# Patient Record
Sex: Female | Born: 1950 | Race: White | Hispanic: No | Smoking: Former smoker
Health system: Southern US, Community
[De-identification: ages and names within clinical notes are randomized; demographics above are authoritative.]

## PROBLEM LIST (undated history)

## (undated) DIAGNOSIS — Z8719 Personal history of other diseases of the digestive system: Secondary | ICD-10-CM

## (undated) DIAGNOSIS — K219 Gastro-esophageal reflux disease without esophagitis: Secondary | ICD-10-CM

## (undated) DIAGNOSIS — J449 Chronic obstructive pulmonary disease, unspecified: Secondary | ICD-10-CM

## (undated) DIAGNOSIS — K2970 Gastritis, unspecified, without bleeding: Secondary | ICD-10-CM

## (undated) DIAGNOSIS — F419 Anxiety disorder, unspecified: Secondary | ICD-10-CM

## (undated) DIAGNOSIS — M1990 Unspecified osteoarthritis, unspecified site: Secondary | ICD-10-CM

## (undated) DIAGNOSIS — G35 Multiple sclerosis: Secondary | ICD-10-CM

## (undated) DIAGNOSIS — G709 Myoneural disorder, unspecified: Secondary | ICD-10-CM

## (undated) DIAGNOSIS — T39395A Adverse effect of other nonsteroidal anti-inflammatory drugs [NSAID], initial encounter: Secondary | ICD-10-CM

## (undated) DIAGNOSIS — E669 Obesity, unspecified: Secondary | ICD-10-CM

## (undated) DIAGNOSIS — I2510 Atherosclerotic heart disease of native coronary artery without angina pectoris: Secondary | ICD-10-CM

## (undated) DIAGNOSIS — E785 Hyperlipidemia, unspecified: Secondary | ICD-10-CM

## (undated) DIAGNOSIS — R490 Dysphonia: Secondary | ICD-10-CM

## (undated) DIAGNOSIS — I714 Abdominal aortic aneurysm, without rupture, unspecified: Secondary | ICD-10-CM

## (undated) DIAGNOSIS — K635 Polyp of colon: Secondary | ICD-10-CM

## (undated) DIAGNOSIS — I4510 Unspecified right bundle-branch block: Secondary | ICD-10-CM

## (undated) DIAGNOSIS — E039 Hypothyroidism, unspecified: Secondary | ICD-10-CM

## (undated) DIAGNOSIS — K259 Gastric ulcer, unspecified as acute or chronic, without hemorrhage or perforation: Secondary | ICD-10-CM

## (undated) DIAGNOSIS — I10 Essential (primary) hypertension: Secondary | ICD-10-CM

## (undated) HISTORY — DX: Dysphonia: R49.0

## (undated) HISTORY — DX: Abdominal aortic aneurysm, without rupture: I71.4

## (undated) HISTORY — DX: Adverse effect of other nonsteroidal anti-inflammatory drugs (NSAID), initial encounter: T39.395A

## (undated) HISTORY — DX: Abdominal aortic aneurysm, without rupture, unspecified: I71.40

## (undated) HISTORY — DX: Gastritis, unspecified, without bleeding: K29.70

## (undated) HISTORY — DX: Hypothyroidism, unspecified: E03.9

## (undated) HISTORY — DX: Polyp of colon: K63.5

## (undated) HISTORY — DX: Multiple sclerosis: G35

## (undated) HISTORY — PX: ABDOMINAL HYSTERECTOMY: SHX81

## (undated) HISTORY — DX: Atherosclerotic heart disease of native coronary artery without angina pectoris: I25.10

## (undated) HISTORY — DX: Essential (primary) hypertension: I10

## (undated) HISTORY — PX: TONSILLECTOMY: SUR1361

## (undated) HISTORY — PX: CATARACT EXTRACTION, BILATERAL: SHX1313

## (undated) HISTORY — PX: CHOLECYSTECTOMY: SHX55

## (undated) HISTORY — DX: Obesity, unspecified: E66.9

## (undated) HISTORY — DX: Hyperlipidemia, unspecified: E78.5

## (undated) HISTORY — PX: CARPAL TUNNEL RELEASE: SHX101

## (undated) HISTORY — DX: Chronic obstructive pulmonary disease, unspecified: J44.9

## (undated) HISTORY — DX: Gastric ulcer, unspecified as acute or chronic, without hemorrhage or perforation: K25.9

---

## 2000-04-30 ENCOUNTER — Ambulatory Visit (HOSPITAL_COMMUNITY): Admission: RE | Admit: 2000-04-30 | Discharge: 2000-04-30 | Payer: Self-pay | Admitting: Gastroenterology

## 2001-04-30 ENCOUNTER — Ambulatory Visit (HOSPITAL_BASED_OUTPATIENT_CLINIC_OR_DEPARTMENT_OTHER): Admission: RE | Admit: 2001-04-30 | Discharge: 2001-04-30 | Payer: Self-pay | Admitting: Orthopedic Surgery

## 2001-05-21 ENCOUNTER — Ambulatory Visit (HOSPITAL_BASED_OUTPATIENT_CLINIC_OR_DEPARTMENT_OTHER): Admission: RE | Admit: 2001-05-21 | Discharge: 2001-05-21 | Payer: Self-pay | Admitting: Orthopedic Surgery

## 2002-02-12 ENCOUNTER — Encounter: Payer: Self-pay | Admitting: Family Medicine

## 2002-02-12 ENCOUNTER — Encounter: Admission: RE | Admit: 2002-02-12 | Discharge: 2002-02-12 | Payer: Self-pay | Admitting: Family Medicine

## 2002-03-14 ENCOUNTER — Other Ambulatory Visit: Admission: RE | Admit: 2002-03-14 | Discharge: 2002-03-14 | Payer: Self-pay | Admitting: Family Medicine

## 2002-12-02 ENCOUNTER — Encounter: Payer: Self-pay | Admitting: Family Medicine

## 2002-12-02 ENCOUNTER — Encounter: Admission: RE | Admit: 2002-12-02 | Discharge: 2002-12-02 | Payer: Self-pay | Admitting: Family Medicine

## 2003-07-25 ENCOUNTER — Emergency Department (HOSPITAL_COMMUNITY): Admission: EM | Admit: 2003-07-25 | Discharge: 2003-07-25 | Payer: Self-pay | Admitting: Family Medicine

## 2003-08-16 ENCOUNTER — Emergency Department (HOSPITAL_COMMUNITY): Admission: AD | Admit: 2003-08-16 | Discharge: 2003-08-16 | Payer: Self-pay | Admitting: Family Medicine

## 2004-04-10 ENCOUNTER — Encounter: Admission: RE | Admit: 2004-04-10 | Discharge: 2004-04-10 | Payer: Self-pay | Admitting: Neurology

## 2004-04-27 ENCOUNTER — Ambulatory Visit (HOSPITAL_COMMUNITY): Admission: RE | Admit: 2004-04-27 | Discharge: 2004-04-27 | Payer: Self-pay | Admitting: Neurology

## 2004-05-20 ENCOUNTER — Other Ambulatory Visit: Admission: RE | Admit: 2004-05-20 | Discharge: 2004-05-20 | Payer: Self-pay | Admitting: Family Medicine

## 2005-02-25 ENCOUNTER — Emergency Department (HOSPITAL_COMMUNITY): Admission: EM | Admit: 2005-02-25 | Discharge: 2005-02-25 | Payer: Self-pay | Admitting: Emergency Medicine

## 2005-09-29 ENCOUNTER — Other Ambulatory Visit: Admission: RE | Admit: 2005-09-29 | Discharge: 2005-09-29 | Payer: Self-pay | Admitting: Family Medicine

## 2006-07-18 LAB — CONVERTED CEMR LAB: Pap Smear: NORMAL

## 2007-06-25 DIAGNOSIS — Z860100 Personal history of colon polyps, unspecified: Secondary | ICD-10-CM | POA: Insufficient documentation

## 2007-06-25 DIAGNOSIS — F418 Other specified anxiety disorders: Secondary | ICD-10-CM

## 2007-06-25 DIAGNOSIS — K259 Gastric ulcer, unspecified as acute or chronic, without hemorrhage or perforation: Secondary | ICD-10-CM | POA: Insufficient documentation

## 2007-06-25 DIAGNOSIS — D126 Benign neoplasm of colon, unspecified: Secondary | ICD-10-CM | POA: Insufficient documentation

## 2007-06-25 DIAGNOSIS — E785 Hyperlipidemia, unspecified: Secondary | ICD-10-CM | POA: Insufficient documentation

## 2007-06-26 ENCOUNTER — Ambulatory Visit: Payer: Self-pay | Admitting: Internal Medicine

## 2007-06-26 DIAGNOSIS — Z87891 Personal history of nicotine dependence: Secondary | ICD-10-CM

## 2007-06-26 DIAGNOSIS — R0989 Other specified symptoms and signs involving the circulatory and respiratory systems: Secondary | ICD-10-CM

## 2007-06-26 DIAGNOSIS — J309 Allergic rhinitis, unspecified: Secondary | ICD-10-CM

## 2007-06-26 DIAGNOSIS — R498 Other voice and resonance disorders: Secondary | ICD-10-CM

## 2007-06-26 DIAGNOSIS — R0609 Other forms of dyspnea: Secondary | ICD-10-CM | POA: Insufficient documentation

## 2007-06-26 DIAGNOSIS — R05 Cough: Secondary | ICD-10-CM

## 2007-06-26 DIAGNOSIS — R062 Wheezing: Secondary | ICD-10-CM

## 2007-07-23 ENCOUNTER — Ambulatory Visit: Payer: Self-pay | Admitting: Internal Medicine

## 2007-07-23 DIAGNOSIS — J209 Acute bronchitis, unspecified: Secondary | ICD-10-CM

## 2007-07-23 DIAGNOSIS — J440 Chronic obstructive pulmonary disease with acute lower respiratory infection: Secondary | ICD-10-CM

## 2007-07-23 DIAGNOSIS — R942 Abnormal results of pulmonary function studies: Secondary | ICD-10-CM | POA: Insufficient documentation

## 2007-07-23 LAB — CONVERTED CEMR LAB: A-1 Antitrypsin, Ser: 184 mg/dL (ref 83–200)

## 2007-08-01 ENCOUNTER — Telehealth (INDEPENDENT_AMBULATORY_CARE_PROVIDER_SITE_OTHER): Payer: Self-pay | Admitting: *Deleted

## 2007-08-26 ENCOUNTER — Ambulatory Visit: Payer: Self-pay | Admitting: Internal Medicine

## 2007-08-28 ENCOUNTER — Encounter: Payer: Self-pay | Admitting: Internal Medicine

## 2007-10-02 ENCOUNTER — Ambulatory Visit: Payer: Self-pay | Admitting: Internal Medicine

## 2007-11-26 ENCOUNTER — Ambulatory Visit: Payer: Self-pay | Admitting: Internal Medicine

## 2007-12-30 ENCOUNTER — Ambulatory Visit: Payer: Self-pay | Admitting: Internal Medicine

## 2008-01-27 ENCOUNTER — Ambulatory Visit: Payer: Self-pay | Admitting: Internal Medicine

## 2008-02-05 ENCOUNTER — Encounter: Payer: Self-pay | Admitting: Internal Medicine

## 2008-02-26 ENCOUNTER — Encounter: Payer: Self-pay | Admitting: Family Medicine

## 2008-02-26 ENCOUNTER — Ambulatory Visit (HOSPITAL_COMMUNITY): Admission: RE | Admit: 2008-02-26 | Discharge: 2008-02-26 | Payer: Self-pay | Admitting: Surgery

## 2008-02-27 ENCOUNTER — Ambulatory Visit (HOSPITAL_COMMUNITY): Admission: RE | Admit: 2008-02-27 | Discharge: 2008-02-27 | Payer: Self-pay | Admitting: Surgery

## 2008-02-27 ENCOUNTER — Encounter: Payer: Self-pay | Admitting: Family Medicine

## 2008-03-07 ENCOUNTER — Ambulatory Visit (HOSPITAL_BASED_OUTPATIENT_CLINIC_OR_DEPARTMENT_OTHER): Admission: RE | Admit: 2008-03-07 | Discharge: 2008-03-07 | Payer: Self-pay | Admitting: Surgery

## 2008-03-12 ENCOUNTER — Encounter: Admission: RE | Admit: 2008-03-12 | Discharge: 2008-03-12 | Payer: Self-pay | Admitting: Surgery

## 2008-03-14 ENCOUNTER — Ambulatory Visit: Payer: Self-pay | Admitting: Internal Medicine

## 2008-05-08 ENCOUNTER — Encounter: Payer: Self-pay | Admitting: Family Medicine

## 2008-06-22 ENCOUNTER — Encounter: Admission: RE | Admit: 2008-06-22 | Discharge: 2008-06-22 | Payer: Self-pay | Admitting: Family Medicine

## 2008-07-20 ENCOUNTER — Ambulatory Visit: Payer: Self-pay | Admitting: Family Medicine

## 2008-07-20 DIAGNOSIS — K449 Diaphragmatic hernia without obstruction or gangrene: Secondary | ICD-10-CM | POA: Insufficient documentation

## 2008-07-20 DIAGNOSIS — E039 Hypothyroidism, unspecified: Secondary | ICD-10-CM

## 2008-08-20 ENCOUNTER — Encounter: Admission: RE | Admit: 2008-08-20 | Discharge: 2008-11-18 | Payer: Self-pay | Admitting: Surgery

## 2008-09-03 ENCOUNTER — Encounter: Payer: Self-pay | Admitting: Family Medicine

## 2008-09-07 ENCOUNTER — Ambulatory Visit (HOSPITAL_COMMUNITY): Admission: RE | Admit: 2008-09-07 | Discharge: 2008-09-08 | Payer: Self-pay | Admitting: Surgery

## 2008-09-07 ENCOUNTER — Encounter: Payer: Self-pay | Admitting: Family Medicine

## 2008-09-07 HISTORY — PX: BARIATRIC SURGERY: SHX1103

## 2008-09-07 HISTORY — PX: HIATAL HERNIA REPAIR: SHX195

## 2008-09-15 ENCOUNTER — Ambulatory Visit: Payer: Self-pay | Admitting: Family Medicine

## 2008-09-15 DIAGNOSIS — M25519 Pain in unspecified shoulder: Secondary | ICD-10-CM

## 2008-09-15 DIAGNOSIS — Z9884 Bariatric surgery status: Secondary | ICD-10-CM

## 2008-09-15 DIAGNOSIS — D696 Thrombocytopenia, unspecified: Secondary | ICD-10-CM

## 2008-09-15 LAB — CONVERTED CEMR LAB
Basophils Absolute: 0.1 10*3/uL (ref 0.0–0.1)
Basophils Relative: 0.7 % (ref 0.0–3.0)
Eosinophils Absolute: 0.3 10*3/uL (ref 0.0–0.7)
Eosinophils Relative: 3.3 % (ref 0.0–5.0)
Free T4: 1 ng/dL (ref 0.6–1.6)
HCT: 41.4 % (ref 36.0–46.0)
Hemoglobin: 14.6 g/dL (ref 12.0–15.0)
Lymphocytes Relative: 31.9 % (ref 12.0–46.0)
Lymphs Abs: 2.6 10*3/uL (ref 0.7–4.0)
MCHC: 35.3 g/dL (ref 30.0–36.0)
MCV: 86.1 fL (ref 78.0–100.0)
Monocytes Absolute: 0.9 10*3/uL (ref 0.1–1.0)
Monocytes Relative: 11.6 % (ref 3.0–12.0)
Neutro Abs: 4.2 10*3/uL (ref 1.4–7.7)
Neutrophils Relative %: 52.5 % (ref 43.0–77.0)
Platelets: 142 10*3/uL — ABNORMAL LOW (ref 150.0–400.0)
RBC: 4.81 M/uL (ref 3.87–5.11)
RDW: 12.5 % (ref 11.5–14.6)
T3, Free: 2.7 pg/mL (ref 2.3–4.2)
TSH: 1.4 microintl units/mL (ref 0.35–5.50)
WBC: 8.1 10*3/uL (ref 4.5–10.5)

## 2008-09-16 ENCOUNTER — Encounter (INDEPENDENT_AMBULATORY_CARE_PROVIDER_SITE_OTHER): Payer: Self-pay | Admitting: *Deleted

## 2008-09-18 ENCOUNTER — Ambulatory Visit: Payer: Self-pay | Admitting: Internal Medicine

## 2008-10-05 ENCOUNTER — Encounter: Admission: RE | Admit: 2008-10-05 | Discharge: 2008-10-05 | Payer: Self-pay | Admitting: Surgery

## 2008-11-12 ENCOUNTER — Telehealth (INDEPENDENT_AMBULATORY_CARE_PROVIDER_SITE_OTHER): Payer: Self-pay | Admitting: *Deleted

## 2008-11-19 ENCOUNTER — Encounter: Payer: Self-pay | Admitting: Family Medicine

## 2008-12-08 ENCOUNTER — Encounter: Admission: RE | Admit: 2008-12-08 | Discharge: 2008-12-08 | Payer: Self-pay | Admitting: Surgery

## 2008-12-16 ENCOUNTER — Ambulatory Visit: Payer: Self-pay | Admitting: Family Medicine

## 2008-12-16 LAB — CONVERTED CEMR LAB
Bilirubin Urine: NEGATIVE
Blood in Urine, dipstick: NEGATIVE
Glucose, Urine, Semiquant: NEGATIVE
Ketones, urine, test strip: NEGATIVE
Nitrite: NEGATIVE
Protein, U semiquant: NEGATIVE
Specific Gravity, Urine: <1.005
Urobilinogen, UA: 0.2
WBC Urine, dipstick: NEGATIVE
pH: 7.5

## 2008-12-24 LAB — CONVERTED CEMR LAB
ALT: 27 units/L (ref 0–35)
AST: 27 units/L (ref 0–37)
Albumin: 3.8 g/dL (ref 3.5–5.2)
Alkaline Phosphatase: 67 units/L (ref 39–117)
BUN: 10 mg/dL (ref 6–23)
Basophils Absolute: 0 10*3/uL (ref 0.0–0.1)
Basophils Relative: 0.3 % (ref 0.0–3.0)
Bilirubin, Direct: 0.1 mg/dL (ref 0.0–0.3)
CO2: 32 meq/L (ref 19–32)
Calcium: 9.3 mg/dL (ref 8.4–10.5)
Chloride: 103 meq/L (ref 96–112)
Cholesterol: 138 mg/dL (ref 0–200)
Creatinine, Ser: 0.8 mg/dL (ref 0.4–1.2)
Eosinophils Absolute: 0.2 10*3/uL (ref 0.0–0.7)
Eosinophils Relative: 2.5 % (ref 0.0–5.0)
GFR calc non Af Amer: 78.07 mL/min (ref >60)
Glucose, Bld: 104 mg/dL — ABNORMAL HIGH (ref 70–99)
HCT: 41.8 % (ref 36.0–46.0)
HDL: 32.7 mg/dL — ABNORMAL LOW (ref >39.00)
Hemoglobin: 14.1 g/dL (ref 12.0–15.0)
LDL Cholesterol: 84 mg/dL (ref 0–99)
Lymphocytes Relative: 37.7 % (ref 12.0–46.0)
Lymphs Abs: 2.3 10*3/uL (ref 0.7–4.0)
MCHC: 33.8 g/dL (ref 30.0–36.0)
MCV: 87 fL (ref 78.0–100.0)
Monocytes Absolute: 0.5 10*3/uL (ref 0.1–1.0)
Monocytes Relative: 8.8 % (ref 3.0–12.0)
Neutro Abs: 3.1 10*3/uL (ref 1.4–7.7)
Neutrophils Relative %: 50.7 % (ref 43.0–77.0)
Platelets: 101 10*3/uL — ABNORMAL LOW (ref 150.0–400.0)
Potassium: 4 meq/L (ref 3.5–5.1)
RBC: 4.81 M/uL (ref 3.87–5.11)
RDW: 13.5 % (ref 11.5–14.6)
Sodium: 141 meq/L (ref 135–145)
TSH: 0.09 microintl units/mL — ABNORMAL LOW (ref 0.35–5.50)
Total Bilirubin: 0.7 mg/dL (ref 0.3–1.2)
Total CHOL/HDL Ratio: 4
Total Protein: 7.2 g/dL (ref 6.0–8.3)
Triglycerides: 109 mg/dL (ref 0.0–149.0)
VLDL: 21.8 mg/dL (ref 0.0–40.0)
WBC: 6.1 10*3/uL (ref 4.5–10.5)

## 2008-12-28 ENCOUNTER — Telehealth (INDEPENDENT_AMBULATORY_CARE_PROVIDER_SITE_OTHER): Payer: Self-pay | Admitting: *Deleted

## 2009-01-04 ENCOUNTER — Ambulatory Visit: Payer: Self-pay | Admitting: Family Medicine

## 2009-01-04 DIAGNOSIS — G35 Multiple sclerosis: Secondary | ICD-10-CM

## 2009-01-04 LAB — CONVERTED CEMR LAB
Basophils Absolute: 0.1 10*3/uL (ref 0.0–0.1)
Basophils Relative: 0.9 % (ref 0.0–3.0)
Eosinophils Absolute: 0.2 10*3/uL (ref 0.0–0.7)
Eosinophils Relative: 2.2 % (ref 0.0–5.0)
HCT: 39.7 % (ref 36.0–46.0)
Hemoglobin: 13.6 g/dL (ref 12.0–15.0)
Lymphocytes Relative: 31.1 % (ref 12.0–46.0)
Lymphs Abs: 2.3 10*3/uL (ref 0.7–4.0)
MCHC: 34.3 g/dL (ref 30.0–36.0)
MCV: 85.8 fL (ref 78.0–100.0)
Monocytes Absolute: 0.5 10*3/uL (ref 0.1–1.0)
Monocytes Relative: 7.3 % (ref 3.0–12.0)
Neutro Abs: 4.4 10*3/uL (ref 1.4–7.7)
Neutrophils Relative %: 58.5 % (ref 43.0–77.0)
Platelets: 110 10*3/uL — ABNORMAL LOW (ref 150.0–400.0)
RBC: 4.63 M/uL (ref 3.87–5.11)
RDW: 13.7 % (ref 11.5–14.6)
WBC: 7.5 10*3/uL (ref 4.5–10.5)

## 2009-01-12 ENCOUNTER — Telehealth (INDEPENDENT_AMBULATORY_CARE_PROVIDER_SITE_OTHER): Payer: Self-pay | Admitting: *Deleted

## 2009-02-04 ENCOUNTER — Ambulatory Visit: Payer: Self-pay | Admitting: Family Medicine

## 2009-02-09 ENCOUNTER — Encounter (INDEPENDENT_AMBULATORY_CARE_PROVIDER_SITE_OTHER): Payer: Self-pay | Admitting: *Deleted

## 2009-02-09 LAB — CONVERTED CEMR LAB
Basophils Absolute: 0.1 10*3/uL (ref 0.0–0.1)
Basophils Relative: 1.1 % (ref 0.0–3.0)
Eosinophils Absolute: 0.2 10*3/uL (ref 0.0–0.7)
Eosinophils Relative: 2.4 % (ref 0.0–5.0)
HCT: 42.4 % (ref 36.0–46.0)
Hemoglobin: 14.1 g/dL (ref 12.0–15.0)
Lymphocytes Relative: 34.1 % (ref 12.0–46.0)
Lymphs Abs: 3.1 10*3/uL (ref 0.7–4.0)
MCHC: 33.3 g/dL (ref 30.0–36.0)
MCV: 87.6 fL (ref 78.0–100.0)
Monocytes Absolute: 0.2 10*3/uL (ref 0.1–1.0)
Monocytes Relative: 2.5 % — ABNORMAL LOW (ref 3.0–12.0)
Neutro Abs: 5.4 10*3/uL (ref 1.4–7.7)
Neutrophils Relative %: 59.9 % (ref 43.0–77.0)
Platelets: 114 10*3/uL — ABNORMAL LOW (ref 150.0–400.0)
RBC: 4.84 M/uL (ref 3.87–5.11)
RDW: 13.2 % (ref 11.5–14.6)
WBC: 9 10*3/uL (ref 4.5–10.5)

## 2009-03-08 ENCOUNTER — Telehealth (INDEPENDENT_AMBULATORY_CARE_PROVIDER_SITE_OTHER): Payer: Self-pay | Admitting: *Deleted

## 2009-03-08 ENCOUNTER — Ambulatory Visit: Payer: Self-pay | Admitting: Family Medicine

## 2009-03-09 LAB — CONVERTED CEMR LAB: TSH: 0.35 microintl units/mL (ref 0.35–5.50)

## 2009-03-10 ENCOUNTER — Encounter: Admission: RE | Admit: 2009-03-10 | Discharge: 2009-03-10 | Payer: Self-pay | Admitting: Surgery

## 2009-03-22 ENCOUNTER — Ambulatory Visit: Payer: Self-pay | Admitting: Internal Medicine

## 2009-03-22 DIAGNOSIS — J019 Acute sinusitis, unspecified: Secondary | ICD-10-CM

## 2009-03-24 LAB — HM MAMMOGRAPHY: HM Mammogram: NORMAL

## 2009-04-06 ENCOUNTER — Ambulatory Visit: Payer: Self-pay | Admitting: Family Medicine

## 2009-04-12 ENCOUNTER — Encounter (INDEPENDENT_AMBULATORY_CARE_PROVIDER_SITE_OTHER): Payer: Self-pay | Admitting: *Deleted

## 2009-04-12 LAB — CONVERTED CEMR LAB
Basophils Absolute: 0.1 10*3/uL (ref 0.0–0.1)
Basophils Relative: 0.8 % (ref 0.0–3.0)
Eosinophils Absolute: 0.1 10*3/uL (ref 0.0–0.7)
Eosinophils Relative: 1.8 % (ref 0.0–5.0)
HCT: 41.1 % (ref 36.0–46.0)
Hemoglobin: 14 g/dL (ref 12.0–15.0)
Lymphocytes Relative: 31.2 % (ref 12.0–46.0)
Lymphs Abs: 2 10*3/uL (ref 0.7–4.0)
MCHC: 34.1 g/dL (ref 30.0–36.0)
MCV: 90.6 fL (ref 78.0–100.0)
Monocytes Absolute: 0.6 10*3/uL (ref 0.1–1.0)
Monocytes Relative: 9.3 % (ref 3.0–12.0)
Neutro Abs: 3.6 10*3/uL (ref 1.4–7.7)
Neutrophils Relative %: 56.9 % (ref 43.0–77.0)
Platelets: 120 10*3/uL — ABNORMAL LOW (ref 150.0–400.0)
RBC: 4.54 M/uL (ref 3.87–5.11)
RDW: 13.2 % (ref 11.5–14.6)
WBC: 6.4 10*3/uL (ref 4.5–10.5)

## 2009-05-19 ENCOUNTER — Encounter: Payer: Self-pay | Admitting: Family Medicine

## 2009-05-21 ENCOUNTER — Encounter: Payer: Self-pay | Admitting: Family Medicine

## 2009-06-11 ENCOUNTER — Telehealth: Payer: Self-pay | Admitting: Family Medicine

## 2009-07-07 ENCOUNTER — Encounter: Payer: Self-pay | Admitting: Family Medicine

## 2009-08-30 ENCOUNTER — Ambulatory Visit: Payer: Self-pay | Admitting: Family Medicine

## 2009-11-03 ENCOUNTER — Encounter: Payer: Self-pay | Admitting: Family Medicine

## 2009-12-09 ENCOUNTER — Telehealth: Payer: Self-pay | Admitting: Family Medicine

## 2009-12-13 ENCOUNTER — Telehealth (INDEPENDENT_AMBULATORY_CARE_PROVIDER_SITE_OTHER): Payer: Self-pay | Admitting: *Deleted

## 2009-12-27 ENCOUNTER — Ambulatory Visit: Payer: Self-pay | Admitting: Internal Medicine

## 2010-01-14 ENCOUNTER — Telehealth: Payer: Self-pay | Admitting: Internal Medicine

## 2010-01-17 ENCOUNTER — Ambulatory Visit: Payer: Self-pay | Admitting: Family Medicine

## 2010-01-17 LAB — CONVERTED CEMR LAB
Bilirubin Urine: NEGATIVE
Blood in Urine, dipstick: NEGATIVE
Glucose, Urine, Semiquant: NEGATIVE
Ketones, urine, test strip: NEGATIVE
Nitrite: NEGATIVE
Protein, U semiquant: NEGATIVE
Specific Gravity, Urine: <1.005
Urobilinogen, UA: 0.2
WBC Urine, dipstick: NEGATIVE
pH: 6.5

## 2010-01-18 LAB — CONVERTED CEMR LAB
ALT: 21 units/L (ref 0–35)
AST: 23 units/L (ref 0–37)
Albumin: 4 g/dL (ref 3.5–5.2)
Alkaline Phosphatase: 60 units/L (ref 39–117)
BUN: 14 mg/dL (ref 6–23)
Basophils Absolute: 0.1 10*3/uL (ref 0.0–0.1)
Basophils Relative: 0.8 % (ref 0.0–3.0)
Bilirubin, Direct: 0.1 mg/dL (ref 0.0–0.3)
CO2: 29 meq/L (ref 19–32)
Calcium: 9.5 mg/dL (ref 8.4–10.5)
Chloride: 104 meq/L (ref 96–112)
Cholesterol: 162 mg/dL (ref 0–200)
Creatinine, Ser: 0.8 mg/dL (ref 0.4–1.2)
Eosinophils Absolute: 0.2 10*3/uL (ref 0.0–0.7)
Eosinophils Relative: 3.2 % (ref 0.0–5.0)
GFR calc non Af Amer: 77.78 mL/min (ref >60)
Glucose, Bld: 94 mg/dL (ref 70–99)
HCT: 42.6 % (ref 36.0–46.0)
HDL: 37.8 mg/dL — ABNORMAL LOW (ref >39.00)
Hemoglobin: 14.8 g/dL (ref 12.0–15.0)
LDL Cholesterol: 106 mg/dL — ABNORMAL HIGH (ref 0–99)
Lymphocytes Relative: 35.5 % (ref 12.0–46.0)
Lymphs Abs: 2.3 10*3/uL (ref 0.7–4.0)
MCHC: 34.7 g/dL (ref 30.0–36.0)
MCV: 89.3 fL (ref 78.0–100.0)
Monocytes Absolute: 0.6 10*3/uL (ref 0.1–1.0)
Monocytes Relative: 9.5 % (ref 3.0–12.0)
Neutro Abs: 3.3 10*3/uL (ref 1.4–7.7)
Neutrophils Relative %: 51 % (ref 43.0–77.0)
Platelets: 106 10*3/uL — ABNORMAL LOW (ref 150.0–400.0)
Potassium: 4.7 meq/L (ref 3.5–5.1)
RBC: 4.76 M/uL (ref 3.87–5.11)
RDW: 12.8 % (ref 11.5–14.6)
Sodium: 140 meq/L (ref 135–145)
TSH: 0.62 microintl units/mL (ref 0.35–5.50)
Total Bilirubin: 0.5 mg/dL (ref 0.3–1.2)
Total CHOL/HDL Ratio: 4
Total Protein: 6.9 g/dL (ref 6.0–8.3)
Triglycerides: 91 mg/dL (ref 0.0–149.0)
VLDL: 18.2 mg/dL (ref 0.0–40.0)
WBC: 6.4 10*3/uL (ref 4.5–10.5)

## 2010-01-25 ENCOUNTER — Ambulatory Visit: Payer: Self-pay | Admitting: Family Medicine

## 2010-01-25 LAB — CONVERTED CEMR LAB
Basophils Absolute: 0 10*3/uL (ref 0.0–0.1)
Basophils Relative: 0.5 % (ref 0.0–3.0)
Eosinophils Absolute: 0.1 10*3/uL (ref 0.0–0.7)
Eosinophils Relative: 2.5 % (ref 0.0–5.0)
HCT: 42.4 % (ref 36.0–46.0)
Hemoglobin: 14.5 g/dL (ref 12.0–15.0)
Lymphocytes Relative: 36 % (ref 12.0–46.0)
Lymphs Abs: 2.1 10*3/uL (ref 0.7–4.0)
MCHC: 34.1 g/dL (ref 30.0–36.0)
MCV: 90 fL (ref 78.0–100.0)
Monocytes Absolute: 0.5 10*3/uL (ref 0.1–1.0)
Monocytes Relative: 8.5 % (ref 3.0–12.0)
Neutro Abs: 3 10*3/uL (ref 1.4–7.7)
Neutrophils Relative %: 52.5 % (ref 43.0–77.0)
Platelets: 105 10*3/uL — ABNORMAL LOW (ref 150.0–400.0)
RBC: 4.71 M/uL (ref 3.87–5.11)
RDW: 13 % (ref 11.5–14.6)
WBC: 5.8 10*3/uL (ref 4.5–10.5)

## 2010-01-31 ENCOUNTER — Ambulatory Visit: Payer: Self-pay | Admitting: Internal Medicine

## 2010-02-28 ENCOUNTER — Ambulatory Visit: Payer: Self-pay | Admitting: Family Medicine

## 2010-02-28 LAB — CONVERTED CEMR LAB
Basophils Absolute: 0 10*3/uL (ref 0.0–0.1)
Basophils Relative: 0.3 % (ref 0.0–3.0)
Eosinophils Absolute: 0.2 10*3/uL (ref 0.0–0.7)
Eosinophils Relative: 2.6 % (ref 0.0–5.0)
HCT: 42.9 % (ref 36.0–46.0)
Hemoglobin: 14.7 g/dL (ref 12.0–15.0)
Lymphocytes Relative: 28.4 % (ref 12.0–46.0)
Lymphs Abs: 2 10*3/uL (ref 0.7–4.0)
MCHC: 34.3 g/dL (ref 30.0–36.0)
MCV: 90.1 fL (ref 78.0–100.0)
Monocytes Absolute: 0.5 10*3/uL (ref 0.1–1.0)
Monocytes Relative: 7.4 % (ref 3.0–12.0)
Neutro Abs: 4.4 10*3/uL (ref 1.4–7.7)
Neutrophils Relative %: 61.3 % (ref 43.0–77.0)
Platelets: 108 10*3/uL — ABNORMAL LOW (ref 150.0–400.0)
RBC: 4.76 M/uL (ref 3.87–5.11)
RDW: 12.5 % (ref 11.5–14.6)
WBC: 7.2 10*3/uL (ref 4.5–10.5)

## 2010-03-02 ENCOUNTER — Ambulatory Visit: Payer: Self-pay | Admitting: Hematology & Oncology

## 2010-03-04 ENCOUNTER — Ambulatory Visit: Payer: Self-pay | Admitting: Family Medicine

## 2010-03-09 ENCOUNTER — Encounter: Payer: Self-pay | Admitting: Family Medicine

## 2010-03-10 ENCOUNTER — Ambulatory Visit: Payer: Self-pay | Admitting: Family Medicine

## 2010-03-16 ENCOUNTER — Encounter: Payer: Self-pay | Admitting: Family Medicine

## 2010-03-16 LAB — CBC WITH DIFFERENTIAL (CANCER CENTER ONLY)
BASO%: 0.6 % (ref 0.0–2.0)
EOS%: 3.7 % (ref 0.0–7.0)
HCT: 43.6 % (ref 34.8–46.6)
LYMPH#: 2.8 10*3/uL (ref 0.9–3.3)
MCHC: 33.8 g/dL (ref 32.0–36.0)
MONO#: 0.6 10*3/uL (ref 0.1–0.9)
NEUT%: 46.5 % (ref 39.6–80.0)
Platelets: ADEQUATE 10*3/uL (ref 145–400)
RDW: 11.3 % (ref 10.5–14.6)
WBC: 6.8 10*3/uL (ref 3.9–10.0)

## 2010-03-16 LAB — CHCC SATELLITE - SMEAR

## 2010-03-18 LAB — PROTEIN ELECTROPHORESIS, SERUM
Albumin ELP: 55.7 % — ABNORMAL LOW (ref 55.8–66.1)
Alpha-1-Globulin: 8.1 % — ABNORMAL HIGH (ref 2.9–4.9)
Beta 2: 4.3 % (ref 3.2–6.5)
Beta Globulin: 5.5 % (ref 4.7–7.2)
Gamma Globulin: 14.9 % (ref 11.1–18.8)

## 2010-03-18 LAB — LACTATE DEHYDROGENASE: LDH: 135 U/L (ref 94–250)

## 2010-03-18 LAB — FERRITIN: Ferritin: 72 ng/mL (ref 10–291)

## 2010-03-21 ENCOUNTER — Telehealth (INDEPENDENT_AMBULATORY_CARE_PROVIDER_SITE_OTHER): Payer: Self-pay | Admitting: *Deleted

## 2010-04-11 ENCOUNTER — Telehealth: Payer: Self-pay | Admitting: Internal Medicine

## 2010-05-06 ENCOUNTER — Encounter: Payer: Self-pay | Admitting: Family Medicine

## 2010-05-16 ENCOUNTER — Encounter: Payer: Self-pay | Admitting: Family Medicine

## 2010-06-10 ENCOUNTER — Ambulatory Visit
Admission: RE | Admit: 2010-06-10 | Discharge: 2010-06-10 | Payer: Self-pay | Source: Home / Self Care | Attending: Family Medicine | Admitting: Family Medicine

## 2010-06-27 ENCOUNTER — Encounter: Payer: Self-pay | Admitting: Surgery

## 2010-07-04 ENCOUNTER — Ambulatory Visit (HOSPITAL_BASED_OUTPATIENT_CLINIC_OR_DEPARTMENT_OTHER): Payer: Medicare HMO | Admitting: Hematology & Oncology

## 2010-07-05 NOTE — Letter (Signed)
Summary: Metabolic Syndrome Program Form/[employer]  Metabolic Syndrome Program Form/[employer]   Imported By: Lanelle Bal 03/16/2010 12:39:17  _____________________________________________________________________  External Attachment:    Type:   Image     Comment:   External Document

## 2010-07-05 NOTE — Assessment & Plan Note (Signed)
Summary: [facility] - LAB CBCD WITH PERIPHERAL SMEAR:288.8/CBS  Nurse Visit   Allergies: 1)  ! Codeine 2)  ! Astramorph (Morphine Sulfate) 3)  ! * Mycins 4)  ! Ibuprofen 5)  * Dilaudid

## 2010-07-05 NOTE — Letter (Signed)
Summary: Generic Letter  [facility] [place]  [address] [place], [place] [address]   Phone: [phone]  Fax: [phone]    08/30/2009  RE: Cassandra Rios [address] [place], [place]  [address]  To Whom It May Concern:  The above patient is clear to exercise in your facility. Please call with any further questions or concerns.           Sincerely,   Loreen Freud DO

## 2010-07-05 NOTE — Progress Notes (Signed)
Summary: PLATELET LAB PAPERWORK; QUESTION ABOUT THYROID MED  Phone Note Call from Patient Call back at WORK = [phone]   Caller: Patient Summary of Call: 1)   PATIENT BROUGHT IN COPY OF HER [facility] LABS--SHOWS PLATELETS WENT FROM AUGUST = 110 TO DECEMBER = 126 WILL TAKE THIS COPY TO [facility] [place]  2)   ONLY HAS 2 WEEKS LEFT OF THYROID MEDICATION ---DOES DR LOWNE WANT HER TO HAVE MORE LABS BECAUSE SHE WAS GIVEN A PRESCRIPTION FOR 4 MONTHS WITH NO REFILL OR CAN SHE GET A NEW PRESCRIPTION----SHE WILL NEED 90 DAY PRESCRIPTION WITH REFILLS FOR [facility] ON [address]  Initial call taken by: Jerolyn Shin,  June 11, 2009 12:25 PM  Follow-up for Phone Call        ok to refill thyroid until october Follow-up by: Loreen Freud DO,  June 11, 2009 1:24 PM  Additional Follow-up for Phone Call Additional follow up Details #1::        Do you want her labs checked before october?  Additional Follow-up by: Army Fossa CMA,  June 11, 2009 1:42 PM    Additional Follow-up for Phone Call Additional follow up Details #2::    WE WOULD CHECK IN [place]. Follow-up by: Loreen Freud DO,  June 11, 2009 1:53 PM  Additional Follow-up for Phone Call Additional follow up Details #3:: Details for Additional Follow-up Action Taken: pt is aware. Army Fossa CMA  June 11, 2009 2:31 PM  Prescriptions: SYNTHROID 112 MCG TABS (LEVOTHYROXINE SODIUM) 1 by mouth once daily  #90 x 3   Entered by:   Army Fossa CMA   Authorized by:   Loreen Freud DO   Signed by:   Army Fossa CMA on 06/11/2009   Method used:   Electronically to        [facility] [phone]* (retail)       [address]       [place], [place]  [address]       Ph: [phone]       Fax: [phone]   RxID:   [id number]

## 2010-07-05 NOTE — Assessment & Plan Note (Signed)
Summary: flu shot//kn  Nurse Visit Flu Vaccine Consent Questions     Do you have a history of severe allergic reactions to this vaccine? no    Any prior history of allergic reactions to egg and/or gelatin? no    Do you have a sensitivity to the preservative Thimersol? no    Do you have a past history of Guillan-Barre Syndrome? no    Do you currently have an acute febrile illness? no    Have you ever had a severe reaction to latex? no    Vaccine information given and explained to patient? yes    Are you currently pregnant? no    Lot Number:[id number]   Exp Date:12/03/2010   Site Given  Left Deltoid IM    Allergies: 1)  ! Codeine 2)  ! Astramorph (Morphine Sulfate) 3)  ! * Mycins 4)  ! Ibuprofen 5)  * Dilaudid  Orders Added: 1)  Admin 1st Vaccine [90471] 2)  Flu Vaccine 58yrs + [23557]

## 2010-07-05 NOTE — Assessment & Plan Note (Signed)
Summary: cpx/cbs   Vital Signs:  Patient profile:   60 year old female Height:      65 inches Weight:      233 pounds BMI:     38.91 Temp:     98.4 degrees F oral BP sitting:   120 / 70  (left arm)  Vitals Entered By: Almeta Monas CMA Duncan Dull) (January 25, 2010 8:35 AM) CC: cpx, no pap needed / pt is not fasting and concerned about her platelets.   History of Present Illness: Pt here for cpe. Labs were reviewed with pt---pt is concerned about her platlets and wonders if it isthe crestor since that is when it all started. No other complaints.   Pt still followed by Dr Sandria Manly for MS--and is on Betaseron.    Preventive Screening-Counseling & Management  Alcohol-Tobacco     Alcohol drinks/day: 0     Smoking Status: quit     Year Started: 1968     Year Quit: 2008     Pack years: 40 years 2 packs     Passive Smoke Exposure: yes  Caffeine-Diet-Exercise     Caffeine use/day: 1     Diet Comments: s/p lap band surgery     Does Patient Exercise: yes     Type of exercise: walking, gym     Exercise (avg: min/session): 30-60     Times/week: 3  Hep-HIV-STD-Contraception     HIV Risk: no     Dental Visit-last 6 months yes     Dental Care Counseling: not indicated; dental care within six months     SBE monthly: yes     SBE Education/Counseling: not indicated; SBE done regularly     Sun Exposure-Excessive: no  Safety-Violence-Falls     Seat Belt Use: yes     Firearms in the Home: firearms in the home     Smoke Detectors: yes     Violence in the Home: no risk noted     Sexual Abuse: no     Fall Risk: no      Sexual History:  currently monogamous.    Current Medications (verified): 1)  Betaseron 0.3 Mg  Solr (Interferon Beta-1b) .Marland Kitchen.. 1 Injection Every Other Night 2)  Crestor 5 Mg  Tabs (Rosuvastatin Calcium) .... Once Every Other Day 3)  Synthroid 112 Mcg Tabs (Levothyroxine Sodium) .Marland Kitchen.. 1 By Mouth Once Daily 4)  Hydrochlorothiazide 25 Mg  Tabs (Hydrochlorothiazide) .... Once  Daily 5)  Advair Hfa 45-21 Mcg/act  Aero (Fluticasone-Salmeterol) .... 2 Puffs Two Times A Day 6)  Tylenol Extra Strength 1000 Mg/3ml Liqd (Acetaminophen) .... As Directed 7)  Multivitamins  Tabs (Multiple Vitamin) .... Take 2 Tablet By Mouth Once A Day 8)  Chewable Fiber .... Take 1 Once Daily 9)  Prozac 20 Mg Caps (Fluoxetine Hcl) .Marland Kitchen.. 1 By Mouth Once Daily 10)  Caltrate 600+d Plus 600-400 Mg-Unit Chew (Calcium Carbonate-Vit D-Min) .... 3 Tabs Once Daily 11)  Pt Is Able To Exercise  Allergies (verified): 1)  ! Codeine 2)  ! Astramorph (Morphine Sulfate) 3)  ! * Mycins 4)  ! 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   Risk Factors: Alcohol  Use: 0 (01/25/2010) Caffeine Use: 1 (01/25/2010) Diet: s/p lap band surgery (01/25/2010) Exercise: yes (01/25/2010)  Risk Factors: Smoking Status: quit (01/25/2010) Passive Smoke Exposure: yes (01/25/2010)  Past Medical History: #Multiple Sclerosis - dx 3 years ago. Intermittent. Currently stable. No exacerbations. Taking Beta-Interferon. Dr. Avie Echevaria #Obesity Morbid (BMI 44) -  - sp banding at [facility] 08/2008 #Allergic Rhinitis I#buprofen induced ulcer -  #Colonic polyps - q3 year colnoscopies. Dr. Leary Roca at [facility] #COPD ..............Marland KitchenRamaswamy COPD -  Dx made in 2009 based on smoking hx and pft showing Fev1 1.78L/71%, fef 25-75 45%, dlco 53% - mixed obstn/restn on spiro with low dlco. Refused spiriva. On advair therapy. Prior desaturatioin 08/2007 with exertion. No desatuaration on exercise - 11/26/2007. Desaturated to 91% only on 01/27/2008. Improvement in  office spirometry to to an Fev1 of 2.08L/77% in 10/02/2007. 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 COPD:  sister and dad C V A / Stroke: mom  Social History: Reviewed history from 06/26/2007 and no changes required. 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 Fall Risk:  no  Review of Systems      See HPI General:  Denies chills, fatigue, fever, loss of appetite, malaise, sleep disorder, sweats, weakness, and weight loss. Eyes:  Denies blurring, discharge, double vision, eye irritation, eye pain, halos, itching, light sensitivity, red eye, vision loss-1 eye, and vision loss-both eyes; + glasses-- optho q1y. ENT:  Denies decreased hearing, difficulty swallowing, ear discharge, earache, hoarseness, nasal congestion, nosebleeds, postnasal drainage, ringing in ears, sinus pressure, and sore throat. CV:  Denies bluish discoloration of lips or nails, chest pain or discomfort, difficulty breathing at night, difficulty breathing while lying down, fainting, fatigue, leg cramps with exertion, lightheadness, near fainting, palpitations, shortness of breath with exertion, swelling of feet, swelling of hands, and weight gain. Resp:  Denies chest discomfort, chest pain with inspiration, cough, coughing up blood, excessive snoring, hypersomnolence, morning headaches, pleuritic, shortness of breath, sputum productive, and wheezing. GI:  Denies abdominal pain, bloody stools, change in bowel habits, constipation, dark tarry stools, diarrhea, excessive appetite, gas, hemorrhoids, indigestion, loss of appetite, nausea, vomiting, vomiting blood, and yellowish skin color. GU:  Denies abnormal  vaginal bleeding, decreased libido, discharge, dysuria, genital sores, hematuria, incontinence, nocturia, urinary frequency, and urinary hesitancy. MS:  Denies joint pain, joint redness, joint swelling, loss of strength, low back pain, mid back pain, muscle aches, muscle , cramps, muscle weakness, stiffness, and thoracic pain. Derm:  Denies changes in color of skin, changes in nail beds, dryness, excessive perspiration, flushing, hair loss, insect bite(s), itching, lesion(s), poor wound healing, and rash. Neuro:  Denies brief paralysis, difficulty with concentration, disturbances in coordination, falling down, headaches, inability to speak, memory loss, numbness, poor balance, seizures, sensation of room spinning, tingling, tremors, visual disturbances, and weakness. Psych:  Denies alternate hallucination ( auditory/visual), anxiety, depression, easily angered, easily tearful, irritability, mental problems, panic attacks, sense of great danger, suicidal thoughts/plans, thoughts of violence, unusual visions or sounds, and thoughts /plans of harming others. Endo:  Denies cold intolerance, excessive hunger, excessive thirst, excessive urination, heat intolerance, polyuria, and weight change. Heme:  Denies abnormal bruising, bleeding, enlarge lymph nodes, fevers, pallor, and skin discoloration. Allergy:  Denies hives or rash, itching  eyes, persistent infections, seasonal allergies, and sneezing.  Physical Exam  General:  Well-developed,well-nourished,in no acute distress; alert,appropriate and cooperative throughout examinationoverweight-appearing.   Head:  Normocephalic and atraumatic without obvious abnormalities. No apparent alopecia or balding. Eyes:  pupils equal, pupils round, and pupils reactive to light.   Ears:  External ear exam shows no significant lesions or deformities.  Otoscopic examination reveals clear canals, tympanic membranes are intact bilaterally without bulging, retraction,  inflammation or discharge. Hearing is grossly normal bilaterally. Nose:  External nasal examination shows no deformity or inflammation. Nasal mucosa are pink and moist without lesions or exudates. Mouth:  Oral mucosa and oropharynx without lesions or exudates.  Teeth in good repair. Neck:  No deformities, masses, or tenderness noted.no carotid bruits.   Chest Wall:  No deformities, masses, or tenderness noted. Breasts:  No mass, nodules, thickening, tenderness, bulging, retraction, inflamation, nipple discharge or skin changes noted.   Lungs:  Normal respiratory effort, chest expands symmetrically. Lungs are clear to auscultation, no crackles or wheezes. Heart:  normal rate and no murmur.   Abdomen:  Bowel sounds positive,abdomen soft and non-tender without masses, organomegaly or hernias noted. Msk:  normal ROM, no joint tenderness, no joint swelling, no joint warmth, no redness over joints, no joint deformities, no joint instability, and no crepitation.   Pulses:  R posterior tibial normal, R dorsalis pedis normal, R carotid normal, L posterior tibial normal, L dorsalis pedis normal, and L carotid normal.   Extremities:  No clubbing, cyanosis, edema, or deformity noted with normal full range of motion of all joints.   Neurologic:  No cranial nerve deficits noted. Station and gait are normal. Plantar reflexes are down-going bilaterally. DTRs are symmetrical throughout. Sensory, motor and coordinative functions appear intact. Skin:  Intact without suspicious lesions or rashes Cervical Nodes:  No lymphadenopathy noted Axillary Nodes:  No palpable lymphadenopathy Psych:  Cognition and judgment appear intact. Alert and cooperative with normal attention span and concentration. No apparent delusions, illusions, hallucinations   Impression & Recommendations:  Problem # 1:  PREVENTIVE HEALTH CARE (ICD-V70.0)  labs reviewed with pt ghm utd   Orders: EKG w/ Interpretation (93000)  Problem # 2:   THROMBOCYTOPENIA (ICD-287.5)  Orders: Venipuncture ([address]606) TLB-CBC Platelet - w/Differential (85025-CBCD) EKG w/ Interpretation (93000)  Problem # 3:  MULTIPLE SCLEROSIS, RELAPSING/REMITTING (ICD-340)  per Dr Sandria Manly  Orders: EKG w/ Interpretation (93000)  Problem # 4:  HYPOTHYROIDISM (ICD-244.9)  Her updated medication list for this problem includes:    Synthroid 112 Mcg Tabs (Levothyroxine sodium) .Marland Kitchen... 1 by mouth once daily  Labs Reviewed: TSH: 0.62 (01/17/2010)    Chol: 162 (01/17/2010)   HDL: 37.80 (01/17/2010)   LDL: 106 (01/17/2010)   TG: 91.0 (01/17/2010)  Orders: EKG w/ Interpretation (93000)  Problem # 5:  C O P D (ICD-496)  Her updated medication list for this problem includes:    Advair Hfa 45-21 Mcg/act Aero (Fluticasone-salmeterol) .Marland Kitchen... 2 puffs two times a day  Problem # 6:  HYPERLIPIDEMIA (ICD-272.4)  hold crestor for 2-3 weeks then recheck cbcd  Her updated medication list for this problem includes:    Crestor 5 Mg Tabs (Rosuvastatin calcium) ..... Once every other day  Labs Reviewed: SGOT: 23 (01/17/2010)   SGPT: 21 (01/17/2010)   HDL:37.80 (01/17/2010), 32.70 (12/16/2008)  LDL:106 (01/17/2010), 84 (30/16/0109)  Chol:162 (01/17/2010), 138 (12/16/2008)  Trig:91.0 (01/17/2010), 109.0 (12/16/2008)  Orders: EKG w/ Interpretation (93000)  Complete Medication List: 1)  Betaseron 0.3 Mg Solr (Interferon beta-1b) .Marland KitchenMarland KitchenMarland Kitchen 1  injection every other night 2)  Crestor 5 Mg Tabs (Rosuvastatin calcium) .... Once every other day 3)  Synthroid 112 Mcg Tabs (Levothyroxine sodium) .Marland Kitchen.. 1 by mouth once daily 4)  Hydrochlorothiazide 25 Mg Tabs (Hydrochlorothiazide) .... Once daily 5)  Advair Hfa 45-21 Mcg/act Aero (Fluticasone-salmeterol) .... 2 puffs two times a day 6)  Tylenol Extra Strength 1000 Mg/66ml Liqd (Acetaminophen) .... As directed 7)  Multivitamins Tabs (Multiple vitamin) .... Take 2 tablet by mouth once a day 8)  Chewable Fiber  .... Take 1 once daily 9)   Prozac 20 Mg Caps (Fluoxetine hcl) .Marland Kitchen.. 1 by mouth once daily 10)  Caltrate 600+d Plus 600-400 Mg-unit Chew (Calcium carbonate-vit d-min) .... 3 tabs once daily 11)  Pt Is Able To Exercise   Patient Instructions: 1)  288.8  cbcd with peripheral smear---week she get back from beach 2)  Also schedule shingles vaccine after birthday   EKG  Procedure date:  01/25/2010  Findings:      Normal sinus rhythm with rate of:  60 bpm    Last Mammogram:  Normal Bilateral (05/20/2008 2:47:21 PM) Mammogram Result Date:  03/24/2009 Mammogram Result:  normal Mammogram Next Due:  1 yr

## 2010-07-05 NOTE — Progress Notes (Signed)
Summary: LAB   Phone Note Call from Patient   Summary of Call: PT WOULD LIKE TO COME IN A WEEK BEFORE HER APPT 8-23 TO GET HER LABS NEED ORDERS FOR 8.15 TO BE PUT IN FOR FASTING Initial call taken by: Freddy Jaksch,  December 13, 2009 10:30 AM  Follow-up for Phone Call        Is it a physical?  or just f/u?    272.4  244.9  TSH,  hep, lipid , bmp, cbcd, UA Follow-up by: Loreen Freud DO,  December 13, 2009 12:42 PM  Additional Follow-up for Phone Call Additional follow up Details #1::        it is schedule as a physical............Marland KitchenFelecia Deloach CMA  December 13, 2009 1:12 PM   lab order added to appt .Marland KitchenOkey Regal [place]  December 13, 2009 3:35 PM     Additional Follow-up for Phone Call Additional follow up Details #2::    just add V70.0 to dx  yrlowne 7.11.2011 130p

## 2010-07-05 NOTE — Progress Notes (Signed)
Summary: Rx for exercise  Phone Note Call from Patient Call back at Home Phone [phone] Call back at Work Phone [phone]   Caller: Patient Summary of Call: Patient called and said the [facility] will not accept the letter you wrote for her to be able to exercise there. They need it all written on an actual rx paper. Please call patient and let her know when this is ready for pick-up.   Please send reply to [facility].  Initial call taken by: Harold Barban,  December 09, 2009 5:01 PM  Follow-up for Phone Call        rx printed Follow-up by: Loreen Freud DO,  December 09, 2009 5:15 PM  Additional Follow-up for Phone Call Additional follow up Details #1::        pt aware rx ready for pick-up...........Marland KitchenFelecia Deloach CMA  December 10, 2009 8:24 AM     New/Updated Medications: * PT IS ABLE TO EXERCISE  Prescriptions: PT IS ABLE TO EXERCISE   #0 x 0   Entered and Authorized by:   Loreen Freud DO   Signed by:   Loreen Freud DO on 12/09/2009   Method used:   Print then Give to Patient   RxID:   [id number]

## 2010-07-05 NOTE — Assessment & Plan Note (Signed)
Summary: rov 1-2 months///kp   Visit Type:  IME Initial Copy to:  Self Referred Primary Provider/Referring Provider:  Laury Axon  CC:  Pt here for follow-up. Pt states wheezing has improved. Marland Kitchen  History of Present Illness: Morbidly obese (s/p bandin sugery spring 2010). Gold stage 2 COPD (dx made on smoking hx and pft showing Fev1 1.78L/71%, fef 25-75 45%, dlcpo 53%- mixed obstn/restn on spiro with low dlco). Hx of subjective intolerance to spiriva in March/April 2009. Maintained on advair HFA since summer 2009. Walking desaturation to 91% in office in Aguust 2009   OV 12/27/2009. Ms. Vallo returns for followup of copd. Since lsat visit in October 2010 she has only been using advair intermittetnly/ STates she did not need it. Then in Spring 2011 developed acute sinusitis with wheezing. USed advair at significant levels nos for rescue at that time. Since then improved and is using advair at new baseline level of 12 times per month. STill feels intermittetn wheezing; this was not there last time. In terms of weight loss, she is exercising but feels she has slackened and weight has increase to 234# now from 230# at last visit.  REC:  Restart Advair Check Alpha 1 AT gene - send out to [place] [facility] [place]   OV 01/31/2010: Fu for symptomatic COPD. Last visit we restarted advair. Now she feels back to baseline. Denies dyspnea, wheeze, fever, nausea, vomit, chills, edema, syncope. Compliant with advair.  ALpha ! Antitrypsin gene profile check is normal - MM gene     Preventive Screening-Counseling & Management  Alcohol-Tobacco     Smoking Status: quit     Packs/Day: 1.5     Year Started: 1979     Year Quit: 2008     Pack years: 43.5  Current Medications (verified): 1)  Betaseron 0.3 Mg  Solr (Interferon Beta-1b) .Marland Kitchen.. 1 Injection Every Other Night 2)  Crestor 5 Mg  Tabs (Rosuvastatin Calcium) .... Once Every Other Day 3)  Synthroid 112 Mcg Tabs (Levothyroxine Sodium) .Marland Kitchen.. 1 By Mouth Once Daily 4)   Hydrochlorothiazide 25 Mg  Tabs (Hydrochlorothiazide) .... Once Daily 5)  Advair Hfa 45-21 Mcg/act  Aero (Fluticasone-Salmeterol) .... 2 Puffs Two Times A Day 6)  Tylenol Extra Strength 1000 Mg/86ml Liqd (Acetaminophen) .... As Directed 7)  Multivitamins  Tabs (Multiple Vitamin) .... Take 2 Tablet By Mouth Once A Day 8)  Chewable Fiber .... Take 1 Once Daily 9)  Prozac 20 Mg Caps (Fluoxetine Hcl) .Marland Kitchen.. 1 By Mouth Once Daily 10)  Caltrate 600+d Plus 600-400 Mg-Unit Chew (Calcium Carbonate-Vit D-Min) .... 3 Tabs Once Daily 11)  Pt Is Able To Exercise  Allergies (verified): 1)  ! Codeine 2)  ! Astramorph (Morphine Sulfate) 3)  ! * Mycins 4)  ! Ibuprofen 5)  * Dilaudid  Past History:  Past medical, surgical, family and social histories (including risk factors) reviewed, and no changes noted (except as noted below).  Past Medical History: #Multiple Sclerosis - dx 3 years ago. Intermittent. Currently stable. No exacerbations. Taking Beta-Interferon. Dr. Avie Echevaria #Obesity Morbid (BMI 44) -  - sp banding at [place] 08/2008 #Allergic Rhinitis I#buprofen induced ulcer -  #Colonic polyps - q3 year colnoscopies. Dr. Leary Roca at [place] #COPD MM gene ..............Marland KitchenRamaswamy > -  Dx 2009 based on smoking hx and pft showing Fev1 1.78L/71%, fef 25-75 45%, dlco 53% - mixed obstn/restn on spiro with low dlco. Refused spiriva.  > . Improvement in office spirometry to to an Fev1 of 2.08L/77% in 10/02/2007.  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 hysterectomy gallbladder carpall tunnel bariatric surgery---lap band  09/07/2008 hiatal hernia repair 09/07/2008  Family History: Reviewed history from  06/26/2007 and no changes required. COPD:  sister and dad C V A / Stroke: mom  Social History: Reviewed history from 06/26/2007 and no changes required. 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 Packs/Day:  1.5 Pack years:  43.5  Review of Systems  The patient denies shortness of breath with activity, shortness of breath at rest, productive cough, non-productive cough, coughing up blood, chest pain, irregular heartbeats, acid heartburn, indigestion, loss of appetite, weight change, abdominal pain, difficulty swallowing, sore throat, tooth/dental problems, headaches, nasal congestion/difficulty breathing through nose, sneezing, itching, ear ache, anxiety, depression, hand/feet swelling, joint stiffness or pain, rash, change in color of mucus, and fever.    Vital Signs:  Patient profile:   60 year old female Height:      65 inches Weight:      237.50 pounds BMI:     39.66 O2 Sat:      98 % on Room air Temp:     97.8 degrees F oral Pulse rate:   54 / minute BP sitting:   120 / 70  (right arm) Cuff size:   regular  Vitals Entered By: Carron Curie CMA (January 31, 2010 4:45 PM)  O2 Flow:  Room air CC: Pt here for follow-up. Pt states wheezing has improved.  Comments Medications reviewed with patient Carron Curie CMA  January 31, 2010 4:46 PM Daytime phone number verified with patient.    Physical Exam  General:  well developed, well nourished, in no acute distress.  Head:  normocephalic and atraumatic Eyes:  PERRLA/EOM intact; conjunctiva and sclera clear Ears:  TMs intact and clear with normal canals Nose:  little's are on both side has abrasions + Mouth:  no deformity or lesions Neck:  no masses, thyromegaly, or abnormal cervical nodes Chest Wall:  no deformities noted Lungs:  clear bilaterally to auscultation and percussion Heart:  regular rate and rhythm, S1, S2 without murmurs, rubs, gallops, or clicks Abdomen:  soft.  normal bowel sounds. scars of bariatric surgery + Msk:  no deformity or scoliosis noted with normal posture Pulses:  pulses normal Extremities:  no clubbing, cyanosis, edema, or deformity noted Neurologic:  CN II-XII grossly intact with normal reflexes, coordination, muscle strength and tone Skin:  intact without lesions or rashes Cervical Nodes:  no significant adenopathy Axillary Nodes:  no significant adenopathy Psych:  alert and cooperative; normal mood and affect; normal attention span and concentration   Impression & Recommendations:  Problem # 1:  C O P D (ICD-496) Assessment Improved Asymptomatic now after restarting advair. Exoplained that she should be on advair for a long long time and perhaps permanently. She understands. Alpha 1 AT gene is normal at MM gene. Advised flu shot asap. ROV in 1 year unless she is in exacerbation. Advised to lose weight  Other Orders: Est. Patient Level II (04540)  Patient Instructions: 1)  glad you are better 2)  continue advair as befor 3)  return  in  1 year or sooner if there are problems

## 2010-07-05 NOTE — Progress Notes (Signed)
Summary: Refill Request  Phone Note Refill Request Call back at [phone] Message from:  Pharmacy on March 21, 2010 8:33 AM  Refills Requested: Medication #1:  CRESTOR 5 MG  TABS once every other day   Dosage confirmed as above?Dosage Confirmed   Supply Requested: 3 months   Last Refilled: 01/04/2009   Notes: new rx with them [facility]  Next Appointment Scheduled: none Initial call taken by: Harold Barban,  March 21, 2010 8:33 AM    Prescriptions: CRESTOR 5 MG  TABS (ROSUVASTATIN CALCIUM) once every other day  #45 x 2   Entered by:   Almeta Monas CMA (AAMA)   Authorized by:   Loreen Freud DO   Signed by:   Almeta Monas CMA (AAMA) on 03/21/2010   Method used:   Faxed to ...       [facility] (mail-order)             , [place]         Ph: [phone]       Fax: [phone]   RxID:   [id number]

## 2010-07-05 NOTE — Assessment & Plan Note (Signed)
Summary: sinus infection/kdc   Vital Signs:  Patient profile:   60 year old female Weight:      230 pounds Temp:     98.2 degrees F oral Pulse rate:   68 / minute Pulse rhythm:   regular BP sitting:   124 / 80  (left arm) Cuff size:   regular  Vitals Entered By: Army Fossa CMA (August 30, 2009 11:29 AM) CC: Pt here for sinus infection, HA, chest congestion, nasal congestion, ears hurt, pressure in face. , URI symptoms   History of Present Illness:       This is a 60 year old woman who presents with URI symptoms.  The symptoms began 3 weeks ago.  The patient complains of nasal congestion, purulent nasal discharge, and productive cough, but denies clear nasal discharge, sore throat, dry cough, earache, and sick contacts.  The patient denies fever, low-grade fever (<100.5 degrees), fever of 100.5-103 degrees, fever of 103.1-104 degrees, fever to >104 degrees, stiff neck, dyspnea, wheezing, rash, vomiting, diarrhea, use of an antipyretic, and response to antipyretic.  The patient also reports headache.  The patient denies itchy watery eyes, itchy throat, sneezing, seasonal symptoms, response to antihistamine, muscle aches, and severe fatigue.  The patient denies the following risk factors for Strep sinusitis: unilateral facial pain, unilateral nasal discharge, poor response to decongestant, double sickening, tooth pain, Strep exposure, tender adenopathy, and absence of cough.  B/L sinus pressure and jaw pain.  Current Medications (verified): 1)  Betaseron 0.3 Mg  Solr (Interferon Beta-1b) .Marland Kitchen.. 1 Injection Every Other Night 2)  Crestor 5 Mg  Tabs (Rosuvastatin Calcium) .... Once Every Other Day 3)  Synthroid 112 Mcg Tabs (Levothyroxine Sodium) .Marland Kitchen.. 1 By Mouth Once Daily 4)  Hydrochlorothiazide 25 Mg  Tabs (Hydrochlorothiazide) .... Once Daily 5)  Advair Hfa 45-21 Mcg/act  Aero (Fluticasone-Salmeterol) .... 2 Puffs Two Times A Day 6)  Tylenol Extra Strength 1000 Mg/37ml Liqd (Acetaminophen)  .... As Directed 7)  Multivitamins  Tabs (Multiple Vitamin) .... Take 2 Tablet By Mouth Once A Day 8)  Chewable Fiber .... Take 1 Once Daily 9)  Prozac 20 Mg Caps (Fluoxetine Hcl) .Marland Kitchen.. 1 By Mouth Once Daily 10)  Caltrate 600+d Plus 600-400 Mg-Unit Chew (Calcium Carbonate-Vit D-Min) .... 3 Tabs Once Daily 11)  Ceftin 500 Mg Tabs (Cefuroxime Axetil) .Marland Kitchen.. 1 By Mouth Two Times A Day 12)  Omnaris 50 Mcg/act Susp (Ciclesonide) .... 2 Sprays Each Nostril Once Daily  Allergies: 1)  ! Codeine 2)  ! Astramorph (Morphine Sulfate) 3)  ! * Mycins 4)  ! Ibuprofen 5)  * Dilaudid  Past History:  Past medical, surgical, family and social histories (including risk factors) reviewed for relevance to current acute and chronic problems.  Past Medical History: Reviewed history from 09/18/2008 and no changes required. #Multiple Sclerosis - dixed 3 years ago. Intermittent. Currently stable. No exacerbations. Taking Beta-Interferon. Dr. Avie Echevaria #Obesity Morbid (BMI 44) -  - sp banding at [place] 08/2008 #Allergic Rhinitis I#buprofen induced ulcer -  #Colonic polyps - q3 year colnoscopies. Dr. Leary Roca at [place] #COPD ..............Marland KitchenRamaswamy COPD -  Dx made in 2009 based on smoking hx and pft showing Fev1 1.78L/71%, fef 25-75 45%, dlco 53% - mixed obstn/restn on spiro with low dlco. Refused spiriva. On advair therapy. Prior desaturatioin 08/2007 with exertion. No desatuaration on exercise - 11/26/2007. Desaturated to 91% only on 01/27/2008. Improvement in office spirometry to to an Fev1 of 2.08L/77% in 10/02/2007. 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 hysterectomy gallbladder carpall tunnel bariatric  surgery---lap band  09/07/2008 hiatal hernia repair 09/07/2008  Family History: Reviewed history from 06/26/2007 and no changes required. COPD:  sister and dad C V A / Stroke: mom  Social History: Reviewed history from 06/26/2007 and no changes required. 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   Review of Systems      See HPI  Physical Exam  General:  Well-developed,well-nourished,in no acute distress; alert,appropriate and cooperative throughout examination Ears:  External ear exam shows no significant lesions or deformities.  Otoscopic examination reveals clear canals, tympanic membranes are intact bilaterally without bulging, retraction, inflammation or discharge. Hearing is grossly normal bilaterally. Nose:  L frontal sinus tenderness, L maxillary sinus tenderness, R frontal sinus tenderness, and R maxillary sinus tenderness.   Mouth:  Oral mucosa and oropharynx without lesions or exudates.  Teeth in good repair. Neck:  cervical lymphadenopathy.   Lungs:  Normal respiratory effort, chest expands symmetrically. Lungs are clear to auscultation, no crackles or wheezes. Heart:  normal rate and no murmur.   Psych:  Oriented X3 and normally interactive.     Impression & Recommendations:  Problem # 1:  SINUSITIS - ACUTE-NOS (ICD-461.9)  Instructed on treatment. Call if symptoms persist or worsen.   Her updated medication list for this problem includes:    Ceftin 500 Mg Tabs (Cefuroxime axetil) .Marland Kitchen... 1 by mouth two times a day    Omnaris 50 Mcg/act Susp (Ciclesonide) .Marland Kitchen... 2 sprays each nostril once daily    Con't astepro  call or rto as needed   Complete Medication List: 1)  Betaseron 0.3 Mg Solr (Interferon beta-1b) .Marland Kitchen.. 1 injection every other night 2)  Crestor 5 Mg Tabs (Rosuvastatin calcium) .... Once every other day 3)  Synthroid 112 Mcg Tabs (Levothyroxine sodium) .Marland Kitchen.. 1 by mouth once daily 4)  Hydrochlorothiazide 25 Mg Tabs  (Hydrochlorothiazide) .... Once daily 5)  Advair Hfa 45-21 Mcg/act Aero (Fluticasone-salmeterol) .... 2 puffs two times a day 6)  Tylenol Extra Strength 1000 Mg/9ml Liqd (Acetaminophen) .... As directed 7)  Multivitamins Tabs (Multiple vitamin) .... Take 2 tablet by mouth once a day 8)  Chewable Fiber  .... Take 1 once daily 9)  Prozac 20 Mg Caps (Fluoxetine hcl) .Marland Kitchen.. 1 by mouth once daily 10)  Caltrate 600+d Plus 600-400 Mg-unit Chew (Calcium carbonate-vit d-min) .... 3 tabs once daily 11)  Ceftin 500 Mg Tabs (Cefuroxime axetil) .Marland Kitchen.. 1 by mouth two times a day 12)  Omnaris 50 Mcg/act Susp (Ciclesonide) .... 2 sprays each nostril once daily Prescriptions: CEFTIN 500 MG TABS (CEFUROXIME AXETIL) 1 by mouth two times a day  #20 x 0   Entered and Authorized by:   Loreen Freud DO   Signed by:   Loreen Freud DO on 08/30/2009   Method used:   Electronically to        [facility] [phone]* (retail)       [address]       [place], [place]  [address]       Ph: [phone]       Fax: [phone]   RxID:   [id number]

## 2010-07-05 NOTE — Progress Notes (Signed)
Summary: returned call  Phone Note Call from Patient Call back at Home Phone [phone]   Caller: Patient Call For: Rakel Junio Summary of Call: pt returned call from mindy. she didn't know if this was regarding her or spouse michael Piccini DOB 10/16/49 (who is also a pt of [facility]).  Initial call taken by: Tivis Ringer, CNA,  January 14, 2010 4:16 PM  Follow-up for Phone Call        called and spoke with pt and she is aware per MR that the copd test for the MM gene is  normal.  pt stated that her husband has an appt on monday with [facility]. Randell Loop CMA  January 14, 2010 4:21 PM

## 2010-07-05 NOTE — Assessment & Plan Note (Signed)
Summary: rov 9 months///kp   Visit Type:  Follow-up Copy to:  Self Referred Primary Provider/Referring Provider:  Laury Rios  CC:  9 month follow up, pt states she has has to use her advair more bc she has a lot of wheezing, pt states she don't think she can completely come off of it, and pt has no other complaints.  History of Present Illness: Morbidly obese (s/p bandin sugery spring 2010). Gold stage 2 COPD (dx made on smoking hx and pft showing Fev1 1.78L/71%, fef 25-75 45%, dlcpo 53%- mixed obstn/restn on spiro with low dlco). Hx of subjective intolerance to spiriva in March/April 2009. Maintained on advair HFA since summer 2009. Walking desaturation to 91% in office in Aguust 2009   OV 12/27/2009. Cassandra Rios returns for followup of copd. Since lsat visit in October 2010 she has only been using advair intermittetnly/ STates she did not need it. Then in Spring 2011 developed acute sinusitis with wheezing. USed advair at significant levels nos for rescue at that time. Since then improved and is using advair at new baseline level of 12 times per month. STill feels intermittetn wheezing; this was not there last time. In terms of weight loss, she is exercising but feels she has slackened and weight has increase to 234# now from 230# at last visit.    OV 03/22/2009. Followup GOld stage 2 COPD. She is doing well overall. Since banding surgey for morbid obesit in March 2010, she has continued to lose weight. Weight now down to 222# (was 255# at last visit in April). She is walking half mile a day is thinking of joining the [facility] fitness center. She is please with progress on weight loss. Overall more energtic. No dyspnea. Dyspnea is so improved that she forgets to use advair many days. Only issue currently is acute sinusitis. Last few days blowing nose a lot, bloody rinhorrea that is c/w her typical sinusitis, headaches, sinus congestion but no fever. She desires antibiotics. No other issues. Hoarseness of  voice has resolved  Preventive Screening-Counseling & Management  Alcohol-Tobacco     Alcohol drinks/day: 0     Smoking Status: quit     Year Started: 1968     Year Quit: 2008     Pack years: 40 years 2 packs     Passive Smoke Exposure: yes  Caffeine-Diet-Exercise     Caffeine use/day: 1     Diet Comments: s/p lap band surgery     Does Patient Exercise: yes     Type of exercise: walking     Exercise (avg: min/session): <30     Times/week: 5  Current Medications (verified): 1)  Betaseron 0.3 Mg  Solr (Interferon Beta-1b) .Marland Kitchen.. 1 Injection Every Other Night 2)  Crestor 5 Mg  Tabs (Rosuvastatin Calcium) .... Once Every Other Day 3)  Synthroid 112 Mcg Tabs (Levothyroxine Sodium) .Marland Kitchen.. 1 By Mouth Once Daily 4)  Hydrochlorothiazide 25 Mg  Tabs (Hydrochlorothiazide) .... Once Daily 5)  Advair Hfa 45-21 Mcg/act  Aero (Fluticasone-Salmeterol) .... 2 Puffs Two Times A Day 6)  Tylenol Extra Strength 1000 Mg/52ml Liqd (Acetaminophen) .... As Directed 7)  Multivitamins  Tabs (Multiple Vitamin) .... Take 2 Tablet By Mouth Once A Day 8)  Chewable Fiber .... Take 1 Once Daily 9)  Prozac 20 Mg Caps (Fluoxetine Hcl) .Marland Kitchen.. 1 By Mouth Once Daily 10)  Caltrate 600+d Plus 600-400 Mg-Unit Chew (Calcium Carbonate-Vit D-Min) .... 3 Tabs Once Daily 11)  Pt Is Able To Exercise  Allergies: 1)  !  Codeine 2)  ! Astramorph (Morphine Sulfate) 3)  ! * Mycins 4)  ! 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   Risk Factors: Alcohol Use: 0 (12/27/2009) Caffeine Use: 1 (12/27/2009) Diet: s/p lap band surgery (12/27/2009) Exercise: yes (12/27/2009)  Risk Factors: Smoking Status: quit (12/27/2009) Passive Smoke Exposure: yes (12/27/2009)  Past Medical History: Reviewed history from 09/18/2008  and no changes required. #Multiple Sclerosis - dixed 3 years ago. Intermittent. Currently stable. No exacerbations. Taking Beta-Interferon. Dr. Avie Rios #Obesity Morbid (BMI 44) -  - sp banding at [place] 08/2008 #Allergic Rhinitis I#buprofen induced ulcer -  #Colonic polyps - q3 year colnoscopies. Dr. Leary Rios at [facility] #COPD ..............Marland KitchenRamaswamy COPD -  Dx made in 2009 based on smoking hx and pft showing Fev1 1.78L/71%, fef 25-75 45%, dlco 53% - mixed obstn/restn on spiro with low dlco. Refused spiriva. On advair therapy. Prior desaturatioin 08/2007 with exertion. No desatuaration on exercise - 11/26/2007. Desaturated to 91% only on 01/27/2008. Improvement in office spirometry to to an Fev1 of 2.08L/77% in 10/02/2007. 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 hysterectomy gallbladder carpall tunnel bariatric surgery---lap band  09/07/2008 hiatal hernia repair 09/07/2008  Family History: Reviewed history from 06/26/2007 and no changes required. COPD:  sister and dad C V A / Stroke: mom  Social History: Reviewed history from 06/26/2007 and no changes required. 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   Review of Systems       The patient complains of shortness of breath with activity.  The patient denies shortness of breath at rest, productive cough, non-productive cough, coughing up blood, chest pain, irregular heartbeats, acid heartburn, indigestion, loss of appetite, weight change, abdominal pain, difficulty swallowing, sore throat, tooth/dental problems, headaches, nasal congestion/difficulty breathing through nose, sneezing, itching, ear ache,  anxiety, depression, hand/feet swelling, joint stiffness or pain, rash, change in color of mucus, and fever.    Vital Signs:  Patient profile:   60 year old female Height:      64 inches Weight:      234.4 pounds BMI:     40.38 O2 Sat:      97 % on Room air Temp:     98.1 degrees F oral Pulse rate:   63 / minute BP sitting:   112 / 70  (right arm) Cuff size:   large  Vitals Entered By: Carver Fila (December 27, 2009 4:13 PM)  O2 Flow:  Room air CC: 9 month follow up, pt states she has has to use her advair more bc she has a lot of wheezing, pt states she don't think she can completely come off of it,  pt has no other complaints Comments meds and allergies updated Phone number updated Carver Fila  December 27, 2009 4:13 PM    Physical Exam  General:  well developed, well nourished, in no acute distress.  Head:  normocephalic and atraumatic Eyes:  PERRLA/EOM intact; conjunctiva and sclera clear Ears:  TMs intact and clear with normal canals Nose:  little's are on both side has abrasions + Mouth:  no deformity or lesions Neck:  no masses, thyromegaly, or abnormal cervical nodes Chest Wall:  no deformities noted Lungs:  decreased BS bilateral and wheezes bilateral.   no distress full sentences Heart:  regular rate and rhythm, S1, S2 without murmurs, rubs, gallops, or clicks Abdomen:  soft. normal bowel sounds. scars of bariatric surgery + Msk:  no deformity or scoliosis noted with normal posture Pulses:  pulses normal Extremities:  no clubbing, cyanosis, edema, or deformity noted Neurologic:  CN II-XII grossly intact with normal reflexes, coordination, muscle strength and tone Skin:  intact without lesions or rashes Cervical Nodes:  no significant adenopathy Axillary Nodes:  no significant adenopathy Psych:  alert and cooperative; normal mood and affect; normal attention span and concentration   Pulmonary Function Test Date: 12/27/2009 4:40 PM Gender:  Female  Pre-Spirometry FVC    Value: 2.42 L/min   % Pred: 73.20 % FEV1    Value: 1.84 L     Pred: 2.56 L     % Pred: 71.90 % FEV1/FVC  Value: 76.13 %     % Pred: 97.30 %  Comments: unchanged from baseline  Evaluation: moderate obstruction with significant bronchodilator response  Impression & Recommendations:  Problem # 1:  C O P D (ICD-496) Assessment Deteriorated  Appears to be in mild deterioration due to non-compliance with advair. Our original plan was to consider stopping advair if she lost lot of weight so her bMI is <26 or so. However, bMI is still 40. She is wheezing. Cleda Daub is unchanged. Recommended restarting advair hfa on scheduled basis. Will see her again in 1-2 months but she knows to call if she is worse. We gave advair samples and emphasized technique again. ADvised to continue with exercise. Will check A1AT gene profile (send out to [place])  Orders: Est. Patient Level III (14782) Prescription Created Electronically [id number])  Patient Instructions: 1)  please take your advair 2 puff two times a day 2)  this inhaler should be taken daily wihtout fail regardless of how you feel 3)  take samples and discount card from my nurse 4)  show your technique to my nurse 5)  also we will check you for genetic cause of COPD/asthma 6)  return to see me in 1-2 months or sooner if worse Prescriptions: ADVAIR HFA 45-21 MCG/ACT  AERO (FLUTICASONE-SALMETEROL) 2 puffs two times a day  #3 x 6   Entered and Authorized by:   Kalman Shan MD   Signed by:   Kalman Shan MD on 12/27/2009   Method used:   Print then Give to Patient   RxID:   [id number] ADVAIR HFA 45-21 MCG/ACT  AERO (FLUTICASONE-SALMETEROL) 2 puffs two times a day  #3 x 6   Entered and Authorized by:   Kalman Shan MD   Signed by:   Kalman Shan MD on 12/27/2009   Method used:   Electronically to        [facility] [phone]* (retail)       [address]       [place], [place]  [address]        Ph: [phone]       Fax: [phone]   RxID:   [id number]      CardioPerfect Spirometry  ID: [id number] Patient: Cassandra Rios DOB: 1951-03-02 Age: 60 Years Old Sex: Female Race: White Physician: Loreen Freud DO Height: 64 Weight: 234.4 Status: Unconfirmed Past Medical History:  #  Multiple Sclerosis - dixed 3 years ago. Intermittent. Currently stable. No exacerbations. Taking Beta-Interferon. Dr. Avie Rios #Obesity Morbid (BMI 44) -  - sp banding at [place] 08/2008 #Allergic Rhinitis I#buprofen induced ulcer -  #Colonic polyps - q3 year colnoscopies. Dr. Leary Rios at [place] #COPD ..............Marland KitchenRamaswamy COPD -  Dx made in 2009 based on smoking hx and pft showing Fev1 1.78L/71%, fef 25-75 45%, dlco 53% - mixed obstn/restn on spiro with low dlco. Refused spiriva. On advair therapy. Prior desaturatioin 08/2007 with exertion. No desatuaration on exercise - 11/26/2007. Desaturated to 91% only on 01/27/2008. Improvement in office spirometry to to an Fev1 of 2.08L/77% in 10/02/2007. 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 Codeine 2)  ! Astramorph (Morphine Sulfate) 3)  ! * Mycins 4)  ! Ibuprofen 5)  * Dilaudid   Other Orders: T- * Misc. Laboratory test [phone])  Appended Document: rov 9 months///kp pls let her know that genetic cause of copd test , "MM gene" is normal  Appended Document: rov 9 months///kp LMOMTCB X1  Appended Document: rov  9 months///kp pt is aware of results.

## 2010-07-05 NOTE — Consult Note (Signed)
Summary: [facility]  [facility]   Imported By: Lanelle Bal 11/10/2009 11:46:30  _____________________________________________________________________  External Attachment:    Type:   Image     Comment:   External Document

## 2010-07-05 NOTE — Assessment & Plan Note (Signed)
Summary: SHINGLES VAC - LAB CBCD WITH PERIPHERAL SMEAR:288.8/CBS  Nurse Visit   Allergies: 1)  ! Codeine 2)  ! Astramorph (Morphine Sulfate) 3)  ! * Mycins 4)  ! Ibuprofen 5)  * Dilaudid  Immunizations Administered:  Zostavax # 1:    Vaccine Type: Zostavax    Site: right deltoid    Mfr: Merck    Dose: 0.5 ml    Route:     Given by: Almeta Monas CMA (AAMA)    Exp. Date: 01/21/2011    Lot #: [id number]    VIS given: 03/17/05 given February 28, 2010.  Orders Added: 1)  Venipuncture [04540] 2)  TLB-CBC Platelet - w/Differential [85025-CBCD] 3)  Zoster (Shingles) Vaccine Live [90736] 4)  Admin 1st Vaccine [98119]

## 2010-07-05 NOTE — Consult Note (Signed)
Summary: [facility]  [facility]   Imported By: Lanelle Bal 04/07/2010 09:20:08  _____________________________________________________________________  External Attachment:    Type:   Image     Comment:   External Document

## 2010-07-05 NOTE — Progress Notes (Signed)
Summary: reserach study ?-lmtcbx1  Phone Note Outgoing Call   Summary of Call: called patient to see if she would be itnerested in research study. LMTCB. If so, she wouldneed spirometry under Jerolyn Shin Initial call taken by: Kalman Shan MD,  April 12, 2010 1:47 PM  Follow-up for Phone Call        patient feels well right now on advair. declined SUMMITT study. Follow-up by: Kalman Shan MD,  April 15, 2010 6:01 PM

## 2010-07-05 NOTE — Consult Note (Signed)
Summary: [facility]  [facility]   Imported By: Lanelle Bal 03/26/2010 10:02:49  _____________________________________________________________________  External Attachment:    Type:   Image     Comment:   External Document

## 2010-07-07 ENCOUNTER — Encounter: Payer: Self-pay | Admitting: Family Medicine

## 2010-07-07 ENCOUNTER — Encounter (HOSPITAL_BASED_OUTPATIENT_CLINIC_OR_DEPARTMENT_OTHER): Payer: Medicare HMO | Admitting: Hematology & Oncology

## 2010-07-07 DIAGNOSIS — D6949 Other primary thrombocytopenia: Secondary | ICD-10-CM

## 2010-07-07 DIAGNOSIS — D696 Thrombocytopenia, unspecified: Secondary | ICD-10-CM

## 2010-07-07 LAB — MORPHOLOGY - CHCC SATELLITE: PLT EST ~~LOC~~: ADEQUATE

## 2010-07-07 LAB — CBC WITH DIFFERENTIAL (CANCER CENTER ONLY)
BASO#: 0 10*3/uL (ref 0.0–0.2)
Eosinophils Absolute: 0.2 10*3/uL (ref 0.0–0.5)
HCT: 42.1 % (ref 34.8–46.6)
HGB: 14.3 g/dL (ref 11.6–15.9)
LYMPH#: 2.6 10*3/uL (ref 0.9–3.3)
MCHC: 33.9 g/dL (ref 32.0–36.0)
MONO#: 0.5 10*3/uL (ref 0.1–0.9)
NEUT#: 3.4 10*3/uL (ref 1.5–6.5)
NEUT%: 49.6 % (ref 39.6–80.0)
RBC: 4.75 10*6/uL (ref 3.70–5.32)
WBC: 6.8 10*3/uL (ref 3.9–10.0)

## 2010-07-07 NOTE — Consult Note (Signed)
Summary: [facility]  [facility]   Imported By: Lanelle Bal 05/16/2010 12:48:18  _____________________________________________________________________  External Attachment:    Type:   Image     Comment:   External Document

## 2010-07-07 NOTE — Assessment & Plan Note (Signed)
Summary: sinus infection/kn   Vital Signs:  Patient profile:   60 year old female Weight:      240 pounds BMI:     40.08 Temp:     97.6 degrees F oral BP sitting:   118 / 76  (left arm)  Vitals Entered By: Doristine Devoid CMA (June 10, 2010 11:42 AM) CC: sinus HA and congestion   History of Present Illness: 60 yo woman here today for ? sinus infxn.  sxs started 'a couple of weeks ago'.  using Claritin and hot compresses w/ some pain relief around the eyes.  then earlier this week began having bloody nasal drainage.  + facial pain and pressure w/ severe headache.  no ear pain.  + cough due to nasal drainage.  no fever.  + sick contacts.  Current Medications (verified): 1)  Betaseron 0.3 Mg  Solr (Interferon Beta-1b) .Marland Kitchen.. 1 Injection Every Other Night 2)  Crestor 5 Mg  Tabs (Rosuvastatin Calcium) .... Once Every Other Day 3)  Synthroid 112 Mcg Tabs (Levothyroxine Sodium) .Marland Kitchen.. 1 By Mouth Once Daily 4)  Hydrochlorothiazide 25 Mg  Tabs (Hydrochlorothiazide) .... Once Daily 5)  Advair Hfa 45-21 Mcg/act  Aero (Fluticasone-Salmeterol) .... 2 Puffs Two Times A Day 6)  Tylenol Extra Strength 1000 Mg/39ml Liqd (Acetaminophen) .... As Directed 7)  Multivitamins  Tabs (Multiple Vitamin) .... Take 2 Tablet By Mouth Once A Day 8)  Chewable Fiber .... Take 1 Once Daily 9)  Prozac 20 Mg Caps (Fluoxetine Hcl) .Marland Kitchen.. 1 By Mouth Once Daily 10)  Caltrate 600+d Plus 600-400 Mg-Unit Chew (Calcium Carbonate-Vit D-Min) .... 3 Tabs Once Daily 11)  Pt Is Able To Exercise 12)  Ceftin 500 Mg Tabs (Cefuroxime Axetil) .Marland Kitchen.. 1 Tab By Mouth Two Times A Day X10 Days 13)  Tessalon 200 Mg Caps (Benzonatate) .... Take One Capsule By Mouth Three Times A Day As Needed For Cough  Allergies (verified): 1)  ! Codeine 2)  ! Astramorph (Morphine Sulfate) 3)  ! * Mycins 4)  ! Ibuprofen 5)  * Dilaudid  Review of Systems      See HPI  Physical Exam  General:  Well-developed,well-nourished,in no acute distress;  alert,appropriate and cooperative throughout examination.  overweight-appearing.   Head:  Normocephalic and atraumatic without obvious abnormalities. No apparent alopecia or balding.  + TTP over frontal and maxillary sinuses Eyes:  no injxn or inflammation Ears:  External ear exam shows no significant lesions or deformities.  Otoscopic examination reveals clear canals, tympanic membranes are intact bilaterally without bulging, retraction, inflammation or discharge. Hearing is grossly normal bilaterally. Nose:  + congestion Mouth:  Oral mucosa and oropharynx without lesions or exudates.  Teeth in good repair. Neck:  No deformities, masses, or tenderness noted Lungs:  Normal respiratory effort, chest expands symmetrically. Lungs are clear to auscultation, no crackles or wheezes.  + dry cough Heart:  normal rate and no murmur.     Impression & Recommendations:  Problem # 1:  SINUSITIS - ACUTE-NOS (ICD-461.9) Assessment New start abx.  cough meds as needed.  reviewed supportive care and red flags that should prompt return.  Pt expresses understanding and is in agreement w/ this plan. Her updated medication list for this problem includes:    Ceftin 500 Mg Tabs (Cefuroxime axetil) .Marland Kitchen... 1 tab by mouth two times a day x10 days    Tessalon 200 Mg Caps (Benzonatate) .Marland Kitchen... Take one capsule by mouth three times a day as needed for cough  Complete Medication  List: 1)  Betaseron 0.3 Mg Solr (Interferon beta-1b) .Marland Kitchen.. 1 injection every other night 2)  Crestor 5 Mg Tabs (Rosuvastatin calcium) .... Once every other day 3)  Synthroid 112 Mcg Tabs (Levothyroxine sodium) .Marland Kitchen.. 1 by mouth once daily 4)  Hydrochlorothiazide 25 Mg Tabs (Hydrochlorothiazide) .... Once daily 5)  Advair Hfa 45-21 Mcg/act Aero (Fluticasone-salmeterol) .... 2 puffs two times a day 6)  Tylenol Extra Strength 1000 Mg/89ml Liqd (Acetaminophen) .... As directed 7)  Multivitamins Tabs (Multiple vitamin) .... Take 2 tablet by mouth once a  day 8)  Chewable Fiber  .... Take 1 once daily 9)  Prozac 20 Mg Caps (Fluoxetine hcl) .Marland Kitchen.. 1 by mouth once daily 10)  Caltrate 600+d Plus 600-400 Mg-unit Chew (Calcium carbonate-vit d-min) .... 3 tabs once daily 11)  Pt Is Able To Exercise  12)  Ceftin 500 Mg Tabs (Cefuroxime axetil) .Marland Kitchen.. 1 tab by mouth two times a day x10 days 13)  Tessalon 200 Mg Caps (Benzonatate) .... Take one capsule by mouth three times a day as needed for cough  Patient Instructions: 1)  This is a sinus infection 2)  Take the Ceftin as directed for the infection- take w/ food to avoid upset stomach 3)  Use the cough pills as needed 4)  Drink plenty of fluids 5)  Rest! 6)  Call with any questions or concerns 7)  Hang in there!! Prescriptions: TESSALON 200 MG CAPS (BENZONATATE) Take one capsule by mouth three times a day as needed for cough  #60 x 0   Entered and Authorized by:   Neena Rhymes MD   Signed by:   Neena Rhymes MD on 06/10/2010   Method used:   Print then Give to Patient   RxID:   [id number] CEFTIN 500 MG TABS (CEFUROXIME AXETIL) 1 tab by mouth two times a day x10 days  #20 x 0   Entered and Authorized by:   Neena Rhymes MD   Signed by:   Neena Rhymes MD on 06/10/2010   Method used:   Print then Give to Patient   RxID:   [id number]    Orders Added: 1)  Est. Patient Level III [84696]

## 2010-07-07 NOTE — Procedures (Signed)
Summary: Colonoscopy/[facility]  [facility]   Imported By: Lanelle Bal 06/09/2010 08:11:39  _____________________________________________________________________  External Attachment:    Type:   Image     Comment:   External Document  Appended Document: Colonoscopy/[facility]    Clinical Lists Changes  Observations: Added new observation of COLONNXTDUE: 06/07/2013 (06/09/2010 17:31) Added new observation of HZOSTERNEXT: Not Indicated (06/09/2010 17:31) Added new observation of LST COLON DT: 06/07/2010 (06/07/2010 17:31) Added new observation of COLONOSCOPY: polyps and hemrrhoids (06/07/2010 17:31)      Last Colonoscopy:  polyps--- Dr Ewing Schlein ---q3y (01/23/2007 2:46:21 PM) Colonoscopy Result Date:  06/07/2010 Colonoscopy Result:  polyps and hemrrhoids Colonoscopy Next Due:  3 yr

## 2010-07-15 ENCOUNTER — Encounter: Payer: Self-pay | Admitting: Family Medicine

## 2010-08-11 NOTE — Letter (Signed)
Summary: [facility]  [facility]   Imported By: Maryln Gottron 08/04/2010 10:37:11  _____________________________________________________________________  External Attachment:    Type:   Image     Comment:   External Document

## 2010-09-14 LAB — DIFFERENTIAL
Basophils Absolute: 0 10*3/uL (ref 0.0–0.1)
Basophils Relative: 0 % (ref 0–1)
Eosinophils Absolute: 0.1 10*3/uL (ref 0.0–0.7)
Eosinophils Absolute: 0.1 10*3/uL (ref 0.0–0.7)
Eosinophils Relative: 2 % (ref 0–5)
Lymphocytes Relative: 22 % (ref 12–46)
Lymphs Abs: 2.5 10*3/uL (ref 0.7–4.0)
Monocytes Relative: 8 % (ref 3–12)
Neutrophils Relative %: 56 % (ref 43–77)
Neutrophils Relative %: 70 % (ref 43–77)

## 2010-09-14 LAB — CBC
HCT: 42 % (ref 36.0–46.0)
Hemoglobin: 13.9 g/dL (ref 12.0–15.0)
Hemoglobin: 14.4 g/dL (ref 12.0–15.0)
MCHC: 33.2 g/dL (ref 30.0–36.0)
MCHC: 34.2 g/dL (ref 30.0–36.0)
MCHC: 34.3 g/dL (ref 30.0–36.0)
MCV: 89.6 fL (ref 78.0–100.0)
Platelets: 98 10*3/uL — ABNORMAL LOW (ref 150–400)
RBC: 4.24 MIL/uL (ref 3.87–5.11)
RBC: 4.8 MIL/uL (ref 3.87–5.11)
RDW: 13.3 % (ref 11.5–15.5)
RDW: 13.4 % (ref 11.5–15.5)
WBC: 7.4 10*3/uL (ref 4.0–10.5)

## 2010-09-14 LAB — COMPREHENSIVE METABOLIC PANEL
ALT: 35 U/L (ref 0–35)
AST: 31 U/L (ref 0–37)
Alkaline Phosphatase: 65 U/L (ref 39–117)
CO2: 28 mEq/L (ref 19–32)
Calcium: 9.5 mg/dL (ref 8.4–10.5)
GFR calc Af Amer: >60 mL/min (ref >60)
GFR calc non Af Amer: >60 mL/min (ref >60)
Glucose, Bld: 95 mg/dL (ref 70–99)
Potassium: 3.8 mEq/L (ref 3.5–5.1)
Sodium: 134 mEq/L — ABNORMAL LOW (ref 135–145)

## 2010-09-14 LAB — TSH: TSH: 0.145 u[IU]/mL — ABNORMAL LOW (ref 0.350–4.500)

## 2010-09-16 ENCOUNTER — Encounter: Payer: Self-pay | Admitting: Family Medicine

## 2010-09-16 ENCOUNTER — Ambulatory Visit (INDEPENDENT_AMBULATORY_CARE_PROVIDER_SITE_OTHER): Payer: Medicare HMO | Admitting: Family Medicine

## 2010-09-16 VITALS — BP 110/64 | Temp 98.2°F | Ht 66.0 in | Wt 240.1 lb

## 2010-09-16 DIAGNOSIS — J019 Acute sinusitis, unspecified: Secondary | ICD-10-CM

## 2010-09-16 MED ORDER — CEFUROXIME AXETIL 500 MG PO TABS: 500.0000 mg | ORAL_TABLET | Freq: Two times a day (BID) | ORAL | Status: AC

## 2010-09-16 MED ORDER — MOMETASONE FUROATE 50 MCG/ACT NA SUSP: 2.0000 | Freq: Every day | NASAL | Status: DC

## 2010-09-16 NOTE — Patient Instructions (Signed)

## 2010-09-16 NOTE — Progress Notes (Signed)
  Subjective:     Cassandra Rios is a 60 y.o. female who presents for evaluation of sinus pain. Symptoms include: congestion, cough, facial pain, headaches, nasal congestion, sinus pressure, sore throat and blood from nose. Onset of symptoms was 1 week ago. Symptoms have been gradually worsening since that time. Past history is significant for occasional episodes of bronchitis and COPD. Patient is a former smoker, quit 3.5  years ago.  The following portions of the patient's history were reviewed and updated as appropriate: allergies, current medications, past family history, past medical history, past social history, past surgical history and problem list.  Review of Systems Pertinent items are noted in HPI.   Objective:    BP 110/64  Temp(Src) 98.2 F (36.8 C) (Oral)  Ht 5\' 6"  (1.676 m)  Wt 240 lb 2 oz (108.92 kg)  BMI 38.76 kg/m2 General appearance: alert, cooperative, appears stated age and no distress Head: Normocephalic, without obvious abnormality, atraumatic Eyes: conjunctivae/corneas clear. PERRL, EOM's intact. Fundi benign. Ears: normal TM's and external ear canals both ears Nose: Nares normal. Septum midline. Mucosa normal. No drainage or sinus tenderness., mild congestion, sinus tenderness bilateral Throat: lips, mucosa, and tongue normal; teeth and gums normal Neck: mild anterior cervical adenopathy and thyroid not enlarged, symmetric, no tenderness/mass/nodules Lungs: clear to auscultation bilaterally Heart: regular rate and rhythm, S1, S2 normal, no murmur, click, rub or gallop Extremities: extremities normal, atraumatic, no cyanosis or edema Lymph nodes: Cervical adenopathy: enlarged Neurologic: Grossly normal    Assessment:    Acute bacterial sinusitis.    Plan:    Nasal saline sprays. Neti pot recommended. Instructions given. Ceftin per medication orders. Follow up in 2 weeks if no better then or as needed.

## 2010-10-18 NOTE — Procedures (Signed)
NAME:  Cassandra Rios, Cassandra Rios                ACCOUNT NO.:  [id number]   MEDICAL RECORD NO.:  [id number]          PATIENT TYPE:  OUT   LOCATION:  SLEEP CENTER                 FACILITY:  [facility]   PHYSICIAN:  Clinton D. Maple Hudson, MD, FCCP, FACPDATE OF BIRTH:  1950[birth year]   DATE OF STUDY:  03/07/2008                            NOCTURNAL POLYSOMNOGRAM   REFERRING PHYSICIAN:  Thornton Park. Daphine Deutscher, MD   SLEEP STUDY REPORT   INDICATION FOR STUDY:  Hypersomnia with sleep apnea.  Epworth sleepiness  score 5/24, BMI 44.9, weight 278 pounds, height 66 inches, and neck 17  inches.   HOME MEDICATIONS:  Charted and reviewed.   SLEEP ARCHITECTURE:  Total sleep time 353 minutes with sleep efficiency  83.3%.  Stage I was 4.1%, stage II 72.1%, stage III absent, REM 23.8% of  total sleep time.  Sleep latency 12.5 minutes.  REM latency 41.5  minutes, awake after sleep onset 58%, arousal index 5.8.  No bedtime  medication taken.   RESPIRATORY DATA:  Apnea-hypopnea index (AHI) 6.1 per hour.  A total of  36 events were scored including, all hypopneas.  Events were not  positional.  REM AHI 21.4 per hour.   OXYGEN DATA:  Moderately loud snoring with oxygen desaturation to a  nadir of 83%.  Mean oxygen saturation through the study 94.2% on room  air.  A total of 3.8 minutes was spent with saturation less than 88%.   CARDIAC DATA:  Normal sinus rhythm.   MOVEMENT/PARASOMNIA:  No significant movement disturbance.  Bactrim x2.   IMPRESSION/RECOMMENDATION:  1. Mild obstructive sleep apnea/hypopnea syndrome, apnea-hypopnea      index 6.1 per hour with nonpositional events.  Moderately loud      snoring and oxygen desaturation to a nadir of 83%.  2. Weight loss is likely to be therapeutic.  Consider return for CPAP      titration or evaluate for alternative therapies as appropriate.     Clinton D. Maple Hudson, MD, [facility], FACP  Diplomate, Biomedical engineer of Sleep Medicine  Electronically Signed    CDY/MEDQ  D:  03/14/2008  13:55:21  T:  03/15/2008 02:46:27  Job:  [id number]

## 2010-10-18 NOTE — Op Note (Signed)
Cassandra Rios, Cassandra Rios                ACCOUNT NO.:  [id number]   MEDICAL RECORD NO.:  [id number]          PATIENT TYPE:  AMB   LOCATION:  DAY                          FACILITY:  [facility]   PHYSICIAN:  Thornton Park. Daphine Deutscher, MD  DATE OF BIRTH:  10/23/1950   DATE OF PROCEDURE:  09/07/2008  DATE OF DISCHARGE:                               OPERATIVE REPORT   PREOPERATIVE DIAGNOSIS:  1. Morbid obesity with body mass index of 44.  2. Hiatal hernia.  3. Gastroesophageal reflux disease.   POSTOPERATIVE DIAGNOSIS:  1. Morbid obesity with body mass index of 44.  2. Hiatal hernia.  3. Gastroesophageal reflux disease.   PROCEDURES:  Laparoscopic posterior repair of paraesophageal hiatal  hernia, Lap band and APS placement.   SURGEON:  Dr. Luretha Murphy.   ASSISTANT:  Ovidio Kin, MD.   ANESTHESIA:  General endotracheal.   DESCRIPTION OF PROCEDURE:  [name] was taken to room 1 on Monday,  09/07/2008 and given general anesthesia.  The abdomen was prepped with  Techni-Care equivalent draped sterilely.  The abdomen was accessed  through the left upper quadrant using a 0-degree, 10-mm Optiview  technique without difficulty.  Once insufflated I then had to place  standard ports but then had to put in an extra 6 later in the case to  try to get up into the hiatal hernia repair.  She was very high and it  required extra an extra port.  Once the [facility] retractor was  installed we began first dissecting the hiatus.  I had pulled up her  upper GI on the screen in the room and we found her to have a hiatal  hernia and she had a little dimple anterior.  I went ahead and dissected  posteriorly and found the left to right crus and the vagus nerve.  The  vagus nerve and esophagus were held anteriorly and I got purchase of the  left crus and the right crus and secured that with a tie knot.  This  obliterated the dimple anteriorly.  The calibration tubing was then  passed and with 10 cm in the balloon  it would not move up above the GE  junction.   Just below the hiatal hernia repair I went through a separate spot below  where the fat stripe was on the right crus and passed the band passer,  bringing it up and then feeling this was right for an APS band.  This  was inserted with a little silk suture on the tail put into the band  passer, brought around and engaged.  The sizing tubing was once again  passed and the band secured over the sizing tubing.  When it was held in  place it was then plicated anteriorly with 3 sutures of Surgidac and  with tie knots.  This was then brought out in place into a subcu port on  the right side with  Marlex mesh on the back of the of the band port.  The area was irrigated  and all wounds were injected with Marcaine and closed with 4-0 Vicryl,  Benzoin and Steri-Strips.  The patient tolerated the procedure well and  was taken to the recovery room in satisfactory condition.      Thornton Park Daphine Deutscher, MD  Electronically Signed     MBM/MEDQ  D:  09/07/2008  T:  09/07/2008  Job:  [id number]   cc:   Farrel Gobble, MD

## 2010-10-21 NOTE — Op Note (Signed)
NAMEALEYSSA, PIKE             ACCOUNT NO.:  [id number]   MEDICAL RECORD NO.:  [id number]          PATIENT TYPE:  OUT   LOCATION:  MDC                          FACILITY:  [facility]   PHYSICIAN:  Genene Churn. Love, M.D.    DATE OF BIRTH:  [birth year]   DATE OF PROCEDURE:  04/27/2004  DATE OF DISCHARGE:                                 OPERATIVE REPORT   PROCEDURE PERFORMED:  Lumbar puncture.   DESCRIPTION OF PROCEDURE:  The patient was prepped and draped in the usual  manner using Betadine and 1% Xylocaine.  The L4-L5 interspace was entered  without difficulty.  The opening pressure was 240 mmH2O.  This patient was  tense.  Clear, colorless CSF was obtained and sent for RPR, protein,  glucose, cell count and diff, IgG and oligoclonal IgG, anti-DNA, and  angiotensin converting enzyme.  One tube was to be held.  The patient  tolerated the procedure well.       JML/MEDQ  D:  04/27/2004  T:  04/27/2004  Job:  [id number]

## 2011-01-18 ENCOUNTER — Ambulatory Visit (INDEPENDENT_AMBULATORY_CARE_PROVIDER_SITE_OTHER): Payer: Medicare HMO | Admitting: Family Medicine

## 2011-01-18 ENCOUNTER — Encounter: Payer: Self-pay | Admitting: Family Medicine

## 2011-01-18 VITALS — BP 120/74 | Temp 98.8°F | Wt 240.8 lb

## 2011-01-18 DIAGNOSIS — J019 Acute sinusitis, unspecified: Secondary | ICD-10-CM

## 2011-01-18 MED ORDER — CEFUROXIME AXETIL 500 MG PO TABS: 500.0000 mg | ORAL_TABLET | Freq: Two times a day (BID) | ORAL | Status: AC

## 2011-01-18 NOTE — Patient Instructions (Signed)
This appears to be your 'yearly' sinus infection Take the Ceftin as directed- take w/ food to avoid upset stomach Continue heating pad for pain relief REST! Hang in there!

## 2011-01-18 NOTE — Assessment & Plan Note (Signed)
Pt's sxs and PE consistent w/ infxn.  Start Ceftin as pt has tolerated this well previously.  Reviewed supportive care and red flags that should prompt return.  Pt expressed understanding and is in agreement w/ plan.

## 2011-01-18 NOTE — Progress Notes (Signed)
Subjective:    Patient ID: Cassandra Rios, female    DOB: 11-15-1950, 60 y.o.   MRN: [id number]  HPI Sinus infxn- having facial pain, mostly behind her eyes, nasal congestion and drainage.  No fevers.  sxs started this weekend.  Mild ear fullness.  No cough.  No known sick contacts.  Hx of recurrent sinus infxns- typically takes Ceftin 500mg  bid x10 days.   Review of Systems For ROS see HPI     Objective:   Physical Exam  Constitutional: She appears well-developed and well-nourished. No distress.  HENT:  Head: Normocephalic and atraumatic.  Right Ear: Tympanic membrane normal.  Left Ear: Tympanic membrane normal.  Nose: Mucosal edema and rhinorrhea present. Right sinus exhibits frontal sinus tenderness. Right sinus exhibits no maxillary sinus tenderness. Left sinus exhibits frontal sinus tenderness. Left sinus exhibits no maxillary sinus tenderness.  Mouth/Throat: Uvula is midline and mucous membranes are normal. Posterior oropharyngeal erythema present. No oropharyngeal exudate.  Eyes: Conjunctivae and EOM are normal. Pupils are equal, round, and reactive to light.  Neck: Normal range of motion. Neck supple.  Cardiovascular: Normal rate, regular rhythm and normal heart sounds.   Pulmonary/Chest: Effort normal and breath sounds normal. No respiratory distress. She has no wheezes.  Lymphadenopathy:    She has no cervical adenopathy.          Assessment & Plan:

## 2011-03-02 ENCOUNTER — Telehealth (INDEPENDENT_AMBULATORY_CARE_PROVIDER_SITE_OTHER): Payer: Self-pay | Admitting: Surgery

## 2011-03-02 NOTE — Telephone Encounter (Signed)
03/02/11 Recall letter mailed to patient for bariatric surgery follow-up.  Adv pt to call to schedule appt...cef °

## 2011-03-07 ENCOUNTER — Encounter: Payer: Self-pay | Admitting: Family Medicine

## 2011-03-07 ENCOUNTER — Ambulatory Visit (INDEPENDENT_AMBULATORY_CARE_PROVIDER_SITE_OTHER): Payer: Medicare HMO | Admitting: Family Medicine

## 2011-03-07 VITALS — BP 136/72 | HR 65 | Temp 98.6°F | Ht 65.75 in | Wt 241.6 lb

## 2011-03-07 DIAGNOSIS — R079 Chest pain, unspecified: Secondary | ICD-10-CM

## 2011-03-07 DIAGNOSIS — R7309 Other abnormal glucose: Secondary | ICD-10-CM

## 2011-03-07 DIAGNOSIS — Z23 Encounter for immunization: Secondary | ICD-10-CM

## 2011-03-07 DIAGNOSIS — E039 Hypothyroidism, unspecified: Secondary | ICD-10-CM

## 2011-03-07 DIAGNOSIS — Z0000 Encounter for general adult medical examination without abnormal findings: Secondary | ICD-10-CM

## 2011-03-07 DIAGNOSIS — E785 Hyperlipidemia, unspecified: Secondary | ICD-10-CM

## 2011-03-07 DIAGNOSIS — F411 Generalized anxiety disorder: Secondary | ICD-10-CM

## 2011-03-07 DIAGNOSIS — F419 Anxiety disorder, unspecified: Secondary | ICD-10-CM

## 2011-03-07 DIAGNOSIS — R739 Hyperglycemia, unspecified: Secondary | ICD-10-CM

## 2011-03-07 MED ORDER — FLUOXETINE HCL 40 MG PO CAPS: 40.0000 mg | ORAL_CAPSULE | Freq: Every day | ORAL | Status: DC

## 2011-03-07 MED ORDER — PNEUMOCOCCAL VAC POLYVALENT 25 MCG/0.5ML IJ INJ: 0.5000 mL | INJECTION | Freq: Once | INTRAMUSCULAR | Status: DC

## 2011-03-07 NOTE — Patient Instructions (Signed)

## 2011-03-07 NOTE — Progress Notes (Signed)
Subjective:     Cassandra Rios is a 60 y.o. female and is here for a comprehensive physical exam. The patient reports problems - chest pain with increased stress with her husbands illness.  Last episode chest pain 2 days ago.  . Pt states it feels like  A spasm in her chest.  It only lasts a few seconds ---no sob or diaphoresis.    History   Social History  . Marital Status: Married    Spouse Name: N/A    Number of Children: N/A  . Years of Education: N/A   Occupational History  . medical claims management  [employer]   Social History Main Topics  . Smoking status: Former Smoker -- 2.0 packs/day for 35 years    Types: Cigarettes    Quit date: 04/05/2007  . Smokeless tobacco: Not on file  . Alcohol Use: No     very rare  . Drug Use: No  . Sexually Active: Yes -- Female partner(s)   Other Topics Concern  . Not on file   Social History Narrative  . No narrative on file   Health Maintenance  Topic Date Due  . Tetanus/tdap  02/02/1969  . Influenza Vaccine  03/06/2011  . Mammogram  07/16/2011  . Pap Smear  03/06/2014  . Colonoscopy  03/07/2015  . Zostavax  Completed    The following portions of the patient's history were reviewed and updated as appropriate: allergies, current medications, past family history, past medical history, past social history, past surgical history and problem list.  Review of Systems Review of Systems  Constitutional: Negative for activity change, appetite change and fatigue.  HENT: Negative for hearing loss, congestion, tinnitus and ear discharge.  dentist q8m Eyes: Negative for visual disturbance (see optho q1y -- vision corrected to 20/20 with glasses).  Respiratory: Negative for cough, chest tightness and shortness of breath.   Cardiovascular: Negative for chest pain, palpitations and leg swelling.  Gastrointestinal: Negative for abdominal pain, diarrhea, constipation and abdominal distention.  Genitourinary: Negative for urgency, frequency,  decreased urine volume and difficulty urinating.  Musculoskeletal: Negative for back pain, arthralgias and gait problem.  Skin: Negative for color change, pallor and rash.  Neurological: Negative for dizziness, light-headedness, numbness and headaches.  Hematological: Negative for adenopathy. Does not bruise/bleed easily.  Psychiatric/Behavioral: Negative for suicidal ideas, confusion, sleep disturbance, self-injury, dysphoric mood, decreased concentration and agitation.       Objective:    BP 136/72  Pulse 65  Temp(Src) 98.6 F (37 C) (Oral)  Ht 5' 5.75" (1.67 m)  Wt 241 lb 9.6 oz (109.589 kg)  BMI 39.29 kg/m2  SpO2 97% General appearance: alert, cooperative, appears stated age, no distress and morbidly obese Head: Normocephalic, without obvious abnormality, atraumatic Eyes: conjunctivae/corneas clear. PERRL, EOM's intact. Fundi benign. Ears: normal TM's and external ear canals both ears Nose: Nares normal. Septum midline. Mucosa normal. No drainage or sinus tenderness. Throat: lips, mucosa, and tongue normal; teeth and gums normal Neck: no adenopathy, no carotid bruit, no JVD, supple, symmetrical, trachea midline and thyroid not enlarged, symmetric, no tenderness/mass/nodules Back: symmetric, no curvature. ROM normal. No CVA tenderness. Lungs: clear to auscultation bilaterally Breasts: normal appearance, no masses or tenderness Heart: regular rate and rhythm, S1, S2 normal, no murmur, click, rub or gallop Abdomen: soft, non-tender; bowel sounds normal; no masses,  no organomegaly Pelvic: not indicated; status post hysterectomy, negative ROS Extremities: extremities normal, atraumatic, no cyanosis or edema Pulses: 2+ and symmetric Skin: Skin color, texture,  turgor normal. No rashes or lesions Lymph nodes: Cervical, supraclavicular, and axillary nodes normal. Neurologic: Alert and oriented X 3, normal strength and tone. Normal symmetric reflexes. Normal coordination and  gait psych-- not depressed ,  not anxious just a little stressed    Assessment:    Healthy female exam.  hyperlipidemia-- check labs,  con't crestor HTN--stable , con't meds Hyperglycemia--- watch diet , check labs   Plan:  ghm utd Check fasting labs Check mammogram immun reviewed   See After Visit Summary for Counseling Recommendations

## 2011-03-10 ENCOUNTER — Other Ambulatory Visit: Payer: Self-pay | Admitting: Family Medicine

## 2011-03-10 DIAGNOSIS — Z0000 Encounter for general adult medical examination without abnormal findings: Secondary | ICD-10-CM

## 2011-03-13 ENCOUNTER — Other Ambulatory Visit (INDEPENDENT_AMBULATORY_CARE_PROVIDER_SITE_OTHER): Payer: Medicare HMO

## 2011-03-13 DIAGNOSIS — Z0000 Encounter for general adult medical examination without abnormal findings: Secondary | ICD-10-CM

## 2011-03-13 DIAGNOSIS — R739 Hyperglycemia, unspecified: Secondary | ICD-10-CM

## 2011-03-13 DIAGNOSIS — E785 Hyperlipidemia, unspecified: Secondary | ICD-10-CM

## 2011-03-13 DIAGNOSIS — R7309 Other abnormal glucose: Secondary | ICD-10-CM

## 2011-03-13 DIAGNOSIS — E039 Hypothyroidism, unspecified: Secondary | ICD-10-CM

## 2011-03-13 LAB — CBC WITH DIFFERENTIAL/PLATELET
Basophils Relative: 0.5 % (ref 0.0–3.0)
Eosinophils Absolute: 0.1 10*3/uL (ref 0.0–0.7)
Eosinophils Relative: 2.2 % (ref 0.0–5.0)
HCT: 44.1 % (ref 36.0–46.0)
Lymphs Abs: 1.5 10*3/uL (ref 0.7–4.0)
MCHC: 33.7 g/dL (ref 30.0–36.0)
MCV: 89.9 fl (ref 78.0–100.0)
Monocytes Absolute: 0.6 10*3/uL (ref 0.1–1.0)
Neutrophils Relative %: 65.9 % (ref 43.0–77.0)
Platelets: 98 10*3/uL — ABNORMAL LOW (ref 150.0–400.0)
RBC: 4.9 Mil/uL (ref 3.87–5.11)

## 2011-03-13 LAB — LIPID PANEL
LDL Cholesterol: 109 mg/dL — ABNORMAL HIGH (ref 0–99)
Total CHOL/HDL Ratio: 4
VLDL: 24.8 mg/dL (ref 0.0–40.0)

## 2011-03-13 LAB — HEPATIC FUNCTION PANEL
Alkaline Phosphatase: 56 U/L (ref 39–117)
Bilirubin, Direct: 0.1 mg/dL (ref 0.0–0.3)

## 2011-03-13 LAB — BASIC METABOLIC PANEL
BUN: 11 mg/dL (ref 6–23)
CO2: 28 mEq/L (ref 19–32)
Chloride: 101 mEq/L (ref 96–112)
Creatinine, Ser: 0.8 mg/dL (ref 0.4–1.2)
Potassium: 4 mEq/L (ref 3.5–5.1)

## 2011-03-13 NOTE — Progress Notes (Signed)
Labs only

## 2011-03-20 ENCOUNTER — Telehealth: Payer: Self-pay | Admitting: *Deleted

## 2011-03-20 NOTE — Telephone Encounter (Signed)
Left detailed mess informing pt that her Wellness form and labs are completed/ready for p/u.

## 2011-03-21 ENCOUNTER — Telehealth: Payer: Self-pay | Admitting: *Deleted

## 2011-03-21 MED ORDER — ROSUVASTATIN CALCIUM 10 MG PO TABS: 10.0000 mg | ORAL_TABLET | Freq: Every day | ORAL | Status: DC

## 2011-03-21 NOTE — Telephone Encounter (Signed)
Message copied by Verdene Rio on Tue Mar 21, 2011  8:38 AM ------      Message from: Lelon Perla      Created: Mon Mar 13, 2011  8:21 PM       Cholesterol--- LDL goal < 100,  HDL >40,  TG < 150.  Diet and exercise will increase HDL and decrease LDL and TG.  Fish,  Fish Oil, Flaxseed oil will also help increase the HDL and decrease Triglycerides.   Recheck labs in 3 months.-----is pt able to increase crestor to 10 mg qhs?   If so ---- #30  2 refills-----272.4  Lipid, hep, bmp---thyroid normal----   con't current dose

## 2011-05-31 ENCOUNTER — Other Ambulatory Visit: Payer: Self-pay | Admitting: Family Medicine

## 2011-05-31 DIAGNOSIS — E785 Hyperlipidemia, unspecified: Secondary | ICD-10-CM

## 2011-06-01 ENCOUNTER — Other Ambulatory Visit (INDEPENDENT_AMBULATORY_CARE_PROVIDER_SITE_OTHER): Payer: Medicare HMO

## 2011-06-01 DIAGNOSIS — E785 Hyperlipidemia, unspecified: Secondary | ICD-10-CM

## 2011-06-01 LAB — LIPID PANEL
Cholesterol: 148 mg/dL (ref 0–200)
LDL Cholesterol: 76 mg/dL (ref 0–99)
VLDL: 18.2 mg/dL (ref 0.0–40.0)

## 2011-06-01 LAB — BASIC METABOLIC PANEL
BUN: 11 mg/dL (ref 6–23)
CO2: 30 mEq/L (ref 19–32)
Chloride: 100 mEq/L (ref 96–112)
Creatinine, Ser: 0.9 mg/dL (ref 0.4–1.2)
Glucose, Bld: 112 mg/dL — ABNORMAL HIGH (ref 70–99)
Potassium: 4.7 mEq/L (ref 3.5–5.1)

## 2011-06-01 LAB — HEPATIC FUNCTION PANEL
Alkaline Phosphatase: 57 U/L (ref 39–117)
Bilirubin, Direct: 0.1 mg/dL (ref 0.0–0.3)
Total Bilirubin: 0.4 mg/dL (ref 0.3–1.2)
Total Protein: 7.3 g/dL (ref 6.0–8.3)

## 2011-06-01 NOTE — Progress Notes (Signed)
LABS ONLY  

## 2011-06-21 ENCOUNTER — Other Ambulatory Visit: Payer: Self-pay | Admitting: Family Medicine

## 2011-07-17 ENCOUNTER — Encounter: Payer: Self-pay | Admitting: Family Medicine

## 2011-07-17 ENCOUNTER — Ambulatory Visit (INDEPENDENT_AMBULATORY_CARE_PROVIDER_SITE_OTHER): Payer: Medicare HMO | Admitting: Family Medicine

## 2011-07-17 VITALS — BP 142/74 | HR 62 | Temp 97.9°F | Wt 241.0 lb

## 2011-07-17 DIAGNOSIS — I10 Essential (primary) hypertension: Secondary | ICD-10-CM

## 2011-07-17 DIAGNOSIS — E785 Hyperlipidemia, unspecified: Secondary | ICD-10-CM

## 2011-07-17 DIAGNOSIS — J329 Chronic sinusitis, unspecified: Secondary | ICD-10-CM

## 2011-07-17 MED ORDER — LISINOPRIL-HYDROCHLOROTHIAZIDE 10-12.5 MG PO TABS: 1.0000 | ORAL_TABLET | Freq: Every day | ORAL | Status: DC

## 2011-07-17 MED ORDER — ATORVASTATIN CALCIUM 20 MG PO TABS: 20.0000 mg | ORAL_TABLET | Freq: Every day | ORAL | Status: DC

## 2011-07-17 MED ORDER — FLUTICASONE PROPIONATE 50 MCG/ACT NA SUSP: 2.0000 | Freq: Every day | NASAL | Status: DC

## 2011-07-17 MED ORDER — AMOXICILLIN-POT CLAVULANATE 875-125 MG PO TABS: 1.0000 | ORAL_TABLET | Freq: Two times a day (BID) | ORAL | Status: AC

## 2011-07-17 NOTE — Progress Notes (Signed)
  Subjective:     Cassandra Rios is a 61 y.o. female who presents for evaluation of sinus pain. Symptoms include: congestion, cough, facial pain, nasal congestion and sinus pressure. Onset of symptoms was 4 days ago. Symptoms have been gradually worsening since that time. Past history is significant for no history of pneumonia or bronchitis. Patient is a former smoker, quit 4.5 years ago.  The following portions of the patient's history were reviewed and updated as appropriate: allergies, current medications, past family history, past medical history, past social history, past surgical history and problem list.  Review of Systems Pertinent items are noted in HPI.   Objective:    BP 142/74  Pulse 62  Temp(Src) 97.9 F (36.6 C) (Oral)  Wt 241 lb (109.317 kg)  SpO2 98% General appearance: alert, cooperative, appears stated age and no distress Ears: normal TM's and external ear canals both ears Nose: green discharge, moderate congestion, turbinates red, swollen, edematous, sinus tenderness bilateral Throat: lips, mucosa, and tongue normal; teeth and gums normal Neck: mild anterior cervical adenopathy, supple, symmetrical, trachea midline and thyroid not enlarged, symmetric, no tenderness/mass/nodules Lungs: clear to auscultation bilaterally Heart: S1, S2 normal Extremities: extremities normal, atraumatic, no cyanosis or edema    Assessment:    Acute bacterial sinusitis.  HTN- lisinopril hct Hyperlipidemia-lipitor and take co q 10 20 mg daily Plan:    Nasal steroids per medication orders. Antihistamines per medication orders. Ceftin per medication orders. f/u 2-3 weeks for bp check

## 2011-07-17 NOTE — Patient Instructions (Signed)
Hypertension As your heart beats, it forces blood through your arteries. This force is your blood pressure. If the pressure is too high, it is called hypertension (HTN) or high blood pressure. HTN is dangerous because you may have it and not know it. High blood pressure may mean that your heart has to work harder to pump blood. Your arteries may be narrow or stiff. The extra work puts you at risk for heart disease, stroke, and other problems.  Blood pressure consists of two numbers, a higher number over a lower, 110/72, for example. It is stated as "110 over 72." The ideal is below 120 for the top number (systolic) and under 80 for the bottom (diastolic). Write down your blood pressure today. You should pay close attention to your blood pressure if you have certain conditions such as:  Heart failure.   Prior heart attack.   Diabetes   Chronic kidney disease.   Prior stroke.   Multiple risk factors for heart disease.  To see if you have HTN, your blood pressure should be measured while you are seated with your arm held at the level of the heart. It should be measured at least twice. A one-time elevated blood pressure reading (especially in the Emergency Department) does not mean that you need treatment. There may be conditions in which the blood pressure is different between your right and left arms. It is important to see your caregiver soon for a recheck. Most people have essential hypertension which means that there is not a specific cause. This type of high blood pressure may be lowered by changing lifestyle factors such as:  Stress.   Smoking.   Lack of exercise.   Excessive weight.   Drug/tobacco/alcohol use.   Eating less salt.  Most people do not have symptoms from high blood pressure until it has caused damage to the body. Effective treatment can often prevent, delay or reduce that damage. TREATMENT  When a cause has been identified, treatment for high blood pressure is  directed at the cause. There are a large number of medications to treat HTN. These fall into several categories, and your caregiver will help you select the medicines that are best for you. Medications may have side effects. You should review side effects with your caregiver. If your blood pressure stays high after you have made lifestyle changes or started on medicines,   Your medication(s) may need to be changed.   Other problems may need to be addressed.   Be certain you understand your prescriptions, and know how and when to take your medicine.   Be sure to follow up with your caregiver within the time frame advised (usually within two weeks) to have your blood pressure rechecked and to review your medications.   If you are taking more than one medicine to lower your blood pressure, make sure you know how and at what times they should be taken. Taking two medicines at the same time can result in blood pressure that is too low.  SEEK IMMEDIATE MEDICAL CARE IF:  You develop a severe headache, blurred or changing vision, or confusion.   You have unusual weakness or numbness, or a faint feeling.   You have severe chest or abdominal pain, vomiting, or breathing problems.  MAKE SURE YOU:   Understand these instructions.   Will watch your condition.   Will get help right away if you are not doing well or get worse.  Document Released: 05/22/2005 Document Revised: 02/01/2011 Document Reviewed:   01/10/2008 ExitCare Patient Information 2012 [employer].Sinusitis Sinuses are air pockets within the bones of your face. The growth of bacteria within a sinus leads to infection. The infection prevents the sinuses from draining. This infection is called sinusitis. SYMPTOMS  There will be different areas of pain depending on which sinuses have become infected.  The maxillary sinuses often produce pain beneath the eyes.   Frontal sinusitis may cause pain in the middle of the forehead and above  the eyes.  Other problems (symptoms) include:  Toothaches.   Colored, pus-like (purulent) drainage from the nose.   Swelling, warmth, and tenderness over the sinus areas may be signs of infection.  TREATMENT  Sinusitis is most often determined by an exam.X-rays may be taken. If x-rays have been taken, make sure you obtain your results or find out how you are to obtain them. Your caregiver may give you medications (antibiotics). These are medications that will help kill the bacteria causing the infection. You may also be given a medication (decongestant) that helps to reduce sinus swelling.  HOME CARE INSTRUCTIONS   Only take over-the-counter or prescription medicines for pain, discomfort, or fever as directed by your caregiver.   Drink extra fluids. Fluids help thin the mucus so your sinuses can drain more easily.   Applying either moist heat or ice packs to the sinus areas may help relieve discomfort.   Use saline nasal sprays to help moisten your sinuses. The sprays can be found at your local drugstore.  SEEK IMMEDIATE MEDICAL CARE IF:  You have a fever.   You have increasing pain, severe headaches, or toothache.   You have nausea, vomiting, or drowsiness.   You develop unusual swelling around the face or trouble seeing.  MAKE SURE YOU:   Understand these instructions.   Will watch your condition.   Will get help right away if you are not doing well or get worse.  Document Released: 05/22/2005 Document Revised: 02/01/2011 Document Reviewed: 12/19/2006 Exit[employer] Patient Information 2012 [employer]. 

## 2011-08-07 ENCOUNTER — Encounter: Payer: Self-pay | Admitting: Family Medicine

## 2011-08-07 ENCOUNTER — Ambulatory Visit (INDEPENDENT_AMBULATORY_CARE_PROVIDER_SITE_OTHER): Payer: Medicare HMO | Admitting: Family Medicine

## 2011-08-07 VITALS — BP 134/70 | HR 60 | Temp 98.4°F | Wt 249.4 lb

## 2011-08-07 DIAGNOSIS — I10 Essential (primary) hypertension: Secondary | ICD-10-CM

## 2011-08-07 MED ORDER — LOSARTAN POTASSIUM-HCTZ 50-12.5 MG PO TABS: 1.0000 | ORAL_TABLET | Freq: Every day | ORAL | Status: DC

## 2011-08-07 NOTE — Patient Instructions (Signed)

## 2011-08-07 NOTE — Progress Notes (Signed)
  Subjective:    Patient here for follow-up of elevated blood pressure.  She is not exercising and is adherent to a low-salt diet.  Blood pressure is not well controlled at home. Cardiac symptoms: none. Patient denies: chest pain, chest pressure/discomfort, claudication, dyspnea, exertional chest pressure/discomfort, fatigue, irregular heart beat, lower extremity edema, near-syncope, orthopnea, palpitations, paroxysmal nocturnal dyspnea, syncope and tachypnea. Cardiovascular risk factors: dyslipidemia, hypertension, obesity (BMI >= 30 kg/m2) and sedentary lifestyle. Use of agents associated with hypertension: none. History of target organ damage: none.  The following portions of the patient's history were reviewed and updated as appropriate: allergies, current medications, past family history, past medical history, past social history, past surgical history and problem list.  Review of Systems Pertinent items are noted in HPI.     Objective:    BP 134/70  Pulse 60  Temp(Src) 98.4 F (36.9 C) (Oral)  Wt 249 lb 6.4 oz (113.127 kg)  SpO2 97% General appearance: alert, cooperative, appears stated age and no distress Neck: no adenopathy, supple, symmetrical, trachea midline and thyroid not enlarged, symmetric, no tenderness/mass/nodules Lungs: clear to auscultation bilaterally Heart: regular rate and rhythm, S1, S2 normal, no murmur, click, rub or gallop Extremities: extremities normal, atraumatic, no cyanosis or edema    Assessment:    Hypertension, stage 1 . Evidence of target organ damage: none.    Plan:    Medication: discontinue lisinopril and begin hyzaar. Dietary sodium restriction. Regular aerobic exercise. Follow up: 3 weeks and as needed.

## 2011-08-07 NOTE — Progress Notes (Signed)
Addended by: Lelon Perla on: 08/07/2011 11:55 AM   Modules accepted: Level of Service

## 2011-08-09 ENCOUNTER — Encounter: Payer: Self-pay | Admitting: Family Medicine

## 2011-08-28 ENCOUNTER — Ambulatory Visit (INDEPENDENT_AMBULATORY_CARE_PROVIDER_SITE_OTHER): Payer: Managed Care, Other (non HMO) | Admitting: Family Medicine

## 2011-08-28 ENCOUNTER — Encounter: Payer: Self-pay | Admitting: Family Medicine

## 2011-08-28 VITALS — BP 128/72 | HR 63 | Temp 98.1°F | Wt 251.0 lb

## 2011-08-28 DIAGNOSIS — I10 Essential (primary) hypertension: Secondary | ICD-10-CM

## 2011-08-28 DIAGNOSIS — R7309 Other abnormal glucose: Secondary | ICD-10-CM

## 2011-08-28 DIAGNOSIS — R51 Headache: Secondary | ICD-10-CM

## 2011-08-28 DIAGNOSIS — R739 Hyperglycemia, unspecified: Secondary | ICD-10-CM

## 2011-08-28 MED ORDER — TRAMADOL HCL 50 MG PO TABS: 50.0000 mg | ORAL_TABLET | Freq: Three times a day (TID) | ORAL | Status: DC | PRN

## 2011-08-28 MED ORDER — LOSARTAN POTASSIUM-HCTZ 50-12.5 MG PO TABS: 1.0000 | ORAL_TABLET | Freq: Every day | ORAL | Status: DC

## 2011-08-28 NOTE — Patient Instructions (Signed)

## 2011-08-28 NOTE — Progress Notes (Signed)
  Subjective:    Patient here for follow-up of elevated blood pressure.  She is not exercising and is adherent to a low-salt diet.  Blood pressure is not well controlled at home. Cardiac symptoms: none. Patient denies: chest pain, chest pressure/discomfort, claudication, dyspnea, exertional chest pressure/discomfort, fatigue, irregular heart beat, lower extremity edema, near-syncope, orthopnea, palpitations, paroxysmal nocturnal dyspnea, syncope and tachypnea. Cardiovascular risk factors: dyslipidemia, hypertension, obesity (BMI >= 30 kg/m2) and sedentary lifestyle. Use of agents associated with hypertension: none. History of target organ damage: none.  Pt still c/o headaches daily---no relief with tylenol.     The following portions of the patient's history were reviewed and updated as appropriate: allergies, current medications, past family history, past medical history, past social history, past surgical history and problem list.  Review of Systems Pertinent items are noted in HPI.     Objective:    BP 128/72  Pulse 63  Temp(Src) 98.1 F (36.7 C) (Oral)  Wt 251 lb (113.853 kg)  SpO2 97% General appearance: alert, cooperative, appears stated age and no distress Eyes: conjunctivae/corneas clear. PERRL, EOM's intact. Fundi benign. Neck: no adenopathy, no carotid bruit, no JVD, supple, symmetrical, trachea midline and thyroid not enlarged, symmetric, no tenderness/mass/nodules Lungs: clear to auscultation bilaterally Heart: regular rate and rhythm, S1, S2 normal, no murmur, click, rub or gallop Extremities: extremities normal, atraumatic, no cyanosis or edema    Assessment:    Hypertension, normal blood pressure . Evidence of target organ damage: none.    Plan:    Medication: no change. Dietary sodium restriction. Regular aerobic exercise. Follow up: 3 months and as needed.

## 2011-08-29 LAB — HEMOGLOBIN A1C: Hgb A1c MFr Bld: 5.8 % (ref 4.6–6.5)

## 2011-08-29 LAB — BASIC METABOLIC PANEL
Chloride: 99 mEq/L (ref 96–112)
GFR: 74.14 mL/min (ref >60.00)
Potassium: 3.6 mEq/L (ref 3.5–5.1)

## 2011-10-10 ENCOUNTER — Encounter: Payer: Self-pay | Admitting: Family Medicine

## 2011-10-10 ENCOUNTER — Ambulatory Visit (INDEPENDENT_AMBULATORY_CARE_PROVIDER_SITE_OTHER): Payer: Managed Care, Other (non HMO) | Admitting: Family Medicine

## 2011-10-10 VITALS — BP 126/72 | HR 60 | Temp 98.9°F | Wt 254.2 lb

## 2011-10-10 DIAGNOSIS — R7309 Other abnormal glucose: Secondary | ICD-10-CM

## 2011-10-10 DIAGNOSIS — F411 Generalized anxiety disorder: Secondary | ICD-10-CM

## 2011-10-10 DIAGNOSIS — F419 Anxiety disorder, unspecified: Secondary | ICD-10-CM

## 2011-10-10 DIAGNOSIS — I10 Essential (primary) hypertension: Secondary | ICD-10-CM

## 2011-10-10 DIAGNOSIS — E039 Hypothyroidism, unspecified: Secondary | ICD-10-CM

## 2011-10-10 DIAGNOSIS — R739 Hyperglycemia, unspecified: Secondary | ICD-10-CM

## 2011-10-10 MED ORDER — LEVOTHYROXINE SODIUM 112 MCG PO TABS: ORAL_TABLET | ORAL | Status: DC

## 2011-10-10 MED ORDER — FLUOXETINE HCL 40 MG PO CAPS: 40.0000 mg | ORAL_CAPSULE | Freq: Every day | ORAL | Status: DC

## 2011-10-10 NOTE — Progress Notes (Signed)
  Subjective:    Patient here for follow-up of elevated blood pressure.  She is not exercising and is adherent to a low-salt diet.  Blood pressure is well controlled at home. Cardiac symptoms: none. Patient denies: chest pain, chest pressure/discomfort, claudication, dyspnea, exertional chest pressure/discomfort, fatigue, irregular heart beat, lower extremity edema, near-syncope, orthopnea, palpitations, paroxysmal nocturnal dyspnea, syncope and tachypnea. Cardiovascular risk factors: hypertension, obesity (BMI >= 30 kg/m2) and sedentary lifestyle. Use of agents associated with hypertension: none. History of target organ damage: none.  The following portions of the patient's history were reviewed and updated as appropriate: allergies, current medications, past family history, past medical history, past social history, past surgical history and problem list.  Review of Systems Pertinent items are noted in HPI.     Objective:    BP 126/72  Pulse 60  Temp(Src) 98.9 F (37.2 C) (Oral)  Wt 254 lb 3.2 oz (115.304 kg)  SpO2 97% General appearance: alert, cooperative, appears stated age and no distress Lungs: clear to auscultation bilaterally Heart: regular rate and rhythm, S1, S2 normal, no murmur, click, rub or gallop Extremities: extremities normal, atraumatic, no cyanosis or edema    Assessment:    Hypertension, normal blood pressure . Evidence of target organ damage: none.   hypthyroid---con't meds Anxiety--- stable, con't meds Plan:    Medication: no change. Screening labs for initial evaluation: none indicated. Dietary sodium restriction. Regular aerobic exercise. Follow up: 6 months and as needed.

## 2011-10-10 NOTE — Patient Instructions (Signed)

## 2011-11-06 ENCOUNTER — Other Ambulatory Visit: Payer: Managed Care, Other (non HMO)

## 2011-11-17 ENCOUNTER — Other Ambulatory Visit: Payer: Self-pay | Admitting: *Deleted

## 2011-11-17 MED ORDER — FLUTICASONE-SALMETEROL 115-21 MCG/ACT IN AERO: 2.0000 | INHALATION_SPRAY | Freq: Two times a day (BID) | RESPIRATORY_TRACT | Status: DC

## 2011-11-17 NOTE — Telephone Encounter (Signed)
Pt would like to get a refill of her Advair 45/21. Pt states that this is usually filled by her pulmonology but she is trying to get all med transfer to Dr Laury Axon so that it can be more organized..Please advise if you are willing to fill med. Last OV 10-10-11

## 2011-11-17 NOTE — Telephone Encounter (Signed)
Advair 45-21 is not on the medication list Advair 115-21/. Pease advise       KP

## 2011-11-17 NOTE — Telephone Encounter (Signed)
Ok to refill x 6 months 

## 2011-11-20 MED ORDER — FLUTICASONE-SALMETEROL 45-21 MCG/ACT IN AERO: 2.0000 | INHALATION_SPRAY | Freq: Two times a day (BID) | RESPIRATORY_TRACT | Status: DC

## 2011-11-20 NOTE — Telephone Encounter (Signed)
Patient called this morning and stated she left specific details regarding this prescription and is upset instructions were not followed.  Correct medication called in, however it was sent to wrong pharmacy & for wrong qty requested. Patient states must be a 90-day supply due to her insurance co-pay. 30 days or 90-days is going to cost her the same. Also no one called her back as she requested and she is upset about that  Please call patient at work and if patient is not available please leave a detailed mess. On machine at her work # [phone]  Pharmacy used for this medication is [facility] on [address].

## 2011-11-20 NOTE — Addendum Note (Signed)
Addended by: Arnette Norris on: 11/20/2011 01:34 PM   Modules accepted: Orders

## 2011-11-20 NOTE — Telephone Encounter (Signed)
Discussed with patient and she stated she is taking the 45-21 of the Advair and the wrong Rx was sent and it was sent to the wrong pharmacy, she wanted to get a 90 day supply because it is cheaper for her. I re-sent the correct Rx to the correct pharmacy.    KP

## 2011-12-15 ENCOUNTER — Encounter: Payer: Self-pay | Admitting: Family Medicine

## 2011-12-15 ENCOUNTER — Ambulatory Visit (INDEPENDENT_AMBULATORY_CARE_PROVIDER_SITE_OTHER): Payer: Managed Care, Other (non HMO) | Admitting: Family Medicine

## 2011-12-15 VITALS — BP 114/70 | HR 64 | Temp 98.3°F | Wt 252.4 lb

## 2011-12-15 DIAGNOSIS — J329 Chronic sinusitis, unspecified: Secondary | ICD-10-CM

## 2011-12-15 DIAGNOSIS — J3489 Other specified disorders of nose and nasal sinuses: Secondary | ICD-10-CM

## 2011-12-15 MED ORDER — MUPIROCIN CALCIUM 2 % NA OINT: TOPICAL_OINTMENT | Freq: Two times a day (BID) | NASAL | Status: DC

## 2011-12-15 MED ORDER — AMOXICILLIN-POT CLAVULANATE 875-125 MG PO TABS: 1.0000 | ORAL_TABLET | Freq: Two times a day (BID) | ORAL | Status: AC

## 2011-12-15 NOTE — Patient Instructions (Signed)

## 2011-12-15 NOTE — Progress Notes (Signed)
  Subjective:     Cassandra Rios is a 61 y.o. female who presents for evaluation of sore throat. Associated symptoms include hoarseness, nasal blockage, green nasal discharge, pain while swallowing, post nasal drip, sinus and nasal congestion, bilateral sinus pain, sore throat and swollen glands. Onset of symptoms was 1 week ago, and have been gradually worsening since that time. She is drinking plenty of fluids. She has not had a recent close exposure to someone with proven streptococcal pharyngitis.  The following portions of the patient's history were reviewed and updated as appropriate: allergies, current medications, past family history, past medical history, past social history, past surgical history and problem list.  Review of Systems Pertinent items are noted in HPI.    Objective:    BP 114/70  Pulse 64  Temp 98.3 F (36.8 C) (Oral)  Wt 252 lb 6.4 oz (114.488 kg)  SpO2 98% General appearance: alert, cooperative, appears stated age and no distress Ears: normal TM's and external ear canals both ears Nose: green discharge, moderate congestion, turbinates red, swollen, inflamed, + sore R nostril, sinus tenderness bilateral Throat: abnormal findings: mild oropharyngeal erythema and pnd Neck: moderate anterior cervical adenopathy, supple, symmetrical, trachea midline and thyroid not enlarged, symmetric, no tenderness/mass/nodules Lungs: clear to auscultation bilaterally  Laboratory Strep test not done. Results:na.    Assessment:    Acute pharyngitis, likely  Sinusitis with post nasal drip.    Plan:    Patient placed on antibiotics. Use of OTC analgesics recommended as well as salt water gargles. Follow up as needed.

## 2012-01-25 ENCOUNTER — Other Ambulatory Visit (INDEPENDENT_AMBULATORY_CARE_PROVIDER_SITE_OTHER): Payer: Managed Care, Other (non HMO)

## 2012-01-25 DIAGNOSIS — R7309 Other abnormal glucose: Secondary | ICD-10-CM

## 2012-01-25 DIAGNOSIS — E039 Hypothyroidism, unspecified: Secondary | ICD-10-CM

## 2012-01-25 DIAGNOSIS — R739 Hyperglycemia, unspecified: Secondary | ICD-10-CM

## 2012-01-25 DIAGNOSIS — I10 Essential (primary) hypertension: Secondary | ICD-10-CM

## 2012-01-25 LAB — LIPID PANEL
Cholesterol: 142 mg/dL (ref 0–200)
HDL: 54.1 mg/dL (ref >39.00)
VLDL: 18.2 mg/dL (ref 0.0–40.0)

## 2012-01-25 LAB — BASIC METABOLIC PANEL
BUN: 7 mg/dL (ref 6–23)
GFR: 84.53 mL/min (ref >60.00)
Potassium: 4.1 mEq/L (ref 3.5–5.1)

## 2012-01-25 LAB — TSH: TSH: 1.51 u[IU]/mL (ref 0.35–5.50)

## 2012-01-25 LAB — HEPATIC FUNCTION PANEL
ALT: 23 U/L (ref 0–35)
Bilirubin, Direct: 0.1 mg/dL (ref 0.0–0.3)
Total Protein: 7.1 g/dL (ref 6.0–8.3)

## 2012-02-23 ENCOUNTER — Telehealth (INDEPENDENT_AMBULATORY_CARE_PROVIDER_SITE_OTHER): Payer: Self-pay | Admitting: Surgery

## 2012-02-23 NOTE — Telephone Encounter (Signed)
Left voicemail on answering machine stating the patient needs to call [facility] at [phone] to schedule a follow-up appointment....cef

## 2012-06-13 ENCOUNTER — Ambulatory Visit (INDEPENDENT_AMBULATORY_CARE_PROVIDER_SITE_OTHER): Payer: Managed Care, Other (non HMO) | Admitting: Family Medicine

## 2012-06-13 ENCOUNTER — Encounter: Payer: Self-pay | Admitting: Family Medicine

## 2012-06-13 VITALS — BP 120/78 | HR 66 | Temp 98.1°F | Ht 65.5 in | Wt 249.0 lb

## 2012-06-13 DIAGNOSIS — I10 Essential (primary) hypertension: Secondary | ICD-10-CM

## 2012-06-13 DIAGNOSIS — J4489 Other specified chronic obstructive pulmonary disease: Secondary | ICD-10-CM

## 2012-06-13 DIAGNOSIS — J449 Chronic obstructive pulmonary disease, unspecified: Secondary | ICD-10-CM

## 2012-06-13 DIAGNOSIS — G35D Multiple sclerosis, unspecified: Secondary | ICD-10-CM

## 2012-06-13 DIAGNOSIS — J45909 Unspecified asthma, uncomplicated: Secondary | ICD-10-CM

## 2012-06-13 DIAGNOSIS — E039 Hypothyroidism, unspecified: Secondary | ICD-10-CM

## 2012-06-13 DIAGNOSIS — Z0000 Encounter for general adult medical examination without abnormal findings: Secondary | ICD-10-CM

## 2012-06-13 DIAGNOSIS — J329 Chronic sinusitis, unspecified: Secondary | ICD-10-CM

## 2012-06-13 DIAGNOSIS — Z780 Asymptomatic menopausal state: Secondary | ICD-10-CM

## 2012-06-13 DIAGNOSIS — R51 Headache: Secondary | ICD-10-CM

## 2012-06-13 DIAGNOSIS — F419 Anxiety disorder, unspecified: Secondary | ICD-10-CM

## 2012-06-13 DIAGNOSIS — B001 Herpesviral vesicular dermatitis: Secondary | ICD-10-CM

## 2012-06-13 DIAGNOSIS — B009 Herpesviral infection, unspecified: Secondary | ICD-10-CM

## 2012-06-13 DIAGNOSIS — Z1239 Encounter for other screening for malignant neoplasm of breast: Secondary | ICD-10-CM

## 2012-06-13 DIAGNOSIS — E785 Hyperlipidemia, unspecified: Secondary | ICD-10-CM

## 2012-06-13 DIAGNOSIS — G35 Multiple sclerosis: Secondary | ICD-10-CM

## 2012-06-13 MED ORDER — FLUTICASONE-SALMETEROL 45-21 MCG/ACT IN AERO: 2.0000 | INHALATION_SPRAY | Freq: Two times a day (BID) | RESPIRATORY_TRACT | Status: DC

## 2012-06-13 MED ORDER — ATORVASTATIN CALCIUM 20 MG PO TABS: 20.0000 mg | ORAL_TABLET | Freq: Every day | ORAL | Status: DC

## 2012-06-13 MED ORDER — FLUTICASONE PROPIONATE 50 MCG/ACT NA SUSP: 2.0000 | Freq: Every day | NASAL | Status: DC

## 2012-06-13 MED ORDER — TRAMADOL HCL 50 MG PO TABS: 50.0000 mg | ORAL_TABLET | Freq: Three times a day (TID) | ORAL | Status: AC | PRN

## 2012-06-13 MED ORDER — LEVOTHYROXINE SODIUM 112 MCG PO TABS: ORAL_TABLET | ORAL | Status: DC

## 2012-06-13 MED ORDER — FLUOXETINE HCL 40 MG PO CAPS: 40.0000 mg | ORAL_CAPSULE | Freq: Every day | ORAL | Status: DC

## 2012-06-13 MED ORDER — VALACYCLOVIR HCL 1 G PO TABS: 1000.0000 mg | ORAL_TABLET | Freq: Three times a day (TID) | ORAL | Status: DC

## 2012-06-13 MED ORDER — LOSARTAN POTASSIUM-HCTZ 50-12.5 MG PO TABS: 1.0000 | ORAL_TABLET | Freq: Every day | ORAL | Status: DC

## 2012-06-13 NOTE — Patient Instructions (Addendum)
Preventive Care for Adults, Female A healthy lifestyle and preventive care can promote health and wellness. Preventive health guidelines for women include the following key practices.  A routine yearly physical is a good way to check with your caregiver about your health and preventive screening. It is a chance to share any concerns and updates on your health, and to receive a thorough exam.  Visit your dentist for a routine exam and preventive care every 6 months. Brush your teeth twice a day and floss once a day. Good oral hygiene prevents tooth decay and gum disease.  The frequency of eye exams is based on your age, health, family medical history, use of contact lenses, and other factors. Follow your caregiver's recommendations for frequency of eye exams.  Eat a healthy diet. Foods like vegetables, fruits, whole grains, low-fat dairy products, and lean protein foods contain the nutrients you need without too many calories. Decrease your intake of foods high in solid fats, added sugars, and salt. Eat the right amount of calories for you.Get information about a proper diet from your caregiver, if necessary.  Regular physical exercise is one of the most important things you can do for your health. Most adults should get at least 150 minutes of moderate-intensity exercise (any activity that increases your heart rate and causes you to sweat) each week. In addition, most adults need muscle-strengthening exercises on 2 or more days a week.  Maintain a healthy weight. The body mass index (BMI) is a screening tool to identify possible weight problems. It provides an estimate of body fat based on height and weight. Your caregiver can help determine your BMI, and can help you achieve or maintain a healthy weight.For adults 20 years and older:  A BMI below 18.5 is considered underweight.  A BMI of 18.5 to 24.9 is normal.  A BMI of 25 to 29.9 is considered overweight.  A BMI of 30 and above is  considered obese.  Maintain normal blood lipids and cholesterol levels by exercising and minimizing your intake of saturated fat. Eat a balanced diet with plenty of fruit and vegetables. Blood tests for lipids and cholesterol should begin at age 20 and be repeated every 5 years. If your lipid or cholesterol levels are high, you are over 50, or you are at high risk for heart disease, you may need your cholesterol levels checked more frequently.Ongoing high lipid and cholesterol levels should be treated with medicines if diet and exercise are not effective.  If you smoke, find out from your caregiver how to quit. If you do not use tobacco, do not start.  If you are pregnant, do not drink alcohol. If you are breastfeeding, be very cautious about drinking alcohol. If you are not pregnant and choose to drink alcohol, do not exceed 1 drink per day. One drink is considered to be 12 ounces (355 mL) of beer, 5 ounces (148 mL) of wine, or 1.5 ounces (44 mL) of liquor.  Avoid use of street drugs. Do not share needles with anyone. Ask for help if you need support or instructions about stopping the use of drugs.  High blood pressure causes heart disease and increases the risk of stroke. Your blood pressure should be checked at least every 1 to 2 years. Ongoing high blood pressure should be treated with medicines if weight loss and exercise are not effective.  If you are 55 to 63 years old, ask your caregiver if you should take aspirin to prevent strokes.  Diabetes   screening involves taking a blood sample to check your fasting blood sugar level. This should be done once every 3 years, after age 45, if you are within normal weight and without risk factors for diabetes. Testing should be considered at a younger age or be carried out more frequently if you are overweight and have at least 1 risk factor for diabetes.  Breast cancer screening is essential preventive care for women. You should practice "breast  self-awareness." This means understanding the normal appearance and feel of your breasts and may include breast self-examination. Any changes detected, no matter how small, should be reported to a caregiver. Women in their 20s and 30s should have a clinical breast exam (CBE) by a caregiver as part of a regular health exam every 1 to 3 years. After age 40, women should have a CBE every year. Starting at age 40, women should consider having a mammography (breast X-ray test) every year. Women who have a family history of breast cancer should talk to their caregiver about genetic screening. Women at a high risk of breast cancer should talk to their caregivers about having magnetic resonance imaging (MRI) and a mammography every year.  The Pap test is a screening test for cervical cancer. A Pap test can show cell changes on the cervix that might become cervical cancer if left untreated. A Pap test is a procedure in which cells are obtained and examined from the lower end of the uterus (cervix).  Women should have a Pap test starting at age 21.  Between ages 21 and 29, Pap tests should be repeated every 2 years.  Beginning at age 30, you should have a Pap test every 3 years as long as the past 3 Pap tests have been normal.  Some women have medical problems that increase the chance of getting cervical cancer. Talk to your caregiver about these problems. It is especially important to talk to your caregiver if a new problem develops soon after your last Pap test. In these cases, your caregiver may recommend more frequent screening and Pap tests.  The above recommendations are the same for women who have or have not gotten the vaccine for human papillomavirus (HPV).  If you had a hysterectomy for a problem that was not cancer or a condition that could lead to cancer, then you no longer need Pap tests. Even if you no longer need a Pap test, a regular exam is a good idea to make sure no other problems are  starting.  If you are between ages 65 and 70, and you have had normal Pap tests going back 10 years, you no longer need Pap tests. Even if you no longer need a Pap test, a regular exam is a good idea to make sure no other problems are starting.  If you have had past treatment for cervical cancer or a condition that could lead to cancer, you need Pap tests and screening for cancer for at least 20 years after your treatment.  If Pap tests have been discontinued, risk factors (such as a new sexual partner) need to be reassessed to determine if screening should be resumed.  The HPV test is an additional test that may be used for cervical cancer screening. The HPV test looks for the virus that can cause the cell changes on the cervix. The cells collected during the Pap test can be tested for HPV. The HPV test could be used to screen women aged 30 years and older, and should   be used in women of any age who have unclear Pap test results. After the age of 30, women should have HPV testing at the same frequency as a Pap test.  Colorectal cancer can be detected and often prevented. Most routine colorectal cancer screening begins at the age of 50 and continues through age 75. However, your caregiver may recommend screening at an earlier age if you have risk factors for colon cancer. On a yearly basis, your caregiver may provide home test kits to check for hidden blood in the stool. Use of a small camera at the end of a tube, to directly examine the colon (sigmoidoscopy or colonoscopy), can detect the earliest forms of colorectal cancer. Talk to your caregiver about this at age 50, when routine screening begins. Direct examination of the colon should be repeated every 5 to 10 years through age 75, unless early forms of pre-cancerous polyps or small growths are found.  Hepatitis C blood testing is recommended for all people born from 1945 through 1965 and any individual with known risks for hepatitis C.  Practice  safe sex. Use condoms and avoid high-risk sexual practices to reduce the spread of sexually transmitted infections (STIs). STIs include gonorrhea, chlamydia, syphilis, trichomonas, herpes, HPV, and human immunodeficiency virus (HIV). Herpes, HIV, and HPV are viral illnesses that have no cure. They can result in disability, cancer, and death. Sexually active women aged 25 and younger should be checked for chlamydia. Older women with new or multiple partners should also be tested for chlamydia. Testing for other STIs is recommended if you are sexually active and at increased risk.  Osteoporosis is a disease in which the bones lose minerals and strength with aging. This can result in serious bone fractures. The risk of osteoporosis can be identified using a bone density scan. Women ages 65 and over and women at risk for fractures or osteoporosis should discuss screening with their caregivers. Ask your caregiver whether you should take a calcium supplement or vitamin D to reduce the rate of osteoporosis.  Menopause can be associated with physical symptoms and risks. Hormone replacement therapy is available to decrease symptoms and risks. You should talk to your caregiver about whether hormone replacement therapy is right for you.  Use sunscreen with sun protection factor (SPF) of 30 or more. Apply sunscreen liberally and repeatedly throughout the day. You should seek shade when your shadow is shorter than you. Protect yourself by wearing long sleeves, pants, a wide-brimmed hat, and sunglasses year round, whenever you are outdoors.  Once a month, do a whole body skin exam, using a mirror to look at the skin on your back. Notify your caregiver of new moles, moles that have irregular borders, moles that are larger than a pencil eraser, or moles that have changed in shape or color.  Stay current with required immunizations.  Influenza. You need a dose every fall (or winter). The composition of the flu vaccine  changes each year, so being vaccinated once is not enough.  Pneumococcal polysaccharide. You need 1 to 2 doses if you smoke cigarettes or if you have certain chronic medical conditions. You need 1 dose at age 65 (or older) if you have never been vaccinated.  Tetanus, diphtheria, pertussis (Tdap, Td). Get 1 dose of Tdap vaccine if you are younger than age 65, are over 65 and have contact with an infant, are a healthcare worker, are pregnant, or simply want to be protected from whooping cough. After that, you need a Td   booster dose every 10 years. Consult your caregiver if you have not had at least 3 tetanus and diphtheria-containing shots sometime in your life or have a deep or dirty wound.  HPV. You need this vaccine if you are a woman age 26 or younger. The vaccine is given in 3 doses over 6 months.  Measles, mumps, rubella (MMR). You need at least 1 dose of MMR if you were born in 1957 or later. You may also need a second dose.  Meningococcal. If you are age 19 to 21 and a first-year college student living in a residence hall, or have one of several medical conditions, you need to get vaccinated against meningococcal disease. You may also need additional booster doses.  Zoster (shingles). If you are age 60 or older, you should get this vaccine.  Varicella (chickenpox). If you have never had chickenpox or you were vaccinated but received only 1 dose, talk to your caregiver to find out if you need this vaccine.  Hepatitis A. You need this vaccine if you have a specific risk factor for hepatitis A virus infection or you simply wish to be protected from this disease. The vaccine is usually given as 2 doses, 6 to 18 months apart.  Hepatitis B. You need this vaccine if you have a specific risk factor for hepatitis B virus infection or you simply wish to be protected from this disease. The vaccine is given in 3 doses, usually over 6 months. Preventive Services / Frequency Ages 19 to 39  Blood  pressure check.** / Every 1 to 2 years.  Lipid and cholesterol check.** / Every 5 years beginning at age 20.  Clinical breast exam.** / Every 3 years for women in their 20s and 30s.  Pap test.** / Every 2 years from ages 21 through 29. Every 3 years starting at age 30 through age 65 or 70 with a history of 3 consecutive normal Pap tests.  HPV screening.** / Every 3 years from ages 30 through ages 65 to 70 with a history of 3 consecutive normal Pap tests.  Hepatitis C blood test.** / For any individual with known risks for hepatitis C.  Skin self-exam. / Monthly.  Influenza immunization.** / Every year.  Pneumococcal polysaccharide immunization.** / 1 to 2 doses if you smoke cigarettes or if you have certain chronic medical conditions.  Tetanus, diphtheria, pertussis (Tdap, Td) immunization. / A one-time dose of Tdap vaccine. After that, you need a Td booster dose every 10 years.  HPV immunization. / 3 doses over 6 months, if you are 26 and younger.  Measles, mumps, rubella (MMR) immunization. / You need at least 1 dose of MMR if you were born in 1957 or later. You may also need a second dose.  Meningococcal immunization. / 1 dose if you are age 19 to 21 and a first-year college student living in a residence hall, or have one of several medical conditions, you need to get vaccinated against meningococcal disease. You may also need additional booster doses.  Varicella immunization.** / Consult your caregiver.  Hepatitis A immunization.** / Consult your caregiver. 2 doses, 6 to 18 months apart.  Hepatitis B immunization.** / Consult your caregiver. 3 doses usually over 6 months. Ages 40 to 64  Blood pressure check.** / Every 1 to 2 years.  Lipid and cholesterol check.** / Every 5 years beginning at age 20.  Clinical breast exam.** / Every year after age 40.  Mammogram.** / Every year beginning at age 40   and continuing for as long as you are in good health. Consult with your  caregiver.  Pap test.** / Every 3 years starting at age 30 through age 65 or 70 with a history of 3 consecutive normal Pap tests.  HPV screening.** / Every 3 years from ages 30 through ages 65 to 70 with a history of 3 consecutive normal Pap tests.  Fecal occult blood test (FOBT) of stool. / Every year beginning at age 50 and continuing until age 75. You may not need to do this test if you get a colonoscopy every 10 years.  Flexible sigmoidoscopy or colonoscopy.** / Every 5 years for a flexible sigmoidoscopy or every 10 years for a colonoscopy beginning at age 50 and continuing until age 75.  Hepatitis C blood test.** / For all people born from 1945 through 1965 and any individual with known risks for hepatitis C.  Skin self-exam. / Monthly.  Influenza immunization.** / Every year.  Pneumococcal polysaccharide immunization.** / 1 to 2 doses if you smoke cigarettes or if you have certain chronic medical conditions.  Tetanus, diphtheria, pertussis (Tdap, Td) immunization.** / A one-time dose of Tdap vaccine. After that, you need a Td booster dose every 10 years.  Measles, mumps, rubella (MMR) immunization. / You need at least 1 dose of MMR if you were born in 1957 or later. You may also need a second dose.  Varicella immunization.** / Consult your caregiver.  Meningococcal immunization.** / Consult your caregiver.  Hepatitis A immunization.** / Consult your caregiver. 2 doses, 6 to 18 months apart.  Hepatitis B immunization.** / Consult your caregiver. 3 doses, usually over 6 months. Ages 65 and over  Blood pressure check.** / Every 1 to 2 years.  Lipid and cholesterol check.** / Every 5 years beginning at age 20.  Clinical breast exam.** / Every year after age 40.  Mammogram.** / Every year beginning at age 40 and continuing for as long as you are in good health. Consult with your caregiver.  Pap test.** / Every 3 years starting at age 30 through age 65 or 70 with a 3  consecutive normal Pap tests. Testing can be stopped between 65 and 70 with 3 consecutive normal Pap tests and no abnormal Pap or HPV tests in the past 10 years.  HPV screening.** / Every 3 years from ages 30 through ages 65 or 70 with a history of 3 consecutive normal Pap tests. Testing can be stopped between 65 and 70 with 3 consecutive normal Pap tests and no abnormal Pap or HPV tests in the past 10 years.  Fecal occult blood test (FOBT) of stool. / Every year beginning at age 50 and continuing until age 75. You may not need to do this test if you get a colonoscopy every 10 years.  Flexible sigmoidoscopy or colonoscopy.** / Every 5 years for a flexible sigmoidoscopy or every 10 years for a colonoscopy beginning at age 50 and continuing until age 75.  Hepatitis C blood test.** / For all people born from 1945 through 1965 and any individual with known risks for hepatitis C.  Osteoporosis screening.** / A one-time screening for women ages 65 and over and women at risk for fractures or osteoporosis.  Skin self-exam. / Monthly.  Influenza immunization.** / Every year.  Pneumococcal polysaccharide immunization.** / 1 dose at age 65 (or older) if you have never been vaccinated.  Tetanus, diphtheria, pertussis (Tdap, Td) immunization. / A one-time dose of Tdap vaccine if you are over   65 and have contact with an infant, are a healthcare worker, or simply want to be protected from whooping cough. After that, you need a Td booster dose every 10 years.  Varicella immunization.** / Consult your caregiver.  Meningococcal immunization.** / Consult your caregiver.  Hepatitis A immunization.** / Consult your caregiver. 2 doses, 6 to 18 months apart.  Hepatitis B immunization.** / Check with your caregiver. 3 doses, usually over 6 months. ** Family history and personal history of risk and conditions may change your caregiver's recommendations. Document Released: 07/18/2001 Document Revised: 08/14/2011  Document Reviewed: 10/17/2010 [employer] Patient Information 2013 [employer].  

## 2012-06-13 NOTE — Assessment & Plan Note (Signed)
Stable con't meds Check labs 

## 2012-06-13 NOTE — Assessment & Plan Note (Signed)
Check labs 

## 2012-06-13 NOTE — Assessment & Plan Note (Signed)
Per Dr love

## 2012-06-13 NOTE — Progress Notes (Signed)
Subjective:    Patient ID: Cassandra Rios, female    DOB: 09-21-1950, 62 y.o.   MRN: [id number]  HPI    Review of Systems     Objective:   Physical Exam        Assessment & Plan:   Subjective:     Cassandra Rios is a 62 y.o. female and is here for a comprehensive physical exam. The patient reports problems - sore throat-- exposed to strep------cold sore on Left side low lip.  History   Social History  . Marital Status: Married    Spouse Name: N/A    Number of Children: N/A  . Years of Education: N/A   Occupational History  . medical claims management  [employer]   Social History Main Topics  . Smoking status: Former Smoker -- 2.0 packs/day for 35 years    Types: Cigarettes    Quit date: 04/05/2007  . Smokeless tobacco: Not on file  . Alcohol Use: No     Comment: very rare  . Drug Use: No  . Sexually Active: Yes -- Female partner(s)   Other Topics Concern  . Not on file   Social History Narrative  . No narrative on file   Health Maintenance  Topic Date Due  . Influenza Vaccine  02/03/2013  . Mammogram  07/20/2013  . Pap Smear  03/06/2014  . Colonoscopy  06/08/2015  . Tetanus/tdap  03/06/2021  . Zostavax  Completed    The following portions of the patient's history were reviewed and updated as appropriate:  She  has a past medical history of MS (multiple sclerosis); Obesity; Allergic rhinitis; Stomach ulcer from aspirin/ibuprofen-like drugs (NSAID's); Colon polyps; COPD (chronic obstructive pulmonary disease); Hypothyroidism; and Chronic hoarseness. She  does not have any pertinent problems on file. She  has past surgical history that includes Cholecystectomy; Carpal tunnel release; Bariatric Surgery (09/07/08); Hiatal hernia repair (09/07/08); Abdominal hysterectomy; and Cholecystectomy. Her family history includes Arthritis in her sister; COPD in her father and sister; Heart disease in her father and mother; and Stroke (age of onset:77) in her mother. She   reports that she quit smoking about 5 years ago. Her smoking use included Cigarettes. She has a 70 pack-year smoking history. She does not have any smokeless tobacco history on file. She reports that she does not drink alcohol or use illicit drugs. She has a current medication list which includes the following prescription(s): aspirin, atorvastatin, benzonatate, calcium carbonate-vitamin d, fish oil-omega-3 fatty acids, fluoxetine, fluticasone, fluticasone-salmeterol, interferon beta-1b, levothyroxine, losartan-hydrochlorothiazide, multivitamin, mupirocin nasal ointment, and valacyclovir, and the following Facility-Administered Medications: pneumococcal 23 valent vaccine. Current Outpatient Prescriptions on File Prior to Visit  Medication Sig Dispense Refill  . aspirin 81 MG tablet Take 81 mg by mouth daily.      Marland Kitchen atorvastatin (LIPITOR) 20 MG tablet Take 1 tablet (20 mg total) by mouth daily.  90 tablet  3  . benzonatate (TESSALON) 200 MG capsule Take 200 mg by mouth 3 (three) times daily as needed.      . Calcium Carbonate-Vitamin D (CALCIUM 600 + D PO) Take by mouth daily.        . fish oil-omega-3 fatty acids 1000 MG capsule Take 1 g by mouth daily.      Marland Kitchen FLUoxetine (PROZAC) 40 MG capsule Take 1 capsule (40 mg total) by mouth daily.  90 capsule  3  . fluticasone (FLONASE) 50 MCG/ACT nasal spray Place 2 sprays into the nose daily.  16  g  11  . fluticasone-salmeterol (ADVAIR HFA) 45-21 MCG/ACT inhaler Inhale 2 puffs into the lungs 2 (two) times daily.  3 Inhaler  3  . interferon beta-1b (BETASERON) 0.3 MG injection Inject 0.25 mg into the skin every other day.        . levothyroxine (SYNTHROID, LEVOTHROID) 112 MCG tablet 1 po qd  90 tablet  3  . losartan-hydrochlorothiazide (HYZAAR) 50-12.5 MG per tablet Take 1 tablet by mouth daily.  90 tablet  3  . Multiple Vitamin (MULTIVITAMIN) tablet Take 1 tablet by mouth 2 (two) times daily.        . mupirocin nasal ointment (BACTROBAN) 2 % Place into the  nose 2 (two) times daily. Use one-half of tube in each nostril twice daily for five (5) days. After application, press sides of nose together and gently massage.       Current Facility-Administered Medications on File Prior to Visit  Medication Dose Route Frequency Provider Last Rate Last Dose  . pneumococcal 23 valent vaccine (PNU-IMMUNE) injection 0.5 mL  0.5 mL Intramuscular Once Lelon Perla, DO       She is allergic to codeine; hydromorphone hcl; ibuprofen; and morphine sulfate..  Review of Systems Review of Systems  Constitutional: Negative for activity change, appetite change and fatigue.  HENT: Negative for hearing loss, congestion, tinnitus and ear discharge.  dentist q9m Eyes: Negative for visual disturbance (see optho q1y -- vision corrected to 20/20 with glasses).  Respiratory: Negative for cough, chest tightness and shortness of breath.   Cardiovascular: Negative for chest pain, palpitations and leg swelling.  Gastrointestinal: Negative for abdominal pain, diarrhea, constipation and abdominal distention.  Genitourinary: Negative for urgency, frequency, decreased urine volume and difficulty urinating.  Musculoskeletal: Negative for back pain, arthralgias and gait problem.  Skin: Negative for color change, pallor and rash.  Neurological: Negative for dizziness, light-headedness, numbness and headaches.  Hematological: Negative for adenopathy. Does not bruise/bleed easily.  Psychiatric/Behavioral: Negative for suicidal ideas, confusion, sleep disturbance, self-injury, dysphoric mood, decreased concentration and agitation.       Objective:    BP 120/78  Pulse 66  Temp 98.1 F (36.7 C) (Oral)  Ht 5' 5.5" (1.664 m)  Wt 249 lb (112.946 kg)  BMI 40.81 kg/m2  SpO2 97% General appearance: alert, cooperative, appears stated age and no distress Head: Normocephalic, without obvious abnormality, atraumatic Eyes: negative findings: lids and lashes normal, conjunctivae and  sclerae normal and pupils equal, round, reactive to light and accomodation Ears: normal TM's and external ear canals both ears Nose: Nares normal. Septum midline. Mucosa normal. No drainage or sinus tenderness. Throat: lips, mucosa, and tongue normal; teeth and gums normal Neck: no adenopathy, no carotid bruit, no JVD, supple, symmetrical, trachea midline and thyroid not enlarged, symmetric, no tenderness/mass/nodules Back: symmetric, no curvature. ROM normal. No CVA tenderness. Lungs: clear to auscultation bilaterally Breasts: normal appearance, no masses or tenderness Heart: regular rate and rhythm, S1, S2 normal, no murmur, click, rub or gallop Abdomen: soft, non-tender; bowel sounds normal; no masses,  no organomegaly Pelvic: not indicated; status post hysterectomy, negative ROS Extremities: extremities normal, atraumatic, no cyanosis or edema Pulses: 2+ and symmetric Skin: Skin color, texture, turgor normal. No rashes or lesions Lymph nodes: Cervical, supraclavicular, and axillary nodes normal. Neurologic: Alert and oriented X 3, normal strength and tone. Normal symmetric reflexes. Normal coordination and gait psych--  no depression, anxiety    Assessment:    Healthy female exam.      Plan:  Check labs ghm utd   See After Visit Summary for Counseling Recommendations

## 2012-06-13 NOTE — Assessment & Plan Note (Signed)
Stable Refill inhalers  

## 2012-06-17 ENCOUNTER — Other Ambulatory Visit (INDEPENDENT_AMBULATORY_CARE_PROVIDER_SITE_OTHER): Payer: Managed Care, Other (non HMO)

## 2012-06-17 DIAGNOSIS — I10 Essential (primary) hypertension: Secondary | ICD-10-CM

## 2012-06-17 DIAGNOSIS — E039 Hypothyroidism, unspecified: Secondary | ICD-10-CM

## 2012-06-17 DIAGNOSIS — Z0000 Encounter for general adult medical examination without abnormal findings: Secondary | ICD-10-CM

## 2012-06-17 DIAGNOSIS — Z1239 Encounter for other screening for malignant neoplasm of breast: Secondary | ICD-10-CM

## 2012-06-17 DIAGNOSIS — E785 Hyperlipidemia, unspecified: Secondary | ICD-10-CM

## 2012-06-17 LAB — HEPATIC FUNCTION PANEL
ALT: 20 U/L (ref 0–35)
AST: 24 U/L (ref 0–37)
Alkaline Phosphatase: 62 U/L (ref 39–117)
Bilirubin, Direct: 0.1 mg/dL (ref 0.0–0.3)
Total Protein: 7.1 g/dL (ref 6.0–8.3)

## 2012-06-17 LAB — CBC WITH DIFFERENTIAL/PLATELET
Eosinophils Absolute: 0.2 10*3/uL (ref 0.0–0.7)
Eosinophils Relative: 3.7 % (ref 0.0–5.0)
MCHC: 33 g/dL (ref 30.0–36.0)
MCV: 90.1 fl (ref 78.0–100.0)
Monocytes Absolute: 0.5 10*3/uL (ref 0.1–1.0)
Neutrophils Relative %: 58.3 % (ref 43.0–77.0)
Platelets: 112 10*3/uL — ABNORMAL LOW (ref 150.0–400.0)
WBC: 5.7 10*3/uL (ref 4.5–10.5)

## 2012-06-17 LAB — TSH: TSH: 0.83 u[IU]/mL (ref 0.35–5.50)

## 2012-06-17 LAB — BASIC METABOLIC PANEL
BUN: 14 mg/dL (ref 6–23)
Chloride: 103 mEq/L (ref 96–112)
Creatinine, Ser: 1 mg/dL (ref 0.4–1.2)

## 2012-06-17 LAB — LIPID PANEL
Cholesterol: 140 mg/dL (ref 0–200)
LDL Cholesterol: 79 mg/dL (ref 0–99)

## 2012-07-04 ENCOUNTER — Telehealth: Payer: Self-pay | Admitting: Family Medicine

## 2012-07-04 NOTE — Telephone Encounter (Signed)
Patient states that her physical(DOS 06/29/2012) was coded wrong. She says that her physical was coded with a medical Dx and now her insurance will not pay for this. Also, she says that we filed her throat culture as a 2nd office visit. She would like to know if we could fix this.

## 2012-07-05 NOTE — Telephone Encounter (Signed)
Will ask billing/coder to review the codes for this visit.

## 2012-08-21 ENCOUNTER — Encounter: Payer: Self-pay | Admitting: Family Medicine

## 2012-08-28 ENCOUNTER — Encounter: Payer: Self-pay | Admitting: Family Medicine

## 2012-10-31 ENCOUNTER — Encounter: Payer: Self-pay | Admitting: Neurology

## 2012-10-31 ENCOUNTER — Ambulatory Visit (INDEPENDENT_AMBULATORY_CARE_PROVIDER_SITE_OTHER): Payer: Medicare HMO | Admitting: Neurology

## 2012-10-31 VITALS — BP 132/72 | HR 62 | Wt 240.0 lb

## 2012-10-31 DIAGNOSIS — G35 Multiple sclerosis: Secondary | ICD-10-CM

## 2012-10-31 NOTE — Progress Notes (Signed)
Reason for visit: Multiple sclerosis  Cassandra Rios is an 62 y.o. female  History of present illness:  Ms. Cassandra Rios is a 62 year old right-handed white female with a history of multiple sclerosis. The patient currently is on Betaseron therapy, and she is tolerating this fairly well. The patient reports some gradually increasing problems with fatigue. The patient has a history of morbid obesity, and she had a LAP-BAND procedure to help her lose weight. The patient has regained about 30 pounds of weight. The patient indicates that she does snore at night, and she has excessive daytime drowsiness. The patient has had a sleep study done in the past that did not show significant sleep apnea. The patient reports no new symptoms of numbness or weakness of the face, arms, or legs. The patient not had any new visual complaints, or problems controlling the bowels or the bladder. The patient reports no significant issues with balance. The patient returns to this office for an evaluation.  Past Medical History  Diagnosis Date  . MS (multiple sclerosis)   . Obesity   . Allergic rhinitis   . Stomach ulcer from aspirin/ibuprofen-like drugs (NSAID's)   . Colon polyps   . COPD (chronic obstructive pulmonary disease)     Dr. Marchelle Gearing  . Hypothyroidism   . Chronic hoarseness   . Hypertension   . Dyslipidemia     Past Surgical History  Procedure Laterality Date  . Cholecystectomy    . Carpal tunnel release    . Bariatric surgery  09/07/08    lap band  . Hiatal hernia repair  09/07/08  . Abdominal hysterectomy    . Cholecystectomy      Family History  Problem Relation Age of Onset  . COPD Father   . Heart disease Father   . COPD Sister   . Arthritis Sister   . Stroke Mother 64  . Heart disease Mother     congenital heart defect?    Social history:  reports that she quit smoking about 5 years ago. Her smoking use included Cigarettes. She has a 70 pack-year smoking history. She does not  have any smokeless tobacco history on file. She reports that she does not drink alcohol or use illicit drugs.  Allergies:  Allergies  Allergen Reactions  . Codeine   . Hydromorphone Hcl     REACTION: nausea/vomiting  . Ibuprofen     REACTION: stomach ulcers  . Morphine Sulfate     Medications:  Current Outpatient Prescriptions on File Prior to Visit  Medication Sig Dispense Refill  . aspirin 81 MG tablet Take 81 mg by mouth daily.      Marland Kitchen atorvastatin (LIPITOR) 20 MG tablet Take 1 tablet (20 mg total) by mouth daily.  90 tablet  3  . Calcium Carbonate-Vitamin D (CALCIUM 600 + D PO) Take by mouth daily.        . fish oil-omega-3 fatty acids 1000 MG capsule Take 1 g by mouth daily.      Marland Kitchen FLUoxetine (PROZAC) 40 MG capsule Take 1 capsule (40 mg total) by mouth daily.  90 capsule  3  . fluticasone (FLONASE) 50 MCG/ACT nasal spray Place 2 sprays into the nose daily.  16 g  11  . interferon beta-1b (BETASERON) 0.3 MG injection Inject 0.25 mg into the skin every other day.        . levothyroxine (SYNTHROID, LEVOTHROID) 112 MCG tablet 1 po qd  90 tablet  3  . losartan-hydrochlorothiazide (HYZAAR) 50-12.5  MG per tablet Take 1 tablet by mouth daily.  90 tablet  3  . Multiple Vitamin (MULTIVITAMIN) tablet Take 1 tablet by mouth 2 (two) times daily.         Current Facility-Administered Medications on File Prior to Visit  Medication Dose Route Frequency Provider Last Rate Last Dose  . pneumococcal 23 valent vaccine (PNU-IMMUNE) injection 0.5 mL  0.5 mL Intramuscular Once Lelon Perla, DO        ROS:  Out of a complete 14 system review of symptoms, the patient complains only of the following symptoms, and all other reviewed systems are negative.  Fatigue Eye pain Snoring Joint pain, achy muscles Allergies Headache, weakness, snoring  Blood pressure 132/72, pulse 62, weight 240 lb (108.863 kg).  Physical Exam  General: The patient is alert and cooperative at the time of the  examination. The patient is markedly obese.  Skin: No significant peripheral edema is noted.   Neurologic Exam  Cranial nerves: Facial symmetry is present. Speech is normal, no aphasia or dysarthria is noted. Extraocular movements are full. Visual fields are full. Pupils are equal, round, and reactive to light. Discs are flat bilaterally.  Motor: The patient has good strength in all 4 extremities.  Coordination: The patient has good finger-nose-finger and heel-to-shin bilaterally.  Gait and station: The patient has a normal gait. Tandem gait is normal. Romberg is negative. No drift is seen.  Reflexes: Deep tendon reflexes are symmetric.   Assessment/Plan:  1. Multiple sclerosis  The patient will continue on Betaseron for now. The patient does report excessive daytime drowsiness, and if this continues to worsen, a repeat sleep study should be done in the future. The patient has had a recent MRI of the brain done in December 2013 showing very minimal progression of the last 3 years. The patient will remain on Betaseron for now, and we will followup with her in 6 months. The patient has had recent blood work done to include a CBC and liver profile.  Marlan Palau MD 10/31/2012 9:29 PM  [facility] [address] [place], [place] [address]  Phone [phone] Fax [phone]

## 2012-11-05 ENCOUNTER — Ambulatory Visit (INDEPENDENT_AMBULATORY_CARE_PROVIDER_SITE_OTHER): Payer: Managed Care, Other (non HMO) | Admitting: Family Medicine

## 2012-11-05 ENCOUNTER — Encounter: Payer: Self-pay | Admitting: Family Medicine

## 2012-11-05 VITALS — BP 136/84 | HR 72 | Temp 98.3°F | Wt 239.6 lb

## 2012-11-05 DIAGNOSIS — R079 Chest pain, unspecified: Secondary | ICD-10-CM

## 2012-11-05 DIAGNOSIS — F341 Dysthymic disorder: Secondary | ICD-10-CM

## 2012-11-05 DIAGNOSIS — F418 Other specified anxiety disorders: Secondary | ICD-10-CM

## 2012-11-05 DIAGNOSIS — F411 Generalized anxiety disorder: Secondary | ICD-10-CM

## 2012-11-05 MED ORDER — ALPRAZOLAM 0.5 MG PO TABS: 0.5000 mg | ORAL_TABLET | Freq: Every evening | ORAL | Status: DC | PRN

## 2012-11-07 ENCOUNTER — Encounter: Payer: Self-pay | Admitting: Family Medicine

## 2012-11-07 NOTE — Assessment & Plan Note (Addendum)
Xanax 0.25 mg 1 po tid prn  prozac 40 mg daily rto 1 month or sooner prn

## 2012-11-07 NOTE — Progress Notes (Signed)
Subjective:    Patient ID: Cassandra Rios, female    DOB: [birth year], 61 y.o.   MRN: [id number]  HPI Pt is here c/o inc anxiety and depression since finding her son's girlfriend hanging [place] the [place].  She had been there10 hours. Pt has been having chest pain since as well.  No sob, no palp.    Review of Systems As above    Objective:   Physical Exam BP 136/84  Pulse 72  Temp(Src) 98.3 F (36.8 C) (Oral)  Wt 239 lb 9.6 oz (108.682 kg)  BMI 39.25 kg/m2  SpO2 98% General appearance: alert, cooperative, appears stated age and no distress Neurologic: Alert and oriented X 3, normal strength and tone. Normal symmetric reflexes. Normal coordination and gait Psych-- teary eyed and angry about situation  EKG--NSR     Assessment & Plan:

## 2012-11-08 ENCOUNTER — Telehealth: Payer: Self-pay | Admitting: General Practice

## 2012-11-08 NOTE — Telephone Encounter (Signed)
Im working on it  

## 2012-11-08 NOTE — Telephone Encounter (Signed)
Lyla Son from [place] called in regards to pt's short term disability paperwork called [employer]. Please advise if you have seen this as it is due back to [facility] by the 11th of June.   Callback for [employer]

## 2012-11-29 ENCOUNTER — Other Ambulatory Visit: Payer: Self-pay

## 2012-11-29 MED ORDER — INTERFERON BETA-1B 0.3 MG ~~LOC~~ KIT: 0.2500 mg | PACK | SUBCUTANEOUS | Status: DC

## 2012-12-05 ENCOUNTER — Telehealth: Payer: Self-pay | Admitting: *Deleted

## 2012-12-05 DIAGNOSIS — I10 Essential (primary) hypertension: Secondary | ICD-10-CM

## 2012-12-05 MED ORDER — LOSARTAN POTASSIUM-HCTZ 50-12.5 MG PO TABS: 1.0000 | ORAL_TABLET | Freq: Every day | ORAL | Status: DC

## 2012-12-05 NOTE — Telephone Encounter (Signed)
Pt returned your call. She confirmed that [facility] does use [facility] [address] and would like her rx sent there.

## 2012-12-05 NOTE — Telephone Encounter (Signed)
Last OV Dr. Laury Axon 6.3.14. RX last sent to [facility] at [facility] on 1.19.14. Confirmed RX was on file there with refills left. Left message on pt. Voicemail to return call to verify which pharmacy she is currently using to resend the RX. Will await callback.

## 2012-12-05 NOTE — Telephone Encounter (Signed)
Pt. Called triage line requesting refill on losartan-HCTZ 50-12.5. Pt states she has no more of this medication left. May leave message on voicemail [phone]. Pt. [facility] [address].

## 2012-12-05 NOTE — Telephone Encounter (Signed)
Confirmed with [facility] pharmacy, pt. Has never picked up the rx that was sent there 06/13/12. Asked to cancel this prescription and rx [facility]sent [facility] [address] per patient request and per protocol. Pt. Contacted and updated on this information

## 2013-01-09 ENCOUNTER — Encounter: Payer: Self-pay | Admitting: Lab

## 2013-01-10 ENCOUNTER — Ambulatory Visit (INDEPENDENT_AMBULATORY_CARE_PROVIDER_SITE_OTHER): Payer: Managed Care, Other (non HMO) | Admitting: Family Medicine

## 2013-01-10 ENCOUNTER — Encounter: Payer: Self-pay | Admitting: Family Medicine

## 2013-01-10 VITALS — BP 120/60 | HR 70 | Temp 97.7°F | Ht 65.5 in | Wt 244.0 lb

## 2013-01-10 DIAGNOSIS — E785 Hyperlipidemia, unspecified: Secondary | ICD-10-CM

## 2013-01-10 DIAGNOSIS — N6310 Unspecified lump in the right breast, unspecified quadrant: Secondary | ICD-10-CM | POA: Insufficient documentation

## 2013-01-10 DIAGNOSIS — N63 Unspecified lump in unspecified breast: Secondary | ICD-10-CM

## 2013-01-10 LAB — LIPID PANEL
Cholesterol: 157 mg/dL (ref 0–200)
HDL: 47.7 mg/dL (ref >39.00)
Triglycerides: 107 mg/dL (ref 0.0–149.0)
VLDL: 21.4 mg/dL (ref 0.0–40.0)

## 2013-01-10 LAB — BASIC METABOLIC PANEL
BUN: 14 mg/dL (ref 6–23)
Calcium: 9.7 mg/dL (ref 8.4–10.5)
GFR: 63.16 mL/min (ref >60.00)
Glucose, Bld: 100 mg/dL — ABNORMAL HIGH (ref 70–99)

## 2013-01-10 LAB — HEPATIC FUNCTION PANEL
Albumin: 3.9 g/dL (ref 3.5–5.2)
Total Bilirubin: 0.6 mg/dL (ref 0.3–1.2)

## 2013-01-10 NOTE — Patient Instructions (Signed)

## 2013-01-10 NOTE — Assessment & Plan Note (Signed)
Check labs con't meds 

## 2013-01-10 NOTE — Progress Notes (Signed)
Subjective:    Patient ID: Cassandra Rios, female    DOB: 1950-11-29, 62 y.o.   MRN: [id number]  HPI Pt is here for f/u cholesterol and c/o mass palpated on R breast x several days.  She states she did have a bruise in that area but it has almost completely resolved.   No known injury.  + tender in the area.    Review of Systems    as above Objective:   Physical Exam BP 120/60  Pulse 70  Temp(Src) 97.7 F (36.5 C) (Oral)  Ht 5' 5.5" (1.664 m)  Wt 244 lb (110.678 kg)  BMI 39.97 kg/m2  SpO2 98% General appearance: alert, cooperative, appears stated age and no distress Breasts: positive findings: firm and tender nodule located on the right upper outer quadrant--  11 o'clock--- some discoloration of skin in the area as well c/w resolving hematoma        Assessment & Plan:

## 2013-01-10 NOTE — Assessment & Plan Note (Signed)
May be residual hematoma Diagnostic mammogram

## 2013-02-25 ENCOUNTER — Telehealth: Payer: Self-pay | Admitting: Neurology

## 2013-02-25 NOTE — Telephone Encounter (Signed)
Dr. Anne Hahn, Is there any contraindication to this patient getting her flu vaccine?

## 2013-02-25 NOTE — Telephone Encounter (Signed)
I called patient. I do not believe that there are any contraindications to getting the flu shot. The patient needs a note regarding this. I will write a note.

## 2013-02-26 ENCOUNTER — Telehealth: Payer: Self-pay | Admitting: *Deleted

## 2013-04-07 ENCOUNTER — Ambulatory Visit (INDEPENDENT_AMBULATORY_CARE_PROVIDER_SITE_OTHER): Payer: Managed Care, Other (non HMO) | Admitting: Family Medicine

## 2013-04-07 ENCOUNTER — Encounter: Payer: Self-pay | Admitting: Family Medicine

## 2013-04-07 VITALS — BP 132/60 | HR 65 | Temp 98.6°F | Wt 244.0 lb

## 2013-04-07 DIAGNOSIS — R059 Cough, unspecified: Secondary | ICD-10-CM

## 2013-04-07 DIAGNOSIS — J019 Acute sinusitis, unspecified: Secondary | ICD-10-CM

## 2013-04-07 DIAGNOSIS — R05 Cough: Secondary | ICD-10-CM

## 2013-04-07 MED ORDER — CEFUROXIME AXETIL 500 MG PO TABS: 500.0000 mg | ORAL_TABLET | Freq: Two times a day (BID) | ORAL | Status: AC

## 2013-04-07 MED ORDER — GUAIFENESIN-CODEINE 100-10 MG/5ML PO SYRP: ORAL_SOLUTION | ORAL | Status: DC

## 2013-04-07 NOTE — Progress Notes (Signed)
  Subjective:     Cassandra Rios is a 62 y.o. female who presents for evaluation of sinus pain. Symptoms include: congestion, facial pain, headaches, nasal congestion, purulent rhinorrhea and sinus pressure. Onset of symptoms was 5 days ago. Symptoms have been gradually worsening since that time. Past history is significant for no history of pneumonia or bronchitis. Patient is a former smoker.Marland Kitchen a The following portions of the patient's history were reviewed and updated as appropriate: allergies, current medications, past family history, past medical history, past social history, past surgical history and problem list.  Review of Systems Pertinent items are noted in HPI.   Objective:    BP 132/60  Pulse 65  Temp(Src) 98.6 F (37 C) (Oral)  Wt 244 lb (110.678 kg)  SpO2 98% General appearance: alert, cooperative, appears stated age and no distress Ears: normal TM's and external ear canals both ears Nose: green discharge, moderate congestion, turbinates red, swollen, sinus tenderness bilateral Throat: abnormal findings: mild oropharyngeal erythema Neck: moderate anterior cervical adenopathy, supple, symmetrical, trachea midline and thyroid not enlarged, symmetric, no tenderness/mass/nodules Lungs: clear to auscultation bilaterally    Assessment:    Acute bacterial sinusitis.    Plan:    Nasal steroids per medication orders. Antihistamines per medication orders. Ceftin per medication orders.

## 2013-04-07 NOTE — Patient Instructions (Signed)

## 2013-04-15 ENCOUNTER — Encounter: Payer: Self-pay | Admitting: Family Medicine

## 2013-04-21 NOTE — Telephone Encounter (Signed)
Letter received.

## 2013-05-08 ENCOUNTER — Ambulatory Visit (INDEPENDENT_AMBULATORY_CARE_PROVIDER_SITE_OTHER): Payer: Medicare HMO | Admitting: Neurology

## 2013-05-08 ENCOUNTER — Encounter: Payer: Self-pay | Admitting: Neurology

## 2013-05-08 ENCOUNTER — Encounter (INDEPENDENT_AMBULATORY_CARE_PROVIDER_SITE_OTHER): Payer: Self-pay

## 2013-05-08 VITALS — BP 158/72 | HR 54 | Wt 258.0 lb

## 2013-05-08 DIAGNOSIS — G35 Multiple sclerosis: Secondary | ICD-10-CM

## 2013-05-08 MED ORDER — LOSARTAN POTASSIUM-HCTZ 100-12.5 MG PO TABS: 1.0000 | ORAL_TABLET | Freq: Every day | ORAL | Status: DC

## 2013-05-08 NOTE — Patient Instructions (Signed)
Multiple Sclerosis Multiple sclerosis (MS) is a disease of the central nervous system. Its cause is unknown. It is more common in the northern states than in the southern states. There is a higher incidence of MS in women. There is a wide variation in the symptoms (problems) of MS. This is because of the many different ways it affects the central nervous system. It often comes on in episodes or attacks. These attacks may last weeks to months. There may be long periods of nearly no problems between attacks. The main symptoms include visual problems (associated with eye pain), numbness, weakness, and paralysis in extremities (arms/hands and legs/feet). There may also be tremors and problems with balance and walking. The age when MS starts is variable. Advances in medicine continue to improve the treatment of this illness. There is no known cure for MS but there are medications that help. MS is not an inherited illness, although your risk of getting this disease is higher if you have a relative with MS. The best radiologic (x-ray) study for MS is an MRI (magnetic resonance imaging). There are medications available to decrease the number and frequency of attacks. SYMPTOMS  The symptoms of MS are caused by loss of insulation (myelin) of the nerves of the brain. When this happens, brain signals do not get transmitted properly or may not get transmitted at all. Some of the problems caused by this include:   Numbness.  Weakness.  Paralysis in extremities.  Visual problems, eye pain.  Balance problems.  Tremors. DIAGNOSIS  Your caregiver can do studies on you to make this diagnosis. This may include specialized X-rays and spinal fluid studies. HOME CARE INSTRUCTIONS   Take medications as directed by your caregiver. Baclofen is a drug commonly used to reduce muscle spasticity. Steroids are often used for short term relief.  Exercise as directed.  Use physical and occupational therapy as directed by  your caregiver. Careful attention to this medical care can help avoid depression.  See your caregiver if you begin to have problems with depression. This is a common problem in MS. Patients often continue to work many years after the diagnosis of MS. Document Released: 05/19/2000 Document Revised: 08/14/2011 Document Reviewed: 12/26/2006 [employer] Patient Information 2014 [employer].  

## 2013-05-08 NOTE — Progress Notes (Signed)
Reason for visit: Multiple sclerosis  Cassandra Rios is an 62 y.o. female  History of present illness:  Cassandra Rios is a 62 year old right-handed white female with a history of obesity and multiple sclerosis. The patient is on Betaseron, and she is tolerating the medication relatively well, but she indicates that she has headaches the morning after the injection on a regular basis. The patient indicates that she has had no change in her clinical condition. There have been no new issues with numbness, weakness, balance problems, visual changes, or cognitive changes. The patient denies any problems controlling the bowels or the bladder. The patient returns to the office today for an evaluation. The patient does report some intermittent back pain. The patient has had an elevation in her blood pressure recently, and she is on Hyzaar for this in low dose. The patient has monitored her blood pressures at home, and they have been persistently running in the 150 systolic range.  Past Medical History  Diagnosis Date  . MS (multiple sclerosis)   . Obesity   . Allergic rhinitis   . Stomach ulcer from aspirin/ibuprofen-like drugs (NSAID's)   . Colon polyps   . COPD (chronic obstructive pulmonary disease)     Dr. Marchelle Gearing  . Hypothyroidism   . Chronic hoarseness   . Hypertension   . Dyslipidemia     Past Surgical History  Procedure Laterality Date  . Cholecystectomy    . Carpal tunnel release    . Bariatric surgery  09/07/08    lap band  . Hiatal hernia repair  09/07/08  . Abdominal hysterectomy    . Cholecystectomy      Family History  Problem Relation Age of Onset  . COPD Father   . Heart disease Father   . COPD Sister   . Arthritis Sister   . Stroke Mother 69  . Heart disease Mother     congenital heart defect?    Social history:  reports that she quit smoking about 6 years ago. Her smoking use included Cigarettes. She has a 70 pack-year smoking history. She does not have  any smokeless tobacco history on file. She reports that she does not drink alcohol or use illicit drugs.    Allergies  Allergen Reactions  . Codeine   . Hydromorphone Hcl     REACTION: nausea/vomiting  . Ibuprofen     REACTION: stomach ulcers  . Morphine Sulfate     Medications:  Current Outpatient Prescriptions on File Prior to Visit  Medication Sig Dispense Refill  . aspirin 81 MG tablet Take 81 mg by mouth daily.      Marland Kitchen atorvastatin (LIPITOR) 20 MG tablet Take 1 tablet (20 mg total) by mouth daily.  90 tablet  3  . Calcium Carbonate-Vitamin D (CALCIUM 600 + D PO) Take by mouth daily.        Marland Kitchen FLUoxetine (PROZAC) 40 MG capsule Take 1 capsule (40 mg total) by mouth daily.  90 capsule  3  . fluticasone (FLONASE) 50 MCG/ACT nasal spray Place 2 sprays into the nose daily.  16 g  11  . Interferon Beta-1b (BETASERON) 0.3 MG KIT injection Inject 0.25 mg into the skin every other day.  1 kit  6  . levothyroxine (SYNTHROID, LEVOTHROID) 112 MCG tablet 1 po qd  90 tablet  3  . Multiple Vitamin (MULTIVITAMIN) tablet Take 1 tablet by mouth 2 (two) times daily.         No current facility-administered  medications on file prior to visit.    ROS:  Out of a complete 14 system review of symptoms, the patient complains only of the following symptoms, and all other reviewed systems are negative.  Fatigue Eye pain Snoring Feeling hot Joint pain, achy muscles Allergies  Blood pressure 158/72, pulse 54, weight 258 lb (117.028 kg).  Physical Exam  General: The patient is alert and cooperative at the time of the examination.  Skin: No significant peripheral edema is noted.   Neurologic Exam  Mental status: The patient is oriented x 3.  Cranial nerves: Facial symmetry is present. Speech is normal, no aphasia or dysarthria is noted. Extraocular movements are full. Visual fields are full.  Motor: The patient has good strength in all 4 extremities.  Sensory examination: Soft touch  sensation is symmetric on the face, arms, and legs.  Coordination: The patient has good finger-nose-finger and heel-to-shin bilaterally.  Gait and station: The patient has a normal gait. Tandem gait is normal. Romberg is negative. No drift is seen.  Reflexes: Deep tendon reflexes are symmetric.   Assessment/Plan:  1. Multiple sclerosis  2. Hypertension  3. Obesity  The patient is doing well on Betaseron, and she will continue this. Her primary care physician is checking annual blood work. The patient will go up on her blood pressure medications to the Hyzaar, to the 100/12.5 mg tablet. The patient will followup through this office in about 6 months.  Marlan Palau MD 05/08/2013 8:43 PM  [facility] [address] [place], [place] [address]  Phone [phone] Fax [phone]

## 2013-06-27 ENCOUNTER — Telehealth: Payer: Self-pay

## 2013-06-27 NOTE — Telephone Encounter (Signed)
Medication and allergies:  Reviewed and updated  90 day supply/mail order: [facility]. Home Delivery Local pharmacy:  [facility] and [address]   Immunizations due:  UTD   A/P: No changes to personal, family history or past surgical hx PAP- Would like to receive one during her visit. CCS- 06/07/10- polys and hemorrhoids. Recommended every 3 years. (pt.aware) MMG- 01/13/13- normal BD- 07/22/12-Osteopenia Flu- 02/27/13 per pt. Tdap- 03/07/11 Shingles- 02/28/10  To Discuss with Provider: Needs refills on the following medication:  Fluoxetine   Fluticasone  Levothyroxine  Losartan-hydrochlorothiazide (needs 90 day prescription)  Tramadol She would like paper prescriptions  She also wants to discuss switching Lipitor to a generic version of a cholesterol med without the side effects.

## 2013-06-27 NOTE — Telephone Encounter (Signed)
Left message for call back. Identifiable  Pap CCS-06/07/10- polyps and hemorrhoids.  Repeat in 3-4 years.  MMG- 01/13/13- normal BD- 07/22/12-osteopenia Flu Tdap- 03/07/11 Shingles- 02/28/10

## 2013-06-30 ENCOUNTER — Encounter: Payer: Self-pay | Admitting: Family Medicine

## 2013-06-30 ENCOUNTER — Other Ambulatory Visit (HOSPITAL_COMMUNITY)
Admission: RE | Admit: 2013-06-30 | Discharge: 2013-06-30 | Disposition: A | Discharge status: Home / Self Care | Payer: Managed Care, Other (non HMO) | Source: Ambulatory Visit | Attending: Family Medicine | Admitting: Family Medicine

## 2013-06-30 ENCOUNTER — Ambulatory Visit (INDEPENDENT_AMBULATORY_CARE_PROVIDER_SITE_OTHER): Payer: Managed Care, Other (non HMO) | Admitting: Family Medicine

## 2013-06-30 VITALS — BP 130/62 | HR 62 | Temp 98.2°F | Ht 66.0 in | Wt 247.8 lb

## 2013-06-30 DIAGNOSIS — E039 Hypothyroidism, unspecified: Secondary | ICD-10-CM

## 2013-06-30 DIAGNOSIS — E785 Hyperlipidemia, unspecified: Secondary | ICD-10-CM

## 2013-06-30 DIAGNOSIS — Z1272 Encounter for screening for malignant neoplasm of vagina: Secondary | ICD-10-CM

## 2013-06-30 DIAGNOSIS — R739 Hyperglycemia, unspecified: Secondary | ICD-10-CM

## 2013-06-30 DIAGNOSIS — Z01419 Encounter for gynecological examination (general) (routine) without abnormal findings: Secondary | ICD-10-CM | POA: Insufficient documentation

## 2013-06-30 DIAGNOSIS — F411 Generalized anxiety disorder: Secondary | ICD-10-CM

## 2013-06-30 DIAGNOSIS — E6609 Other obesity due to excess calories: Secondary | ICD-10-CM | POA: Insufficient documentation

## 2013-06-30 DIAGNOSIS — Z0000 Encounter for general adult medical examination without abnormal findings: Secondary | ICD-10-CM

## 2013-06-30 DIAGNOSIS — I10 Essential (primary) hypertension: Secondary | ICD-10-CM

## 2013-06-30 DIAGNOSIS — R7309 Other abnormal glucose: Secondary | ICD-10-CM

## 2013-06-30 DIAGNOSIS — Z1151 Encounter for screening for human papillomavirus (HPV): Secondary | ICD-10-CM | POA: Insufficient documentation

## 2013-06-30 DIAGNOSIS — F419 Anxiety disorder, unspecified: Secondary | ICD-10-CM

## 2013-06-30 DIAGNOSIS — G35 Multiple sclerosis: Secondary | ICD-10-CM

## 2013-06-30 MED ORDER — LEVOTHYROXINE SODIUM 112 MCG PO TABS: ORAL_TABLET | ORAL | Status: DC

## 2013-06-30 MED ORDER — LOSARTAN POTASSIUM-HCTZ 100-12.5 MG PO TABS: 1.0000 | ORAL_TABLET | Freq: Every day | ORAL | Status: DC

## 2013-06-30 MED ORDER — FLUOXETINE HCL 40 MG PO CAPS: 40.0000 mg | ORAL_CAPSULE | Freq: Every day | ORAL | Status: DC

## 2013-06-30 MED ORDER — ATORVASTATIN CALCIUM 20 MG PO TABS: 20.0000 mg | ORAL_TABLET | Freq: Every day | ORAL | Status: DC

## 2013-06-30 MED ORDER — TRAMADOL HCL 50 MG PO TABS: 50.0000 mg | ORAL_TABLET | Freq: Four times a day (QID) | ORAL | Status: DC | PRN

## 2013-06-30 NOTE — Assessment & Plan Note (Signed)
Check labs con't meds 

## 2013-06-30 NOTE — Patient Instructions (Signed)

## 2013-06-30 NOTE — Progress Notes (Signed)
Subjective:     Cassandra Rios is a 63 y.o. female and is here for a comprehensive physical exam. The patient reports no problems.  History   Social History  . Marital Status: Married    Spouse Name: N/A    Number of Children: 2  . Years of Education: N/A   Occupational History  . medical Advertising copywriter  . Therapist, music   Social History Main Topics  . Smoking status: Former Smoker -- 2.00 packs/day for 35 years    Types: Cigarettes    Quit date: 04/05/2007  . Smokeless tobacco: Not on file  . Alcohol Use: No     Comment: very rare  . Drug Use: No  . Sexual Activity: Yes    Partners: Male   Other Topics Concern  . Not on file   Social History Narrative  . No narrative on file   Health Maintenance  Topic Date Due  . Colonoscopy  06/07/2013  . Influenza Vaccine  01/03/2014  . Mammogram  01/14/2015  . Pap Smear  06/30/2016  . Tetanus/tdap  03/06/2021  . Zostavax  Completed    The following portions of the patient's history were reviewed and updated as appropriate:  She  has a past medical history of MS (multiple sclerosis); Obesity; Allergic rhinitis; Stomach ulcer from aspirin/ibuprofen-like drugs (NSAID's); Colon polyps; COPD (chronic obstructive pulmonary disease); Hypothyroidism; Chronic hoarseness; Hypertension; and Dyslipidemia. She  does not have any pertinent problems on file. She  has past surgical history that includes Cholecystectomy; Carpal tunnel release; Bariatric Surgery (09/07/08); Hiatal hernia repair (09/07/08); Abdominal hysterectomy; and Cholecystectomy. Her family history includes Arthritis in her sister; COPD in her father and sister; Heart disease in her father and mother; Stroke (age of onset: 57) in her mother. She  reports that she quit smoking about 6 years ago. Her smoking use included Cigarettes. She has a 70 pack-year smoking history. She does not have any smokeless tobacco history on file. She reports that she does not drink  alcohol or use illicit drugs. She has a current medication list which includes the following prescription(s): aspirin, atorvastatin, calcium carb-cholecalciferol, fluoxetine, fluticasone, interferon beta-1b, levothyroxine, losartan-hydrochlorothiazide, multivitamin, and tramadol. Current Outpatient Prescriptions on File Prior to Visit  Medication Sig Dispense Refill  . aspirin 81 MG tablet Take 81 mg by mouth daily.      Marland Kitchen atorvastatin (LIPITOR) 20 MG tablet Take 20 mg by mouth daily.      . Calcium Carbonate-Vitamin D (CALCIUM 600 + D PO) Take by mouth daily.        Marland Kitchen FLUoxetine (PROZAC) 40 MG capsule Take 1 capsule (40 mg total) by mouth daily.  90 capsule  3  . fluticasone (FLONASE) 50 MCG/ACT nasal spray Place 2 sprays into the nose daily.  16 g  11  . Interferon Beta-1b (BETASERON) 0.3 MG KIT injection Inject 0.25 mg into the skin every other day.  1 kit  6  . levothyroxine (SYNTHROID, LEVOTHROID) 112 MCG tablet 1 po qd  90 tablet  3  . losartan-hydrochlorothiazide (HYZAAR) 100-12.5 MG per tablet Take 1 tablet by mouth daily.  30 tablet  5  . Multiple Vitamin (MULTIVITAMIN) tablet Take 1 tablet by mouth 2 (two) times daily.        . traMADol (ULTRAM) 50 MG tablet Take 50 mg by mouth every 6 (six) hours as needed.       No current facility-administered medications on file prior to visit.   She  is allergic to codeine; hydromorphone hcl; ibuprofen; and morphine sulfate..  Review of Systems Review of Systems  Constitutional: Negative for activity change, appetite change and fatigue.  HENT: Negative for hearing loss, congestion, tinnitus and ear discharge.  dentist q59mEyes: Negative for visual disturbance (see optho q1y -- vision corrected to 20/20 with glasses).  Respiratory: Negative for cough, chest tightness and shortness of breath.   Cardiovascular: Negative for chest pain, palpitations and leg swelling.  Gastrointestinal: Negative for abdominal pain, diarrhea, constipation and  abdominal distention.  Genitourinary: Negative for urgency, frequency, decreased urine volume and difficulty urinating.  Musculoskeletal: Negative for back pain, arthralgias and gait problem.  Skin: Negative for color change, pallor and rash.  Neurological: Negative for dizziness, light-headedness, numbness and headaches.  Hematological: Negative for adenopathy. Does not bruise/bleed easily.  Psychiatric/Behavioral: Negative for suicidal ideas, confusion, sleep disturbance, self-injury, dysphoric mood, decreased concentration and agitation.       Objective:    BP 130/62  Pulse 62  Temp(Src) 98.2 F (36.8 C) (Oral)  Ht 5' 6"  (1.676 m)  Wt 247 lb 12.8 oz (112.401 kg)  BMI 40.01 kg/m2  SpO2 98% General appearance: alert, cooperative, appears stated age and no distress Head: Normocephalic, without obvious abnormality, atraumatic Eyes: conjunctivae/corneas clear. PERRL, EOM's intact. Fundi benign. Ears: normal TM's and external ear canals both ears Nose: Nares normal. Septum midline. Mucosa normal. No drainage or sinus tenderness. Throat: lips, mucosa, and tongue normal; teeth and gums normal Neck: no adenopathy, no carotid bruit, no JVD, supple, symmetrical, trachea midline and thyroid not enlarged, symmetric, no tenderness/mass/nodules Back: symmetric, no curvature. ROM normal. No CVA tenderness. Lungs: clear to auscultation bilaterally Breasts: normal appearance, no masses or tenderness Heart: regular rate and rhythm, S1, S2 normal, no murmur, click, rub or gallop Abdomen: soft, non-tender; bowel sounds normal; no masses,  no organomegaly Pelvic: external genitalia normal, no adnexal masses or tenderness and uterus surgically absent, vaginal smear done Extremities: extremities normal, atraumatic, no cyanosis or edema Pulses: 2+ and symmetric Skin: Skin color, texture, turgor normal. No rashes or lesions Lymph nodes: Cervical, supraclavicular, and axillary nodes  normal. Neurologic: Alert and oriented X 3, normal strength and tone. Normal symmetric reflexes. Normal coordination and gait Psych-- no anxiety, no depression      Assessment:    Healthy female exam.      Plan:    ghm utd Check labs See After Visit Summary for Counseling Recommendations

## 2013-06-30 NOTE — Progress Notes (Signed)
Pre visit review using our clinic review tool, if applicable. No additional management support is needed unless otherwise documented below in the visit note. 

## 2013-06-30 NOTE — Assessment & Plan Note (Signed)
Per neurology 

## 2013-06-30 NOTE — Assessment & Plan Note (Signed)
Check labs 

## 2013-07-08 ENCOUNTER — Other Ambulatory Visit (INDEPENDENT_AMBULATORY_CARE_PROVIDER_SITE_OTHER): Payer: Managed Care, Other (non HMO)

## 2013-07-08 DIAGNOSIS — N390 Urinary tract infection, site not specified: Secondary | ICD-10-CM

## 2013-07-08 DIAGNOSIS — Z0000 Encounter for general adult medical examination without abnormal findings: Secondary | ICD-10-CM

## 2013-07-08 LAB — BASIC METABOLIC PANEL
BUN: 13 mg/dL (ref 6–23)
CHLORIDE: 103 meq/L (ref 96–112)
CO2: 27 mEq/L (ref 19–32)
Calcium: 9.3 mg/dL (ref 8.4–10.5)
Creatinine, Ser: 0.9 mg/dL (ref 0.4–1.2)
GFR: 67.99 mL/min (ref >60.00)
Glucose, Bld: 88 mg/dL (ref 70–99)
POTASSIUM: 4 meq/L (ref 3.5–5.1)
SODIUM: 138 meq/L (ref 135–145)

## 2013-07-08 LAB — LIPID PANEL
CHOL/HDL RATIO: 3
CHOLESTEROL: 156 mg/dL (ref 0–200)
HDL: 46.8 mg/dL (ref >39.00)
LDL CALC: 88 mg/dL (ref 0–99)
Triglycerides: 104 mg/dL (ref 0.0–149.0)
VLDL: 20.8 mg/dL (ref 0.0–40.0)

## 2013-07-08 LAB — CBC WITH DIFFERENTIAL/PLATELET
BASOS PCT: 0.5 % (ref 0.0–3.0)
Basophils Absolute: 0 10*3/uL (ref 0.0–0.1)
EOS PCT: 2.2 % (ref 0.0–5.0)
Eosinophils Absolute: 0.1 10*3/uL (ref 0.0–0.7)
HEMATOCRIT: 43.7 % (ref 36.0–46.0)
HEMOGLOBIN: 14 g/dL (ref 12.0–15.0)
LYMPHS ABS: 2.1 10*3/uL (ref 0.7–4.0)
Lymphocytes Relative: 32.5 % (ref 12.0–46.0)
MCHC: 32 g/dL (ref 30.0–36.0)
MCV: 92.5 fl (ref 78.0–100.0)
MONO ABS: 0.7 10*3/uL (ref 0.1–1.0)
MONOS PCT: 11.1 % (ref 3.0–12.0)
Neutro Abs: 3.4 10*3/uL (ref 1.4–7.7)
Neutrophils Relative %: 53.7 % (ref 43.0–77.0)
PLATELETS: 100 10*3/uL — AB (ref 150.0–400.0)
RBC: 4.72 Mil/uL (ref 3.87–5.11)
RDW: 13.1 % (ref 11.5–14.6)
WBC: 6.4 10*3/uL (ref 4.5–10.5)

## 2013-07-08 LAB — POCT URINALYSIS DIPSTICK
BILIRUBIN UA: NEGATIVE
Blood, UA: NEGATIVE
GLUCOSE UA: NEGATIVE
Ketones, UA: NEGATIVE
NITRITE UA: NEGATIVE
Protein, UA: NEGATIVE
Spec Grav, UA: 1.015
UROBILINOGEN UA: 0.2
pH, UA: 6.5

## 2013-07-08 LAB — HEPATIC FUNCTION PANEL
ALT: 24 U/L (ref 0–35)
AST: 24 U/L (ref 0–37)
Albumin: 3.7 g/dL (ref 3.5–5.2)
Alkaline Phosphatase: 76 U/L (ref 39–117)
Bilirubin, Direct: 0 mg/dL (ref 0.0–0.3)
TOTAL PROTEIN: 7.2 g/dL (ref 6.0–8.3)
Total Bilirubin: 0.6 mg/dL (ref 0.3–1.2)

## 2013-07-08 LAB — TSH: TSH: 1.06 u[IU]/mL (ref 0.35–5.50)

## 2013-07-09 ENCOUNTER — Telehealth: Payer: Self-pay | Admitting: Family Medicine

## 2013-07-09 NOTE — Telephone Encounter (Signed)
Relevant patient education mailed to patient.  

## 2013-07-10 LAB — URINE CULTURE
COLONY COUNT: NO GROWTH
Organism ID, Bacteria: NO GROWTH

## 2013-07-16 ENCOUNTER — Other Ambulatory Visit: Payer: Self-pay

## 2013-07-16 MED ORDER — INTERFERON BETA-1B 0.3 MG ~~LOC~~ KIT: 0.2500 mg | PACK | SUBCUTANEOUS | Status: DC

## 2013-07-19 ENCOUNTER — Other Ambulatory Visit: Payer: Self-pay | Admitting: Family Medicine

## 2013-10-29 ENCOUNTER — Encounter (INDEPENDENT_AMBULATORY_CARE_PROVIDER_SITE_OTHER): Payer: Self-pay

## 2013-10-29 ENCOUNTER — Encounter: Payer: Self-pay | Admitting: Adult Health

## 2013-10-29 ENCOUNTER — Ambulatory Visit (INDEPENDENT_AMBULATORY_CARE_PROVIDER_SITE_OTHER): Payer: Medicare HMO | Admitting: Adult Health

## 2013-10-29 VITALS — BP 152/76 | HR 57 | Temp 97.3°F | Ht 66.0 in | Wt 245.0 lb

## 2013-10-29 DIAGNOSIS — G35 Multiple sclerosis: Secondary | ICD-10-CM

## 2013-10-29 NOTE — Patient Instructions (Signed)
Multiple Sclerosis  Multiple sclerosis (MS) is a disease of the central nervous system. It leads to loss of the insulating covering of the nerves (myelin sheath) of your brain. When this happens, brain signals do not get transmitted properly or may not get transmitted at all. The symptoms of MS occur in episodes or attacks. These attacks may last weeks to months. There may be long periods of nearly no problems between attacks. The age of onset of MS varies.   CAUSES  The cause of MS is unknown. However, it is more common in the [place] than in the southern [place].  RISK FACTORS  There is a higher incidence of MS in women than in men. MS is not an inherited illness, although your risk of MS is higher if you have a relative with MS.  SIGNS AND SYMPTOMS   The symptoms of MS occur in episodes or attacks. These attacks may last weeks to months. There may be long periods of almost no symptoms between attacks.  The symptoms of MS vary. This is because of the many different ways it affects the central nervous system. The main symptoms of MS include:   Vision problems and eye pain.   Numbness.   Weakness.   Paralysis in your arms, hands, feet, and legs (extremities).   Balance problems.   Tremors.  DIAGNOSIS   Your health care provider can diagnose MS with the help of imaging exams and lab tests. These may include specialized X-ray exams and spinal fluid tests. The best imaging exam to confirm a diagnosis of MS is MRI.  TREATMENT   There is no known cure for MS, but there are medicines that can decrease the number and frequency of attacks. Steroids are often used for short-term relief. Physical and occupational therapy may also help.  HOME CARE INSTRUCTIONS    Take medicines as directed by your health care provider.   Exercise as directed by your health care provider.  SEEK MEDICAL CARE IF:  You begin to feel depressed.  SEEK IMMEDIATE MEDICAL CARE IF:   You develop paralysis.   You develop  problems with bladder, bowel, or sexual function.   You develop mental changes, such as forgetfulness or mood swings.   You have a seizure.  Document Released: 05/19/2000 Document Revised: 03/12/2013 Document Reviewed: 01/27/2013  Exit[employer] Patient Information 2014 [employer].

## 2013-10-29 NOTE — Progress Notes (Signed)
PATIENT: Cassandra Rios DOB: 23-Aug-1950  REASON FOR VISIT: follow up HISTORY FROM: patient  HISTORY OF PRESENT ILLNESS: Ms. Cassandra Rios is a 63 year old right-handed white female with a history of obesity and multiple sclerosis. She returns today for follow-up. The patient is on Betaseron and is tolerating it well. She denies having any numbness but does say she has generalized weakness and fatigue. States that she feels more burning in her muscles- usually in her back. Denies having any balance issues, states that if she moves quickly then she may get off balance. Patient states she did fall on the ice back in February. Patient had x-rays (lumbar and both hips) which were unremarkable. She continues to have pain in the lumbar region. Her orthopedic physician ordered an MRI and she is waiting to get that done. She takes hydrocodone and celebrex for the pain. Does not use an assistive device when ambulating. Denies any visual or cognitive changes. She follows up with an eye MD yearly. Denies trouble with bowels or bladder. No new medical issues since the last visit.   REVIEW OF SYSTEMS: Full 14 system review of systems performed and notable only for:  Constitutional: N/A  Eyes: eye discharge, light sensitivity, eye pain  Ear/Nose/Throat: N/A  Skin: N/A  Cardiovascular: chest pain  Respiratory: cough  Gastrointestinal: N/A  Genitourinary: N/A Hematology/Lymphatic: N/A  Endocrine: N/A Musculoskeletal: joint pain, back pain, aching muscles Allergy/Immunology: env allergies Neurological: headache, weakness Psychiatric: N/A Sleep: frequent waking    ALLERGIES: Allergies  Allergen Reactions  . Codeine   . Hydromorphone Hcl     REACTION: nausea/vomiting  . Ibuprofen     REACTION: stomach ulcers  . Morphine Sulfate     HOME MEDICATIONS: Outpatient Prescriptions Prior to Visit  Medication Sig Dispense Refill  . aspirin 81 MG tablet Take 81 mg by mouth daily.      Marland Kitchen  atorvastatin (LIPITOR) 20 MG tablet TAKE 1 TABLET (20 MG TOTAL) BY MOUTH DAILY.  90 tablet  1  . Calcium Carbonate-Vitamin D (CALCIUM 600 + D PO) Take by mouth daily.        Marland Kitchen FLUoxetine (PROZAC) 40 MG capsule Take 1 capsule (40 mg total) by mouth daily.  90 capsule  3  . Interferon Beta-1b (BETASERON) 0.3 MG KIT injection Inject 0.25 mg into the skin every other day.  1 kit  6  . levothyroxine (SYNTHROID, LEVOTHROID) 112 MCG tablet 1 po qd  90 tablet  3  . losartan-hydrochlorothiazide (HYZAAR) 100-12.5 MG per tablet Take 1 tablet by mouth daily.  90 tablet  3  . Multiple Vitamin (MULTIVITAMIN) tablet Take 1 tablet by mouth 2 (two) times daily.        . traMADol (ULTRAM) 50 MG tablet Take 1 tablet (50 mg total) by mouth every 6 (six) hours as needed.  30 tablet  2  . fluticasone (FLONASE) 50 MCG/ACT nasal spray Place 2 sprays into the nose daily.  16 g  11   No facility-administered medications prior to visit.    PAST MEDICAL HISTORY: Past Medical History  Diagnosis Date  . MS (multiple sclerosis)   . Obesity   . Allergic rhinitis   . Stomach ulcer from aspirin/ibuprofen-like drugs (NSAID's)   . Colon polyps   . COPD (chronic obstructive pulmonary disease)     Dr. Marchelle Gearing  . Hypothyroidism   . Chronic hoarseness   . Hypertension   . Dyslipidemia     PAST SURGICAL HISTORY: Past Surgical  History  Procedure Laterality Date  . Cholecystectomy    . Carpal tunnel release    . Bariatric surgery  09/07/08    lap band  . Hiatal hernia repair  09/07/08  . Abdominal hysterectomy    . Cholecystectomy      FAMILY HISTORY: Family History  Problem Relation Age of Onset  . COPD Father   . Heart disease Father   . COPD Sister   . Arthritis Sister   . Stroke Mother 62  . Heart disease Mother     congenital heart defect?    SOCIAL HISTORY: History   Social History  . Marital Status: Married    Spouse Name: Casimiro Needle    Number of Children: 2  . Years of Education: 16    Occupational History  . medical Garment/textile technologist  . Research scientist (physical sciences)   Social History Main Topics  . Smoking status: Former Smoker -- 2.00 packs/day for 35 years    Types: Cigarettes    Quit date: 04/05/2007  . Smokeless tobacco: Never Used  . Alcohol Use: No     Comment: very rare  . Drug Use: No  . Sexual Activity: Yes    Partners: Male   Other Topics Concern  . Not on file   Social History Narrative   Patient is right handed,reside in home with husband      PHYSICAL EXAM  Filed Vitals:   10/29/13 0933  BP: 152/76  Pulse: 57  Temp: 97.3 F (36.3 C)  TempSrc: Oral  Height: 5\' 6"  (1.676 m)  Weight: 245 lb (111.131 kg)   Body mass index is 39.56 kg/(m^2).  Generalized: Well developed, in no acute distress  Neck: Supple, no carotid bruits  Cardiac: Regular rate rhythm, no murmur   Neurological examination  Mentation: Alert oriented to time, place, history taking. Follows all commands speech and language fluent Cranial nerve II-XII: Pupils were equal round reactive to light. Extraocular movements were full, visual field were full on confrontational test. Motor: The motor testing reveals 5 over 5 strength of all 4 extremities. Good symmetric motor tone is noted throughout.  Sensory: Sensory testing is intact to soft touch on all 4 extremities. No evidence of extinction is noted.  Coordination: Cerebellar testing reveals good finger-nose-finger and heel-to-shin bilaterally.  Gait and station: Gait is normal. Tandem gait is normal. Romberg is negative. No drift is seen.  Reflexes: Deep tendon reflexes are symmetric and normal bilaterally.     DIAGNOSTIC DATA (LABS, IMAGING, TESTING) - I reviewed patient records, labs, notes, testing and imaging myself where available.  MRI Brain 05/24/12: This MRI scan of the brain shows multiple subcortical and periventricular white matter hyperintensities typical for multiple sclerosis. There is mild atrophy of corpus  callosum, cortex and a few T1 black holes which indicate chronic disease. Overall mild progression of white matter changes as compared with previous MRI dated 01/04/2009.   Lab Results  Component Value Date   WBC 6.4 07/08/2013   HGB 14.0 07/08/2013   HCT 43.7 07/08/2013   MCV 92.5 07/08/2013   PLT 100.0* 07/08/2013      Component Value Date/Time   NA 138 07/08/2013 0806   K 4.0 07/08/2013 0806   CL 103 07/08/2013 0806   CO2 27 07/08/2013 0806   GLUCOSE 88 07/08/2013 0806   BUN 13 07/08/2013 0806   CREATININE 0.9 07/08/2013 0806   CALCIUM 9.3 07/08/2013 0806   PROT 7.2 07/08/2013 0806   ALBUMIN 3.7 07/08/2013 0806  AST 24 07/08/2013 0806   ALT 24 07/08/2013 0806   ALKPHOS 76 07/08/2013 0806   BILITOT 0.6 07/08/2013 0806   GFRNONAA 77.78 01/17/2010 0844   GFRAA  Value: >60        The eGFR has been calculated using the MDRD equation. This calculation has not been validated in all clinical situations. eGFR's persistently <60 mL/min signify possible Chronic Kidney Disease. 09/03/2008 1355   Lab Results  Component Value Date   CHOL 156 07/08/2013   HDL 46.80 07/08/2013   LDLCALC 88 07/08/2013   TRIG 104.0 07/08/2013   CHOLHDL 3 07/08/2013   Lab Results  Component Value Date   HGBA1C 5.7 01/25/2012   Lab Results  Component Value Date   VITAMINB12 648 03/16/2010   Lab Results  Component Value Date   TSH 1.06 07/08/2013      ASSESSMENT AND PLAN 63 y.o. year old female  has a past medical history of MS (multiple sclerosis); Obesity; Allergic rhinitis; Stomach ulcer from aspirin/ibuprofen-like drugs (NSAID's); Colon polyps; COPD (chronic obstructive pulmonary disease); Hypothyroidism; Chronic hoarseness; Hypertension; and Dyslipidemia. here with :  1. MULTIPLE SCLEROSIS, RELAPSING/REMITTING 2. Morbid obesity  Patient has remained stable on Betaseron. She has not had any known exacerbations of her MS. She does feel that over the years she has had very slow progression of increased fatigue and generalized weakness but denies  any acute changes. Exam today was unremarkable. Patient is currently awaiting MRI of lumbar spine following a fall in February. Her last MRI of the brain was in December 2013. In 6 months we will recheck MRI of the Brain to look for progression. Patient preferred that we wait until her next visit due to insurance reasons. Patient has lost 13 lbs since the last visit. Encouraged patient to follow-up with PCP in regards to blood pressure management. Patient will follow-up in 5-6 months or sooner if needed.   Butch Penny, MSN, NP-C 10/29/2013, 9:44 AM [facility] [address], [address] [place], [place] [address] [phone]  Note: This document was prepared with digital dictation and possible smart phrase technology. Any transcriptional errors that result from this process are unintentional.

## 2013-10-29 NOTE — Progress Notes (Signed)
I have read the note, and I agree with the clinical assessment and plan.  Dawnn Nam K Zyeir Dymek   

## 2013-12-08 ENCOUNTER — Ambulatory Visit: Payer: Managed Care, Other (non HMO) | Attending: Physical Medicine and Rehabilitation

## 2013-12-08 DIAGNOSIS — M545 Low back pain, unspecified: Secondary | ICD-10-CM | POA: Insufficient documentation

## 2013-12-08 DIAGNOSIS — R262 Difficulty in walking, not elsewhere classified: Secondary | ICD-10-CM | POA: Insufficient documentation

## 2013-12-08 DIAGNOSIS — M2550 Pain in unspecified joint: Secondary | ICD-10-CM | POA: Diagnosis not present

## 2013-12-08 DIAGNOSIS — R293 Abnormal posture: Secondary | ICD-10-CM | POA: Insufficient documentation

## 2013-12-17 ENCOUNTER — Encounter (HOSPITAL_COMMUNITY): Payer: Self-pay | Admitting: Emergency Medicine

## 2013-12-17 ENCOUNTER — Emergency Department (INDEPENDENT_AMBULATORY_CARE_PROVIDER_SITE_OTHER)
Admission: EM | Admit: 2013-12-17 | Discharge: 2013-12-17 | Disposition: A | Discharge status: Home / Self Care | Payer: Managed Care, Other (non HMO) | Source: Home / Self Care | Attending: Family Medicine | Admitting: Family Medicine

## 2013-12-17 ENCOUNTER — Emergency Department (INDEPENDENT_AMBULATORY_CARE_PROVIDER_SITE_OTHER): Payer: Managed Care, Other (non HMO)

## 2013-12-17 DIAGNOSIS — S2020XA Contusion of thorax, unspecified, initial encounter: Secondary | ICD-10-CM

## 2013-12-17 DIAGNOSIS — R296 Repeated falls: Secondary | ICD-10-CM

## 2013-12-17 NOTE — Discharge Instructions (Signed)
Ice , medicines, activity as tolerated. See your doctor if any problems.

## 2013-12-17 NOTE — ED Notes (Signed)
Reports falling out of the shower on Monday.  C/o pain in left side of ribs.  Pain with coughing. Movement. Etc.   Pain right side of arm with bruising

## 2013-12-17 NOTE — ED Provider Notes (Signed)
CSN: [id number]     Arrival date & time 12/17/13  1537 History   First MD Initiated Contact with Patient 12/17/13 1609     Chief Complaint  Patient presents with  . Fall   (Consider location/radiation/quality/duration/timing/severity/associated sxs/prior Treatment) Patient is a 63 y.o. female presenting with fall. The history is provided by the patient.  Fall This is a new problem. The current episode started 2 days ago (fell in shower striking left ribs, no head injury, still sore.). The problem has not changed since onset.Associated symptoms include chest pain. Pertinent negatives include no shortness of breath.    Past Medical History  Diagnosis Date  . MS (multiple sclerosis)   . Obesity   . Allergic rhinitis   . Stomach ulcer from aspirin/ibuprofen-like drugs (NSAID's)   . Colon polyps   . COPD (chronic obstructive pulmonary disease)     Dr. Chase Caller  . Hypothyroidism   . Chronic hoarseness   . Hypertension   . Dyslipidemia    Past Surgical History  Procedure Laterality Date  . Cholecystectomy    . Carpal tunnel release    . Bariatric surgery  09/07/08    lap band  . Hiatal hernia repair  09/07/08  . Abdominal hysterectomy    . Cholecystectomy     Family History  Problem Relation Age of Onset  . COPD Father   . Heart disease Father   . COPD Sister   . Arthritis Sister   . Stroke Mother 4  . Heart disease Mother     congenital heart defect?   History  Substance Use Topics  . Smoking status: Former Smoker -- 2.00 packs/day for 35 years    Types: Cigarettes    Quit date: 04/05/2007  . Smokeless tobacco: Never Used  . Alcohol Use: No     Comment: very rare   OB History   Grav Para Term Preterm Abortions TAB SAB Ect Mult Living                 Review of Systems  Constitutional: Negative.   Respiratory: Negative for shortness of breath.   Cardiovascular: Positive for chest pain. Negative for palpitations and leg swelling.  Musculoskeletal: Negative for  gait problem.  Skin: Positive for wound.       Ecchymosis to left chest , right forearm.    Allergies  Codeine; Hydromorphone hcl; Ibuprofen; and Morphine sulfate  Home Medications   Prior to Admission medications   Medication Sig Start Date End Date Taking? Authorizing Provider  aspirin 81 MG tablet Take 81 mg by mouth daily.   Yes Historical Provider, MD  atorvastatin (LIPITOR) 20 MG tablet TAKE 1 TABLET (20 MG TOTAL) BY MOUTH DAILY.   Yes Rosalita Chessman, DO  Calcium Carbonate-Vitamin D (CALCIUM 600 + D PO) Take by mouth daily.     Yes Historical Provider, MD  celecoxib (CELEBREX) 200 MG capsule Take 200 mg by mouth daily as needed.   Yes Historical Provider, MD  FLUoxetine (PROZAC) 40 MG capsule Take 1 capsule (40 mg total) by mouth daily. 06/30/13 07/17/14 Yes Rosalita Chessman, DO  HYDROcodone-acetaminophen (NORCO/VICODIN) 5-325 MG per tablet Take 1 tablet by mouth every 6 (six) hours as needed for moderate pain.   Yes Historical Provider, MD  Interferon Beta-1b (BETASERON) 0.3 MG KIT injection Inject 0.25 mg into the skin every other day. 07/16/13  Yes Kathrynn Ducking, MD  levothyroxine (SYNTHROID, LEVOTHROID) 112 MCG tablet 1 po qd 06/30/13  Yes Alferd Apa  Lowne, DO  losartan-hydrochlorothiazide (HYZAAR) 100-12.5 MG per tablet Take 1 tablet by mouth daily. 06/30/13  Yes Rosalita Chessman, DO  Multiple Vitamin (MULTIVITAMIN) tablet Take 1 tablet by mouth 2 (two) times daily.     Yes Historical Provider, MD  traMADol (ULTRAM) 50 MG tablet Take 1 tablet (50 mg total) by mouth every 6 (six) hours as needed. 06/30/13  Yes Yvonne R Lowne, DO  fluticasone (FLONASE) 50 MCG/ACT nasal spray Place 2 sprays into the nose daily. 06/13/12 06/30/13  Alferd Apa Lowne, DO   BP 152/70  Pulse 56  Temp(Src) 98.4 F (36.9 C) (Oral)  Resp 18  SpO2 100% Physical Exam  Nursing note and vitals reviewed. Constitutional: She is oriented to person, place, and time. She appears well-developed and well-nourished. No  distress.  HENT:  Head: Normocephalic and atraumatic.  Eyes: Pupils are equal, round, and reactive to light.  Neck: Normal range of motion. Neck supple.  Cardiovascular: Regular rhythm, normal heart sounds and intact distal pulses.   Pulmonary/Chest: Effort normal and breath sounds normal. She exhibits tenderness.  Ecchymosis to left post lat ribs,tender , lungs clear.  Abdominal: Soft. Bowel sounds are normal. She exhibits no distension and no mass. There is no tenderness. There is no rebound and no guarding.  Musculoskeletal: She exhibits tenderness.  ecchy to right forearm, no pain  , full rom, nvt intact.  Neurological: She is alert and oriented to person, place, and time.  Skin: Skin is warm and dry.    ED Course  Procedures (including critical care time) Labs Review Labs Reviewed - No data to display  Imaging Review Dg Ribs Unilateral W/chest Left  12/17/2013   CLINICAL DATA:  Difficulty breathing and left-sided chest pain especially with coughing status post trauma; history of COPD and previous tobacco use  EXAM: LEFT RIBS AND CHEST - 3+ VIEW  COMPARISON:  PA and lateral chest x-ray of February 26, 2008.  FINDINGS: FINDINGS The lungs are borderline hypoinflated. There is no focal infiltrate. There is no pleural effusion or pneumothorax. The heart is top-normal in size but stable. The pulmonary vascularity is normal. Left rib detail films reveal no acute fracture. 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 A tubular structure projects over the left upper quadrant of the abdomen.  IMPRESSION: No acute rib fracture is demonstrated. There is borderline hypoinflation without acute cardiopulmonary abnormality.   Electronically Signed   By: David  Martinique   On: 12/17/2013 16:49     MDM   1. Multiple contusions of trunk, initial encounter        Billy Fischer, MD 12/17/13 1715

## 2013-12-22 ENCOUNTER — Ambulatory Visit: Payer: Managed Care, Other (non HMO)

## 2013-12-22 DIAGNOSIS — R262 Difficulty in walking, not elsewhere classified: Secondary | ICD-10-CM | POA: Diagnosis not present

## 2013-12-24 ENCOUNTER — Ambulatory Visit: Payer: Managed Care, Other (non HMO)

## 2013-12-24 DIAGNOSIS — R262 Difficulty in walking, not elsewhere classified: Secondary | ICD-10-CM | POA: Diagnosis not present

## 2013-12-29 ENCOUNTER — Ambulatory Visit: Payer: Managed Care, Other (non HMO) | Admitting: Rehabilitation

## 2013-12-29 DIAGNOSIS — R262 Difficulty in walking, not elsewhere classified: Secondary | ICD-10-CM | POA: Diagnosis not present

## 2013-12-31 ENCOUNTER — Ambulatory Visit: Payer: Managed Care, Other (non HMO) | Admitting: Rehabilitation

## 2013-12-31 DIAGNOSIS — R262 Difficulty in walking, not elsewhere classified: Secondary | ICD-10-CM | POA: Diagnosis not present

## 2014-02-16 ENCOUNTER — Ambulatory Visit (INDEPENDENT_AMBULATORY_CARE_PROVIDER_SITE_OTHER): Payer: Managed Care, Other (non HMO) | Admitting: Family Medicine

## 2014-02-16 ENCOUNTER — Encounter: Payer: Self-pay | Admitting: Family Medicine

## 2014-02-16 VITALS — BP 121/75 | HR 70 | Temp 98.0°F | Wt 248.9 lb

## 2014-02-16 DIAGNOSIS — J018 Other acute sinusitis: Secondary | ICD-10-CM

## 2014-02-16 DIAGNOSIS — Z8249 Family history of ischemic heart disease and other diseases of the circulatory system: Secondary | ICD-10-CM

## 2014-02-16 DIAGNOSIS — L03319 Cellulitis of trunk, unspecified: Secondary | ICD-10-CM

## 2014-02-16 DIAGNOSIS — L02219 Cutaneous abscess of trunk, unspecified: Secondary | ICD-10-CM

## 2014-02-16 MED ORDER — FLUTICASONE PROPIONATE 50 MCG/ACT NA SUSP: 2.0000 | Freq: Every day | NASAL | Status: DC

## 2014-02-16 MED ORDER — AMOXICILLIN-POT CLAVULANATE 875-125 MG PO TABS: 1.0000 | ORAL_TABLET | Freq: Two times a day (BID) | ORAL | Status: DC

## 2014-02-16 NOTE — Progress Notes (Signed)
Pre visit review using our clinic review tool, if applicable. No additional management support is needed unless otherwise documented below in the visit note. 

## 2014-02-16 NOTE — Patient Instructions (Signed)

## 2014-02-16 NOTE — Progress Notes (Signed)
Subjective:    Patient ID: Cassandra Rios, female    DOB: 08-25-1950, 63 y.o.   MRN: [id number]  HPI Pt here c/o abscess on ant chest that is draining but is tender --x several days.  She is also c/o sinus congestion and is requesting a test because her sister had an abd aneurysm.  No other complaints.    Review of Systems As above    Objective:   Physical Exam  BP 121/75  Pulse 70  Temp(Src) 98 F (36.7 C) (Oral)  Wt 248 lb 14.4 oz (112.9 kg)  SpO2 98% General appearance: alert, cooperative, appears stated age and no distress Ears: normal TM's and external ear canals both ears Nose: clear discharge, mild congestion, turbinates red, swollen, sinus tenderness bilateral Throat: lips, mucosa, and tongue normal; teeth and gums normal Neck: no adenopathy, supple, symmetrical, trachea midline and thyroid not enlarged, symmetric, no tenderness/mass/nodules Lungs: clear to auscultation bilaterally Heart: S1, S2 normal Skin: abscess ant chest-- draining,  + errythema, culture done            abx ointment and bandaid put in place       Assessment & Plan:  1. Family history of abdominal aortic aneurysm   - US Abdomen Complete; Future  2. Other acute sinusitis   - fluticasone (FLONASE) 50 MCG/ACT nasal spray; Place 2 sprays into both nostrils daily.  Dispense: 16 g; Refill: 11 - amoxicillin-clavulanate (AUGMENTIN) 875-125 MG per tablet; Take 1 tablet by mouth 2 (two) times daily.  Dispense: 20 tablet; Refill: 0  3. Cellulitis and abscess of trunk augmentin  Culture done

## 2014-02-18 ENCOUNTER — Ambulatory Visit (HOSPITAL_BASED_OUTPATIENT_CLINIC_OR_DEPARTMENT_OTHER): Payer: Managed Care, Other (non HMO)

## 2014-02-19 LAB — WOUND CULTURE
GRAM STAIN: NONE SEEN
GRAM STAIN: NONE SEEN

## 2014-02-24 ENCOUNTER — Other Ambulatory Visit: Payer: Self-pay | Admitting: *Deleted

## 2014-02-24 DIAGNOSIS — G35 Multiple sclerosis: Secondary | ICD-10-CM

## 2014-02-24 MED ORDER — INTERFERON BETA-1B 0.3 MG ~~LOC~~ KIT: 0.2500 mg | PACK | SUBCUTANEOUS | Status: DC

## 2014-03-02 ENCOUNTER — Telehealth: Payer: Self-pay | Admitting: Family Medicine

## 2014-03-02 MED ORDER — LEVOFLOXACIN 500 MG PO TABS: 500.0000 mg | ORAL_TABLET | Freq: Every day | ORAL | Status: DC

## 2014-03-02 NOTE — Telephone Encounter (Signed)
levaquin 500 mg 1 po qd x 10 days---- ov if no better after that

## 2014-03-02 NOTE — Telephone Encounter (Signed)
Detailed message left advising Rx faxed and results WNL and requested a call back if needed.       KP

## 2014-03-02 NOTE — Telephone Encounter (Signed)
Please advise wound culture was negative.      KP

## 2014-03-02 NOTE — Telephone Encounter (Signed)
Pt states she never received her results from her culture that was done 2 wks ago. Pt states she has finished her antibiotics and the cyst is still there and she still getting fluid out of it. Pt is [place] the beach and wanted to know if she should get another rx for more antibiotics or what should she do.  States if she does want to call something in at [facility][phone].

## 2014-03-05 ENCOUNTER — Telehealth: Payer: Self-pay

## 2014-03-05 NOTE — Telephone Encounter (Signed)
Cassandra Rios has been made aware.     KP

## 2014-03-05 NOTE — Telephone Encounter (Signed)
Message copied by Ewing Schlein on Thu Mar 05, 2014  8:15 AM ------      Message from: Rosalita Chessman      Created: Wed Mar 04, 2014  6:05 PM       aorta      ----- Message -----         From: Ewing Schlein, CMA         Sent: 03/04/2014   4:10 PM           To: Rosalita Chessman, DO            Dr.Lowne would you like the whole abdomen or are you do you just want the aorta?                         KP      ----- Message -----         From: Irven Baltimore         Sent: 03/04/2014   3:53 PM           To: Ewing Schlein, CMA            Please call Gerald Stabs in imaging. She has questions regarding procedure for next week            [id number]             ------

## 2014-03-09 ENCOUNTER — Ambulatory Visit (HOSPITAL_BASED_OUTPATIENT_CLINIC_OR_DEPARTMENT_OTHER)
Admission: RE | Admit: 2014-03-09 | Discharge: 2014-03-09 | Disposition: A | Discharge status: Home / Self Care | Payer: Managed Care, Other (non HMO) | Source: Ambulatory Visit | Attending: Family Medicine | Admitting: Family Medicine

## 2014-03-09 DIAGNOSIS — Z8249 Family history of ischemic heart disease and other diseases of the circulatory system: Secondary | ICD-10-CM | POA: Insufficient documentation

## 2014-03-23 ENCOUNTER — Ambulatory Visit (INDEPENDENT_AMBULATORY_CARE_PROVIDER_SITE_OTHER): Payer: Managed Care, Other (non HMO) | Admitting: Family Medicine

## 2014-03-23 ENCOUNTER — Encounter: Payer: Self-pay | Admitting: Family Medicine

## 2014-03-23 VITALS — BP 130/82 | HR 63 | Temp 98.1°F | Resp 17 | Wt 248.5 lb

## 2014-03-23 DIAGNOSIS — J441 Chronic obstructive pulmonary disease with (acute) exacerbation: Secondary | ICD-10-CM

## 2014-03-23 DIAGNOSIS — J0120 Acute ethmoidal sinusitis, unspecified: Secondary | ICD-10-CM

## 2014-03-23 DIAGNOSIS — J209 Acute bronchitis, unspecified: Secondary | ICD-10-CM

## 2014-03-23 DIAGNOSIS — J440 Chronic obstructive pulmonary disease with acute lower respiratory infection: Secondary | ICD-10-CM

## 2014-03-23 MED ORDER — BENZONATATE 200 MG PO CAPS: 200.0000 mg | ORAL_CAPSULE | Freq: Three times a day (TID) | ORAL | Status: DC | PRN

## 2014-03-23 MED ORDER — AMOXICILLIN 875 MG PO TABS: 875.0000 mg | ORAL_TABLET | Freq: Two times a day (BID) | ORAL | Status: DC

## 2014-03-23 MED ORDER — MOMETASONE FUROATE 50 MCG/ACT NA SUSP: 2.0000 | Freq: Every day | NASAL | Status: DC

## 2014-03-23 MED ORDER — FLUTICASONE-SALMETEROL 115-21 MCG/ACT IN AERO: 2.0000 | INHALATION_SPRAY | Freq: Two times a day (BID) | RESPIRATORY_TRACT | Status: DC

## 2014-03-23 NOTE — Patient Instructions (Signed)
Follow up as needed Start the Amoxicillin twice daily as directed- take w/ food Drink plenty of fluids Start the Nasonex daily- 2 sprays each nostril Use the Advair as needed for shortness of breath, wheezing, increased cough Tessalon as needed for cough REST! Hang in there!!!

## 2014-03-23 NOTE — Assessment & Plan Note (Signed)
New to provider, pt w/ hx of similar.  Refill provided on Advair.  Start abx for sinusitis.  Cough meds prn.  Reviewed supportive care and red flags that should prompt return.  Pt expressed understanding and is in agreement w/ plan.

## 2014-03-23 NOTE — Progress Notes (Signed)
Subjective:    Patient ID: Cassandra Rios, female    DOB: 07-Jul-1950, 63 y.o.   MRN: [id number]  Sinusitis Associated symptoms include coughing.  Cough  Sore Throat  Associated symptoms include coughing.   URI- 'it's my sinuses, drainage down into my throat, throat feels like someone put acid in it, my ears'.  + HA, facial pain above the eyes.  No tooth pain.  No fevers.  No N/V.  + sick contacts.  + cough, woke pt from sleep last night.  Out of tessalon.   Review of Systems  Respiratory: Positive for cough.    For ROS see HPI     Objective:   Physical Exam  Vitals reviewed. Constitutional: She appears well-developed and well-nourished. No distress.  HENT:  Head: Normocephalic and atraumatic.  Right Ear: Tympanic membrane normal.  Left Ear: Tympanic membrane normal.  Nose: Mucosal edema and rhinorrhea present. Right sinus exhibits maxillary sinus tenderness and frontal sinus tenderness. Left sinus exhibits maxillary sinus tenderness and frontal sinus tenderness.  Mouth/Throat: Uvula is midline and mucous membranes are normal. Posterior oropharyngeal erythema present. No oropharyngeal exudate.  Eyes: Conjunctivae and EOM are normal. Pupils are equal, round, and reactive to light.  Neck: Normal range of motion. Neck supple.  Cardiovascular: Normal rate, regular rhythm and normal heart sounds.   Pulmonary/Chest: Effort normal and breath sounds normal. No respiratory distress. She has no wheezes.  Lymphadenopathy:    She has no cervical adenopathy.          Assessment & Plan:

## 2014-03-23 NOTE — Progress Notes (Signed)
Pre visit review using our clinic review tool, if applicable. No additional management support is needed unless otherwise documented below in the visit note. 

## 2014-03-23 NOTE — Assessment & Plan Note (Signed)
Pt's sxs and PE consistent w/ infxn.  Start abx.  Reviewed supportive care and red flags that should prompt return.  Pt expressed understanding and is in agreement w/ plan.  

## 2014-03-24 ENCOUNTER — Encounter: Payer: Self-pay | Admitting: Family Medicine

## 2014-04-01 ENCOUNTER — Encounter: Payer: Self-pay | Admitting: Adult Health

## 2014-04-01 ENCOUNTER — Ambulatory Visit (INDEPENDENT_AMBULATORY_CARE_PROVIDER_SITE_OTHER): Payer: 59 | Admitting: Adult Health

## 2014-04-01 VITALS — BP 144/68 | HR 56 | Ht 66.0 in | Wt 251.2 lb

## 2014-04-01 DIAGNOSIS — G35 Multiple sclerosis: Secondary | ICD-10-CM

## 2014-04-01 DIAGNOSIS — E669 Obesity, unspecified: Secondary | ICD-10-CM

## 2014-04-01 DIAGNOSIS — Z5181 Encounter for therapeutic drug level monitoring: Secondary | ICD-10-CM

## 2014-04-01 NOTE — Progress Notes (Signed)
PATIENT: Cassandra Rios DOB: 12-27-1950  REASON FOR VISIT: follow up HISTORY FROM: patient  HISTORY OF PRESENT ILLNESS: Ms. Cassandra Rios is a 63 year old right-handed white female with a history of obesity and multiple sclerosis. She returns today for follow-up. The patient is on Betaseron and is tolerating it well. She denies any new weakness or numbness. She states that over time her fingers have gotten numb. She has some muscle aches intermittently. Patient states that she fell out of the shower in July- She states that she dropped her razor in the shower and she almost stepped on it but when she changed positions quickly that caused her to fall. Otherwise no changes in her gait or balance.  No changes with her bowels or bladder. No changes in her vision. Since her last visit, she was diagnosed with an abdominal aortic aneurysm they are just monitoring it for now.  HISTORY: Ms. Cassandra Rios is a 63 year old right-handed white female with a history of obesity and multiple sclerosis. She returns today for follow-up. The patient is on Betaseron and is tolerating it well. She denies having any numbness but does say she has generalized weakness and fatigue. States that she feels more burning in her muscles- usually in her back. Denies having any balance issues, states that if she moves quickly then she may get off balance. Patient states she did fall on the ice back in February. Patient had x-rays (lumbar and both hips) which were unremarkable. She continues to have pain in the lumbar region. Her orthopedic physician ordered an MRI and she is waiting to get that done. She takes hydrocodone and celebrex for the pain. Does not use an assistive device when ambulating. Denies any visual or cognitive changes. She follows up with an eye MD yearly. Denies trouble with bowels or bladder. No new medical issues since the last visit.    REVIEW OF SYSTEMS: Full 14 system review of systems performed  and notable only for:  Constitutional: Fatigue  Eyes: Eye itching, eye redness Ear/Nose/Throat: Runny nose Skin: N/A  Cardiovascular: N/A  Respiratory: Cough, shortness of breath Gastrointestinal: Abdominal pain  Genitourinary: N/A Hematology/Lymphatic: N/A  Endocrine: N/A Musculoskeletal: Back pain, aching muscles, muscle cramps, walking difficulty Allergy/Immunology: N/A  Neurological: Memory loss, headache, numbness, weakness  Psychiatric: N/A Sleep: Frequent waking   ALLERGIES: Allergies  Allergen Reactions  . Codeine   . Hydromorphone Hcl     REACTION: nausea/vomiting  . Ibuprofen     REACTION: stomach ulcers  . Morphine Sulfate     HOME MEDICATIONS: Outpatient Prescriptions Prior to Visit  Medication Sig Dispense Refill  . amoxicillin (AMOXIL) 875 MG tablet Take 1 tablet (875 mg total) by mouth 2 (two) times daily.  20 tablet  0  . aspirin 81 MG tablet Take 81 mg by mouth daily.      Marland Kitchen atorvastatin (LIPITOR) 20 MG tablet TAKE 1 TABLET (20 MG TOTAL) BY MOUTH DAILY.  90 tablet  1  . benzonatate (TESSALON) 200 MG capsule Take 1 capsule (200 mg total) by mouth 3 (three) times daily as needed for cough.  60 capsule  0  . Calcium Carbonate-Vitamin D (CALCIUM 600 + D PO) Take by mouth daily.        . celecoxib (CELEBREX) 200 MG capsule Take 200 mg by mouth daily as needed.      Marland Kitchen FLUoxetine (PROZAC) 40 MG capsule Take 1 capsule (40 mg total) by mouth daily.  90 capsule  3  .  HYDROcodone-acetaminophen (NORCO/VICODIN) 5-325 MG per tablet Take 1 tablet by mouth every 6 (six) hours as needed for moderate pain.      . Interferon Beta-1b (BETASERON) 0.3 MG KIT injection Inject 0.25 mg into the skin every other day.  1 kit  6  . levothyroxine (SYNTHROID, LEVOTHROID) 112 MCG tablet 1 po qd  90 tablet  3  . losartan-hydrochlorothiazide (HYZAAR) 100-12.5 MG per tablet Take 1 tablet by mouth daily.  90 tablet  3  . mometasone (NASONEX) 50 MCG/ACT nasal spray Place 2 sprays into the  nose daily.  51 g  3  . Multiple Vitamin (MULTIVITAMIN) tablet Take 1 tablet by mouth 2 (two) times daily.        . traMADol (ULTRAM) 50 MG tablet Take 1 tablet (50 mg total) by mouth every 6 (six) hours as needed.  30 tablet  2  . fluticasone-salmeterol (ADVAIR HFA) 115-21 MCG/ACT inhaler Inhale 2 puffs into the lungs 2 (two) times daily.  3 Inhaler  3  . fluticasone (FLONASE) 50 MCG/ACT nasal spray Place 2 sprays into both nostrils daily.  16 g  11   No facility-administered medications prior to visit.    PAST MEDICAL HISTORY: Past Medical History  Diagnosis Date  . MS (multiple sclerosis)   . Obesity   . Allergic rhinitis   . Stomach ulcer from aspirin/ibuprofen-like drugs (NSAID's)   . Colon polyps   . COPD (chronic obstructive pulmonary disease)     Dr. Marchelle Gearing  . Hypothyroidism   . Chronic hoarseness   . Hypertension   . Dyslipidemia     PAST SURGICAL HISTORY: Past Surgical History  Procedure Laterality Date  . Cholecystectomy    . Carpal tunnel release    . Bariatric surgery  09/07/08    lap band  . Hiatal hernia repair  09/07/08  . Abdominal hysterectomy    . Cholecystectomy      FAMILY HISTORY: Family History  Problem Relation Age of Onset  . COPD Father   . Heart disease Father   . COPD Sister   . Arthritis Sister   . Stroke Mother 74  . Heart disease Mother     congenital heart defect?    SOCIAL HISTORY: History   Social History  . Marital Status: Married    Spouse Name: Casimiro Needle    Number of Children: 2  . Years of Education: 16   Occupational History  . 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 Smoking status: Former Smoker -- 2.00 packs/day for 35 years    Types: Cigarettes    Quit date: 04/05/2007  . Smokeless tobacco: Never Used  . Alcohol Use: No     Comment: very rare  . Drug Use: No  . Sexual Activity: Yes    Partners: Male   Other Topics Concern  . Not on file    Social History Narrative   Patient is right handed,reside in home with husband      PHYSICAL EXAM  Filed Vitals:   04/01/14 1603  BP: 144/68  Pulse: 56  Height: 5\' 6"  (1.676 m)  Weight: 251 lb 3.2 oz (113.944 kg)   Body mass index is 40.56 kg/(m^2).  Generalized: Well developed, in no acute distress   Neurological examination  Mentation: Alert oriented to time, place, history taking. Follows all commands speech and language fluent Cranial nerve II-XII: Pupils were equal round reactive to light. Extraocular movements were full,  visual field were full on confrontational test. Facial sensation and strength were normal.  Uvula tongue midline. Head turning and shoulder shrug  were normal and symmetric. Motor: The motor testing reveals 5 over 5 strength of all 4 extremities. Good symmetric motor tone is noted throughout.  Sensory: Sensory testing is intact to soft touch on all 4 extremities. No evidence of extinction is noted.  Coordination: Cerebellar testing reveals good finger-nose-finger and heel-to-shin bilaterally.  Gait and station: Gait is normal. Tandem gait is normal. Romberg is negative. No drift is seen.  Reflexes: Deep tendon reflexes are symmetric and normal bilaterally.   DIAGNOSTIC DATA (LABS, IMAGING, TESTING) - I reviewed patient records, labs, notes, testing and imaging myself where available.  Lab Results  Component Value Date   WBC 6.4 07/08/2013   HGB 14.0 07/08/2013   HCT 43.7 07/08/2013   MCV 92.5 07/08/2013   PLT 100.0* 07/08/2013      Component Value Date/Time   NA 138 07/08/2013 0806   K 4.0 07/08/2013 0806   CL 103 07/08/2013 0806   CO2 27 07/08/2013 0806   GLUCOSE 88 07/08/2013 0806   BUN 13 07/08/2013 0806   CREATININE 0.9 07/08/2013 0806   CALCIUM 9.3 07/08/2013 0806   PROT 7.2 07/08/2013 0806   ALBUMIN 3.7 07/08/2013 0806   AST 24 07/08/2013 0806   ALT 24 07/08/2013 0806   ALKPHOS 76 07/08/2013 0806   BILITOT 0.6 07/08/2013 0806   GFRNONAA 77.78 01/17/2010 0844   GFRAA   Value: >60        The eGFR has been calculated using the MDRD equation. This calculation has not been validated in all clinical situations. eGFR's persistently <60 mL/min signify possible Chronic Kidney Disease. 09/03/2008 1355   Lab Results  Component Value Date   CHOL 156 07/08/2013   HDL 46.80 07/08/2013   LDLCALC 88 07/08/2013   TRIG 104.0 07/08/2013   CHOLHDL 3 07/08/2013   Lab Results  Component Value Date   HGBA1C 5.7 01/25/2012   Lab Results  Component Value Date   VITAMINB12 648 03/16/2010   Lab Results  Component Value Date   TSH 1.06 07/08/2013      ASSESSMENT AND PLAN 63 y.o. year old female  has a past medical history of MS (multiple sclerosis); Obesity; Allergic rhinitis; Stomach ulcer from aspirin/ibuprofen-like drugs (NSAID's); Colon polyps; COPD (chronic obstructive pulmonary disease); Hypothyroidism; Chronic hoarseness; Hypertension; and Dyslipidemia. here with   1. Multiple sclerosis 2. Obesity  Overall patient is doing well. She will continue to take the Betaseron. She has not had an MRI of the brain since December 2013. I will order an MRI of the brain to look for progression of multiple sclerosis. I will also check blood work today- CBC, CMP. Patient exam today was unremarkable. If she has worsening of symptoms or develops new symptoms she should let us know. otherwise she will followup in 6 months.  Butch Penny, MSN, NP-C 04/01/2014, 4:08 PM [facility] [address], [address] [place], [place] [address] [phone]  Note: This document was prepared with digital dictation and possible smart phrase technology. Any transcriptional errors that result from this process are unintentional.

## 2014-04-01 NOTE — Patient Instructions (Signed)

## 2014-04-01 NOTE — Progress Notes (Signed)
I have read the note, and I agree with the clinical assessment and plan.  Lyndsee Casa KEITH   

## 2014-04-02 ENCOUNTER — Telehealth: Payer: Self-pay | Admitting: Adult Health

## 2014-04-02 LAB — CBC WITH DIFFERENTIAL
Basophils Absolute: 0 10*3/uL (ref 0.0–0.2)
Basos: 0 %
EOS ABS: 0.1 10*3/uL (ref 0.0–0.4)
Eos: 1 %
HCT: 41.1 % (ref 34.0–46.6)
Hemoglobin: 14.4 g/dL (ref 11.1–15.9)
IMMATURE GRANULOCYTES: 0 %
Immature Grans (Abs): 0 10*3/uL (ref 0.0–0.1)
Lymphocytes Absolute: 2.6 10*3/uL (ref 0.7–3.1)
Lymphs: 27 %
MCH: 30.4 pg (ref 26.6–33.0)
MCHC: 35 g/dL (ref 31.5–35.7)
MCV: 87 fL (ref 79–97)
MONOS ABS: 1.1 10*3/uL — AB (ref 0.1–0.9)
Monocytes: 11 %
NEUTROS PCT: 61 %
Neutrophils Absolute: 5.7 10*3/uL (ref 1.4–7.0)
Platelets: 168 10*3/uL (ref 150–379)
RBC: 4.74 x10E6/uL (ref 3.77–5.28)
RDW: 13.1 % (ref 12.3–15.4)
WBC: 9.5 10*3/uL (ref 3.4–10.8)

## 2014-04-02 LAB — COMPREHENSIVE METABOLIC PANEL
ALT: 20 IU/L (ref 0–32)
AST: 22 IU/L (ref 0–40)
Albumin/Globulin Ratio: 1.5 (ref 1.1–2.5)
Albumin: 4.1 g/dL (ref 3.6–4.8)
Alkaline Phosphatase: 91 IU/L (ref 39–117)
BUN / CREAT RATIO: 21 (ref 11–26)
BUN: 22 mg/dL (ref 8–27)
CO2: 24 mmol/L (ref 18–29)
CREATININE: 1.03 mg/dL — AB (ref 0.57–1.00)
Calcium: 9.6 mg/dL (ref 8.7–10.3)
Chloride: 96 mmol/L — ABNORMAL LOW (ref 97–108)
GFR calc Af Amer: 66 mL/min/{1.73_m2} (ref >59)
GFR calc non Af Amer: 58 mL/min/{1.73_m2} — ABNORMAL LOW (ref >59)
GLOBULIN, TOTAL: 2.7 g/dL (ref 1.5–4.5)
Glucose: 91 mg/dL (ref 65–99)
Potassium: 4.3 mmol/L (ref 3.5–5.2)
SODIUM: 135 mmol/L (ref 134–144)
Total Bilirubin: <0.2 mg/dL (ref 0.0–1.2)
Total Protein: 6.8 g/dL (ref 6.0–8.5)

## 2014-04-02 NOTE — Telephone Encounter (Signed)
I called the patient. Her creatinine level is 1.03 which is barely elevated. Her GFR is 58 and normal GFR is considered greater than 59. The patient can proceed with having the MRI the brain with contrast. She has no history of any type of kidney disease. She does have a history of hypertension controlled with medication. Patient verbalized understanding

## 2014-04-20 ENCOUNTER — Telehealth: Payer: Self-pay | Admitting: Adult Health

## 2014-04-20 NOTE — Telephone Encounter (Signed)
I called the patient and left message on her home phone as well as work phone. Her MRI of the brain showed no significant change compared to the previous MRI. Her scan was actually read by [facility]. I asked Dr. Leonie Man to compare the results to her previous scans. He reports that there has been no significant change. Please relay this message to the patient when she calls back.

## 2014-04-20 NOTE — Telephone Encounter (Signed)
Patient returned call and I relayed Jinny Blossom, NP's message to patient.

## 2014-04-27 ENCOUNTER — Encounter: Payer: Self-pay | Admitting: Medical

## 2014-04-27 ENCOUNTER — Ambulatory Visit (INDEPENDENT_AMBULATORY_CARE_PROVIDER_SITE_OTHER): Payer: Managed Care, Other (non HMO) | Admitting: Medical

## 2014-04-27 VITALS — BP 118/76 | HR 64 | Temp 97.6°F | Ht 66.0 in | Wt 249.6 lb

## 2014-04-27 DIAGNOSIS — N390 Urinary tract infection, site not specified: Secondary | ICD-10-CM

## 2014-04-27 DIAGNOSIS — R350 Frequency of micturition: Secondary | ICD-10-CM

## 2014-04-27 DIAGNOSIS — R822 Biliuria: Secondary | ICD-10-CM

## 2014-04-27 LAB — POCT URINALYSIS DIPSTICK
Glucose, UA: NEGATIVE
Ketones, UA: 15
NITRITE UA: NEGATIVE
PH UA: 6
Protein, UA: 30
Spec Grav, UA: 1.02
UROBILINOGEN UA: 0.2

## 2014-04-27 MED ORDER — CIPROFLOXACIN HCL 500 MG PO TABS: 500.0000 mg | ORAL_TABLET | Freq: Two times a day (BID) | ORAL | Status: DC

## 2014-04-27 MED ORDER — PHENAZOPYRIDINE HCL 200 MG PO TABS: 200.0000 mg | ORAL_TABLET | Freq: Three times a day (TID) | ORAL | Status: DC | PRN

## 2014-04-27 NOTE — Progress Notes (Signed)
Subjective:    Patient ID: Cassandra Rios, female    DOB: 08/13/1950, 63 y.o.   MRN: [id number]  HPI   Pt in with some recent lower back pain over weekend. She has history of back pain from fall and got injection from her specialist. She thought she had muscle pain. But she noted pain little more over her cva areas. Then she developed burning on urination. Frequent urination. NO nausea, no vomiting, no fever and no chills. Years ago had history of uti. Was on macroid past for prevention. But for 20 yrs no pretentative med.  Suprapubic pressure but no abdomen pain.  Past Medical History  Diagnosis Date  . MS (multiple sclerosis)   . Obesity   . Allergic rhinitis   . Stomach ulcer from aspirin/ibuprofen-like drugs (NSAID's)   . Colon polyps   . COPD (chronic obstructive pulmonary disease)     Dr. Marchelle Gearing  . Hypothyroidism   . Chronic hoarseness   . Hypertension   . Dyslipidemia     History   Social History  . Marital Status: Married    Spouse Name: Casimiro Needle    Number of Children: 2  . Years of Education: 16   Occupational History  . 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 Smoking status: Former Smoker -- 2.00 packs/day for 35 years    Types: Cigarettes    Quit date: 04/05/2007  . Smokeless tobacco: Never Used  . Alcohol Use: No     Comment: very rare  . Drug Use: No  . Sexual Activity:    Partners: Male   Other Topics Concern  . Not on file   Social History Narrative   Patient is right handed,reside in home with husband    Past Surgical History  Procedure Laterality Date  . Cholecystectomy    . Carpal tunnel release    . Bariatric surgery  09/07/08    lap band  . Hiatal hernia repair  09/07/08  . Abdominal hysterectomy    . Cholecystectomy      Family History  Problem Relation Age of Onset  . COPD Father   . Heart disease Father   . COPD Sister   . Arthritis Sister   . Stroke  Mother 68  . Heart disease Mother     congenital heart defect?    Allergies  Allergen Reactions  . Codeine   . Hydromorphone Hcl     REACTION: nausea/vomiting  . Ibuprofen     REACTION: stomach ulcers  . Morphine Sulfate     Current Outpatient Prescriptions on File Prior to Visit  Medication Sig Dispense Refill  . aspirin 81 MG tablet Take 81 mg by mouth daily.    Marland Kitchen atorvastatin (LIPITOR) 20 MG tablet TAKE 1 TABLET (20 MG TOTAL) BY MOUTH DAILY. 90 tablet 1  . benzonatate (TESSALON) 200 MG capsule Take 1 capsule (200 mg total) by mouth 3 (three) times daily as needed for cough. 60 capsule 0  . Calcium Carbonate-Vitamin D (CALCIUM 600 + D PO) Take by mouth daily.      . celecoxib (CELEBREX) 200 MG capsule Take 200 mg by mouth daily as needed.    Marland Kitchen FLUoxetine (PROZAC) 40 MG capsule Take 1 capsule (40 mg total) by mouth daily. 90 capsule 3  . fluticasone-salmeterol (ADVAIR HFA) 115-21 MCG/ACT inhaler Inhale 2 puffs into the lungs as needed.    Marland Kitchen HYDROcodone-acetaminophen (NORCO/VICODIN) 5-325  MG per tablet Take 1 tablet by mouth every 6 (six) hours as needed for moderate pain.    . Interferon Beta-1b (BETASERON) 0.3 MG KIT injection Inject 0.25 mg into the skin every other day. 1 kit 6  . levothyroxine (SYNTHROID, LEVOTHROID) 112 MCG tablet 1 po qd 90 tablet 3  . losartan-hydrochlorothiazide (HYZAAR) 100-12.5 MG per tablet Take 1 tablet by mouth daily. 90 tablet 3  . mometasone (NASONEX) 50 MCG/ACT nasal spray Place 2 sprays into the nose daily. 51 g 3  . Multiple Vitamin (MULTIVITAMIN) tablet Take 1 tablet by mouth 2 (two) times daily.      . traMADol (ULTRAM) 50 MG tablet Take 1 tablet (50 mg total) by mouth every 6 (six) hours as needed. 30 tablet 2  . amoxicillin (AMOXIL) 875 MG tablet Take 1 tablet (875 mg total) by mouth 2 (two) times daily. (Patient not taking: Reported on 04/27/2014) 20 tablet 0   No current facility-administered medications on file prior to visit.    BP  118/76 mmHg  Pulse 64  Temp(Src) 97.6 F (36.4 C) (Oral)  Ht 5\' 6"  (1.676 m)  Wt 249 lb 9.6 oz (113.218 kg)  BMI 40.31 kg/m2  SpO2 97%      Review of Systems  Constitutional: Negative for fever, chills and fatigue.  Respiratory: Negative for cough, chest tightness, shortness of breath and wheezing.   Cardiovascular: Negative for chest pain and palpitations.  Gastrointestinal: Positive for abdominal pain. Negative for nausea, vomiting, diarrhea and constipation.       Suprapubic pressure.  Genitourinary: Positive for dysuria, urgency and frequency. Negative for flank pain, vaginal bleeding, vaginal pain and pelvic pain.  Musculoskeletal: Positive for back pain.       Over cva area faint pain.  Neurological: Negative for dizziness and headaches.  Psychiatric/Behavioral: Negative for suicidal ideas.       Objective:   Physical Exam   General  Mental Status- Alert. Orientation- Orientation x 4.   Skin General:- Normal. Moisture- Dry. Temperature- Warm.  HEENT Head- normal.  Neck Neck- Supple.  Heart Ausculation-RRR  Lungs Ausculation- Clear, even, unlabored bilaterlly.    Abdomen Palpation/Percussion: Palpation and Percussion of the abdomen reveal- mild faint Tender only in mid suprapubic region, No Rebound tenderness, No Rigidity(guarding), No Palpable abdominal masses and No jar tenderness. No suprapubic tenderness. Liver:-Normal. Spleen:- Normal. Other Characteristics- only very faintCostovertebral angle tenderness- Left side and some faint  Costovertebral angle tenderness- Right.  Auscultation: Auscultation of the abdomen reveals- Bowel Sounds normal.         Assessment & Plan:

## 2014-04-27 NOTE — Progress Notes (Signed)
Pre visit review using our clinic review tool, if applicable. No additional management support is needed unless otherwise documented below in the visit note. 

## 2014-04-27 NOTE — Patient Instructions (Signed)
You do appear to have urinary infection based on signs and symptoms as well as ua. We will send your urine for culture.   I am prescribing cipro antibiotic and pyridium for pain  Follow up in 7 days or as needed.

## 2014-04-28 DIAGNOSIS — N390 Urinary tract infection, site not specified: Secondary | ICD-10-CM | POA: Insufficient documentation

## 2014-04-28 LAB — COMPREHENSIVE METABOLIC PANEL
ALBUMIN: 3.8 g/dL (ref 3.5–5.2)
ALT: 29 U/L (ref 0–35)
AST: 27 U/L (ref 0–37)
Alkaline Phosphatase: 80 U/L (ref 39–117)
BUN: 30 mg/dL — AB (ref 6–23)
CHLORIDE: 101 meq/L (ref 96–112)
CO2: 27 mEq/L (ref 19–32)
CREATININE: 1.4 mg/dL — AB (ref 0.4–1.2)
Calcium: 9.7 mg/dL (ref 8.4–10.5)
GFR: 40.88 mL/min — AB (ref >60.00)
Glucose, Bld: 101 mg/dL — ABNORMAL HIGH (ref 70–99)
Potassium: 5.2 mEq/L — ABNORMAL HIGH (ref 3.5–5.1)
Sodium: 137 mEq/L (ref 135–145)
Total Bilirubin: 0.4 mg/dL (ref 0.2–1.2)
Total Protein: 7.2 g/dL (ref 6.0–8.3)

## 2014-04-28 NOTE — Assessment & Plan Note (Signed)
We will send your urine for culture. Suspicious  for infection by ua and her symptoms.   I am prescribing cipro antibiotic for infection and pyridium for pain.  Pt could not give adequate urine volume for culture. She is returning on Tuesday am next morning and dropping of another urine sample to culture.

## 2014-05-02 LAB — CULTURE, URINE COMPREHENSIVE: Colony Count: 50000

## 2014-05-04 ENCOUNTER — Other Ambulatory Visit: Payer: Self-pay | Admitting: Medical

## 2014-05-05 ENCOUNTER — Other Ambulatory Visit: Payer: Self-pay

## 2014-05-05 MED ORDER — DOXYCYCLINE HYCLATE 100 MG PO TABS: 100.0000 mg | ORAL_TABLET | Freq: Two times a day (BID) | ORAL | Status: DC

## 2014-05-13 ENCOUNTER — Other Ambulatory Visit (INDEPENDENT_AMBULATORY_CARE_PROVIDER_SITE_OTHER): Payer: Managed Care, Other (non HMO)

## 2014-05-13 DIAGNOSIS — N289 Disorder of kidney and ureter, unspecified: Secondary | ICD-10-CM

## 2014-05-13 DIAGNOSIS — E875 Hyperkalemia: Secondary | ICD-10-CM

## 2014-05-13 LAB — COMPLETE METABOLIC PANEL WITH GFR
ALK PHOS: 60 U/L (ref 39–117)
ALT: 23 U/L (ref 0–35)
AST: 22 U/L (ref 0–37)
Albumin: 3.5 g/dL (ref 3.5–5.2)
BUN: 19 mg/dL (ref 6–23)
CALCIUM: 9.3 mg/dL (ref 8.4–10.5)
CHLORIDE: 101 meq/L (ref 96–112)
CO2: 27 mEq/L (ref 19–32)
Creat: 1.04 mg/dL (ref 0.50–1.10)
GFR, Est African American: 66 mL/min
GFR, Est Non African American: 57 mL/min — ABNORMAL LOW
Glucose, Bld: 102 mg/dL — ABNORMAL HIGH (ref 70–99)
POTASSIUM: 4.9 meq/L (ref 3.5–5.3)
Sodium: 137 mEq/L (ref 135–145)
Total Bilirubin: 0.6 mg/dL (ref 0.2–1.2)
Total Protein: 6.4 g/dL (ref 6.0–8.3)

## 2014-05-18 ENCOUNTER — Other Ambulatory Visit: Payer: Self-pay | Admitting: Gastroenterology

## 2014-05-25 ENCOUNTER — Encounter: Payer: Self-pay | Admitting: Neurology

## 2014-06-09 ENCOUNTER — Encounter: Payer: Self-pay | Admitting: Family Medicine

## 2014-06-17 ENCOUNTER — Other Ambulatory Visit: Payer: Self-pay | Admitting: Family Medicine

## 2014-06-17 NOTE — Telephone Encounter (Signed)
Rx refilled for one month, but patient is due for a physical.  Please call to schedule.     eal

## 2014-06-29 ENCOUNTER — Other Ambulatory Visit: Payer: Self-pay

## 2014-06-29 DIAGNOSIS — F419 Anxiety disorder, unspecified: Secondary | ICD-10-CM

## 2014-06-29 MED ORDER — FLUOXETINE HCL 40 MG PO CAPS: 40.0000 mg | ORAL_CAPSULE | Freq: Every day | ORAL | Status: DC

## 2014-07-06 ENCOUNTER — Encounter: Payer: Self-pay | Admitting: Medical

## 2014-07-06 ENCOUNTER — Ambulatory Visit (INDEPENDENT_AMBULATORY_CARE_PROVIDER_SITE_OTHER): Payer: Managed Care, Other (non HMO) | Admitting: Medical

## 2014-07-06 VITALS — BP 138/83 | HR 68 | Temp 98.6°F | Ht 66.0 in | Wt 251.0 lb

## 2014-07-06 DIAGNOSIS — M545 Low back pain, unspecified: Secondary | ICD-10-CM

## 2014-07-06 DIAGNOSIS — E038 Other specified hypothyroidism: Secondary | ICD-10-CM

## 2014-07-06 DIAGNOSIS — M546 Pain in thoracic spine: Secondary | ICD-10-CM | POA: Insufficient documentation

## 2014-07-06 DIAGNOSIS — F329 Major depressive disorder, single episode, unspecified: Secondary | ICD-10-CM | POA: Insufficient documentation

## 2014-07-06 DIAGNOSIS — E039 Hypothyroidism, unspecified: Secondary | ICD-10-CM

## 2014-07-06 DIAGNOSIS — F32A Depression, unspecified: Secondary | ICD-10-CM

## 2014-07-06 DIAGNOSIS — F419 Anxiety disorder, unspecified: Secondary | ICD-10-CM

## 2014-07-06 LAB — TSH: TSH: 1.44 u[IU]/mL (ref 0.35–4.50)

## 2014-07-06 MED ORDER — FLUOXETINE HCL 40 MG PO CAPS: 40.0000 mg | ORAL_CAPSULE | Freq: Every day | ORAL | Status: DC

## 2014-07-06 MED ORDER — TRAMADOL HCL 50 MG PO TABS: ORAL_TABLET | ORAL | Status: DC

## 2014-07-06 MED ORDER — LEVOTHYROXINE SODIUM 112 MCG PO TABS: 112.0000 ug | ORAL_TABLET | Freq: Every day | ORAL | Status: DC

## 2014-07-06 NOTE — Progress Notes (Signed)
Subjective:    Patient ID: Cassandra Rios, female    DOB: 31-Oct-1950, 64 y.o.   MRN: [id number]  HPI   Pt is in for refill of her hypothyroid medications.   Pt describes she is states she uses hydrocodone for severe pain. Usually takes it for the lower back pain. Pt has been seen by Dr. Reynolds Bowl in the past but also by Dr. Ethelene Hal physical medicine. I asked pt if she has contract for pain medication  and she tells me no. I looked in media and did not see one in our chart.  [name] is in [facility]. He does physical medicine. Pt has seen them for about a year.    Pt got occasional HA in the past and Dr. Laury Axon per pt gave tramadol prescription. She states this helped for her back pain as well. In fact per pt this helped her reduce use of hydrocodone.  Pt take prozac for depression. It is working now for her. Denies recent depression or anxiety.       Review of Systems  Constitutional: Negative for fever, chills and fatigue.  Respiratory: Negative for cough, chest tightness and wheezing.   Cardiovascular: Negative for chest pain and palpitations.  Gastrointestinal: Negative for nausea, vomiting and abdominal pain.  Genitourinary: Negative for dysuria, urgency, hematuria and flank pain.  Musculoskeletal: Positive for back pain.  Neurological: Negative for weakness and numbness.  Psychiatric/Behavioral: Negative for behavioral problems and confusion. The patient is not nervous/anxious.        Mood stable per pt.   Past Medical History  Diagnosis Date  . MS (multiple sclerosis)   . Obesity   . Allergic rhinitis   . Stomach ulcer from aspirin/ibuprofen-like drugs (NSAID's)   . Colon polyps   . COPD (chronic obstructive pulmonary disease)     Dr. Marchelle Gearing  . Hypothyroidism   . Chronic hoarseness   . Hypertension   . Dyslipidemia     History   Social History  . Marital Status: Married    Spouse Name: Casimiro Needle    Number of Children: 2  . Years of  Education: 16   Occupational History  . 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 Smoking status: Former Smoker -- 2.00 packs/day for 35 years    Types: Cigarettes    Quit date: 04/05/2007  . Smokeless tobacco: Never Used  . Alcohol Use: No     Comment: very rare  . Drug Use: No  . Sexual Activity:    Partners: Male   Other Topics Concern  . Not on file   Social History Narrative   Patient is right handed,reside in home with husband    Past Surgical History  Procedure Laterality Date  . Cholecystectomy    . Carpal tunnel release    . Bariatric surgery  09/07/08    lap band  . Hiatal hernia repair  09/07/08  . Abdominal hysterectomy    . Cholecystectomy      Family History  Problem Relation Age of Onset  . COPD Father   . Heart disease Father   . COPD Sister   . Arthritis Sister   . Stroke Mother 49  . Heart disease Mother     congenital heart defect?    Allergies  Allergen Reactions  . Codeine   . Hydromorphone Hcl     REACTION: nausea/vomiting  . Ibuprofen  REACTION: stomach ulcers  . Morphine Sulfate     Current Outpatient Prescriptions on File Prior to Visit  Medication Sig Dispense Refill  . aspirin 81 MG tablet Take 81 mg by mouth daily.    Marland Kitchen atorvastatin (LIPITOR) 20 MG tablet TAKE 1 TABLET (20 MG TOTAL) BY MOUTH DAILY. 90 tablet 1  . benzonatate (TESSALON) 200 MG capsule Take 1 capsule (200 mg total) by mouth 3 (three) times daily as needed for cough. 60 capsule 0  . Calcium Carbonate-Vitamin D (CALCIUM 600 + D PO) Take by mouth daily.      . celecoxib (CELEBREX) 200 MG capsule Take 200 mg by mouth daily as needed.    . fluticasone-salmeterol (ADVAIR HFA) 115-21 MCG/ACT inhaler Inhale 2 puffs into the lungs as needed.    Marland Kitchen HYDROcodone-acetaminophen (NORCO/VICODIN) 5-325 MG per tablet Take 1 tablet by mouth every 6 (six) hours as needed for moderate pain.    . Interferon Beta-1b  (BETASERON) 0.3 MG KIT injection Inject 0.25 mg into the skin every other day. 1 kit 6  . levothyroxine (SYNTHROID, LEVOTHROID) 112 MCG tablet TAKE 1 TABLET BY MOUTH EVERY DAY 30 tablet 0  . losartan-hydrochlorothiazide (HYZAAR) 100-12.5 MG per tablet Take 1 tablet by mouth daily. 90 tablet 3  . mometasone (NASONEX) 50 MCG/ACT nasal spray Place 2 sprays into the nose daily. 51 g 3  . Multiple Vitamin (MULTIVITAMIN) tablet Take 1 tablet by mouth 2 (two) times daily.       No current facility-administered medications on file prior to visit.    BP 138/83 mmHg  Pulse 68  Temp(Src) 98.6 F (37 C) (Oral)  Ht 5\' 6"  (1.676 m)  Wt 251 lb (113.853 kg)  BMI 40.53 kg/m2  SpO2 99%       Objective:   Physical Exam  General Appearance- Not in acute distress.    Chest and Lung Exam Auscultation: Breath sounds:-Normal. Clear even and unlabored. Adventitious sounds:- No Adventitious sounds.  Cardiovascular Auscultation:Rythm - Regular, rate and rythm. Heart Sounds -Normal heart sounds.  Abdomen Inspection:-Inspection Normal.  Palpation/Perucssion: Palpation and Percussion of the abdomen reveal- Non Tender, No Rebound tenderness, No rigidity(Guarding) and No Palpable abdominal masses.  Liver:-Normal.  Spleen:- Normal.   Back Mid lumbar spine mild tenderness to palpation. Pain mild  on straight leg lift. No pain today on  lateral movements and flexion/extension of the spine.  Lower ext neurologic  L5-S1 sensation intact bilaterally. Normal patellar reflexes bilaterally. No foot drop bilaterally.      Assessment & Plan:

## 2014-07-06 NOTE — Telephone Encounter (Signed)
Thyroid med refill

## 2014-07-06 NOTE — Progress Notes (Signed)
Pre visit review using our clinic review tool, if applicable. No additional management support is needed unless otherwise documented below in the visit note. 

## 2014-07-06 NOTE — Assessment & Plan Note (Addendum)
For your depression, I will refill your prozac.

## 2014-07-06 NOTE — Assessment & Plan Note (Addendum)
For your back pain. I will give you one month only of the tramadol. You need to get further refills from Dr. Nelva Bush physical medicine. He needs to be aware that tramadol controls you pain mostly and thantyou need other meds only for break through severe pain. Also do not use any tramadol within 8 hours of any other controlled medications.

## 2014-07-06 NOTE — Assessment & Plan Note (Signed)
For your low thyroid, I want to get tsh today and then call in a prescription.

## 2014-07-06 NOTE — Patient Instructions (Addendum)
For your low thyroid, I want to get tsh today and then call in a prescription.     For your back pain. I will give you one month only of the tramadol. You need to get further refills from Dr. Nelva Bush physical medicine. He needs to be aware that tramadol controls you pain mostly and thantyou need other meds only for break through severe pain. Also do not use any tramadol within 8 hours of any other controlled medications.  Follow up as regularly scheduled with pcp or as needed

## 2014-07-07 ENCOUNTER — Ambulatory Visit: Payer: Managed Care, Other (non HMO) | Admitting: Physician Assistant

## 2014-07-09 ENCOUNTER — Other Ambulatory Visit: Payer: Self-pay | Admitting: Family Medicine

## 2014-07-10 ENCOUNTER — Other Ambulatory Visit: Payer: Self-pay

## 2014-07-10 MED ORDER — LEVOTHYROXINE SODIUM 112 MCG PO TABS
112.0000 ug | ORAL_TABLET | Freq: Every day | ORAL | Status: DC
Start: 2014-07-10 — End: 2014-08-31

## 2014-07-20 ENCOUNTER — Telehealth: Payer: Self-pay | Admitting: Medical

## 2014-07-20 NOTE — Telephone Encounter (Signed)
Opened up chart to review refill request/use of thyroid med. Will sign lpn rx she sent to me.

## 2014-08-20 ENCOUNTER — Telehealth: Payer: Self-pay | Admitting: Family Medicine

## 2014-08-20 NOTE — Telephone Encounter (Signed)
Pre visit CPE letter sent

## 2014-08-31 ENCOUNTER — Encounter: Payer: Managed Care, Other (non HMO) | Admitting: Family Medicine

## 2014-08-31 ENCOUNTER — Encounter: Payer: Self-pay | Admitting: Adult Health

## 2014-08-31 ENCOUNTER — Ambulatory Visit (INDEPENDENT_AMBULATORY_CARE_PROVIDER_SITE_OTHER): Payer: 59 | Admitting: Adult Health

## 2014-08-31 VITALS — BP 136/81 | HR 62 | Ht 66.0 in | Wt 247.0 lb

## 2014-08-31 DIAGNOSIS — G35 Multiple sclerosis: Secondary | ICD-10-CM | POA: Diagnosis not present

## 2014-08-31 NOTE — Progress Notes (Signed)
I have read the note, and I agree with the clinical assessment and plan.  WILLIS,CHARLES KEITH   

## 2014-08-31 NOTE — Patient Instructions (Signed)
Continue Betaseron Have Blood work with your PCP If you have questions about insurance and assistance with Betaseron you can call and ask Janett Billow our pharmacy tech.

## 2014-08-31 NOTE — Progress Notes (Signed)
PATIENT: Cassandra Rios DOB: [birth year]  REASON FOR VISIT: follow up- multiple sclerosis HISTORY FROM: patient   HISTORY OF PRESENT ILLNESS: Cassandra Rios is a 64 year old female with a history of obesity and multiple sclerosis. She returns today for follow-up. The patient is currently taking Betaseron and tolerating it well. At the last visit the patient had an MRI of the brain that showed no significant changes. The patient denies any new numbness or weakness. She denies any changes with her gait or balance. Denies any falls. No changes with the bowels or bladder. Patient states she does have some constipation although it occurs only when she takes her hydrocodone. No changes with her vision. She does state that she is having a gritty sensation in her eyes as well as sinus headaches. She has an appointment with her ophthalmologist in 2 weeks. She denies any new neurological symptoms. Denies any new medical issues.  HISTORY 04/01/14: Cassandra Rios is a 64 year old right-handed white female with a history of obesity and multiple sclerosis. She returns today for follow-up. The patient is on Betaseron and is tolerating it well. She denies any new weakness or numbness. She states that over time her fingers have gotten numb. She has some muscle aches intermittently. Patient states that she fell out of the shower in July- She states that she dropped her razor in the shower and she almost stepped on it but when she changed positions quickly that caused her to fall. Otherwise no changes in her gait or balance. No changes with her bowels or bladder. No changes in her vision. Since her last visit, she was diagnosed with an abdominal aortic aneurysm they are just monitoring it for now.  HISTORY: Cassandra Rios is a 64 year old right-handed white female with a history of obesity and multiple sclerosis. She returns today for follow-up. The patient is on Betaseron and is tolerating it well. She  denies having any numbness but does say she has generalized weakness and fatigue. States that she feels more burning in her muscles- usually in her back. Denies having any balance issues, states that if she moves quickly then she may get off balance. Patient states she did fall on the ice back in February. Patient had x-rays (lumbar and both hips) which were unremarkable. She continues to have pain in the lumbar region. Her orthopedic physician ordered an MRI and she is waiting to get that done. She takes hydrocodone and celebrex for the pain. Does not use an assistive device when ambulating. Denies any visual or cognitive changes. She follows up with an eye MD yearly. Denies trouble with bowels or bladder. No new medical issues since the last visit.   REVIEW OF SYSTEMS: Out of a complete 14 system review of symptoms, the patient complains only of the following symptoms, and all other reviewed systems are negative.  ALLERGIES: Allergies  Allergen Reactions  . Codeine   . Hydromorphone Hcl     REACTION: nausea/vomiting  . Ibuprofen     REACTION: stomach ulcers  . Morphine Sulfate     HOME MEDICATIONS: Outpatient Prescriptions Prior to Visit  Medication Sig Dispense Refill  . aspirin 81 MG tablet Take 81 mg by mouth daily.    Marland Kitchen atorvastatin (LIPITOR) 20 MG tablet TAKE 1 TABLET (20 MG TOTAL) BY MOUTH DAILY. 90 tablet 1  . benzonatate (TESSALON) 200 MG capsule Take 1 capsule (200 mg total) by mouth 3 (three) times daily as needed for cough. 60 capsule  0  . Calcium Carbonate-Vitamin D (CALCIUM 600 + D PO) Take by mouth daily.      . celecoxib (CELEBREX) 200 MG capsule Take 200 mg by mouth daily as needed.    Marland Kitchen FLUoxetine (PROZAC) 40 MG capsule Take 1 capsule (40 mg total) by mouth daily. 90 capsule 1  . fluticasone-salmeterol (ADVAIR HFA) 115-21 MCG/ACT inhaler Inhale 2 puffs into the lungs as needed.    Marland Kitchen HYDROcodone-acetaminophen (NORCO/VICODIN) 5-325 MG per tablet Take 1 tablet by mouth every  6 (six) hours as needed for moderate pain.    . Interferon Beta-1b (BETASERON) 0.3 MG KIT injection Inject 0.25 mg into the skin every other day. 1 kit 6  . levothyroxine (SYNTHROID, LEVOTHROID) 112 MCG tablet TAKE 1 TABLET BY MOUTH EVERY DAY 30 tablet 0  . levothyroxine (SYNTHROID, LEVOTHROID) 112 MCG tablet Take 1 tablet (112 mcg total) by mouth daily. 90 tablet 3  . losartan-hydrochlorothiazide (HYZAAR) 100-12.5 MG per tablet TAKE 1 TABLET BY MOUTH EVERY DAY 90 tablet 1  . mometasone (NASONEX) 50 MCG/ACT nasal spray Place 2 sprays into the nose daily. 51 g 3  . Multiple Vitamin (MULTIVITAMIN) tablet Take 1 tablet by mouth 2 (two) times daily.      . traMADol (ULTRAM) 50 MG tablet 1 tab po q 8 hrs prn pain 90 tablet 0   No facility-administered medications prior to visit.    PAST MEDICAL HISTORY: Past Medical History  Diagnosis Date  . MS (multiple sclerosis)   . Obesity   . Allergic rhinitis   . Stomach ulcer from aspirin/ibuprofen-like drugs (NSAID's)   . Colon polyps   . COPD (chronic obstructive pulmonary disease)     Dr. Marchelle Gearing  . Hypothyroidism   . Chronic hoarseness   . Hypertension   . Dyslipidemia     PAST SURGICAL HISTORY: Past Surgical History  Procedure Laterality Date  . Cholecystectomy    . Carpal tunnel release    . Bariatric surgery  09/07/08    lap band  . Hiatal hernia repair  09/07/08  . Abdominal hysterectomy    . Cholecystectomy      FAMILY HISTORY: Family History  Problem Relation Age of Onset  . COPD Father   . Heart disease Father   . COPD Sister   . Arthritis Sister   . Stroke Mother 55  . Heart disease Mother     congenital heart defect?    SOCIAL HISTORY: History   Social History  . Marital Status: Married    Spouse Name: Casimiro Needle  . Number of Children: 2  . Years of Education: 16   Occupational History  . 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 Smoking  status: Former Smoker -- 2.00 packs/day for 35 years    Types: Cigarettes    Quit date: 04/05/2007  . Smokeless tobacco: Never Used  . Alcohol Use: No     Comment: very rare  . Drug Use: No  . Sexual Activity:    Partners: Male   Other Topics Concern  . Not on file   Social History Narrative   Patient is right handed,reside in home with husband      PHYSICAL EXAM  Filed Vitals:   08/31/14 1428  BP: 136/81  Pulse: 62  Height: 5\' 6"  (1.676 m)  Weight: 247 lb (112.038 kg)   Body mass index is 39.89 kg/(m^2).  Generalized: Well developed, in no acute distress  Neurological examination  Mentation: Alert oriented to time, place, history taking. Follows all commands speech and language fluent Cranial nerve II-XII: Pupils were equal round reactive to light. Extraocular movements were full, visual field were full on confrontational test. Facial sensation and strength were normal. Uvula tongue midline. Head turning and shoulder shrug  were normal and symmetric. Motor: The motor testing reveals 5 over 5 strength of all 4 extremities. Good symmetric motor tone is noted throughout.  Sensory: Sensory testing is intact to soft touch on all 4 extremities. No evidence of extinction is noted.  Coordination: Cerebellar testing reveals good finger-nose-finger and heel-to-shin bilaterally.  Gait and station: Gait is normal. Tandem gait is slightly unsteady. Romberg is negative. No drift is seen.  Reflexes: Deep tendon reflexes are symmetric and normal bilaterally.  Marland Kitchen   DIAGNOSTIC DATA (LABS, IMAGING, TESTING) - I reviewed patient records, labs, notes, testing and imaging myself where available.  Lab Results  Component Value Date   WBC 9.5 04/01/2014   HGB 14.4 04/01/2014   HCT 41.1 04/01/2014   MCV 87 04/01/2014   PLT 168 04/01/2014      Component Value Date/Time   NA 137 05/13/2014 0840   NA 135 04/01/2014 1637   K 4.9 05/13/2014 0840   CL 101 05/13/2014 0840   CO2 27  05/13/2014 0840   GLUCOSE 102* 05/13/2014 0840   GLUCOSE 91 04/01/2014 1637   BUN 19 05/13/2014 0840   BUN 22 04/01/2014 1637   CREATININE 1.04 05/13/2014 0840   CREATININE 1.4* 04/27/2014 1639   CALCIUM 9.3 05/13/2014 0840   PROT 6.4 05/13/2014 0840   PROT 6.8 04/01/2014 1637   ALBUMIN 3.5 05/13/2014 0840   AST 22 05/13/2014 0840   ALT 23 05/13/2014 0840   ALKPHOS 60 05/13/2014 0840   BILITOT 0.6 05/13/2014 0840   GFRNONAA 57* 05/13/2014 0840   GFRNONAA 58* 04/01/2014 1637   GFRAA 66 05/13/2014 0840   GFRAA 66 04/01/2014 1637   Lab Results  Component Value Date   CHOL 156 07/08/2013   HDL 46.80 07/08/2013   LDLCALC 88 07/08/2013   TRIG 104.0 07/08/2013   CHOLHDL 3 07/08/2013   Lab Results  Component Value Date   HGBA1C 5.7 01/25/2012   Lab Results  Component Value Date   VITAMINB12 648 03/16/2010   Lab Results  Component Value Date   TSH 1.44 07/06/2014      ASSESSMENT AND PLAN 64 y.o. year old female  has a past medical history of MS (multiple sclerosis); Obesity; Allergic rhinitis; Stomach ulcer from aspirin/ibuprofen-like drugs (NSAID's); Colon polyps; COPD (chronic obstructive pulmonary disease); Hypothyroidism; Chronic hoarseness; Hypertension; and Dyslipidemia. here with:  1. Multiple sclerosis  Overall the patient is doing well. She will continue Betaseron. The patient does state that she will retire in a couple months and her insurance will change to Medicare. She is concerned about the coverage of Betaseron. I have advised the patient to check into the cost of Betaseron on this insurance plan. She then can call her assistance program that she is currently on with Betaseron and see if they will cover the changes. I have advised her that she has any questions regarding this she can call and Shanda Bumps our pharmacy tech can assist her. She will have a physical in one week with her PCP and will have blood work completed then. I have advised that if her symptoms  worsen or she develops any new symptoms she should let us know. Otherwise she'll follow-up in  6 months or sooner if needed.   Butch Penny, MSN, NP-C 08/31/2014, 2:24 PM [facility] [address] [place], [place] [address] [phone]  Note: This document was prepared with digital dictation and possible smart phrase technology. Any transcriptional errors that result from this process are unintentional.

## 2014-09-07 ENCOUNTER — Encounter: Payer: Self-pay | Admitting: Family Medicine

## 2014-09-07 ENCOUNTER — Ambulatory Visit (INDEPENDENT_AMBULATORY_CARE_PROVIDER_SITE_OTHER): Payer: Managed Care, Other (non HMO) | Admitting: Family Medicine

## 2014-09-07 VITALS — BP 124/80 | HR 60 | Temp 98.6°F | Ht 66.0 in | Wt 251.0 lb

## 2014-09-07 DIAGNOSIS — Z0000 Encounter for general adult medical examination without abnormal findings: Secondary | ICD-10-CM | POA: Diagnosis not present

## 2014-09-07 DIAGNOSIS — F419 Anxiety disorder, unspecified: Secondary | ICD-10-CM | POA: Diagnosis not present

## 2014-09-07 DIAGNOSIS — E039 Hypothyroidism, unspecified: Secondary | ICD-10-CM

## 2014-09-07 DIAGNOSIS — R739 Hyperglycemia, unspecified: Secondary | ICD-10-CM

## 2014-09-07 DIAGNOSIS — E785 Hyperlipidemia, unspecified: Secondary | ICD-10-CM | POA: Diagnosis not present

## 2014-09-07 DIAGNOSIS — Z8679 Personal history of other diseases of the circulatory system: Secondary | ICD-10-CM

## 2014-09-07 DIAGNOSIS — I10 Essential (primary) hypertension: Secondary | ICD-10-CM

## 2014-09-07 MED ORDER — ATORVASTATIN CALCIUM 20 MG PO TABS: ORAL_TABLET | ORAL | Status: DC

## 2014-09-07 MED ORDER — LOSARTAN POTASSIUM-HCTZ 100-12.5 MG PO TABS: 1.0000 | ORAL_TABLET | Freq: Every day | ORAL | Status: DC

## 2014-09-07 MED ORDER — FLUOXETINE HCL 40 MG PO CAPS: 40.0000 mg | ORAL_CAPSULE | Freq: Every day | ORAL | Status: DC

## 2014-09-07 MED ORDER — LEVOTHYROXINE SODIUM 112 MCG PO TABS: 112.0000 ug | ORAL_TABLET | Freq: Every day | ORAL | Status: DC

## 2014-09-07 NOTE — Progress Notes (Signed)
Pre visit review using our clinic review tool, if applicable. No additional management support is needed unless otherwise documented below in the visit note. 

## 2014-09-07 NOTE — Patient Instructions (Signed)
Preventive Care for Adults A healthy lifestyle and preventive care can promote health and wellness. Preventive health guidelines for women include the following key practices.  A routine yearly physical is a good way to check with your health care provider about your health and preventive screening. It is a chance to share any concerns and updates on your health and to receive a thorough exam.  Visit your dentist for a routine exam and preventive care every 6 months. Brush your teeth twice a day and floss once a day. Good oral hygiene prevents tooth decay and gum disease.  The frequency of eye exams is based on your age, health, family medical history, use of contact lenses, and other factors. Follow your health care provider's recommendations for frequency of eye exams.  Eat a healthy diet. Foods like vegetables, fruits, whole grains, low-fat dairy products, and lean protein foods contain the nutrients you need without too many calories. Decrease your intake of foods high in solid fats, added sugars, and salt. Eat the right amount of calories for you.Get information about a proper diet from your health care provider, if necessary.  Regular physical exercise is one of the most important things you can do for your health. Most adults should get at least 150 minutes of moderate-intensity exercise (any activity that increases your heart rate and causes you to sweat) each week. In addition, most adults need muscle-strengthening exercises on 2 or more days a week.  Maintain a healthy weight. The body mass index (BMI) is a screening tool to identify possible weight problems. It provides an estimate of body fat based on height and weight. Your health care provider can find your BMI and can help you achieve or maintain a healthy weight.For adults 20 years and older:  A BMI below 18.5 is considered underweight.  A BMI of 18.5 to 24.9 is normal.  A BMI of 25 to 29.9 is considered overweight.  A BMI of  30 and above is considered obese.  Maintain normal blood lipids and cholesterol levels by exercising and minimizing your intake of saturated fat. Eat a balanced diet with plenty of fruit and vegetables. Blood tests for lipids and cholesterol should begin at age 76 and be repeated every 5 years. If your lipid or cholesterol levels are high, you are over 50, or you are at high risk for heart disease, you may need your cholesterol levels checked more frequently.Ongoing high lipid and cholesterol levels should be treated with medicines if diet and exercise are not working.  If you smoke, find out from your health care provider how to quit. If you do not use tobacco, do not start.  Lung cancer screening is recommended for adults aged 22-80 years who are at high risk for developing lung cancer because of a history of smoking. A yearly low-dose CT scan of the lungs is recommended for people who have at least a 30-pack-year history of smoking and are a current smoker or have quit within the past 15 years. A pack year of smoking is smoking an average of 1 pack of cigarettes a day for 1 year (for example: 1 pack a day for 30 years or 2 packs a day for 15 years). Yearly screening should continue until the smoker has stopped smoking for at least 15 years. Yearly screening should be stopped for people who develop a health problem that would prevent them from having lung cancer treatment.  If you are pregnant, do not drink alcohol. If you are breastfeeding,  be very cautious about drinking alcohol. If you are not pregnant and choose to drink alcohol, do not have more than 1 drink per day. One drink is considered to be 12 ounces (355 mL) of beer, 5 ounces (148 mL) of wine, or 1.5 ounces (44 mL) of liquor.  Avoid use of street drugs. Do not share needles with anyone. Ask for help if you need support or instructions about stopping the use of drugs.  High blood pressure causes heart disease and increases the risk of  stroke. Your blood pressure should be checked at least every 1 to 2 years. Ongoing high blood pressure should be treated with medicines if weight loss and exercise do not work.  If you are 75-52 years old, ask your health care provider if you should take aspirin to prevent strokes.  Diabetes screening involves taking a blood sample to check your fasting blood sugar level. This should be done once every 3 years, after age 15, if you are within normal weight and without risk factors for diabetes. Testing should be considered at a younger age or be carried out more frequently if you are overweight and have at least 1 risk factor for diabetes.  Breast cancer screening is essential preventive care for women. You should practice "breast self-awareness." This means understanding the normal appearance and feel of your breasts and may include breast self-examination. Any changes detected, no matter how small, should be reported to a health care provider. Women in their 58s and 30s should have a clinical breast exam (CBE) by a health care provider as part of a regular health exam every 1 to 3 years. After age 16, women should have a CBE every year. Starting at age 53, women should consider having a mammogram (breast X-ray test) every year. Women who have a family history of breast cancer should talk to their health care provider about genetic screening. Women at a high risk of breast cancer should talk to their health care providers about having an MRI and a mammogram every year.  Breast cancer gene (BRCA)-related cancer risk assessment is recommended for women who have family members with BRCA-related cancers. BRCA-related cancers include breast, ovarian, tubal, and peritoneal cancers. Having family members with these cancers may be associated with an increased risk for harmful changes (mutations) in the breast cancer genes BRCA1 and BRCA2. Results of the assessment will determine the need for genetic counseling and  BRCA1 and BRCA2 testing.  Routine pelvic exams to screen for cancer are no longer recommended for nonpregnant women who are considered low risk for cancer of the pelvic organs (ovaries, uterus, and vagina) and who do not have symptoms. Ask your health care provider if a screening pelvic exam is right for you.  If you have had past treatment for cervical cancer or a condition that could lead to cancer, you need Pap tests and screening for cancer for at least 20 years after your treatment. If Pap tests have been discontinued, your risk factors (such as having a new sexual partner) need to be reassessed to determine if screening should be resumed. Some women have medical problems that increase the chance of getting cervical cancer. In these cases, your health care provider may recommend more frequent screening and Pap tests.  The HPV test is an additional test that may be used for cervical cancer screening. The HPV test looks for the virus that can cause the cell changes on the cervix. The cells collected during the Pap test can be  tested for HPV. The HPV test could be used to screen women aged 30 years and older, and should be used in women of any age who have unclear Pap test results. After the age of 30, women should have HPV testing at the same frequency as a Pap test.  Colorectal cancer can be detected and often prevented. Most routine colorectal cancer screening begins at the age of 50 years and continues through age 75 years. However, your health care provider may recommend screening at an earlier age if you have risk factors for colon cancer. On a yearly basis, your health care provider may provide home test kits to check for hidden blood in the stool. Use of a small camera at the end of a tube, to directly examine the colon (sigmoidoscopy or colonoscopy), can detect the earliest forms of colorectal cancer. Talk to your health care provider about this at age 50, when routine screening begins. Direct  exam of the colon should be repeated every 5-10 years through age 75 years, unless early forms of pre-cancerous polyps or small growths are found.  People who are at an increased risk for hepatitis B should be screened for this virus. You are considered at high risk for hepatitis B if:  You were born in a country where hepatitis B occurs often. Talk with your health care provider about which countries are considered high risk.  Your parents were born in a high-risk country and you have not received a shot to protect against hepatitis B (hepatitis B vaccine).  You have HIV or AIDS.  You use needles to inject street drugs.  You live with, or have sex with, someone who has hepatitis B.  You get hemodialysis treatment.  You take certain medicines for conditions like cancer, organ transplantation, and autoimmune conditions.  Hepatitis C blood testing is recommended for all people born from 1945 through 1965 and any individual with known risks for hepatitis C.  Practice safe sex. Use condoms and avoid high-risk sexual practices to reduce the spread of sexually transmitted infections (STIs). STIs include gonorrhea, chlamydia, syphilis, trichomonas, herpes, HPV, and human immunodeficiency virus (HIV). Herpes, HIV, and HPV are viral illnesses that have no cure. They can result in disability, cancer, and death.  You should be screened for sexually transmitted illnesses (STIs) including gonorrhea and chlamydia if:  You are sexually active and are younger than 24 years.  You are older than 24 years and your health care provider tells you that you are at risk for this type of infection.  Your sexual activity has changed since you were last screened and you are at an increased risk for chlamydia or gonorrhea. Ask your health care provider if you are at risk.  If you are at risk of being infected with HIV, it is recommended that you take a prescription medicine daily to prevent HIV infection. This is  called preexposure prophylaxis (PrEP). You are considered at risk if:  You are a heterosexual woman, are sexually active, and are at increased risk for HIV infection.  You take drugs by injection.  You are sexually active with a partner who has HIV.  Talk with your health care provider about whether you are at high risk of being infected with HIV. If you choose to begin PrEP, you should first be tested for HIV. You should then be tested every 3 months for as long as you are taking PrEP.  Osteoporosis is a disease in which the bones lose minerals and strength   with aging. This can result in serious bone fractures or breaks. The risk of osteoporosis can be identified using a bone density scan. Women ages 65 years and over and women at risk for fractures or osteoporosis should discuss screening with their health care providers. Ask your health care provider whether you should take a calcium supplement or vitamin D to reduce the rate of osteoporosis.  Menopause can be associated with physical symptoms and risks. Hormone replacement therapy is available to decrease symptoms and risks. You should talk to your health care provider about whether hormone replacement therapy is right for you.  Use sunscreen. Apply sunscreen liberally and repeatedly throughout the day. You should seek shade when your shadow is shorter than you. Protect yourself by wearing long sleeves, pants, a wide-brimmed hat, and sunglasses year round, whenever you are outdoors.  Once a month, do a whole body skin exam, using a mirror to look at the skin on your back. Tell your health care provider of new moles, moles that have irregular borders, moles that are larger than a pencil eraser, or moles that have changed in shape or color.  Stay current with required vaccines (immunizations).  Influenza vaccine. All adults should be immunized every year.  Tetanus, diphtheria, and acellular pertussis (Td, Tdap) vaccine. Pregnant women should  receive 1 dose of Tdap vaccine during each pregnancy. The dose should be obtained regardless of the length of time since the last dose. Immunization is preferred during the 27th-36th week of gestation. An adult who has not previously received Tdap or who does not know her vaccine status should receive 1 dose of Tdap. This initial dose should be followed by tetanus and diphtheria toxoids (Td) booster doses every 10 years. Adults with an unknown or incomplete history of completing a 3-dose immunization series with Td-containing vaccines should begin or complete a primary immunization series including a Tdap dose. Adults should receive a Td booster every 10 years.  Varicella vaccine. An adult without evidence of immunity to varicella should receive 2 doses or a second dose if she has previously received 1 dose. Pregnant females who do not have evidence of immunity should receive the first dose after pregnancy. This first dose should be obtained before leaving the health care facility. The second dose should be obtained 4-8 weeks after the first dose.  Human papillomavirus (HPV) vaccine. Females aged 13-26 years who have not received the vaccine previously should obtain the 3-dose series. The vaccine is not recommended for use in pregnant females. However, pregnancy testing is not needed before receiving a dose. If a female is found to be pregnant after receiving a dose, no treatment is needed. In that case, the remaining doses should be delayed until after the pregnancy. Immunization is recommended for any person with an immunocompromised condition through the age of 26 years if she did not get any or all doses earlier. During the 3-dose series, the second dose should be obtained 4-8 weeks after the first dose. The third dose should be obtained 24 weeks after the first dose and 16 weeks after the second dose.  Zoster vaccine. One dose is recommended for adults aged 60 years or older unless certain conditions are  present.  Measles, mumps, and rubella (MMR) vaccine. Adults born before 1957 generally are considered immune to measles and mumps. Adults born in 1957 or later should have 1 or more doses of MMR vaccine unless there is a contraindication to the vaccine or there is laboratory evidence of immunity to   each of the three diseases. A routine second dose of MMR vaccine should be obtained at least 28 days after the first dose for students attending postsecondary schools, health care workers, or international travelers. People who received inactivated measles vaccine or an unknown type of measles vaccine during 1963-1967 should receive 2 doses of MMR vaccine. People who received inactivated mumps vaccine or an unknown type of mumps vaccine before 1979 and are at high risk for mumps infection should consider immunization with 2 doses of MMR vaccine. For females of childbearing age, rubella immunity should be determined. If there is no evidence of immunity, females who are not pregnant should be vaccinated. If there is no evidence of immunity, females who are pregnant should delay immunization until after pregnancy. Unvaccinated health care workers born before 1957 who lack laboratory evidence of measles, mumps, or rubella immunity or laboratory confirmation of disease should consider measles and mumps immunization with 2 doses of MMR vaccine or rubella immunization with 1 dose of MMR vaccine.  Pneumococcal 13-valent conjugate (PCV13) vaccine. When indicated, a person who is uncertain of her immunization history and has no record of immunization should receive the PCV13 vaccine. An adult aged 19 years or older who has certain medical conditions and has not been previously immunized should receive 1 dose of PCV13 vaccine. This PCV13 should be followed with a dose of pneumococcal polysaccharide (PPSV23) vaccine. The PPSV23 vaccine dose should be obtained at least 8 weeks after the dose of PCV13 vaccine. An adult aged 19  years or older who has certain medical conditions and previously received 1 or more doses of PPSV23 vaccine should receive 1 dose of PCV13. The PCV13 vaccine dose should be obtained 1 or more years after the last PPSV23 vaccine dose.  Pneumococcal polysaccharide (PPSV23) vaccine. When PCV13 is also indicated, PCV13 should be obtained first. All adults aged 65 years and older should be immunized. An adult younger than age 65 years who has certain medical conditions should be immunized. Any person who resides in a nursing home or long-term care facility should be immunized. An adult smoker should be immunized. People with an immunocompromised condition and certain other conditions should receive both PCV13 and PPSV23 vaccines. People with human immunodeficiency virus (HIV) infection should be immunized as soon as possible after diagnosis. Immunization during chemotherapy or radiation therapy should be avoided. Routine use of PPSV23 vaccine is not recommended for American Indians, [place] Natives, or people younger than 65 years unless there are medical conditions that require PPSV23 vaccine. When indicated, people who have unknown immunization and have no record of immunization should receive PPSV23 vaccine. One-time revaccination 5 years after the first dose of PPSV23 is recommended for people aged 19-64 years who have chronic kidney failure, nephrotic syndrome, asplenia, or immunocompromised conditions. People who received 1-2 doses of PPSV23 before age 65 years should receive another dose of PPSV23 vaccine at age 65 years or later if at least 5 years have passed since the previous dose. Doses of PPSV23 are not needed for people immunized with PPSV23 at or after age 65 years.  Meningococcal vaccine. Adults with asplenia or persistent complement component deficiencies should receive 2 doses of quadrivalent meningococcal conjugate (MenACWY-D) vaccine. The doses should be obtained at least 2 months apart.  Microbiologists working with certain meningococcal bacteria, military recruits, people at risk during an outbreak, and people who travel to or live in countries with a high rate of meningitis should be immunized. A first-year college student up through age   21 years who is living in a residence hall should receive a dose if she did not receive a dose on or after her 16th birthday. Adults who have certain high-risk conditions should receive one or more doses of vaccine.  Hepatitis A vaccine. Adults who wish to be protected from this disease, have certain high-risk conditions, work with hepatitis A-infected animals, work in hepatitis A research labs, or travel to or work in countries with a high rate of hepatitis A should be immunized. Adults who were previously unvaccinated and who anticipate close contact with an international adoptee during the first 60 days after arrival in the [place] from a country with a high rate of hepatitis A should be immunized.  Hepatitis B vaccine. Adults who wish to be protected from this disease, have certain high-risk conditions, may be exposed to blood or other infectious body fluids, are household contacts or sex partners of hepatitis B positive people, are clients or workers in certain care facilities, or travel to or work in countries with a high rate of hepatitis B should be immunized.  Haemophilus influenzae type b (Hib) vaccine. A previously unvaccinated person with asplenia or sickle cell disease or having a scheduled splenectomy should receive 1 dose of Hib vaccine. Regardless of previous immunization, a recipient of a hematopoietic stem cell transplant should receive a 3-dose series 6-12 months after her successful transplant. Hib vaccine is not recommended for adults with HIV infection. Preventive Services / Frequency Ages 64 to 68 years  Blood pressure check.** / Every 1 to 2 years.  Lipid and cholesterol check.** / Every 5 years beginning at age  22.  Clinical breast exam.** / Every 3 years for women in their 88s and 53s.  BRCA-related cancer risk assessment.** / For women who have family members with a BRCA-related cancer (breast, ovarian, tubal, or peritoneal cancers).  Pap test.** / Every 2 years from ages 90 through 51. Every 3 years starting at age 21 through age 56 or 3 with a history of 3 consecutive normal Pap tests.  HPV screening.** / Every 3 years from ages 24 through ages 1 to 46 with a history of 3 consecutive normal Pap tests.  Hepatitis C blood test.** / For any individual with known risks for hepatitis C.  Skin self-exam. / Monthly.  Influenza vaccine. / Every year.  Tetanus, diphtheria, and acellular pertussis (Tdap, Td) vaccine.** / Consult your health care provider. Pregnant women should receive 1 dose of Tdap vaccine during each pregnancy. 1 dose of Td every 10 years.  Varicella vaccine.** / Consult your health care provider. Pregnant females who do not have evidence of immunity should receive the first dose after pregnancy.  HPV vaccine. / 3 doses over 6 months, if 72 and younger. The vaccine is not recommended for use in pregnant females. However, pregnancy testing is not needed before receiving a dose.  Measles, mumps, rubella (MMR) vaccine.** / You need at least 1 dose of MMR if you were born in 1957 or later. You may also need a 2nd dose. For females of childbearing age, rubella immunity should be determined. If there is no evidence of immunity, females who are not pregnant should be vaccinated. If there is no evidence of immunity, females who are pregnant should delay immunization until after pregnancy.  Pneumococcal 13-valent conjugate (PCV13) vaccine.** / Consult your health care provider.  Pneumococcal polysaccharide (PPSV23) vaccine.** / 1 to 2 doses if you smoke cigarettes or if you have certain conditions.  Meningococcal vaccine.** /  1 dose if you are age 19 to 21 years and a first-year college  student living in a residence hall, or have one of several medical conditions, you need to get vaccinated against meningococcal disease. You may also need additional booster doses.  Hepatitis A vaccine.** / Consult your health care provider.  Hepatitis B vaccine.** / Consult your health care provider.  Haemophilus influenzae type b (Hib) vaccine.** / Consult your health care provider. Ages 40 to 64 years  Blood pressure check.** / Every 1 to 2 years.  Lipid and cholesterol check.** / Every 5 years beginning at age 20 years.  Lung cancer screening. / Every year if you are aged 55-80 years and have a 30-pack-year history of smoking and currently smoke or have quit within the past 15 years. Yearly screening is stopped once you have quit smoking for at least 15 years or develop a health problem that would prevent you from having lung cancer treatment.  Clinical breast exam.** / Every year after age 40 years.  BRCA-related cancer risk assessment.** / For women who have family members with a BRCA-related cancer (breast, ovarian, tubal, or peritoneal cancers).  Mammogram.** / Every year beginning at age 40 years and continuing for as long as you are in good health. Consult with your health care provider.  Pap test.** / Every 3 years starting at age 30 years through age 65 or 70 years with a history of 3 consecutive normal Pap tests.  HPV screening.** / Every 3 years from ages 30 years through ages 65 to 70 years with a history of 3 consecutive normal Pap tests.  Fecal occult blood test (FOBT) of stool. / Every year beginning at age 50 years and continuing until age 75 years. You may not need to do this test if you get a colonoscopy every 10 years.  Flexible sigmoidoscopy or colonoscopy.** / Every 5 years for a flexible sigmoidoscopy or every 10 years for a colonoscopy beginning at age 50 years and continuing until age 75 years.  Hepatitis C blood test.** / For all people born from 1945 through  1965 and any individual with known risks for hepatitis C.  Skin self-exam. / Monthly.  Influenza vaccine. / Every year.  Tetanus, diphtheria, and acellular pertussis (Tdap/Td) vaccine.** / Consult your health care provider. Pregnant women should receive 1 dose of Tdap vaccine during each pregnancy. 1 dose of Td every 10 years.  Varicella vaccine.** / Consult your health care provider. Pregnant females who do not have evidence of immunity should receive the first dose after pregnancy.  Zoster vaccine.** / 1 dose for adults aged 60 years or older.  Measles, mumps, rubella (MMR) vaccine.** / You need at least 1 dose of MMR if you were born in 1957 or later. You may also need a 2nd dose. For females of childbearing age, rubella immunity should be determined. If there is no evidence of immunity, females who are not pregnant should be vaccinated. If there is no evidence of immunity, females who are pregnant should delay immunization until after pregnancy.  Pneumococcal 13-valent conjugate (PCV13) vaccine.** / Consult your health care provider.  Pneumococcal polysaccharide (PPSV23) vaccine.** / 1 to 2 doses if you smoke cigarettes or if you have certain conditions.  Meningococcal vaccine.** / Consult your health care provider.  Hepatitis A vaccine.** / Consult your health care provider.  Hepatitis B vaccine.** / Consult your health care provider.  Haemophilus influenzae type b (Hib) vaccine.** / Consult your health care provider. Ages 65   years and over  Blood pressure check.** / Every 1 to 2 years.  Lipid and cholesterol check.** / Every 5 years beginning at age 22 years.  Lung cancer screening. / Every year if you are aged 73-80 years and have a 30-pack-year history of smoking and currently smoke or have quit within the past 15 years. Yearly screening is stopped once you have quit smoking for at least 15 years or develop a health problem that would prevent you from having lung cancer  treatment.  Clinical breast exam.** / Every year after age 4 years.  BRCA-related cancer risk assessment.** / For women who have family members with a BRCA-related cancer (breast, ovarian, tubal, or peritoneal cancers).  Mammogram.** / Every year beginning at age 40 years and continuing for as long as you are in good health. Consult with your health care provider.  Pap test.** / Every 3 years starting at age 9 years through age 34 or 91 years with 3 consecutive normal Pap tests. Testing can be stopped between 65 and 70 years with 3 consecutive normal Pap tests and no abnormal Pap or HPV tests in the past 10 years.  HPV screening.** / Every 3 years from ages 57 years through ages 64 or 45 years with a history of 3 consecutive normal Pap tests. Testing can be stopped between 65 and 70 years with 3 consecutive normal Pap tests and no abnormal Pap or HPV tests in the past 10 years.  Fecal occult blood test (FOBT) of stool. / Every year beginning at age 15 years and continuing until age 17 years. You may not need to do this test if you get a colonoscopy every 10 years.  Flexible sigmoidoscopy or colonoscopy.** / Every 5 years for a flexible sigmoidoscopy or every 10 years for a colonoscopy beginning at age 86 years and continuing until age 71 years.  Hepatitis C blood test.** / For all people born from [birth year] through 1966 and any individual with known risks for hepatitis C.  Osteoporosis screening.** / A one-time screening for women ages 83 years and over and women at risk for fractures or osteoporosis.  Skin self-exam. / Monthly.  Influenza vaccine. / Every year.  Tetanus, diphtheria, and acellular pertussis (Tdap/Td) vaccine.** / 1 dose of Td every 10 years.  Varicella vaccine.** / Consult your health care provider.  Zoster vaccine.** / 1 dose for adults aged 61 years or older.  Pneumococcal 13-valent conjugate (PCV13) vaccine.** / Consult your health care provider.  Pneumococcal  polysaccharide (PPSV23) vaccine.** / 1 dose for all adults aged 28 years and older.  Meningococcal vaccine.** / Consult your health care provider.  Hepatitis A vaccine.** / Consult your health care provider.  Hepatitis B vaccine.** / Consult your health care provider.  Haemophilus influenzae type b (Hib) vaccine.** / Consult your health care provider. ** Family history and personal history of risk and conditions may change your health care provider's recommendations. Document Released: 07/18/2001 Document Revised: 10/06/2013 Document Reviewed: 10/17/2010 [facility] Patient Information 2015 [place], [place]. This information is not intended to replace advice given to you by your health care provider. Make sure you discuss any questions you have with your health care provider.

## 2014-09-07 NOTE — Progress Notes (Signed)
Subjective:     Cassandra Rios is a 64 y.o. female and is here for a comprehensive physical exam. The patient reports no new problems--- seeing Dr Nelva Bush for back pain.  History   Social History  . Marital Status: Married    Spouse Name: Legrand Como  . Number of Children: 2  . Years of Education: 16   Occupational History  . medical claims management  [employer]  . [place] History Main Topics  . Smoking status: Former Smoker -- 2.00 packs/day for 35 years    Types: Cigarettes    Quit date: 04/05/2007  . Smokeless tobacco: Never Used  . Alcohol Use: No     Comment: very rare  . Drug Use: No  . Sexual Activity:    Partners: Male   Other Topics Concern  . Not on file   Social History Narrative   Patient is right handed,reside in home with husband   Health Maintenance  Topic Date Due  . HIV Screening  09/07/2015 (Originally 02/02/1966)  . INFLUENZA VACCINE  01/04/2015  . MAMMOGRAM  03/11/2016  . COLONOSCOPY  05/18/2017  . TETANUS/TDAP  03/06/2021  . ZOSTAVAX  Completed    The following portions of the patient's history were reviewed and updated as appropriate:  She  has a past medical history of MS (multiple sclerosis); Obesity; Allergic rhinitis; Stomach ulcer from aspirin/ibuprofen-like drugs (NSAID's); Colon polyps; COPD (chronic obstructive pulmonary disease); Hypothyroidism; Chronic hoarseness; Hypertension; and Dyslipidemia. She  does not have any pertinent problems on file. She  has past surgical history that includes Cholecystectomy; Carpal tunnel release; Bariatric Surgery (09/07/08); Hiatal hernia repair (09/07/08); Abdominal hysterectomy; and Cholecystectomy. Her family history includes Arthritis in her sister; COPD in her father and sister; Heart disease in her father and mother; Stroke (age of onset: 65) in her mother. She  reports that she quit smoking about 7 years ago. Her smoking use included Cigarettes. She has a 70 pack-year  smoking history. She has never used smokeless tobacco. She reports that she does not drink alcohol or use illicit drugs. She has a current medication list which includes the following prescription(s): aspirin, atorvastatin, benzonatate, calcium carb-cholecalciferol, celecoxib, fluoxetine, fluticasone-salmeterol, hydrocodone-acetaminophen, interferon beta-1b, levothyroxine, loratadine, losartan-hydrochlorothiazide, mometasone, multivitamin, tramadol, and tapentadol. Current Outpatient Prescriptions on File Prior to Visit  Medication Sig Dispense Refill  . aspirin 81 MG tablet Take 81 mg by mouth daily.    . benzonatate (TESSALON) 200 MG capsule Take 1 capsule (200 mg total) by mouth 3 (three) times daily as needed for cough. 60 capsule 0  . Calcium Carbonate-Vitamin D (CALCIUM 600 + D PO) Take by mouth daily.      . celecoxib (CELEBREX) 200 MG capsule Take 200 mg by mouth daily as needed.    . fluticasone-salmeterol (ADVAIR HFA) 115-21 MCG/ACT inhaler Inhale 2 puffs into the lungs as needed.    Marland Kitchen HYDROcodone-acetaminophen (NORCO/VICODIN) 5-325 MG per tablet Take 1 tablet by mouth every 6 (six) hours as needed for moderate pain.    . Interferon Beta-1b (BETASERON) 0.3 MG KIT injection Inject 0.25 mg into the skin every other day. 1 kit 6  . mometasone (NASONEX) 50 MCG/ACT nasal spray Place 2 sprays into the nose daily. 51 g 3  . Multiple Vitamin (MULTIVITAMIN) tablet Take 1 tablet by mouth 2 (two) times daily.      . traMADol (ULTRAM) 50 MG tablet 1 tab po q 8 hrs prn pain 90 tablet 0  .  tapentadol (NUCYNTA) 50 MG TABS tablet Take 50 mg by mouth 3 (three) times daily as needed.     No current facility-administered medications on file prior to visit.   She is allergic to codeine; hydromorphone hcl; ibuprofen; and morphine sulfate..  Review of Systems Review of Systems  Constitutional: Negative for activity change, appetite change and fatigue.  HENT: Negative for hearing loss, congestion, tinnitus  and ear discharge.  dentist q33mEyes: Negative for visual disturbance (see optho q1y -- vision corrected to 20/20 with glasses).  Respiratory: Negative for cough, chest tightness and shortness of breath.   Cardiovascular: Negative for chest pain, palpitations and leg swelling.  Gastrointestinal: Negative for abdominal pain, diarrhea, constipation and abdominal distention.  Genitourinary: Negative for urgency, frequency, decreased urine volume and difficulty urinating.  Musculoskeletal: Negative for back pain, arthralgias and gait problem.  Skin: Negative for color change, pallor and rash.  Neurological: Negative for dizziness, light-headedness, numbness and headaches.  Hematological: Negative for adenopathy. Does not bruise/bleed easily.  Psychiatric/Behavioral: Negative for suicidal ideas, confusion, sleep disturbance, self-injury, dysphoric mood, decreased concentration and agitation.       Objective:    BP 124/80 mmHg  Pulse 60  Temp(Src) 98.6 F (37 C) (Oral)  Ht 5' 6"  (1.676 m)  Wt 251 lb (113.853 kg)  BMI 40.53 kg/m2  SpO2 98% General appearance: alert, cooperative, appears stated age and no distress Head: Normocephalic, without obvious abnormality, atraumatic Eyes: conjunctivae/corneas clear. PERRL, EOM's intact. Fundi benign. Ears: normal TM's and external ear canals both ears Nose: Nares normal. Septum midline. Mucosa normal. No drainage or sinus tenderness. Throat: lips, mucosa, and tongue normal; teeth and gums normal Neck: no adenopathy, no carotid bruit, no JVD, supple, symmetrical, trachea midline and thyroid not enlarged, symmetric, no tenderness/mass/nodules Back: symmetric, no curvature. ROM normal. No CVA tenderness. Lungs: clear to auscultation bilaterally Breasts: normal appearance, no masses or tenderness Heart: regular rate and rhythm, S1, S2 normal, no murmur, click, rub or gallop Abdomen: soft, non-tender; bowel sounds normal; no masses,  no  organomegaly Pelvic: not indicated; post-menopausal, no abnormal Pap smears in past Extremities: extremities normal, atraumatic, no cyanosis or edema Pulses: 2+ and symmetric Skin: Skin color, texture, turgor normal. No rashes or lesions Lymph nodes: Cervical, supraclavicular, and axillary nodes normal. Neurologic: Alert and oriented X 3, normal strength and tone. Normal symmetric reflexes. Normal coordination and gait Psych--       Assessment:    Healthy female exam.      Plan:    ghm utd Check labs See After Visit Summary for Counseling Recommendations    1. Anxiety   - FLUoxetine (PROZAC) 40 MG capsule; Take 1 capsule (40 mg total) by mouth daily.  Dispense: 90 capsule; Refill: 1  2. Hyperlipidemia   - atorvastatin (LIPITOR) 20 MG tablet; TAKE 1 TABLET (20 MG TOTAL) BY MOUTH DAILY.  Dispense: 90 tablet; Refill: 1 - Basic metabolic panel; Future - Hepatic function panel; Future - Lipid panel; Future  3. Hypothyroidism, unspecified hypothyroidism type   - levothyroxine (SYNTHROID, LEVOTHROID) 112 MCG tablet; Take 1 tablet (112 mcg total) by mouth daily.  Dispense: 90 tablet; Refill: 1 - Basic metabolic panel; Future - CBC with Differential/Platelet; Future - POCT urinalysis dipstick; Future  4. Essential hypertension   - losartan-hydrochlorothiazide (HYZAAR) 100-12.5 MG per tablet; Take 1 tablet by mouth daily.  Dispense: 90 tablet; Refill: 1 - Basic metabolic panel; Future - CBC with Differential/Platelet; Future - POCT urinalysis dipstick; Future - Microalbumin / creatinine urine  ratio; Future  5. Hyperglycemia   - Hemoglobin A1c; Future  6. History of abdominal aortic aneurysm   - [name] [employer]; Future  7. Preventative health care

## 2014-09-12 ENCOUNTER — Inpatient Hospital Stay (HOSPITAL_BASED_OUTPATIENT_CLINIC_OR_DEPARTMENT_OTHER): Admission: RE | Admit: 2014-09-12 | Payer: Managed Care, Other (non HMO) | Source: Ambulatory Visit

## 2014-09-15 ENCOUNTER — Ambulatory Visit (HOSPITAL_BASED_OUTPATIENT_CLINIC_OR_DEPARTMENT_OTHER)
Admission: RE | Admit: 2014-09-15 | Discharge: 2014-09-15 | Disposition: A | Discharge status: Home / Self Care | Payer: Managed Care, Other (non HMO) | Source: Ambulatory Visit | Attending: Family Medicine | Admitting: Family Medicine

## 2014-09-15 ENCOUNTER — Other Ambulatory Visit (INDEPENDENT_AMBULATORY_CARE_PROVIDER_SITE_OTHER): Payer: Managed Care, Other (non HMO)

## 2014-09-15 DIAGNOSIS — E785 Hyperlipidemia, unspecified: Secondary | ICD-10-CM

## 2014-09-15 DIAGNOSIS — E039 Hypothyroidism, unspecified: Secondary | ICD-10-CM

## 2014-09-15 DIAGNOSIS — I10 Essential (primary) hypertension: Secondary | ICD-10-CM | POA: Diagnosis not present

## 2014-09-15 DIAGNOSIS — R809 Proteinuria, unspecified: Secondary | ICD-10-CM

## 2014-09-15 DIAGNOSIS — I714 Abdominal aortic aneurysm, without rupture: Secondary | ICD-10-CM | POA: Insufficient documentation

## 2014-09-15 DIAGNOSIS — R8290 Unspecified abnormal findings in urine: Secondary | ICD-10-CM

## 2014-09-15 DIAGNOSIS — R739 Hyperglycemia, unspecified: Secondary | ICD-10-CM

## 2014-09-15 LAB — POCT URINALYSIS DIPSTICK
Bilirubin, UA: NEGATIVE
Blood, UA: NEGATIVE
GLUCOSE UA: NEGATIVE
Ketones, UA: NEGATIVE
LEUKOCYTES UA: NEGATIVE
Nitrite, UA: NEGATIVE
Protein, UA: NEGATIVE
Spec Grav, UA: 1.015
UROBILINOGEN UA: 0.2
pH, UA: 6

## 2014-09-15 LAB — BASIC METABOLIC PANEL
BUN: 18 mg/dL (ref 6–23)
CHLORIDE: 104 meq/L (ref 96–112)
CO2: 30 mEq/L (ref 19–32)
Calcium: 9.6 mg/dL (ref 8.4–10.5)
Creatinine, Ser: 1.27 mg/dL — ABNORMAL HIGH (ref 0.40–1.20)
GFR: 44.94 mL/min — ABNORMAL LOW (ref >60.00)
Glucose, Bld: 103 mg/dL — ABNORMAL HIGH (ref 70–99)
POTASSIUM: 4.2 meq/L (ref 3.5–5.1)
Sodium: 138 mEq/L (ref 135–145)

## 2014-09-15 LAB — LIPID PANEL
CHOL/HDL RATIO: 3
Cholesterol: 157 mg/dL (ref 0–200)
HDL: 49.7 mg/dL (ref >39.00)
LDL Cholesterol: 83 mg/dL (ref 0–99)
NONHDL: 107.3
Triglycerides: 120 mg/dL (ref 0.0–149.0)
VLDL: 24 mg/dL (ref 0.0–40.0)

## 2014-09-15 LAB — CBC WITH DIFFERENTIAL/PLATELET
BASOS PCT: 0.3 % (ref 0.0–3.0)
Basophils Absolute: 0 10*3/uL (ref 0.0–0.1)
EOS PCT: 1.7 % (ref 0.0–5.0)
Eosinophils Absolute: 0.1 10*3/uL (ref 0.0–0.7)
HEMATOCRIT: 42.1 % (ref 36.0–46.0)
HEMOGLOBIN: 14.2 g/dL (ref 12.0–15.0)
LYMPHS ABS: 1.6 10*3/uL (ref 0.7–4.0)
LYMPHS PCT: 22.1 % (ref 12.0–46.0)
MCHC: 33.6 g/dL (ref 30.0–36.0)
MCV: 88.7 fl (ref 78.0–100.0)
MONOS PCT: 9.6 % (ref 3.0–12.0)
Monocytes Absolute: 0.7 10*3/uL (ref 0.1–1.0)
NEUTROS ABS: 4.8 10*3/uL (ref 1.4–7.7)
Neutrophils Relative %: 66.3 % (ref 43.0–77.0)
Platelets: 117 10*3/uL — ABNORMAL LOW (ref 150.0–400.0)
RBC: 4.75 Mil/uL (ref 3.87–5.11)
RDW: 12.9 % (ref 11.5–15.5)
WBC: 7.3 10*3/uL (ref 4.0–10.5)

## 2014-09-15 LAB — MICROALBUMIN / CREATININE URINE RATIO
Creatinine,U: 131 mg/dL
MICROALB UR: 2.5 mg/dL — AB (ref 0.0–1.9)
Microalb Creat Ratio: 1.9 mg/g (ref 0.0–30.0)

## 2014-09-15 LAB — HEPATIC FUNCTION PANEL
ALT: 18 U/L (ref 0–35)
AST: 21 U/L (ref 0–37)
Albumin: 3.8 g/dL (ref 3.5–5.2)
Alkaline Phosphatase: 69 U/L (ref 39–117)
Bilirubin, Direct: 0.1 mg/dL (ref 0.0–0.3)
TOTAL PROTEIN: 7 g/dL (ref 6.0–8.3)
Total Bilirubin: 0.4 mg/dL (ref 0.2–1.2)

## 2014-09-15 LAB — HEMOGLOBIN A1C: HEMOGLOBIN A1C: 5.7 % (ref 4.6–6.5)

## 2014-09-15 NOTE — Addendum Note (Signed)
Addended by: Peggyann Shoals on: 09/15/2014 09:34 AM   Modules accepted: Orders

## 2014-09-16 DIAGNOSIS — R809 Proteinuria, unspecified: Secondary | ICD-10-CM

## 2014-09-17 LAB — URINE CULTURE

## 2014-09-22 NOTE — Addendum Note (Signed)
Addended by: Modena Morrow D on: 09/22/2014 09:07 AM   Modules accepted: Orders

## 2014-09-23 ENCOUNTER — Telehealth: Payer: Self-pay | Admitting: Neurology

## 2014-09-23 DIAGNOSIS — G35 Multiple sclerosis: Secondary | ICD-10-CM

## 2014-09-23 MED ORDER — INTERFERON BETA-1B 0.3 MG ~~LOC~~ KIT: 0.2500 mg | PACK | SUBCUTANEOUS | Status: DC

## 2014-09-23 NOTE — Telephone Encounter (Signed)
[facility] is calling requesting a refill for Interferon Beta-1b (BETASERON) 0.3 MG KIT injection when calling please use Ref# [id number].

## 2014-09-23 NOTE — Telephone Encounter (Signed)
I called back, spoke with Elmyra Ricks.  Verified Rx.  They will process order.

## 2014-09-24 LAB — PROTEIN, URINE, 24 HOUR
PROTEIN, URINE: 6 mg/dL (ref 5–24)
Protein, 24H Urine: 54 mg/d (ref <150)

## 2014-09-26 ENCOUNTER — Other Ambulatory Visit: Payer: Self-pay | Admitting: Family Medicine

## 2014-11-24 ENCOUNTER — Telehealth: Payer: Self-pay | Admitting: Family Medicine

## 2014-11-24 DIAGNOSIS — D696 Thrombocytopenia, unspecified: Secondary | ICD-10-CM

## 2014-11-24 DIAGNOSIS — R7989 Other specified abnormal findings of blood chemistry: Secondary | ICD-10-CM

## 2014-11-24 NOTE — Telephone Encounter (Signed)
Overall ok----microalbuminuria---need 24 h urine for protein Cr elevated---kidney function--- Increase fluids  Platelets are low----recheck 3 months----cbcd, bmp   orders in.       KP

## 2014-11-24 NOTE — Telephone Encounter (Addendum)
Relation to pt: self Call back number:[phone]   Reason for call:  Pt scheduled lab appointment for July 12th pt states at last OV on 09/07/14 MD requested patient to check labs in 3 months. Pt wanted to know what type of labs will be drawn. Please advise

## 2014-12-15 ENCOUNTER — Other Ambulatory Visit (INDEPENDENT_AMBULATORY_CARE_PROVIDER_SITE_OTHER): Payer: Managed Care, Other (non HMO)

## 2014-12-15 DIAGNOSIS — R7989 Other specified abnormal findings of blood chemistry: Secondary | ICD-10-CM

## 2014-12-15 DIAGNOSIS — D696 Thrombocytopenia, unspecified: Secondary | ICD-10-CM

## 2014-12-15 DIAGNOSIS — R748 Abnormal levels of other serum enzymes: Secondary | ICD-10-CM

## 2014-12-15 LAB — CBC WITH DIFFERENTIAL/PLATELET
Basophils Absolute: 0 10*3/uL (ref 0.0–0.1)
Basophils Relative: 0.4 % (ref 0.0–3.0)
Eosinophils Absolute: 0.2 10*3/uL (ref 0.0–0.7)
Eosinophils Relative: 2.6 % (ref 0.0–5.0)
HEMATOCRIT: 40.7 % (ref 36.0–46.0)
HEMOGLOBIN: 13.8 g/dL (ref 12.0–15.0)
LYMPHS PCT: 27.3 % (ref 12.0–46.0)
Lymphs Abs: 2.1 10*3/uL (ref 0.7–4.0)
MCHC: 33.8 g/dL (ref 30.0–36.0)
MCV: 88 fl (ref 78.0–100.0)
MONO ABS: 0.6 10*3/uL (ref 0.1–1.0)
Monocytes Relative: 8.1 % (ref 3.0–12.0)
Neutro Abs: 4.7 10*3/uL (ref 1.4–7.7)
Neutrophils Relative %: 61.6 % (ref 43.0–77.0)
Platelets: 121 10*3/uL — ABNORMAL LOW (ref 150.0–400.0)
RBC: 4.63 Mil/uL (ref 3.87–5.11)
RDW: 13.3 % (ref 11.5–15.5)
WBC: 7.7 10*3/uL (ref 4.0–10.5)

## 2014-12-15 LAB — BASIC METABOLIC PANEL
BUN: 16 mg/dL (ref 6–23)
CO2: 29 mEq/L (ref 19–32)
Calcium: 9.6 mg/dL (ref 8.4–10.5)
Chloride: 102 mEq/L (ref 96–112)
Creatinine, Ser: 1 mg/dL (ref 0.40–1.20)
GFR: 59.17 mL/min — ABNORMAL LOW (ref >60.00)
Glucose, Bld: 97 mg/dL (ref 70–99)
Potassium: 4.3 mEq/L (ref 3.5–5.1)
Sodium: 137 mEq/L (ref 135–145)

## 2014-12-29 ENCOUNTER — Other Ambulatory Visit: Payer: Self-pay | Admitting: Family Medicine

## 2015-01-07 ENCOUNTER — Telehealth: Payer: Self-pay | Admitting: Adult Health

## 2015-01-07 NOTE — Telephone Encounter (Signed)
I called back.  Patient has changed ins, but does not have her new Ins info yet.  She is interested in trying to obtain co-pay assist.  Patient will contact [facility] at [phone].  If they are not able to assist her, they should be able to refer her to a program that offers grants to those who qualify.  She will all [name] back if anything further is needed.

## 2015-01-07 NOTE — Telephone Encounter (Signed)
Pt called and would like to talk about medication assistance . Please call and advise [phone]

## 2015-02-19 ENCOUNTER — Other Ambulatory Visit: Payer: Self-pay

## 2015-02-19 DIAGNOSIS — Z1231 Encounter for screening mammogram for malignant neoplasm of breast: Secondary | ICD-10-CM

## 2015-03-08 ENCOUNTER — Encounter: Payer: Self-pay | Admitting: Adult Health

## 2015-03-08 ENCOUNTER — Ambulatory Visit (INDEPENDENT_AMBULATORY_CARE_PROVIDER_SITE_OTHER): Payer: PPO | Admitting: Adult Health

## 2015-03-08 VITALS — BP 139/77 | HR 61 | Ht 66.0 in | Wt 241.0 lb

## 2015-03-08 DIAGNOSIS — G35 Multiple sclerosis: Secondary | ICD-10-CM

## 2015-03-08 NOTE — Progress Notes (Signed)
PATIENT: Cassandra Rios DOB: November 04, 1950  REASON FOR VISIT: follow up- multiple sclerosis HISTORY FROM: patient  HISTORY OF PRESENT ILLNESS: Cassandra Rios is a 64 year old female with a history of multiple sclerosis. She returns today for follow-up. The patient continues to take Betaseron and tolerates it well. The patient denies any new numbness or weakness. Denies any changes with her gait or balance. No changes with her bowels or bladder. Again she reports that she only has constipation when taking her hydrocodone. She [place]. She states that overall she is doing well. The patient states that she recently had blood work in July with her primary care provider. She states that her platelets was low. However she states that this has been an ongoing issue. She was told by Dr. Manus Gunning that because she was on Betaseron her blood needed to be "rocked" not "spinned" by the lab. He told her that if it was spinned the Betaseron causes the blood to clot together and results in an inaccurate result for platelets. Denies any new neurological symptoms. She returns today for an evaluation.  HISTORY 08/31/14: Cassandra Rios is a 63 year old female with a history of obesity and multiple sclerosis. She returns today for follow-up. The patient is currently taking Betaseron and tolerating it well. At the last visit the patient had an MRI of the brain that showed no significant changes. The patient denies any new numbness or weakness. She denies any changes with her gait or balance. Denies any falls. No changes with the bowels or bladder. Patient states she does have some constipation although it occurs only when she takes her hydrocodone. No changes with her vision. She does state that she is having a gritty sensation in her eyes as well as sinus headaches. She has an appointment with her ophthalmologist in 2 weeks. She denies any new neurological symptoms. Denies any new  medical issues.  REVIEW OF SYSTEMS: Out of a complete 14 system review of symptoms, the patient complains only of the following symptoms, and all other reviewed systems are negative.  Fatigue, fever, runny nose, cough, frequent waking, daytime sleepiness, joint pain, back pain, headache, weakness, environmental allergies  ALLERGIES: Allergies  Allergen Reactions  . Codeine   . Hydromorphone Hcl     REACTION: nausea/vomiting  . Ibuprofen     REACTION: stomach ulcers  . Morphine Sulfate     HOME MEDICATIONS: Outpatient Prescriptions Prior to Visit  Medication Sig Dispense Refill  . aspirin 81 MG tablet Take 81 mg by mouth daily.    Marland Kitchen atorvastatin (LIPITOR) 20 MG tablet TAKE 1 TABLET DAILY 90 tablet 1  . benzonatate (TESSALON) 200 MG capsule Take 1 capsule (200 mg total) by mouth 3 (three) times daily as needed for cough. 60 capsule 0  . Calcium Carbonate-Vitamin D (CALCIUM 600 + D PO) Take by mouth daily.      . celecoxib (CELEBREX) 200 MG capsule Take 200 mg by mouth daily as needed.    Marland Kitchen FLUoxetine (PROZAC) 40 MG capsule Take 1 capsule (40 mg total) by mouth daily. 90 capsule 1  . fluticasone-salmeterol (ADVAIR HFA) 115-21 MCG/ACT inhaler Inhale 2 puffs into the lungs as needed.    Marland Kitchen HYDROcodone-acetaminophen (NORCO/VICODIN) 5-325 MG per tablet Take 1 tablet by mouth every 6 (six) hours as needed for moderate pain.    . Interferon Beta-1b (BETASERON) 0.3 MG KIT injection Inject 0.25 mg into the skin every other day. 1 kit 6  .  levothyroxine (SYNTHROID, LEVOTHROID) 112 MCG tablet Take 1 tablet (112 mcg total) by mouth daily. 90 tablet 1  . loratadine (CLARITIN) 10 MG tablet Take 10 mg by mouth daily.    Marland Kitchen losartan-hydrochlorothiazide (HYZAAR) 100-12.5 MG per tablet Take 1 tablet by mouth daily. 90 tablet 1  . mometasone (NASONEX) 50 MCG/ACT nasal spray Place 2 sprays into the nose daily. 51 g 3  . Multiple Vitamin (MULTIVITAMIN) tablet Take 1 tablet by mouth 2 (two) times daily.        . traMADol (ULTRAM) 50 MG tablet 1 tab po q 8 hrs prn pain 90 tablet 0  . losartan-hydrochlorothiazide (HYZAAR) 100-12.5 MG per tablet TAKE 1 TABLET BY MOUTH EVERY DAY 90 tablet 1  . tapentadol (NUCYNTA) 50 MG TABS tablet Take 50 mg by mouth 3 (three) times daily as needed.     No facility-administered medications prior to visit.    PAST MEDICAL HISTORY: Past Medical History  Diagnosis Date  . MS (multiple sclerosis) (HCC)   . Obesity   . Allergic rhinitis   . Stomach ulcer from aspirin/ibuprofen-like drugs (NSAID's)   . Colon polyps   . COPD (chronic obstructive pulmonary disease) (HCC)     Dr. Marchelle Gearing  . Hypothyroidism   . Chronic hoarseness   . Hypertension   . Dyslipidemia     PAST SURGICAL HISTORY: Past Surgical History  Procedure Laterality Date  . Cholecystectomy    . Carpal tunnel release    . Bariatric surgery  09/07/08    lap band  . Hiatal hernia repair  09/07/08  . Abdominal hysterectomy    . Cholecystectomy      FAMILY HISTORY: Family History  Problem Relation Age of Onset  . COPD Father   . Heart disease Father   . COPD Sister   . Arthritis Sister   . Stroke Mother 39  . Heart disease Mother     congenital heart defect?    SOCIAL HISTORY: Social History   Social History  . Marital Status: Married    Spouse Name: Casimiro Needle  . Number of Children: 2  . Years of Education: 16   Occupational History  . 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 Smoking status: Former Smoker -- 2.00 packs/day for 35 years    Types: Cigarettes    Quit date: 04/05/2007  . Smokeless tobacco: Never Used  . Alcohol Use: No     Comment: very rare  . Drug Use: No  . Sexual Activity:    Partners: Male   Other Topics Concern  . Not on file   Social History Narrative   Patient is right handed,reside in home with husband      PHYSICAL EXAM  Filed Vitals:   03/08/15 0934  BP: 139/77  Pulse: 61   Height: 5\' 6"  (1.676 m)  Weight: 241 lb (109.317 kg)   Body mass index is 38.92 kg/(m^2).  Generalized: Well developed, in no acute distress   Neurological examination  Mentation: Alert oriented to time, place, history taking. Follows all commands speech and language fluent Cranial nerve II-XII: Pupils were equal round reactive to light. Extraocular movements were full, visual field were full on confrontational test. Facial sensation and strength were normal. Uvula tongue midline. Head turning and shoulder shrug  were normal and symmetric. Motor: The motor testing reveals 5 over 5 strength of all 4 extremities. Good symmetric motor tone is noted throughout.  Sensory:  Sensory testing is intact to soft touch on all 4 extremities. No evidence of extinction is noted.  Coordination: Cerebellar testing reveals good finger-nose-finger and heel-to-shin bilaterally.  Gait and station: Gait is normal. Tandem gait is normal. Romberg is negative. No drift is seen.  Reflexes: Deep tendon reflexes are symmetric and normal bilaterally.   DIAGNOSTIC DATA (LABS, IMAGING, TESTING) - I reviewed patient records, labs, notes, testing and imaging myself where available.  Lab Results  Component Value Date   WBC 7.7 12/15/2014   HGB 13.8 12/15/2014   HCT 40.7 12/15/2014   MCV 88.0 12/15/2014   PLT 121.0 Repeated and verified X2.* 12/15/2014      Component Value Date/Time   NA 137 12/15/2014 0756   NA 135 04/01/2014 1637   K 4.3 12/15/2014 0756   CL 102 12/15/2014 0756   CO2 29 12/15/2014 0756   GLUCOSE 97 12/15/2014 0756   GLUCOSE 91 04/01/2014 1637   BUN 16 12/15/2014 0756   BUN 22 04/01/2014 1637   CREATININE 1.00 12/15/2014 0756   CREATININE 1.04 05/13/2014 0840   CALCIUM 9.6 12/15/2014 0756   PROT 7.0 09/15/2014 0749   PROT 6.8 04/01/2014 1637   ALBUMIN 3.8 09/15/2014 0749   AST 21 09/15/2014 0749   ALT 18 09/15/2014 0749   ALKPHOS 69 09/15/2014 0749   BILITOT 0.4 09/15/2014 0749    GFRNONAA 57* 05/13/2014 0840   GFRNONAA 58* 04/01/2014 1637   GFRAA 66 05/13/2014 0840   GFRAA 66 04/01/2014 1637   ASSESSMENT AND PLAN 64 y.o. year old female  has a past medical history of MS (multiple sclerosis) (HCC); Obesity; Allergic rhinitis; Stomach ulcer from aspirin/ibuprofen-like drugs (NSAID's); Colon polyps; COPD (chronic obstructive pulmonary disease) (HCC); Hypothyroidism; Chronic hoarseness; Hypertension; and Dyslipidemia. here with:  1. Multiple sclerosis  Overall the patient is doing well. She will continue on Betaseron. She denies any new neurological symptoms. Patient advised that if she develops any new symptoms she should let us know. She will follow-up in 6 months with Dr. Anne Hahn.     Butch Penny, MSN, NP-C 03/08/2015, 10:30 AM [facility] [address] [place], [place] [address] [phone]

## 2015-03-08 NOTE — Progress Notes (Signed)
I have reviewed and agreed above plan. 

## 2015-03-08 NOTE — Patient Instructions (Signed)
Continue Betaseron  If your symptoms worsen or you develop new symptoms please let us know.

## 2015-03-12 ENCOUNTER — Ambulatory Visit
Admission: RE | Admit: 2015-03-12 | Discharge: 2015-03-12 | Disposition: A | Discharge status: Home / Self Care | Payer: PPO | Source: Ambulatory Visit

## 2015-03-12 DIAGNOSIS — Z1231 Encounter for screening mammogram for malignant neoplasm of breast: Secondary | ICD-10-CM

## 2015-03-23 ENCOUNTER — Ambulatory Visit (INDEPENDENT_AMBULATORY_CARE_PROVIDER_SITE_OTHER): Payer: PPO | Admitting: Family Medicine

## 2015-03-23 ENCOUNTER — Encounter: Payer: Self-pay | Admitting: Family Medicine

## 2015-03-23 VITALS — BP 136/74 | HR 53 | Temp 97.9°F | Wt 242.4 lb

## 2015-03-23 DIAGNOSIS — E039 Hypothyroidism, unspecified: Secondary | ICD-10-CM

## 2015-03-23 DIAGNOSIS — J209 Acute bronchitis, unspecified: Secondary | ICD-10-CM

## 2015-03-23 DIAGNOSIS — J440 Chronic obstructive pulmonary disease with acute lower respiratory infection: Secondary | ICD-10-CM

## 2015-03-23 DIAGNOSIS — R739 Hyperglycemia, unspecified: Secondary | ICD-10-CM | POA: Diagnosis not present

## 2015-03-23 DIAGNOSIS — E785 Hyperlipidemia, unspecified: Secondary | ICD-10-CM | POA: Diagnosis not present

## 2015-03-23 DIAGNOSIS — F419 Anxiety disorder, unspecified: Secondary | ICD-10-CM | POA: Diagnosis not present

## 2015-03-23 DIAGNOSIS — J019 Acute sinusitis, unspecified: Secondary | ICD-10-CM | POA: Insufficient documentation

## 2015-03-23 DIAGNOSIS — I10 Essential (primary) hypertension: Secondary | ICD-10-CM | POA: Diagnosis not present

## 2015-03-23 DIAGNOSIS — J0110 Acute frontal sinusitis, unspecified: Secondary | ICD-10-CM

## 2015-03-23 DIAGNOSIS — Z1159 Encounter for screening for other viral diseases: Secondary | ICD-10-CM

## 2015-03-23 LAB — LIPID PANEL
CHOL/HDL RATIO: 3
Cholesterol: 162 mg/dL (ref 0–200)
HDL: 50.8 mg/dL (ref >39.00)
LDL Cholesterol: 82 mg/dL (ref 0–99)
NonHDL: 111.16
Triglycerides: 148 mg/dL (ref 0.0–149.0)
VLDL: 29.6 mg/dL (ref 0.0–40.0)

## 2015-03-23 LAB — COMPREHENSIVE METABOLIC PANEL
ALBUMIN: 3.9 g/dL (ref 3.5–5.2)
ALT: 18 U/L (ref 0–35)
AST: 22 U/L (ref 0–37)
Alkaline Phosphatase: 69 U/L (ref 39–117)
BUN: 18 mg/dL (ref 6–23)
CHLORIDE: 101 meq/L (ref 96–112)
CO2: 30 mEq/L (ref 19–32)
Calcium: 10.2 mg/dL (ref 8.4–10.5)
Creatinine, Ser: 1.03 mg/dL (ref 0.40–1.20)
GFR: 57.14 mL/min — ABNORMAL LOW (ref >60.00)
Glucose, Bld: 107 mg/dL — ABNORMAL HIGH (ref 70–99)
Potassium: 4.4 mEq/L (ref 3.5–5.1)
Sodium: 139 mEq/L (ref 135–145)
TOTAL PROTEIN: 7.3 g/dL (ref 6.0–8.3)
Total Bilirubin: 0.5 mg/dL (ref 0.2–1.2)

## 2015-03-23 LAB — TSH: TSH: 2.03 u[IU]/mL (ref 0.35–4.50)

## 2015-03-23 LAB — HEMOGLOBIN A1C: HEMOGLOBIN A1C: 5.8 % (ref 4.6–6.5)

## 2015-03-23 MED ORDER — ATORVASTATIN CALCIUM 20 MG PO TABS: 20.0000 mg | ORAL_TABLET | Freq: Every day | ORAL | Status: DC

## 2015-03-23 MED ORDER — FLUOXETINE HCL 40 MG PO CAPS: 40.0000 mg | ORAL_CAPSULE | Freq: Every day | ORAL | Status: DC

## 2015-03-23 MED ORDER — AMOXICILLIN-POT CLAVULANATE 875-125 MG PO TABS: 1.0000 | ORAL_TABLET | Freq: Two times a day (BID) | ORAL | Status: DC

## 2015-03-23 MED ORDER — LEVOTHYROXINE SODIUM 112 MCG PO TABS: 112.0000 ug | ORAL_TABLET | Freq: Every day | ORAL | Status: DC

## 2015-03-23 MED ORDER — LOSARTAN POTASSIUM-HCTZ 100-12.5 MG PO TABS: 1.0000 | ORAL_TABLET | Freq: Every day | ORAL | Status: DC

## 2015-03-23 NOTE — Assessment & Plan Note (Signed)
con't advair Augmentin Pt has tessalon perles for cough

## 2015-03-23 NOTE — Assessment & Plan Note (Signed)
Augmentin, nasonex and antihistamine Call or rto prn

## 2015-03-23 NOTE — Progress Notes (Signed)
Pre visit review using our clinic review tool, if applicable. No additional management support is needed unless otherwise documented below in the visit note. 

## 2015-03-23 NOTE — Progress Notes (Signed)
Patient ID: Cassandra Rios, female    DOB: [birth year]  Age: 64 y.o. MRN: [id number]    Subjective:  Subjective HPI Cassandra Rios presents for f/u bp, cholesterol and thyroid.  Pt needs refills on her meds as well.  Pt also c/o sinus congestion and cough x 2 weeks.  + sinus drainage x 1 week.   + sinus pressure and ear pressure.  + Otc antihistamine .  No fever but she woke up drenched this am.    Review of Systems  Constitutional: Positive for fever, chills and fatigue.  HENT: Positive for congestion, ear pain, postnasal drip, rhinorrhea, sinus pressure, sneezing and sore throat.   Respiratory: Positive for cough, chest tightness, shortness of breath and wheezing.   Cardiovascular: Negative for chest pain, palpitations and leg swelling.  Allergic/Immunologic: Negative for environmental allergies.  Psychiatric/Behavioral: Negative for decreased concentration. The patient is nervous/anxious.     History Past Medical History  Diagnosis Date  . MS (multiple sclerosis) (HCC)   . Obesity   . Allergic rhinitis   . Stomach ulcer from aspirin/ibuprofen-like drugs (NSAID's)   . Colon polyps   . COPD (chronic obstructive pulmonary disease) (HCC)     Dr. Marchelle Gearing  . Hypothyroidism   . Chronic hoarseness   . Hypertension   . Dyslipidemia     She has past surgical history that includes Cholecystectomy; Carpal tunnel release; Bariatric Surgery (09/07/08); Hiatal hernia repair (09/07/08); Abdominal hysterectomy; and Cholecystectomy.   Her family history includes Arthritis in her sister; COPD in her father and sister; Heart disease in her father and mother; Stroke (age of onset: 82) in her mother.She reports that she quit smoking about 7 years ago. Her smoking use included Cigarettes. She has a 70 pack-year smoking history. She has never used smokeless tobacco. She reports that she does not drink alcohol or use illicit drugs.  Current Outpatient Prescriptions on File Prior to Visit    Medication Sig Dispense Refill  . aspirin 81 MG tablet Take 81 mg by mouth daily.    . benzonatate (TESSALON) 200 MG capsule Take 1 capsule (200 mg total) by mouth 3 (three) times daily as needed for cough. 60 capsule 0  . Calcium Carbonate-Vitamin D (CALCIUM 600 + D PO) Take by mouth daily.      . celecoxib (CELEBREX) 200 MG capsule Take 200 mg by mouth daily as needed.    . fluticasone-salmeterol (ADVAIR HFA) 115-21 MCG/ACT inhaler Inhale 2 puffs into the lungs as needed.    Marland Kitchen HYDROcodone-acetaminophen (NORCO/VICODIN) 5-325 MG per tablet Take 1 tablet by mouth every 6 (six) hours as needed for moderate pain.    . Interferon Beta-1b (BETASERON) 0.3 MG KIT injection Inject 0.25 mg into the skin every other day. 1 kit 6  . loratadine (CLARITIN) 10 MG tablet Take 10 mg by mouth daily.    . mometasone (NASONEX) 50 MCG/ACT nasal spray Place 2 sprays into the nose daily. 51 g 3  . Multiple Vitamin (MULTIVITAMIN) tablet Take 1 tablet by mouth 2 (two) times daily.       No current facility-administered medications on file prior to visit.     Objective:  Objective Physical Exam  Constitutional: She is oriented to person, place, and time. She appears well-developed and well-nourished.  HENT:  Right Ear: External ear normal.  Left Ear: External ear normal.  Nose: Rhinorrhea and sinus tenderness present. Right sinus exhibits maxillary sinus tenderness and frontal sinus tenderness. Left sinus exhibits maxillary sinus  tenderness and frontal sinus tenderness.  + PND + errythema  Eyes: Conjunctivae are normal. Right eye exhibits no discharge. Left eye exhibits no discharge.  Neck: Normal range of motion. Neck supple. No thyromegaly present.  Cardiovascular: Normal rate, regular rhythm and normal heart sounds.   No murmur heard. Pulmonary/Chest: Effort normal. No respiratory distress. She has wheezes. She has no rales. She exhibits no tenderness.  Musculoskeletal: She exhibits no edema.   Lymphadenopathy:    She has cervical adenopathy.  Neurological: She is alert and oriented to person, place, and time.  Psychiatric: She has a normal mood and affect. Her behavior is normal. Judgment and thought content normal.  Nursing note and vitals reviewed.  BP 136/74 mmHg  Pulse 53  Temp(Src) 97.9 F (36.6 C) (Oral)  Wt 242 lb 6.4 oz (109.952 kg)  SpO2 98% Wt Readings from Last 3 Encounters:  03/23/15 242 lb 6.4 oz (109.952 kg)  03/08/15 241 lb (109.317 kg)  09/07/14 251 lb (113.853 kg)     Lab Results  Component Value Date   WBC 7.7 12/15/2014   HGB 13.8 12/15/2014   HCT 40.7 12/15/2014   PLT 121.0 Repeated and verified X2.* 12/15/2014   GLUCOSE 97 12/15/2014   CHOL 157 09/15/2014   TRIG 120.0 09/15/2014   HDL 49.70 09/15/2014   LDLCALC 83 09/15/2014   ALT 18 09/15/2014   AST 21 09/15/2014   NA 137 12/15/2014   K 4.3 12/15/2014   CL 102 12/15/2014   CREATININE 1.00 12/15/2014   BUN 16 12/15/2014   CO2 29 12/15/2014   TSH 1.44 07/06/2014   HGBA1C 5.7 09/15/2014   MICROALBUR 2.5* 09/15/2014    Mm Digital Screening Bilateral  03/15/2015  CLINICAL DATA:  Screening. EXAM: DIGITAL SCREENING BILATERAL MAMMOGRAM WITH CAD COMPARISON:  Previous exam(s). ACR Breast Density Category b: There are scattered areas of fibroglandular density. FINDINGS: There are no findings suspicious for malignancy. Images were processed with CAD. IMPRESSION: No mammographic evidence of malignancy. A result letter of this screening mammogram will be mailed directly to the patient. RECOMMENDATION: Screening mammogram in one year. (Code:SM-B-01Y) BI-RADS CATEGORY  1: Negative. Electronically Signed   By: Frederico Hamman M.D.   On: 03/15/2015 07:46     Assessment & Plan:  Plan I have discontinued Ms. Kapaun's traMADol. I have also changed her losartan-hydrochlorothiazide and atorvastatin. Additionally, I am having her start on amoxicillin-clavulanate. Lastly, I am having her maintain her  multivitamin, Calcium Carbonate-Vitamin D (CALCIUM 600 + D PO), aspirin, celecoxib, HYDROcodone-acetaminophen, benzonatate, mometasone, fluticasone-salmeterol, loratadine, Interferon Beta-1b, levothyroxine, and FLUoxetine.  Meds ordered this encounter  Medications  . losartan-hydrochlorothiazide (HYZAAR) 100-12.5 MG tablet    Sig: Take 1 tablet by mouth daily.    Dispense:  90 tablet    Refill:  1    D/C PREVIOUS SCRIPTS FOR THIS MEDICATION  . levothyroxine (SYNTHROID, LEVOTHROID) 112 MCG tablet    Sig: Take 1 tablet (112 mcg total) by mouth daily.    Dispense:  90 tablet    Refill:  1    D/C PREVIOUS SCRIPTS FOR THIS MEDICATION  . FLUoxetine (PROZAC) 40 MG capsule    Sig: Take 1 capsule (40 mg total) by mouth daily.    Dispense:  90 capsule    Refill:  1    D/C PREVIOUS SCRIPTS FOR THIS MEDICATION  . atorvastatin (LIPITOR) 20 MG tablet    Sig: Take 1 tablet (20 mg total) by mouth daily.    Dispense:  90 tablet  Refill:  1  . amoxicillin-clavulanate (AUGMENTIN) 875-125 MG tablet    Sig: Take 1 tablet by mouth 2 (two) times daily.    Dispense:  20 tablet    Refill:  0    Problem List Items Addressed This Visit    Sinusitis, acute - Primary    Augmentin, nasonex and antihistamine Call or rto prn      Relevant Medications   amoxicillin-clavulanate (AUGMENTIN) 875-125 MG tablet   Hypothyroidism   Relevant Medications   levothyroxine (SYNTHROID, LEVOTHROID) 112 MCG tablet   Other Relevant Orders   TSH   COPD with acute bronchitis (HCC)    con't advair Augmentin Pt has tessalon perles for cough       Other Visit Diagnoses    Essential hypertension        Relevant Medications    losartan-hydrochlorothiazide (HYZAAR) 100-12.5 MG tablet    atorvastatin (LIPITOR) 20 MG tablet    Anxiety        Relevant Medications    FLUoxetine (PROZAC) 40 MG capsule    Hyperlipidemia        Relevant Medications    losartan-hydrochlorothiazide (HYZAAR) 100-12.5 MG tablet     atorvastatin (LIPITOR) 20 MG tablet    Other Relevant Orders    Comp Met (CMET)    Lipid panel    Hyperglycemia        Relevant Orders    Hemoglobin A1c    Need for hepatitis C screening test        Relevant Orders    Hepatitis C antibody       Follow-up: Return in about 6 months (around 09/21/2015), or if symptoms worsen or fail to improve, for annual exam, fasting.  Loreen Freud, DO

## 2015-03-23 NOTE — Patient Instructions (Signed)

## 2015-03-24 LAB — HEPATITIS C ANTIBODY: HCV AB: NEGATIVE

## 2015-04-12 ENCOUNTER — Telehealth: Payer: Self-pay | Admitting: Family Medicine

## 2015-04-12 NOTE — Telephone Encounter (Signed)
Patient called with dates of injections to be placed on her chart  Flu Vac-04/01/2015 Prevnar-13 - 04/01/2015  Both done at [facility]. Call patient if any questions

## 2015-04-12 NOTE — Telephone Encounter (Signed)
Chart updated.   KP 

## 2015-04-28 ENCOUNTER — Other Ambulatory Visit: Payer: Self-pay | Admitting: Family Medicine

## 2015-05-13 NOTE — Telephone Encounter (Signed)
This encounter was created in error - please disregard.

## 2015-06-15 ENCOUNTER — Telehealth: Payer: Self-pay | Admitting: Adult Health

## 2015-06-15 NOTE — Telephone Encounter (Signed)
Ins has been provided with clinical info.  Request is under review Ref # [id number]  Ins has approved the request for coverage on Betaseron effective until 06/15/2016

## 2015-06-15 NOTE — Telephone Encounter (Signed)
[facility] [phone] called regarding PA for Interferon Beta-1b (BETASERON) 0.3 MG KIT injection, states request was faxed over approx 12:50pm today, request that form be filled out and faxed back to [phone].

## 2015-06-16 NOTE — Telephone Encounter (Signed)
Darmyn with betaseron called inquiring about PA. He will call back to check status of PA

## 2015-06-23 NOTE — Telephone Encounter (Signed)
I called back.  Spoke with [place].  Relayed approval info noted below.  She expressed understanding and appreciation.

## 2015-06-23 NOTE — Telephone Encounter (Signed)
Cassandra Rios [phone] called checking status of PA.

## 2015-07-20 DIAGNOSIS — M5136 Other intervertebral disc degeneration, lumbar region: Secondary | ICD-10-CM | POA: Diagnosis not present

## 2015-07-20 DIAGNOSIS — Z79891 Long term (current) use of opiate analgesic: Secondary | ICD-10-CM | POA: Diagnosis not present

## 2015-07-20 DIAGNOSIS — M4806 Spinal stenosis, lumbar region: Secondary | ICD-10-CM | POA: Diagnosis not present

## 2015-09-06 ENCOUNTER — Ambulatory Visit (INDEPENDENT_AMBULATORY_CARE_PROVIDER_SITE_OTHER): Payer: PPO | Admitting: Neurology

## 2015-09-06 ENCOUNTER — Encounter: Payer: Self-pay | Admitting: Neurology

## 2015-09-06 VITALS — BP 131/74 | HR 67 | Ht 66.0 in | Wt 246.0 lb

## 2015-09-06 DIAGNOSIS — G35 Multiple sclerosis: Secondary | ICD-10-CM

## 2015-09-06 NOTE — Progress Notes (Signed)
Reason for visit: Multiple sclerosis  Cassandra Rios is an 65 y.o. female  History of present illness:  Cassandra Rios is a 65 year old right-handed white female with a history of multiple sclerosis, obesity, and tobacco abuse. The patient has retired, but she has remained relatively inactive in part because of chronic low back pain. The patient is on Betaseron, she has done well with this. She has not had any new symptoms related to MS since last seen 6 months ago. The patient has had blood work in the fall of 2016 to include a CBC and a comprehensive metabolic profile. The patient is having some slight worsening of her vision, she has cataracts and she will be seeing her ophthalmologist in the near future. She denies any new numbness, weakness, balance changes, or difficulty controlling the bowels or the bladder. She returns for an evaluation.  Past Medical History  Diagnosis Date  . MS (multiple sclerosis) ([place])   . Obesity   . Allergic rhinitis   . Stomach ulcer from aspirin/ibuprofen-like drugs (NSAID's)   . Colon polyps   . COPD (chronic obstructive pulmonary disease) (HCC)     Dr. Chase Caller  . Hypothyroidism   . Chronic hoarseness   . Hypertension   . Dyslipidemia     Past Surgical History  Procedure Laterality Date  . Cholecystectomy    . Carpal tunnel release    . Bariatric surgery  09/07/08    lap band  . Hiatal hernia repair  09/07/08  . Abdominal hysterectomy    . Cholecystectomy      Family History  Problem Relation Age of Onset  . COPD Father   . Heart disease Father   . COPD Sister   . Arthritis Sister   . Stroke Mother 55  . Heart disease Mother     congenital heart defect?    Social history:  reports that she quit smoking about 8 years ago. Her smoking use included Cigarettes. She has a 70 pack-year smoking history. She has never used smokeless tobacco. She reports that she does not drink alcohol or use illicit drugs.    Allergies  Allergen  Reactions  . Codeine   . Hydromorphone Hcl     REACTION: nausea/vomiting  . Ibuprofen     REACTION: stomach ulcers  . Morphine Sulfate     Medications:  Prior to Admission medications   Medication Sig Start Date End Date Taking? Authorizing Provider  aspirin 81 MG tablet Take 81 mg by mouth daily.   Yes Historical Provider, MD  atorvastatin (LIPITOR) 20 MG tablet Take 1 tablet (20 mg total) by mouth daily. 03/23/15  Yes Yvonne R Lowne Chase, DO  benzonatate (TESSALON) 200 MG capsule Take 1 capsule (200 mg total) by mouth 3 (three) times daily as needed for cough. 03/23/14  Yes Midge Minium, MD  Calcium Carbonate-Vitamin D (CALCIUM 600 + D PO) Take by mouth daily.     Yes Historical Provider, MD  celecoxib (CELEBREX) 200 MG capsule Take 200 mg by mouth daily as needed.   Yes Historical Provider, MD  FLUoxetine (PROZAC) 40 MG capsule Take 1 capsule (40 mg total) by mouth daily. 03/23/15 04/08/16 Yes Yvonne R Lowne Chase, DO  fluticasone-salmeterol (ADVAIR HFA) 756-43 MCG/ACT inhaler Inhale 2 puffs into the lungs as needed. 03/23/14  Yes Midge Minium, MD  HYDROcodone-acetaminophen (NORCO/VICODIN) 5-325 MG per tablet Take 1 tablet by mouth every 6 (six) hours as needed for moderate pain.   Yes  Historical Provider, MD  Interferon Beta-1b (BETASERON) 0.3 MG KIT injection Inject 0.25 mg into the skin every other day. 09/23/14  Yes Kathrynn Ducking, MD  levothyroxine (SYNTHROID, LEVOTHROID) 112 MCG tablet Take 1 tablet (112 mcg total) by mouth daily. 03/23/15  Yes Yvonne R Lowne Chase, DO  loratadine (CLARITIN) 10 MG tablet Take 10 mg by mouth daily.   Yes Historical Provider, MD  losartan-hydrochlorothiazide (HYZAAR) 100-12.5 MG tablet Take 1 tablet by mouth daily. 03/23/15  Yes Yvonne R Lowne Chase, DO  mometasone (NASONEX) 50 MCG/ACT nasal spray Place 2 sprays into the nose daily. 03/23/14  Yes Midge Minium, MD  Multiple Vitamin (MULTIVITAMIN) tablet Take 1 tablet by mouth 2 (two)  times daily.     Yes Historical Provider, MD    ROS:  Out of a complete 14 system review of symptoms, the patient complains only of the following symptoms, and all other reviewed systems are negative.  Decreased activity Eye discharge, eye itching, eye redness, eye pain Wheezing Heat intolerance Constipation Snoring Joint pain, back pain, achy muscles Headache, weakness  Blood pressure 131/74, pulse 67, height 5' 6"  (1.676 m), weight 246 lb (111.585 kg).  Physical Exam  General: The patient is alert and cooperative at the time of the examination. The patient is markedly obese.  Skin: No significant peripheral edema is noted.   Neurologic Exam  Mental status: The patient is alert and oriented x 3 at the time of the examination. The patient has apparent normal recent and remote memory, with an apparently normal attention span and concentration ability.   Cranial nerves: Facial symmetry is present. Speech is normal, no aphasia or dysarthria is noted. Extraocular movements are full. Visual fields are full. Pupils are equal, round, and reactive to light. Discs are flat bilaterally.  Motor: The patient has good strength in all 4 extremities.  Sensory examination: Soft touch sensation is symmetric on the face, arms, and legs.  Coordination: The patient has good finger-nose-finger and heel-to-shin bilaterally.  Gait and station: The patient has a normal gait. Tandem gait is normal. Romberg is negative. No drift is seen.  Reflexes: Deep tendon reflexes are symmetric, but are depressed.   Assessment/Plan:  1. Multiple sclerosis  2. Chronic low back pain  3. Morbid obesity  The patient is doing relatively well with her multiple sclerosis. She last had MRI brain evaluation done in October 2015. We will plan on rechecking another MRI of the brain in the fall of 2018. She will follow-up in one year, sooner if needed.  Jill Alexanders MD 09/06/2015 1:18 PM  [facility] [address] [place], [place] [address]  Phone [phone] Fax [phone]

## 2015-09-17 ENCOUNTER — Other Ambulatory Visit: Payer: Self-pay | Admitting: Family Medicine

## 2015-09-23 ENCOUNTER — Other Ambulatory Visit: Payer: Self-pay | Admitting: Family Medicine

## 2015-09-24 ENCOUNTER — Telehealth: Payer: Self-pay

## 2015-09-24 ENCOUNTER — Ambulatory Visit (INDEPENDENT_AMBULATORY_CARE_PROVIDER_SITE_OTHER): Payer: PPO | Admitting: Family Medicine

## 2015-09-24 ENCOUNTER — Encounter: Payer: Self-pay | Admitting: Family Medicine

## 2015-09-24 VITALS — BP 132/82 | HR 54 | Temp 97.6°F | Ht 66.0 in | Wt 249.4 lb

## 2015-09-24 DIAGNOSIS — J449 Chronic obstructive pulmonary disease, unspecified: Secondary | ICD-10-CM

## 2015-09-24 DIAGNOSIS — F411 Generalized anxiety disorder: Secondary | ICD-10-CM

## 2015-09-24 DIAGNOSIS — J302 Other seasonal allergic rhinitis: Secondary | ICD-10-CM

## 2015-09-24 DIAGNOSIS — E039 Hypothyroidism, unspecified: Secondary | ICD-10-CM | POA: Diagnosis not present

## 2015-09-24 DIAGNOSIS — R739 Hyperglycemia, unspecified: Secondary | ICD-10-CM | POA: Diagnosis not present

## 2015-09-24 DIAGNOSIS — E785 Hyperlipidemia, unspecified: Secondary | ICD-10-CM

## 2015-09-24 DIAGNOSIS — R319 Hematuria, unspecified: Secondary | ICD-10-CM

## 2015-09-24 DIAGNOSIS — IMO0001 Reserved for inherently not codable concepts without codable children: Secondary | ICD-10-CM

## 2015-09-24 DIAGNOSIS — H43813 Vitreous degeneration, bilateral: Secondary | ICD-10-CM | POA: Diagnosis not present

## 2015-09-24 DIAGNOSIS — H25013 Cortical age-related cataract, bilateral: Secondary | ICD-10-CM | POA: Diagnosis not present

## 2015-09-24 DIAGNOSIS — J309 Allergic rhinitis, unspecified: Secondary | ICD-10-CM

## 2015-09-24 DIAGNOSIS — H2513 Age-related nuclear cataract, bilateral: Secondary | ICD-10-CM | POA: Diagnosis not present

## 2015-09-24 DIAGNOSIS — I10 Essential (primary) hypertension: Secondary | ICD-10-CM

## 2015-09-24 DIAGNOSIS — H524 Presbyopia: Secondary | ICD-10-CM | POA: Diagnosis not present

## 2015-09-24 LAB — COMPREHENSIVE METABOLIC PANEL
ALK PHOS: 59 U/L (ref 39–117)
ALT: 21 U/L (ref 0–35)
AST: 25 U/L (ref 0–37)
Albumin: 3.8 g/dL (ref 3.5–5.2)
BUN: 13 mg/dL (ref 6–23)
CO2: 31 meq/L (ref 19–32)
Calcium: 9.8 mg/dL (ref 8.4–10.5)
Chloride: 101 mEq/L (ref 96–112)
Creatinine, Ser: 0.94 mg/dL (ref 0.40–1.20)
GFR: 63.39 mL/min (ref >60.00)
GLUCOSE: 102 mg/dL — AB (ref 70–99)
POTASSIUM: 4.5 meq/L (ref 3.5–5.1)
SODIUM: 138 meq/L (ref 135–145)
TOTAL PROTEIN: 6.8 g/dL (ref 6.0–8.3)
Total Bilirubin: 0.6 mg/dL (ref 0.2–1.2)

## 2015-09-24 LAB — LIPID PANEL
CHOL/HDL RATIO: 4
Cholesterol: 170 mg/dL (ref 0–200)
HDL: 46.8 mg/dL (ref >39.00)
LDL Cholesterol: 96 mg/dL (ref 0–99)
NONHDL: 123.03
TRIGLYCERIDES: 135 mg/dL (ref 0.0–149.0)
VLDL: 27 mg/dL (ref 0.0–40.0)

## 2015-09-24 LAB — CBC WITH DIFFERENTIAL/PLATELET
Basophils Absolute: 0 10*3/uL (ref 0.0–0.1)
Basophils Relative: 0.5 % (ref 0.0–3.0)
EOS PCT: 2 % (ref 0.0–5.0)
Eosinophils Absolute: 0.1 10*3/uL (ref 0.0–0.7)
HCT: 42.1 % (ref 36.0–46.0)
Hemoglobin: 13.9 g/dL (ref 12.0–15.0)
LYMPHS ABS: 1.9 10*3/uL (ref 0.7–4.0)
Lymphocytes Relative: 34.8 % (ref 12.0–46.0)
MCHC: 33 g/dL (ref 30.0–36.0)
MCV: 88.7 fl (ref 78.0–100.0)
Monocytes Absolute: 0.6 10*3/uL (ref 0.1–1.0)
Monocytes Relative: 10 % (ref 3.0–12.0)
NEUTROS ABS: 2.9 10*3/uL (ref 1.4–7.7)
NEUTROS PCT: 52.7 % (ref 43.0–77.0)
Platelets: 127 10*3/uL — ABNORMAL LOW (ref 150.0–400.0)
RBC: 4.75 Mil/uL (ref 3.87–5.11)
RDW: 13.4 % (ref 11.5–15.5)
WBC: 5.6 10*3/uL (ref 4.0–10.5)

## 2015-09-24 LAB — POCT URINALYSIS DIPSTICK
BILIRUBIN UA: NEGATIVE
Glucose, UA: NEGATIVE
KETONES UA: NEGATIVE
Nitrite, UA: NEGATIVE
PH UA: 7
Protein, UA: NEGATIVE
RBC UA: NEGATIVE
Spec Grav, UA: 1.02
Urobilinogen, UA: 0.2

## 2015-09-24 LAB — HEMOGLOBIN A1C: HEMOGLOBIN A1C: 5.8 % (ref 4.6–6.5)

## 2015-09-24 LAB — TSH: TSH: 6.41 u[IU]/mL — ABNORMAL HIGH (ref 0.35–4.50)

## 2015-09-24 MED ORDER — FLUTICASONE-SALMETEROL 115-21 MCG/ACT IN AERO: 2.0000 | INHALATION_SPRAY | RESPIRATORY_TRACT | Status: DC | PRN

## 2015-09-24 MED ORDER — LEVOCETIRIZINE DIHYDROCHLORIDE 5 MG PO TABS: 5.0000 mg | ORAL_TABLET | Freq: Every evening | ORAL | Status: DC

## 2015-09-24 MED ORDER — MOMETASONE FUROATE 50 MCG/ACT NA SUSP: 2.0000 | Freq: Every day | NASAL | Status: DC

## 2015-09-24 MED ORDER — LEVOTHYROXINE SODIUM 112 MCG PO TABS: 112.0000 ug | ORAL_TABLET | Freq: Every day | ORAL | Status: DC

## 2015-09-24 MED ORDER — ATORVASTATIN CALCIUM 20 MG PO TABS: ORAL_TABLET | ORAL | Status: DC

## 2015-09-24 MED ORDER — LOSARTAN POTASSIUM-HCTZ 100-12.5 MG PO TABS: 1.0000 | ORAL_TABLET | Freq: Every day | ORAL | Status: DC

## 2015-09-24 MED ORDER — FLUOXETINE HCL 40 MG PO CAPS: ORAL_CAPSULE | ORAL | Status: DC

## 2015-09-24 NOTE — Addendum Note (Signed)
Addended by: Caffie Pinto on: 09/24/2015 11:09 AM   Modules accepted: Orders

## 2015-09-24 NOTE — Addendum Note (Signed)
Addended by: Ewing Schlein on: 09/24/2015 09:46 AM   Modules accepted: Orders

## 2015-09-24 NOTE — Telephone Encounter (Signed)
Error.     KP 

## 2015-09-24 NOTE — Assessment & Plan Note (Signed)
Stable con't losartan 

## 2015-09-24 NOTE — Progress Notes (Signed)
Patient ID: Cassandra Rios, female    DOB: May 31, 1951  Age: 65 y.o. MRN: [id number]    Subjective:  Subjective HPI Cassandra Rios presents for f/u bp and cholesterol and c/o inc allergy symptoms   Review of Systems  Constitutional: Negative for diaphoresis, appetite change, fatigue and unexpected weight change.  Eyes: Negative for pain, redness and visual disturbance.  Respiratory: Negative for cough, chest tightness, shortness of breath and wheezing.   Cardiovascular: Negative for chest pain, palpitations and leg swelling.  Endocrine: Negative for cold intolerance, heat intolerance, polydipsia, polyphagia and polyuria.  Genitourinary: Negative for dysuria, frequency and difficulty urinating.  Neurological: Negative for dizziness, light-headedness, numbness and headaches.    History Past Medical History  Diagnosis Date  . MS (multiple sclerosis) (HCC)   . Obesity   . Allergic rhinitis   . Stomach ulcer from aspirin/ibuprofen-like drugs (NSAID's)   . Colon polyps   . COPD (chronic obstructive pulmonary disease) (HCC)     Dr. Marchelle Gearing  . Hypothyroidism   . Chronic hoarseness   . Hypertension   . Dyslipidemia     She has past surgical history that includes Cholecystectomy; Carpal tunnel release; Bariatric Surgery (09/07/08); Hiatal hernia repair (09/07/08); Abdominal hysterectomy; and Cholecystectomy.   Her family history includes Arthritis in her sister; COPD in her father and sister; Heart disease in her father and mother; Stroke (age of onset: 34) in her mother.She reports that she quit smoking about 8 years ago. Her smoking use included Cigarettes. She has a 70 pack-year smoking history. She has never used smokeless tobacco. She reports that she does not drink alcohol or use illicit drugs.  Current Outpatient Prescriptions on File Prior to Visit  Medication Sig Dispense Refill  . aspirin 81 MG tablet Take 81 mg by mouth daily.    . benzonatate (TESSALON) 200 MG capsule  Take 1 capsule (200 mg total) by mouth 3 (three) times daily as needed for cough. 60 capsule 0  . Calcium Carbonate-Vitamin D (CALCIUM 600 + D PO) Take by mouth daily.      . celecoxib (CELEBREX) 200 MG capsule Take 200 mg by mouth daily as needed.    Marland Kitchen HYDROcodone-acetaminophen (NORCO/VICODIN) 5-325 MG per tablet Take 1 tablet by mouth every 6 (six) hours as needed for moderate pain.    . Interferon Beta-1b (BETASERON) 0.3 MG KIT injection Inject 0.25 mg into the skin every other day. 1 kit 6  . Multiple Vitamin (MULTIVITAMIN) tablet Take 1 tablet by mouth 2 (two) times daily.       No current facility-administered medications on file prior to visit.     Objective:  Objective Physical Exam  Constitutional: She is oriented to person, place, and time. She appears well-developed and well-nourished.  HENT:  Head: Normocephalic and atraumatic.  Right Ear: External ear normal.  Left Ear: External ear normal.  Nose: Nose normal.  Mouth/Throat: Oropharynx is clear and moist.  Eyes: Conjunctivae and EOM are normal.  Neck: Normal range of motion. Neck supple. No JVD present. Carotid bruit is not present. No thyromegaly present.  Cardiovascular: Normal rate, regular rhythm and normal heart sounds.   No murmur heard. Pulmonary/Chest: Effort normal and breath sounds normal. No respiratory distress. She has no wheezes. She has no rales. She exhibits no tenderness.  Musculoskeletal: She exhibits no edema.  Neurological: She is alert and oriented to person, place, and time.  Psychiatric: She has a normal mood and affect.  Nursing note and vitals reviewed.  BP 132/82 mmHg  Pulse 54  Temp(Src) 97.6 F (36.4 C) (Oral)  Ht 5\' 6"  (1.676 m)  Wt 249 lb 6.4 oz (113.127 kg)  BMI 40.27 kg/m2  SpO2 98% Wt Readings from Last 3 Encounters:  09/24/15 249 lb 6.4 oz (113.127 kg)  09/06/15 246 lb (111.585 kg)  03/23/15 242 lb 6.4 oz (109.952 kg)     Lab Results  Component Value Date   WBC 7.7  12/15/2014   HGB 13.8 12/15/2014   HCT 40.7 12/15/2014   PLT 121.0 Repeated and verified X2.* 12/15/2014   GLUCOSE 107* 03/23/2015   CHOL 162 03/23/2015   TRIG 148.0 03/23/2015   HDL 50.80 03/23/2015   LDLCALC 82 03/23/2015   ALT 18 03/23/2015   AST 22 03/23/2015   NA 139 03/23/2015   K 4.4 03/23/2015   CL 101 03/23/2015   CREATININE 1.03 03/23/2015   BUN 18 03/23/2015   CO2 30 03/23/2015   TSH 2.03 03/23/2015   HGBA1C 5.8 03/23/2015   MICROALBUR 2.5* 09/15/2014    Mm Digital Screening Bilateral  03/15/2015  CLINICAL DATA:  Screening. EXAM: DIGITAL SCREENING BILATERAL MAMMOGRAM WITH CAD COMPARISON:  Previous exam(s). ACR Breast Density Category b: There are scattered areas of fibroglandular density. FINDINGS: There are no findings suspicious for malignancy. Images were processed with CAD. IMPRESSION: No mammographic evidence of malignancy. A result letter of this screening mammogram will be mailed directly to the patient. RECOMMENDATION: Screening mammogram in one year. (Code:SM-B-01Y) BI-RADS CATEGORY  1: Negative. Electronically Signed   By: Frederico Hamman M.D.   On: 03/15/2015 07:46     Assessment & Plan:  Plan I have discontinued Cassandra Rios's loratadine. I have also changed her levothyroxine. Additionally, I am having her start on levocetirizine. Lastly, I am having her maintain her multivitamin, Calcium Carbonate-Vitamin D (CALCIUM 600 + D PO), aspirin, celecoxib, HYDROcodone-acetaminophen, benzonatate, Interferon Beta-1b, fluticasone-salmeterol, mometasone, losartan-hydrochlorothiazide, FLUoxetine, and atorvastatin.  Meds ordered this encounter  Medications  . DISCONTD: mometasone (NASONEX) 50 MCG/ACT nasal spray    Sig: Place 2 sprays into the nose daily.    Dispense:  51 g    Refill:  3  . fluticasone-salmeterol (ADVAIR HFA) 115-21 MCG/ACT inhaler    Sig: Inhale 2 puffs into the lungs as needed.    Dispense:  1 Inhaler    Refill:  11  . levocetirizine (XYZAL) 5 MG  tablet    Sig: Take 1 tablet (5 mg total) by mouth every evening.    Dispense:  30 tablet    Refill:  5  . mometasone (NASONEX) 50 MCG/ACT nasal spray    Sig: Place 2 sprays into the nose daily.    Dispense:  51 g    Refill:  3  . losartan-hydrochlorothiazide (HYZAAR) 100-12.5 MG tablet    Sig: Take 1 tablet by mouth daily.    Dispense:  90 tablet    Refill:  1  . levothyroxine (SYNTHROID, LEVOTHROID) 112 MCG tablet    Sig: Take 1 tablet (112 mcg total) by mouth daily.    Dispense:  90 tablet    Refill:  1  . FLUoxetine (PROZAC) 40 MG capsule    Sig: TAKE 1 CAPSULE (40 MG TOTAL) BY MOUTH DAILY.    Dispense:  90 capsule    Refill:  1  . atorvastatin (LIPITOR) 20 MG tablet    Sig: TAKE 1 TABLET (20 MG TOTAL) BY MOUTH DAILY.    Dispense:  90 tablet    Refill:  1  Problem List Items Addressed This Visit      Unprioritized   Allergic rhinitis    Change med to xyzal daily con't nasonex rto prn      HTN (hypertension)    Stable con't losartan      Relevant Medications   losartan-hydrochlorothiazide (HYZAAR) 100-12.5 MG tablet   atorvastatin (LIPITOR) 20 MG tablet   Other Relevant Orders   Comprehensive metabolic panel   CBC with Differential/Platelet   POCT urinalysis dipstick   Lipid panel    Other Visit Diagnoses    COPD bronchitis    -  Primary    Relevant Medications    fluticasone-salmeterol (ADVAIR HFA) 115-21 MCG/ACT inhaler    levocetirizine (XYZAL) 5 MG tablet    mometasone (NASONEX) 50 MCG/ACT nasal spray    Other Relevant Orders    POCT urinalysis dipstick    Seasonal allergies        Relevant Medications    levocetirizine (XYZAL) 5 MG tablet    mometasone (NASONEX) 50 MCG/ACT nasal spray    Hypothyroidism, unspecified hypothyroidism type        Relevant Medications    levothyroxine (SYNTHROID, LEVOTHROID) 112 MCG tablet    Other Relevant Orders    TSH    POCT urinalysis dipstick    Hyperlipidemia        Relevant Medications     losartan-hydrochlorothiazide (HYZAAR) 100-12.5 MG tablet    atorvastatin (LIPITOR) 20 MG tablet    Other Relevant Orders    POCT urinalysis dipstick    Lipid panel    Hyperglycemia        Relevant Orders    Hemoglobin A1c    Generalized anxiety disorder        Relevant Medications    FLUoxetine (PROZAC) 40 MG capsule       Follow-up: Return in about 6 months (around 03/25/2016), or if symptoms worsen or fail to improve, for annual exam, fasting.  [name], DO

## 2015-09-24 NOTE — Assessment & Plan Note (Signed)
Change med to xyzal daily con't nasonex rto prn

## 2015-09-24 NOTE — Patient Instructions (Signed)
Hypertension Hypertension, commonly called high blood pressure, is when the force of blood pumping through your arteries is too strong. Your arteries are the blood vessels that carry blood from your heart throughout your body. A blood pressure reading consists of a higher number over a lower number, such as 110/72. The higher number (systolic) is the pressure inside your arteries when your heart pumps. The lower number (diastolic) is the pressure inside your arteries when your heart relaxes. Ideally you want your blood pressure below 120/80. Hypertension forces your heart to work harder to pump blood. Your arteries may become narrow or stiff. Having untreated or uncontrolled hypertension can cause heart attack, stroke, kidney disease, and other problems. RISK FACTORS Some risk factors for high blood pressure are controllable. Others are not.  Risk factors you cannot control include:   Race. You may be at higher risk if you are African American.  Age. Risk increases with age.  Gender. Men are at higher risk than women before age 45 years. After age 65, women are at higher risk than men. Risk factors you can control include:  Not getting enough exercise or physical activity.  Being overweight.  Getting too much fat, sugar, calories, or salt in your diet.  Drinking too much alcohol. SIGNS AND SYMPTOMS Hypertension does not usually cause signs or symptoms. Extremely high blood pressure (hypertensive crisis) may cause headache, anxiety, shortness of breath, and nosebleed. DIAGNOSIS To check if you have hypertension, your health care provider will measure your blood pressure while you are seated, with your arm held at the level of your heart. It should be measured at least twice using the same arm. Certain conditions can cause a difference in blood pressure between your right and left arms. A blood pressure reading that is higher than normal on one occasion does not mean that you need treatment. If  it is not clear whether you have high blood pressure, you may be asked to return on a different day to have your blood pressure checked again. Or, you may be asked to monitor your blood pressure at home for 1 or more weeks. TREATMENT Treating high blood pressure includes making lifestyle changes and possibly taking medicine. Living a healthy lifestyle can help lower high blood pressure. You may need to change some of your habits. Lifestyle changes may include:  Following the DASH diet. This diet is high in fruits, vegetables, and whole grains. It is low in salt, red meat, and added sugars.  Keep your sodium intake below 2,300 mg per day.  Getting at least 30-45 minutes of aerobic exercise at least 4 times per week.  Losing weight if necessary.  Not smoking.  Limiting alcoholic beverages.  Learning ways to reduce stress. Your health care provider may prescribe medicine if lifestyle changes are not enough to get your blood pressure under control, and if one of the following is true:  You are 18-59 years of age and your systolic blood pressure is above 140.  You are 60 years of age or older, and your systolic blood pressure is above 150.  Your diastolic blood pressure is above 90.  You have diabetes, and your systolic blood pressure is over 140 or your diastolic blood pressure is over 90.  You have kidney disease and your blood pressure is above 140/90.  You have heart disease and your blood pressure is above 140/90. Your personal target blood pressure may vary depending on your medical conditions, your age, and other factors. HOME CARE INSTRUCTIONS    Have your blood pressure rechecked as directed by your health care provider.   Take medicines only as directed by your health care provider. Follow the directions carefully. Blood pressure medicines must be taken as prescribed. The medicine does not work as well when you skip doses. Skipping doses also puts you at risk for  problems.  Do not smoke.   Monitor your blood pressure at home as directed by your health care provider. SEEK MEDICAL CARE IF:   You think you are having a reaction to medicines taken.  You have recurrent headaches or feel dizzy.  You have swelling in your ankles.  You have trouble with your vision. SEEK IMMEDIATE MEDICAL CARE IF:  You develop a severe headache or confusion.  You have unusual weakness, numbness, or feel faint.  You have severe chest or abdominal pain.  You vomit repeatedly.  You have trouble breathing. MAKE SURE YOU:   Understand these instructions.  Will watch your condition.  Will get help right away if you are not doing well or get worse.   This information is not intended to replace advice given to you by your health care provider. Make sure you discuss any questions you have with your health care provider.   Document Released: 05/22/2005 Document Revised: 10/06/2014 Document Reviewed: 03/14/2013 Elsevier Interactive Patient Education 2016 [employer] Inc[employer]  

## 2015-09-24 NOTE — Progress Notes (Signed)
Pre visit review using our clinic review tool, if applicable. No additional management support is needed unless otherwise documented below in the visit note. 

## 2015-09-25 LAB — URINE CULTURE: Colony Count: >=100000

## 2015-09-30 ENCOUNTER — Other Ambulatory Visit: Payer: Self-pay

## 2015-09-30 DIAGNOSIS — E039 Hypothyroidism, unspecified: Secondary | ICD-10-CM

## 2015-09-30 MED ORDER — LEVOTHYROXINE SODIUM 125 MCG PO TABS: 125.0000 ug | ORAL_TABLET | Freq: Every day | ORAL | Status: DC

## 2015-11-30 DIAGNOSIS — H04121 Dry eye syndrome of right lacrimal gland: Secondary | ICD-10-CM | POA: Diagnosis not present

## 2015-12-09 ENCOUNTER — Telehealth: Payer: Self-pay | Admitting: Family Medicine

## 2015-12-09 DIAGNOSIS — R7989 Other specified abnormal findings of blood chemistry: Secondary | ICD-10-CM

## 2015-12-09 DIAGNOSIS — E039 Hypothyroidism, unspecified: Secondary | ICD-10-CM

## 2015-12-09 NOTE — Telephone Encounter (Signed)
Patient scheduled for 12/21/2015

## 2015-12-09 NOTE — Telephone Encounter (Signed)
The order are in, please schedule.     KP

## 2015-12-09 NOTE — Telephone Encounter (Signed)
Relation to [id number] Call back number:[phone] Pharmacy:  Reason for call:  Patient states PCP wanted her to her TSH done in 90 days from 09/24/15. Please advise requesting orders

## 2015-12-09 NOTE — Telephone Encounter (Signed)
LVM advising patient to schedule

## 2015-12-18 DIAGNOSIS — M4806 Spinal stenosis, lumbar region: Secondary | ICD-10-CM | POA: Diagnosis not present

## 2015-12-18 DIAGNOSIS — M5136 Other intervertebral disc degeneration, lumbar region: Secondary | ICD-10-CM | POA: Diagnosis not present

## 2015-12-18 DIAGNOSIS — M47816 Spondylosis without myelopathy or radiculopathy, lumbar region: Secondary | ICD-10-CM | POA: Diagnosis not present

## 2015-12-18 DIAGNOSIS — Z79891 Long term (current) use of opiate analgesic: Secondary | ICD-10-CM | POA: Diagnosis not present

## 2015-12-21 ENCOUNTER — Other Ambulatory Visit (INDEPENDENT_AMBULATORY_CARE_PROVIDER_SITE_OTHER): Payer: PPO

## 2015-12-21 DIAGNOSIS — R7989 Other specified abnormal findings of blood chemistry: Secondary | ICD-10-CM | POA: Diagnosis not present

## 2015-12-21 DIAGNOSIS — E039 Hypothyroidism, unspecified: Secondary | ICD-10-CM | POA: Diagnosis not present

## 2015-12-21 LAB — CBC WITH DIFFERENTIAL/PLATELET
Basophils Absolute: 0 10*3/uL (ref 0.0–0.1)
Basophils Relative: 0.6 % (ref 0.0–3.0)
EOS PCT: 1.5 % (ref 0.0–5.0)
Eosinophils Absolute: 0.1 10*3/uL (ref 0.0–0.7)
HCT: 40 % (ref 36.0–46.0)
Hemoglobin: 13.5 g/dL (ref 12.0–15.0)
LYMPHS ABS: 1.6 10*3/uL (ref 0.7–4.0)
Lymphocytes Relative: 27.2 % (ref 12.0–46.0)
MCHC: 33.8 g/dL (ref 30.0–36.0)
MCV: 88.3 fl (ref 78.0–100.0)
MONOS PCT: 9.7 % (ref 3.0–12.0)
Monocytes Absolute: 0.6 10*3/uL (ref 0.1–1.0)
NEUTROS ABS: 3.6 10*3/uL (ref 1.4–7.7)
NEUTROS PCT: 61 % (ref 43.0–77.0)
Platelets: 137 10*3/uL — ABNORMAL LOW (ref 150.0–400.0)
RBC: 4.53 Mil/uL (ref 3.87–5.11)
RDW: 13 % (ref 11.5–15.5)
WBC: 5.9 10*3/uL (ref 4.0–10.5)

## 2015-12-21 LAB — TSH: TSH: 0.95 u[IU]/mL (ref 0.35–4.50)

## 2015-12-27 ENCOUNTER — Other Ambulatory Visit: Payer: Self-pay | Admitting: Family Medicine

## 2015-12-27 DIAGNOSIS — E039 Hypothyroidism, unspecified: Secondary | ICD-10-CM

## 2016-01-21 DIAGNOSIS — M47816 Spondylosis without myelopathy or radiculopathy, lumbar region: Secondary | ICD-10-CM | POA: Diagnosis not present

## 2016-01-21 DIAGNOSIS — M5136 Other intervertebral disc degeneration, lumbar region: Secondary | ICD-10-CM | POA: Diagnosis not present

## 2016-01-28 DIAGNOSIS — M5136 Other intervertebral disc degeneration, lumbar region: Secondary | ICD-10-CM | POA: Diagnosis not present

## 2016-01-28 DIAGNOSIS — M4806 Spinal stenosis, lumbar region: Secondary | ICD-10-CM | POA: Diagnosis not present

## 2016-01-28 DIAGNOSIS — Z79891 Long term (current) use of opiate analgesic: Secondary | ICD-10-CM | POA: Diagnosis not present

## 2016-01-28 DIAGNOSIS — M47816 Spondylosis without myelopathy or radiculopathy, lumbar region: Secondary | ICD-10-CM | POA: Diagnosis not present

## 2016-03-15 ENCOUNTER — Other Ambulatory Visit: Payer: Self-pay | Admitting: Family Medicine

## 2016-03-15 DIAGNOSIS — Z1231 Encounter for screening mammogram for malignant neoplasm of breast: Secondary | ICD-10-CM

## 2016-03-19 ENCOUNTER — Other Ambulatory Visit: Payer: Self-pay | Admitting: Family Medicine

## 2016-03-21 ENCOUNTER — Ambulatory Visit
Admission: RE | Admit: 2016-03-21 | Discharge: 2016-03-21 | Disposition: A | Discharge status: Home / Self Care | Payer: PPO | Source: Ambulatory Visit | Attending: Family Medicine | Admitting: Family Medicine

## 2016-03-21 DIAGNOSIS — Z1231 Encounter for screening mammogram for malignant neoplasm of breast: Secondary | ICD-10-CM

## 2016-03-24 ENCOUNTER — Other Ambulatory Visit: Payer: Self-pay | Admitting: Family Medicine

## 2016-03-24 DIAGNOSIS — E039 Hypothyroidism, unspecified: Secondary | ICD-10-CM

## 2016-03-27 ENCOUNTER — Encounter: Payer: Self-pay | Admitting: Family Medicine

## 2016-03-27 ENCOUNTER — Ambulatory Visit (INDEPENDENT_AMBULATORY_CARE_PROVIDER_SITE_OTHER): Payer: PPO | Admitting: Family Medicine

## 2016-03-27 ENCOUNTER — Other Ambulatory Visit: Payer: Self-pay | Admitting: Family Medicine

## 2016-03-27 VITALS — BP 130/74 | HR 62 | Temp 97.8°F | Ht 66.0 in | Wt 247.6 lb

## 2016-03-27 DIAGNOSIS — E785 Hyperlipidemia, unspecified: Secondary | ICD-10-CM

## 2016-03-27 DIAGNOSIS — N644 Mastodynia: Secondary | ICD-10-CM | POA: Insufficient documentation

## 2016-03-27 DIAGNOSIS — E039 Hypothyroidism, unspecified: Secondary | ICD-10-CM

## 2016-03-27 DIAGNOSIS — R8299 Other abnormal findings in urine: Secondary | ICD-10-CM

## 2016-03-27 DIAGNOSIS — F411 Generalized anxiety disorder: Secondary | ICD-10-CM

## 2016-03-27 DIAGNOSIS — R079 Chest pain, unspecified: Secondary | ICD-10-CM

## 2016-03-27 DIAGNOSIS — Z23 Encounter for immunization: Secondary | ICD-10-CM

## 2016-03-27 DIAGNOSIS — I10 Essential (primary) hypertension: Secondary | ICD-10-CM

## 2016-03-27 DIAGNOSIS — M546 Pain in thoracic spine: Secondary | ICD-10-CM

## 2016-03-27 DIAGNOSIS — Z8679 Personal history of other diseases of the circulatory system: Secondary | ICD-10-CM

## 2016-03-27 DIAGNOSIS — R82998 Other abnormal findings in urine: Secondary | ICD-10-CM

## 2016-03-27 DIAGNOSIS — N952 Postmenopausal atrophic vaginitis: Secondary | ICD-10-CM

## 2016-03-27 LAB — LIPID PANEL
CHOL/HDL RATIO: 3
Cholesterol: 161 mg/dL (ref 0–200)
HDL: 49.3 mg/dL (ref >39.00)
LDL CALC: 88 mg/dL (ref 0–99)
NONHDL: 111.35
TRIGLYCERIDES: 117 mg/dL (ref 0.0–149.0)
VLDL: 23.4 mg/dL (ref 0.0–40.0)

## 2016-03-27 LAB — CBC WITH DIFFERENTIAL/PLATELET
BASOS ABS: 0 10*3/uL (ref 0.0–0.1)
Basophils Relative: 0.4 % (ref 0.0–3.0)
Eosinophils Absolute: 0.1 10*3/uL (ref 0.0–0.7)
Eosinophils Relative: 2.1 % (ref 0.0–5.0)
HCT: 40.4 % (ref 36.0–46.0)
Hemoglobin: 13.9 g/dL (ref 12.0–15.0)
LYMPHS ABS: 1.5 10*3/uL (ref 0.7–4.0)
Lymphocytes Relative: 23.9 % (ref 12.0–46.0)
MCHC: 34.3 g/dL (ref 30.0–36.0)
MCV: 87.7 fl (ref 78.0–100.0)
MONO ABS: 0.7 10*3/uL (ref 0.1–1.0)
MONOS PCT: 10.5 % (ref 3.0–12.0)
NEUTROS ABS: 4.1 10*3/uL (ref 1.4–7.7)
NEUTROS PCT: 63.1 % (ref 43.0–77.0)
PLATELETS: 140 10*3/uL — AB (ref 150.0–400.0)
RBC: 4.61 Mil/uL (ref 3.87–5.11)
RDW: 12.9 % (ref 11.5–15.5)
WBC: 6.4 10*3/uL (ref 4.0–10.5)

## 2016-03-27 LAB — POCT URINALYSIS DIPSTICK
Bilirubin, UA: NEGATIVE
Glucose, UA: NEGATIVE
Ketones, UA: NEGATIVE
NITRITE UA: NEGATIVE
PH UA: 6.5
Protein, UA: NEGATIVE
RBC UA: NEGATIVE
SPEC GRAV UA: 1.02
UROBILINOGEN UA: 0.2

## 2016-03-27 LAB — COMPREHENSIVE METABOLIC PANEL
ALT: 21 U/L (ref 0–35)
AST: 24 U/L (ref 0–37)
Albumin: 3.9 g/dL (ref 3.5–5.2)
Alkaline Phosphatase: 66 U/L (ref 39–117)
BILIRUBIN TOTAL: 0.6 mg/dL (ref 0.2–1.2)
BUN: 15 mg/dL (ref 6–23)
CALCIUM: 9.7 mg/dL (ref 8.4–10.5)
CHLORIDE: 101 meq/L (ref 96–112)
CO2: 29 meq/L (ref 19–32)
Creatinine, Ser: 0.93 mg/dL (ref 0.40–1.20)
GFR: 64.08 mL/min (ref >60.00)
GLUCOSE: 100 mg/dL — AB (ref 70–99)
POTASSIUM: 4 meq/L (ref 3.5–5.1)
Sodium: 135 mEq/L (ref 135–145)
Total Protein: 7.1 g/dL (ref 6.0–8.3)

## 2016-03-27 LAB — TSH: TSH: 1.55 u[IU]/mL (ref 0.35–4.50)

## 2016-03-27 MED ORDER — FLUOXETINE HCL 40 MG PO CAPS: ORAL_CAPSULE | ORAL | 1 refills | Status: DC

## 2016-03-27 MED ORDER — CYCLOBENZAPRINE HCL 10 MG PO TABS: 10.0000 mg | ORAL_TABLET | Freq: Three times a day (TID) | ORAL | 0 refills | Status: DC | PRN

## 2016-03-27 MED ORDER — LEVOTHYROXINE SODIUM 125 MCG PO TABS: 125.0000 ug | ORAL_TABLET | Freq: Every day | ORAL | 0 refills | Status: DC

## 2016-03-27 MED ORDER — LOSARTAN POTASSIUM-HCTZ 100-12.5 MG PO TABS: 1.0000 | ORAL_TABLET | Freq: Every day | ORAL | 1 refills | Status: DC

## 2016-03-27 MED ORDER — ESTROGENS, CONJUGATED 0.625 MG/GM VA CREA: 1.0000 | TOPICAL_CREAM | Freq: Every day | VAGINAL | 12 refills | Status: DC

## 2016-03-27 MED ORDER — ATORVASTATIN CALCIUM 20 MG PO TABS: ORAL_TABLET | ORAL | 1 refills | Status: DC

## 2016-03-27 NOTE — Assessment & Plan Note (Signed)
ekg normal F/u US aneurysm F/u with ortho if no relief and [name] normal

## 2016-03-27 NOTE — Progress Notes (Addendum)
Patient ID: Cassandra Rios, female    DOB: 1950-12-18  Age: 65 y.o. MRN: [id number]    Subjective:  Subjective  HPI Cassandra Rios presents for f/u cholesterol , bp and thyroid She is also c/o vaginal itching-- no d/c . She is also c/o back pain in thoracic area , no cp, no sob.  She is concerned because she is due to have the [name] of aneurysm done and she wants to  Make sure it has not inc in size.   Review of Systems  Constitutional: Negative for appetite change, diaphoresis, fatigue and unexpected weight change.  Eyes: Negative for pain, redness and visual disturbance.  Respiratory: Negative for cough, chest tightness, shortness of breath and wheezing.   Cardiovascular: Negative for chest pain, palpitations and leg swelling.  Endocrine: Negative for cold intolerance, heat intolerance, polydipsia, polyphagia and polyuria.  Genitourinary: Positive for genital sores and vaginal pain. Negative for difficulty urinating, dysuria and frequency.  Neurological: Negative for dizziness, light-headedness, numbness and headaches.    History Past Medical History:  Diagnosis Date  . Allergic rhinitis   . Chronic hoarseness   . Colon polyps   . COPD (chronic obstructive pulmonary disease) (HCC)    Dr. Marchelle Rios  . Dyslipidemia   . Hypertension   . Hypothyroidism   . MS (multiple sclerosis) (HCC)   . Obesity   . Stomach ulcer from aspirin/ibuprofen-like drugs (NSAID's)     She has a past surgical history that includes Cholecystectomy; Carpal tunnel release; Bariatric Surgery (09/07/08); Hiatal hernia repair (09/07/08); Abdominal hysterectomy; and Cholecystectomy.   Her family history includes Arthritis in her sister; COPD in her father and sister; Heart disease in her father and mother; Stroke (age of onset: 27) in her mother.She reports that she quit smoking about 8 years ago. Her smoking use included Cigarettes. She has a 70.00 pack-year smoking history. She has never used smokeless  tobacco. She reports that she does not drink alcohol or use drugs.  Current Outpatient Prescriptions on File Prior to Visit  Medication Sig Dispense Refill  . aspirin 81 MG tablet Take 81 mg by mouth daily.    . benzonatate (TESSALON) 200 MG capsule Take 1 capsule (200 mg total) by mouth 3 (three) times daily as needed for cough. 60 capsule 0  . Calcium Carbonate-Vitamin D (CALCIUM 600 + D PO) Take by mouth daily.      . celecoxib (CELEBREX) 200 MG capsule Take 200 mg by mouth daily as needed.    . fluticasone-salmeterol (ADVAIR HFA) 115-21 MCG/ACT inhaler Inhale 2 puffs into the lungs as needed. 1 Inhaler 11  . HYDROcodone-acetaminophen (NORCO/VICODIN) 5-325 MG per tablet Take 1 tablet by mouth every 6 (six) hours as needed for moderate pain.    . Interferon Beta-1b (BETASERON) 0.3 MG KIT injection Inject 0.25 mg into the skin every other day. 1 kit 6  . levocetirizine (XYZAL) 5 MG tablet Take 1 tablet (5 mg total) by mouth every evening. 30 tablet 5  . mometasone (NASONEX) 50 MCG/ACT nasal spray Place 2 sprays into the nose daily. 51 g 3  . Multiple Vitamin (MULTIVITAMIN) tablet Take 1 tablet by mouth 2 (two) times daily.       No current facility-administered medications on file prior to visit.      Objective:  Objective  Physical Exam  Constitutional: She is oriented to person, place, and time. She appears well-developed and well-nourished.  HENT:  Head: Normocephalic and atraumatic.  Eyes: Conjunctivae and EOM  are normal.  Neck: Normal range of motion. Neck supple. No JVD present. Carotid bruit is not present. No thyromegaly present.  Cardiovascular: Normal rate, regular rhythm and normal heart sounds.   No murmur heard. Pulmonary/Chest: Effort normal and breath sounds normal. No respiratory distress. She has no wheezes. She has no rales. She exhibits no tenderness.  Genitourinary:    There is breast tenderness. There is erythema and tenderness in the vagina. No bleeding in the  vagina. No vaginal discharge found.  Musculoskeletal: She exhibits no edema.  Neurological: She is alert and oriented to person, place, and time.  Psychiatric: She has a normal mood and affect.   BP 130/74 (BP Location: Left Arm, Patient Position: Sitting, Cuff Size: Large)   Pulse 62   Temp 97.8 F (36.6 C) (Oral)   Ht 5\' 6"  (1.676 m)   Wt 247 lb 9.6 oz (112.3 kg)   SpO2 98%   BMI 39.96 kg/m  Wt Readings from Last 3 Encounters:  03/27/16 247 lb 9.6 oz (112.3 kg)  09/24/15 249 lb 6.4 oz (113.1 kg)  09/06/15 246 lb (111.6 kg)     Lab Results  Component Value Date   WBC 6.4 03/27/2016   HGB 13.9 03/27/2016   HCT 40.4 03/27/2016   PLT 140.0 (L) 03/27/2016   GLUCOSE 100 (H) 03/27/2016   CHOL 161 03/27/2016   TRIG 117.0 03/27/2016   HDL 49.30 03/27/2016   LDLCALC 88 03/27/2016   ALT 21 03/27/2016   AST 24 03/27/2016   NA 135 03/27/2016   K 4.0 03/27/2016   CL 101 03/27/2016   CREATININE 0.93 03/27/2016   BUN 15 03/27/2016   CO2 29 03/27/2016   TSH 1.55 03/27/2016   HGBA1C 5.8 09/24/2015   MICROALBUR 2.5 (H) 09/15/2014    Mm Digital Screening Bilateral  Result Date: 03/23/2016 CLINICAL DATA:  Screening. EXAM: DIGITAL SCREENING BILATERAL MAMMOGRAM WITH CAD COMPARISON:  Previous exam(s). ACR Breast Density Category b: There are scattered areas of fibroglandular density. FINDINGS: There are no findings suspicious for malignancy. Images were processed with CAD. IMPRESSION: No mammographic evidence of malignancy. A result letter of this screening mammogram will be mailed directly to the patient. RECOMMENDATION: Screening mammogram in one year. (Code:SM-B-01Y) BI-RADS CATEGORY  1: Negative. Electronically Signed   By: Cassandra Rios M.D.   On: 03/23/2016 08:22    ekg-- nsr -- no acute changes Assessment & Plan:  Plan  I have changed Ms. Cassandra Rios's levothyroxine. I am also having her maintain her multivitamin, Calcium Carbonate-Vitamin D (CALCIUM 600 + D PO), aspirin, celecoxib,  HYDROcodone-acetaminophen, benzonatate, Interferon Beta-1b, fluticasone-salmeterol, levocetirizine, mometasone, losartan-hydrochlorothiazide, FLUoxetine, and atorvastatin.  Meds ordered this encounter  Medications  . losartan-hydrochlorothiazide (HYZAAR) 100-12.5 MG tablet    Sig: Take 1 tablet by mouth daily.    Dispense:  90 tablet    Refill:  1  . levothyroxine (SYNTHROID, LEVOTHROID) 125 MCG tablet    Sig: Take 1 tablet (125 mcg total) by mouth daily.    Dispense:  90 tablet    Refill:  0  . FLUoxetine (PROZAC) 40 MG capsule    Sig: TAKE 1 CAPSULE (40 MG TOTAL) BY MOUTH DAILY.    Dispense:  90 capsule    Refill:  1  . atorvastatin (LIPITOR) 20 MG tablet    Sig: TAKE 1 TABLET (20 MG TOTAL) BY MOUTH DAILY.    Dispense:  90 tablet    Refill:  1    Problem List Items Addressed This Visit  Unprioritized   Chest pain    ekg normal Actually thoracic back pain See Assessment and plan      HTN (hypertension)    Stable con't meds      Relevant Medications   losartan-hydrochlorothiazide (HYZAAR) 100-12.5 MG tablet   atorvastatin (LIPITOR) 20 MG tablet   Hypothyroidism    Check labs      Relevant Medications   levothyroxine (SYNTHROID, LEVOTHROID) 125 MCG tablet   Thoracic back pain    ekg normal F/u US aneurysm F/u with ortho if no relief and [name] normal       Other Visit Diagnoses    Need for prophylactic vaccination and inoculation against influenza    -  Primary   Relevant Orders   Flu vaccine HIGH DOSE PF (Fluzone High dose) (Completed)   Hx of abdominal aortic aneurysm       Relevant Orders   US Aorta   Hyperlipidemia LDL goal <100       Relevant Medications   losartan-hydrochlorothiazide (HYZAAR) 100-12.5 MG tablet   atorvastatin (LIPITOR) 20 MG tablet   Leukocytes in urine       Relevant Orders   Urine Culture   Hyperlipidemia, unspecified hyperlipidemia type       Relevant Medications   losartan-hydrochlorothiazide (HYZAAR) 100-12.5 MG tablet    atorvastatin (LIPITOR) 20 MG tablet   Generalized anxiety disorder       Relevant Medications   FLUoxetine (PROZAC) 40 MG capsule      Follow-up: Return in about 6 months (around 09/25/2016) for hypertension, hyperlipidemia, annual exam, fasting.  [name], DO

## 2016-03-27 NOTE — Assessment & Plan Note (Signed)
Check labs 

## 2016-03-27 NOTE — Addendum Note (Signed)
Addended by: Caffie Pinto on: 03/27/2016 10:53 AM   Modules accepted: Orders

## 2016-03-27 NOTE — Patient Instructions (Signed)
Abdominal Aortic Aneurysm An aneurysm is a weakened or damaged part of an artery wall that bulges from the normal force of blood pumping through the body. An abdominal aortic aneurysm is an aneurysm that occurs in the lower part of the aorta, the main artery of the body.  The major concern with an abdominal aortic aneurysm is that it can enlarge and burst (rupture) or blood can flow between the layers of the wall of the aorta through a tear (aorticdissection). Both of these conditions can cause bleeding inside the body and can be life threatening unless diagnosed and treated promptly. CAUSES  The exact cause of an abdominal aortic aneurysm is unknown. Some contributing factors are:   A hardening of the arteries caused by the buildup of fat and other substances in the lining of a blood vessel (arteriosclerosis).  Inflammation of the walls of an artery (arteritis).   Connective tissue diseases, such as Marfan syndrome.   Abdominal trauma.   An infection, such as syphilis or staphylococcus, in the wall of the aorta (infectious aortitis) caused by bacteria. RISK FACTORS  Risk factors that contribute to an abdominal aortic aneurysm may include:  Age older than 60 years.   High blood pressure (hypertension).  Female gender.  Ethnicity (white race).  Obesity.  Family history of aneurysm (first degree relatives only).  Tobacco use. PREVENTION  The following healthy lifestyle habits may help decrease your risk of abdominal aortic aneurysm:  Quitting smoking. Smoking can raise your blood pressure and cause arteriosclerosis.  Limiting or avoiding alcohol.  Keeping your blood pressure, blood sugar level, and cholesterol levels within normal limits.  Decreasing your salt intake. In somepeople, too much salt can raise blood pressure and increase your risk of abdominal aortic aneurysm.  Eating a diet low in saturated fats and cholesterol.  Increasing your fiber intake by including  whole grains, vegetables, and fruits in your diet. Eating these foods may help lower blood pressure.  Maintaining a healthy weight.  Staying physically active and exercising regularly. SYMPTOMS  The symptoms of abdominal aortic aneurysm may vary depending on the size and rate of growth of the aneurysm.Most grow slowly and do not have any symptoms. When symptoms do occur, they may include:  Pain (abdomen, side, lower back, or groin). The pain may vary in intensity. A sudden onset of severe pain may indicate that the aneurysm has ruptured.  Feeling full after eating only small amounts of food.  Nausea or vomiting or both.  Feeling a pulsating lump in the abdomen.  Feeling faint or passing out. DIAGNOSIS  Since most unruptured abdominal aortic aneurysms have no symptoms, they are often discovered during diagnostic exams for other conditions. An aneurysm may be found during the following procedures:  Ultrasonography (A one-time screening for abdominal aortic aneurysm by ultrasonography is also recommended for all men aged 65-75 years who have ever smoked).  X-ray exams.  A computed tomography (CT).  Magnetic resonance imaging (MRI).  Angiography or arteriography. TREATMENT  Treatment of an abdominal aortic aneurysm depends on the size of your aneurysm, your age, and risk factors for rupture. Medication to control blood pressure and pain may be used to manage aneurysms smaller than 6 cm. Regular monitoring for enlargement may be recommended by your caregiver if:  The aneurysm is 3-4 cm in size (an annual ultrasonography may be recommended).  The aneurysm is 4-4.5 cm in size (an ultrasonography every 6 months may be recommended).  The aneurysm is larger than 4.5 cm in   size (your caregiver may ask that you be examined by a vascular surgeon). If your aneurysm is larger than 6 cm, surgical repair may be recommended. There are two main methods for repair of an aneurysm:   Endovascular  repair (a minimally invasive surgery). This is done most often.  Open repair. This method is used if an endovascular repair is not possible.   This information is not intended to replace advice given to you by your health care provider. Make sure you discuss any questions you have with your health care provider.   Document Released: 03/01/2005 Document Revised: 09/16/2012 Document Reviewed: 06/21/2012 Elsevier Interactive Patient Education 2016 [employer] Inc.  

## 2016-03-27 NOTE — Addendum Note (Signed)
Addended by: Peggyann Shoals on: 03/27/2016 09:48 AM   Modules accepted: Orders

## 2016-03-27 NOTE — Assessment & Plan Note (Signed)
Stable con't meds 

## 2016-03-27 NOTE — Addendum Note (Signed)
Addended by: Roma Schanz R on: 03/27/2016 09:18 PM   Modules accepted: Orders

## 2016-03-27 NOTE — Progress Notes (Signed)
Patient ID: Cassandra Rios, female    DOB: 10/24/1950  Age: 65 y.o. MRN: [id number]    Subjective:  Subjective  HPI Cassandra Rios presents for f/u thyroid , cholesterol and bp -- pt c/o of pain between shoulder blades ---  X several weeks -- no known injury but she is concerned about her history of aneurysm.  She is also having discomfort in upper chest and L breast.   She sees Dr Ramos for her low back and she sees Dr Willis for her MS.   It is time for the Us to check the aneurysm.  Pt is also asking to see Dr Smith for Cardiology -- that is who her husband sees.   Chest pain occurs rarely -- she is under a lot of stress.  Her husband has stage 1 squamous cell ca lung.  He starting a radiation soon.    Review of Systems  Constitutional: Negative for activity change, appetite change, fatigue and unexpected weight change.  Respiratory: Negative for cough and shortness of breath.   Cardiovascular: Positive for chest pain. Negative for palpitations.  Psychiatric/Behavioral: Negative for behavioral problems and dysphoric mood. The patient is not nervous/anxious.     History Past Medical History:  Diagnosis Date  . Allergic rhinitis   . Chronic hoarseness   . Colon polyps   . COPD (chronic obstructive pulmonary disease) (HCC)    Dr. Ramaswamy  . Dyslipidemia   . Hypertension   . Hypothyroidism   . MS (multiple sclerosis) (HCC)   . Obesity   . Stomach ulcer from aspirin/ibuprofen-like drugs (NSAID's)     She has a past surgical history that includes Cholecystectomy; Carpal tunnel release; Bariatric Surgery (09/07/08); Hiatal hernia repair (09/07/08); Abdominal hysterectomy; and Cholecystectomy.   Her family history includes Arthritis in her sister; COPD in her father and sister; Heart disease in her father and mother; Stroke (age of onset: 77) in her mother.She reports that she quit smoking about 8 years ago. Her smoking use included Cigarettes. She has a 70.00 pack-year smoking  history. She has never used smokeless tobacco. She reports that she does not drink alcohol or use drugs.  Current Outpatient Prescriptions on File Prior to Visit  Medication Sig Dispense Refill  . aspirin 81 MG tablet Take 81 mg by mouth daily.    . atorvastatin (LIPITOR) 20 MG tablet TAKE 1 TABLET (20 MG TOTAL) BY MOUTH DAILY. 90 tablet 1  . benzonatate (TESSALON) 200 MG capsule Take 1 capsule (200 mg total) by mouth 3 (three) times daily as needed for cough. 60 capsule 0  . Calcium Carbonate-Vitamin D (CALCIUM 600 + D PO) Take by mouth daily.      . celecoxib (CELEBREX) 200 MG capsule Take 200 mg by mouth daily as needed.    . FLUoxetine (PROZAC) 40 MG capsule TAKE 1 CAPSULE (40 MG TOTAL) BY MOUTH DAILY. 90 capsule 1  . fluticasone-salmeterol (ADVAIR HFA) 115-21 MCG/ACT inhaler Inhale 2 puffs into the lungs as needed. 1 Inhaler 11  . HYDROcodone-acetaminophen (NORCO/VICODIN) 5-325 MG per tablet Take 1 tablet by mouth every 6 (six) hours as needed for moderate pain.    . Interferon Beta-1b (BETASERON) 0.3 MG KIT injection Inject 0.25 mg into the skin every other day. 1 kit 6  . levocetirizine (XYZAL) 5 MG tablet Take 1 tablet (5 mg total) by mouth every evening. 30 tablet 5  . levothyroxine (SYNTHROID, LEVOTHROID) 125 MCG tablet TAKE 1 TABLET EVERY DAY 90 tablet 0  .   Patient ID: Cassandra Rios, female    DOB: 08/14/1950  Age: 65 y.o. MRN: [id number]    Subjective:  Subjective  HPI Cassandra Rios presents for f/u thyroid , cholesterol and bp -- pt c/o of pain between shoulder blades ---  X several weeks -- no known injury but she is concerned about her history of aneurysm.  She is also having discomfort in upper chest and L breast.   She sees Dr Ramos for her low back and she sees Dr Willis for her MS.   It is time for the Us to check the aneurysm.  Pt is also asking to see Dr Smith for Cardiology -- that is who her husband sees.   Chest pain occurs rarely -- she is under a lot of stress.  Her husband has stage 1 squamous cell ca lung.  He starting a radiation soon.    Review of Systems  Constitutional: Negative for activity change, appetite change, fatigue and unexpected weight change.  Respiratory: Negative for cough and shortness of breath.   Cardiovascular: Positive for chest pain. Negative for palpitations.  Psychiatric/Behavioral: Negative for behavioral problems and dysphoric mood. The patient is not nervous/anxious.     History Past Medical History:  Diagnosis Date  . Allergic rhinitis   . Chronic hoarseness   . Colon polyps   . COPD (chronic obstructive pulmonary disease) (HCC)    Dr. Ramaswamy  . Dyslipidemia   . Hypertension   . Hypothyroidism   . MS (multiple sclerosis) (HCC)   . Obesity   . Stomach ulcer from aspirin/ibuprofen-like drugs (NSAID's)     She has a past surgical history that includes Cholecystectomy; Carpal tunnel release; Bariatric Surgery (09/07/08); Hiatal hernia repair (09/07/08); Abdominal hysterectomy; and Cholecystectomy.   Her family history includes Arthritis in her sister; COPD in her father and sister; Heart disease in her father and mother; Stroke (age of onset: 77) in her mother.She reports that she quit smoking about 8 years ago. Her smoking use included Cigarettes. She has a 70.00 pack-year smoking  history. She has never used smokeless tobacco. She reports that she does not drink alcohol or use drugs.  Current Outpatient Prescriptions on File Prior to Visit  Medication Sig Dispense Refill  . aspirin 81 MG tablet Take 81 mg by mouth daily.    . atorvastatin (LIPITOR) 20 MG tablet TAKE 1 TABLET (20 MG TOTAL) BY MOUTH DAILY. 90 tablet 1  . benzonatate (TESSALON) 200 MG capsule Take 1 capsule (200 mg total) by mouth 3 (three) times daily as needed for cough. 60 capsule 0  . Calcium Carbonate-Vitamin D (CALCIUM 600 + D PO) Take by mouth daily.      . celecoxib (CELEBREX) 200 MG capsule Take 200 mg by mouth daily as needed.    . FLUoxetine (PROZAC) 40 MG capsule TAKE 1 CAPSULE (40 MG TOTAL) BY MOUTH DAILY. 90 capsule 1  . fluticasone-salmeterol (ADVAIR HFA) 115-21 MCG/ACT inhaler Inhale 2 puffs into the lungs as needed. 1 Inhaler 11  . HYDROcodone-acetaminophen (NORCO/VICODIN) 5-325 MG per tablet Take 1 tablet by mouth every 6 (six) hours as needed for moderate pain.    . Interferon Beta-1b (BETASERON) 0.3 MG KIT injection Inject 0.25 mg into the skin every other day. 1 kit 6  . levocetirizine (XYZAL) 5 MG tablet Take 1 tablet (5 mg total) by mouth every evening. 30 tablet 5  . levothyroxine (SYNTHROID, LEVOTHROID) 125 MCG tablet TAKE 1 TABLET EVERY DAY 90 tablet 0  .   Patient ID: Cassandra Rios, female    DOB: 10/24/1950  Age: 65 y.o. MRN: [id number]    Subjective:  Subjective  HPI Cassandra Rios presents for f/u thyroid , cholesterol and bp -- pt c/o of pain between shoulder blades ---  X several weeks -- no known injury but she is concerned about her history of aneurysm.  She is also having discomfort in upper chest and L breast.   She sees Dr Ramos for her low back and she sees Dr Willis for her MS.   It is time for the Us to check the aneurysm.  Pt is also asking to see Dr Smith for Cardiology -- that is who her husband sees.   Chest pain occurs rarely -- she is under a lot of stress.  Her husband has stage 1 squamous cell ca lung.  He starting a radiation soon.    Review of Systems  Constitutional: Negative for activity change, appetite change, fatigue and unexpected weight change.  Respiratory: Negative for cough and shortness of breath.   Cardiovascular: Positive for chest pain. Negative for palpitations.  Psychiatric/Behavioral: Negative for behavioral problems and dysphoric mood. The patient is not nervous/anxious.     History Past Medical History:  Diagnosis Date  . Allergic rhinitis   . Chronic hoarseness   . Colon polyps   . COPD (chronic obstructive pulmonary disease) (HCC)    Dr. Ramaswamy  . Dyslipidemia   . Hypertension   . Hypothyroidism   . MS (multiple sclerosis) (HCC)   . Obesity   . Stomach ulcer from aspirin/ibuprofen-like drugs (NSAID's)     She has a past surgical history that includes Cholecystectomy; Carpal tunnel release; Bariatric Surgery (09/07/08); Hiatal hernia repair (09/07/08); Abdominal hysterectomy; and Cholecystectomy.   Her family history includes Arthritis in her sister; COPD in her father and sister; Heart disease in her father and mother; Stroke (age of onset: 77) in her mother.She reports that she quit smoking about 8 years ago. Her smoking use included Cigarettes. She has a 70.00 pack-year smoking  history. She has never used smokeless tobacco. She reports that she does not drink alcohol or use drugs.  Current Outpatient Prescriptions on File Prior to Visit  Medication Sig Dispense Refill  . aspirin 81 MG tablet Take 81 mg by mouth daily.    . atorvastatin (LIPITOR) 20 MG tablet TAKE 1 TABLET (20 MG TOTAL) BY MOUTH DAILY. 90 tablet 1  . benzonatate (TESSALON) 200 MG capsule Take 1 capsule (200 mg total) by mouth 3 (three) times daily as needed for cough. 60 capsule 0  . Calcium Carbonate-Vitamin D (CALCIUM 600 + D PO) Take by mouth daily.      . celecoxib (CELEBREX) 200 MG capsule Take 200 mg by mouth daily as needed.    . FLUoxetine (PROZAC) 40 MG capsule TAKE 1 CAPSULE (40 MG TOTAL) BY MOUTH DAILY. 90 capsule 1  . fluticasone-salmeterol (ADVAIR HFA) 115-21 MCG/ACT inhaler Inhale 2 puffs into the lungs as needed. 1 Inhaler 11  . HYDROcodone-acetaminophen (NORCO/VICODIN) 5-325 MG per tablet Take 1 tablet by mouth every 6 (six) hours as needed for moderate pain.    . Interferon Beta-1b (BETASERON) 0.3 MG KIT injection Inject 0.25 mg into the skin every other day. 1 kit 6  . levocetirizine (XYZAL) 5 MG tablet Take 1 tablet (5 mg total) by mouth every evening. 30 tablet 5  . levothyroxine (SYNTHROID, LEVOTHROID) 125 MCG tablet TAKE 1 TABLET EVERY DAY 90 tablet 0  .

## 2016-03-27 NOTE — Progress Notes (Signed)
Pre visit review using our clinic review tool, if applicable. No additional management support is needed unless otherwise documented below in the visit note. 

## 2016-03-27 NOTE — Addendum Note (Signed)
Addended by: Ewing Schlein on: 03/27/2016 09:03 AM   Modules accepted: Orders

## 2016-03-27 NOTE — Assessment & Plan Note (Signed)
ekg normal Actually thoracic back pain See Assessment and plan

## 2016-03-27 NOTE — Addendum Note (Signed)
Addended by: Ewing Schlein on: 03/27/2016 09:43 AM   Modules accepted: Orders

## 2016-03-29 LAB — URINE CULTURE: ORGANISM ID, BACTERIA: NO GROWTH

## 2016-03-30 ENCOUNTER — Telehealth: Payer: Self-pay

## 2016-03-30 NOTE — Telephone Encounter (Signed)
Cassandra Rios (Key: [id number]) [phone] Cyclobenzaprine HCl 10MG  tablets Status: Sent to Plan Created: October 23rd, 2017 [phone] Sent: October 23rd, 2017  This request has been approved from 03/27/2016 until 06/04/2017. Approval letter sent to be scanned.      KP

## 2016-04-11 ENCOUNTER — Encounter (HOSPITAL_COMMUNITY): Payer: Self-pay

## 2016-04-14 ENCOUNTER — Ambulatory Visit
Admission: RE | Admit: 2016-04-14 | Discharge: 2016-04-14 | Disposition: A | Discharge status: Home / Self Care | Payer: PPO | Source: Ambulatory Visit | Attending: Family Medicine | Admitting: Family Medicine

## 2016-04-14 ENCOUNTER — Other Ambulatory Visit: Payer: Self-pay | Admitting: Family Medicine

## 2016-04-14 DIAGNOSIS — I714 Abdominal aortic aneurysm, without rupture, unspecified: Secondary | ICD-10-CM

## 2016-04-14 DIAGNOSIS — I723 Aneurysm of iliac artery: Secondary | ICD-10-CM

## 2016-04-14 DIAGNOSIS — Z8679 Personal history of other diseases of the circulatory system: Secondary | ICD-10-CM

## 2016-04-14 NOTE — Progress Notes (Signed)
Vascular referral put in

## 2016-05-08 ENCOUNTER — Encounter: Payer: Self-pay | Admitting: *Deleted

## 2016-05-12 ENCOUNTER — Encounter: Payer: Self-pay | Admitting: Interventional Cardiology

## 2016-05-12 ENCOUNTER — Encounter (INDEPENDENT_AMBULATORY_CARE_PROVIDER_SITE_OTHER): Payer: Self-pay

## 2016-05-12 ENCOUNTER — Ambulatory Visit (INDEPENDENT_AMBULATORY_CARE_PROVIDER_SITE_OTHER): Payer: PPO | Admitting: Interventional Cardiology

## 2016-05-12 VITALS — BP 130/74 | HR 87 | Ht 66.0 in | Wt 248.4 lb

## 2016-05-12 DIAGNOSIS — R079 Chest pain, unspecified: Secondary | ICD-10-CM

## 2016-05-12 DIAGNOSIS — E785 Hyperlipidemia, unspecified: Secondary | ICD-10-CM | POA: Diagnosis not present

## 2016-05-12 DIAGNOSIS — Z8249 Family history of ischemic heart disease and other diseases of the circulatory system: Secondary | ICD-10-CM | POA: Diagnosis not present

## 2016-05-12 DIAGNOSIS — J449 Chronic obstructive pulmonary disease, unspecified: Secondary | ICD-10-CM | POA: Diagnosis not present

## 2016-05-12 DIAGNOSIS — I10 Essential (primary) hypertension: Secondary | ICD-10-CM

## 2016-05-12 NOTE — Patient Instructions (Signed)
Medication Instructions:  None  Labwork: None  Testing/Procedures: Your physician has requested that you have a lexiscan myoview. For further information please visit [email or web address]. Please follow instruction sheet, as given.   Follow-Up: Your physician recommends that you schedule a follow-up appointment as needed with Dr. Smith.   Any Other Special Instructions Will Be Listed Below (If Applicable).     If you need a refill on your cardiac medications before your next appointment, please call your pharmacy.   

## 2016-05-12 NOTE — Progress Notes (Signed)
Cardiology Office Note    Date:  05/12/2016   ID:  Cassandra Rios, DOB 1950/08/04, MRN [id number]  PCP:  [name], DO  Cardiologist: Lesleigh Noe, MD   Chief Complaint  Patient presents with  . Loss of Consciousness  . Chest Pain    History of Present Illness:  Cassandra Rios is a 65 y.o. female referred by [facility]  for evaluation of recurring exertional dyspnea and chest discomfort, diaphoresis, and near syncope.  For 3-6 months she has experienced an aching precordial chest discomfort with associated dyspnea with moderate physical activity such as climbing stairs. There is some mild orthopnea. There has been trace to mild lower extremity edema. She denies cough. Frequently she will have spontaneous episodes of diaphoresis and weakness/near syncope. No true episode of syncope. There is no prior history of heart disease. Her risk factors include prior heavy smoking for greater than 30 years, hypertension, and hyperlipidemia.  She has a history of an infrarenal abdominal aortic aneurysm measuring 3.9 cm. This is being followed by Dr. Karie Schwalbe. Early.  Past Medical History:  Diagnosis Date  . Allergic rhinitis   . Chronic hoarseness   . Colon polyps   . COPD (chronic obstructive pulmonary disease) (HCC)    Dr. Marchelle Gearing  . Dyslipidemia   . Hypertension   . Hypothyroidism   . MS (multiple sclerosis) (HCC)   . Obesity   . Stomach ulcer from aspirin/ibuprofen-like drugs (NSAID's)     Past Surgical History:  Procedure Laterality Date  . ABDOMINAL HYSTERECTOMY    . BARIATRIC SURGERY  09/07/08   lap band  . CARPAL TUNNEL RELEASE    . CHOLECYSTECTOMY    . CHOLECYSTECTOMY    . HIATAL HERNIA REPAIR  09/07/08    Current Medications: Outpatient Medications Prior to Visit  Medication Sig Dispense Refill  . aspirin 81 MG tablet Take 81 mg by mouth daily.    Marland Kitchen atorvastatin (LIPITOR) 20 MG tablet TAKE 1 TABLET (20 MG TOTAL) BY MOUTH DAILY. 90 tablet 1  . Calcium  Carbonate-Vitamin D (CALCIUM 600 + D PO) Take 1 tablet by mouth daily.     . cyclobenzaprine (FLEXERIL) 10 MG tablet Take 1 tablet (10 mg total) by mouth 3 (three) times daily as needed for muscle spasms. 30 tablet 0  . FLUoxetine (PROZAC) 40 MG capsule TAKE 1 CAPSULE (40 MG TOTAL) BY MOUTH DAILY. 90 capsule 1  . HYDROcodone-acetaminophen (NORCO/VICODIN) 5-325 MG per tablet Take 1 tablet by mouth every 6 (six) hours as needed for moderate pain.    . Interferon Beta-1b (BETASERON) 0.3 MG KIT injection Inject 0.25 mg into the skin every other day. 1 kit 6  . levothyroxine (SYNTHROID, LEVOTHROID) 125 MCG tablet Take 1 tablet (125 mcg total) by mouth daily. 90 tablet 0  . losartan-hydrochlorothiazide (HYZAAR) 100-12.5 MG tablet Take 1 tablet by mouth daily. 90 tablet 1  . Multiple Vitamin (MULTIVITAMIN) tablet Take 1 tablet by mouth 2 (two) times daily.      . benzonatate (TESSALON) 200 MG capsule Take 1 capsule (200 mg total) by mouth 3 (three) times daily as needed for cough. (Patient not taking: Reported on 05/12/2016) 60 capsule 0  . celecoxib (CELEBREX) 200 MG capsule Take 200 mg by mouth daily as needed.    . conjugated estrogens (PREMARIN) vaginal cream Place 1 Applicatorful vaginally daily. (Patient not taking: Reported on 05/12/2016) 42.5 g 12  . fluticasone-salmeterol (ADVAIR HFA) 115-21 MCG/ACT inhaler Inhale 2  puffs into the lungs as needed. (Patient not taking: Reported on 05/12/2016) 1 Inhaler 11  . levocetirizine (XYZAL) 5 MG tablet Take 1 tablet (5 mg total) by mouth every evening. (Patient not taking: Reported on 05/12/2016) 30 tablet 5  . mometasone (NASONEX) 50 MCG/ACT nasal spray Place 2 sprays into the nose daily. (Patient not taking: Reported on 05/12/2016) 51 g 3   No facility-administered medications prior to visit.      Allergies:   Codeine; Hydromorphone hcl; Ibuprofen; and Morphine sulfate   Social History   Social History  . Marital status: Married    Spouse name: Casimiro Needle    . Number of children: 2  . Years of education: 16   Occupational History  . 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 Smoking status: Former Smoker    Packs/day: 2.00    Years: 35.00    Types: Cigarettes    Quit date: 04/05/2007  . Smokeless tobacco: Never Used  . Alcohol use No     Comment: very rare  . Drug use: No  . Sexual activity: Yes    Partners: Male   Other Topics Concern  . None   Social History Narrative   Patient is right handed,reside in home with husband     Family History:  The patient's family history includes AAA (abdominal aortic aneurysm) in her sister; Arthritis in her sister; COPD in her father and sister; Dementia in her sister; Heart disease in her father and mother; Other in her sister; Stroke (age of onset: 80) in her mother.   ROS:   Please see the history of present illness.    Dizziness disturbance A, back pain, dizziness, nausea, balance and walking problems, headaches, leg pain, excessive sweating, and fatigue.  All other systems reviewed and are negative.   PHYSICAL EXAM:   VS:  BP 130/74 (BP Location: Right Arm)   Pulse 87   Ht 5\' 6"  (1.676 m)   Wt 248 lb 6.4 oz (112.7 kg)   BMI 40.09 kg/m    GEN: Well nourished, well developed, in no acute distress  HEENT: normal  Neck: no JVD, carotid bruits, or masses Cardiac: RRR; no murmurs, rubs, or gallops,no edema  Respiratory:  clear to auscultation bilaterally, normal work of breathing GI: soft, nontender, nondistended, + BS MS: no deformity or atrophy  Skin: warm and dry, no rash Neuro:  Alert and Oriented x 3, Strength and sensation are intact Psych: euthymic mood, full affect  Wt Readings from Last 3 Encounters:  05/12/16 248 lb 6.4 oz (112.7 kg)  03/27/16 247 lb 9.6 oz (112.3 kg)  09/24/15 249 lb 6.4 oz (113.1 kg)      Studies/Labs Reviewed:   EKG:  EKG  R-wave V1, otherwise unremarkable.  Recent  Labs: 03/27/2016: ALT 21; BUN 15; Creatinine, Ser 0.93; Hemoglobin 13.9; Platelets 140.0; Potassium 4.0; Sodium 135; TSH 1.55   Lipid Panel    Component Value Date/Time   CHOL 161 03/27/2016 0948   TRIG 117.0 03/27/2016 0948   HDL 49.30 03/27/2016 0948   CHOLHDL 3 03/27/2016 0948   VLDL 23.4 03/27/2016 0948   LDLCALC 88 03/27/2016 0948    Additional studies/ records that were reviewed today include:  Abdominal aortic ultrasound, 04/14/16: IMPRESSION: 1. 3.9 cm distal abdominal aortic aneurysm. This is increased in size slightly from prior exam. Recommend followup by ultrasound in 2 years. This recommendation follows ACR consensus guidelines: White Paper  of the ACR Incidental Findings Committee II on Vascular Findings. J Am Coll Radiol 2013; 10:789-794. Dictation #1 [id number]  [id number]  2. 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 Vascular surgical consultation suggested .   ASSESSMENT:    1. Chest pain on exertion   2. Family history of abdominal aortic aneurysm   3. COPD mixed type (HCC)   4. Hyperlipidemia with target LDL less than 100   5. Essential hypertension      PLAN:  In order of problems listed above:  1. Given risk factors, coronary disease needs to be excluded. Because of multiple sclerosis and difficulty with ambulation, a pharmacologic Myoview study will be done. Further evaluation will be dependent upon findings. 2. The patient has a 38 mm abdominal aortic aneurysm followed by Dr. Arbie Cookey. 3. Severity has not been established. 4. Last LDL was 88 and is quite good given her risk profile. 5. No significant blood pressure elevation today on her current medical regimen which includes losartan HCT.    Medication Adjustments/Labs and Tests Ordered: Current medicines are reviewed at length with the patient today.  Concerns regarding medicines are outlined above.  Medication changes, Labs and Tests ordered today are listed in the Patient  Instructions below. Patient Instructions  Medication Instructions:  None  Labwork: None  Testing/Procedures: Your physician has requested that you have a lexiscan myoview. For further information please visit [email or web address] Please follow instruction sheet, as given.   Follow-Up: Your physician recommends that you schedule a follow-up appointment as needed with Dr. Katrinka Blazing.   Any Other Special Instructions Will Be Listed Below (If Applicable).     If you need a refill on your cardiac medications before your next appointment, please call your pharmacy.      Signed, Lesleigh Noe, MD  05/12/2016 3:10 PM    [facility] [address] [place], [place], [place]  [address] Phone: [phone]; Fax: [phone]

## 2016-05-17 ENCOUNTER — Encounter (HOSPITAL_COMMUNITY): Payer: PPO

## 2016-05-18 ENCOUNTER — Telehealth (HOSPITAL_COMMUNITY): Payer: Self-pay | Admitting: *Deleted

## 2016-05-18 NOTE — Telephone Encounter (Signed)
Patient given detailed instructions per Myocardial Perfusion Study Information Sheet for the test on 05/22/16 Patient notified to arrive 15 minutes early and that it is imperative to arrive on time for appointment to keep from having the test rescheduled.  If you need to cancel or reschedule your appointment, please call the office within 24 hours of your appointment. Failure to do so may result in a cancellation of your appointment, and a $50 no show fee. Patient verbalized understanding. Cassandra Rios

## 2016-05-19 ENCOUNTER — Other Ambulatory Visit: Payer: Self-pay | Admitting: Pharmacist

## 2016-05-19 NOTE — Patient Outreach (Signed)
Outreach call to Cassandra Rios regarding her request for follow up from the [facility] Medication Adherence Campaign. Called and spoke with patient. HIPAA identifiers verified and verbal consent received.  Patient reports that she has been taking her atorvastatin daily as directed. Denies any missed doses or any barriers to taking her medications such as cost or side effects.  Reports that she has been having difficulty with the cost of her Betaseron. Reports that she currently has some of the medication, but is working with [employer] to get approved for patient assistance. States that she is expecting a response from them today.    Patient reports that she has no further medication questions or concerns at this time. Provide patient with my phone number.  Harlow Asa, PharmD, [facility] [phone]

## 2016-05-22 ENCOUNTER — Ambulatory Visit (HOSPITAL_COMMUNITY): Payer: PPO | Attending: Internal Medicine

## 2016-05-22 DIAGNOSIS — R079 Chest pain, unspecified: Secondary | ICD-10-CM | POA: Insufficient documentation

## 2016-05-22 DIAGNOSIS — G35 Multiple sclerosis: Secondary | ICD-10-CM | POA: Diagnosis not present

## 2016-05-22 MED ORDER — TECHNETIUM TC 99M TETROFOSMIN IV KIT
33.0000 | PACK | Freq: Once | INTRAVENOUS | Status: AC | PRN
  Administered 2016-05-22: 33 via INTRAVENOUS
  Filled 2016-05-22: qty 33

## 2016-05-22 MED ORDER — REGADENOSON 0.4 MG/5ML IV SOLN
0.4000 mg | Freq: Once | INTRAVENOUS | Status: AC
  Administered 2016-05-22: 0.4 mg via INTRAVENOUS

## 2016-05-23 ENCOUNTER — Ambulatory Visit (HOSPITAL_COMMUNITY): Payer: PPO | Attending: Cardiovascular Disease

## 2016-05-23 LAB — MYOCARDIAL PERFUSION IMAGING
CHL CUP NUCLEAR SDS: 2
LHR: 0.31
LV sys vol: 17 mL
LVDIAVOL: 64 mL (ref 46–106)
Peak HR: 79 {beats}/min
Rest HR: 77 {beats}/min
SRS: 1
SSS: 3
TID: 0.86

## 2016-05-23 MED ORDER — TECHNETIUM TC 99M TETROFOSMIN IV KIT
32.5000 | PACK | Freq: Once | INTRAVENOUS | Status: AC | PRN
  Administered 2016-05-23: 32.5 via INTRAVENOUS
  Filled 2016-05-23: qty 33

## 2016-05-25 ENCOUNTER — Encounter: Payer: Self-pay | Admitting: Vascular Surgery

## 2016-06-06 ENCOUNTER — Encounter: Payer: Self-pay | Admitting: Vascular Surgery

## 2016-06-06 ENCOUNTER — Ambulatory Visit (INDEPENDENT_AMBULATORY_CARE_PROVIDER_SITE_OTHER): Payer: PPO | Admitting: Vascular Surgery

## 2016-06-06 VITALS — BP 153/90 | HR 82 | Temp 97.4°F | Resp 20 | Ht 66.0 in | Wt 247.0 lb

## 2016-06-06 DIAGNOSIS — I714 Abdominal aortic aneurysm, without rupture, unspecified: Secondary | ICD-10-CM

## 2016-06-06 NOTE — Progress Notes (Signed)
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 female, who is seen today for discussion of her aortic and right common iliac aneurysm. This has been followed with serial ultrasound is seeing me today for further discussion of this with some slight growth over time. In looking back to prior CT scan from 2010, the patient did have ectasia of her aorta that time with a maximal diameter of her aorta 2.9 cm and her right common iliac artery at 1.6 cm. She has no symptoms referable to her aneurysm. Her sister did undergo emergent repair of a symptomatic aneurysm 3 years ago with Dr. Myra Gianotti. She does have many medical issues including multiple sclerosis. She does have COPD but it's quit smoking cigarettes several years ago.  Past Medical History:  Diagnosis Date  . Allergic rhinitis   . Chronic hoarseness   . Colon polyps   . COPD (chronic obstructive pulmonary disease) (HCC)    Dr. Marchelle Gearing  . Dyslipidemia   . Hypertension   . Hypothyroidism   . MS (multiple sclerosis) (HCC)   . Obesity   . Stomach ulcer from aspirin/ibuprofen-like drugs (NSAID's)     Family History  Problem Relation Age of Onset  . COPD Father   . Heart disease Father   . COPD Sister   . Arthritis Sister   . AAA (abdominal aortic aneurysm) Sister   . Dementia Sister   . Other Sister     brain injury  . Stroke Mother 50  . Heart disease Mother     congenital heart defect?    SOCIAL HISTORY: Social History   Social History  . Marital status: Married    Spouse name: Cassandra Rios  . Number of children: 2  . Years of education: 16   Occupational History  . 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 Billing [employer]   Social History Main  Topics  . Smoking status: Former Smoker    Packs/day: 2.00    Years: 35.00    Types: Cigarettes    Quit date: 04/05/2007  . Smokeless tobacco: Never Used  . Alcohol use No     Comment: very rare  . Drug use: No  . Sexual activity: Yes    Partners: Male   Other Topics Concern  . Not on file   Social History Narrative   Patient is right handed,reside in home with husband    Allergies  Allergen Reactions  . Codeine   . Hydromorphone Hcl     REACTION: nausea/vomiting  . Ibuprofen     REACTION: stomach ulcers  . Morphine Sulfate     Current Outpatient Prescriptions  Medication Sig Dispense Refill  . aspirin 81 MG tablet Take 81 mg by mouth daily.    Marland Kitchen atorvastatin (LIPITOR) 20 MG tablet TAKE 1 TABLET (20 MG TOTAL) BY MOUTH DAILY. 90 tablet 1  . Calcium Carbonate-Vitamin D (CALCIUM 600 + D PO) Take 1 tablet by mouth daily.     . celecoxib (CELEBREX) 200 MG capsule Take 200 mg by mouth daily as needed (inflammation).    . conjugated estrogens (PREMARIN) vaginal cream Place 1 Applicatorful vaginally daily as needed (vaginal dryness).    . cyclobenzaprine (FLEXERIL) 10 MG tablet Take 1 tablet (10 mg total) by mouth 3 (three)  times daily as needed for muscle spasms. 30 tablet 0  . FLUoxetine (PROZAC) 40 MG capsule TAKE 1 CAPSULE (40 MG TOTAL) BY MOUTH DAILY. 90 capsule 1  . fluticasone-salmeterol (ADVAIR HFA) 115-21 MCG/ACT inhaler Inhale 2 puffs into the lungs 2 (two) times daily as needed (wheezing).    Marland Kitchen HYDROcodone-acetaminophen (NORCO/VICODIN) 5-325 MG per tablet Take 1 tablet by mouth every 6 (six) hours as needed for moderate pain.    . Interferon Beta-1b (BETASERON) 0.3 MG KIT injection Inject 0.25 mg into the skin every other day. 1 kit 6  . levocetirizine (XYZAL) 5 MG tablet Take 5 mg by mouth at bedtime as needed for allergies.    Marland Kitchen levothyroxine (SYNTHROID, LEVOTHROID) 125 MCG tablet Take 1 tablet (125 mcg total) by mouth daily. 90 tablet 0  .  losartan-hydrochlorothiazide (HYZAAR) 100-12.5 MG tablet Take 1 tablet by mouth daily. 90 tablet 1  . mometasone (NASONEX) 50 MCG/ACT nasal spray Place 2 sprays into the nose daily as needed (allergies).    . Multiple Vitamin (MULTIVITAMIN) tablet Take 1 tablet by mouth 2 (two) times daily.       No current facility-administered medications for this visit.     REVIEW OF SYSTEMS:  [X]  denotes positive finding, [ ]  denotes negative finding Cardiac  Comments:  Chest pain or chest pressure:    Shortness of breath upon exertion: x   Short of breath when lying flat:    Irregular heart rhythm:        Vascular    Pain in calf, thigh, or hip brought on by ambulation:    Pain in feet at night that wakes you up from your sleep:     Blood clot in your veins:    Leg swelling:         Pulmonary    Oxygen at home:    Productive cough:     Wheezing:         Neurologic    Sudden weakness in arms or legs:  x   Sudden numbness in arms or legs:     Sudden onset of difficulty speaking or slurred speech:    Temporary loss of vision in one eye:     Problems with dizziness:         Gastrointestinal    Blood in stool:     Vomited blood:         Genitourinary    Burning when urinating:     Blood in urine:        Psychiatric    Major depression:         Hematologic    Bleeding problems:    Problems with blood clotting too easily:        Skin    Rashes or ulcers:        Constitutional    Fever or chills:      PHYSICAL EXAM: Vitals:   06/06/16 1403  BP: (!) 153/90  Pulse: 82  Resp: 20  Temp: 97.4 F (36.3 C)  TempSrc: Oral  SpO2: 96%  Weight: 247 lb (112 kg)  Height: 5\' 6"  (1.676 m)    GENERAL: The patient is a well-nourished female, in no acute distress. The vital signs are documented above. CARDIOVASCULAR: Carotid arteries without bruits bilaterally. 2+ radial and 2-3+ dorsalis pedis pulses bilaterally PULMONARY: There is good air exchange  ABDOMEN: Soft and non-tender .  Moderate obesity and no aneurysm palpable MUSCULOSKELETAL: There are no major deformities or cyanosis. NEUROLOGIC: No focal  weakness or paresthesias are detected. SKIN: There are no ulcers or rashes noted. PSYCHIATRIC: The patient has a normal affect.  DATA:  CT scan from 2010 and ultrasound from 2 months ago. Maximal diameter of her aorta by ultrasound is 3.9 cm in maximal diameter of her right common iliac artery is 1.8 cm  MEDICAL ISSUES: Had long discussion with patient regarding the natural history of her aneurysmal disease. Explain that this is small and is showing very slow growth as documented with her CT scan from 2010. Explained that she should not have any restrictions regarding her activity related to this. Did explain the need for routine blood pressure control. Recommended that we see her in 2 years with repeat ultrasound. If she shows a growth we would change the interval to yearly. I reviewed symptoms of leaking aneurysm with the patient. I think this would be highly unlikely that the importance of calling 911 and presenting to the emergency room immediately should this occur   Larina Earthly, MD [facility] Vascular and Vein Specialists of [facility] Tel [phone] Pager [phone]

## 2016-06-07 ENCOUNTER — Encounter: Payer: PPO | Admitting: Vascular Surgery

## 2016-06-14 ENCOUNTER — Encounter: Payer: PPO | Admitting: Vascular Surgery

## 2016-06-14 ENCOUNTER — Encounter (HOSPITAL_COMMUNITY): Payer: PPO

## 2016-06-15 ENCOUNTER — Other Ambulatory Visit: Payer: Self-pay

## 2016-06-15 DIAGNOSIS — M47816 Spondylosis without myelopathy or radiculopathy, lumbar region: Secondary | ICD-10-CM | POA: Diagnosis not present

## 2016-06-15 DIAGNOSIS — M5136 Other intervertebral disc degeneration, lumbar region: Secondary | ICD-10-CM | POA: Diagnosis not present

## 2016-06-15 DIAGNOSIS — G35 Multiple sclerosis: Secondary | ICD-10-CM

## 2016-06-15 DIAGNOSIS — Z79891 Long term (current) use of opiate analgesic: Secondary | ICD-10-CM | POA: Diagnosis not present

## 2016-06-15 MED ORDER — INTERFERON BETA-1B 0.3 MG ~~LOC~~ KIT
0.2500 mg | PACK | SUBCUTANEOUS | 6 refills | Status: DC
Start: 2016-06-15 — End: 2016-12-20

## 2016-06-15 NOTE — Telephone Encounter (Signed)
Refills e-scribed per faxed request from pharmacy. 

## 2016-06-22 ENCOUNTER — Other Ambulatory Visit: Payer: Self-pay | Admitting: Family Medicine

## 2016-06-22 DIAGNOSIS — E039 Hypothyroidism, unspecified: Secondary | ICD-10-CM

## 2016-07-03 ENCOUNTER — Other Ambulatory Visit (INDEPENDENT_AMBULATORY_CARE_PROVIDER_SITE_OTHER): Payer: PPO

## 2016-07-03 ENCOUNTER — Other Ambulatory Visit: Payer: Self-pay

## 2016-07-03 ENCOUNTER — Telehealth: Payer: Self-pay | Admitting: Family Medicine

## 2016-07-03 DIAGNOSIS — E785 Hyperlipidemia, unspecified: Secondary | ICD-10-CM

## 2016-07-03 DIAGNOSIS — R3989 Other symptoms and signs involving the genitourinary system: Secondary | ICD-10-CM

## 2016-07-03 LAB — LIPID PANEL
CHOL/HDL RATIO: 4
Cholesterol: 176 mg/dL (ref 0–200)
HDL: 50.1 mg/dL (ref >39.00)
LDL CALC: 101 mg/dL — AB (ref 0–99)
NONHDL: 126.12
TRIGLYCERIDES: 126 mg/dL (ref 0.0–149.0)
VLDL: 25.2 mg/dL (ref 0.0–40.0)

## 2016-07-03 LAB — COMPREHENSIVE METABOLIC PANEL
ALK PHOS: 61 U/L (ref 39–117)
ALT: 22 U/L (ref 0–35)
AST: 24 U/L (ref 0–37)
Albumin: 3.8 g/dL (ref 3.5–5.2)
BILIRUBIN TOTAL: 0.5 mg/dL (ref 0.2–1.2)
BUN: 13 mg/dL (ref 6–23)
CALCIUM: 9.4 mg/dL (ref 8.4–10.5)
CO2: 29 meq/L (ref 19–32)
Chloride: 99 mEq/L (ref 96–112)
Creatinine, Ser: 1.01 mg/dL (ref 0.40–1.20)
GFR: 58.21 mL/min — AB (ref >60.00)
Glucose, Bld: 110 mg/dL — ABNORMAL HIGH (ref 70–99)
POTASSIUM: 4.1 meq/L (ref 3.5–5.1)
Sodium: 134 mEq/L — ABNORMAL LOW (ref 135–145)
Total Protein: 6.8 g/dL (ref 6.0–8.3)

## 2016-07-03 LAB — HEMOGLOBIN A1C: Hgb A1c MFr Bld: 5.8 % (ref 4.6–6.5)

## 2016-07-03 NOTE — Telephone Encounter (Signed)
Referral put in to ob/gyn  Patient notified.  PC

## 2016-07-03 NOTE — Telephone Encounter (Signed)
Caller name: Cassandra Rios  Relation to pt: self  Call back number: [phone] Pharmacy:  Reason for call: Pt states spoke with PCP about an issue an that she is needing a referral to OBGYN. (pt did not want to mention the issue) Pt informed that was using a cream that provider gave her but it is not working and that she is needing to be referred ASAP.

## 2016-07-17 ENCOUNTER — Ambulatory Visit (INDEPENDENT_AMBULATORY_CARE_PROVIDER_SITE_OTHER): Payer: PPO | Admitting: Family Medicine

## 2016-07-17 ENCOUNTER — Other Ambulatory Visit (HOSPITAL_COMMUNITY)
Admission: RE | Admit: 2016-07-17 | Discharge: 2016-07-17 | Disposition: A | Discharge status: Home / Self Care | Payer: PPO | Source: Ambulatory Visit | Attending: Family Medicine | Admitting: Family Medicine

## 2016-07-17 ENCOUNTER — Encounter: Payer: Self-pay | Admitting: Family Medicine

## 2016-07-17 VITALS — BP 143/85 | HR 70 | Ht 66.0 in | Wt 246.0 lb

## 2016-07-17 DIAGNOSIS — N898 Other specified noninflammatory disorders of vagina: Secondary | ICD-10-CM

## 2016-07-17 DIAGNOSIS — N9089 Other specified noninflammatory disorders of vulva and perineum: Secondary | ICD-10-CM

## 2016-07-17 MED ORDER — CLOBETASOL PROPIONATE 0.05 % EX OINT: 1.0000 "application " | TOPICAL_OINTMENT | Freq: Two times a day (BID) | CUTANEOUS | 3 refills | Status: DC

## 2016-07-17 NOTE — Progress Notes (Signed)
Patient states that left side of labia is sore and burning. Patient first noticed it 3 months ago. Kathrene Alu RN BSN

## 2016-07-17 NOTE — Patient Instructions (Signed)
[place] at [facility]: [phone]

## 2016-07-17 NOTE — Progress Notes (Signed)
Subjective:    Patient ID: Cassandra Rios, female    DOB: Apr 03, 1951, 66 y.o.   MRN: [id number]  HPI 66 year old patient seen for left labial burning pain with a past 4-5 months. Pain has been fairly constant, worse with urination. Frequently patient has itching - uses toilet paper to rub the area, but tries not to scratch. Has not noticed any discharge. Patient was evaluated by her primary care physician, who noted a left lower labial abrasion and started her on Premarin for one month. As this did not improve, the patient stopped in and patient was sent here for consultation.  He denies history of hysterectomy - she believes that she still has a cervix. No vaginal bleeding. Has yearly mammograms.  I have reviewed the patients past medical, family, and social history.  I have reviewed the patient's medication list and allergies.  Review of Systems     Objective:   Physical Exam  Constitutional: She appears well-developed and well-nourished.  HENT:  Head: Normocephalic and atraumatic.  Neck: Normal range of motion. Neck supple. No thyromegaly present.  Cardiovascular: Normal rate, regular rhythm and normal heart sounds.   Pulmonary/Chest: Effort normal and breath sounds normal.  Genitourinary:    There is tenderness on the right labia. There is no injury on the right labia. There is tenderness on the left labia. There is no lesion or injury on the left labia.  Genitourinary Comments: Labial irritation bilaterally. Small amount of clumpy white discharge surrounding clitoris and on upper labia  Skin: Skin is warm and dry. No rash noted. No erythema. No pallor.  Psychiatric: She has a normal mood and affect. Her behavior is normal. Judgment and thought content normal.       Assessment & Plan:  1. Vulvar irritation Uncertain of underlying cause of patient's discomfort - possibly early lichen? No clear area of lichen. Will start the patient on clobetasol to remove irritation. Areas  of clumpy white discharge possible yeast  - culture obtained. If positive for yeast, will treat with diflucan. Will treat for 2-3 months, then reevaluate. If not improving or if worsening, will biopsy off steroids.  2. Vaginal discharge  - Cervicovaginal ancillary only

## 2016-07-18 LAB — CERVICOVAGINAL ANCILLARY ONLY
Bacterial vaginitis: NEGATIVE
Candida vaginitis: NEGATIVE

## 2016-09-05 ENCOUNTER — Ambulatory Visit (INDEPENDENT_AMBULATORY_CARE_PROVIDER_SITE_OTHER): Payer: PPO | Admitting: Adult Health

## 2016-09-05 ENCOUNTER — Encounter (INDEPENDENT_AMBULATORY_CARE_PROVIDER_SITE_OTHER): Payer: Self-pay

## 2016-09-05 ENCOUNTER — Encounter: Payer: Self-pay | Admitting: Adult Health

## 2016-09-05 VITALS — BP 142/57 | HR 69 | Resp 29 | Ht 66.0 in | Wt 248.0 lb

## 2016-09-05 DIAGNOSIS — Z5181 Encounter for therapeutic drug level monitoring: Secondary | ICD-10-CM | POA: Diagnosis not present

## 2016-09-05 DIAGNOSIS — G35 Multiple sclerosis: Secondary | ICD-10-CM

## 2016-09-05 NOTE — Patient Instructions (Signed)
Continue Betaseron Blood work today MRI brain If your symptoms worsen or you develop new symptoms please let us know.

## 2016-09-05 NOTE — Progress Notes (Signed)
PATIENT: Cassandra Rios DOB: 01-04-1951  REASON FOR VISIT: follow up- multiple Sclerosis HISTORY FROM: patient  HISTORY OF PRESENT ILLNESS: Cassandra Rios is a 66 year old female with a history of multiple sclerosis. She returns today for follow-up. Overall she feels that she has remained stable. She remains on Betaseron. Denies any significant changes with her gait or balance. She states that over the years she does feel that her balance has gotten slightly worse. She denies any falls. Denies any new numbness or weakness. She does state again that over the years her grip strength has gotten worse. She does find that she drops things frequently. Denies any changes with the bowels or bladder. She does note constipation if she takes her pain medication. She states that she has an appointment at the end of the month to have her vision checked. Reports that there has been some changes that she feels are related to cataracts. She returns today for an evaluation.  HISTORY 09/06/15: Cassandra Rios is a 66 year old right-handed white female with a history of multiple sclerosis, obesity, and tobacco abuse. The patient has retired, but she has remained relatively inactive in part because of chronic low back pain. The patient is on Betaseron, she has done well with this. She has not had any new symptoms related to MS since last seen 6 months ago. The patient has had blood work in the fall of 2016 to include a CBC and a comprehensive metabolic profile. The patient is having some slight worsening of her vision, she has cataracts and she will be seeing her ophthalmologist in the near future. She denies any new numbness, weakness, balance changes, or difficulty controlling the bowels or the bladder. She returns for an evaluation.   REVIEW OF SYSTEMS: Out of a complete 14 system review of symptoms, the patient complains only of the following symptoms, and all other reviewed systems are negative.  Fatigue, eye  discharge, eye itching, eye pain, constipation, joint pain, back pain, headache  ALLERGIES: Allergies  Allergen Reactions  . Codeine   . Hydromorphone Hcl     REACTION: nausea/vomiting  . Ibuprofen     REACTION: stomach ulcers  . Morphine Sulfate     HOME MEDICATIONS: Outpatient Medications Prior to Visit  Medication Sig Dispense Refill  . aspirin 81 MG tablet Take 81 mg by mouth daily.    Marland Kitchen atorvastatin (LIPITOR) 20 MG tablet TAKE 1 TABLET (20 MG TOTAL) BY MOUTH DAILY. 90 tablet 1  . Calcium Carbonate-Vitamin D (CALCIUM 600 + D PO) Take 1 tablet by mouth daily.     . celecoxib (CELEBREX) 200 MG capsule Take 200 mg by mouth daily as needed (inflammation).    . clobetasol ointment (TEMOVATE) 0.05 % Apply 1 application topically 2 (two) times daily. Apply to affected area 30 g 3  . cyclobenzaprine (FLEXERIL) 10 MG tablet Take 1 tablet (10 mg total) by mouth 3 (three) times daily as needed for muscle spasms. 30 tablet 0  . FLUoxetine (PROZAC) 40 MG capsule TAKE 1 CAPSULE (40 MG TOTAL) BY MOUTH DAILY. 90 capsule 1  . HYDROcodone-acetaminophen (NORCO/VICODIN) 5-325 MG per tablet Take 1 tablet by mouth every 6 (six) hours as needed for moderate pain.    . Interferon Beta-1b (BETASERON) 0.3 MG KIT injection Inject 0.25 mg into the skin every other day. 1 kit 6  . levocetirizine (XYZAL) 5 MG tablet Take 5 mg by mouth at bedtime as needed for allergies.    Marland Kitchen levothyroxine (SYNTHROID, LEVOTHROID)  PATIENT: Cassandra Rios DOB: 1951/01/13  REASON FOR VISIT: follow up- multiple Sclerosis HISTORY FROM: patient  HISTORY OF PRESENT ILLNESS: Cassandra Rios is a 66 year old female with a history of multiple sclerosis. She returns today for follow-up. Overall she feels that she has remained stable. She remains on Betaseron. Denies any significant changes with her gait or balance. She states that over the years she does feel that her balance has gotten slightly worse. She denies any falls. Denies any new numbness or weakness. She does state again that over the years her grip strength has gotten worse. She does find that she drops things frequently. Denies any changes with the bowels or bladder. She does note constipation if she takes her pain medication. She states that she has an appointment at the end of the month to have her vision checked. Reports that there has been some changes that she feels are related to cataracts. She returns today for an evaluation.  HISTORY 09/06/15: Cassandra Rios is a 66 year old right-handed white female with a history of multiple sclerosis, obesity, and tobacco abuse. The patient has retired, but she has remained relatively inactive in part because of chronic low back pain. The patient is on Betaseron, she has done well with this. She has not had any new symptoms related to MS since last seen 6 months ago. The patient has had blood work in the fall of 2016 to include a CBC and a comprehensive metabolic profile. The patient is having some slight worsening of her vision, she has cataracts and she will be seeing her ophthalmologist in the near future. She denies any new numbness, weakness, balance changes, or difficulty controlling the bowels or the bladder. She returns for an evaluation.   REVIEW OF SYSTEMS: Out of a complete 14 system review of symptoms, the patient complains only of the following symptoms, and all other reviewed systems are negative.  Fatigue, eye  discharge, eye itching, eye pain, constipation, joint pain, back pain, headache  ALLERGIES: Allergies  Allergen Reactions  . Codeine   . Hydromorphone Hcl     REACTION: nausea/vomiting  . Ibuprofen     REACTION: stomach ulcers  . Morphine Sulfate     HOME MEDICATIONS: Outpatient Medications Prior to Visit  Medication Sig Dispense Refill  . aspirin 81 MG tablet Take 81 mg by mouth daily.    Marland Kitchen atorvastatin (LIPITOR) 20 MG tablet TAKE 1 TABLET (20 MG TOTAL) BY MOUTH DAILY. 90 tablet 1  . Calcium Carbonate-Vitamin D (CALCIUM 600 + D PO) Take 1 tablet by mouth daily.     . celecoxib (CELEBREX) 200 MG capsule Take 200 mg by mouth daily as needed (inflammation).    . clobetasol ointment (TEMOVATE) 9.67 % Apply 1 application topically 2 (two) times daily. Apply to affected area 30 g 3  . cyclobenzaprine (FLEXERIL) 10 MG tablet Take 1 tablet (10 mg total) by mouth 3 (three) times daily as needed for muscle spasms. 30 tablet 0  . FLUoxetine (PROZAC) 40 MG capsule TAKE 1 CAPSULE (40 MG TOTAL) BY MOUTH DAILY. 90 capsule 1  . HYDROcodone-acetaminophen (NORCO/VICODIN) 5-325 MG per tablet Take 1 tablet by mouth every 6 (six) hours as needed for moderate pain.    . Interferon Beta-1b (BETASERON) 0.3 MG KIT injection Inject 0.25 mg into the skin every other day. 1 kit 6  . levocetirizine (XYZAL) 5 MG tablet Take 5 mg by mouth at bedtime as needed for allergies.    Marland Kitchen levothyroxine (SYNTHROID, LEVOTHROID)  PATIENT: Cassandra Rios DOB: 1951/01/13  REASON FOR VISIT: follow up- multiple Sclerosis HISTORY FROM: patient  HISTORY OF PRESENT ILLNESS: Cassandra Rios is a 66 year old female with a history of multiple sclerosis. She returns today for follow-up. Overall she feels that she has remained stable. She remains on Betaseron. Denies any significant changes with her gait or balance. She states that over the years she does feel that her balance has gotten slightly worse. She denies any falls. Denies any new numbness or weakness. She does state again that over the years her grip strength has gotten worse. She does find that she drops things frequently. Denies any changes with the bowels or bladder. She does note constipation if she takes her pain medication. She states that she has an appointment at the end of the month to have her vision checked. Reports that there has been some changes that she feels are related to cataracts. She returns today for an evaluation.  HISTORY 09/06/15: Cassandra Rios is a 66 year old right-handed white female with a history of multiple sclerosis, obesity, and tobacco abuse. The patient has retired, but she has remained relatively inactive in part because of chronic low back pain. The patient is on Betaseron, she has done well with this. She has not had any new symptoms related to MS since last seen 6 months ago. The patient has had blood work in the fall of 2016 to include a CBC and a comprehensive metabolic profile. The patient is having some slight worsening of her vision, she has cataracts and she will be seeing her ophthalmologist in the near future. She denies any new numbness, weakness, balance changes, or difficulty controlling the bowels or the bladder. She returns for an evaluation.   REVIEW OF SYSTEMS: Out of a complete 14 system review of symptoms, the patient complains only of the following symptoms, and all other reviewed systems are negative.  Fatigue, eye  discharge, eye itching, eye pain, constipation, joint pain, back pain, headache  ALLERGIES: Allergies  Allergen Reactions  . Codeine   . Hydromorphone Hcl     REACTION: nausea/vomiting  . Ibuprofen     REACTION: stomach ulcers  . Morphine Sulfate     HOME MEDICATIONS: Outpatient Medications Prior to Visit  Medication Sig Dispense Refill  . aspirin 81 MG tablet Take 81 mg by mouth daily.    Marland Kitchen atorvastatin (LIPITOR) 20 MG tablet TAKE 1 TABLET (20 MG TOTAL) BY MOUTH DAILY. 90 tablet 1  . Calcium Carbonate-Vitamin D (CALCIUM 600 + D PO) Take 1 tablet by mouth daily.     . celecoxib (CELEBREX) 200 MG capsule Take 200 mg by mouth daily as needed (inflammation).    . clobetasol ointment (TEMOVATE) 9.67 % Apply 1 application topically 2 (two) times daily. Apply to affected area 30 g 3  . cyclobenzaprine (FLEXERIL) 10 MG tablet Take 1 tablet (10 mg total) by mouth 3 (three) times daily as needed for muscle spasms. 30 tablet 0  . FLUoxetine (PROZAC) 40 MG capsule TAKE 1 CAPSULE (40 MG TOTAL) BY MOUTH DAILY. 90 capsule 1  . HYDROcodone-acetaminophen (NORCO/VICODIN) 5-325 MG per tablet Take 1 tablet by mouth every 6 (six) hours as needed for moderate pain.    . Interferon Beta-1b (BETASERON) 0.3 MG KIT injection Inject 0.25 mg into the skin every other day. 1 kit 6  . levocetirizine (XYZAL) 5 MG tablet Take 5 mg by mouth at bedtime as needed for allergies.    Marland Kitchen levothyroxine (SYNTHROID, LEVOTHROID)

## 2016-09-05 NOTE — Progress Notes (Signed)
I have read the note, and I agree with the clinical assessment and plan.  WILLIS,CHARLES KEITH   

## 2016-09-06 ENCOUNTER — Telehealth: Payer: Self-pay | Admitting: Adult Health

## 2016-09-06 LAB — CBC WITH DIFFERENTIAL/PLATELET
BASOS: 0 %
Basophils Absolute: 0 10*3/uL (ref 0.0–0.2)
EOS (ABSOLUTE): 0.1 10*3/uL (ref 0.0–0.4)
Eos: 1 %
HEMOGLOBIN: 14.3 g/dL (ref 11.1–15.9)
Hematocrit: 42.2 % (ref 34.0–46.6)
IMMATURE GRANS (ABS): 0 10*3/uL (ref 0.0–0.1)
Immature Granulocytes: 0 %
LYMPHS: 25 %
Lymphocytes Absolute: 1.6 10*3/uL (ref 0.7–3.1)
MCH: 30.2 pg (ref 26.6–33.0)
MCHC: 33.9 g/dL (ref 31.5–35.7)
MCV: 89 fL (ref 79–97)
Monocytes Absolute: 0.8 10*3/uL (ref 0.1–0.9)
Monocytes: 12 %
Neutrophils Absolute: 4 10*3/uL (ref 1.4–7.0)
Neutrophils: 62 %
PLATELETS: 125 10*3/uL — AB (ref 150–379)
RBC: 4.73 x10E6/uL (ref 3.77–5.28)
RDW: 13.7 % (ref 12.3–15.4)
WBC: 6.4 10*3/uL (ref 3.4–10.8)

## 2016-09-06 LAB — COMPREHENSIVE METABOLIC PANEL
A/G RATIO: 1.5 (ref 1.2–2.2)
ALBUMIN: 4 g/dL (ref 3.6–4.8)
ALT: 23 IU/L (ref 0–32)
AST: 23 IU/L (ref 0–40)
Alkaline Phosphatase: 86 IU/L (ref 39–117)
BUN/Creatinine Ratio: 14 (ref 12–28)
BUN: 13 mg/dL (ref 8–27)
Bilirubin Total: 0.4 mg/dL (ref 0.0–1.2)
CALCIUM: 10 mg/dL (ref 8.7–10.3)
CO2: 27 mmol/L (ref 18–29)
Chloride: 100 mmol/L (ref 96–106)
Creatinine, Ser: 0.9 mg/dL (ref 0.57–1.00)
GFR, EST AFRICAN AMERICAN: 77 mL/min/{1.73_m2} (ref >59)
GFR, EST NON AFRICAN AMERICAN: 67 mL/min/{1.73_m2} (ref >59)
GLOBULIN, TOTAL: 2.7 g/dL (ref 1.5–4.5)
Glucose: 98 mg/dL (ref 65–99)
POTASSIUM: 5.1 mmol/L (ref 3.5–5.2)
Sodium: 139 mmol/L (ref 134–144)
TOTAL PROTEIN: 6.7 g/dL (ref 6.0–8.5)

## 2016-09-06 NOTE — Telephone Encounter (Signed)
I called the patient. Her platelet count is low. She states that in the past with her low platelets Dr. Marisue Humble told her it was because the blood was spun when being tested. She reports that he tested the blood himself in the past and reported her platelet count was normal. She is having a physical at the end of the month and will have her labs repeated at that time. I advised that I will check her platelet count at that time if it decreases further we will discuss her medication.

## 2016-09-20 NOTE — Progress Notes (Signed)
Subjective:   Cassandra Rios is a 66 y.o. female who presents for an Initial Medicare Annual Wellness Visit.  Review of Systems    No ROS.  Medicare Wellness Visit. Cardiac Risk Factors include: advanced age (>47mn, >>17women);hypertension;sedentary lifestyle;obesity (BMI >30kg/m2) Sleep patterns: Wakes occasionally to urinate.  Sleeps 7-9 hrs per night. Naps if needed.Sometimes takes tylenol PM. Home Safety/Smoke Alarms: Feels safe in home. Smoke alarms in place.  Living environment; residence and Firearm Safety: Lives with husband in 1 story home. Guns safely stored. Seat Belt Safety/Bike Helmet: Wears seat belt.   Counseling:   Eye Exam- Trifocals. Dr.Tanner --appt today. Goes yearly. Dental- Dr.Bartlett every 6 months.  Female:   Pap- Hysterectomy      Mammo- Last 03/21/16: BI-RADS CATEGORY  1: Negative.       Dexa scan- Last 07/22/12: Osteopenia. Pt states she will schedule at a later date-declines today. CCS- Last 05/18/14: Polyps removed. No f/u on file.    Objective:    Today's Vitals   09/25/16 0836  BP: 130/78  Pulse: (!) 58  Resp: 16  Temp: 97.5 F (36.4 C)  TempSrc: Oral  SpO2: 98%  Weight: 249 lb (112.9 kg)  Height: 5' 6"  (1.676 m)   Body mass index is 40.19 kg/m.   Current Medications (verified) Outpatient Encounter Prescriptions as of 09/25/2016  Medication Sig  . aspirin 81 MG tablet Take 81 mg by mouth daily.  .Marland Kitchenatorvastatin (LIPITOR) 20 MG tablet TAKE 1 TABLET (20 MG TOTAL) BY MOUTH DAILY.  . Calcium Carbonate-Vitamin D (CALCIUM 600 + D PO) Take 1 tablet by mouth daily.   . celecoxib (CELEBREX) 200 MG capsule Take 200 mg by mouth daily as needed (inflammation).  . clobetasol ointment (TEMOVATE) 05.46% Apply 1 application topically 2 (two) times daily. Apply to affected area  . cyclobenzaprine (FLEXERIL) 10 MG tablet Take 1 tablet (10 mg total) by mouth 3 (three) times daily as needed for muscle spasms.  .Marland KitchenFLUoxetine (PROZAC) 40 MG capsule TAKE  1 CAPSULE (40 MG TOTAL) BY MOUTH DAILY.  .Marland KitchenHYDROcodone-acetaminophen (NORCO/VICODIN) 5-325 MG per tablet Take 1 tablet by mouth every 6 (six) hours as needed for moderate pain.  . Interferon Beta-1b (BETASERON) 0.3 MG KIT injection Inject 0.25 mg into the skin every other day.  . levocetirizine (XYZAL) 5 MG tablet Take 5 mg by mouth at bedtime as needed for allergies.  .Marland Kitchenlevothyroxine (SYNTHROID, LEVOTHROID) 125 MCG tablet TAKE 1 TABLET EVERY DAY  . losartan-hydrochlorothiazide (HYZAAR) 100-12.5 MG tablet Take 1 tablet by mouth daily.  . mometasone (NASONEX) 50 MCG/ACT nasal spray Place 2 sprays into the nose daily as needed (allergies).  . Multiple Vitamin (MULTIVITAMIN) tablet Take 1 tablet by mouth 2 (two) times daily.    . [DISCONTINUED] cyclobenzaprine (FLEXERIL) 10 MG tablet Take 1 tablet (10 mg total) by mouth 3 (three) times daily as needed for muscle spasms.   No facility-administered encounter medications on file as of 09/25/2016.     Allergies (verified) Codeine; Hydromorphone hcl; Ibuprofen; and Morphine sulfate   History: Past Medical History:  Diagnosis Date  . AAA (abdominal aortic aneurysm) ([facility]   . Allergic rhinitis   . Chronic hoarseness   . Colon polyps   . COPD (chronic obstructive pulmonary disease) (HCC)    Dr. RChase Caller . Dyslipidemia   . Hypertension   . Hypothyroidism   . MS (multiple sclerosis) ([facility]   . Obesity   . Stomach ulcer from aspirin/ibuprofen-like drugs (  NSAID's)    Past Surgical History:  Procedure Laterality Date  . ABDOMINAL HYSTERECTOMY    . BARIATRIC SURGERY  09/07/08   lap band  . CARPAL TUNNEL RELEASE    . CHOLECYSTECTOMY    . CHOLECYSTECTOMY    . HIATAL HERNIA REPAIR  09/07/08   Family History  Problem Relation Age of Onset  . COPD Father   . Heart disease Father   . AAA (abdominal aortic aneurysm) Father   . COPD Sister   . Arthritis Sister   . AAA (abdominal aortic aneurysm) Sister   . Dementia Sister   . Other Sister      brain injury  . Stroke Mother 77  . Heart disease Mother     congenital heart defect?   Social History   Occupational History  . retired    Social History Main Topics  . Smoking status: Former Smoker    Packs/day: 2.00    Years: 35.00    Types: Cigarettes    Quit date: 04/05/2007  . Smokeless tobacco: Never Used  . Alcohol use No     Comment: very rare  . Drug use: No  . Sexual activity: No    Tobacco Counseling Counseling given: Not Answered   Activities of Daily Living In your present state of health, do you have any difficulty performing the following activities: 09/25/2016  Hearing? N  Vision? N  Difficulty concentrating or making decisions? N  Walking or climbing stairs? Y  Dressing or bathing? N  Doing errands, shopping? N  Preparing Food and eating ? N  Using the Toilet? N  In the past six months, have you accidently leaked urine? N  Do you have problems with loss of bowel control? N  Managing your Medications? N  Managing your Finances? N  Housekeeping or managing your Housekeeping? N  Some recent data might be hidden    Immunizations and Health Maintenance Immunization History  Administered Date(s) Administered  . H1N1 07/20/2008  . Influenza Whole 02/26/2008, 03/04/2010, 02/27/2013  . Influenza, High Dose Seasonal PF 04/01/2015, 03/27/2016  . Pneumococcal Conjugate-13 04/01/2015  . Pneumococcal Polysaccharide-23 07/20/2008, 03/07/2011  . Tdap 03/07/2011  . Zoster 02/28/2010   There are no preventive care reminders to display for this patient.  Patient Care Team: Ann Held, DO as PCP - General  Indicate any recent Medical Services you may have received from other than [facility] providers in the past year (date may be approximate).     Assessment:   This is a routine wellness examination for Cassandra Rios. Physical assessment deferred to PCP.   Hearing/Vision screen  Visual Acuity Screening   Right eye Left eye Both eyes  Without  correction:     With correction: 20/25 20/25 20/20   Hearing Screening Comments: Able to hear conversational tones w/o difficulty. No issues reported.    Dietary issues and exercise activities discussed: Current Exercise Habits: The patient does not participate in regular exercise at present, Exercise limited by: None identified   Diet (meal preparation, eat out, water intake, caffeinated beverages, dairy products, fruits and vegetables): on average, 3 meals per day  24HOUR RECALL Breakfast: bagel Lunch: meat and vegetable Dinner:   Tossed salad Snack-oatmeal pie, chocolate Drinks 2 glasses of water per day.  Goals    . Begin exercising again          2x/week for [facility]mn @ [facility] Scores 09/25/2016 03/27/2016 03/23/2015  PHQ -  2 Score 0 0 0    Fall Risk Fall Risk  09/25/2016 03/27/2016 03/23/2015  Falls in the past year? No No No    Cognitive Function: MMSE - Mini Mental State Exam 09/25/2016  Orientation to time 5  Orientation to Place 5  Registration 3  Attention/ Calculation 5  Recall 3  Language- name 2 objects 2  Language- repeat 1  Language- follow 3 step command 3  Language- read & follow direction 1  Write a sentence 1  Copy design 1  Total score 30        Screening Tests Health Maintenance  Topic Date Due  . PNA vac Low Risk Adult (2 of 2 - PPSV23) 10/25/2016 (Originally 03/31/2016)  . INFLUENZA VACCINE  01/03/2017  . MAMMOGRAM  03/21/2017  . COLONOSCOPY  05/18/2017  . TETANUS/TDAP  03/06/2021  . DEXA SCAN  Completed  . Hepatitis C Screening  Completed      Plan:   Follow up with PCP as directed.  Continue to eat heart healthy diet (full of fruits, vegetables, whole grains, lean protein, water--limit salt, fat, and sugar intake) and increase physical activity as tolerated.  Continue doing brain stimulating activities (puzzles, reading, adult coloring books, staying active) to keep memory sharp.   Bring a  copy of your advance directives to your next office visit.   During the course of the visit, Cassandra Rios was educated and counseled about the following appropriate screening and preventive services:   Vaccines to include Pneumoccal, Influenza, Td,  HCV  Cardiovascular disease screening  Colorectal cancer screening  Bone density screening  Diabetes screening  Glaucoma screening  Mammography/PAP  Nutrition counseling   Patient Instructions (the written plan) were given to the patient.    Naaman Plummer [place], [place]   09/25/2016

## 2016-09-20 NOTE — Progress Notes (Signed)
Pre visit review using our clinic review tool, if applicable. No additional management support is needed unless otherwise documented below in the visit note. 

## 2016-09-25 ENCOUNTER — Encounter: Payer: Self-pay | Admitting: Family Medicine

## 2016-09-25 ENCOUNTER — Ambulatory Visit (INDEPENDENT_AMBULATORY_CARE_PROVIDER_SITE_OTHER): Payer: PPO | Admitting: Family Medicine

## 2016-09-25 VITALS — BP 130/78 | HR 58 | Temp 97.5°F | Resp 16 | Ht 66.0 in | Wt 249.0 lb

## 2016-09-25 DIAGNOSIS — H43813 Vitreous degeneration, bilateral: Secondary | ICD-10-CM | POA: Diagnosis not present

## 2016-09-25 DIAGNOSIS — G35D Multiple sclerosis, unspecified: Secondary | ICD-10-CM

## 2016-09-25 DIAGNOSIS — Z0001 Encounter for general adult medical examination with abnormal findings: Secondary | ICD-10-CM

## 2016-09-25 DIAGNOSIS — E785 Hyperlipidemia, unspecified: Secondary | ICD-10-CM | POA: Diagnosis not present

## 2016-09-25 DIAGNOSIS — R739 Hyperglycemia, unspecified: Secondary | ICD-10-CM

## 2016-09-25 DIAGNOSIS — E039 Hypothyroidism, unspecified: Secondary | ICD-10-CM

## 2016-09-25 DIAGNOSIS — G35 Multiple sclerosis: Secondary | ICD-10-CM | POA: Diagnosis not present

## 2016-09-25 DIAGNOSIS — M546 Pain in thoracic spine: Secondary | ICD-10-CM

## 2016-09-25 DIAGNOSIS — I10 Essential (primary) hypertension: Secondary | ICD-10-CM | POA: Diagnosis not present

## 2016-09-25 DIAGNOSIS — H2513 Age-related nuclear cataract, bilateral: Secondary | ICD-10-CM | POA: Diagnosis not present

## 2016-09-25 DIAGNOSIS — S76011A Strain of muscle, fascia and tendon of right hip, initial encounter: Secondary | ICD-10-CM | POA: Diagnosis not present

## 2016-09-25 DIAGNOSIS — H04123 Dry eye syndrome of bilateral lacrimal glands: Secondary | ICD-10-CM | POA: Diagnosis not present

## 2016-09-25 DIAGNOSIS — Z0000 Encounter for general adult medical examination without abnormal findings: Secondary | ICD-10-CM

## 2016-09-25 DIAGNOSIS — H524 Presbyopia: Secondary | ICD-10-CM | POA: Diagnosis not present

## 2016-09-25 LAB — CBC WITH DIFFERENTIAL/PLATELET
BASOS PCT: 0.5 % (ref 0.0–3.0)
Basophils Absolute: 0 10*3/uL (ref 0.0–0.1)
EOS PCT: 1.4 % (ref 0.0–5.0)
Eosinophils Absolute: 0.1 10*3/uL (ref 0.0–0.7)
HCT: 42.5 % (ref 36.0–46.0)
Hemoglobin: 14.1 g/dL (ref 12.0–15.0)
LYMPHS ABS: 1.9 10*3/uL (ref 0.7–4.0)
Lymphocytes Relative: 28 % (ref 12.0–46.0)
MCHC: 33.1 g/dL (ref 30.0–36.0)
MCV: 91.4 fl (ref 78.0–100.0)
MONO ABS: 0.6 10*3/uL (ref 0.1–1.0)
Monocytes Relative: 8.8 % (ref 3.0–12.0)
NEUTROS ABS: 4.2 10*3/uL (ref 1.4–7.7)
NEUTROS PCT: 61.3 % (ref 43.0–77.0)
PLATELETS: 157 10*3/uL (ref 150.0–400.0)
RBC: 4.65 Mil/uL (ref 3.87–5.11)
RDW: 13.1 % (ref 11.5–15.5)
WBC: 6.8 10*3/uL (ref 4.0–10.5)

## 2016-09-25 LAB — COMPREHENSIVE METABOLIC PANEL
ALBUMIN: 3.7 g/dL (ref 3.5–5.2)
ALT: 17 U/L (ref 0–35)
AST: 20 U/L (ref 0–37)
Alkaline Phosphatase: 64 U/L (ref 39–117)
BUN: 11 mg/dL (ref 6–23)
CALCIUM: 9.5 mg/dL (ref 8.4–10.5)
CO2: 29 mEq/L (ref 19–32)
CREATININE: 0.92 mg/dL (ref 0.40–1.20)
Chloride: 98 mEq/L (ref 96–112)
GFR: 64.79 mL/min (ref >60.00)
Glucose, Bld: 96 mg/dL (ref 70–99)
POTASSIUM: 3.9 meq/L (ref 3.5–5.1)
Sodium: 133 mEq/L — ABNORMAL LOW (ref 135–145)
Total Bilirubin: 0.4 mg/dL (ref 0.2–1.2)
Total Protein: 6.6 g/dL (ref 6.0–8.3)

## 2016-09-25 LAB — LIPID PANEL
Cholesterol: 158 mg/dL (ref 0–200)
HDL: 57.1 mg/dL (ref >39.00)
LDL CALC: 83 mg/dL (ref 0–99)
NonHDL: 100.59
TRIGLYCERIDES: 90 mg/dL (ref 0.0–149.0)
Total CHOL/HDL Ratio: 3
VLDL: 18 mg/dL (ref 0.0–40.0)

## 2016-09-25 LAB — TSH: TSH: 4.05 u[IU]/mL (ref 0.35–4.50)

## 2016-09-25 LAB — HEMOGLOBIN A1C: HEMOGLOBIN A1C: 5.9 % (ref 4.6–6.5)

## 2016-09-25 MED ORDER — CYCLOBENZAPRINE HCL 10 MG PO TABS: 10.0000 mg | ORAL_TABLET | Freq: Three times a day (TID) | ORAL | 0 refills | Status: DC | PRN

## 2016-09-25 NOTE — Progress Notes (Addendum)
Patient ID: Cassandra Rios, female   DOB: 22-Apr-1951, 66 y.o.   MRN: [id number]  I acted as a Neurosurgeon for Dr. Zola Button.  Apolonio Schneiders, CMA  Subjective:     Cassandra Rios is a 66 y.o. female and is here for a comprehensive physical exam. The patient reports problems - right hip pain since yesterday.  It started yesterday, she think she may have pulled something.  she was working in the yard. Pain is with weight bearing only.   Pt also needs f/u bp and thyroid with some med refills.  No more complaints Social History   Social History  . Marital status: Married    Spouse name: Casimiro Needle  . Number of children: 2  . Years of education: 16   Occupational History  . retired    Social History Main Topics  . Smoking status: Former Smoker    Packs/day: 2.00    Years: 35.00    Types: Cigarettes    Quit date: 04/05/2007  . Smokeless tobacco: Never Used  . Alcohol use No     Comment: very rare  . Drug use: No  . Sexual activity: Yes    Partners: Male   Other Topics Concern  . Not on file   Social History Narrative   Patient is right handed,reside in home with husband   Health Maintenance  Topic Date Due  . PNA vac Low Risk Adult (2 of 2 - PPSV23) 03/31/2016  . INFLUENZA VACCINE  01/03/2017  . MAMMOGRAM  03/21/2017  . COLONOSCOPY  05/18/2017  . TETANUS/TDAP  03/06/2021  . DEXA SCAN  Completed  . Hepatitis C Screening  Completed    The following portions of the patient's history were reviewed and updated as appropriate:  She  has a past medical history of AAA (abdominal aortic aneurysm) (HCC); Allergic rhinitis; Chronic hoarseness; Colon polyps; COPD (chronic obstructive pulmonary disease) (HCC); Dyslipidemia; Hypertension; Hypothyroidism; MS (multiple sclerosis) (HCC); Obesity; and Stomach ulcer from aspirin/ibuprofen-like drugs (NSAID's). She  does not have any pertinent problems on file. She  has a past surgical history that includes Cholecystectomy; Carpal tunnel release;  Bariatric Surgery (09/07/08); Hiatal hernia repair (09/07/08); Abdominal hysterectomy; and Cholecystectomy. Her family history includes AAA (abdominal aortic aneurysm) in her father and sister; Arthritis in her sister; COPD in her father and sister; Dementia in her sister; Heart disease in her father and mother; Other in her sister; Stroke (age of onset: 76) in her mother. She  reports that she quit smoking about 9 years ago. Her smoking use included Cigarettes. She has a 70.00 pack-year smoking history. She has never used smokeless tobacco. She reports that she does not drink alcohol or use drugs. She has a current medication list which includes the following prescription(s): aspirin, atorvastatin, calcium carb-cholecalciferol, celecoxib, clobetasol ointment, cyclobenzaprine, fluoxetine, hydrocodone-acetaminophen, interferon beta-1b, levocetirizine, levothyroxine, losartan-hydrochlorothiazide, mometasone, and multivitamin. Current Outpatient Prescriptions on File Prior to Visit  Medication Sig Dispense Refill  . aspirin 81 MG tablet Take 81 mg by mouth daily.    Marland Kitchen atorvastatin (LIPITOR) 20 MG tablet TAKE 1 TABLET (20 MG TOTAL) BY MOUTH DAILY. 90 tablet 1  . Calcium Carbonate-Vitamin D (CALCIUM 600 + D PO) Take 1 tablet by mouth daily.     . celecoxib (CELEBREX) 200 MG capsule Take 200 mg by mouth daily as needed (inflammation).    . clobetasol ointment (TEMOVATE) 0.05 % Apply 1 application topically 2 (two) times daily. Apply to affected area 30 g 3  .  cyclobenzaprine (FLEXERIL) 10 MG tablet Take 1 tablet (10 mg total) by mouth 3 (three) times daily as needed for muscle spasms. 30 tablet 0  . FLUoxetine (PROZAC) 40 MG capsule TAKE 1 CAPSULE (40 MG TOTAL) BY MOUTH DAILY. 90 capsule 1  . HYDROcodone-acetaminophen (NORCO/VICODIN) 5-325 MG per tablet Take 1 tablet by mouth every 6 (six) hours as needed for moderate pain.    . Interferon Beta-1b (BETASERON) 0.3 MG KIT injection Inject 0.25 mg into the skin  every other day. 1 kit 6  . levocetirizine (XYZAL) 5 MG tablet Take 5 mg by mouth at bedtime as needed for allergies.    Marland Kitchen levothyroxine (SYNTHROID, LEVOTHROID) 125 MCG tablet TAKE 1 TABLET EVERY DAY 90 tablet 1  . losartan-hydrochlorothiazide (HYZAAR) 100-12.5 MG tablet Take 1 tablet by mouth daily. 90 tablet 1  . mometasone (NASONEX) 50 MCG/ACT nasal spray Place 2 sprays into the nose daily as needed (allergies).    . Multiple Vitamin (MULTIVITAMIN) tablet Take 1 tablet by mouth 2 (two) times daily.       No current facility-administered medications on file prior to visit.    She is allergic to codeine; hydromorphone hcl; ibuprofen; and morphine sulfate..  Review of Systems Review of Systems  Constitutional: Negative for activity change, appetite change and fatigue.  HENT: Negative for hearing loss, congestion, tinnitus and ear discharge.  dentist q38m Eyes: Negative for visual disturbance (see optho q1y -- vision corrected to 20/20 with glasses).  Respiratory: Negative for cough, chest tightness and shortness of breath.   Cardiovascular: Negative for chest pain, palpitations and leg swelling.  Gastrointestinal: Negative for abdominal pain, diarrhea, constipation and abdominal distention.  Genitourinary: Negative for urgency, frequency, decreased urine volume and difficulty urinating.  Musculoskeletal: Negative for back pain, arthralgias and gait problem.  Skin: Negative for color change, pallor and rash.  Neurological: Negative for dizziness, light-headedness, numbness and headaches.  Hematological: Negative for adenopathy. Does not bruise/bleed easily.  Psychiatric/Behavioral: Negative for suicidal ideas, confusion, sleep disturbance, self-injury, dysphoric mood, decreased concentration and agitation.       Objective:    BP 130/78 (BP Location: Right Arm, Cuff Size: Large)   Pulse (!) 58   Temp 97.5 F (36.4 C) (Oral)   Resp 16   Ht 5\' 6"  (1.676 m)   Wt 249 lb (112.9 kg)    SpO2 98%   BMI 40.19 kg/m  General appearance: alert, cooperative, appears stated age and no distress Head: Normocephalic, without obvious abnormality, atraumatic Eyes: conjunctivae/corneas clear. PERRL, EOM's intact. Fundi benign. Ears: normal TM's and external ear canals both ears Nose: Nares normal. Septum midline. Mucosa normal. No drainage or sinus tenderness. Throat: lips, mucosa, and tongue normal; teeth and gums normal Neck: no adenopathy, no carotid bruit, no JVD, supple, symmetrical, trachea midline and thyroid not enlarged, symmetric, no tenderness/mass/nodules Back: symmetric, no curvature. ROM normal. No CVA tenderness. Lungs: clear to auscultation bilaterally Breasts: gyn Heart: regular rate and rhythm, S1, S2 normal, no murmur, click, rub or gallop Abdomen: soft, non-tender; bowel sounds normal; no masses,  no organomegaly Pelvic: deferred--gyn Extremities: extremities normal, atraumatic, no cyanosis or edema Pulses: 2+ and symmetric Skin: Skin color, texture, turgor normal. No rashes or lesions Lymph nodes: Cervical, supraclavicular, and axillary nodes normal. Neurologic: Alert and oriented X 3, normal strength and tone. Normal symmetric reflexes. Normal coordination and gait    Assessment:    Healthy female exam.      Plan:    ghm utd Check labs See After  Visit Summary for Counseling Recommendations    1. Preventative health care See above - POCT Urinalysis Dipstick (Automated)  2. Essential hypertension Stable con't meds - Comprehensive metabolic panel - POCT Urinalysis Dipstick (Automated) - CBC with Differential/Platelet  3. Hypothyroidism, unspecified type On synthroid Check labs - TSH - POCT Urinalysis Dipstick (Automated) - CBC with Differential/Platelet  4. Hyperlipidemia, unspecified hyperlipidemia type Check labs con't meds  - Lipid panel - POCT Urinalysis Dipstick (Automated) - CBC with Differential/Platelet  5. Hyperglycemia con't  diet and exercise Check labs  - POCT Urinalysis Dipstick (Automated) - Hemoglobin A1c  6. Acute left-sided thoracic back pain stable - cyclobenzaprine (FLEXERIL) 10 MG tablet; Take 1 tablet (10 mg total) by mouth 3 (three) times daily as needed for muscle spasms.  Dispense: 30 tablet; Refill: 0  7. Strain of right hip, initial encounter Take flexeril and celebrex -- f/u ortho if no better - cyclobenzaprine (FLEXERIL) 10 MG tablet; Take 1 tablet (10 mg total) by mouth 3 (three) times daily as needed for muscle spasms.  Dispense: 30 tablet; Refill: 0  CMA served as Neurosurgeon during this visit. History, Physical and Plan performed by medical provider. Documentation and orders reviewed and attested to.   CMA served as Neurosurgeon during this visit. History, Physical and Plan performed by medical provider. Documentation and orders reviewed and attested to.

## 2016-09-25 NOTE — Assessment & Plan Note (Signed)
Well controlled, no changes to meds. Encouraged heart healthy diet such as the DASH diet and exercise as tolerated.  con't losartan hct 

## 2016-09-25 NOTE — Patient Instructions (Addendum)
Follow up with PCP as directed.  Continue to eat heart healthy diet (full of fruits, vegetables, whole grains, lean protein, water--limit salt, fat, and sugar intake) and increase physical activity as tolerated.  Continue doing brain stimulating activities (puzzles, reading, adult coloring books, staying active) to keep memory sharp.   Bring a copy of your advance directives to your next office visit.  Can try 1/2 tablet of Flexeril to see if that will help with right side pain.  Preventive Care 66 Years and Older, Female Preventive care refers to lifestyle choices and visits with your health care provider that can promote health and wellness. What does preventive care include?  A yearly physical exam. This is also called an annual well check.  Dental exams once or twice a year.  Routine eye exams. Ask your health care provider how often you should have your eyes checked.  Personal lifestyle choices, including:  Daily care of your teeth and gums.  Regular physical activity.  Eating a healthy diet.  Avoiding tobacco and drug use.  Limiting alcohol use.  Practicing safe sex.  Taking low-dose aspirin every day.  Taking vitamin and mineral supplements as recommended by your health care provider. What happens during an annual well check? The services and screenings done by your health care provider during your annual well check will depend on your age, overall health, lifestyle risk factors, and family history of disease. Counseling  Your health care provider may ask you questions about your:  Alcohol use.  Tobacco use.  Drug use.  Emotional well-being.  Home and relationship well-being.  Sexual activity.  Eating habits.  History of falls.  Memory and ability to understand (cognition).  Work and work Statistician.  Reproductive health. Screening  You may have the following tests or measurements:  Height, weight, and BMI.  Blood pressure.  Lipid and  cholesterol levels. These may be checked every 5 years, or more frequently if you are over 38 years old.  Skin check.  Lung cancer screening. You may have this screening every year starting at age 62 if you have a 30-pack-year history of smoking and currently smoke or have quit within the past 15 years.  Fecal occult blood test (FOBT) of the stool. You may have this test every year starting at age 50.  Flexible sigmoidoscopy or colonoscopy. You may have a sigmoidoscopy every 5 years or a colonoscopy every 10 years starting at age 61.  Hepatitis C blood test.  Hepatitis B blood test.  Sexually transmitted disease (STD) testing.  Diabetes screening. This is done by checking your blood sugar (glucose) after you have not eaten for a while (fasting). You may have this done every 1-3 years.  Bone density scan. This is done to screen for osteoporosis. You may have this done starting at age 22.  Mammogram. This may be done every 1-2 years. Talk to your health care provider about how often you should have regular mammograms. Talk with your health care provider about your test results, treatment options, and if necessary, the need for more tests. Vaccines  Your health care provider may recommend certain vaccines, such as:  Influenza vaccine. This is recommended every year.  Tetanus, diphtheria, and acellular pertussis (Tdap, Td) vaccine. You may need a Td booster every 10 years.  Varicella vaccine. You may need this if you have not been vaccinated.  Zoster vaccine. You may need this after age 50.  Measles, mumps, and rubella (MMR) vaccine. You may need at least one  dose of MMR if you were born in 1957 or later. You may also need a second dose.  Pneumococcal 13-valent conjugate (PCV13) vaccine. One dose is recommended after age 36.  Pneumococcal polysaccharide (PPSV23) vaccine. One dose is recommended after age 22.  Meningococcal vaccine. You may need this if you have certain  conditions.  Hepatitis A vaccine. You may need this if you have certain conditions or if you travel or work in places where you may be exposed to hepatitis A.  Hepatitis B vaccine. You may need this if you have certain conditions or if you travel or work in places where you may be exposed to hepatitis B.  Haemophilus influenzae type b (Hib) vaccine. You may need this if you have certain conditions. Talk to your health care provider about which screenings and vaccines you need and how often you need them. This information is not intended to replace advice given to you by your health care provider. Make sure you discuss any questions you have with your health care provider. Document Released: 06/18/2015 Document Revised: 02/09/2016 Document Reviewed: 03/23/2015 Elsevier Interactive Patient Education  2017 [employer].

## 2016-09-25 NOTE — Assessment & Plan Note (Signed)
con't lipitor  Tolerating statin, encouraged heart healthy diet, avoid trans fats, minimize simple carbs and saturated fats. Increase exercise as tolerated

## 2016-09-25 NOTE — Assessment & Plan Note (Signed)
Per neuro 

## 2016-09-25 NOTE — Progress Notes (Signed)
reviewed

## 2016-10-09 ENCOUNTER — Other Ambulatory Visit: Payer: Self-pay | Admitting: Family Medicine

## 2016-10-12 DIAGNOSIS — M47816 Spondylosis without myelopathy or radiculopathy, lumbar region: Secondary | ICD-10-CM | POA: Diagnosis not present

## 2016-10-12 DIAGNOSIS — Z79891 Long term (current) use of opiate analgesic: Secondary | ICD-10-CM | POA: Diagnosis not present

## 2016-10-12 DIAGNOSIS — M5136 Other intervertebral disc degeneration, lumbar region: Secondary | ICD-10-CM | POA: Diagnosis not present

## 2016-10-16 ENCOUNTER — Encounter: Payer: Self-pay | Admitting: Family Medicine

## 2016-10-16 ENCOUNTER — Other Ambulatory Visit (HOSPITAL_COMMUNITY)
Admission: RE | Admit: 2016-10-16 | Discharge: 2016-10-16 | Disposition: A | Discharge status: Home / Self Care | Payer: PPO | Source: Ambulatory Visit | Attending: Family Medicine | Admitting: Family Medicine

## 2016-10-16 ENCOUNTER — Ambulatory Visit (INDEPENDENT_AMBULATORY_CARE_PROVIDER_SITE_OTHER): Payer: PPO | Admitting: Family Medicine

## 2016-10-16 VITALS — BP 133/70 | HR 79 | Wt 252.2 lb

## 2016-10-16 DIAGNOSIS — N763 Subacute and chronic vulvitis: Secondary | ICD-10-CM | POA: Diagnosis not present

## 2016-10-16 DIAGNOSIS — L900 Lichen sclerosus et atrophicus: Secondary | ICD-10-CM | POA: Diagnosis not present

## 2016-10-16 DIAGNOSIS — N9089 Other specified noninflammatory disorders of vulva and perineum: Secondary | ICD-10-CM

## 2016-10-16 NOTE — Progress Notes (Signed)
Subjective:    Patient ID: Cassandra Rios, female    DOB: 01-18-1951, 66 y.o.   MRN: [id number]  HPI 66 year old patient seen for labial burning pain with a past 8 months. She was having constant itching, discomfort and pain when she urinated. She used premarin for a month, which didn't improve anything. She has been using clobetasol for the past 3 months. During this time, her itching as improved, but she continues to have constant discomfort with burning pain on her labia when she urinates.   Review of Systems     Objective:   Physical Exam  Constitutional: She appears well-developed and well-nourished.  HENT:  Head: Normocephalic and atraumatic.  Genitourinary:    There is tenderness on the right labia. There is tenderness on the left labia. No tenderness in the vagina. No vaginal discharge found.      Assessment & Plan:  1. Vulvar irritation Area cleaned with betadyne and injected with 3 mL of lidocaine. 2 6mm punch biopsies taken. Hemostasis achieved with silver nitrate and monsel's solution. F/u in 1 week for results.

## 2016-10-21 ENCOUNTER — Other Ambulatory Visit: Payer: Self-pay | Admitting: Family Medicine

## 2016-10-23 ENCOUNTER — Other Ambulatory Visit (HOSPITAL_COMMUNITY)
Admission: RE | Admit: 2016-10-23 | Discharge: 2016-10-23 | Disposition: A | Discharge status: Home / Self Care | Payer: PPO | Source: Ambulatory Visit | Attending: Family Medicine | Admitting: Family Medicine

## 2016-10-23 ENCOUNTER — Ambulatory Visit (INDEPENDENT_AMBULATORY_CARE_PROVIDER_SITE_OTHER): Payer: PPO | Admitting: Family Medicine

## 2016-10-23 VITALS — BP 136/75 | HR 83

## 2016-10-23 DIAGNOSIS — B9689 Other specified bacterial agents as the cause of diseases classified elsewhere: Secondary | ICD-10-CM | POA: Insufficient documentation

## 2016-10-23 DIAGNOSIS — N898 Other specified noninflammatory disorders of vagina: Secondary | ICD-10-CM

## 2016-10-23 DIAGNOSIS — N904 Leukoplakia of vulva: Secondary | ICD-10-CM | POA: Diagnosis not present

## 2016-10-23 DIAGNOSIS — Z113 Encounter for screening for infections with a predominantly sexual mode of transmission: Secondary | ICD-10-CM

## 2016-10-23 DIAGNOSIS — N760 Acute vaginitis: Secondary | ICD-10-CM | POA: Diagnosis not present

## 2016-10-23 MED ORDER — FLUCONAZOLE 150 MG PO TABS: 150.0000 mg | ORAL_TABLET | ORAL | 0 refills | Status: DC

## 2016-10-23 NOTE — Addendum Note (Signed)
Addended by: Shelly Coss on: 10/23/2016 04:10 PM   Modules accepted: Orders

## 2016-10-23 NOTE — Progress Notes (Addendum)
Subjective:    Patient ID: Cassandra Rios, female    DOB: Jul 11, 1950, 66 y.o.   MRN: [id number]  HPI Patient seen for follow up of biopsy. Having foul odor - using peribottle. Having some discharge. Pain improved. Internal itching. Has not been using clobetasol ointment over the past week.   Review of Systems     Objective:   Physical Exam  Constitutional: She appears well-developed and well-nourished.  Abdominal: Soft. There is no tenderness.  Genitourinary:             Assessment & Plan:  1. Lichen sclerosus of female genitalia Continue peribottle use - can use epson salts, but be sure to rinse area thoroughly. Pat dry. Keep as dry as possible. Will treat for underlying yeast. Vaginal culture obtained.

## 2016-10-23 NOTE — Patient Instructions (Signed)
Lichen Sclerosus Lichen sclerosus is a skin problem. It can happen on any part of the body, but it commonly involves the anal or genital areas. It can cause itching and discomfort in these areas. Treatment can help to control symptoms. When the genital area is affected, getting treatment is important because the condition can cause scarring that may lead to other problems. What are the causes? The cause of this condition is not known. It could be the result of an overactive immune system or a lack of certain hormones. Lichen sclerosus is not an infection or a fungus. It is not passed from one person to another (not contagious). What increases the risk? This condition is more likely to develop in women, usually after menopause. What are the signs or symptoms? Symptoms of this condition include:  Thin, wrinkled, white areas on the skin.  Thickened white areas on the skin.  Red and swollen patches (lesions) on the skin.  Tears or cracks in the skin.  Bruising.  Blood blisters.  Severe itching. You may also have pain, itching, or burning with urination. Constipation is also common in people with lichen sclerosus. How is this diagnosed? This condition may be diagnosed with a physical exam. In some cases, a tissue sample (biopsy sample) may be removed to be looked at under a microscope. How is this treated? This condition is usually treated with medicated creams or ointments (topical steroids) that are applied over the affected areas. Follow these instructions at home:  Take over-the-counter and prescription medicines only as told by your health care provider.  Use creams or ointments as told by your health care provider.  Do not scratch the affected areas of skin.  Women should keep the vaginal area as clean and dry as possible.  Keep all follow-up visits as told by your health care provider. This is important. Contact a health care provider if:  You have increasing redness,  swelling, or pain in the affected area.  You have fluid, blood, or pus coming from the affected area.  You have new lesions on your skin.  You have a fever.  You have pain during sex. This information is not intended to replace advice given to you by your health care provider. Make sure you discuss any questions you have with your health care provider. Document Released: 10/12/2010 Document Revised: 10/28/2015 Document Reviewed: 08/17/2014 Elsevier Interactive Patient Education  2017 [employer].

## 2016-10-23 NOTE — Progress Notes (Signed)
C/o a very pungent odor/ discharge from vulva.

## 2016-10-24 LAB — CERVICOVAGINAL ANCILLARY ONLY
Bacterial vaginitis: POSITIVE — AB
Candida vaginitis: NEGATIVE
Trichomonas: NEGATIVE

## 2016-10-25 ENCOUNTER — Other Ambulatory Visit: Payer: Self-pay | Admitting: Family Medicine

## 2016-10-25 MED ORDER — METRONIDAZOLE 500 MG PO TABS: 500.0000 mg | ORAL_TABLET | Freq: Two times a day (BID) | ORAL | 0 refills | Status: DC

## 2016-10-26 ENCOUNTER — Telehealth: Payer: Self-pay | Admitting: *Deleted

## 2016-10-26 NOTE — Telephone Encounter (Signed)
Called patient, wet prep came back + for BV. No answer- left message to call us back concerning non urgent results

## 2016-10-26 NOTE — Telephone Encounter (Signed)
Ms Baxley was seen by Dr Nehemiah Settle 5/21 and treated for yeast and bv. However a fax was received from patient's parmacy, [name], that they do not carry flagyl. Patient has 2 local pharmacies listed on her profile. Attempted to call patient to verify preferred pharmacy. Message left on answering machine asking her to return my call.

## 2016-11-01 NOTE — Telephone Encounter (Addendum)
Pt left message today @ 1351 stating that she received 2 messages from this office last week regarding test results. She called back on Friday and spoke with after hours nurse and no one has called her back. I called pt and left a message stating that we need to know which pharmacy she would like to use for one of the prescriptions which Dr. Nehemiah Settle gave her that cannot be filled at her [name] pharmacy. Please call back and leave that information on our nurse voicemail.   5/31  1404  Pt returned my call this morning and I was in pt care at the time.  I called her now and left a message stating that we just need to know the name of her pharmacy so that we can send her prescription. She may leave that information on the nurse voice mail as well as any questions she may have. Please state whether a detailed message can be left on her voice mail.

## 2016-11-02 ENCOUNTER — Ambulatory Visit
Admission: RE | Admit: 2016-11-02 | Discharge: 2016-11-02 | Disposition: A | Discharge status: Home / Self Care | Payer: PPO | Source: Ambulatory Visit | Attending: Adult Health | Admitting: Adult Health

## 2016-11-02 DIAGNOSIS — G35 Multiple sclerosis: Secondary | ICD-10-CM | POA: Diagnosis not present

## 2016-11-02 MED ORDER — METRONIDAZOLE 500 MG PO TABS: 500.0000 mg | ORAL_TABLET | Freq: Two times a day (BID) | ORAL | 0 refills | Status: DC

## 2016-11-02 MED ORDER — GADOBENATE DIMEGLUMINE 529 MG/ML IV SOLN
20.0000 mL | Freq: Once | INTRAVENOUS | Status: AC | PRN
  Administered 2016-11-02: 20 mL via INTRAVENOUS

## 2016-11-02 NOTE — Telephone Encounter (Signed)
[name] left a voicemail today that this is a message for Diane-that we keep missing each other. She states yes, we can leave a detailed message on her home voicemail. The correct [facility] [address]. States [facility] is only for her injectable meds for MS. Also states the doctor did not indicate if the rx is for the same steroid cream she is already on ; or something else. Would like to be called if it is something else so she can check her formulary..States is home today, but will be out tomorrow.

## 2016-11-02 NOTE — Telephone Encounter (Signed)
Spoke with pt and informed her of Rx for Metronidazole to treat BV has been [facility] as [facility]. She may continue to use the Clobetasol cream as previously directed. Pt voiced understanding.

## 2016-11-06 ENCOUNTER — Telehealth: Payer: Self-pay | Admitting: *Deleted

## 2016-11-06 DIAGNOSIS — N760 Acute vaginitis: Principal | ICD-10-CM

## 2016-11-06 DIAGNOSIS — B9689 Other specified bacterial agents as the cause of diseases classified elsewhere: Secondary | ICD-10-CM

## 2016-11-06 NOTE — Telephone Encounter (Signed)
Cassandra Rios called and left a message she saw Dr. Nehemiah Settle and he gave her metronidazole last week .States she quit taking it on Saturday because she had hives, breaking out and abdominal pain.  Wants to know if he wants her to try again a diferent dose or what.

## 2016-11-08 ENCOUNTER — Telehealth: Payer: Self-pay | Admitting: *Deleted

## 2016-11-08 MED ORDER — CLINDAMYCIN PHOSPHATE 2 % VA CREA: 1.0000 | TOPICAL_CREAM | Freq: Every day | VAGINAL | 0 refills | Status: DC

## 2016-11-08 NOTE — Telephone Encounter (Signed)
I spoke to pt and relayed not active lesions or new lesions.  She was not aware of the partially empty sella and would like a call back tomorrow or when you get chance.   This is also mentioned in 2015 MRI.

## 2016-11-08 NOTE — Telephone Encounter (Signed)
I called Cassandra Rios back and she states she only took 3 days worth and then developed a rash/ hives and she stopped. States the rash went away the next day.  Informed her I would send in a different prescription and she should never take metronidazole again as she is allergic to it now. I would call her back if needed.  Discussed with Dr. Elly Modena and at first was going to order tindamax but is contraindicated so after discussion with pharmacist and Dr. Elly Modena ordered clindamycin gel.  Order sent to pharmacy and called [name] and left a message I have sent in a prescription - call if questions.

## 2016-11-08 NOTE — Telephone Encounter (Signed)
-----   Message from Ward Givens, NP sent at 11/07/2016 11:07 AM EDT ----- No active lesions. Enlarged sella turcica consistent with a partially empty sella this is usually nonspecific no treatment needed. Please call patient with results  No signs /symptoms of elevated intracranial pressures

## 2016-11-09 NOTE — Telephone Encounter (Signed)
I called the patient and reviewed her MRI scan with her.

## 2016-11-27 ENCOUNTER — Encounter: Payer: Self-pay | Admitting: Family Medicine

## 2016-11-27 ENCOUNTER — Ambulatory Visit (INDEPENDENT_AMBULATORY_CARE_PROVIDER_SITE_OTHER): Payer: PPO | Admitting: Family Medicine

## 2016-11-27 VITALS — BP 169/77 | HR 63 | Wt 250.6 lb

## 2016-11-27 DIAGNOSIS — N904 Leukoplakia of vulva: Secondary | ICD-10-CM | POA: Diagnosis not present

## 2016-11-27 NOTE — Progress Notes (Signed)
Subjective:    Patient ID: Cassandra Rios, female    DOB: Dec 10, 1950, 66 y.o.   MRN: [id number]  HPI Patient seen for follow up of lichen sclerosis. Had biopsies done on 5/14, which showed lichen. Had BV - ended up taking clindamycin gel, which she finished about 10 days ago. After stopping the clinda-gel, she stopped the clobetasol ointment at the same time. Started to get a little sore area and started to use her steroid ointment again. No abnormal discharge, no odor, no dysuria.   Review of Systems     Objective:   Physical Exam  Constitutional: She appears well-developed and well-nourished.  Abdominal: Soft. There is no tenderness. There is no rebound and no guarding.  Genitourinary:    There is no tenderness, lesion or injury on the right labia. There is no tenderness, lesion or injury on the left labia.        Assessment & Plan:  1. Lichen sclerosus of female genitalia Restart the clobetasol - BID for the next week or until the soreness resolves, then decrease to daily. Return in 3 months.

## 2016-12-15 ENCOUNTER — Other Ambulatory Visit: Payer: Self-pay | Admitting: Family Medicine

## 2016-12-15 DIAGNOSIS — E039 Hypothyroidism, unspecified: Secondary | ICD-10-CM

## 2016-12-20 ENCOUNTER — Other Ambulatory Visit: Payer: Self-pay | Admitting: Neurology

## 2016-12-20 DIAGNOSIS — G35 Multiple sclerosis: Secondary | ICD-10-CM

## 2016-12-26 ENCOUNTER — Other Ambulatory Visit: Payer: Self-pay | Admitting: Family Medicine

## 2016-12-26 DIAGNOSIS — M546 Pain in thoracic spine: Secondary | ICD-10-CM

## 2017-01-31 ENCOUNTER — Other Ambulatory Visit: Payer: Self-pay | Admitting: Family Medicine

## 2017-02-08 ENCOUNTER — Encounter: Payer: Self-pay | Admitting: Family Medicine

## 2017-02-08 ENCOUNTER — Ambulatory Visit (INDEPENDENT_AMBULATORY_CARE_PROVIDER_SITE_OTHER): Payer: PPO | Admitting: Family Medicine

## 2017-02-08 VITALS — BP 142/58 | HR 73 | Temp 97.9°F | Ht 66.0 in | Wt 251.4 lb

## 2017-02-08 DIAGNOSIS — J301 Allergic rhinitis due to pollen: Secondary | ICD-10-CM

## 2017-02-08 MED ORDER — MOMETASONE FUROATE 50 MCG/ACT NA SUSP: 2.0000 | Freq: Every day | NASAL | 5 refills | Status: DC | PRN

## 2017-02-08 NOTE — Patient Instructions (Signed)

## 2017-02-08 NOTE — Progress Notes (Signed)
Patient ID: Cassandra Rios, female    DOB: [birth year]  Age: 65 y.o. MRN: [id number]    Subjective:  Subjective  HPI Cassandra Rios presents for nasal congestion and sinus pressure x 1 week. She has been off her xyzal and nasonex  Review of Systems  Constitutional: Positive for chills. Negative for fever.  HENT: Positive for congestion, postnasal drip, rhinorrhea and sinus pressure.   Respiratory: Positive for cough. Negative for chest tightness, shortness of breath and wheezing.   Cardiovascular: Negative for chest pain, palpitations and leg swelling.  Allergic/Immunologic: Negative for environmental allergies.    History Past Medical History:  Diagnosis Date  . AAA (abdominal aortic aneurysm) (HCC)   . Allergic rhinitis   . Chronic hoarseness   . Colon polyps   . COPD (chronic obstructive pulmonary disease) (HCC)    Dr. Marchelle Gearing  . Dyslipidemia   . Hypertension   . Hypothyroidism   . MS (multiple sclerosis) (HCC)   . Obesity   . Stomach ulcer from aspirin/ibuprofen-like drugs (NSAID's)     She has a past surgical history that includes Cholecystectomy; Carpal tunnel release; Bariatric Surgery (09/07/08); Hiatal hernia repair (09/07/08); Abdominal hysterectomy; and Cholecystectomy.   Her family history includes AAA (abdominal aortic aneurysm) in her father and sister; Arthritis in her sister; COPD in her father and sister; Dementia in her sister; Heart disease in her father and mother; Other in her sister; Stroke (age of onset: 29) in her mother.She reports that she quit smoking about 9 years ago. Her smoking use included Cigarettes. She has a 70.00 pack-year smoking history. She has never used smokeless tobacco. She reports that she does not drink alcohol or use drugs.  Current Outpatient Prescriptions on File Prior to Visit  Medication Sig Dispense Refill  . aspirin 81 MG tablet Take 81 mg by mouth daily.    Marland Kitchen atorvastatin (LIPITOR) 20 MG tablet TAKE 1 TABLET (20 MG  TOTAL) BY MOUTH DAILY. 90 tablet 1  . BETASERON 0.3 MG KIT injection RECONSTITUTE AS DIRECTED AND INJECT 0.25MG  (= ) SUBCUTANEOUSLY ONCE EVERY OTHER DAY AS DIRECTED. 36 each 3  . Calcium Carbonate-Vitamin D (CALCIUM 600 + D PO) Take 1 tablet by mouth daily.     . celecoxib (CELEBREX) 200 MG capsule Take 200 mg by mouth daily as needed (inflammation).    . clobetasol ointment (TEMOVATE) 0.05 % APPLY TO AFFECTED AREA TWICE A DAY 30 g 3  . cyclobenzaprine (FLEXERIL) 10 MG tablet Take 1 tablet (10 mg total) by mouth 3 (three) times daily as needed for muscle spasms. 30 tablet 0  . FLUoxetine (PROZAC) 40 MG capsule TAKE 1 CAPSULE (40 MG TOTAL) BY MOUTH DAILY. 90 capsule 1  . HYDROcodone-acetaminophen (NORCO/VICODIN) 5-325 MG per tablet Take 1 tablet by mouth every 6 (six) hours as needed for moderate pain.    Marland Kitchen levocetirizine (XYZAL) 5 MG tablet Take 5 mg by mouth at bedtime as needed for allergies.    Marland Kitchen levothyroxine (SYNTHROID, LEVOTHROID) 125 MCG tablet TAKE 1 TABLET EVERY DAY 90 tablet 1  . losartan-hydrochlorothiazide (HYZAAR) 100-12.5 MG tablet Take 1 tablet by mouth daily. 90 tablet 1  . Multiple Vitamin (MULTIVITAMIN) tablet Take 1 tablet by mouth 2 (two) times daily.       No current facility-administered medications on file prior to visit.      Objective:  Objective  Physical Exam  Constitutional: She is oriented to person, place, and time. She appears well-developed and well-nourished.  HENT:  Head: Normocephalic and atraumatic.  Right Ear: External ear normal.  Left Ear: External ear normal.  Nose: Rhinorrhea present. Right sinus exhibits maxillary sinus tenderness and frontal sinus tenderness. Left sinus exhibits maxillary sinus tenderness and frontal sinus tenderness.  Mouth/Throat: Oropharyngeal exudate present.  + PND + errythema  Eyes: Conjunctivae and EOM are normal. Right eye exhibits no discharge. Left eye exhibits no discharge.  Neck: Normal range of motion. Neck  supple. No JVD present. Carotid bruit is not present. No thyromegaly present.  Cardiovascular: Normal rate, regular rhythm and normal heart sounds.   No murmur heard. Pulmonary/Chest: Effort normal and breath sounds normal. No respiratory distress. She has no wheezes. She has no rales. She exhibits no tenderness.  Musculoskeletal: She exhibits no edema.  Lymphadenopathy:    She has cervical adenopathy.  Neurological: She is alert and oriented to person, place, and time.  Psychiatric: She has a normal mood and affect.  Nursing note and vitals reviewed.  BP (!) 142/58 (BP Location: Left Arm, Patient Position: Sitting, Cuff Size: Large)   Pulse 73   Temp 97.9 F (36.6 C) (Oral)   Ht 5\' 6"  (1.676 m)   Wt 251 lb 6.4 oz (114 kg)   SpO2 97%   BMI 40.58 kg/m  Wt Readings from Last 3 Encounters:  02/08/17 251 lb 6.4 oz (114 kg)  11/27/16 250 lb 9.6 oz (113.7 kg)  10/16/16 252 lb 3.2 oz (114.4 kg)     Lab Results  Component Value Date   WBC 6.8 09/25/2016   HGB 14.1 09/25/2016   HCT 42.5 09/25/2016   PLT 157.0 09/25/2016   GLUCOSE 96 09/25/2016   CHOL 158 09/25/2016   TRIG 90.0 09/25/2016   HDL 57.10 09/25/2016   LDLCALC 83 09/25/2016   ALT 17 09/25/2016   AST 20 09/25/2016   NA 133 (L) 09/25/2016   K 3.9 09/25/2016   CL 98 09/25/2016   CREATININE 0.92 09/25/2016   BUN 11 09/25/2016   CO2 29 09/25/2016   TSH 4.05 09/25/2016   HGBA1C 5.9 09/25/2016   MICROALBUR 2.5 (H) 09/15/2014    Mr Brain W Wo Contrast  Result Date: 11/03/2016  [facility] [address], [address] [place], [place] [address] [phone] NEUROIMAGING REPORT STUDY DATE: 06/04/2017 PATIENT NAME: Cassandra Rios DOB: 04-19-1951 MRN: [id number] EXAM: MRI Brain with and without contrast ORDERING CLINICIAN: Butch Penny NP CLINICAL HISTORY: 66 year old woman with multiple sclerosis COMPARISON FILMS: None available TECHNIQUE:MRI of the brain with and without contrast was obtained utilizing 5 mm  axial slices with T1, T2, T2 flair, SWI and diffusion weighted views.  T1 sagittal, T2 coronal and postcontrast views in the axial and coronal plane were obtained. CONTRAST: 20 ml [facility] IMAGING SITE: [facility], [address] [place]. FINDINGS: On sagittal images, the spinal cord is imaged caudally to C3 and is normal in caliber.   The contents of the posterior fossa are of normal size and position.   Pituitary tissue is flattened within a mildly enlarged sella turcica consistent with a "partially empty sella". The optic chiasm appear normal.    Brain volume appears normal.   The ventricles are normal in size and without distortion.  There are no abnormal extra-axial collections of fluid.  The cerebellum and brainstem appears normal.   The deep gray matter appears normal.  In the hemispheres, there are multiple T2/FLAIR hyperintense foci in the periventricular, juxtacortical and deep white matter. Some of the periventricular foci are radially oriented  to the ventricles. Some foci are hypointense on T1-weighted images. None of the foci appeared to be acute..   Diffusion weighted images are normal.  Susceptibility weighted images are normal.   The orbits appear normal.   The VIIth/VIIIth nerve complex appears normal.  The mastoid air cells appear normal.  The paranasal sinuses appear normal.  Flow voids are identified within the major intracerebral arteries.  After the infusion of contrast material, a normal enhancement pattern is noted.    This MRI of the brain with and without contrast shows the following: 1.   Multiple T2/FLAIR hyperintense foci in the hemispheres in a pattern and configuration consistent with chronic demyelinating plaque associated with multiple sclerosis. None of the foci appears to be acute. 2.   Pituitary tissue is flattened within an enlarged sella turcica consistent with a "partially empty sella". Although nonspecific, this can be seen in the setting of elevated intracranial  pressures. 3.   There are no acute findings and there is a normal enhancement pattern. INTERPRETING PHYSICIAN: Richard A. Epimenio Foot, MD, PhD Certified in  Neuroimaging by American Society of Neuroimaging     Assessment & Plan:  Plan  I am having Ms. Bene maintain her multivitamin, Calcium Carbonate-Vitamin D (CALCIUM 600 + D PO), aspirin, HYDROcodone-acetaminophen, losartan-hydrochlorothiazide, FLUoxetine, atorvastatin, celecoxib, levocetirizine, levothyroxine, BETASERON, cyclobenzaprine, clobetasol ointment, traMADol, and mometasone.  Meds ordered this encounter  Medications  . traMADol (ULTRAM) 50 MG tablet    Sig: Take 50 mg by mouth every 6 (six) hours as needed.  . mometasone (NASONEX) 50 MCG/ACT nasal spray    Sig: Place 2 sprays into the nose daily as needed (allergies).    Dispense:  17 g    Refill:  5    Problem List Items Addressed This Visit      Unprioritized   Allergic rhinitis - Primary   Relevant Medications   mometasone (NASONEX) 50 MCG/ACT nasal spray          Restart xyzal and nasonex  Call or rto prn  Follow-up: Return if symptoms worsen or fail to improve.  [name], DO

## 2017-03-19 ENCOUNTER — Encounter: Payer: Self-pay | Admitting: Family Medicine

## 2017-03-19 ENCOUNTER — Telehealth: Payer: Self-pay | Admitting: *Deleted

## 2017-03-19 ENCOUNTER — Ambulatory Visit (INDEPENDENT_AMBULATORY_CARE_PROVIDER_SITE_OTHER): Payer: PPO | Admitting: Family Medicine

## 2017-03-19 VITALS — BP 131/50 | HR 70 | Wt 248.0 lb

## 2017-03-19 DIAGNOSIS — B9689 Other specified bacterial agents as the cause of diseases classified elsewhere: Secondary | ICD-10-CM | POA: Diagnosis not present

## 2017-03-19 DIAGNOSIS — N904 Leukoplakia of vulva: Secondary | ICD-10-CM

## 2017-03-19 DIAGNOSIS — N760 Acute vaginitis: Secondary | ICD-10-CM

## 2017-03-19 DIAGNOSIS — Z113 Encounter for screening for infections with a predominantly sexual mode of transmission: Secondary | ICD-10-CM

## 2017-03-19 MED ORDER — ESTROGENS, CONJUGATED 0.625 MG/GM VA CREA: 1.0000 | TOPICAL_CREAM | Freq: Every day | VAGINAL | 12 refills | Status: DC

## 2017-03-19 MED ORDER — CLINDAMYCIN PHOSPHATE 2 % VA CREA: 1.0000 | TOPICAL_CREAM | Freq: Every day | VAGINAL | 0 refills | Status: AC

## 2017-03-19 NOTE — Telephone Encounter (Signed)
Called Dr.Libby Edwards ( dermotologist) in [place] at [phone] per Dr.Stinson to make referral for Lichen scleroisis.Was notified by office we need to send referral first , then Dr.Edwards will review and decide if she will accept the patient.  May take up to a month.  Called patient and left a message notifiying her of plan.  Will initiate referral today. Per office patient or office can send an email to check on referral at [email or web address]

## 2017-03-19 NOTE — Progress Notes (Signed)
Subjective:    Patient ID: Cassandra Rios, female    DOB: June 14, 1950, 66 y.o.   MRN: [id number]  HPI Patient seen for follow up of lichen sclerosis. Last follow up was 6/25. Has been on Clobetasol ointment since that time, although has been trying to wean it. Hasn't been able to wean to less than once a day, but has been getting sore areas. Even using clobetasol twice a day is not enough sometimes.    Review of Systems     Objective:   Physical Exam  Constitutional: She is oriented to person, place, and time. She appears well-developed and well-nourished.  Pulmonary/Chest: Effort normal.  Abdominal: Soft. There is tenderness.  Genitourinary:     Neurological: She is alert and oriented to person, place, and time.  Skin: Skin is warm and dry.  Psychiatric: She has a normal mood and affect. Her behavior is normal. Judgment and thought content normal.       Assessment & Plan:  1. Lichen sclerosus of female genitalia Continue clobetasol bid. Add premarin. Will refer to Hinton Lovely, MD dermatology in [place] as it may take several months to get into her. F/u in 4 months - Ambulatory referral to Dermatology  2. BV (bacterial vaginosis) - clindamycin (CLEOCIN) 2 % vaginal cream; Place 1 Applicatorful vaginally at bedtime.  Dispense: 40 g; Refill: 0

## 2017-03-22 ENCOUNTER — Encounter: Payer: Self-pay | Admitting: *Deleted

## 2017-03-22 ENCOUNTER — Telehealth: Payer: Self-pay | Admitting: Adult Health

## 2017-03-22 DIAGNOSIS — G35 Multiple sclerosis: Secondary | ICD-10-CM

## 2017-03-22 NOTE — Telephone Encounter (Signed)
Patients states she needs assistants on her medication Betaseron. She has a Armed forces operational officer through health well that runs out at the end of the month and then after that she will not be able to afford this medication. She sates that Healthcare professional with [employer] [name] [employer] was suppose to either fax [name] or mail [name] a form that needs to be filled out. She states she will be here after 2:00 pm at the office to bring her copy of it.

## 2017-03-22 NOTE — Telephone Encounter (Signed)
Received

## 2017-03-22 NOTE — Telephone Encounter (Signed)
Sandy I don't have this see message below I told her you would call her back she was fine.

## 2017-03-23 MED ORDER — INTERFERON BETA-1B 0.3 MG ~~LOC~~ KIT: PACK | SUBCUTANEOUS | 3 refills | Status: DC

## 2017-03-23 NOTE — Addendum Note (Signed)
Addended by: Oliver Hum S on: 03/23/2017 12:10 PM   Modules accepted: Orders

## 2017-03-23 NOTE — Telephone Encounter (Signed)
Form completed and to MM/NP desk for review and signature.

## 2017-03-26 NOTE — Telephone Encounter (Signed)
Signed and given to [facility]

## 2017-03-26 NOTE — Telephone Encounter (Signed)
Per MM, this was taken care of by Dewaine Oats, RN.

## 2017-03-26 NOTE — Telephone Encounter (Signed)
Patient Assistance Form signed and faxed to [place] F # [phone]. Pt notified via TC.

## 2017-03-27 ENCOUNTER — Telehealth: Payer: Self-pay | Admitting: General Practice

## 2017-03-27 DIAGNOSIS — N904 Leukoplakia of vulva: Secondary | ICD-10-CM

## 2017-03-27 NOTE — Telephone Encounter (Signed)
Patient called and left message stating she needs to talk to nurse Vaughan Basta about a referral to Dr Oletta Lamas. Patient states she has found an associate of his that is with [facility] and much closer than [place]. She needs her records sent there and requests call back. Called patient, no answer- left message stating we are trying to reach you to return your phone call, please call us back.

## 2017-03-28 NOTE — Telephone Encounter (Signed)
Late entry.  10/23 @ 1145 Called patient stating I am trying to reach her to return her phone call. Patient was upset with the delay in return phone call. Apologized to patient and explained that we have a new process in place with someone helping [name] with phone calls and there seems to be a miscommunication/delay in returning calls. Told patient I received her phone message and asked how we could help. Patient states the office in [place] will not accept her type of medicare but recommended a office at [facility] with Dr Lamount Cohen who trained under Dr Oletta Lamas. Patient provided fax & phone number and requests records to be sent there. Told patient I will get our front office staff to send her records out today to Dr Albin Felling office. Apologized again to patient for the delay. Patient verbalized understanding & was grateful. Patient had no other questions

## 2017-03-29 ENCOUNTER — Ambulatory Visit (INDEPENDENT_AMBULATORY_CARE_PROVIDER_SITE_OTHER): Payer: PPO | Admitting: Family Medicine

## 2017-03-29 ENCOUNTER — Encounter: Payer: Self-pay | Admitting: Family Medicine

## 2017-03-29 VITALS — BP 118/80 | Temp 97.7°F | Ht 66.0 in | Wt 252.0 lb

## 2017-03-29 DIAGNOSIS — J302 Other seasonal allergic rhinitis: Secondary | ICD-10-CM | POA: Diagnosis not present

## 2017-03-29 DIAGNOSIS — D692 Other nonthrombocytopenic purpura: Secondary | ICD-10-CM | POA: Diagnosis not present

## 2017-03-29 DIAGNOSIS — I10 Essential (primary) hypertension: Secondary | ICD-10-CM

## 2017-03-29 DIAGNOSIS — E039 Hypothyroidism, unspecified: Secondary | ICD-10-CM

## 2017-03-29 DIAGNOSIS — R51 Headache: Secondary | ICD-10-CM

## 2017-03-29 DIAGNOSIS — R519 Headache, unspecified: Secondary | ICD-10-CM

## 2017-03-29 DIAGNOSIS — J301 Allergic rhinitis due to pollen: Secondary | ICD-10-CM | POA: Diagnosis not present

## 2017-03-29 DIAGNOSIS — M546 Pain in thoracic spine: Secondary | ICD-10-CM

## 2017-03-29 DIAGNOSIS — F411 Generalized anxiety disorder: Secondary | ICD-10-CM | POA: Diagnosis not present

## 2017-03-29 DIAGNOSIS — E2839 Other primary ovarian failure: Secondary | ICD-10-CM | POA: Diagnosis not present

## 2017-03-29 DIAGNOSIS — E785 Hyperlipidemia, unspecified: Secondary | ICD-10-CM

## 2017-03-29 LAB — LIPID PANEL
CHOLESTEROL: 158 mg/dL (ref 0–200)
HDL: 54.8 mg/dL (ref >39.00)
LDL Cholesterol: 77 mg/dL (ref 0–99)
NonHDL: 102.96
TRIGLYCERIDES: 129 mg/dL (ref 0.0–149.0)
Total CHOL/HDL Ratio: 3
VLDL: 25.8 mg/dL (ref 0.0–40.0)

## 2017-03-29 LAB — COMPREHENSIVE METABOLIC PANEL
ALK PHOS: 63 U/L (ref 39–117)
ALT: 18 U/L (ref 0–35)
AST: 21 U/L (ref 0–37)
Albumin: 3.7 g/dL (ref 3.5–5.2)
BILIRUBIN TOTAL: 0.5 mg/dL (ref 0.2–1.2)
BUN: 10 mg/dL (ref 6–23)
CALCIUM: 9.4 mg/dL (ref 8.4–10.5)
CO2: 29 meq/L (ref 19–32)
CREATININE: 0.85 mg/dL (ref 0.40–1.20)
Chloride: 100 mEq/L (ref 96–112)
GFR: 70.87 mL/min (ref >60.00)
Glucose, Bld: 105 mg/dL — ABNORMAL HIGH (ref 70–99)
Potassium: 4.5 mEq/L (ref 3.5–5.1)
Sodium: 136 mEq/L (ref 135–145)
TOTAL PROTEIN: 6.7 g/dL (ref 6.0–8.3)

## 2017-03-29 LAB — CBC WITH DIFFERENTIAL/PLATELET
BASOS ABS: 0 10*3/uL (ref 0.0–0.1)
BASOS PCT: 0.5 % (ref 0.0–3.0)
EOS ABS: 0.1 10*3/uL (ref 0.0–0.7)
Eosinophils Relative: 1.6 % (ref 0.0–5.0)
HEMATOCRIT: 41.9 % (ref 36.0–46.0)
HEMOGLOBIN: 14 g/dL (ref 12.0–15.0)
LYMPHS PCT: 28.6 % (ref 12.0–46.0)
Lymphs Abs: 1.7 10*3/uL (ref 0.7–4.0)
MCHC: 33.3 g/dL (ref 30.0–36.0)
MCV: 91.4 fl (ref 78.0–100.0)
MONO ABS: 0.5 10*3/uL (ref 0.1–1.0)
Monocytes Relative: 8.1 % (ref 3.0–12.0)
NEUTROS ABS: 3.6 10*3/uL (ref 1.4–7.7)
Neutrophils Relative %: 61.2 % (ref 43.0–77.0)
PLATELETS: 130 10*3/uL — AB (ref 150.0–400.0)
RBC: 4.59 Mil/uL (ref 3.87–5.11)
RDW: 13.3 % (ref 11.5–15.5)
WBC: 5.9 10*3/uL (ref 4.0–10.5)

## 2017-03-29 LAB — TSH: TSH: 2.78 u[IU]/mL (ref 0.35–4.50)

## 2017-03-29 MED ORDER — TRAMADOL HCL 50 MG PO TABS: 50.0000 mg | ORAL_TABLET | Freq: Four times a day (QID) | ORAL | 1 refills | Status: DC | PRN

## 2017-03-29 MED ORDER — LOSARTAN POTASSIUM-HCTZ 100-12.5 MG PO TABS: 1.0000 | ORAL_TABLET | Freq: Every day | ORAL | 1 refills | Status: DC

## 2017-03-29 MED ORDER — LEVOTHYROXINE SODIUM 125 MCG PO TABS: 125.0000 ug | ORAL_TABLET | Freq: Every day | ORAL | 1 refills | Status: DC

## 2017-03-29 MED ORDER — FLUOXETINE HCL 40 MG PO CAPS: ORAL_CAPSULE | ORAL | 1 refills | Status: DC

## 2017-03-29 MED ORDER — LEVOCETIRIZINE DIHYDROCHLORIDE 5 MG PO TABS: 5.0000 mg | ORAL_TABLET | Freq: Every evening | ORAL | 1 refills | Status: DC | PRN

## 2017-03-29 MED ORDER — CYCLOBENZAPRINE HCL 10 MG PO TABS: 10.0000 mg | ORAL_TABLET | Freq: Three times a day (TID) | ORAL | 0 refills | Status: DC | PRN

## 2017-03-29 NOTE — Assessment & Plan Note (Signed)
Well controlled, no changes to meds. Encouraged heart healthy diet such as the DASH diet and exercise as tolerated.  °

## 2017-03-29 NOTE — Progress Notes (Signed)
Effort normal and breath sounds normal. No respiratory distress. She has no wheezes. She has no rales. She exhibits no tenderness.  Abdominal: Soft. She exhibits no distension. There is no tenderness. There is no rebound, no guarding and no CVA tenderness.  Genitourinary: Pelvic exam was performed with patient supine. There is no rash, tenderness,  lesion or injury on the right labia. There is no rash, tenderness, lesion or injury on the left labia. No erythema or tenderness in the vagina. No vaginal discharge found.  Musculoskeletal: She exhibits no edema.  Neurological: She is alert and oriented to person, place, and time.  Psychiatric: She has a normal mood and affect.  Nursing note and vitals reviewed.  BP 118/80   Temp 97.7 F (36.5 C) (Oral)   Ht '5\' 6"'$  (1.676 m)   Wt 252 lb (114.3 kg)   BMI 40.67 kg/m  Wt Readings from Last 3 Encounters:  03/29/17 252 lb (114.3 kg)  03/19/17 248 lb (112.5 kg)  02/08/17 251 lb 6.4 oz (114 kg)     Lab Results  Component Value Date   WBC 5.9 03/29/2017   HGB 14.0 03/29/2017   HCT 41.9 03/29/2017   PLT 130.0 (L) 03/29/2017   GLUCOSE 105 (H) 03/29/2017   CHOL 158 03/29/2017   TRIG 129.0 03/29/2017   HDL 54.80 03/29/2017   LDLCALC 77 03/29/2017   ALT 18 03/29/2017   AST 21 03/29/2017   NA 136 03/29/2017   K 4.5 03/29/2017   CL 100 03/29/2017   CREATININE 0.85 03/29/2017   BUN 10 03/29/2017   CO2 29 03/29/2017   TSH 2.78 03/29/2017   HGBA1C 5.9 09/25/2016   MICROALBUR 2.5 (H) 09/15/2014    Mr Brain W Wo Contrast  Result Date: 11/03/2016  [facility] [address] [place], [place] [address] [phone] NEUROIMAGING REPORT STUDY DATE: 06/04/2017 PATIENT NAME: Cassandra Rios DOB: 07/13/1950 MRN: [id number] EXAM: MRI Brain with and without contrast ORDERING CLINICIAN: Ward Givens NP CLINICAL HISTORY: 66 year old woman with multiple sclerosis COMPARISON FILMS: None available TECHNIQUE:MRI of the brain with and without contrast was obtained utilizing 5 mm axial slices with T1, T2, T2 flair, SWI and diffusion weighted views.  T1 sagittal, T2 coronal and postcontrast views in the axial and coronal plane were obtained. CONTRAST: 20 ml Multihance IMAGING SITE: [employer] [facility], [place]. FINDINGS: On sagittal images, the spinal cord is imaged  caudally to C3 and is normal in caliber.   The contents of the posterior fossa are of normal size and position.   Pituitary tissue is flattened within a mildly enlarged sella turcica consistent with a "partially empty sella". The optic chiasm appear normal.    Brain volume appears normal.   The ventricles are normal in size and without distortion.  There are no abnormal extra-axial collections of fluid.  The cerebellum and brainstem appears normal.   The deep gray matter appears normal.  In the hemispheres, there are multiple T2/FLAIR hyperintense foci in the periventricular, juxtacortical and deep white matter. Some of the periventricular foci are radially oriented to the ventricles. Some foci are hypointense on T1-weighted images. None of the foci appeared to be acute..   Diffusion weighted images are normal.  Susceptibility weighted images are normal.   The orbits appear normal.   The VIIth/VIIIth nerve complex appears normal.  The mastoid air cells appear normal.  The paranasal sinuses appear normal.  Flow voids are identified within the major intracerebral arteries.  After  Patient ID: Cassandra Rios, female    DOB: Oct 29, 1950  Age: 66 y.o. MRN: [id number]    Subjective:  Subjective  HPI Cassandra Rios presents for f/u bp, cholesterol and thyroid  Review of Systems  Constitutional: Negative for appetite change, diaphoresis, fatigue and unexpected weight change.  Eyes: Negative for pain, redness and visual disturbance.  Respiratory: Negative for cough, chest tightness, shortness of breath and wheezing.   Cardiovascular: Negative for chest pain, palpitations and leg swelling.  Endocrine: Negative for cold intolerance, heat intolerance, polydipsia, polyphagia and polyuria.  Genitourinary: Negative for difficulty urinating, dysuria and frequency.  Neurological: Negative for dizziness, light-headedness, numbness and headaches.    History Past Medical History:  Diagnosis Date  . AAA (abdominal aortic aneurysm) ([place])   . Allergic rhinitis   . Chronic hoarseness   . Colon polyps   . COPD (chronic obstructive pulmonary disease) ([facility])    Dr. Chase Caller  . Dyslipidemia   . Hypertension   . Hypothyroidism   . MS (multiple sclerosis) ([place])   . Obesity   . Stomach ulcer from aspirin/ibuprofen-like drugs (NSAID's)     She has a past surgical history that includes Cholecystectomy; Carpal tunnel release; Bariatric Surgery (09/07/08); Hiatal hernia repair (09/07/08); Abdominal hysterectomy; and Cholecystectomy.   Her family history includes AAA (abdominal aortic aneurysm) in her father and sister; Arthritis in her sister; COPD in her father and sister; Dementia in her sister; Heart disease in her father and mother; Other in her sister; Stroke (age of onset: 75) in her mother.She reports that she quit smoking about 9 years ago. Her smoking use included Cigarettes. She has a 70.00 pack-year smoking history. She has never used smokeless tobacco. She reports that she does not drink alcohol or use drugs.  Current Outpatient Prescriptions on File Prior to Visit    Medication Sig Dispense Refill  . aspirin 81 MG tablet Take 81 mg by mouth daily.    Marland Kitchen atorvastatin (LIPITOR) 20 MG tablet TAKE 1 TABLET (20 MG TOTAL) BY MOUTH DAILY. 90 tablet 1  . Calcium Carbonate-Vitamin D (CALCIUM 600 + D PO) Take 1 tablet by mouth daily.     . celecoxib (CELEBREX) 200 MG capsule Take 200 mg by mouth daily as needed (inflammation).    . clobetasol ointment (TEMOVATE) 0.05 % APPLY TO AFFECTED AREA TWICE A DAY 30 g 3  . conjugated estrogens (PREMARIN) vaginal cream Place 1 Applicatorful vaginally daily. 42.5 g 12  . HYDROcodone-acetaminophen (NORCO/VICODIN) 5-325 MG per tablet Take 1 tablet by mouth every 6 (six) hours as needed for moderate pain.    . Interferon Beta-1b (BETASERON) 0.3 MG KIT injection RECONSTITUTE AS DIRECTED AND INJECT 0.25MG (= 1ML) SUBCUTANEOUSLY ONCE EVERY OTHER DAY AS DIRECTED. 36 each 3  . mometasone (NASONEX) 50 MCG/ACT nasal spray Place 2 sprays into the nose daily as needed (allergies). 17 g 5  . Multiple Vitamin (MULTIVITAMIN) tablet Take 1 tablet by mouth 2 (two) times daily.       No current facility-administered medications on file prior to visit.      Objective:  Objective  Physical Exam  Constitutional: She is oriented to person, place, and time. She appears well-developed and well-nourished.  HENT:  Head: Normocephalic and atraumatic.  Eyes: Conjunctivae and EOM are normal.  Neck: Normal range of motion. Neck supple. No JVD present. Carotid bruit is not present. No thyromegaly present.  Cardiovascular: Normal rate, regular rhythm and normal heart sounds.   No murmur heard. Pulmonary/Chest:  Effort normal and breath sounds normal. No respiratory distress. She has no wheezes. She has no rales. She exhibits no tenderness.  Abdominal: Soft. She exhibits no distension. There is no tenderness. There is no rebound, no guarding and no CVA tenderness.  Genitourinary: Pelvic exam was performed with patient supine. There is no rash, tenderness,  lesion or injury on the right labia. There is no rash, tenderness, lesion or injury on the left labia. No erythema or tenderness in the vagina. No vaginal discharge found.  Musculoskeletal: She exhibits no edema.  Neurological: She is alert and oriented to person, place, and time.  Psychiatric: She has a normal mood and affect.  Nursing note and vitals reviewed.  BP 118/80   Temp 97.7 F (36.5 C) (Oral)   Ht '5\' 6"'$  (1.676 m)   Wt 252 lb (114.3 kg)   BMI 40.67 kg/m  Wt Readings from Last 3 Encounters:  03/29/17 252 lb (114.3 kg)  03/19/17 248 lb (112.5 kg)  02/08/17 251 lb 6.4 oz (114 kg)     Lab Results  Component Value Date   WBC 5.9 03/29/2017   HGB 14.0 03/29/2017   HCT 41.9 03/29/2017   PLT 130.0 (L) 03/29/2017   GLUCOSE 105 (H) 03/29/2017   CHOL 158 03/29/2017   TRIG 129.0 03/29/2017   HDL 54.80 03/29/2017   LDLCALC 77 03/29/2017   ALT 18 03/29/2017   AST 21 03/29/2017   NA 136 03/29/2017   K 4.5 03/29/2017   CL 100 03/29/2017   CREATININE 0.85 03/29/2017   BUN 10 03/29/2017   CO2 29 03/29/2017   TSH 2.78 03/29/2017   HGBA1C 5.9 09/25/2016   MICROALBUR 2.5 (H) 09/15/2014    Mr Brain W Wo Contrast  Result Date: 11/03/2016  [facility] [address] [place], [place] [address] [phone] NEUROIMAGING REPORT STUDY DATE: 06/04/2017 PATIENT NAME: Cassandra Rios DOB: 07/13/1950 MRN: [id number] EXAM: MRI Brain with and without contrast ORDERING CLINICIAN: Ward Givens NP CLINICAL HISTORY: 66 year old woman with multiple sclerosis COMPARISON FILMS: None available TECHNIQUE:MRI of the brain with and without contrast was obtained utilizing 5 mm axial slices with T1, T2, T2 flair, SWI and diffusion weighted views.  T1 sagittal, T2 coronal and postcontrast views in the axial and coronal plane were obtained. CONTRAST: 20 ml Multihance IMAGING SITE: [employer], [place]. FINDINGS: On sagittal images, the spinal cord is imaged  caudally to C3 and is normal in caliber.   The contents of the posterior fossa are of normal size and position.   Pituitary tissue is flattened within a mildly enlarged sella turcica consistent with a "partially empty sella". The optic chiasm appear normal.    Brain volume appears normal.   The ventricles are normal in size and without distortion.  There are no abnormal extra-axial collections of fluid.  The cerebellum and brainstem appears normal.   The deep gray matter appears normal.  In the hemispheres, there are multiple T2/FLAIR hyperintense foci in the periventricular, juxtacortical and deep white matter. Some of the periventricular foci are radially oriented to the ventricles. Some foci are hypointense on T1-weighted images. None of the foci appeared to be acute..   Diffusion weighted images are normal.  Susceptibility weighted images are normal.   The orbits appear normal.   The VIIth/VIIIth nerve complex appears normal.  The mastoid air cells appear normal.  The paranasal sinuses appear normal.  Flow voids are identified within the major intracerebral arteries.  After  Effort normal and breath sounds normal. No respiratory distress. She has no wheezes. She has no rales. She exhibits no tenderness.  Abdominal: Soft. She exhibits no distension. There is no tenderness. There is no rebound, no guarding and no CVA tenderness.  Genitourinary: Pelvic exam was performed with patient supine. There is no rash, tenderness,  lesion or injury on the right labia. There is no rash, tenderness, lesion or injury on the left labia. No erythema or tenderness in the vagina. No vaginal discharge found.  Musculoskeletal: She exhibits no edema.  Neurological: She is alert and oriented to person, place, and time.  Psychiatric: She has a normal mood and affect.  Nursing note and vitals reviewed.  BP 118/80   Temp 97.7 F (36.5 C) (Oral)   Ht '5\' 6"'$  (1.676 m)   Wt 252 lb (114.3 kg)   BMI 40.67 kg/m  Wt Readings from Last 3 Encounters:  03/29/17 252 lb (114.3 kg)  03/19/17 248 lb (112.5 kg)  02/08/17 251 lb 6.4 oz (114 kg)     Lab Results  Component Value Date   WBC 5.9 03/29/2017   HGB 14.0 03/29/2017   HCT 41.9 03/29/2017   PLT 130.0 (L) 03/29/2017   GLUCOSE 105 (H) 03/29/2017   CHOL 158 03/29/2017   TRIG 129.0 03/29/2017   HDL 54.80 03/29/2017   LDLCALC 77 03/29/2017   ALT 18 03/29/2017   AST 21 03/29/2017   NA 136 03/29/2017   K 4.5 03/29/2017   CL 100 03/29/2017   CREATININE 0.85 03/29/2017   BUN 10 03/29/2017   CO2 29 03/29/2017   TSH 2.78 03/29/2017   HGBA1C 5.9 09/25/2016   MICROALBUR 2.5 (H) 09/15/2014    Mr Brain W Wo Contrast  Result Date: 11/03/2016  [facility] [address] [place], [place] [address] [phone] NEUROIMAGING REPORT STUDY DATE: 06/04/2017 PATIENT NAME: Cassandra Rios DOB: 07/13/1950 MRN: [id number] EXAM: MRI Brain with and without contrast ORDERING CLINICIAN: Ward Givens NP CLINICAL HISTORY: 66 year old woman with multiple sclerosis COMPARISON FILMS: None available TECHNIQUE:MRI of the brain with and without contrast was obtained utilizing 5 mm axial slices with T1, T2, T2 flair, SWI and diffusion weighted views.  T1 sagittal, T2 coronal and postcontrast views in the axial and coronal plane were obtained. CONTRAST: 20 ml Multihance IMAGING SITE: [employer], [place]. FINDINGS: On sagittal images, the spinal cord is imaged  caudally to C3 and is normal in caliber.   The contents of the posterior fossa are of normal size and position.   Pituitary tissue is flattened within a mildly enlarged sella turcica consistent with a "partially empty sella". The optic chiasm appear normal.    Brain volume appears normal.   The ventricles are normal in size and without distortion.  There are no abnormal extra-axial collections of fluid.  The cerebellum and brainstem appears normal.   The deep gray matter appears normal.  In the hemispheres, there are multiple T2/FLAIR hyperintense foci in the periventricular, juxtacortical and deep white matter. Some of the periventricular foci are radially oriented to the ventricles. Some foci are hypointense on T1-weighted images. None of the foci appeared to be acute..   Diffusion weighted images are normal.  Susceptibility weighted images are normal.   The orbits appear normal.   The VIIth/VIIIth nerve complex appears normal.  The mastoid air cells appear normal.  The paranasal sinuses appear normal.  Flow voids are identified within the major intracerebral arteries.  After

## 2017-03-29 NOTE — Patient Instructions (Signed)

## 2017-03-29 NOTE — Assessment & Plan Note (Signed)
Tolerating statin, encouraged heart healthy diet, avoid trans fats, minimize simple carbs and saturated fats. Increase exercise as tolerated 

## 2017-03-29 NOTE — Assessment & Plan Note (Signed)
Stable Check labs  con't meds

## 2017-04-02 ENCOUNTER — Other Ambulatory Visit: Payer: Self-pay | Admitting: Family Medicine

## 2017-04-02 DIAGNOSIS — D696 Thrombocytopenia, unspecified: Secondary | ICD-10-CM

## 2017-04-02 DIAGNOSIS — E785 Hyperlipidemia, unspecified: Secondary | ICD-10-CM

## 2017-04-04 ENCOUNTER — Other Ambulatory Visit: Payer: Self-pay | Admitting: Family Medicine

## 2017-04-04 DIAGNOSIS — Z1231 Encounter for screening mammogram for malignant neoplasm of breast: Secondary | ICD-10-CM

## 2017-04-09 ENCOUNTER — Encounter (HOSPITAL_COMMUNITY): Payer: Self-pay

## 2017-04-12 DIAGNOSIS — M5136 Other intervertebral disc degeneration, lumbar region: Secondary | ICD-10-CM | POA: Diagnosis not present

## 2017-04-12 DIAGNOSIS — M47816 Spondylosis without myelopathy or radiculopathy, lumbar region: Secondary | ICD-10-CM | POA: Diagnosis not present

## 2017-04-12 DIAGNOSIS — Z79891 Long term (current) use of opiate analgesic: Secondary | ICD-10-CM | POA: Diagnosis not present

## 2017-04-13 ENCOUNTER — Other Ambulatory Visit: Payer: Self-pay | Admitting: Family Medicine

## 2017-04-13 NOTE — Telephone Encounter (Signed)
Both Rx refilled 03/29/17 1 po qd  Quantity 90 w/1 refill Refill denied

## 2017-04-18 ENCOUNTER — Other Ambulatory Visit: Payer: Self-pay | Admitting: Family Medicine

## 2017-04-28 ENCOUNTER — Other Ambulatory Visit: Payer: Self-pay | Admitting: Family Medicine

## 2017-05-01 ENCOUNTER — Telehealth (HOSPITAL_COMMUNITY): Payer: Self-pay

## 2017-05-01 NOTE — Telephone Encounter (Signed)
Declined [facility] appt This patient is overdue for recommended follow-up with a Ambulance person at [facility]. A letter was mailed to the address on file 11.5.18 from both [facility] in attempt to reestablish post-op care. The letter included a patient survey which was returned to the [facility] today.  Patient declined an appointment advising current insurance does not cover bariatric follow-up.  Copy of the survey was shared with both [facility] as well as Mallory Shirk at [facility] so she may file in patients office chart.

## 2017-05-02 ENCOUNTER — Other Ambulatory Visit: Payer: PPO

## 2017-05-02 ENCOUNTER — Ambulatory Visit: Payer: PPO

## 2017-05-31 ENCOUNTER — Ambulatory Visit
Admission: RE | Admit: 2017-05-31 | Discharge: 2017-05-31 | Disposition: A | Discharge status: Home / Self Care | Payer: PPO | Source: Ambulatory Visit | Attending: Family Medicine | Admitting: Family Medicine

## 2017-05-31 DIAGNOSIS — Z1231 Encounter for screening mammogram for malignant neoplasm of breast: Secondary | ICD-10-CM

## 2017-05-31 DIAGNOSIS — M810 Age-related osteoporosis without current pathological fracture: Secondary | ICD-10-CM | POA: Diagnosis not present

## 2017-05-31 DIAGNOSIS — E2839 Other primary ovarian failure: Secondary | ICD-10-CM

## 2017-05-31 DIAGNOSIS — Z780 Asymptomatic menopausal state: Secondary | ICD-10-CM | POA: Diagnosis not present

## 2017-06-08 DIAGNOSIS — L292 Pruritus vulvae: Secondary | ICD-10-CM | POA: Diagnosis not present

## 2017-06-08 DIAGNOSIS — L900 Lichen sclerosus et atrophicus: Secondary | ICD-10-CM | POA: Diagnosis not present

## 2017-07-24 ENCOUNTER — Telehealth: Payer: Self-pay | Admitting: Family Medicine

## 2017-07-24 MED ORDER — OSELTAMIVIR PHOSPHATE 75 MG PO CAPS: 75.0000 mg | ORAL_CAPSULE | Freq: Two times a day (BID) | ORAL | 0 refills | Status: DC

## 2017-07-24 NOTE — Telephone Encounter (Signed)
Copied from [place]. Topic: Quick Communication - See Telephone Encounter >> Jul 24, 2017 10:35 AM Bea Graff, NT wrote: CRM for notification. See Telephone encounter for: Pt would like to see if something can be called in for her. She has the flu and she does not want to come in for an appt and get everyone exposed to the flu. Uses [facility] on [address].   07/24/17.

## 2017-07-24 NOTE — Telephone Encounter (Signed)
Pt states recent symptoms Temp-102, Coughing, Sore throat, Chills, and Body Aches that all started yesterday evening.

## 2017-07-24 NOTE — Telephone Encounter (Signed)
tamiflu 75 mg bid x 5 days  If symptoms do not improve she will need ov

## 2017-07-24 NOTE — Telephone Encounter (Signed)
Rx sent pt has been notified

## 2017-08-24 DIAGNOSIS — L900 Lichen sclerosus et atrophicus: Secondary | ICD-10-CM | POA: Diagnosis not present

## 2017-08-24 DIAGNOSIS — L292 Pruritus vulvae: Secondary | ICD-10-CM | POA: Diagnosis not present

## 2017-08-24 DIAGNOSIS — R208 Other disturbances of skin sensation: Secondary | ICD-10-CM | POA: Diagnosis not present

## 2017-09-10 ENCOUNTER — Telehealth: Payer: Self-pay | Admitting: Adult Health

## 2017-09-10 ENCOUNTER — Encounter: Payer: Self-pay | Admitting: Adult Health

## 2017-09-10 ENCOUNTER — Ambulatory Visit: Payer: PPO | Admitting: Adult Health

## 2017-09-10 DIAGNOSIS — G35 Multiple sclerosis: Secondary | ICD-10-CM | POA: Diagnosis not present

## 2017-09-10 NOTE — Patient Instructions (Addendum)
Your Plan:  Continue using Betaseron Blood work today  Thank you for coming to see [name] at [facility]. I hope we have been able to provide you high quality care today.  You may receive a patient satisfaction survey over the next few weeks. We would appreciate your feedback and comments so that we may continue to improve ourselves and the health of our patients.

## 2017-09-10 NOTE — Progress Notes (Signed)
PATIENT: Cassandra Rios DOB: 02/05/1951  REASON FOR VISIT: follow up HISTORY FROM: patient  HISTORY OF PRESENT ILLNESS: Today 09/10/17 Ms. Cassandra Rios is a 67 year old female with a history of multiple sclerosis.  She returns today for follow-up.  She continues on Betaseron and is tolerating it well.  She states over the last year she feels like her symptoms have gotten worse.  She has noticed a change in her strength primarily in her legs.  She states that if she is walking upstairs she has to havea railing in order to get up the steps.  She states that there are times that she is walking and she will become off balance.  She states that if she changes directions quickly this will also cause her to feel off balance.  She denies any changes with bowel or bladder.  Denies any significant changes in her vision.  Reports that she does have cataracts and will be seeing her ophthalmologist next month.  She also reports tingling in the lower extremities.  She states that this is a newer symptom and has been going on for several months.  She returns today for an evaluation.  HISTORY Cassandra Rios is a 67 year old female with a history of multiple sclerosis. She returns today for follow-up. Overall she feels that she has remained stable. She remains on Betaseron. Denies any significant changes with her gait or balance. She states that over the years she does feel that her balance has gotten slightly worse. She denies any falls. Denies any new numbness or weakness. She does state again that over the years her grip strength has gotten worse. She does find that she drops things frequently. Denies any changes with the bowels or bladder. She does note constipation if she takes her pain medication. She states that she has an appointment at the end of the month to have her vision checked. Reports that there has been some changes that she feels are related to cataracts. She returns today for an evaluation.   REVIEW  OF SYSTEMS: Out of a complete 14 system review of symptoms, the patient complains only of the following symptoms, and all other reviewed systems are negative.  Chills, eye itching, eye redness, eye pain, chest pain, joint pain, back pain, walking difficulty, neck pain, neck stiffness, itching, daytime sleepiness, frequency of urination, dizziness, headache, bruise/bleed easily  ALLERGIES: Allergies  Allergen Reactions  . Codeine   . Hydromorphone Hcl     REACTION: nausea/vomiting  . Ibuprofen     REACTION: stomach ulcers  . Morphine Sulfate   . Metronidazole Hives and Rash    HOME MEDICATIONS: Outpatient Medications Prior to Visit  Medication Sig Dispense Refill  . atorvastatin (LIPITOR) 20 MG tablet TAKE 1 TABLET BY MOUTH EVERY DAY 90 tablet 1  . Calcium Carbonate-Vitamin D (CALCIUM 600 + D PO) Take 1 tablet by mouth daily.     Marland Kitchen conjugated estrogens (PREMARIN) vaginal cream Place 1 Applicatorful vaginally daily. 42.5 g 12  . cyclobenzaprine (FLEXERIL) 10 MG tablet Take 1 tablet (10 mg total) by mouth 3 (three) times daily as needed for muscle spasms. 30 tablet 0  . fluocinonide ointment (LIDEX) 0.05 % APPLY TO AFFECTED AREA TWICE A DAY UNTIL NEXT VISIT  3  . FLUoxetine (PROZAC) 40 MG capsule TAKE 1 CAPSULE (40 MG TOTAL) BY MOUTH DAILY. 90 capsule 1  . gabapentin (NEURONTIN) 100 MG capsule 300 mg every evening.    Marland Kitchen HYDROcodone-acetaminophen (NORCO/VICODIN) 5-325 MG per tablet Take  1 tablet by mouth every 6 (six) hours as needed for moderate pain.    . Interferon Beta-1b (BETASERON) 0.3 MG KIT injection RECONSTITUTE AS DIRECTED AND INJECT 0.25MG  (= ) SUBCUTANEOUSLY ONCE EVERY OTHER DAY AS DIRECTED. 36 each 3  . levocetirizine (XYZAL) 5 MG tablet Take 1 tablet (5 mg total) by mouth at bedtime as needed for allergies. 90 tablet 1  . levothyroxine (SYNTHROID, LEVOTHROID) 125 MCG tablet Take 1 tablet (125 mcg total) by mouth daily. 90 tablet 1  . losartan-hydrochlorothiazide (HYZAAR)  100-12.5 MG tablet Take 1 tablet by mouth daily. 90 tablet 1  . mometasone (NASONEX) 50 MCG/ACT nasal spray Place 2 sprays into the nose daily as needed (allergies). 17 g 5  . Multiple Vitamin (MULTIVITAMIN) tablet Take 1 tablet by mouth 2 (two) times daily.      . traMADol (ULTRAM) 50 MG tablet Take 1 tablet (50 mg total) by mouth every 6 (six) hours as needed. 30 tablet 1  . aspirin 81 MG tablet Take 81 mg by mouth daily.    Marland Kitchen atorvastatin (LIPITOR) 20 MG tablet TAKE 1 TABLET (20 MG TOTAL) BY MOUTH DAILY. 90 tablet 1  . celecoxib (CELEBREX) 200 MG capsule Take 200 mg by mouth daily as needed (inflammation).    . clobetasol ointment (TEMOVATE) 0.05 % APPLY TO AFFECTED AREA TWICE A DAY 30 g 3  . FLUoxetine (PROZAC) 40 MG capsule TAKE 1 CAPSULE BY MOUTH EVERY DAY 90 capsule 1  . losartan-hydrochlorothiazide (HYZAAR) 100-12.5 MG tablet TAKE 1 TABLET BY MOUTH EVERY DAY 90 tablet 1  . oseltamivir (TAMIFLU) 75 MG capsule Take 1 capsule (75 mg total) by mouth 2 (two) times daily. For 5 days 10 capsule 0   No facility-administered medications prior to visit.     PAST MEDICAL HISTORY: Past Medical History:  Diagnosis Date  . AAA (abdominal aortic aneurysm) (HCC)   . Allergic rhinitis   . Chronic hoarseness   . Colon polyps   . COPD (chronic obstructive pulmonary disease) (HCC)    Dr. Marchelle Gearing  . Dyslipidemia   . Hypertension   . Hypothyroidism   . MS (multiple sclerosis) (HCC)   . Obesity   . Stomach ulcer from aspirin/ibuprofen-like drugs (NSAID's)     PAST SURGICAL HISTORY: Past Surgical History:  Procedure Laterality Date  . ABDOMINAL HYSTERECTOMY    . BARIATRIC SURGERY  09/07/08   lap band  . CARPAL TUNNEL RELEASE    . CHOLECYSTECTOMY    . CHOLECYSTECTOMY    . HIATAL HERNIA REPAIR  09/07/08    FAMILY HISTORY: Family History  Problem Relation Age of Onset  . COPD Father   . Heart disease Father   . AAA (abdominal aortic aneurysm) Father   . COPD Sister   . Arthritis Sister    . AAA (abdominal aortic aneurysm) Sister   . Dementia Sister   . Other Sister        brain injury  . Stroke Mother 65  . Heart disease Mother        congenital heart defect?    SOCIAL HISTORY: Social History   Socioeconomic History  . Marital status: Married    Spouse name: Casimiro Needle  . Number of children: 2  . Years of education: 47  . Highest education level: Not on file  Occupational History  . Occupation: retired  Engineer, production  . Financial resource strain: Not on file  . Food insecurity:    Worry: Not on file    Inability:  Not on file  . Transportation needs:    Medical: Not on file    Non-medical: Not on file  Tobacco Use  . Smoking status: Former Smoker    Packs/day: 2.00    Years: 35.00    Pack years: 70.00    Types: Cigarettes    Last attempt to quit: 04/05/2007    Years since quitting: 10.4  . Smokeless tobacco: Never Used  Substance and Sexual Activity  . Alcohol use: No    Comment: very rare  . Drug use: No  . Sexual activity: Never    Partners: Male  Lifestyle  . Physical activity:    Days per week: Not on file    Minutes per session: Not on file  . Stress: Not on file  Relationships  . Social connections:    Talks on phone: Not on file    Gets together: Not on file    Attends religious service: Not on file    Active member of club or organization: Not on file    Attends meetings of clubs or organizations: Not on file    Relationship status: Not on file  . Intimate partner violence:    Fear of current or ex partner: Not on file    Emotionally abused: Not on file    Physically abused: Not on file    Forced sexual activity: Not on file  Other Topics Concern  . Not on file  Social History Narrative   Patient is right handed,reside in home with husband      PHYSICAL EXAM  Vitals:   09/10/17 1015  BP: 117/65  Pulse: 83  Weight: 250 lb (113.4 kg)  Height: 5\' 6"  (1.676 m)   Body mass index is 40.35 kg/m.  Generalized: Well  developed, in no acute distress   Neurological examination  Mentation: Alert oriented to time, place, history taking. Follows all commands speech and language fluent Cranial nerve II-XII: Pupils were equal round reactive to light. Extraocular movements were full, visual field were full on confrontational test. Facial sensation and strength were normal. Uvula tongue midline. Head turning and shoulder shrug  were normal and symmetric. Motor: The motor testing reveals 5 over 5 strength of all 4 extremities. Good symmetric motor tone is noted throughout.  Sensory: Sensory testing is intact to soft touch on all 4 extremities. No evidence of extinction is noted.  Coordination: Cerebellar testing reveals good finger-nose-finger and heel-to-shin bilaterally.  Gait and station: Gait is normal. Reflexes: Deep tendon reflexes are symmetric and normal bilaterally.   DIAGNOSTIC DATA (LABS, IMAGING, TESTING) - I reviewed patient records, labs, notes, testing and imaging myself where available.  Lab Results  Component Value Date   WBC 5.9 03/29/2017   HGB 14.0 03/29/2017   HCT 41.9 03/29/2017   MCV 91.4 03/29/2017   PLT 130.0 (L) 03/29/2017      Component Value Date/Time   NA 136 03/29/2017 0914   NA 139 09/05/2016 0944   K 4.5 03/29/2017 0914   CL 100 03/29/2017 0914   CO2 29 03/29/2017 0914   GLUCOSE 105 (H) 03/29/2017 0914   BUN 10 03/29/2017 0914   BUN 13 09/05/2016 0944   CREATININE 0.85 03/29/2017 0914   CREATININE 1.04 05/13/2014 0840   CALCIUM 9.4 03/29/2017 0914   PROT 6.7 03/29/2017 0914   PROT 6.7 09/05/2016 0944   ALBUMIN 3.7 03/29/2017 0914   ALBUMIN 4.0 09/05/2016 0944   AST 21 03/29/2017 0914   ALT 18 03/29/2017 0914  ALKPHOS 63 03/29/2017 0914   BILITOT 0.5 03/29/2017 0914   BILITOT 0.4 09/05/2016 0944   GFRNONAA 67 09/05/2016 0944   GFRNONAA 57 (L) 05/13/2014 0840   GFRAA 77 09/05/2016 0944   GFRAA 66 05/13/2014 0840   Lab Results  Component Value Date   CHOL 158  03/29/2017   HDL 54.80 03/29/2017   LDLCALC 77 03/29/2017   TRIG 129.0 03/29/2017   CHOLHDL 3 03/29/2017   Lab Results  Component Value Date   HGBA1C 5.9 09/25/2016   Lab Results  Component Value Date   VITAMINB12 648 03/16/2010   Lab Results  Component Value Date   TSH 2.78 03/29/2017      ASSESSMENT AND PLAN 67 y.o. year old female  has a past medical history of AAA (abdominal aortic aneurysm) (HCC), Allergic rhinitis, Chronic hoarseness, Colon polyps, COPD (chronic obstructive pulmonary disease) (HCC), Dyslipidemia, Hypertension, Hypothyroidism, MS (multiple sclerosis) (HCC), Obesity, and Stomach ulcer from aspirin/ibuprofen-like drugs (NSAID's). here with:  1.  Multiple sclerosis  The patient will continue on Betaseron.  Over the last year she does feel that her symptoms may have gotten slightly worse.  We will repeat an MRI of the brain to look for any acute changes.  She will have blood work at the end of the month with her primary care.  I have asked that these results be sent to [name] as well.  Patient advised that if her symptoms worsen or she develops new symptoms she should let us know.  She will follow-up in 6 months or sooner if needed.   I spent 15 minutes with the patient. 50% of this time was spent discussing symptoms and plan of care   Butch Penny, MSN, NP-C 09/10/2017, 10:29 AM [facility] [address], [address] [place], [place] [address] [phone]

## 2017-09-10 NOTE — Telephone Encounter (Signed)
Health team faxed clinical notes order sent to GI, they will reach out to the pt to schedule.

## 2017-09-10 NOTE — Progress Notes (Signed)
I have read the note, and I agree with the clinical assessment and plan.  Cassandra Rios Cassandra Rios   

## 2017-09-11 NOTE — Telephone Encounter (Signed)
Health team auth: [id number] (exp. 09/10/17 to 12/09/17)

## 2017-09-21 ENCOUNTER — Ambulatory Visit
Admission: RE | Admit: 2017-09-21 | Discharge: 2017-09-21 | Disposition: A | Discharge status: Home / Self Care | Payer: PPO | Source: Ambulatory Visit | Attending: Adult Health | Admitting: Adult Health

## 2017-09-21 DIAGNOSIS — G35 Multiple sclerosis: Secondary | ICD-10-CM

## 2017-09-21 MED ORDER — GADOBENATE DIMEGLUMINE 529 MG/ML IV SOLN
20.0000 mL | Freq: Once | INTRAVENOUS | Status: AC | PRN
  Administered 2017-09-21: 20 mL via INTRAVENOUS

## 2017-09-24 ENCOUNTER — Telehealth: Payer: Self-pay | Admitting: Neurology

## 2017-09-24 NOTE — Telephone Encounter (Signed)
-----   Message from Brandon Melnick, RN sent at 09/24/2017 10:51 AM EDT -----   ----- Message ----- From: Britt Bottom, MD Sent: 09/21/2017  10:25 AM To: Ward Givens, NP

## 2017-09-24 NOTE — Telephone Encounter (Signed)
I called the patient.  MRI of the brain does not show any change from 1 year ago, the patient has had a gradual change in balance, she does not have to use a cane or a walker.  I have asked her to get involved with Pilates or yoga to help improve balance.  If this process continues, we may need to consider a change from Betaseron to another medication such as Ocrevus.  MRI brain 09/24/17:  IMPRESSION: This MRI of the brain with and without contrast shows the following: 1.    There are multiple T2/FLAIR hyperintense foci in the hemispheres in a pattern and configuration consistent with chronic demyelinating plaque.  Some of the foci could also represent chronic microvascular ischemic changes.  When compared to the MRI dated 11/02/2016, there is no interval change.  2.    There is a "partially empty sella turcica".   This is a nonspecific finding and could be incidental or related to elevated intracranial pressures.  When compared to the MRI dated 11/02/2016, there is no change.   3.    There is a normal enhancement pattern and there are no acute findings.

## 2017-09-25 NOTE — Progress Notes (Signed)
Subjective:   Cassandra Rios is a 67 y.o. female who presents for Medicare Annual (Subsequent) preventive examination.  Review of Systems: No ROS.  Medicare Wellness Visit. Additional risk factors are reflected in the social history.  Cardiac Risk Factors include: advanced age (>39mn, >>55women);dyslipidemia;hypertension;obesity (BMI >30kg/m2);sedentary lifestyle Sleep patterns:Wakes once to urinate. Sleeps 7-8 hrs. Naps as needed. Home Safety/Smoke Alarms: Feels safe in home. Smoke alarms in place.  Living environment; residence and Firearm Safety: Lives with husband. No stairs.  .   Female:   Pap- 2015-normal      Mammo-utd       Dexa scan-utd        CCS-05/18/14. Recall 5 yrs.     Objective:     Vitals: BP 130/70 (BP Location: Left Arm, Patient Position: Sitting, Cuff Size: Normal) Comment: Done by CMA in today's PCP appt _0   Pulse 62   Ht 5' 6" (1.676 m)   Wt 251 lb (113.9 kg)   SpO2 97%   BMI 40.51 kg/m   Body mass index is 40.51 kg/m.  Advanced Directives 09/27/2017 09/25/2016 06/06/2016  Does Patient Have a Medical Advance Directive? No Yes -  Type of Advance Directive - Living will;Healthcare Power of Attorney Living will  [place]? - No - copy requested -  Would patient like information on creating a medical advance directive? No - Patient declined - -    Tobacco Social History   Tobacco Use  Smoking Status Former Smoker  . Packs/day: 2.00  . Years: 35.00  . Pack years: 70.00  . Types: Cigarettes  . Last attempt to quit: 04/05/2007  . Years since quitting: 10.4  Smokeless Tobacco Never Used     Counseling given: Not Answered   Clinical Intake: Pain : No/denies pain   Past Medical History:  Diagnosis Date  . AAA (abdominal aortic aneurysm) ([place]   . Allergic rhinitis   . Chronic hoarseness   . Colon polyps   . COPD (chronic obstructive pulmonary disease) (HCC)    Dr. RChase Caller . Dyslipidemia   .  Hypertension   . Hypothyroidism   . MS (multiple sclerosis) ([place]   . Obesity   . Stomach ulcer from aspirin/ibuprofen-like drugs (NSAID's)    Past Surgical History:  Procedure Laterality Date  . ABDOMINAL HYSTERECTOMY    . BARIATRIC SURGERY  09/07/08   lap band  . CARPAL TUNNEL RELEASE    . CHOLECYSTECTOMY    . CHOLECYSTECTOMY    . HIATAL HERNIA REPAIR  09/07/08   Family History  Problem Relation Age of Onset  . COPD Father   . Heart disease Father   . AAA (abdominal aortic aneurysm) Father   . COPD Sister   . Arthritis Sister   . AAA (abdominal aortic aneurysm) Sister   . Dementia Sister   . Other Sister        brain injury  . Stroke Mother 734 . Heart disease Mother        congenital heart defect?   Social History   Socioeconomic History  . Marital status: Married    Spouse name: MLegrand Como . Number of children: 2  . Years of education: 160 . Highest education level: Not on file  Occupational History  . Occupation: retired  SScientific laboratory technician . Financial resource strain: Not on file  . Food insecurity:    Worry: Not on file    Inability: Not on file  .  Transportation needs:    Medical: Not on file    Non-medical: Not on file  Tobacco Use  . Smoking status: Former Smoker    Packs/day: 2.00    Years: 35.00    Pack years: 70.00    Types: Cigarettes    Last attempt to quit: 04/05/2007    Years since quitting: 10.4  . Smokeless tobacco: Never Used  Substance and Sexual Activity  . Alcohol use: No    Comment: very rare  . Drug use: No  . Sexual activity: Never    Partners: Male  Lifestyle  . Physical activity:    Days per week: Not on file    Minutes per session: Not on file  . Stress: Not on file  Relationships  . Social connections:    Talks on phone: Not on file    Gets together: Not on file    Attends religious service: Not on file    Active member of club or organization: Not on file    Attends meetings of clubs or organizations: Not on file     Relationship status: Not on file  Other Topics Concern  . Not on file  Social History Narrative   Patient is right handed,reside in home with husband    Outpatient Encounter Medications as of 09/27/2017  Medication Sig  . atorvastatin (LIPITOR) 20 MG tablet TAKE 1 TABLET BY MOUTH EVERY DAY  . Calcium Carbonate-Vitamin D (CALCIUM 600 + D PO) Take 1 tablet by mouth daily.   . cyclobenzaprine (FLEXERIL) 10 MG tablet Take 1 tablet (10 mg total) by mouth 3 (three) times daily as needed for muscle spasms.  . fluocinonide ointment (LIDEX) 0.05 % APPLY TO AFFECTED AREA TWICE A DAY UNTIL NEXT VISIT  . FLUoxetine (PROZAC) 40 MG capsule TAKE 1 CAPSULE (40 MG TOTAL) BY MOUTH DAILY.  Marland Kitchen gabapentin (NEURONTIN) 100 MG capsule 300 mg every evening.  Marland Kitchen HYDROcodone-acetaminophen (NORCO/VICODIN) 5-325 MG per tablet Take 1 tablet by mouth every 6 (six) hours as needed for moderate pain.  . Interferon Beta-1b (BETASERON) 0.3 MG KIT injection RECONSTITUTE AS DIRECTED AND INJECT 0.25MG (= 1ML) SUBCUTANEOUSLY ONCE EVERY OTHER DAY AS DIRECTED.  Marland Kitchen levocetirizine (XYZAL) 5 MG tablet Take 1 tablet (5 mg total) by mouth at bedtime as needed for allergies.  Marland Kitchen levothyroxine (SYNTHROID, LEVOTHROID) 125 MCG tablet Take 1 tablet (125 mcg total) by mouth daily.  . mometasone (NASONEX) 50 MCG/ACT nasal spray Place 2 sprays into the nose daily as needed (allergies).  . Multiple Vitamin (MULTIVITAMIN) tablet Take 1 tablet by mouth 2 (two) times daily.    . [DISCONTINUED] conjugated estrogens (PREMARIN) vaginal cream Place 1 Applicatorful vaginally daily.  . [DISCONTINUED] losartan-hydrochlorothiazide (HYZAAR) 100-12.5 MG tablet Take 1 tablet by mouth daily.  . [DISCONTINUED] traMADol (ULTRAM) 50 MG tablet Take 1 tablet (50 mg total) by mouth every 6 (six) hours as needed.   No facility-administered encounter medications on file as of 09/27/2017.     Activities of Daily Living In your present state of health, do you have any  difficulty performing the following activities: 09/27/2017  Hearing? N  Vision? N  Comment wearing glasses. Dr.TAnner next week appt.   Difficulty concentrating or making decisions? N  Walking or climbing stairs? N  Dressing or bathing? N  Doing errands, shopping? N  Preparing Food and eating ? N  Using the Toilet? N  In the past six months, have you accidently leaked urine? Y  Do you have problems with loss  of bowel control? N  Managing your Medications? N  Managing your Finances? N  Housekeeping or managing your Housekeeping? N  Some recent data might be hidden    Patient Care Team: Carollee Herter, Alferd Apa, DO as PCP - General Marygrace Drought, MD as Consulting Physician (Ophthalmology) Truett Mainland, DO as Consulting Physician (Gynecology) Ward Givens, NP as Registered Nurse (Neurology) Brand Males, MD as Consulting Physician (Pulmonary Disease) Belva Crome, MD as Consulting Physician (Cardiology)    Assessment:   This is a routine wellness examination for Cassandra Rios. Physical assessment deferred to PCP. Exercise Activities and Dietary recommendations Current Exercise Habits: The patient does not participate in regular exercise at present, Exercise limited by: None identified   Diet (meal preparation, eat out, water intake, caffeinated beverages, dairy products, fruits and vegetables): 24 hour recall Breakfast: grits, toast, 3 bacon Lunch: fried chicken tenders and baked apples Dinner: ham     Drinks 2-3 glasses of water per day  Goals    . Take beach trip with best friends and maintain current health       Fall Risk Fall Risk  09/27/2017 09/25/2016 03/27/2016 03/23/2015  Falls in the past year? No No No No   Depression Screen PHQ 2/9 Scores 09/27/2017 09/25/2016 03/27/2016 03/23/2015  PHQ - 2 Score 0 0 0 0     Cognitive Function MMSE - Mini Mental State Exam 09/25/2016  Orientation to time 5  Orientation to Place 5  Registration 3  Attention/  Calculation 5  Recall 3  Language- name 2 objects 2  Language- repeat 1  Language- follow 3 step command 3  Language- read & follow direction 1  Write a sentence 1  Copy design 1  Total score 30        Immunization History  Administered Date(s) Administered  . H1N1 07/20/2008  . Influenza Whole 02/26/2008, 03/04/2010, 02/27/2013  . Influenza, High Dose Seasonal PF 04/01/2015, 03/27/2016  . Pneumococcal Conjugate-13 04/01/2015  . Pneumococcal Polysaccharide-23 07/20/2008, 03/07/2011  . Tdap 03/07/2011  . Zoster 02/28/2010   Screening Tests Health Maintenance  Topic Date Due  . PNA vac Low Risk Adult (2 of 2 - PPSV23) 03/31/2016  . COLONOSCOPY  05/18/2017  . INFLUENZA VACCINE  01/03/2018  . MAMMOGRAM  05/31/2018  . TETANUS/TDAP  03/06/2021  . DEXA SCAN  Completed  . Hepatitis C Screening  Completed      Plan:   Follow up with PCP as directed  Continue to eat heart healthy diet (full of fruits, vegetables, whole grains, lean protein, water--limit salt, fat, and sugar intake) and increase physical activity as tolerated.  Continue doing brain stimulating activities (puzzles, reading, adult coloring books, staying active) to keep memory sharp.    I have personally reviewed and noted the following in the patient's chart:   . Medical and social history . Use of alcohol, tobacco or illicit drugs  . Current medications and supplements . Functional ability and status . Nutritional status . Physical activity . Advanced directives . List of other physicians . Hospitalizations, surgeries, and ER visits in previous 12 months . Vitals . Screenings to include cognitive, depression, and falls . Referrals and appointments  In addition, I have reviewed and discussed with patient certain preventive protocols, quality metrics, and best practice recommendations. A written personalized care plan for preventive services as well as general preventive health recommendations were  provided to patient.     Naaman Plummer [place], [place]  09/27/2017

## 2017-09-26 ENCOUNTER — Ambulatory Visit: Payer: PPO | Admitting: *Deleted

## 2017-09-27 ENCOUNTER — Ambulatory Visit (INDEPENDENT_AMBULATORY_CARE_PROVIDER_SITE_OTHER): Payer: PPO | Admitting: Family Medicine

## 2017-09-27 ENCOUNTER — Other Ambulatory Visit: Payer: PPO

## 2017-09-27 ENCOUNTER — Other Ambulatory Visit: Payer: Self-pay | Admitting: Emergency Medicine

## 2017-09-27 ENCOUNTER — Ambulatory Visit (INDEPENDENT_AMBULATORY_CARE_PROVIDER_SITE_OTHER): Payer: PPO | Admitting: *Deleted

## 2017-09-27 ENCOUNTER — Encounter: Payer: Self-pay | Admitting: Family Medicine

## 2017-09-27 VITALS — BP 130/70 | HR 62 | Ht 66.0 in | Wt 251.0 lb

## 2017-09-27 VITALS — BP 130/70 | HR 62 | Temp 97.5°F | Resp 16 | Ht 66.0 in | Wt 251.6 lb

## 2017-09-27 DIAGNOSIS — E785 Hyperlipidemia, unspecified: Secondary | ICD-10-CM | POA: Diagnosis not present

## 2017-09-27 DIAGNOSIS — G35 Multiple sclerosis: Secondary | ICD-10-CM

## 2017-09-27 DIAGNOSIS — E039 Hypothyroidism, unspecified: Secondary | ICD-10-CM | POA: Diagnosis not present

## 2017-09-27 DIAGNOSIS — Z79899 Other long term (current) drug therapy: Secondary | ICD-10-CM | POA: Diagnosis not present

## 2017-09-27 DIAGNOSIS — I10 Essential (primary) hypertension: Secondary | ICD-10-CM | POA: Diagnosis not present

## 2017-09-27 DIAGNOSIS — D696 Thrombocytopenia, unspecified: Secondary | ICD-10-CM

## 2017-09-27 DIAGNOSIS — N904 Leukoplakia of vulva: Secondary | ICD-10-CM

## 2017-09-27 DIAGNOSIS — R51 Headache: Secondary | ICD-10-CM

## 2017-09-27 DIAGNOSIS — R519 Headache, unspecified: Secondary | ICD-10-CM

## 2017-09-27 DIAGNOSIS — Z0000 Encounter for general adult medical examination without abnormal findings: Secondary | ICD-10-CM | POA: Diagnosis not present

## 2017-09-27 LAB — CBC WITH DIFFERENTIAL/PLATELET
Basophils Absolute: 0 10*3/uL (ref 0.0–0.1)
Basophils Relative: 0.4 % (ref 0.0–3.0)
EOS PCT: 2 % (ref 0.0–5.0)
Eosinophils Absolute: 0.1 10*3/uL (ref 0.0–0.7)
HCT: 42.7 % (ref 36.0–46.0)
Hemoglobin: 14.3 g/dL (ref 12.0–15.0)
LYMPHS ABS: 1.7 10*3/uL (ref 0.7–4.0)
Lymphocytes Relative: 31.8 % (ref 12.0–46.0)
MCHC: 33.4 g/dL (ref 30.0–36.0)
MCV: 88.6 fl (ref 78.0–100.0)
MONO ABS: 0.6 10*3/uL (ref 0.1–1.0)
Monocytes Relative: 11.6 % (ref 3.0–12.0)
NEUTROS ABS: 2.9 10*3/uL (ref 1.4–7.7)
NEUTROS PCT: 54.2 % (ref 43.0–77.0)
RBC: 4.81 Mil/uL (ref 3.87–5.11)
RDW: 13 % (ref 11.5–15.5)
WBC: 5.3 10*3/uL (ref 4.0–10.5)

## 2017-09-27 LAB — COMPREHENSIVE METABOLIC PANEL
ALK PHOS: 68 U/L (ref 39–117)
ALT: 17 U/L (ref 0–35)
AST: 22 U/L (ref 0–37)
Albumin: 3.7 g/dL (ref 3.5–5.2)
BUN: 11 mg/dL (ref 6–23)
CO2: 29 meq/L (ref 19–32)
Calcium: 9.3 mg/dL (ref 8.4–10.5)
Chloride: 100 mEq/L (ref 96–112)
Creatinine, Ser: 0.99 mg/dL (ref 0.40–1.20)
GFR: 59.35 mL/min — ABNORMAL LOW (ref >60.00)
GLUCOSE: 101 mg/dL — AB (ref 70–99)
POTASSIUM: 4 meq/L (ref 3.5–5.1)
SODIUM: 136 meq/L (ref 135–145)
Total Bilirubin: 0.5 mg/dL (ref 0.2–1.2)
Total Protein: 6.8 g/dL (ref 6.0–8.3)

## 2017-09-27 LAB — LIPID PANEL
CHOL/HDL RATIO: 3
Cholesterol: 167 mg/dL (ref 0–200)
HDL: 48.1 mg/dL (ref >39.00)
LDL Cholesterol: 91 mg/dL (ref 0–99)
NONHDL: 119.24
Triglycerides: 141 mg/dL (ref 0.0–149.0)
VLDL: 28.2 mg/dL (ref 0.0–40.0)

## 2017-09-27 LAB — TSH: TSH: 1.64 u[IU]/mL (ref 0.35–4.50)

## 2017-09-27 MED ORDER — TELMISARTAN-HCTZ 40-12.5 MG PO TABS: 1.0000 | ORAL_TABLET | Freq: Every day | ORAL | 2 refills | Status: DC

## 2017-09-27 MED ORDER — TRAMADOL HCL 50 MG PO TABS: 50.0000 mg | ORAL_TABLET | Freq: Four times a day (QID) | ORAL | 1 refills | Status: DC | PRN

## 2017-09-27 NOTE — Assessment & Plan Note (Signed)
Poorly controlled will alter medications, encouraged DASH diet, minimize caffeine and obtain adequate sleep. Report concerning symptoms and follow up as directed and as needed 

## 2017-09-27 NOTE — Progress Notes (Signed)
Patient ID: Cassandra Rios, female   DOB: 09-08-1950, 67 y.o.   MRN: [id number]    Subjective:  I acted as a Neurosurgeon for Dr. Zola Button.  Apolonio Schneiders, CMA   Patient ID: Cassandra Rios, female    DOB: [birth year], 67 y.o.   MRN: [id number]  Chief Complaint  Patient presents with  . Hypertension  . Hyperlipidemia    HPI  Patient is in today for follow up blood pressure and cholesterol.  Doing well on current treatment.  She is taking tramadol prn headaches --- 2x a month She is seeing neuro for MS   Patient Care Team: Zola Button, Grayling Congress, DO as PCP - General Janet Berlin, MD as Consulting Physician (Ophthalmology) Levie Heritage, DO as Consulting Physician (Gynecology) Butch Penny, NP as Registered Nurse (Neurology) Kalman Shan, MD as Consulting Physician (Pulmonary Disease) Lyn Records, MD as Consulting Physician (Cardiology)   Past Medical History:  Diagnosis Date  . AAA (abdominal aortic aneurysm) (HCC)   . Allergic rhinitis   . Chronic hoarseness   . Colon polyps   . COPD (chronic obstructive pulmonary disease) (HCC)    Dr. Marchelle Gearing  . Dyslipidemia   . Hypertension   . Hypothyroidism   . MS (multiple sclerosis) (HCC)   . Obesity   . Stomach ulcer from aspirin/ibuprofen-like drugs (NSAID's)     Past Surgical History:  Procedure Laterality Date  . ABDOMINAL HYSTERECTOMY    . BARIATRIC SURGERY  09/07/08   lap band  . CARPAL TUNNEL RELEASE    . CHOLECYSTECTOMY    . CHOLECYSTECTOMY    . HIATAL HERNIA REPAIR  09/07/08    Family History  Problem Relation Age of Onset  . COPD Father   . Heart disease Father   . AAA (abdominal aortic aneurysm) Father   . COPD Sister   . Arthritis Sister   . AAA (abdominal aortic aneurysm) Sister   . Dementia Sister   . Other Sister        brain injury  . Stroke Mother 1  . Heart disease Mother        congenital heart defect?    Social History   Socioeconomic History  . Marital status: Married   Spouse name: Casimiro Needle  . Number of children: 2  . Years of education: 33  . Highest education level: Not on file  Occupational History  . Occupation: retired  Engineer, production  . Financial resource strain: Not on file  . Food insecurity:    Worry: Not on file    Inability: Not on file  . Transportation needs:    Medical: Not on file    Non-medical: Not on file  Tobacco Use  . Smoking status: Former Smoker    Packs/day: 2.00    Years: 35.00    Pack years: 70.00    Types: Cigarettes    Last attempt to quit: 04/05/2007    Years since quitting: 10.4  . Smokeless tobacco: Never Used  Substance and Sexual Activity  . Alcohol use: No    Comment: very rare  . Drug use: No  . Sexual activity: Never    Partners: Male  Lifestyle  . Physical activity:    Days per week: Not on file    Minutes per session: Not on file  . Stress: Not on file  Relationships  . Social connections:    Talks on phone: Not on file    Gets together: Not on file  Attends religious service: Not on file    Active member of club or organization: Not on file    Attends meetings of clubs or organizations: Not on file    Relationship status: Not on file  . Intimate partner violence:    Fear of current or ex partner: Not on file    Emotionally abused: Not on file    Physically abused: Not on file    Forced sexual activity: Not on file  Other Topics Concern  . Not on file  Social History Narrative   Patient is right handed,reside in home with husband    Outpatient Medications Prior to Visit  Medication Sig Dispense Refill  . atorvastatin (LIPITOR) 20 MG tablet TAKE 1 TABLET BY MOUTH EVERY DAY 90 tablet 1  . Calcium Carbonate-Vitamin D (CALCIUM 600 + D PO) Take 1 tablet by mouth daily.     . cyclobenzaprine (FLEXERIL) 10 MG tablet Take 1 tablet (10 mg total) by mouth 3 (three) times daily as needed for muscle spasms. 30 tablet 0  . fluocinonide ointment (LIDEX) 0.05 % APPLY TO AFFECTED AREA TWICE A DAY UNTIL  NEXT VISIT  3  . FLUoxetine (PROZAC) 40 MG capsule TAKE 1 CAPSULE (40 MG TOTAL) BY MOUTH DAILY. 90 capsule 1  . gabapentin (NEURONTIN) 100 MG capsule 300 mg every evening.    Marland Kitchen HYDROcodone-acetaminophen (NORCO/VICODIN) 5-325 MG per tablet Take 1 tablet by mouth every 6 (six) hours as needed for moderate pain.    . Interferon Beta-1b (BETASERON) 0.3 MG KIT injection RECONSTITUTE AS DIRECTED AND INJECT 0.25MG  (= ) SUBCUTANEOUSLY ONCE EVERY OTHER DAY AS DIRECTED. 36 each 3  . levocetirizine (XYZAL) 5 MG tablet Take 1 tablet (5 mg total) by mouth at bedtime as needed for allergies. 90 tablet 1  . levothyroxine (SYNTHROID, LEVOTHROID) 125 MCG tablet Take 1 tablet (125 mcg total) by mouth daily. 90 tablet 1  . mometasone (NASONEX) 50 MCG/ACT nasal spray Place 2 sprays into the nose daily as needed (allergies). 17 g 5  . Multiple Vitamin (MULTIVITAMIN) tablet Take 1 tablet by mouth 2 (two) times daily.      Marland Kitchen losartan-hydrochlorothiazide (HYZAAR) 100-12.5 MG tablet Take 1 tablet by mouth daily. 90 tablet 1  . traMADol (ULTRAM) 50 MG tablet Take 1 tablet (50 mg total) by mouth every 6 (six) hours as needed. 30 tablet 1  . conjugated estrogens (PREMARIN) vaginal cream Place 1 Applicatorful vaginally daily. 42.5 g 12   No facility-administered medications prior to visit.     Allergies  Allergen Reactions  . Codeine   . Hydromorphone Hcl     REACTION: nausea/vomiting  . Ibuprofen     REACTION: stomach ulcers  . Morphine Sulfate   . Metronidazole Hives and Rash    Review of Systems  Constitutional: Negative for fever and malaise/fatigue.  HENT: Negative for congestion.   Eyes: Negative for blurred vision.  Respiratory: Negative for cough and shortness of breath.   Cardiovascular: Negative for chest pain, palpitations and leg swelling.  Gastrointestinal: Negative for vomiting.  Musculoskeletal: Negative for back pain.  Skin: Negative for rash.  Neurological: Negative for loss of  consciousness and headaches.       Objective:    Physical Exam  Constitutional: She is oriented to person, place, and time. She appears well-developed and well-nourished.  HENT:  Head: Normocephalic and atraumatic.  Eyes: Conjunctivae and EOM are normal.  Neck: Normal range of motion. Neck supple. No JVD present. Carotid bruit is not present.  No thyromegaly present.  Cardiovascular: Normal rate, regular rhythm and normal heart sounds.  No murmur heard. Pulmonary/Chest: Effort normal and breath sounds normal. No respiratory distress. She has no wheezes. She has no rales. She exhibits no tenderness.  Musculoskeletal: She exhibits no edema.  Neurological: She is alert and oriented to person, place, and time.  Psychiatric: She has a normal mood and affect.  Nursing note and vitals reviewed.   BP 130/70 (BP Location: Left Arm, Cuff Size: Large)   Pulse 62   Temp (!) 97.5 F (36.4 C) (Oral)   Resp 16   Ht 5\' 6"  (1.676 m)   Wt 251 lb 9.6 oz (114.1 kg)   SpO2 97%   BMI 40.61 kg/m  Wt Readings from Last 3 Encounters:  09/27/17 251 lb 9.6 oz (114.1 kg)  09/10/17 250 lb (113.4 kg)  03/29/17 252 lb (114.3 kg)   BP Readings from Last 3 Encounters:  09/27/17 130/70  09/10/17 117/65  03/29/17 118/80     Immunization History  Administered Date(s) Administered  . H1N1 07/20/2008  . Influenza Whole 02/26/2008, 03/04/2010, 02/27/2013  . Influenza, High Dose Seasonal PF 04/01/2015, 03/27/2016  . Pneumococcal Conjugate-13 04/01/2015  . Pneumococcal Polysaccharide-23 07/20/2008, 03/07/2011  . Tdap 03/07/2011  . Zoster 02/28/2010    Health Maintenance  Topic Date Due  . PNA vac Low Risk Adult (2 of 2 - PPSV23) 03/31/2016  . COLONOSCOPY  05/18/2017  . INFLUENZA VACCINE  01/03/2018  . MAMMOGRAM  05/31/2018  . TETANUS/TDAP  03/06/2021  . DEXA SCAN  Completed  . Hepatitis C Screening  Completed    Lab Results  Component Value Date   WBC 5.9 03/29/2017   HGB 14.0 03/29/2017    HCT 41.9 03/29/2017   PLT 130.0 (L) 03/29/2017   GLUCOSE 105 (H) 03/29/2017   CHOL 158 03/29/2017   TRIG 129.0 03/29/2017   HDL 54.80 03/29/2017   LDLCALC 77 03/29/2017   ALT 18 03/29/2017   AST 21 03/29/2017   NA 136 03/29/2017   K 4.5 03/29/2017   CL 100 03/29/2017   CREATININE 0.85 03/29/2017   BUN 10 03/29/2017   CO2 29 03/29/2017   TSH 2.78 03/29/2017   HGBA1C 5.9 09/25/2016   MICROALBUR 2.5 (H) 09/15/2014    Lab Results  Component Value Date   TSH 2.78 03/29/2017   Lab Results  Component Value Date   WBC 5.9 03/29/2017   HGB 14.0 03/29/2017   HCT 41.9 03/29/2017   MCV 91.4 03/29/2017   PLT 130.0 (L) 03/29/2017   Lab Results  Component Value Date   NA 136 03/29/2017   K 4.5 03/29/2017   CO2 29 03/29/2017   GLUCOSE 105 (H) 03/29/2017   BUN 10 03/29/2017   CREATININE 0.85 03/29/2017   BILITOT 0.5 03/29/2017   ALKPHOS 63 03/29/2017   AST 21 03/29/2017   ALT 18 03/29/2017   PROT 6.7 03/29/2017   ALBUMIN 3.7 03/29/2017   CALCIUM 9.4 03/29/2017   GFR 70.87 03/29/2017   Lab Results  Component Value Date   CHOL 158 03/29/2017   Lab Results  Component Value Date   HDL 54.80 03/29/2017   Lab Results  Component Value Date   LDLCALC 77 03/29/2017   Lab Results  Component Value Date   TRIG 129.0 03/29/2017   Lab Results  Component Value Date   CHOLHDL 3 03/29/2017   Lab Results  Component Value Date   HGBA1C 5.9 09/25/2016  Assessment & Plan:   Problem List Items Addressed This Visit      Unprioritized   HTN (hypertension) - Primary    Poorly controlled will alter medications, encouraged DASH diet, minimize caffeine and obtain adequate sleep. Report concerning symptoms and follow up as directed and as needed      Relevant Medications   telmisartan-hydrochlorothiazide (MICARDIS HCT) 40-12.5 MG tablet   Hyperlipidemia    Tolerating statin, encouraged heart healthy diet, avoid trans fats, minimize simple carbs and saturated fats.  Increase exercise as tolerated      Relevant Medications   telmisartan-hydrochlorothiazide (MICARDIS HCT) 40-12.5 MG tablet   Hyperlipidemia LDL goal <100   Relevant Medications   telmisartan-hydrochlorothiazide (MICARDIS HCT) 40-12.5 MG tablet   Hypothyroidism   Relevant Orders   TSH   Lichen sclerosus of female genitalia    Per [facility]      MULTIPLE SCLEROSIS, RELAPSING/REMITTING    Per htn      THROMBOCYTOPENIA    Other Visit Diagnoses    Headache disorder       Relevant Medications   traMADol (ULTRAM) 50 MG tablet   High risk medication use       Relevant Orders   Pain Mgmt, Profile 8 w/Conf, U   Pain Mgmt, Tramadol w/medMATCH, U      I have discontinued Tery Sanfilippo. Mino's conjugated estrogens and losartan-hydrochlorothiazide. I am also having her start on telmisartan-hydrochlorothiazide. Additionally, I am having her maintain her multivitamin, Calcium Carbonate-Vitamin D (CALCIUM 600 + D PO), HYDROcodone-acetaminophen, mometasone, Interferon Beta-1b, levothyroxine, FLUoxetine, levocetirizine, cyclobenzaprine, atorvastatin, gabapentin, fluocinonide ointment, and traMADol.  Meds ordered this encounter  Medications  . telmisartan-hydrochlorothiazide (MICARDIS HCT) 40-12.5 MG tablet    Sig: Take 1 tablet by mouth daily.    Dispense:  30 tablet    Refill:  2  . traMADol (ULTRAM) 50 MG tablet    Sig: Take 1 tablet (50 mg total) by mouth every 6 (six) hours as needed.    Dispense:  30 tablet    Refill:  1    CMA served as scribe during this visit. History, Physical and Plan performed by medical provider. Documentation and orders reviewed and attested to.  [name], DO

## 2017-09-27 NOTE — Patient Instructions (Addendum)
Follow up with PCP as directed  Continue to eat heart healthy diet (full of fruits, vegetables, whole grains, lean protein, water--limit salt, fat, and sugar intake) and increase physical activity as tolerated.  Continue doing brain stimulating activities (puzzles, reading, adult coloring books, staying active) to keep memory sharp.    Cassandra Rios , Thank you for taking time to come for your Medicare Wellness Visit. I appreciate your ongoing commitment to your health goals. Please review the following plan we discussed and let me know if I can assist you in the future.   These are the goals we discussed: Goals    . Take beach trip with best friends and maintain current health       This is a list of the screening recommended for you and due dates:  Health Maintenance  Topic Date Due  . Pneumonia vaccines (2 of 2 - PPSV23) 03/31/2016  . Colon Cancer Screening  05/18/2017  . Flu Shot  01/03/2018  . Mammogram  05/31/2018  . Tetanus Vaccine  03/06/2021  . DEXA scan (bone density measurement)  Completed  .  Hepatitis C: One time screening is recommended by Center for Disease Control  (CDC) for  adults born from [birth year] through 1966.   Completed

## 2017-09-27 NOTE — Assessment & Plan Note (Signed)
Per [facility]

## 2017-09-27 NOTE — Assessment & Plan Note (Signed)
Tolerating statin, encouraged heart healthy diet, avoid trans fats, minimize simple carbs and saturated fats. Increase exercise as tolerated 

## 2017-09-27 NOTE — Assessment & Plan Note (Signed)
Per htn

## 2017-09-27 NOTE — Patient Instructions (Signed)

## 2017-09-28 LAB — PAIN MGMT, PROFILE 8 W/CONF, U
6 Acetylmorphine: NEGATIVE ng/mL (ref <10)
AMPHETAMINES: NEGATIVE ng/mL (ref <500)
Alcohol Metabolites: NEGATIVE ng/mL (ref <500)
BENZODIAZEPINES: NEGATIVE ng/mL (ref <100)
Buprenorphine, Urine: NEGATIVE ng/mL (ref <5)
Cocaine Metabolite: NEGATIVE ng/mL (ref <150)
Creatinine: 77.3 mg/dL
MDMA: NEGATIVE ng/mL (ref <500)
Marijuana Metabolite: NEGATIVE ng/mL (ref <20)
OXIDANT: NEGATIVE ug/mL (ref <200)
OXYCODONE: NEGATIVE ng/mL (ref <100)
Opiates: NEGATIVE ng/mL (ref <100)
pH: 7.22 (ref 4.5–9.0)

## 2017-09-30 LAB — PAIN MGMT, TRAMADOL W/MEDMATCH, U
Desmethyltramadol: NEGATIVE ng/mL (ref <100)
Tramadol: 873 ng/mL — ABNORMAL HIGH (ref <100)

## 2017-10-01 ENCOUNTER — Telehealth: Payer: Self-pay | Admitting: *Deleted

## 2017-10-01 NOTE — Telephone Encounter (Signed)
-----   Message from Ann Held, DO sent at 09/27/2017  7:34 PM EDT ----- Have pt decrease synthroid to 112 mcg #30 2 refills Recheck 2 months TSH

## 2017-10-02 NOTE — Telephone Encounter (Signed)
Was this after you looked at her results?  Received this message as a staff message.

## 2017-10-03 NOTE — Telephone Encounter (Signed)
Patient not aware of any changes for her thyroid.  Results given for lab results.

## 2017-10-03 NOTE — Telephone Encounter (Signed)
I thought the pt called about dose but I don't see message any more

## 2017-10-05 DIAGNOSIS — M25512 Pain in left shoulder: Secondary | ICD-10-CM | POA: Diagnosis not present

## 2017-10-05 DIAGNOSIS — M7542 Impingement syndrome of left shoulder: Secondary | ICD-10-CM | POA: Diagnosis not present

## 2017-10-09 ENCOUNTER — Other Ambulatory Visit: Payer: Self-pay | Admitting: Family Medicine

## 2017-10-17 ENCOUNTER — Ambulatory Visit: Payer: PPO

## 2017-10-18 ENCOUNTER — Encounter: Payer: Self-pay | Admitting: Family Medicine

## 2017-10-18 ENCOUNTER — Ambulatory Visit (INDEPENDENT_AMBULATORY_CARE_PROVIDER_SITE_OTHER): Payer: PPO | Admitting: Family Medicine

## 2017-10-18 DIAGNOSIS — I10 Essential (primary) hypertension: Secondary | ICD-10-CM | POA: Diagnosis not present

## 2017-10-18 NOTE — Patient Instructions (Signed)
Continue same medications. Follow-up as already scheduled.

## 2017-10-18 NOTE — Progress Notes (Signed)
Pre visit review using our clinic review tool, if applicable. No additional management support is needed unless otherwise documented below in the visit note.  Pt here today for BP check per Dr. Etter Sjogren.   BP in R arm: 117/84 Pulse: 81  Pt instructed to continue same medications. Follow-up as already scheduled.

## 2017-10-26 DIAGNOSIS — R208 Other disturbances of skin sensation: Secondary | ICD-10-CM | POA: Diagnosis not present

## 2017-10-26 DIAGNOSIS — L900 Lichen sclerosus et atrophicus: Secondary | ICD-10-CM | POA: Diagnosis not present

## 2017-10-26 DIAGNOSIS — L292 Pruritus vulvae: Secondary | ICD-10-CM | POA: Diagnosis not present

## 2017-10-26 DIAGNOSIS — Z79899 Other long term (current) drug therapy: Secondary | ICD-10-CM | POA: Diagnosis not present

## 2017-10-28 ENCOUNTER — Other Ambulatory Visit: Payer: Self-pay | Admitting: Family Medicine

## 2017-10-30 NOTE — Telephone Encounter (Signed)
Received refill request for atorvastatin (LIPITOR) 20 MG tablet. Last office visit 10/18/17, last refill 07/27/17, next appt with Dr. Etter Sjogren 04/19/18. Refill sent to pt's pharmacy.

## 2017-11-05 DIAGNOSIS — M7542 Impingement syndrome of left shoulder: Secondary | ICD-10-CM | POA: Diagnosis not present

## 2017-11-05 DIAGNOSIS — M19012 Primary osteoarthritis, left shoulder: Secondary | ICD-10-CM | POA: Diagnosis not present

## 2017-11-05 DIAGNOSIS — M19019 Primary osteoarthritis, unspecified shoulder: Secondary | ICD-10-CM | POA: Insufficient documentation

## 2017-11-08 DIAGNOSIS — H2511 Age-related nuclear cataract, right eye: Secondary | ICD-10-CM | POA: Diagnosis not present

## 2017-11-08 DIAGNOSIS — H25811 Combined forms of age-related cataract, right eye: Secondary | ICD-10-CM | POA: Diagnosis not present

## 2017-11-08 DIAGNOSIS — H52221 Regular astigmatism, right eye: Secondary | ICD-10-CM | POA: Diagnosis not present

## 2017-11-08 DIAGNOSIS — H25011 Cortical age-related cataract, right eye: Secondary | ICD-10-CM | POA: Diagnosis not present

## 2017-11-08 HISTORY — PX: CATARACT EXTRACTION: SUR2

## 2017-11-22 DIAGNOSIS — H25812 Combined forms of age-related cataract, left eye: Secondary | ICD-10-CM | POA: Diagnosis not present

## 2017-11-22 DIAGNOSIS — H25042 Posterior subcapsular polar age-related cataract, left eye: Secondary | ICD-10-CM | POA: Diagnosis not present

## 2017-11-22 DIAGNOSIS — H52222 Regular astigmatism, left eye: Secondary | ICD-10-CM | POA: Diagnosis not present

## 2017-11-22 DIAGNOSIS — H2512 Age-related nuclear cataract, left eye: Secondary | ICD-10-CM | POA: Diagnosis not present

## 2017-11-22 DIAGNOSIS — H25012 Cortical age-related cataract, left eye: Secondary | ICD-10-CM | POA: Diagnosis not present

## 2017-11-22 HISTORY — PX: CATARACT EXTRACTION: SUR2

## 2017-11-24 ENCOUNTER — Other Ambulatory Visit: Payer: Self-pay | Admitting: Family Medicine

## 2017-11-24 DIAGNOSIS — E039 Hypothyroidism, unspecified: Secondary | ICD-10-CM

## 2017-11-30 ENCOUNTER — Telehealth: Payer: Self-pay | Admitting: Adult Health

## 2017-11-30 DIAGNOSIS — G35 Multiple sclerosis: Secondary | ICD-10-CM

## 2017-11-30 MED ORDER — INTERFERON BETA-1B 0.3 MG ~~LOC~~ KIT: PACK | SUBCUTANEOUS | 3 refills | Status: DC

## 2017-11-30 NOTE — Telephone Encounter (Signed)
Spoke to [employer], Software engineer at [phone] [facility] for [place] pt assistance.   Needed new prescription.  90 day supply 42 # with 3 refills.  Gave verbal order and updated prescription.

## 2017-11-30 NOTE — Addendum Note (Signed)
Addended by: Brandon Melnick on: 11/30/2017 12:23 PM   Modules accepted: Orders

## 2017-11-30 NOTE — Telephone Encounter (Signed)
[email or web address] [phone] for verbal orders go with option 4 Fax [phone] She has called requesting a refill for pt.  Margaretha Sheffield stated this is needed for pt by 3rd of July so she does not run out.  Please call

## 2017-12-24 ENCOUNTER — Other Ambulatory Visit: Payer: Self-pay | Admitting: Family Medicine

## 2017-12-24 DIAGNOSIS — I10 Essential (primary) hypertension: Secondary | ICD-10-CM

## 2017-12-29 ENCOUNTER — Observation Stay (HOSPITAL_COMMUNITY)
Admission: EM | Admit: 2017-12-29 | Discharge: 2017-12-31 | Disposition: A | Discharge status: Skilled Nursing Facility | Payer: PPO | Attending: Internal Medicine | Admitting: Internal Medicine

## 2017-12-29 ENCOUNTER — Emergency Department (HOSPITAL_COMMUNITY): Payer: PPO

## 2017-12-29 ENCOUNTER — Other Ambulatory Visit: Payer: Self-pay

## 2017-12-29 ENCOUNTER — Encounter (HOSPITAL_COMMUNITY): Payer: Self-pay

## 2017-12-29 DIAGNOSIS — Y9301 Activity, walking, marching and hiking: Secondary | ICD-10-CM | POA: Insufficient documentation

## 2017-12-29 DIAGNOSIS — Z9884 Bariatric surgery status: Secondary | ICD-10-CM | POA: Diagnosis not present

## 2017-12-29 DIAGNOSIS — I714 Abdominal aortic aneurysm, without rupture: Secondary | ICD-10-CM | POA: Diagnosis not present

## 2017-12-29 DIAGNOSIS — S82831A Other fracture of upper and lower end of right fibula, initial encounter for closed fracture: Secondary | ICD-10-CM | POA: Diagnosis present

## 2017-12-29 DIAGNOSIS — Z886 Allergy status to analgesic agent status: Secondary | ICD-10-CM | POA: Diagnosis not present

## 2017-12-29 DIAGNOSIS — Z79890 Hormone replacement therapy (postmenopausal): Secondary | ICD-10-CM | POA: Diagnosis not present

## 2017-12-29 DIAGNOSIS — R2681 Unsteadiness on feet: Secondary | ICD-10-CM | POA: Diagnosis not present

## 2017-12-29 DIAGNOSIS — Z86010 Personal history of colonic polyps: Secondary | ICD-10-CM | POA: Insufficient documentation

## 2017-12-29 DIAGNOSIS — E785 Hyperlipidemia, unspecified: Secondary | ICD-10-CM | POA: Insufficient documentation

## 2017-12-29 DIAGNOSIS — Z87891 Personal history of nicotine dependence: Secondary | ICD-10-CM | POA: Diagnosis not present

## 2017-12-29 DIAGNOSIS — M11262 Other chondrocalcinosis, left knee: Secondary | ICD-10-CM | POA: Diagnosis not present

## 2017-12-29 DIAGNOSIS — E039 Hypothyroidism, unspecified: Secondary | ICD-10-CM | POA: Insufficient documentation

## 2017-12-29 DIAGNOSIS — Z8249 Family history of ischemic heart disease and other diseases of the circulatory system: Secondary | ICD-10-CM | POA: Diagnosis not present

## 2017-12-29 DIAGNOSIS — R262 Difficulty in walking, not elsewhere classified: Secondary | ICD-10-CM | POA: Insufficient documentation

## 2017-12-29 DIAGNOSIS — Z885 Allergy status to narcotic agent status: Secondary | ICD-10-CM | POA: Diagnosis not present

## 2017-12-29 DIAGNOSIS — S82891A Other fracture of right lower leg, initial encounter for closed fracture: Secondary | ICD-10-CM | POA: Diagnosis not present

## 2017-12-29 DIAGNOSIS — Z79899 Other long term (current) drug therapy: Secondary | ICD-10-CM | POA: Insufficient documentation

## 2017-12-29 DIAGNOSIS — I10 Essential (primary) hypertension: Secondary | ICD-10-CM | POA: Diagnosis not present

## 2017-12-29 DIAGNOSIS — S8265XD Nondisplaced fracture of lateral malleolus of left fibula, subsequent encounter for closed fracture with routine healing: Secondary | ICD-10-CM | POA: Diagnosis not present

## 2017-12-29 DIAGNOSIS — G35 Multiple sclerosis: Secondary | ICD-10-CM | POA: Diagnosis not present

## 2017-12-29 DIAGNOSIS — W109XXA Fall (on) (from) unspecified stairs and steps, initial encounter: Secondary | ICD-10-CM | POA: Insufficient documentation

## 2017-12-29 DIAGNOSIS — Z6841 Body Mass Index (BMI) 40.0 and over, adult: Secondary | ICD-10-CM | POA: Insufficient documentation

## 2017-12-29 DIAGNOSIS — S82899A Other fracture of unspecified lower leg, initial encounter for closed fracture: Secondary | ICD-10-CM | POA: Diagnosis present

## 2017-12-29 DIAGNOSIS — R52 Pain, unspecified: Secondary | ICD-10-CM | POA: Diagnosis not present

## 2017-12-29 DIAGNOSIS — Z881 Allergy status to other antibiotic agents status: Secondary | ICD-10-CM | POA: Insufficient documentation

## 2017-12-29 DIAGNOSIS — S8392XA Sprain of unspecified site of left knee, initial encounter: Secondary | ICD-10-CM | POA: Insufficient documentation

## 2017-12-29 DIAGNOSIS — J449 Chronic obstructive pulmonary disease, unspecified: Secondary | ICD-10-CM | POA: Insufficient documentation

## 2017-12-29 DIAGNOSIS — D692 Other nonthrombocytopenic purpura: Secondary | ICD-10-CM | POA: Diagnosis not present

## 2017-12-29 DIAGNOSIS — D696 Thrombocytopenia, unspecified: Secondary | ICD-10-CM | POA: Diagnosis not present

## 2017-12-29 DIAGNOSIS — M25562 Pain in left knee: Secondary | ICD-10-CM | POA: Diagnosis not present

## 2017-12-29 DIAGNOSIS — S8992XA Unspecified injury of left lower leg, initial encounter: Secondary | ICD-10-CM | POA: Diagnosis not present

## 2017-12-29 LAB — BASIC METABOLIC PANEL
ANION GAP: 8 (ref 5–15)
BUN: 15 mg/dL (ref 8–23)
CHLORIDE: 104 mmol/L (ref 98–111)
CO2: 27 mmol/L (ref 22–32)
Calcium: 9.6 mg/dL (ref 8.9–10.3)
Creatinine, Ser: 0.95 mg/dL (ref 0.44–1.00)
GFR calc Af Amer: >60 mL/min (ref >60)
Glucose, Bld: 109 mg/dL — ABNORMAL HIGH (ref 70–99)
Potassium: 4 mmol/L (ref 3.5–5.1)
SODIUM: 139 mmol/L (ref 135–145)

## 2017-12-29 LAB — CBC WITH DIFFERENTIAL/PLATELET
BASOS ABS: 0 10*3/uL (ref 0.0–0.1)
Basophils Relative: 0 %
EOS ABS: 0.1 10*3/uL (ref 0.0–0.7)
EOS PCT: 1 %
HCT: 42.9 % (ref 36.0–46.0)
Hemoglobin: 14.3 g/dL (ref 12.0–15.0)
LYMPHS PCT: 19 %
Lymphs Abs: 1.5 10*3/uL (ref 0.7–4.0)
MCH: 30.3 pg (ref 26.0–34.0)
MCHC: 33.3 g/dL (ref 30.0–36.0)
MCV: 90.9 fL (ref 78.0–100.0)
MONO ABS: 0.6 10*3/uL (ref 0.1–1.0)
Monocytes Relative: 7 %
Neutro Abs: 5.8 10*3/uL (ref 1.7–7.7)
Neutrophils Relative %: 73 %
PLATELETS: 154 10*3/uL (ref 150–400)
RBC: 4.72 MIL/uL (ref 3.87–5.11)
RDW: 13 % (ref 11.5–15.5)
WBC: 7.9 10*3/uL (ref 4.0–10.5)

## 2017-12-29 MED ORDER — TRAMADOL HCL 50 MG PO TABS
50.0000 mg | ORAL_TABLET | Freq: Once | ORAL | Status: AC
  Administered 2017-12-29: 50 mg via ORAL
  Filled 2017-12-29: qty 1

## 2017-12-29 MED ORDER — OXYCODONE-ACETAMINOPHEN 5-325 MG PO TABS
2.0000 | ORAL_TABLET | Freq: Once | ORAL | Status: AC
  Administered 2017-12-29: 2 via ORAL
  Filled 2017-12-29: qty 2

## 2017-12-29 MED ORDER — SODIUM CHLORIDE 0.9 % IV BOLUS: 500.0000 mL | Freq: Once | INTRAVENOUS | Status: DC

## 2017-12-29 MED ORDER — SODIUM CHLORIDE 0.9 % IV SOLN: 2.0000 g | Freq: Once | INTRAVENOUS | Status: DC

## 2017-12-29 MED ORDER — TRAMADOL HCL 50 MG PO TABS: 50.0000 mg | ORAL_TABLET | Freq: Four times a day (QID) | ORAL | 0 refills | Status: DC | PRN

## 2017-12-29 MED ORDER — SODIUM CHLORIDE 0.9 % IV SOLN: INTRAVENOUS | Status: DC

## 2017-12-29 MED ORDER — TRAMADOL HCL 50 MG PO TABS: 50.0000 mg | ORAL_TABLET | Freq: Four times a day (QID) | ORAL | 0 refills | Status: AC | PRN

## 2017-12-29 NOTE — ED Provider Notes (Addendum)
[place] DEPT Provider Note   CSN: [id number] Arrival date & time: 12/29/17  1228     History   Chief Complaint Chief Complaint  Patient presents with  . Right Ankle Pain    HPI Cassandra Rios is a 67 y.o. female presenting via EMS for right ankle pain that began last night after a mechanical fall.  Patient states that she was walking when she missed a single step landing on her ankle and falling.  Patient denies hitting her head, loss of consciousness, vision changes or other injuries.  Patient states that she tried to manage the pain last night with ice packs however when she was unable to ambulate this morning she decided to call EMS. Patient states that the pain began suddenly after her fall describes her pain as a throbbing on the lateral aspect of her right ankle, worse with motion and ambulation.  Patient states that the pain occasionally radiates up to just below her knee.  Patient denies chest pain, shortness of breath, headache, neck pain, back pain, dysuria, nausea/vomiting.  HPI  Past Medical History:  Diagnosis Date  . AAA (abdominal aortic aneurysm) ([place])   . Allergic rhinitis   . Chronic hoarseness   . Colon polyps   . COPD (chronic obstructive pulmonary disease) ([facility])    Dr. Chase Caller  . Dyslipidemia   . Hypertension   . Hypothyroidism   . MS (multiple sclerosis) ([place])   . Obesity   . Stomach ulcer from aspirin/ibuprofen-like drugs (NSAID's)     Patient Active Problem List   Diagnosis Date Noted  . Hyperlipidemia LDL goal <100 09/27/2017  . Senile purpura ([place]) 03/29/2017  . Lichen sclerosus of female genitalia 10/23/2016  . Breast pain, left 03/27/2016  . Chest pain 03/27/2016  . HTN (hypertension) 09/24/2015  . Sinusitis, acute 03/23/2015  . Depression 07/06/2014  . Thoracic back pain 07/06/2014  . UTI (urinary tract infection) 04/28/2014  . Cellulitis and abscess of trunk 02/16/2014  . Severe obesity (BMI >=  40) ([place]) 06/30/2013  . Breast mass, right 01/10/2013  . Acute sinusitis 03/22/2009  . MULTIPLE SCLEROSIS, RELAPSING/REMITTING 01/04/2009  . THROMBOCYTOPENIA 09/15/2008  . SHOULDER PAIN, LEFT 09/15/2008  . BARIATRIC SURGERY STATUS 09/15/2008  . Hypothyroidism 07/20/2008  . HIATAL HERNIA 07/20/2008  . COPD with acute bronchitis ([place]) 07/23/2007  . ABNORMAL PULMONARY TEST RESULTS 07/23/2007  . Allergic rhinitis 06/26/2007  . HOARSENESS, CHRONIC 06/26/2007  . WHEEZING 06/26/2007  . SNORING 06/26/2007  . COUGH 06/26/2007  . TOBACCO ABUSE-HISTORY OF 06/26/2007  . COLONIC POLYPS, HYPERPLASTIC 06/25/2007  . Hyperlipidemia 06/25/2007  . MORBID OBESITY 06/25/2007  . Depression with anxiety 06/25/2007  . GASTRIC ULCER 06/25/2007  . COLONIC POLYPS, ADENOMATOUS, HX OF 06/25/2007    Past Surgical History:  Procedure Laterality Date  . ABDOMINAL HYSTERECTOMY    . BARIATRIC SURGERY  09/07/08   lap band  . CARPAL TUNNEL RELEASE    . CHOLECYSTECTOMY    . CHOLECYSTECTOMY    . HIATAL HERNIA REPAIR  09/07/08     OB History    Gravida  3   Para  2   Term  2   Preterm      AB      Living        SAB      TAB      Ectopic      Multiple      Live Births  2  Home Medications    Prior to Admission medications   Medication Sig Start Date End Date Taking? Authorizing Provider  atorvastatin (LIPITOR) 20 MG tablet TAKE 1 TABLET BY MOUTH EVERY DAY 10/30/17   Carollee Herter, Alferd Apa, DO  Calcium Carbonate-Vitamin D (CALCIUM 600 + D PO) Take 1 tablet by mouth daily.     [provider]  cyclobenzaprine (FLEXERIL) 10 MG tablet Take 1 tablet (10 mg total) by mouth 3 (three) times daily as needed for muscle spasms. 03/29/17   Roma Schanz R, DO  fluocinonide ointment (LIDEX) 0.05 % APPLY TO AFFECTED AREA TWICE A DAY UNTIL NEXT VISIT 08/06/17   [provider]  FLUoxetine (PROZAC) 40 MG capsule TAKE 1 CAPSULE BY MOUTH EVERY DAY 10/09/17   Carollee Herter,  Alferd Apa, DO  gabapentin (NEURONTIN) 100 MG capsule 300 mg every evening. 08/24/17   [provider]  HYDROcodone-acetaminophen (NORCO/VICODIN) 5-325 MG per tablet Take 1 tablet by mouth every 6 (six) hours as needed for moderate pain.    [provider]  Interferon Beta-1b (BETASERON) 0.3 MG KIT injection RECONSTITUTE AS DIRECTED AND INJECT 0.25MG (= 1ML) SUBCUTANEOUSLY ONCE EVERY OTHER DAY AS DIRECTED. 11/30/17   Ward Givens, NP  levocetirizine (XYZAL) 5 MG tablet Take 1 tablet (5 mg total) by mouth at bedtime as needed for allergies. 03/29/17   Ann Held, DO  levothyroxine (SYNTHROID, LEVOTHROID) 125 MCG tablet TAKE 1 TABLET BY MOUTH EVERY DAY 11/26/17   Carollee Herter, Alferd Apa, DO  losartan-hydrochlorothiazide (HYZAAR) 100-12.5 MG tablet TAKE 1 TABLET BY MOUTH EVERY DAY 10/09/17   Carollee Herter, Yvonne R, DO  mometasone (NASONEX) 50 MCG/ACT nasal spray Place 2 sprays into the nose daily as needed (allergies). 02/08/17   Ann Held, DO  Multiple Vitamin (MULTIVITAMIN) tablet Take 1 tablet by mouth 2 (two) times daily.      [provider]  telmisartan-hydrochlorothiazide (MICARDIS HCT) 40-12.5 MG tablet Take 1 tablet by mouth daily. 12/24/17   Ann Held, DO  traMADol (ULTRAM) 50 MG tablet Take 1 tablet (50 mg total) by mouth every 6 (six) hours as needed for up to 5 days. 12/29/17 01/03/18  Deliah Boston, PA-C    Family History Family History  Problem Relation Age of Onset  . COPD Father   . Heart disease Father   . AAA (abdominal aortic aneurysm) Father   . COPD Sister   . Arthritis Sister   . AAA (abdominal aortic aneurysm) Sister   . Dementia Sister   . Other Sister        brain injury  . Stroke Mother 35  . Heart disease Mother        congenital heart defect?    Social History Social History   Tobacco Use  . Smoking status: Former Smoker    Packs/day: 2.00    Years: 35.00    Pack years: 70.00    Types: Cigarettes     Last attempt to quit: 04/05/2007    Years since quitting: 10.7  . Smokeless tobacco: Never Used  Substance Use Topics  . Alcohol use: No    Comment: very rare  . Drug use: No     Allergies   Codeine; Hydromorphone hcl; Ibuprofen; Morphine sulfate; and Metronidazole   Review of Systems Review of Systems  Constitutional: Negative.  Negative for chills, fatigue and fever.  HENT: Negative.  Negative for sore throat and trouble swallowing.   Eyes: Negative.  Negative for  visual disturbance.  Respiratory: Negative.  Negative for cough and shortness of breath.   Cardiovascular: Negative.  Negative for chest pain.  Gastrointestinal: Negative.  Negative for abdominal pain, blood in stool, diarrhea, nausea and vomiting.  Genitourinary: Negative.  Negative for difficulty urinating, dysuria, hematuria and pelvic pain.  Musculoskeletal: Positive for arthralgias and joint swelling. Negative for back pain and neck pain.  Skin: Negative.  Negative for rash and wound.  Neurological: Negative.  Negative for dizziness, speech difficulty, weakness, light-headedness, numbness and headaches.     Physical Exam Updated Vital Signs BP (!) 174/74   Pulse (!) 59   Temp 97.7 F (36.5 C) (Oral)   Resp 13   Ht _0  (1.676 m)   Wt 113.4 kg (250 lb)   SpO2 100%   BMI 40.35 kg/m   Physical Exam  Constitutional: She is oriented to person, place, and time. She appears well-developed and well-nourished. No distress.  HENT:  Head: Normocephalic and atraumatic.  Right Ear: External ear normal.  Left Ear: External ear normal.  Nose: Nose normal.  Mouth/Throat: Oropharynx is clear and moist.  Eyes: Pupils are equal, round, and reactive to light. EOM are normal.  Neck: Normal range of motion. Neck supple. No JVD present. No tracheal deviation present.  Cardiovascular: Normal rate, regular rhythm, normal heart sounds and intact distal pulses.  Pulses:      Dorsalis pedis pulses are 3+ on the right side,  and 3+ on the left side.       Posterior tibial pulses are 3+ on the right side.  Pulmonary/Chest: Effort normal and breath sounds normal. No respiratory distress. She has no wheezes.  Abdominal: Soft. Bowel sounds are normal. There is no tenderness. There is no rebound and no guarding.  Musculoskeletal: She exhibits tenderness.       Right lower leg: Normal. She exhibits no tenderness, no swelling and no edema.       Left lower leg: Normal. She exhibits no tenderness, no swelling and no edema.       Feet:  Patient is neurovascularly intact to both feet bilaterally, equal pedal pulses, capillary refill intact, sensation intact.  She is able to wiggle her toes and flex and extend her right ankle with pain. There is no swelling into the calf.  All compartments are soft.  Feet:  Right Foot:  Protective Sensation: 3 sites tested. 3 sites sensed.  Left Foot:  Protective Sensation: 3 sites tested. 3 sites sensed.  Neurological: She is alert and oriented to person, place, and time.  Skin: Skin is warm and dry. Capillary refill takes less than 2 seconds.  Psychiatric: She has a normal mood and affect. Her behavior is normal.     ED Treatments / Results  Labs (all labs ordered are listed, but only abnormal results are displayed) Labs Reviewed  BASIC METABOLIC PANEL - Abnormal; Notable for the following components:      Result Value   Glucose, Bld 109 (*)    All other components within normal limits  CBC WITH DIFFERENTIAL/PLATELET    EKG None  Radiology Dg Ankle Complete Right  Result Date: 12/29/2017 CLINICAL DATA:  Recent fall down stairs with lateral ankle pain, initial encounter EXAM: RIGHT ANKLE - COMPLETE 3+ VIEW COMPARISON:  None. FINDINGS: Oblique fracture through the distal fibular metaphysis is noted. Only mild displacement is noted. No tibial fracture is seen. Calcaneal spurring is noted. No other focal abnormality is seen. IMPRESSION: Oblique fracture in the distal fibula.  Electronically Signed   By: Inez Catalina M.D.   On: 12/29/2017 13:44   Dg Foot Complete Right  Result Date: 12/29/2017 CLINICAL DATA:  Lateral ankle pain following fall down stairs, initial encounter EXAM: RIGHT FOOT COMPLETE - 3+ VIEW COMPARISON:  None. FINDINGS: Oblique fracture through the distal fibula is again identified and stable. No acute fracture or dislocation in the foot is noted. Calcaneal spurring is again seen. IMPRESSION: Distal fibular fracture.  No acute abnormality in the foot is seen. Electronically Signed   By: Inez Catalina M.D.   On: 12/29/2017 13:46    Procedures Procedures (including critical care time)  Medications Ordered in ED Medications  traMADol (ULTRAM) tablet 50 mg (50 mg Oral Given 12/29/17 1423)  traMADol (ULTRAM) tablet 50 mg (50 mg Oral Given 12/29/17 1522)     Initial Impression / Assessment and Plan / ED Course  I have reviewed the triage vital signs and the nursing notes.  Pertinent labs & imaging results that were available during my care of the patient were reviewed by me and considered in my medical decision making (see chart for details).  Clinical Course as of Dec 30 1710  Sat Dec 29, 2017  1505 Patient endorses some pain control after her first dose of pain medication, still rates her pain as 7/10 at this time.   [BM]  1625 Splint and boot applied by orthopedic technician.   [BM]    Clinical Course User Index [BM] Deliah Boston, PA-C   Patient presenting with right ankle pain after mechanical fall last night.  Patient denies loss of consciousness, head injury any other injury.  Imaging shows fracture to the distal fibula today.  Patient's pain controlled emergency department.  Patient is neurovascularly intact to her feet bilaterally, compartments are soft, pedal pulses intact, capillary refill intact, sensation intact.  Splint and boot applied by orthopedic technician in emergency department today. Patient states that Dr. Alma Friendly is  for orthopedic doctor and she will follow-up with him on Monday.  I have prescribed the patient tramadol for her pain, she has allergies to many other pain medications.  Patient states that she does have a few pills of tramadol prescribed by her PCP for her headaches that she uses as needed.  I have informed the patient not to take other narcotic medications or her other tramadol medication while she is taking the Tramadol I have prescribed today.  Patient states that she understands the dangers of opioid misuse and states that she will not use her other medication.  [place] PMP Aware utilized, shows patient prescribed 30 tramadol back in April.  I believe that today's prescription for her acute fracture is warranted.  Patient's vital signs are stable discharge, patients with history of hypertension, states she is not taking her medication today since she fell, states that she will take her medication when she gets home.  I have also encouraged the patient to follow-up with your primary care provider regarding her visit today.  At this time there does not appear to be any evidence of an acute emergency medical condition and the patient appears stable for discharge with appropriate outpatient follow up. Diagnosis was discussed with patient who verbalizes understanding and is agreeable to discharge. I have discussed return precautions with patient who verbalizes understanding of return precautions. Patient strongly encouraged to follow-up with their PCP.  Patient's case discussed with and patient seen by Dr. Eulis Foster who agrees with plan to discharge with follow-up.  This note was dictated using DragonOne dictation software; please contact for any inconsistencies within the note.   Final Clinical Impressions(s) / ED Diagnoses   Final diagnoses:  Closed fracture of distal end of right fibula, unspecified fracture morphology, initial encounter    ED Discharge Orders        Ordered    traMADol  (ULTRAM) 50 MG tablet  Every 6 hours PRN,   Status:  Discontinued     12/29/17 1651    traMADol (ULTRAM) 50 MG tablet  Every 6 hours PRN     12/29/17 1654       Deliah Boston, PA-C 12/29/17 1721    Deliah Boston, PA-C 12/29/17 1724    Daleen Bo, MD 12/29/17 [phone]

## 2017-12-29 NOTE — ED Notes (Addendum)
Patient transported to X-ray 

## 2017-12-29 NOTE — ED Notes (Signed)
Contacted 3E for walker then will attempted ambulate pt.

## 2017-12-29 NOTE — H&P (Signed)
Cassandra Rios:[id number] DOB: 02-13-1951 DOA: 12/29/2017     PCP: [name], DO   Outpatient Specialists:    NEurology    Dr. Anne Hahn  orthopedics Dr. Ranell Patrick Patient arrived to ER on 12/29/17 at 1228  Patient coming from:  home Lives   With family    Chief Complaint:  Chief Complaint  Patient presents with  . Right Ankle Pain    HPI: Cassandra Rios is a 67 y.o. female with medical history significant of HTN, MS     Presented with   Fall occurred yesterday mechanical fall as pt misstepped and missed a stair, no LOC no head injury she initialy could not get up but her grandson helped once she got this AM she no longer could walk main pain being in left knee also significant right ankle pain unable to ambulate or bear any weight she also have had left knee pain as well patient at baseline has trouble ambulating secondary to history of multiple sclerosis she lives with her husband who has multiple medical problems and unable to assist with ambulation. Patient presented to emergency department was put in Cami walker with plan to discharged home on tramadol but patient was unable to ambulate secondary to both legs buckling under her due to significant pain.  In the emergency department a walker was brought and patient attempted to ambulate again but was not able to tolerate.  States she has a walker at home but her pain is too severe to be able to bear any weight  Regarding pertinent Chronic problems: History of multiple sclerosis currently on   Betaseron so and have had some weakness with ambulation with frequently becoming off balance.    While in ER: Fibular fracture on the right   Following Medications were ordered in ER: Medications  traMADol (ULTRAM) tablet 50 mg (50 mg Oral Given 12/29/17 1423)  traMADol (ULTRAM) tablet 50 mg (50 mg Oral Given 12/29/17 1522)  oxyCODONE-acetaminophen (PERCOCET/ROXICET) 5-325 MG per tablet 2 tablet (2 tablets Oral Given  12/29/17 1934)    Significant initial  Findings: Abnormal Labs Reviewed  BASIC METABOLIC PANEL - Abnormal; Notable for the following components:      Result Value   Glucose, Bld 109 (*)    All other components within normal limits     Na 139 K 4.0  Cr  stable,   Lab Results  Component Value Date   CREATININE 0.95 12/29/2017   CREATININE 0.99 09/27/2017   CREATININE 0.85 03/29/2017      WBC  7.9  HG/HCT  stable,      Component Value Date/Time   HGB 14.3 12/29/2017 1338   HGB 14.3 09/05/2016 0944   HGB 14.3 07/07/2010 1530   HCT 42.9 12/29/2017 1338   HCT 42.2 09/05/2016 0944   HCT 42.1 07/07/2010 1530     UA not ordered      ED Triage Vitals  Enc Vitals Group     BP 12/29/17 1234 (!) 151/79     Pulse Rate 12/29/17 1234 73     Resp 12/29/17 1234 18     Temp 12/29/17 1234 97.7 F (36.5 C)     Temp Source 12/29/17 1234 Oral     SpO2 12/29/17 1234 100 %     Weight 12/29/17 1245 250 lb (113.4 kg)     Height 12/29/17 1245 5\' 6"  (1.676 m)     Head Circumference --      Peak  Flow --      Pain Score 12/29/17 1244 8     Pain Loc --      Pain Edu? --      Excl. in GC? --   TMAX(24)@       Latest  Blood pressure (!) 168/69, pulse 65, temperature 97.7 F (36.5 C), temperature source Oral, resp. rate 11, height 5\' 6"  (1.676 m), weight 113.4 kg (250 lb), SpO2 100 %.    Hospitalist was called for admission for right fibular fracture   Review of Systems:    Pertinent positives include: Frequent falls unstable gait fatigue,  Right ankle and left knee pain Constitutional:  No weight loss, night sweats, Fevers, chills,weight loss  HEENT:  No headaches, Difficulty swallowing,Tooth/dental problems,Sore throat,  No sneezing, itching, ear ache, nasal congestion, post nasal drip,  Cardio-vascular:  No chest pain, Orthopnea, PND, anasarca, dizziness, palpitations.no Bilateral lower extremity swelling  GI:  No heartburn, indigestion, abdominal pain, nausea,  vomiting, diarrhea, change in bowel habits, loss of appetite, melena, blood in stool, hematemesis Resp:  no shortness of breath at rest. No dyspnea on exertion, No excess mucus, no productive cough, No non-productive cough, No coughing up of blood.No change in color of mucus.No wheezing. Skin:  no rash or lesions. No jaundice GU:  no dysuria, change in color of urine, no urgency or frequency. No straining to urinate.  No flank pain.  Musculoskeletal:  No joint pain or no joint swelling. No decreased range of motion. No back pain.  Psych:  No change in mood or affect. No depression or anxiety. No memory loss.  Neuro:  no tingling, no weakness, no double vision,   no slurred speech, no confusion  All systems reviewed and apart from HOPI all are negative  Past Medical History:   Past Medical History:  Diagnosis Date  . AAA (abdominal aortic aneurysm) (HCC)   . Allergic rhinitis   . Chronic hoarseness   . Colon polyps   . COPD (chronic obstructive pulmonary disease) (HCC)    Dr. Marchelle Gearing  . Dyslipidemia   . Hypertension   . Hypothyroidism   . MS (multiple sclerosis) (HCC)   . Obesity   . Stomach ulcer from aspirin/ibuprofen-like drugs (NSAID's)       Past Surgical History:  Procedure Laterality Date  . ABDOMINAL HYSTERECTOMY    . BARIATRIC SURGERY  09/07/08   lap band  . CARPAL TUNNEL RELEASE    . CHOLECYSTECTOMY    . CHOLECYSTECTOMY    . HIATAL HERNIA REPAIR  09/07/08    Social History:  Ambulatory  independently        reports that she quit smoking about 10 years ago. Her smoking use included cigarettes. She has a 70.00 pack-year smoking history. She has never used smokeless tobacco. She reports that she does not drink alcohol or use drugs.     Family History:  Family History  Problem Relation Age of Onset  . COPD Father   . Heart disease Father   . AAA (abdominal aortic aneurysm) Father   . COPD Sister   . Arthritis Sister   . AAA (abdominal aortic  aneurysm) Sister   . Dementia Sister   . Other Sister        brain injury  . Stroke Mother 18  . Heart disease Mother        congenital heart defect?    Allergies: Allergies  Allergen Reactions  . Codeine   . Hydromorphone Hcl  REACTION: nausea/vomiting  . Ibuprofen     REACTION: stomach ulcers  . Morphine Sulfate   . Metronidazole Hives and Rash     Prior to Admission medications   Medication Sig Start Date End Date Taking? Authorizing Provider  atorvastatin (LIPITOR) 20 MG tablet TAKE 1 TABLET BY MOUTH EVERY DAY 10/30/17   Zola Button, Grayling Congress, DO  Calcium Carbonate-Vitamin D (CALCIUM 600 + D PO) Take 1 tablet by mouth daily.     [provider]  cyclobenzaprine (FLEXERIL) 10 MG tablet Take 1 tablet (10 mg total) by mouth 3 (three) times daily as needed for muscle spasms. 03/29/17   Seabron Spates R, DO  fluocinonide ointment (LIDEX) 0.05 % APPLY TO AFFECTED AREA TWICE A DAY UNTIL NEXT VISIT 08/06/17   [provider]  FLUoxetine (PROZAC) 40 MG capsule TAKE 1 CAPSULE BY MOUTH EVERY DAY 10/09/17   Zola Button, Grayling Congress, DO  gabapentin (NEURONTIN) 100 MG capsule 300 mg every evening. 08/24/17   [provider]  HYDROcodone-acetaminophen (NORCO/VICODIN) 5-325 MG per tablet Take 1 tablet by mouth every 6 (six) hours as needed for moderate pain.    [provider]  Interferon Beta-1b (BETASERON) 0.3 MG KIT injection RECONSTITUTE AS DIRECTED AND INJECT 0.25MG  (= ) SUBCUTANEOUSLY ONCE EVERY OTHER DAY AS DIRECTED. 11/30/17   Butch Penny, NP  levocetirizine (XYZAL) 5 MG tablet Take 1 tablet (5 mg total) by mouth at bedtime as needed for allergies. 03/29/17   [name], DO  levothyroxine (SYNTHROID, LEVOTHROID) 125 MCG tablet TAKE 1 TABLET BY MOUTH EVERY DAY 11/26/17   Zola Button, Grayling Congress, DO  losartan-hydrochlorothiazide (HYZAAR) 100-12.5 MG tablet TAKE 1 TABLET BY MOUTH EVERY DAY 10/09/17   Zola Button, Yvonne R, DO  mometasone  (NASONEX) 50 MCG/ACT nasal spray Place 2 sprays into the nose daily as needed (allergies). 02/08/17   [name], DO  Multiple Vitamin (MULTIVITAMIN) tablet Take 1 tablet by mouth 2 (two) times daily.      [provider]  telmisartan-hydrochlorothiazide (MICARDIS HCT) 40-12.5 MG tablet Take 1 tablet by mouth daily. 12/24/17   [name], DO  traMADol (ULTRAM) 50 MG tablet Take 1 tablet (50 mg total) by mouth every 6 (six) hours as needed for up to 5 days. 12/29/17 01/03/18  Bill Salinas, PA-C   Physical Exam: Blood pressure (!) 168/69, pulse 65, temperature 97.7 F (36.5 C), temperature source Oral, resp. rate 11, height 5\' 6"  (1.676 m), weight 113.4 kg (250 lb), SpO2 100 %. 1. General:  in No Acute distress  Chronically ill -appearing 2. Psychological: Alert and   Oriented 3. Head/ENT:    Dry Mucous Membranes                          Head Non traumatic, neck supple                           Poor Dentition 4. SKIN:   decreased Skin turgor,  Skin clean Dry and intact no rash 5. Heart: Regular rate and rhythm no Murmur, no Rub or gallop 6. Lungs: no wheezes or crackles   7. Abdomen: Soft,  non-tender, Non distended  Obese bowel sounds present 8. Lower extremities: no clubbing, cyanosis, or  edema 9. Neurologically Grossly intact, moving all 4 extremities equally  10. MSK: Normal range of motion   LABS:  Recent Labs  Lab 12/29/17 1338  WBC 7.9  NEUTROABS 5.8  HGB 14.3  HCT 42.9  MCV 90.9  PLT 154   Basic Metabolic Panel: Recent Labs  Lab 12/29/17 1338  NA 139  K 4.0  CL 104  CO2 27  GLUCOSE 109*  BUN 15  CREATININE 0.95  CALCIUM 9.6      No results for input(s): AST, ALT, ALKPHOS, BILITOT, PROT, ALBUMIN in the last 168 hours. No results for input(s): LIPASE, AMYLASE in the last 168 hours. No results for input(s): AMMONIA in the last 168 hours.    HbA1C: No results for input(s): HGBA1C in the last 72 hours. CBG: No results for  input(s): GLUCAP in the last 168 hours.    Urine analysis:    Component Value Date/Time   COLORURINE yellow 01/17/2010 0753   APPEARANCEUR Clear 01/17/2010 0753   LABSPEC <1.005 01/17/2010 0753   PHURINE 6.5 01/17/2010 0753   HGBUR negative 01/17/2010 0753   BILIRUBINUR neg 03/27/2016 1051   PROTEINUR neg 03/27/2016 1051   UROBILINOGEN 0.2 03/27/2016 1051   UROBILINOGEN 0.2 01/17/2010 0753   NITRITE neg 03/27/2016 1051   NITRITE negative 01/17/2010 0753   LEUKOCYTESUR Trace (A) 03/27/2016 1051       Cultures: No results found for: SDES, SPECREQUEST, CULT, REPTSTATUS   Radiological Exams on Admission: Dg Ankle Complete Right  Result Date: 12/29/2017 CLINICAL DATA:  Recent fall down stairs with lateral ankle pain, initial encounter EXAM: RIGHT ANKLE - COMPLETE 3+ VIEW COMPARISON:  None. FINDINGS: Oblique fracture through the distal fibular metaphysis is noted. Only mild displacement is noted. No tibial fracture is seen. Calcaneal spurring is noted. No other focal abnormality is seen. IMPRESSION: Oblique fracture in the distal fibula. Electronically Signed   By: Alcide Clever M.D.   On: 12/29/2017 13:44   Dg Knee Complete 4 Views Left  Result Date: 12/29/2017 CLINICAL DATA:  Acute LEFT knee pain following fall. Initial encounter. EXAM: LEFT KNEE - COMPLETE 4+ VIEW COMPARISON:  None. FINDINGS: No acute fracture, subluxation or dislocation identified. No joint effusion. Chondrocalcinosis noted. No suspicious focal bony lesions present. IMPRESSION: 1. No acute bony abnormality 2. Chondrocalcinosis Electronically Signed   By: Harmon Pier M.D.   On: 12/29/2017 20:11   Dg Foot Complete Right  Result Date: 12/29/2017 CLINICAL DATA:  Lateral ankle pain following fall down stairs, initial encounter EXAM: RIGHT FOOT COMPLETE - 3+ VIEW COMPARISON:  None. FINDINGS: Oblique fracture through the distal fibula is again identified and stable. No acute fracture or dislocation in the foot is noted.  Calcaneal spurring is again seen. IMPRESSION: Distal fibular fracture.  No acute abnormality in the foot is seen. Electronically Signed   By: Alcide Clever M.D.   On: 12/29/2017 13:46    Chart has been reviewed    Assessment/Plan   67 y.o. female with medical history significant of HTN, MS     Admitted for pain management for right ankle fracture after a fall  Present on Admission: . Ankle fracture /Closed fracture of right distal fibula- Will need to follow up with orthopedics next week, PT OT eval . HTN (hypertension) - continue home medications.  . Hyperlipidemia - stable cont statin . Hypothyroidism - stable check TSH cont synthroid . MORBID OBESITY - follow up with PCP . MULTIPLE SCLEROSIS, RELAPSING/REMITTING - follow up with Neurologist Hx of AAA - follow up with vascular surgery    Other plan as per orders.  DVT prophylaxis:  SCD  Code Status:  FULL CODE  as per patient  I had personally discussed CODE STATUS with patient   Family Communication:   Family not  at  Bedside    Disposition Plan:       To home once workup is complete and patient is stable                    Would benefit from PT/OT eval prior to DC  Ordered                                       Consults called: Orthopedics Please consult [facility] Dr. Ranell Patrick office in AM Admission status:     obs   Level of care      medical floor        Therisa Doyne 12/29/2017, 11:29 PM    [facility]  Pager [phone]   after 2 AM please page floor coverage PA If 7AM-7PM, please contact the day team taking care of the patient  [email or web address]  Password [email or web address]

## 2017-12-29 NOTE — ED Notes (Signed)
Pt stated pain was 1 out of 10 currently, however when Right foot is moved or repositioned pain jumps to 6 out of 10.

## 2017-12-29 NOTE — ED Notes (Signed)
PTAR has been canceled to due pt needing xray of knee.

## 2017-12-29 NOTE — ED Triage Notes (Signed)
Pt comes from home. Pt arrived via [facility]. Pt called 911 due to right ankle pain. Pt stated she fell last night but did not hit head however right ankle is painful and patient is unable to ambulate and or bearing any weight. Pt has attempted icing and elevating however patient has had no relief.   Pt did attempt to get up this morning, fell over and called 911. Pt has edema and bruising to right ankle Medial side. Pt is AOx4 and ambulatory at baseline. Pt has no other complaints.

## 2017-12-29 NOTE — ED Notes (Signed)
Bed: SW97 Expected date:  Expected time:  Means of arrival:  Comments: 67 yo ankle injury

## 2017-12-29 NOTE — Discharge Instructions (Addendum)
You may use the tramadol prescribed for pain. Do NOT use your other tramadol prescription that you have for your headaches or any other narcotic prescriptions while using the tramadol I have prescribed today.  Overuse of narcotics can lead to many problems including decrease in your breathing.r Please follow-up with your orthopedic doctor Dr. Veverly Fells as soon as possible for further evaluation. Please return to the emergency department for any new or worsening symptoms. Please follow-up with your primary care provider regarding your visit today as well.  Your blood pressure was elevated today, please be sure to take your blood pressure medications as prescribed by your primary care doctor when you return home and follow-up with your doctor as soon as possible.  Contact a health care provider if: Your cast or splint gets damaged. The skin around the cast gets red or raw. The skin under the cast is extremely itchy or painful. Your cast or splint feels very uncomfortable. Your cast or splint is too tight or too loose. Your cast becomes wet or it develops a soft spot or area. You get an object stuck under your cast. Get help right away if: Your pain is getting worse. The injured area tingles, becomes numb, or turns cold and blue. The part of your body above or below the cast is swollen and discolored. You cannot feel or move your fingers or toes. There is fluid leaking through the cast. You have severe pain or pressure under the cast. You have trouble breathing. You have shortness of breath. You have chest pain.

## 2017-12-29 NOTE — ED Notes (Signed)
ED TO INPATIENT HANDOFF REPORT  Name/Age/Gender Cassandra Rios 67 y.o. female  Code Status   Home/SNF/Other Home  Chief Complaint Ankle Injury  Level of Care/Admitting Diagnosis ED Disposition    ED Disposition Condition [facility] Area: [place] [[id number]]  Level of Care: Med-Surg [16]  Diagnosis: Ankle fracture [466599]  Admitting Physician: Toy Baker [[id number]]  Attending Physician: Toy Baker [[id number]]  PT Class (Do Not Modify): Observation [104]  PT Acc Code (Do Not Modify): Observation [10022]       Medical History Past Medical History:  Diagnosis Date  . AAA (abdominal aortic aneurysm) ([place])   . Allergic rhinitis   . Chronic hoarseness   . Colon polyps   . COPD (chronic obstructive pulmonary disease) (HCC)    Dr. Chase Caller  . Dyslipidemia   . Hypertension   . Hypothyroidism   . MS (multiple sclerosis) ([place])   . Obesity   . Stomach ulcer from aspirin/ibuprofen-like drugs (NSAID's)     Allergies Allergies  Allergen Reactions  . Codeine Itching and Nausea And Vomiting  . Hydromorphone Hcl     REACTION: nausea/vomiting  . Ibuprofen Other (See Comments)    REACTION: stomach ulcers  . Morphine Sulfate     "makes my blood pressure bottom out"  . Metronidazole Hives, Nausea And Vomiting and Rash    IV Location/Drains/Wounds Patient Lines/Drains/Airways Status   Active Line/Drains/Airways    None          Labs/Imaging Results for orders placed or performed during the hospital encounter of 12/29/17 (from the past 48 hour(s))  CBC with Differential     Status: None   Collection Time: 12/29/17  1:38 PM  Result Value Ref Range   WBC 7.9 4.0 - 10.5 K/uL   RBC 4.72 3.87 - 5.11 MIL/uL   Hemoglobin 14.3 12.0 - 15.0 g/dL   HCT 42.9 36.0 - 46.0 %   MCV 90.9 78.0 - 100.0 fL   MCH 30.3 26.0 - 34.0 pg   MCHC 33.3 30.0 - 36.0 g/dL   RDW 13.0 11.5 - 15.5 %   Platelets 154 150 - 400 K/uL   Neutrophils  Relative % 73 %   Neutro Abs 5.8 1.7 - 7.7 K/uL   Lymphocytes Relative 19 %   Lymphs Abs 1.5 0.7 - 4.0 K/uL   Monocytes Relative 7 %   Monocytes Absolute 0.6 0.1 - 1.0 K/uL   Eosinophils Relative 1 %   Eosinophils Absolute 0.1 0.0 - 0.7 K/uL   Basophils Relative 0 %   Basophils Absolute 0.0 0.0 - 0.1 K/uL    Comment: Performed at [facility], [place] [address] [place], [place] [address]  Basic metabolic panel     Status: Abnormal   Collection Time: 12/29/17  1:38 PM  Result Value Ref Range   Sodium 139 135 - 145 mmol/L   Potassium 4.0 3.5 - 5.1 mmol/L   Chloride 104 98 - 111 mmol/L   CO2 27 22 - 32 mmol/L   Glucose, Bld 109 (H) 70 - 99 mg/dL   BUN 15 8 - 23 mg/dL   Creatinine, Ser 0.95 0.44 - 1.00 mg/dL   Calcium 9.6 8.9 - 10.3 mg/dL   GFR calc non Af Amer >60 >60 mL/min   GFR calc Af Amer >60 >60 mL/min    Comment: (NOTE) The eGFR has been calculated using the CKD EPI equation. This calculation has not been validated in all clinical situations.  eGFR's persistently <60 mL/min signify possible Chronic Kidney Disease.    Anion gap 8 5 - 15    Comment: Performed at [facility], [place] [address], [place], [place] [address]   Dg Ankle Complete Right  Result Date: 12/29/2017 CLINICAL DATA:  Recent fall down stairs with lateral ankle pain, initial encounter EXAM: RIGHT ANKLE - COMPLETE 3+ VIEW COMPARISON:  None. FINDINGS: Oblique fracture through the distal fibular metaphysis is noted. Only mild displacement is noted. No tibial fracture is seen. Calcaneal spurring is noted. No other focal abnormality is seen. IMPRESSION: Oblique fracture in the distal fibula. Electronically Signed   By: Inez Catalina M.D.   On: 12/29/2017 13:44   Dg Knee Complete 4 Views Left  Result Date: 12/29/2017 CLINICAL DATA:  Acute LEFT knee pain following fall. Initial encounter. EXAM: LEFT KNEE - COMPLETE 4+ VIEW COMPARISON:  None. FINDINGS: No acute fracture, subluxation or  dislocation identified. No joint effusion. Chondrocalcinosis noted. No suspicious focal bony lesions present. IMPRESSION: 1. No acute bony abnormality 2. Chondrocalcinosis Electronically Signed   By: Margarette Canada M.D.   On: 12/29/2017 20:11   Dg Foot Complete Right  Result Date: 12/29/2017 CLINICAL DATA:  Lateral ankle pain following fall down stairs, initial encounter EXAM: RIGHT FOOT COMPLETE - 3+ VIEW COMPARISON:  None. FINDINGS: Oblique fracture through the distal fibula is again identified and stable. No acute fracture or dislocation in the foot is noted. Calcaneal spurring is again seen. IMPRESSION: Distal fibular fracture.  No acute abnormality in the foot is seen. Electronically Signed   By: Inez Catalina M.D.   On: 12/29/2017 13:46    Pending Labs [employer] (From admission, onward)   Start     Ordered   Signed and Held  HIV antibody (Routine Testing)  Tomorrow morning,   R     Signed and Held   Signed and Held  Magnesium  Tomorrow morning,   R    Comments:  Call MD if <1.5    Signed and Held   Signed and Held  Phosphorus  Tomorrow morning,   R     Signed and Held   Signed and Held  TSH  Once,   R    Comments:  Cancel if already done within 1 month and notify MD    Signed and Held   Signed and Held  Comprehensive metabolic panel  Once,   R    Comments:  Cal MD for K<3.5 or >5.0    Signed and Held   Signed and Held  CBC  Once,   R    Comments:  Call for hg <8.0    Signed and Held      Vitals/Pain Today's Vitals   12/29/17 1938 12/29/17 2030 12/29/17 2100 12/29/17 2330  BP:  (!) 164/79 (!) 168/69 140/66  Pulse:  60 65 66  Resp:  _0 Temp:      TempSrc:      SpO2:  100% 100% 100%  Weight:      Height:      PainSc: 10-Worst pain ever       Isolation Precautions No active isolations  Medications Medications  traMADol (ULTRAM) tablet 50 mg (50 mg Oral Given 12/29/17 1423)  traMADol (ULTRAM) tablet 50 mg (50 mg Oral Given 12/29/17 1522)   oxyCODONE-acetaminophen (PERCOCET/ROXICET) 5-325 MG per tablet 2 tablet (2 tablets Oral Given 12/29/17 1934)    Mobility walks with device and person assist

## 2017-12-29 NOTE — ED Provider Notes (Addendum)
Asked to see the patient as she is having difficulty ambulating with her hurt left knee and her right fibula fracture. Patient had already been discharged by prior care team please see their notes for workup and decision making to this point.  On my examination she has a walking boot in place but states that she has a fully bearing weight on her left knee because it hurts so bad.  They have given her Ultram which seemed to help initially but is not helping much now so we will try to increase her medications.  Also x-ray her left knee as I do not see that one was done.  Xray ok. Not able to control pain here. Has a lot of steps and husband is not able to help.   Discussed with [facility] who will see patient in AM.   Discussed with hospitalist who will admit for pain control and PT.       Cheryl Stabenow, Corene Cornea, MD 12/30/17 [phone]

## 2017-12-29 NOTE — ED Notes (Signed)
Attempted to ambulate pt a few steps with walker. Ortho tech present. Pt has walker at home.

## 2017-12-29 NOTE — ED Notes (Signed)
PTAR has been called. Pt can not get into vehicle due to both legs buckling.

## 2017-12-29 NOTE — ED Notes (Signed)
Bed: [facility]ALB Expected date:  Expected time:  Means of arrival:  Comments: 

## 2017-12-29 NOTE — ED Notes (Signed)
Pt is unable to get into POV. RN will call PTAR.

## 2017-12-29 NOTE — ED Provider Notes (Signed)
  Face-to-face evaluation   History: She presents for evaluation of fall injuring her left ankle region, yesterday.  Initially was able to walk but now cannot because of pain.  Physical exam: Obese alert cooperative.  Mild to moderate right lateral ankle tenderness or swelling.  Right ankle is grossly stable.  Right knee is nontender to palpation.  Medical screening examination/treatment/procedure(s) were conducted as a shared visit with non-physician practitioner(s) and myself.  I personally evaluated the patient during the encounter   Daleen Bo, MD 12/29/17 [phone]

## 2017-12-30 ENCOUNTER — Encounter (HOSPITAL_COMMUNITY): Payer: Self-pay

## 2017-12-30 ENCOUNTER — Other Ambulatory Visit: Payer: Self-pay

## 2017-12-30 DIAGNOSIS — S82831A Other fracture of upper and lower end of right fibula, initial encounter for closed fracture: Secondary | ICD-10-CM | POA: Diagnosis not present

## 2017-12-30 DIAGNOSIS — S82891A Other fracture of right lower leg, initial encounter for closed fracture: Secondary | ICD-10-CM | POA: Diagnosis not present

## 2017-12-30 DIAGNOSIS — S8392XA Sprain of unspecified site of left knee, initial encounter: Secondary | ICD-10-CM | POA: Diagnosis not present

## 2017-12-30 DIAGNOSIS — S8265XD Nondisplaced fracture of lateral malleolus of left fibula, subsequent encounter for closed fracture with routine healing: Secondary | ICD-10-CM | POA: Diagnosis not present

## 2017-12-30 LAB — COMPREHENSIVE METABOLIC PANEL
ALBUMIN: 3.2 g/dL — AB (ref 3.5–5.0)
ALK PHOS: 61 U/L (ref 38–126)
ALT: 16 U/L (ref 0–44)
ANION GAP: 7 (ref 5–15)
AST: 18 U/L (ref 15–41)
BUN: 14 mg/dL (ref 8–23)
CO2: 24 mmol/L (ref 22–32)
Calcium: 9.2 mg/dL (ref 8.9–10.3)
Chloride: 105 mmol/L (ref 98–111)
Creatinine, Ser: 0.88 mg/dL (ref 0.44–1.00)
GFR calc Af Amer: >60 mL/min (ref >60)
GFR calc non Af Amer: >60 mL/min (ref >60)
GLUCOSE: 101 mg/dL — AB (ref 70–99)
POTASSIUM: 4 mmol/L (ref 3.5–5.1)
SODIUM: 136 mmol/L (ref 135–145)
Total Bilirubin: 0.6 mg/dL (ref 0.3–1.2)
Total Protein: 6.2 g/dL — ABNORMAL LOW (ref 6.5–8.1)

## 2017-12-30 LAB — CBC
HEMATOCRIT: 38.1 % (ref 36.0–46.0)
HEMOGLOBIN: 12.6 g/dL (ref 12.0–15.0)
MCH: 30.1 pg (ref 26.0–34.0)
MCHC: 33.1 g/dL (ref 30.0–36.0)
MCV: 91.1 fL (ref 78.0–100.0)
Platelets: 145 10*3/uL — ABNORMAL LOW (ref 150–400)
RBC: 4.18 MIL/uL (ref 3.87–5.11)
RDW: 13.1 % (ref 11.5–15.5)
WBC: 6.7 10*3/uL (ref 4.0–10.5)

## 2017-12-30 LAB — TSH: TSH: 1.118 u[IU]/mL (ref 0.350–4.500)

## 2017-12-30 LAB — HIV ANTIBODY (ROUTINE TESTING W REFLEX): HIV SCREEN 4TH GENERATION: NONREACTIVE

## 2017-12-30 LAB — PHOSPHORUS: PHOSPHORUS: 4.2 mg/dL (ref 2.5–4.6)

## 2017-12-30 LAB — MAGNESIUM: MAGNESIUM: 2.1 mg/dL (ref 1.7–2.4)

## 2017-12-30 MED ORDER — INTERFERON BETA-1B 0.3 MG ~~LOC~~ KIT
0.2500 mg | PACK | SUBCUTANEOUS | Status: DC
  Administered 2017-12-30: 0.25 mg via SUBCUTANEOUS

## 2017-12-30 MED ORDER — ONDANSETRON HCL 4 MG PO TABS: 4.0000 mg | ORAL_TABLET | Freq: Four times a day (QID) | ORAL | Status: DC | PRN

## 2017-12-30 MED ORDER — ATORVASTATIN CALCIUM 20 MG PO TABS
20.0000 mg | ORAL_TABLET | Freq: Every day | ORAL | Status: DC
  Administered 2017-12-30: 20 mg via ORAL
  Filled 2017-12-30: qty 1

## 2017-12-30 MED ORDER — TRAMADOL HCL 50 MG PO TABS
100.0000 mg | ORAL_TABLET | Freq: Four times a day (QID) | ORAL | Status: DC | PRN
  Administered 2017-12-30 – 2017-12-31 (×4): 100 mg via ORAL
  Filled 2017-12-30 (×4): qty 2

## 2017-12-30 MED ORDER — GABAPENTIN 300 MG PO CAPS
300.0000 mg | ORAL_CAPSULE | Freq: Every day | ORAL | Status: DC
  Administered 2017-12-30 (×2): 300 mg via ORAL
  Filled 2017-12-30 (×2): qty 1

## 2017-12-30 MED ORDER — ENOXAPARIN SODIUM 40 MG/0.4ML ~~LOC~~ SOLN
40.0000 mg | SUBCUTANEOUS | Status: DC
  Administered 2017-12-30 – 2017-12-31 (×2): 40 mg via SUBCUTANEOUS
  Filled 2017-12-30 (×2): qty 0.4

## 2017-12-30 MED ORDER — TELMISARTAN-HCTZ 40-12.5 MG PO TABS: 1.0000 | ORAL_TABLET | Freq: Every day | ORAL | Status: DC

## 2017-12-30 MED ORDER — ACETAMINOPHEN 650 MG RE SUPP: 650.0000 mg | Freq: Four times a day (QID) | RECTAL | Status: DC | PRN

## 2017-12-30 MED ORDER — LEVOTHYROXINE SODIUM 125 MCG PO TABS
125.0000 ug | ORAL_TABLET | Freq: Every day | ORAL | Status: DC
  Administered 2017-12-30 – 2017-12-31 (×2): 125 ug via ORAL
  Filled 2017-12-30 (×2): qty 1

## 2017-12-30 MED ORDER — DOCUSATE SODIUM 100 MG PO CAPS
100.0000 mg | ORAL_CAPSULE | Freq: Two times a day (BID) | ORAL | Status: DC
  Administered 2017-12-30 – 2017-12-31 (×4): 100 mg via ORAL
  Filled 2017-12-30 (×4): qty 1

## 2017-12-30 MED ORDER — SENNA 8.6 MG PO TABS
1.0000 | ORAL_TABLET | Freq: Two times a day (BID) | ORAL | Status: DC
  Administered 2017-12-30: 8.6 mg via ORAL
  Filled 2017-12-30: qty 1

## 2017-12-30 MED ORDER — FLUOXETINE HCL 20 MG PO CAPS
40.0000 mg | ORAL_CAPSULE | Freq: Every day | ORAL | Status: DC
  Administered 2017-12-30 – 2017-12-31 (×2): 40 mg via ORAL
  Filled 2017-12-30 (×2): qty 2

## 2017-12-30 MED ORDER — HYDROCODONE-ACETAMINOPHEN 5-325 MG PO TABS
1.0000 | ORAL_TABLET | Freq: Four times a day (QID) | ORAL | Status: DC | PRN
Start: 2017-12-30 — End: 2017-12-31
  Administered 2017-12-30 (×2): 1 via ORAL
  Filled 2017-12-30 (×2): qty 1

## 2017-12-30 MED ORDER — ONDANSETRON HCL 4 MG/2ML IJ SOLN: 4.0000 mg | Freq: Four times a day (QID) | INTRAMUSCULAR | Status: DC | PRN

## 2017-12-30 MED ORDER — ACETAMINOPHEN 325 MG PO TABS
650.0000 mg | ORAL_TABLET | Freq: Four times a day (QID) | ORAL | Status: DC | PRN
  Administered 2017-12-30 – 2017-12-31 (×4): 650 mg via ORAL
  Filled 2017-12-30 (×4): qty 2

## 2017-12-30 NOTE — ED Notes (Signed)
RN on floor aware that pt does not have an IV. Rn said that was okay with her.

## 2017-12-30 NOTE — Consult Note (Addendum)
Reason for Consult:right ankle fracture, left knee pain Referring Physician: EDP  HPI: Cassandra Rios is an 67 y.o. female status post mechanical fall Friday evening, with continued complaints of primarily right ankle and left knee pain which has progressed to the point of significantly impacting her ability to mobilize.  She presented to the emergency room yesterday was unable to safely transfer mobilized and has subsequently been admitted for supportive care and physical therapy.  She does have a history of multiple sclerosis which has impacted her overall mobility.  She has previously been under the care of Dr. Veverly Fells in my practice, who has performed a left knee surgery in the past.  Past Medical History:  Diagnosis Date  . AAA (abdominal aortic aneurysm) ([place])   . Allergic rhinitis   . Chronic hoarseness   . Colon polyps   . COPD (chronic obstructive pulmonary disease) ([facility])    Dr. Chase Caller  . Dyslipidemia   . Hypertension   . Hypothyroidism   . MS (multiple sclerosis) ([place])   . Obesity   . Stomach ulcer from aspirin/ibuprofen-like drugs (NSAID's)     Past Surgical History:  Procedure Laterality Date  . ABDOMINAL HYSTERECTOMY    . BARIATRIC SURGERY  09/07/08   lap band  . CARPAL TUNNEL RELEASE    . CHOLECYSTECTOMY    . CHOLECYSTECTOMY    . HIATAL HERNIA REPAIR  09/07/08    Family History  Problem Relation Age of Onset  . COPD Father   . Heart disease Father   . AAA (abdominal aortic aneurysm) Father   . COPD Sister   . Arthritis Sister   . AAA (abdominal aortic aneurysm) Sister   . Dementia Sister   . Other Sister        brain injury  . Stroke Mother 87  . Heart disease Mother        congenital heart defect?    Social History:  reports that she quit smoking about 10 years ago. Her smoking use included cigarettes. She has a 70.00 pack-year smoking history. She has never used smokeless tobacco. She reports that she does not drink alcohol or use drugs.  Allergies:   Allergies  Allergen Reactions  . Codeine Itching and Nausea And Vomiting  . Hydromorphone Hcl     REACTION: nausea/vomiting  . Ibuprofen Other (See Comments)    REACTION: stomach ulcers  . Morphine Sulfate     "makes my blood pressure bottom out"  . Metronidazole Hives, Nausea And Vomiting and Rash    Medications: I have reviewed the patient's current medications.  Results for orders placed or performed during the hospital encounter of 12/29/17 (from the past 48 hour(s))  CBC with Differential     Status: None   Collection Time: 12/29/17  1:38 PM  Result Value Ref Range   WBC 7.9 4.0 - 10.5 K/uL   RBC 4.72 3.87 - 5.11 MIL/uL   Hemoglobin 14.3 12.0 - 15.0 g/dL   HCT 42.9 36.0 - 46.0 %   MCV 90.9 78.0 - 100.0 fL   MCH 30.3 26.0 - 34.0 pg   MCHC 33.3 30.0 - 36.0 g/dL   RDW 13.0 11.5 - 15.5 %   Platelets 154 150 - 400 K/uL   Neutrophils Relative % 73 %   Neutro Abs 5.8 1.7 - 7.7 K/uL   Lymphocytes Relative 19 %   Lymphs Abs 1.5 0.7 - 4.0 K/uL   Monocytes Relative 7 %   Monocytes Absolute 0.6 0.1 -  1.0 K/uL   Eosinophils Relative 1 %   Eosinophils Absolute 0.1 0.0 - 0.7 K/uL   Basophils Relative 0 %   Basophils Absolute 0.0 0.0 - 0.1 K/uL    Comment: Performed at [facility], [place] [address] [place], [place] [address]  Basic metabolic panel     Status: Abnormal   Collection Time: 12/29/17  1:38 PM  Result Value Ref Range   Sodium 139 135 - 145 mmol/L   Potassium 4.0 3.5 - 5.1 mmol/L   Chloride 104 98 - 111 mmol/L   CO2 27 22 - 32 mmol/L   Glucose, Bld 109 (H) 70 - 99 mg/dL   BUN 15 8 - 23 mg/dL   Creatinine, Ser 0.95 0.44 - 1.00 mg/dL   Calcium 9.6 8.9 - 10.3 mg/dL   GFR calc non Af Amer >60 >60 mL/min   GFR calc Af Amer >60 >60 mL/min    Comment: (NOTE) The eGFR has been calculated using the CKD EPI equation. This calculation has not been validated in all clinical situations. eGFR's persistently <60 mL/min signify possible Chronic  Kidney Disease.    Anion gap 8 5 - 15    Comment: Performed at [facility], [place] [address] [place], [place] [address]  Magnesium     Status: None   Collection Time: 12/30/17  4:36 AM  Result Value Ref Range   Magnesium 2.1 1.7 - 2.4 mg/dL    Comment: Performed at [facility], [place] [address] [place], [place] [address]  Phosphorus     Status: None   Collection Time: 12/30/17  4:36 AM  Result Value Ref Range   Phosphorus 4.2 2.5 - 4.6 mg/dL    Comment: Performed at [facility], [place] [address] [place], [place] [address]  TSH     Status: None   Collection Time: 12/30/17  4:36 AM  Result Value Ref Range   TSH 1.118 0.350 - 4.500 uIU/mL    Comment: Performed by a 3rd Generation assay with a functional sensitivity of <=0.01 uIU/mL. Performed at [facility], [place] [address] [place], [place] [address]   Comprehensive metabolic panel     Status: Abnormal   Collection Time: 12/30/17  4:36 AM  Result Value Ref Range   Sodium 136 135 - 145 mmol/L   Potassium 4.0 3.5 - 5.1 mmol/L   Chloride 105 98 - 111 mmol/L   CO2 24 22 - 32 mmol/L   Glucose, Bld 101 (H) 70 - 99 mg/dL   BUN 14 8 - 23 mg/dL   Creatinine, Ser 0.88 0.44 - 1.00 mg/dL   Calcium 9.2 8.9 - 10.3 mg/dL   Total Protein 6.2 (L) 6.5 - 8.1 g/dL   Albumin 3.2 (L) 3.5 - 5.0 g/dL   AST 18 15 - 41 U/L   ALT 16 0 - 44 U/L   Alkaline Phosphatase 61 38 - 126 U/L   Total Bilirubin 0.6 0.3 - 1.2 mg/dL   GFR calc non Af Amer >60 >60 mL/min   GFR calc Af Amer >60 >60 mL/min    Comment: (NOTE) The eGFR has been calculated using the CKD EPI equation. This calculation has not been validated in all clinical situations. eGFR's persistently <60 mL/min signify possible Chronic Kidney Disease.    Anion gap 7 5 - 15    Comment: Performed at [facility], [place] [address] [place], [place] [address]  CBC     Status:  Abnormal   Collection Time:  12/30/17  4:36 AM  Result Value Ref Range   WBC 6.7 4.0 - 10.5 K/uL   RBC 4.18 3.87 - 5.11 MIL/uL   Hemoglobin 12.6 12.0 - 15.0 g/dL   HCT 38.1 36.0 - 46.0 %   MCV 91.1 78.0 - 100.0 fL   MCH 30.1 26.0 - 34.0 pg   MCHC 33.1 30.0 - 36.0 g/dL   RDW 13.1 11.5 - 15.5 %   Platelets 145 (L) 150 - 400 K/uL    Comment: Performed at [facility], [place] [address] [place], [place] [address]    Dg Ankle Complete Right  Result Date: 12/29/2017 CLINICAL DATA:  Recent fall down stairs with lateral ankle pain, initial encounter EXAM: RIGHT ANKLE - COMPLETE 3+ VIEW COMPARISON:  None. FINDINGS: Oblique fracture through the distal fibular metaphysis is noted. Only mild displacement is noted. No tibial fracture is seen. Calcaneal spurring is noted. No other focal abnormality is seen. IMPRESSION: Oblique fracture in the distal fibula. Electronically Signed   By: Inez Catalina M.D.   On: 12/29/2017 13:44   Dg Knee Complete 4 Views Left  Result Date: 12/29/2017 CLINICAL DATA:  Acute LEFT knee pain following fall. Initial encounter. EXAM: LEFT KNEE - COMPLETE 4+ VIEW COMPARISON:  None. FINDINGS: No acute fracture, subluxation or dislocation identified. No joint effusion. Chondrocalcinosis noted. No suspicious focal bony lesions present. IMPRESSION: 1. No acute bony abnormality 2. Chondrocalcinosis Electronically Signed   By: Margarette Canada M.D.   On: 12/29/2017 20:11   Dg Foot Complete Right  Result Date: 12/29/2017 CLINICAL DATA:  Lateral ankle pain following fall down stairs, initial encounter EXAM: RIGHT FOOT COMPLETE - 3+ VIEW COMPARISON:  None. FINDINGS: Oblique fracture through the distal fibula is again identified and stable. No acute fracture or dislocation in the foot is noted. Calcaneal spurring is again seen. IMPRESSION: Distal fibular fracture.  No acute abnormality in the foot is seen. Electronically Signed   By: Inez Catalina M.D.   On: 12/29/2017 13:46     Vitals Temp:  [97.4 F  (36.3 C)-98.4 F (36.9 C)] 98.4 F (36.9 C) (07/28 0559) Pulse Rate:  [55-73] 57 (07/28 0559) Resp:  [10-18] 16 (07/28 0559) BP: (103-189)/(49-83) 103/49 (07/28 0559) SpO2:  [92 %-100 %] 97 % (07/28 0559) Weight:  [113.4 kg (250 lb)] 113.4 kg (250 lb) (07/27 1245) Body mass index is 40.35 kg/m.  Physical Exam: Patient is a pleasant and cooperative female, groggy having just awakened from sleep this morning.  She has complaints of left knee and left ankle pain and primarily right ankle pain.  Left knee shows no effusion.  No gross instability.  Painful arc of motion.  Left ankle shows some mild diffuse tenderness, particularly about the lateral malleolus.  However there is no swelling or ecchymosis.  Her arc of motion is limited secondary to pain.  Right ankle demonstrates global swelling with tenderness about the distal fibula.  She is nontender over the medial malleolus on the right.  Mild tenderness in the calf bilaterally but there is no palpable cords negative Homans sign.     Assessment/Plan: Impression:  1.  Nondisplaced right distal fibula fracture  2.  Left knee sprain with exacerbation of underlying chondrocalcinosis  3.  Left ankle sprain by clinical exam  4.  History of multiple sclerosis   Treatment: I have counseled Ms. Pop regarding today's findings.  Her right ankle fracture is a stable fracture pattern, and should do  well with conservative management.  She is currently in a cam boot.  We have discussed continued icing and elevation.  She may be weightbearing as tolerated with a walker.  I would recommend that we have her follow-up with Dr. Veverly Fells in a week to 10 days for reevaluation and repeat x-rays of the right ankle.  He may also follow-up regarding her left knee for which he has previously treated her.  Continue with ice and anti-inflammatories, mobilize with physical therapy.  Allecia Bells M Orestes Geiman 12/30/2017, 8:02 AM  Contact # [phone]

## 2017-12-30 NOTE — Evaluation (Signed)
Physical Therapy Evaluation Patient Details Name: Cassandra Rios MRN: [id number] DOB: Jul 16, 1950 Today's Date: 12/30/2017   History of Present Illness  68 year old with past medical history relevant for relapsing remitting multiple sclerosis, hypertension, hyperlipidemia, hypothyroidism, chronic pain who comes in after mechanical fall and found to have right acute distal  fibular fracture.  Patient was admitted due to difficulty with ambulation and poor home support;   Clinical Impression  Pt admitted with above diagnosis. Pt currently with functional limitations due to the deficits listed below (see PT Problem List).  Pt is independent at her baseline, she presents today with decr  Balance and limited activity tolerance placing her at risk for further falls; pt  will benefit from SNF, post acute; will follow in acute setting as well;   Pt will benefit from skilled PT to increase their independence and safety with mobility to allow discharge to the venue listed below.     Follow Up Recommendations SNF    Equipment Recommendations  None recommended by PT    Recommendations for Other Services       Precautions / Restrictions Precautions Precautions: Fall Required Braces or Orthoses: Other Brace/Splint Other Brace/Splint: camboot on RLE Restrictions Weight Bearing Restrictions: No Other Position/Activity Restrictions: WBAT RLE and LLE      Mobility  Bed Mobility Overal bed mobility: Needs Assistance Bed Mobility: Supine to Sit     Supine to sit: Min assist;Min guard     General bed mobility comments: assist with RLE, incr time  Transfers Overall transfer level: Needs assistance Equipment used: Rolling walker (2 wheeled) Transfers: Sit to/from Stand Sit to Stand: Min assist         General transfer comment: light assist to rise and transition to RW  Ambulation/Gait Ambulation/Gait assistance: Min assist   Assistive device: Rolling walker (2 wheeled) Gait  Pattern/deviations: Step-to pattern;Trunk flexed;Wide base of support;Decreased weight shift to right     General Gait Details: cues for sequence and RW position; assist for balance, unsteady but no overt LOB  Stairs            Wheelchair Mobility    Modified Rankin (Stroke Patients Only)       Balance Overall balance assessment: Needs assistance   Sitting balance-Leahy Scale: Fair Sitting balance - Comments: at least Fair     Standing balance-Leahy Scale: Poor Standing balance comment: reliant on UEs                              Pertinent Vitals/Pain Pain Assessment: Faces Faces Pain Scale: Hurts a little bit Pain Location: bil ankles Pain Descriptors / Indicators: Discomfort Pain Intervention(s): Premedicated before session;Monitored during session;Repositioned    Home Living Family/patient expects to be discharged to:: Private residence Living Arrangements: Spouse/significant other Available Help at Discharge: Family Type of Home: House Home Access: Stairs to enter   Secretary/administrator of Steps: 3 or 2 Home Layout: One level Home Equipment: Environmental consultant - 2 wheels;Cane - single point      Prior Function Level of Independence: Independent               Hand Dominance        Extremity/Trunk Assessment   Upper Extremity Assessment Upper Extremity Assessment: Overall WFL for tasks assessed    Lower Extremity Assessment Lower Extremity Assessment: RLE deficits/detail RLE Deficits / Details: knee and hip AROM WFL; strength at least 3/5 RLE: Unable to fully assess due  to immobilization       Communication   Communication: No difficulties  Cognition Arousal/Alertness: Awake/alert Behavior During Therapy: WFL for tasks assessed/performed Overall Cognitive Status: Within Functional Limits for tasks assessed                                        General Comments      Exercises     Assessment/Plan    PT  Assessment Patient needs continued PT services  PT Problem List Decreased strength;Decreased activity tolerance;Decreased balance;Decreased mobility;Pain;Decreased knowledge of use of DME;Obesity       PT Treatment Interventions DME instruction;Gait training;Functional mobility training;Therapeutic activities;Therapeutic exercise;Patient/family education;Balance training    PT Goals (Current goals can be found in the Care Plan section)  Acute Rehab PT Goals Patient Stated Goal: go to rehab and get stronger PT Goal Formulation: With patient Time For Goal Achievement: 01/06/18 Potential to Achieve Goals: Good    Frequency Min 3X/week   Barriers to discharge        Co-evaluation               AM-PAC PT "6 Clicks" Daily Activity  Outcome Measure Difficulty turning over in bed (including adjusting bedclothes, sheets and blankets)?: A Lot Difficulty moving from lying on back to sitting on the side of the bed? : A Lot Difficulty sitting down on and standing up from a chair with arms (e.g., wheelchair, bedside commode, etc,.)?: Unable Help needed moving to and from a bed to chair (including a wheelchair)?: A Little Help needed walking in hospital room?: A Little Help needed climbing 3-5 steps with a railing? : A Lot 6 Click Score: 13    End of Session Equipment Utilized During Treatment: Gait belt Activity Tolerance: Patient limited by fatigue Patient left: in chair;with chair alarm set;with call bell/phone within reach   PT Visit Diagnosis: Difficulty in walking, not elsewhere classified (R26.2);Unsteadiness on feet (R26.81);Pain Pain - Right/Left: Right Pain - part of body: Ankle and joints of foot    Time: 1212-1225 PT Time Calculation (min) (ACUTE ONLY): 13 min   Charges:   PT Evaluation $PT Eval Low Complexity: 1 Low          Drucilla Chalet, PT Pager: [phone] 12/30/2017   [facility] 12/30/2017, 1:12 PM

## 2017-12-30 NOTE — ED Notes (Signed)
Attempted to call 1W x4. After speaking with AC, hospital wing is not open. AC states "will call bed placement"

## 2017-12-30 NOTE — Progress Notes (Signed)
PROGRESS NOTE    Cassandra Rios  BJY:[id number] DOB: 24-Nov-1950 DOA: 12/29/2017 PCP: [name], DO    Brief Narrative:  67 year old with past medical history relevant for relapsing remitting multiple sclerosis on interferon beta, hypertension, hyperlipidemia, hypothyroidism, chronic pain who comes in after mechanical fall and found to have right acute fibular fracture.  Patient was admitted due to difficulty with ambulation and poor home support.   Assessment & Plan:   Active Problems:   Hypothyroidism   Hyperlipidemia   MORBID OBESITY   MULTIPLE SCLEROSIS, RELAPSING/REMITTING   HTN (hypertension)   Closed fracture of right distal fibula   Ankle fracture   #) Right fibular fracture distal: Per orthopedic surgery they feel that she should do well with conservative management. -Outpatient follow-up with orthopedic surgery -PRN hydrocodone, tramadol, acetaminophen for pain control -Physical therapy consult  #) Hypothyroidism: -Continue levothyroxine 125 mcg daily  #) Hyperlipidemia: -Continue atorvastatin 20 mg daily  #) Pain/psych: -Continue gabapentin 3 mg nightly -Continue fluoxetine 40 mill grams daily   #) Hypertension: -Continue telmisartan-HCTZ 40-12.5 mg daily  #) Relapsing remitting multiple sclerosis: -Continue interferon beta 0.25 mg every other day  Fluids: Tolerating p.o. Electrolytes: Monitor and supplement Nutrition: Heart healthy diet  Prophylaxis: Enoxaparin  Disposition: Pending PT evaluation and safe discharge  Full code   Consultants:   Orthopedic surgery  Procedures:  None  Antimicrobials:   None   Subjective: Patient reports that her pain is under much better control.  She denies any nausea, vomiting, diarrhea.  She denies any cough, congestion, rhinorrhea.  Objective: Vitals:   12/30/17 0100 12/30/17 0158 12/30/17 0559 12/30/17 1005  BP: 127/67 (!) 153/66 (!) 103/49 (!) 106/50  Pulse: 60 (!) 55 (!) 57 (!)  57  Resp: 11 15 16 17   Temp:  (!) 97.4 F (36.3 C) 98.4 F (36.9 C) (!) 97.5 F (36.4 C)  TempSrc:  Oral Oral Oral  SpO2: 100% 100% 97% 99%  Weight:      Height:        Intake/Output Summary (Last 24 hours) at 12/30/2017 1046 Last data filed at 12/30/2017 1006 Gross per 24 hour  Intake 480 ml  Output 300 ml  Net 180 ml   Filed Weights   12/29/17 1245  Weight: 113.4 kg (250 lb)    Examination:  General exam: Appears calm and comfortable  Respiratory system: Clear to auscultation. Respiratory effort normal. Cardiovascular system: Regular rate and rhythm, no murmurs Gastrointestinal system: Abdomen is nondistended, soft and nontender. No organomegaly or masses felt. Normal bowel sounds heard. Central nervous system: Alert and oriented. No focal neurological deficits. Extremities: Right lower extremity in Cam boot, intact distal pulses, 1+ bilateral lower extremity edema Skin: No rashes on visible skin Psychiatry: Judgement and insight appear normal. Mood & affect appropriate.     Data Reviewed: I have personally reviewed following labs and imaging studies  CBC: Recent Labs  Lab 12/29/17 1338 12/30/17 0436  WBC 7.9 6.7  NEUTROABS 5.8  --   HGB 14.3 12.6  HCT 42.9 38.1  MCV 90.9 91.1  PLT 154 145*   Basic Metabolic Panel: Recent Labs  Lab 12/29/17 1338 12/30/17 0436  NA 139 136  K 4.0 4.0  CL 104 105  CO2 27 24  GLUCOSE 109* 101*  BUN 15 14  CREATININE 0.95 0.88  CALCIUM 9.6 9.2  MG  --  2.1  PHOS  --  4.2   GFR: Estimated Creatinine Clearance: 79.2 mL/min (by C-G  formula based on SCr of 0.88 mg/dL). Liver Function Tests: Recent Labs  Lab 12/30/17 0436  AST 18  ALT 16  ALKPHOS 61  BILITOT 0.6  PROT 6.2*  ALBUMIN 3.2*   No results for input(s): LIPASE, AMYLASE in the last 168 hours. No results for input(s): AMMONIA in the last 168 hours. Coagulation Profile: No results for input(s): INR, PROTIME in the last 168 hours. Cardiac Enzymes: No  results for input(s): CKTOTAL, CKMB, CKMBINDEX, TROPONINI in the last 168 hours. BNP (last 3 results) No results for input(s): PROBNP in the last 8760 hours. HbA1C: No results for input(s): HGBA1C in the last 72 hours. CBG: No results for input(s): GLUCAP in the last 168 hours. Lipid Profile: No results for input(s): CHOL, HDL, LDLCALC, TRIG, CHOLHDL, LDLDIRECT in the last 72 hours. Thyroid Function Tests: Recent Labs    12/30/17 0436  TSH 1.118   Anemia Panel: No results for input(s): VITAMINB12, FOLATE, FERRITIN, TIBC, IRON, RETICCTPCT in the last 72 hours. Sepsis Labs: No results for input(s): PROCALCITON, LATICACIDVEN in the last 168 hours.  No results found for this or any previous visit (from the past 240 hour(s)).       Radiology Studies: Dg Ankle Complete Right  Result Date: 12/29/2017 CLINICAL DATA:  Recent fall down stairs with lateral ankle pain, initial encounter EXAM: RIGHT ANKLE - COMPLETE 3+ VIEW COMPARISON:  None. FINDINGS: Oblique fracture through the distal fibular metaphysis is noted. Only mild displacement is noted. No tibial fracture is seen. Calcaneal spurring is noted. No other focal abnormality is seen. IMPRESSION: Oblique fracture in the distal fibula. Electronically Signed   By: Alcide Clever M.D.   On: 12/29/2017 13:44   Dg Knee Complete 4 Views Left  Result Date: 12/29/2017 CLINICAL DATA:  Acute LEFT knee pain following fall. Initial encounter. EXAM: LEFT KNEE - COMPLETE 4+ VIEW COMPARISON:  None. FINDINGS: No acute fracture, subluxation or dislocation identified. No joint effusion. Chondrocalcinosis noted. No suspicious focal bony lesions present. IMPRESSION: 1. No acute bony abnormality 2. Chondrocalcinosis Electronically Signed   By: Harmon Pier M.D.   On: 12/29/2017 20:11   Dg Foot Complete Right  Result Date: 12/29/2017 CLINICAL DATA:  Lateral ankle pain following fall down stairs, initial encounter EXAM: RIGHT FOOT COMPLETE - 3+ VIEW COMPARISON:   None. FINDINGS: Oblique fracture through the distal fibula is again identified and stable. No acute fracture or dislocation in the foot is noted. Calcaneal spurring is again seen. IMPRESSION: Distal fibular fracture.  No acute abnormality in the foot is seen. Electronically Signed   By: Alcide Clever M.D.   On: 12/29/2017 13:46        Scheduled Meds: . atorvastatin  20 mg Oral q1800  . docusate sodium  100 mg Oral BID  . FLUoxetine  40 mg Oral Daily  . gabapentin  300 mg Oral QHS  . levothyroxine  125 mcg Oral Daily  . senna  1 tablet Oral BID   Continuous Infusions:   LOS: 0 days    Time spent: 35    Delaine Lame, MD [facility]  If 7PM-7AM, please contact night-coverage [email or web address] Password [facility] 12/30/2017, 10:46 AM

## 2017-12-31 DIAGNOSIS — R262 Difficulty in walking, not elsewhere classified: Secondary | ICD-10-CM | POA: Diagnosis not present

## 2017-12-31 DIAGNOSIS — I10 Essential (primary) hypertension: Secondary | ICD-10-CM | POA: Diagnosis not present

## 2017-12-31 DIAGNOSIS — S82831A Other fracture of upper and lower end of right fibula, initial encounter for closed fracture: Principal | ICD-10-CM

## 2017-12-31 DIAGNOSIS — G35 Multiple sclerosis: Secondary | ICD-10-CM | POA: Diagnosis not present

## 2017-12-31 DIAGNOSIS — M2550 Pain in unspecified joint: Secondary | ICD-10-CM | POA: Diagnosis not present

## 2017-12-31 DIAGNOSIS — F329 Major depressive disorder, single episode, unspecified: Secondary | ICD-10-CM | POA: Diagnosis not present

## 2017-12-31 DIAGNOSIS — R52 Pain, unspecified: Secondary | ICD-10-CM | POA: Diagnosis not present

## 2017-12-31 DIAGNOSIS — F331 Major depressive disorder, recurrent, moderate: Secondary | ICD-10-CM | POA: Diagnosis not present

## 2017-12-31 DIAGNOSIS — E039 Hypothyroidism, unspecified: Secondary | ICD-10-CM | POA: Diagnosis not present

## 2017-12-31 DIAGNOSIS — S82409A Unspecified fracture of shaft of unspecified fibula, initial encounter for closed fracture: Secondary | ICD-10-CM | POA: Diagnosis not present

## 2017-12-31 DIAGNOSIS — Z7401 Bed confinement status: Secondary | ICD-10-CM | POA: Diagnosis not present

## 2017-12-31 DIAGNOSIS — S82891A Other fracture of right lower leg, initial encounter for closed fracture: Secondary | ICD-10-CM | POA: Diagnosis not present

## 2017-12-31 DIAGNOSIS — M6281 Muscle weakness (generalized): Secondary | ICD-10-CM | POA: Diagnosis not present

## 2017-12-31 DIAGNOSIS — S82831D Other fracture of upper and lower end of right fibula, subsequent encounter for closed fracture with routine healing: Secondary | ICD-10-CM | POA: Diagnosis not present

## 2017-12-31 DIAGNOSIS — R21 Rash and other nonspecific skin eruption: Secondary | ICD-10-CM | POA: Diagnosis not present

## 2017-12-31 DIAGNOSIS — E785 Hyperlipidemia, unspecified: Secondary | ICD-10-CM | POA: Diagnosis not present

## 2017-12-31 DIAGNOSIS — R278 Other lack of coordination: Secondary | ICD-10-CM | POA: Diagnosis not present

## 2017-12-31 DIAGNOSIS — R2689 Other abnormalities of gait and mobility: Secondary | ICD-10-CM | POA: Diagnosis not present

## 2017-12-31 MED ORDER — HYDROCODONE-ACETAMINOPHEN 5-325 MG PO TABS
1.0000 | ORAL_TABLET | Freq: Four times a day (QID) | ORAL | Status: DC | PRN
Start: 2017-12-31 — End: 2017-12-31

## 2017-12-31 NOTE — Progress Notes (Signed)
Report called to [facility] to Nia LPN with no complications or questions.

## 2017-12-31 NOTE — Care Management Obs Status (Addendum)
[facility] NOTIFICATION   Patient Details  Name: Cassandra Rios MRN: [id number] Date of Birth: July 18, 1950   Medicare Observation Status Notification Given:  No  Patient became upset as I was explaining the letter and refused to continue, asked me to leave the room, no letter was given.  Returned to room per request, patient more calm. Reviewed information and patient willing to sign. Copy provided to patient  Guadalupe Maple, RN 12/31/2017, 12:26 PM

## 2017-12-31 NOTE — Evaluation (Signed)
Occupational Therapy Evaluation Patient Details Name: Cassandra Rios MRN: [id number] DOB: 05/18/1951 Today's Date: 12/31/2017    History of Present Illness 67 year old with past medical history relevant for relapsing remitting multiple sclerosis, hypertension, hyperlipidemia, hypothyroidism, chronic pain who comes in after mechanical fall and found to have right acute distal  fibular fracture.  Patient was admitted due to difficulty with ambulation and poor home support;    Clinical Impression   This 67 yo female admitted with above presents to acute OT with decreased balance and decreased mobility thus making pt an increased fall risk with basic ADLs. Pt was totally independent with basic ADLs and IADLs pta and not using any AD. She will benefit from acute OT with follow up OT at SNF to get back to a Mod I level for basic ADLs.     Follow Up Recommendations  SNF;Supervision/Assistance - 24 hour    Equipment Recommendations  None recommended by OT       Precautions / Restrictions Precautions Precautions: Fall Required Braces or Orthoses: Other Brace/Splint Other Brace/Splint: camboot on RLE Restrictions Weight Bearing Restrictions: No Other Position/Activity Restrictions: WBAT RLE (in cam boot) and LLE      Mobility Bed Mobility Overal bed mobility: Modified Independent             General bed mobility comments: HOB up  Transfers Overall transfer level: Needs assistance Equipment used: Rolling walker (2 wheeled) Transfers: Sit to/from Stand Sit to Stand: Min assist         General transfer comment: light assist to rise and transition to RW    Balance Overall balance assessment: Needs assistance Sitting-balance support: No upper extremity supported Sitting balance-Leahy Scale: Good     Standing balance support: Bilateral upper extremity supported Standing balance-Leahy Scale: Poor Standing balance comment: reliant on UEs                             ADL either performed or assessed with clinical judgement   ADL Overall ADL's : Needs assistance/impaired Eating/Feeding: Independent;Sitting   Grooming: Set up;Sitting   Upper Body Bathing: Set up;Sitting   Lower Body Bathing: Minimal assistance;Sit to/from stand   Upper Body Dressing : Set up;Sitting   Lower Body Dressing: Moderate assistance Lower Body Dressing Details (indicate cue type and reason): min A sit<>stand Toilet Transfer: Minimal assistance;Ambulation;RW Toilet Transfer Details (indicate cue type and reason): bed>10 feet>rest in recliner>8 feet>rest in recliner and pushed back to room Toileting- Clothing Manipulation and Hygiene: Minimal assistance;Sit to/from stand               Vision Baseline Vision/History: Wears glasses Wears Glasses: Reading only              Pertinent Vitals/Pain Pain Assessment: Faces Faces Pain Scale: Hurts a little bit Pain Location: bil ankles (R>L); lower shin on RLE (cam boot rubbing--put black pad from cam boot under straps and pt stated this felt better) Pain Descriptors / Indicators: Aching;Sore Pain Intervention(s): Limited activity within patient's tolerance;Monitored during session;Premedicated before session;Repositioned     Hand Dominance Right   Extremity/Trunk Assessment Upper Extremity Assessment Upper Extremity Assessment: Overall WFL for tasks assessed(does get very weak in arms with ambulation due to increased weight she has to put on them due to pain in RLE)           Communication Communication Communication: No difficulties   Cognition Arousal/Alertness: Awake/alert Behavior During Therapy: [facility] for tasks assessed/performed  Overall Cognitive Status: Within Functional Limits for tasks assessed                                                Home Living Family/patient expects to be discharged to:: Skilled nursing facility Living Arrangements: Spouse/significant  other Available Help at Discharge: Family Type of Home: House Home Access: Stairs to enter [employer] of Steps: 3 or 2 Entrance Stairs-Rails: None Home Layout: One level               Home Equipment: Cane - single point;Walker - 4 wheels          Prior Functioning/Environment Level of Independence: Independent                 OT Problem List: Decreased strength;Decreased range of motion;Impaired balance (sitting and/or standing);Pain;Decreased knowledge of use of DME or AE      OT Treatment/Interventions: Self-care/ADL training;Balance training;DME and/or AE instruction;Patient/family education    OT Goals(Current goals can be found in the care plan section) Acute Rehab OT Goals Patient Stated Goal: go to rehab to get stronger then home OT Goal Formulation: With patient Time For Goal Achievement: 01/07/18 Potential to Achieve Goals: Good  OT Frequency: Min 2X/week              AM-PAC PT "6 Clicks" Daily Activity     Outcome Measure Help from another person eating meals?: None Help from another person taking care of personal grooming?: A Little Help from another person toileting, which includes using toliet, bedpan, or urinal?: A Little Help from another person bathing (including washing, rinsing, drying)?: A Little Help from another person to put on and taking off regular upper body clothing?: A Little Help from another person to put on and taking off regular lower body clothing?: A Little 6 Click Score: 19   End of Session Equipment Utilized During Treatment: Gait belt;Rolling walker  Activity Tolerance: Patient tolerated treatment well Patient left: in chair;with call bell/phone within reach  OT Visit Diagnosis: Unsteadiness on feet (R26.81);Other abnormalities of gait and mobility (R26.89);Repeated falls (R29.6);History of falling (Z91.81);Pain Pain - Right/Left: Right Pain - part of body: Ankle and joints of foot                Time:  8469-6295 OT Time Calculation (min): 33 min Charges:  OT General Charges $OT Visit: 1 Visit OT Evaluation $OT Eval Moderate Complexity: 1 Mod OT Treatments $Self Care/Home Management : 8-22 mins  Ignacia Palma, OTR/L [phone] 12/31/2017

## 2017-12-31 NOTE — Plan of Care (Signed)
Pt overall progressing well though still requires encouragement from nursing staff to ambulate and get out of bed.

## 2017-12-31 NOTE — Discharge Summary (Signed)
Physician Discharge Summary  Cassandra Rios UJW:[id number] DOB: 1950/10/04 DOA: 12/29/2017  PCP: [name], DO  Admit date: 12/29/2017 Discharge date: 12/31/2017  Admitted From: Home Disposition: Skilled nursing facility  Recommendations for Outpatient Follow-up:  1. Follow up with PCP in 1-2 weeks 2. Please obtain BMP/CBC in one week   Home Health: No Equipment/Devices: Cam boots  Discharge Condition: Stable CODE STATUS: Full Diet recommendation: Heart Healthy  Brief/Interim Summary:  #) Right fibular fracture distal: Orthopedic surgery felt the patient would manage well with conservative management.  Patient was given a cam boot.  She was given oral pain control.  She was discharged to skilled nursing due to her underlying comorbidities including relapsing remitting multiple sclerosis and poor help at home.  Exline #) Sees hypothyroid is him: Patient was continued on levothyroxine.  #) Hypertension/hyperlipidemia: Patient was continued on statin and telmisartan-HCTZ.  #) Pain/psych: Patient was continued on gabapentin 3 times daily and fluoxetine.  #) Relapsing remitting multiple sclerosis: Patient was continued on 2 different beta be every other day     Discharge Diagnoses:  Active Problems:   Hypothyroidism   Hyperlipidemia   MORBID OBESITY   MULTIPLE SCLEROSIS, RELAPSING/REMITTING   HTN (hypertension)   Closed fracture of right distal fibula   Ankle fracture    Discharge Instructions  Discharge Instructions    Call MD for:  difficulty breathing, headache or visual disturbances   Complete by:  As directed    Call MD for:  hives   Complete by:  As directed    Call MD for:  persistant dizziness or light-headedness   Complete by:  As directed    Call MD for:  persistant nausea and vomiting   Complete by:  As directed    Call MD for:  redness, tenderness, or signs of infection (pain, swelling, redness, odor or green/yellow discharge around  incision site)   Complete by:  As directed    Call MD for:  severe uncontrolled pain   Complete by:  As directed    Call MD for:  temperature >100.4   Complete by:  As directed    Diet - low sodium heart healthy   Complete by:  As directed    Discharge instructions   Complete by:  As directed    Please follow up with your pcp in 1-2weeks,   Increase activity slowly   Complete by:  As directed      Allergies as of 12/31/2017      Reactions   Codeine Itching, Nausea And Vomiting   Hydromorphone Hcl    REACTION: nausea/vomiting   Ibuprofen Other (See Comments)   REACTION: stomach ulcers   Morphine Sulfate    "makes my blood pressure bottom out"   Metronidazole Hives, Nausea And Vomiting, Rash      Medication List    TAKE these medications   atorvastatin 20 MG tablet Commonly known as:  LIPITOR TAKE 1 TABLET BY MOUTH EVERY DAY   clobetasol ointment 0.05 % Commonly known as:  TEMOVATE Apply 1 application topically every other day.   fluocinonide ointment 0.05 % Commonly known as:  LIDEX Apply 1 application topically 3 (three) times daily as needed (lichens sclerosus).   FLUoxetine 40 MG capsule Commonly known as:  PROZAC TAKE 1 CAPSULE BY MOUTH EVERY DAY   gabapentin 100 MG capsule Commonly known as:  NEURONTIN 300 mg every evening.   HYDROcodone-acetaminophen 5-325 MG tablet Commonly known as:  NORCO/VICODIN Take 1 tablet by  mouth every 6 (six) hours as needed for moderate pain.   Interferon Beta-1b 0.3 MG Kit injection Commonly known as:  BETASERON RECONSTITUTE AS DIRECTED AND INJECT 0.25MG  (= ) SUBCUTANEOUSLY ONCE EVERY OTHER DAY AS DIRECTED.   levothyroxine 125 MCG tablet Commonly known as:  SYNTHROID, LEVOTHROID TAKE 1 TABLET BY MOUTH EVERY DAY   mometasone 50 MCG/ACT nasal spray Commonly known as:  NASONEX Place 2 sprays into the nose daily as needed (allergies).   telmisartan-hydrochlorothiazide 40-12.5 MG tablet Commonly known as:  MICARDIS  HCT Take 1 tablet by mouth daily.   traMADol 50 MG tablet Commonly known as:  ULTRAM Take 1 tablet (50 mg total) by mouth every 6 (six) hours as needed for up to 5 days.      Follow-up Information    Schedule an appointment as soon as possible for a visit  with Zola Button, Grayling Congress, DO.   Specialty:  Family Medicine Contact information: [address] [place] [address] [phone]        [facility]-EMERGENCY DEPT.   Specialty:  Emergency Medicine Why:  Return as needed for new or worsening symptoms Contact information: [address] [phone] [place] [address] [phone]       Schedule an appointment as soon as possible for a visit  with Beverely Low, MD.   Specialty:  Orthopedic Surgery Contact information: [address] [place] [address] [phone]        Your Orthopedic Doctor.   Contact information: Please call to schedule follow-up as soon as possible.         Allergies  Allergen Reactions  . Codeine Itching and Nausea And Vomiting  . Hydromorphone Hcl     REACTION: nausea/vomiting  . Ibuprofen Other (See Comments)    REACTION: stomach ulcers  . Morphine Sulfate     "makes my blood pressure bottom out"  . Metronidazole Hives, Nausea And Vomiting and Rash    Consultations:  Orthopedic surgery   Procedures/Studies: Dg Ankle Complete Right  Result Date: 12/29/2017 CLINICAL DATA:  Recent fall down stairs with lateral ankle pain, initial encounter EXAM: RIGHT ANKLE - COMPLETE 3+ VIEW COMPARISON:  None. FINDINGS: Oblique fracture through the distal fibular metaphysis is noted. Only mild displacement is noted. No tibial fracture is seen. Calcaneal spurring is noted. No other focal abnormality is seen. IMPRESSION: Oblique fracture in the distal fibula. Electronically Signed   By: Alcide Clever M.D.   On: 12/29/2017 13:44   Dg Knee Complete 4 Views  Left  Result Date: 12/29/2017 CLINICAL DATA:  Acute LEFT knee pain following fall. Initial encounter. EXAM: LEFT KNEE - COMPLETE 4+ VIEW COMPARISON:  None. FINDINGS: No acute fracture, subluxation or dislocation identified. No joint effusion. Chondrocalcinosis noted. No suspicious focal bony lesions present. IMPRESSION: 1. No acute bony abnormality 2. Chondrocalcinosis Electronically Signed   By: Harmon Pier M.D.   On: 12/29/2017 20:11   Dg Foot Complete Right  Result Date: 12/29/2017 CLINICAL DATA:  Lateral ankle pain following fall down stairs, initial encounter EXAM: RIGHT FOOT COMPLETE - 3+ VIEW COMPARISON:  None. FINDINGS: Oblique fracture through the distal fibula is again identified and stable. No acute fracture or dislocation in the foot is noted. Calcaneal spurring is again seen. IMPRESSION: Distal fibular fracture.  No acute abnormality in the foot is seen. Electronically Signed   By: Alcide Clever M.D.   On: 12/29/2017 13:46     Subjective:  Discharge Exam: Vitals:   12/31/17 0652 12/31/17 0919  BP: 119/62   Pulse: (!) 56   Resp: 18   Temp: 97.8 F (36.6 C)   SpO2: 98% 97%   Vitals:   12/30/17 2015 12/31/17 0224 12/31/17 0652 12/31/17 0919  BP: (!) 135/59 (!) 137/55 119/62   Pulse: 63 67 (!) 56   Resp: 13 18 18    Temp: 98.2 F (36.8 C) 98.6 F (37 C) 97.8 F (36.6 C)   TempSrc: Oral Oral Oral   SpO2: 100% 96% 98% 97%  Weight:      Height:         General exam: Appears calm and comfortable  Respiratory system: Clear to auscultation. Respiratory effort normal. Cardiovascular system: Regular rate and rhythm, no murmurs Gastrointestinal system: Abdomen is nondistended, soft and nontender. No organomegaly or masses felt. Normal bowel sounds heard. Central nervous system: Alert and oriented. No focal neurological deficits. Extremities: Right lower extremity in elevated with ice pack on, intact distal pulses, 1+ bilateral lower extremity edema, good cap refill  bilaterally Skin: No rashes on visible skin Psychiatry: Judgement and insight appear normal. Mood & affect appropriate      The results of significant diagnostics from this hospitalization (including imaging, microbiology, ancillary and laboratory) are listed below for reference.     Microbiology: No results found for this or any previous visit (from the past 240 hour(s)).   Labs: BNP (last 3 results) No results for input(s): BNP in the last 8760 hours. Basic Metabolic Panel: Recent Labs  Lab 12/29/17 1338 12/30/17 0436  NA 139 136  K 4.0 4.0  CL 104 105  CO2 27 24  GLUCOSE 109* 101*  BUN 15 14  CREATININE 0.95 0.88  CALCIUM 9.6 9.2  MG  --  2.1  PHOS  --  4.2   Liver Function Tests: Recent Labs  Lab 12/30/17 0436  AST 18  ALT 16  ALKPHOS 61  BILITOT 0.6  PROT 6.2*  ALBUMIN 3.2*   No results for input(s): LIPASE, AMYLASE in the last 168 hours. No results for input(s): AMMONIA in the last 168 hours. CBC: Recent Labs  Lab 12/29/17 1338 12/30/17 0436  WBC 7.9 6.7  NEUTROABS 5.8  --   HGB 14.3 12.6  HCT 42.9 38.1  MCV 90.9 91.1  PLT 154 145*   Cardiac Enzymes: No results for input(s): CKTOTAL, CKMB, CKMBINDEX, TROPONINI in the last 168 hours. BNP: Invalid input(s): POCBNP CBG: No results for input(s): GLUCAP in the last 168 hours. D-Dimer No results for input(s): DDIMER in the last 72 hours. Hgb A1c No results for input(s): HGBA1C in the last 72 hours. Lipid Profile No results for input(s): CHOL, HDL, LDLCALC, TRIG, CHOLHDL, LDLDIRECT in the last 72 hours. Thyroid function studies Recent Labs    12/30/17 0436  TSH 1.118   Anemia work up No results for input(s): VITAMINB12, FOLATE, FERRITIN, TIBC, IRON, RETICCTPCT in the last 72 hours. Urinalysis    Component Value Date/Time   COLORURINE yellow 01/17/2010 0753   APPEARANCEUR Clear 01/17/2010 0753   LABSPEC <1.005 01/17/2010 0753   PHURINE 6.5 01/17/2010 0753   HGBUR negative 01/17/2010  0753   BILIRUBINUR neg 03/27/2016 1051   PROTEINUR neg 03/27/2016 1051   UROBILINOGEN 0.2 03/27/2016 1051   UROBILINOGEN 0.2 01/17/2010 0753   NITRITE neg 03/27/2016 1051   NITRITE negative 01/17/2010 0753   LEUKOCYTESUR Trace (A) 03/27/2016 1051   Sepsis Labs Invalid input(s): PROCALCITONIN,  WBC,  LACTICIDVEN Microbiology No  results found for this or any previous visit (from the past 240 hour(s)).   Time coordinating discharge: Over 30 minutes  SIGNED:   Delaine Lame, MD  [facility] 12/31/2017, 12:51 PM   If 7PM-7AM, please contact night-coverage [email or web address] Password [email or web address]

## 2017-12-31 NOTE — Progress Notes (Signed)
Physical Therapy Treatment Patient Details Name: Cassandra Rios MRN: [id number] DOB: Nov 08, 1950 Today's Date: 12/31/2017    History of Present Illness 67 year old with past medical history relevant for relapsing remitting multiple sclerosis, hypertension, hyperlipidemia, hypothyroidism, chronic pain who comes in after mechanical fall and found to have right acute distal  fibular fracture.  Patient was admitted due to difficulty with ambulation and poor home support;     PT Comments    Pt OOB in recliner with R CAM boot applied.  Pt c/o boot  "rubbing" on bare skin.  No specific orders for CAM boot.  Pt ankle Fx is "stable" with order for WBAT. " Distal Fibula non displaced Fx".  Removed CAM boot to inspect skin and wash. LE without movement.  Advised she could wear a long sock to reduce "rubbing".  Pt aware to keep R ankle elevated and apply ICE however boot obstructs contact.  Pt aware to wear boot when walking but questioned when sitting.  Left pt in recliner with R LE elevated and ICE.    Follow Up Recommendations  SNF     Equipment Recommendations  None recommended by PT    Recommendations for Other Services       Precautions / Restrictions Precautions Precautions: Fall Required Braces or Orthoses: Other Brace/Splint Other Brace/Splint: camboot on RLE Restrictions Weight Bearing Restrictions: No Other Position/Activity Restrictions: WBAT RLE (in cam boot) and LLE    Mobility  Bed Mobility Overal bed mobility: Modified Independent             General bed mobility comments: OOB in recliner   Transfers Overall transfer level: Needs assistance Equipment used: Rolling walker (2 wheeled) Transfers: Sit to/from Stand Sit to Stand: Min assist         General transfer comment: 25% VC's on safety with turns and hand placement with stand to sit  Ambulation/Gait Ambulation/Gait assistance: Min assist Gait Distance (Feet): 12 Feet Assistive device: Rolling walker (2  wheeled) Gait Pattern/deviations: Step-to pattern;Trunk flexed;Wide base of support;Decreased weight shift to right Gait velocity: decreased   General Gait Details: great difficulty supporting self with ealker c/o weak B UE's.  Unsteady.  Limited distance.     Stairs             Wheelchair Mobility    Modified Rankin (Stroke Patients Only)       Balance Overall balance assessment: Needs assistance Sitting-balance support: No upper extremity supported Sitting balance-Leahy Scale: Good     Standing balance support: Bilateral upper extremity supported Standing balance-Leahy Scale: Poor Standing balance comment: reliant on UEs                             Cognition Arousal/Alertness: Awake/alert Behavior During Therapy: WFL for tasks assessed/performed Overall Cognitive Status: Within Functional Limits for tasks assessed                                        Exercises      General Comments        Pertinent Vitals/Pain Pain Assessment: Faces Faces Pain Scale: Hurts a little bit Pain Location: R lateral ankle and "rubbing" from Cam Boot plus increased tarsal pressure on boot Pain Descriptors / Indicators: Aching;Throbbing Pain Intervention(s): Monitored during session;Repositioned;Ice applied    Home Living Family/patient expects to be discharged to:: Skilled nursing facility Living  Arrangements: Spouse/significant other Available Help at Discharge: Family Type of Home: House Home Access: Stairs to enter Entrance Stairs-Rails: None Home Layout: One level Home Equipment: Cane - single point;Walker - 4 wheels      Prior Function Level of Independence: Independent          PT Goals (current goals can now be found in the care plan section) Acute Rehab PT Goals Patient Stated Goal: go to rehab to get stronger then home Progress towards PT goals: Progressing toward goals    Frequency    Min 3X/week      PT Plan Current  plan remains appropriate    Co-evaluation              AM-PAC PT "6 Clicks" Daily Activity  Outcome Measure  Difficulty turning over in bed (including adjusting bedclothes, sheets and blankets)?: A Lot Difficulty moving from lying on back to sitting on the side of the bed? : A Lot Difficulty sitting down on and standing up from a chair with arms (e.g., wheelchair, bedside commode, etc,.)?: Unable Help needed moving to and from a bed to chair (including a wheelchair)?: A Little Help needed walking in hospital room?: A Little Help needed climbing 3-5 steps with a railing? : A Lot 6 Click Score: 13    End of Session Equipment Utilized During Treatment: Gait belt Activity Tolerance: Patient limited by fatigue Patient left: in chair;with chair alarm set;with call bell/phone within reach Nurse Communication: Mobility status PT Visit Diagnosis: Difficulty in walking, not elsewhere classified (R26.2);Unsteadiness on feet (R26.81);Pain Pain - Right/Left: Right Pain - part of body: Ankle and joints of foot     Time: 1010-1035 PT Time Calculation (min) (ACUTE ONLY): 25 min  Charges:  $Gait Training: 8-22 mins $Therapeutic Activity: 8-22 mins                     Felecia Shelling  PTA [facility]  Acute  Rehab Pager      [phone]

## 2017-12-31 NOTE — [place] FL2 (Addendum)
[place] MEDICAID FL2 LEVEL OF CARE SCREENING TOOL     IDENTIFICATION  Patient Name: Cassandra Rios Birthdate: [birth year] Sex: female Admission Date (Current Location): 12/29/2017  [facility] and [place] Number:  Producer, television/film/video and Address:  [facility],  [address] [place] [address]      Provider Number: [id number]  Attending Physician Name and Address:  Delaine Lame, MD  Relative Name and Phone Number:       Current Level of Care: SNF Recommended Level of Care: Skilled Nursing Facility Prior Approval Number:    Date Approved/Denied:   PASRR Number:   [id number]   Discharge Plan:      Current Diagnoses: Patient Active Problem List   Diagnosis Date Noted  . Closed fracture of right distal fibula 12/29/2017  . Ankle fracture 12/29/2017  . Hyperlipidemia LDL goal <100 09/27/2017  . Senile purpura (HCC) 03/29/2017  . Lichen sclerosus of female genitalia 10/23/2016  . Breast pain, left 03/27/2016  . Chest pain 03/27/2016  . HTN (hypertension) 09/24/2015  . Sinusitis, acute 03/23/2015  . Depression 07/06/2014  . Thoracic back pain 07/06/2014  . UTI (urinary tract infection) 04/28/2014  . Cellulitis and abscess of trunk 02/16/2014  . Severe obesity (BMI >= 40) (HCC) 06/30/2013  . Breast mass, right 01/10/2013  . Acute sinusitis 03/22/2009  . MULTIPLE SCLEROSIS, RELAPSING/REMITTING 01/04/2009  . THROMBOCYTOPENIA 09/15/2008  . SHOULDER PAIN, LEFT 09/15/2008  . BARIATRIC SURGERY STATUS 09/15/2008  . Hypothyroidism 07/20/2008  . HIATAL HERNIA 07/20/2008  . COPD with acute bronchitis (HCC) 07/23/2007  . ABNORMAL PULMONARY TEST RESULTS 07/23/2007  . Allergic rhinitis 06/26/2007  . HOARSENESS, CHRONIC 06/26/2007  . WHEEZING 06/26/2007  . SNORING 06/26/2007  . COUGH 06/26/2007  . TOBACCO ABUSE-HISTORY OF 06/26/2007  . COLONIC POLYPS, HYPERPLASTIC 06/25/2007  . Hyperlipidemia 06/25/2007  . MORBID OBESITY 06/25/2007  . Depression with  anxiety 06/25/2007  . GASTRIC ULCER 06/25/2007  . COLONIC POLYPS, ADENOMATOUS, HX OF 06/25/2007    Orientation RESPIRATION BLADDER Height & Weight     Time, Situation, Self, Place  Normal Continent Weight: 250 lb (113.4 kg) Height:  5\' 6"  (167.6 cm)  BEHAVIORAL SYMPTOMS/MOOD NEUROLOGICAL BOWEL NUTRITION STATUS      Continent Diet(Heart Healthy)  AMBULATORY STATUS COMMUNICATION OF NEEDS Skin   Extensive Assist Verbally Normal                       Personal Care Assistance Level of Assistance  Bathing, Feeding, Dressing Bathing Assistance: Limited assistance Feeding assistance: Independent Dressing Assistance: Limited assistance     Functional Limitations Info  Sight, Hearing, Speech Sight Info: Adequate Hearing Info: Adequate Speech Info: Adequate    SPECIAL CARE FACTORS FREQUENCY  PT (By licensed PT), OT (By licensed OT)     PT Frequency: 7x/week OT Frequency: 7x/week            Contractures Contractures Info: Not present    Additional Factors Info  Code Status, Allergies Code Status Info: Fullcode Allergies Info: Codeine, Hydromorphone Hcl, Ibuprofen, Morphine Sulfate, Metronidazole           Current Medications (12/31/2017):  This is the current hospital active medication list Current Facility-Administered Medications  Medication Dose Route Frequency Provider Last Rate Last Dose  . acetaminophen (TYLENOL) tablet 650 mg  650 mg Oral Q6H PRN Therisa Doyne, MD   650 mg at 12/31/17 0555   Or  . acetaminophen (TYLENOL) suppository 650 mg  650 mg Rectal  Q6H PRN Therisa Doyne, MD      . atorvastatin (LIPITOR) tablet 20 mg  20 mg Oral q1800 Doutova, Anastassia, MD   20 mg at 12/30/17 1718  . docusate sodium (COLACE) capsule 100 mg  100 mg Oral BID Therisa Doyne, MD   100 mg at 12/31/17 0749  . enoxaparin (LOVENOX) injection 40 mg  40 mg Subcutaneous Q24H Purohit, Shrey C, MD   40 mg at 12/30/17 1138  . FLUoxetine (PROZAC) capsule 40 mg  40  mg Oral Daily Doutova, Anastassia, MD   40 mg at 12/31/17 0748  . gabapentin (NEURONTIN) capsule 300 mg  300 mg Oral QHS Doutova, Anastassia, MD   300 mg at 12/30/17 2128  . HYDROcodone-acetaminophen (NORCO/VICODIN) 5-325 MG per tablet 1-2 tablet  1-2 tablet Oral Q6H PRN Therisa Doyne, MD   1 tablet at 12/30/17 0957  . Interferon Beta-1b (BETASERON/EXTAVIA) injection 0.25 mg  0.25 mg Subcutaneous QODAY Purohit, Shrey C, MD   0.25 mg at 12/30/17 1507  . levothyroxine (SYNTHROID, LEVOTHROID) tablet 125 mcg  125 mcg Oral Daily Therisa Doyne, MD   125 mcg at 12/31/17 0748  . ondansetron (ZOFRAN) tablet 4 mg  4 mg Oral Q6H PRN Therisa Doyne, MD       Or  . ondansetron (ZOFRAN) injection 4 mg  4 mg Intravenous Q6H PRN Doutova, Anastassia, MD      . senna (SENOKOT) tablet 8.6 mg  1 tablet Oral BID Therisa Doyne, MD   8.6 mg at 12/30/17 2128  . traMADol (ULTRAM) tablet 100 mg  100 mg Oral Q6H PRN Therisa Doyne, MD   100 mg at 12/31/17 0555     Discharge Medications: Please see discharge summary for a list of discharge medications.  Relevant Imaging Results:  Relevant Lab Results:   Additional Information ssn:[id number]  Clearance Coots, LCSW

## 2017-12-31 NOTE — Clinical Social Work Note (Signed)
Clinical Social Work Assessment  Patient Details  Name: Cassandra Rios MRN: [id number] Date of Birth: 11-18-1950  Date of referral:  12/31/17               Reason for consult:                   Permission sought to share information with:  Family Supports Permission granted to share information::  Yes, Verbal Permission Granted  Name::        Agency::     Relationship::   Spouse  Contact Information:     Housing/Transportation Living arrangements for the past 2 months:  Single Family Home Source of Information:  Patient Patient Interpreter Needed:  None Criminal Activity/Legal Involvement Pertinent to Current Situation/Hospitalization:  No - Comment as needed Significant Relationships:  Spouse Lives with:  Self Do you feel safe going back to the place where you live?  Yes Need for family participation in patient care:  Yes (Comment)  Care giving concerns:  Patient admitted for right ankle pain.  SNF for short rehab before returning home.    Social Worker assessment / plan:  CSW met with patient at bedside to discuss discharge plan to SNF. CSW explain SNF process and HTA mandatory prior approval before discharge. Patient reports she family that stayed in SNF and is only familiar with longterm care, not rehab. Patient reports she prefers a facility in [place] so that her husband can visit. Patient hopes to stay for 3-5 days and then transition home.  FL2 complete. PASRR done.   Plan: SNF Patient will need prior insurance approval.   Employment status:  Retired Nurse, adult PT Recommendations:  [place] / Referral to community resources:  [place]  Patient/Family's Response to care:  Agreeable and Responding well to care.  Patient/Family's Understanding of and Emotional Response to Diagnosis, Current Treatment, and Prognosis:  Patient has a good understanding of her diagnosis and follow up care.    Emotional Assessment Appearance:  Appears stated age Attitude/Demeanor/Rapport:    Affect (typically observed):  Accepting, Pleasant Orientation:  Oriented to Self, Oriented to Place, Oriented to  Time, Oriented to Situation Alcohol / Substance use:    Psych involvement (Current and /or in the community):  No (Comment)  Discharge Needs  Concerns to be addressed:  Decision making concerns Readmission within the last 30 days:  No Current discharge risk:  None Barriers to Discharge:  Continued Medical Work up, [place], LCSW 12/31/2017, 10:00 AM

## 2017-12-31 NOTE — Plan of Care (Addendum)
Pt discharged to [facility] via EMS in stable condition with no needs. Pt had no questions at time of discharge concerning instructions and education. VS stable at time of discharge. RN will call report to [facility].

## 2017-12-31 NOTE — Progress Notes (Signed)
 PROGRESS NOTE    Cassandra Rios  NWG:[id number] DOB: [birth year] DOA: 12/29/2017 PCP: [name], DO    Brief Narrative:  67 year old with past medical history relevant for relapsing remitting multiple sclerosis on interferon beta, hypertension, hyperlipidemia, hypothyroidism, chronic pain who comes in after mechanical fall and found to have right acute fibular fracture.  Patient was admitted due to difficulty with ambulation and poor home support.   Assessment & Plan:   Active Problems:   Hypothyroidism   Hyperlipidemia   MORBID OBESITY   MULTIPLE SCLEROSIS, RELAPSING/REMITTING   HTN (hypertension)   Closed fracture of right distal fibula   Ankle fracture   #) Right fibular fracture distal: Per orthopedic surgery they feel that she should do well with conservative management. -Outpatient follow-up with orthopedic surgery -PRN hydrocodone, tramadol, acetaminophen for pain control -Physical therapy consult recommends skilled nursing facility  #) Hypothyroidism: -Continue levothyroxine 125 mcg daily  #) Hyperlipidemia: -Continue atorvastatin 20 mg daily  #) Pain/psych: -Continue gabapentin 3 mg nightly -Continue fluoxetine 40 mill grams daily   #) Hypertension: -Continue telmisartan-HCTZ 40-12.5 mg daily  #) Relapsing remitting multiple sclerosis: -Continue interferon beta 0.25 mg every other day  Fluids: Tolerating p.o. Electrolytes: Monitor and supplement Nutrition: Heart healthy diet  Prophylaxis: Enoxaparin  Disposition: Pending skilled nursing facility placement  Full code   Consultants:   Orthopedic surgery  Procedures:  None  Antimicrobials:   None   Subjective: Patient reports that her pain is under much better control.  She denies any nausea, vomiting, diarrhea.  She denies any cough, congestion, rhinorrhea.  Objective: Vitals:   12/30/17 2015 12/31/17 0224 12/31/17 0652 12/31/17 0919  BP: (!) 135/59 (!) 137/55 119/62     Pulse: 63 67 (!) 56   Resp: 13 18 18    Temp: 98.2 F (36.8 C) 98.6 F (37 C) 97.8 F (36.6 C)   TempSrc: Oral Oral Oral   SpO2: 100% 96% 98% 97%  Weight:      Height:        Intake/Output Summary (Last 24 hours) at 12/31/2017 1024 Last data filed at 12/31/2017 0958 Gross per 24 hour  Intake 1200 ml  Output 900 ml  Net 300 ml   Filed Weights   12/29/17 1245  Weight: 113.4 kg (250 lb)    Examination:  General exam: Appears calm and comfortable  Respiratory system: Clear to auscultation. Respiratory effort normal. Cardiovascular system: Regular rate and rhythm, no murmurs Gastrointestinal system: Abdomen is nondistended, soft and nontender. No organomegaly or masses felt. Normal bowel sounds heard. Central nervous system: Alert and oriented. No focal neurological deficits. Extremities: Right lower extremity in elevated with ice pack on, intact distal pulses, 1+ bilateral lower extremity edema, good cap refill bilaterally Skin: No rashes on visible skin Psychiatry: Judgement and insight appear normal. Mood & affect appropriate.     Data Reviewed: I have personally reviewed following labs and imaging studies  CBC: Recent Labs  Lab 12/29/17 1338 12/30/17 0436  WBC 7.9 6.7  NEUTROABS 5.8  --   HGB 14.3 12.6  HCT 42.9 38.1  MCV 90.9 91.1  PLT 154 145*   Basic Metabolic Panel: Recent Labs  Lab 12/29/17 1338 12/30/17 0436  NA 139 136  K 4.0 4.0  CL 104 105  CO2 27 24  GLUCOSE 109* 101*  BUN 15 14  CREATININE 0.95 0.88  CALCIUM 9.6 9.2  MG  --  2.1  PHOS  --  4.2   GFR: Estimated  Creatinine Clearance: 79.2 mL/min (by C-G formula based on SCr of 0.88 mg/dL). Liver Function Tests: Recent Labs  Lab 12/30/17 0436  AST 18  ALT 16  ALKPHOS 61  BILITOT 0.6  PROT 6.2*  ALBUMIN 3.2*   No results for input(s): LIPASE, AMYLASE in the last 168 hours. No results for input(s): AMMONIA in the last 168 hours. Coagulation Profile: No results for input(s): INR,  PROTIME in the last 168 hours. Cardiac Enzymes: No results for input(s): CKTOTAL, CKMB, CKMBINDEX, TROPONINI in the last 168 hours. BNP (last 3 results) No results for input(s): PROBNP in the last 8760 hours. HbA1C: No results for input(s): HGBA1C in the last 72 hours. CBG: No results for input(s): GLUCAP in the last 168 hours. Lipid Profile: No results for input(s): CHOL, HDL, LDLCALC, TRIG, CHOLHDL, LDLDIRECT in the last 72 hours. Thyroid Function Tests: Recent Labs    12/30/17 0436  TSH 1.118   Anemia Panel: No results for input(s): VITAMINB12, FOLATE, FERRITIN, TIBC, IRON, RETICCTPCT in the last 72 hours. Sepsis Labs: No results for input(s): PROCALCITON, LATICACIDVEN in the last 168 hours.  No results found for this or any previous visit (from the past 240 hour(s)).       Radiology Studies: Dg Ankle Complete Right  Result Date: 12/29/2017 CLINICAL DATA:  Recent fall down stairs with lateral ankle pain, initial encounter EXAM: RIGHT ANKLE - COMPLETE 3+ VIEW COMPARISON:  None. FINDINGS: Oblique fracture through the distal fibular metaphysis is noted. Only mild displacement is noted. No tibial fracture is seen. Calcaneal spurring is noted. No other focal abnormality is seen. IMPRESSION: Oblique fracture in the distal fibula. Electronically Signed   By: Alcide Clever M.D.   On: 12/29/2017 13:44   Dg Knee Complete 4 Views Left  Result Date: 12/29/2017 CLINICAL DATA:  Acute LEFT knee pain following fall. Initial encounter. EXAM: LEFT KNEE - COMPLETE 4+ VIEW COMPARISON:  None. FINDINGS: No acute fracture, subluxation or dislocation identified. No joint effusion. Chondrocalcinosis noted. No suspicious focal bony lesions present. IMPRESSION: 1. No acute bony abnormality 2. Chondrocalcinosis Electronically Signed   By: Harmon Pier M.D.   On: 12/29/2017 20:11   Dg Foot Complete Right  Result Date: 12/29/2017 CLINICAL DATA:  Lateral ankle pain following fall down stairs, initial  encounter EXAM: RIGHT FOOT COMPLETE - 3+ VIEW COMPARISON:  None. FINDINGS: Oblique fracture through the distal fibula is again identified and stable. No acute fracture or dislocation in the foot is noted. Calcaneal spurring is again seen. IMPRESSION: Distal fibular fracture.  No acute abnormality in the foot is seen. Electronically Signed   By: Alcide Clever M.D.   On: 12/29/2017 13:46        Scheduled Meds: . atorvastatin  20 mg Oral q1800  . docusate sodium  100 mg Oral BID  . enoxaparin (LOVENOX) injection  40 mg Subcutaneous Q24H  . FLUoxetine  40 mg Oral Daily  . gabapentin  300 mg Oral QHS  . Interferon Beta-1b  0.25 mg Subcutaneous QODAY  . levothyroxine  125 mcg Oral Daily  . senna  1 tablet Oral BID   Continuous Infusions:   LOS: 0 days    Time spent: 35    Delaine Lame, MD [facility]  If 7PM-7AM, please contact night-coverage [email or web address] Password [facility] 12/31/2017, 10:24 AM

## 2017-12-31 NOTE — Clinical Social Work Placement (Signed)
CLINICAL SOCIAL WORK PLACEMENT  NOTE  Date:  12/31/2017  Patient Details  Name: Cassandra Rios MRN: [id number] Date of Birth: [birth year]  Clinical Social Work is seeking post-discharge placement for this patient at the Skilled  Nursing Facility level of care (*CSW will initial, date and re-position this form in  chart as items are completed):  Yes   Patient/family provided with [facility] Clinical Social Work Department's list of facilities offering this level of care within the geographic area requested by the patient (or if unable, by the patient's family).  Yes   Patient/family informed of their freedom to choose among providers that offer the needed level of care, that participate in Medicare, Medicaid or managed care program needed by the patient, have an available bed and are willing to accept the patient.  Yes   Patient/family informed of 's ownership interest in [facility] and [facility], as well as of the fact that they are under no obligation to receive care at these facilities.  PASRR submitted to EDS on       PASRR number received on       Existing PASRR number confirmed on       FL2 transmitted to all facilities in geographic area requested by pt/family on       FL2 transmitted to all facilities within larger geographic area on       Patient informed that his/her managed care company has contracts with or will negotiate with certain facilities, including the following:  [facility]     Yes   Patient/family informed of bed offers received.  Patient chooses bed at [facility]     Physician recommends and patient chooses bed at      Patient to be transferred to [facility] on 12/31/17.  Patient to be transferred to facility by ptar     Patient family notified on 12/31/17 of transfer.  Name of family member notified:  Patient to notify her spouse     PHYSICIAN       Additional Comment:      _______________________________________________ Clearance Coots, LCSW 12/31/2017, 1:09 PM

## 2018-01-01 DIAGNOSIS — S82831D Other fracture of upper and lower end of right fibula, subsequent encounter for closed fracture with routine healing: Secondary | ICD-10-CM | POA: Diagnosis not present

## 2018-01-01 DIAGNOSIS — I10 Essential (primary) hypertension: Secondary | ICD-10-CM | POA: Diagnosis not present

## 2018-01-01 DIAGNOSIS — E039 Hypothyroidism, unspecified: Secondary | ICD-10-CM | POA: Diagnosis not present

## 2018-01-01 DIAGNOSIS — G35 Multiple sclerosis: Secondary | ICD-10-CM | POA: Diagnosis not present

## 2018-01-02 DIAGNOSIS — F329 Major depressive disorder, single episode, unspecified: Secondary | ICD-10-CM | POA: Diagnosis not present

## 2018-01-02 DIAGNOSIS — E039 Hypothyroidism, unspecified: Secondary | ICD-10-CM | POA: Diagnosis not present

## 2018-01-02 DIAGNOSIS — E785 Hyperlipidemia, unspecified: Secondary | ICD-10-CM | POA: Diagnosis not present

## 2018-01-02 DIAGNOSIS — I10 Essential (primary) hypertension: Secondary | ICD-10-CM | POA: Diagnosis not present

## 2018-01-02 DIAGNOSIS — S82409A Unspecified fracture of shaft of unspecified fibula, initial encounter for closed fracture: Secondary | ICD-10-CM | POA: Diagnosis not present

## 2018-01-02 DIAGNOSIS — G35 Multiple sclerosis: Secondary | ICD-10-CM | POA: Diagnosis not present

## 2018-01-03 ENCOUNTER — Other Ambulatory Visit: Payer: Self-pay | Admitting: *Deleted

## 2018-01-03 NOTE — Patient Outreach (Signed)
[place] [facility]) Care Management  01/03/2018  Cassandra Rios 1950/10/05 [id number]   Met with [name], SW at facility, she reports that she has not met with patient as of yet.  Care plan meeting set up for today.   Met with patient at bedside of facility. She reports her own goal is to "go home on Saturday". She reports that she has a meeting this afternoon to discuss her plans for discharge.  Patient has many questions about benefits, referred her to concierge for questions on hospital stay. Patient reports she may have need of a new walker, discussed that the facility should set up any DME or equipment needs upon discharge. The facility would also assess for home care needs at discharge.   RNCM reviewed [facility] care management. Patient states she does not feel she has any care management needs at this time. She has family friend building her a stair rail at her entrance. She plans for showers at a friends home who has a walk in shower or a sink bath. She may need a new walker and possibly an elevated toilet seat, facility should arrange for DME needs upon discharge. She accepted a [facility] care management packet.   Plan will collaborate with [facility] as indicated.  Will sign off but can be consulted again if needed. Royetta Crochet. Laymond Purser, RN, BSN, [place] [phone]) Business Cell  [phone]) Toll Free Office

## 2018-01-08 DIAGNOSIS — F329 Major depressive disorder, single episode, unspecified: Secondary | ICD-10-CM | POA: Diagnosis not present

## 2018-01-08 DIAGNOSIS — G35 Multiple sclerosis: Secondary | ICD-10-CM | POA: Diagnosis not present

## 2018-01-08 DIAGNOSIS — E039 Hypothyroidism, unspecified: Secondary | ICD-10-CM | POA: Diagnosis not present

## 2018-01-08 DIAGNOSIS — S82831D Other fracture of upper and lower end of right fibula, subsequent encounter for closed fracture with routine healing: Secondary | ICD-10-CM | POA: Diagnosis not present

## 2018-01-10 DIAGNOSIS — S82831A Other fracture of upper and lower end of right fibula, initial encounter for closed fracture: Secondary | ICD-10-CM | POA: Diagnosis not present

## 2018-01-11 DIAGNOSIS — I10 Essential (primary) hypertension: Secondary | ICD-10-CM | POA: Diagnosis not present

## 2018-01-11 DIAGNOSIS — G35 Multiple sclerosis: Secondary | ICD-10-CM | POA: Diagnosis not present

## 2018-01-11 DIAGNOSIS — S82831D Other fracture of upper and lower end of right fibula, subsequent encounter for closed fracture with routine healing: Secondary | ICD-10-CM | POA: Diagnosis not present

## 2018-01-11 DIAGNOSIS — R21 Rash and other nonspecific skin eruption: Secondary | ICD-10-CM | POA: Diagnosis not present

## 2018-01-15 ENCOUNTER — Other Ambulatory Visit: Payer: Self-pay

## 2018-01-15 NOTE — Patient Outreach (Addendum)
[name]) Care Management  01/15/2018  Cassandra Rios 1950-06-13 [id number]   Telephone assessment follow up Referral date: 01/15/18 Referral source: discharged from [place] and rehab center 01/12/18 Insurance: health team advantage  Transition of care will be completed by primary care provider office   Telephone call to patient regarding transition of care referral.  HIPAA verified. Explained reason for call. Patient states she is doing pretty good. Patient states she has not scheduled a follow up appointment with her primary MD as of yet. Patient states she saw her orthopedic surgeon last week and has another follow up on 02/07/18. Patient states she is not going to schedule a follow up with her primary MD right now because her and her husband have several doctor appointments this month.   Patient states she uses her walker for ambulation. She states she is getting around in the house pretty good.  Patient reviewed her discharged instructions with RNCM. Patient states her instructions states she is to have home health services. She states the information does not say which agency was referred. Patient states she has not heard from home health as of yet.   RNCM advised patient to call [facility] rehab center on tomorrow if she has not heard from a home health agency  to ask which agency she was referred to. Patient verbalized understanding.   Patient states her pain level is a 3 today. Patient states she takes her pain medication as needed and will rest and elevate her right leg.  RNCM discussed and offered [facility] care management services. Patient verbally agreed to ongoing transition of care follow up.  RNCM advised patient to notify MD of any changes in condition prior to scheduled appointment. RNCM provided contact name and number: [phone] or main office number [phone] and 24 hour nurse advise line [phone].  RNCM verified patient aware of 911  services for urgent/ emergent needs.,  PLAN: RNCM will send patient [facility] care management welcome packet/ consent form.  RNCM will send involvement letter to patients primary MD.  [facility] will follow up with patient within 1 week   [name] RN,BSN,CCM [facility] Telephonic  [phone]

## 2018-01-21 ENCOUNTER — Ambulatory Visit: Payer: Self-pay

## 2018-01-24 ENCOUNTER — Other Ambulatory Visit: Payer: Self-pay

## 2018-01-24 NOTE — Patient Outreach (Signed)
[place] [facility]) Care Management  01/24/2018  Cassandra Rios 07-12-1950 [id number]  Telephone assessment follow up Referral date: 01/15/18 Referral source: discharged from [place] and rehab center 01/12/18 Insurance: health team advantage Attempt #1  Telephone call to patient regarding transition of care  referral. Unable to reach patient or leave voice mail due to phone only ringing.   PLAN: RNCM will attempt 2nd telephone call to patient within 4 business days. RNCM will send outreach letter.   [name] RN,BSN,CCM [facility] Telephonic  [phone]

## 2018-01-29 ENCOUNTER — Ambulatory Visit: Payer: Self-pay

## 2018-01-29 ENCOUNTER — Telehealth: Payer: Self-pay | Admitting: *Deleted

## 2018-01-29 NOTE — Telephone Encounter (Signed)
Received fax concerning  [employer] PAP re enrollment for pt (ends 03/28/2018).  Her last fill was 12-03-17 and will be able to get one more in prior to new yr starts for her.  They will fax form.

## 2018-01-30 ENCOUNTER — Other Ambulatory Visit: Payer: Self-pay

## 2018-01-30 NOTE — Patient Outreach (Signed)
[place] [facility]) Care Management  01/30/2018  Cassandra Rios 18-Mar-1951 [id number]  Telephone assessment follow up Referral date:01/15/18 Referral source:discharged from [place] and rehab center 01/12/18 Insurance:health team advantage Attempt #2  Telephone call to patient regarding transition of care  referral. Unable to reach patient or leave voice mail due to phone only ringing.   PLAN: RNCM will attempt 3rd telephone call to patient within 4 business days.   [name] RN,BSN,CCM [facility] Telephonic  [phone]

## 2018-02-05 ENCOUNTER — Other Ambulatory Visit: Payer: Self-pay

## 2018-02-05 ENCOUNTER — Other Ambulatory Visit: Payer: Self-pay | Admitting: *Deleted

## 2018-02-05 DIAGNOSIS — G35 Multiple sclerosis: Secondary | ICD-10-CM

## 2018-02-05 MED ORDER — INTERFERON BETA-1B 0.3 MG ~~LOC~~ KIT: PACK | SUBCUTANEOUS | 3 refills | Status: DC

## 2018-02-05 NOTE — Telephone Encounter (Signed)
This prescription was signed and to be sent with pt application for [place] for betaseron.  Mailed to pt with her application.

## 2018-02-05 NOTE — Patient Outreach (Signed)
[place] [facility]) Care Management  02/05/2018  Alizae Bechtel 1950/10/10 [id number]  Telephone assessment follow up Referral date:01/15/18 Referral source:discharged from [place] and rehab center 01/12/18 Insurance:health team advantage Attempt #3  Telephone call to patient regardingtransition of carereferral. Unable to reach patient. HIPAA compliant voice message left with call back phone number.   PLAN: If no return call will proceed with case closure.   [name] RN,BSN,CCM [facility] Telephonic  [phone]

## 2018-02-05 NOTE — Telephone Encounter (Signed)
HCP part of form completed along with prescription, signed by MM/NP (mailed to pt along with pts part to be completed.  She will then mail or fax to [facility]).  I made copy of what I sent to her.

## 2018-02-07 DIAGNOSIS — S82831D Other fracture of upper and lower end of right fibula, subsequent encounter for closed fracture with routine healing: Secondary | ICD-10-CM | POA: Diagnosis not present

## 2018-02-11 ENCOUNTER — Ambulatory Visit: Payer: Self-pay

## 2018-02-18 ENCOUNTER — Other Ambulatory Visit: Payer: Self-pay | Admitting: Family Medicine

## 2018-02-18 DIAGNOSIS — J301 Allergic rhinitis due to pollen: Secondary | ICD-10-CM

## 2018-02-22 ENCOUNTER — Other Ambulatory Visit: Payer: Self-pay

## 2018-02-22 NOTE — Patient Outreach (Signed)
[place] [facility]) Care Management  02/22/2018  Cassandra Rios 12-21-1950 [id number]  Telephone assessment follow up Referral date:01/15/18 Referral source:discharged from [place] and rehab center 01/12/18 Insurance:health team advantage  No response after 3 telephone calls and outreach letter attempt.  PLAN: RNCM will close patient due to being unable to reach.  RNCM will send closure notification to patient's primary MD   [name] RN,BSN,CCM [facility] Telephonic  [phone]

## 2018-03-05 ENCOUNTER — Other Ambulatory Visit: Payer: Self-pay | Admitting: Family Medicine

## 2018-03-05 DIAGNOSIS — J301 Allergic rhinitis due to pollen: Secondary | ICD-10-CM

## 2018-03-07 DIAGNOSIS — S82831D Other fracture of upper and lower end of right fibula, subsequent encounter for closed fracture with routine healing: Secondary | ICD-10-CM | POA: Diagnosis not present

## 2018-03-08 MED ORDER — MOMETASONE FUROATE 50 MCG/ACT NA SUSP: 2.0000 | Freq: Every day | NASAL | 5 refills | Status: DC | PRN

## 2018-03-08 NOTE — Addendum Note (Signed)
Addended by: Magdalene Molly A on: 03/08/2018 12:01 PM   Modules accepted: Orders

## 2018-03-14 ENCOUNTER — Encounter: Payer: Self-pay | Admitting: Adult Health

## 2018-03-14 ENCOUNTER — Ambulatory Visit: Payer: PPO | Admitting: Adult Health

## 2018-03-14 VITALS — BP 147/76 | HR 76 | Ht 66.0 in | Wt 246.2 lb

## 2018-03-14 DIAGNOSIS — G35 Multiple sclerosis: Secondary | ICD-10-CM | POA: Diagnosis not present

## 2018-03-14 NOTE — Patient Instructions (Signed)
Your Plan:  Continue betaseron If your symptoms worsen or you develop new symptoms please let us know.    Thank you for coming to see [name] at [facility]. I hope we have been able to provide you high quality care today.  You may receive a patient satisfaction survey over the next few weeks. We would appreciate your feedback and comments so that we may continue to improve ourselves and the health of our patients.

## 2018-03-14 NOTE — Progress Notes (Signed)
PATIENT: Cassandra Rios DOB: 10-07-1950  REASON FOR VISIT: follow up HISTORY FROM: patient  HISTORY OF PRESENT ILLNESS: Today 03/14/18: [name] is a 67 year old female with a history of multiple sclerosis.  She returns today for follow-up.  She remains on Betaseron and is tolerating it well.  She states that since the last visit she fell and fractured her right fibula.  She reports that she had to wear a boot for 6 weeks and now in a soft cast.  She reports that she has resumed driving.  She reports that the fall occurred because she was going from one room to another while visiting a family member and missed a step.  Denies any new numbness and tingling.  Denies any significant changes with bowels or bladder.  She reports that she had both cataracts removed and implants.  She reports she now only has to use reading glasses.  She returns today for evaluation.  HISTORY 09/10/17 [name] is a 67 year old female with a history of multiple sclerosis.  She returns today for follow-up.  She continues on Betaseron and is tolerating it well.  She states over the last year she feels like her symptoms have gotten worse.  She has noticed a change in her strength primarily in her legs.  She states that if she is walking upstairs she has to havea railing in order to get up the steps.  She states that there are times that she is walking and she will become off balance.  She states that if she changes directions quickly this will also cause her to feel off balance.  She denies any changes with bowel or bladder.  Denies any significant changes in her vision.  Reports that she does have cataracts and will be seeing her ophthalmologist next month.  She also reports tingling in the lower extremities.  She states that this is a newer symptom and has been going on for several months.  She returns today for an evaluation.  REVIEW OF SYSTEMS: Out of a complete 14 system review of symptoms, the patient  complains only of the following symptoms, and all other reviewed systems are negative.  Eye itching, wheezing, frequent waking, daytime sleepiness, heat intolerance, bladder, frequency of urination, joint pain, back pain, walking difficulty, neck pain, neck stiffness, headache, weakness, bruise/bleed easily.  ALLERGIES: Allergies  Allergen Reactions  . Codeine Itching and Nausea And Vomiting  . Hydromorphone Hcl     REACTION: nausea/vomiting  . Ibuprofen Other (See Comments)    REACTION: stomach ulcers  . Morphine Sulfate     "makes my blood pressure bottom out"  . Metronidazole Hives, Nausea And Vomiting and Rash    HOME MEDICATIONS: Outpatient Medications Prior to Visit  Medication Sig Dispense Refill  . atorvastatin (LIPITOR) 20 MG tablet TAKE 1 TABLET BY MOUTH EVERY DAY 90 tablet 1  . clobetasol ointment (TEMOVATE) 0.05 % Apply 1 application topically every other day.    Marland Kitchen FLUoxetine (PROZAC) 40 MG capsule TAKE 1 CAPSULE BY MOUTH EVERY DAY 90 capsule 1  . gabapentin (NEURONTIN) 100 MG capsule 300 mg every evening.    Marland Kitchen HYDROcodone-acetaminophen (NORCO/VICODIN) 5-325 MG per tablet Take 1 tablet by mouth every 6 (six) hours as needed for moderate pain.    . Interferon Beta-1b (BETASERON) 0.3 MG KIT injection RECONSTITUTE AS DIRECTED AND INJECT 0.25MG  (= ) SUBCUTANEOUSLY ONCE EVERY OTHER DAY AS DIRECTED. 42 each 3  . levothyroxine (SYNTHROID, LEVOTHROID) 125 MCG tablet TAKE 1 TABLET  BY MOUTH EVERY DAY 90 tablet 1  . mometasone (NASONEX) 50 MCG/ACT nasal spray Place 2 sprays into the nose daily as needed. 16 g 5  . telmisartan-hydrochlorothiazide (MICARDIS HCT) 40-12.5 MG tablet Take 1 tablet by mouth daily. 90 tablet 1  . traMADol (ULTRAM) 50 MG tablet Take by mouth every 6 (six) hours as needed.    . fluocinonide ointment (LIDEX) 0.05 % Apply 1 application topically 3 (three) times daily as needed (lichens sclerosus).     Marland Kitchen ibuprofen (ADVIL,MOTRIN) 800 MG tablet Take 800 mg by  mouth every 8 (eight) hours as needed.     No facility-administered medications prior to visit.     PAST MEDICAL HISTORY: Past Medical History:  Diagnosis Date  . AAA (abdominal aortic aneurysm) (HCC)   . Allergic rhinitis   . Chronic hoarseness   . Colon polyps   . COPD (chronic obstructive pulmonary disease) (HCC)    Dr. Marchelle Gearing  . Dyslipidemia   . Hypertension   . Hypothyroidism   . MS (multiple sclerosis) (HCC)   . Obesity   . Stomach ulcer from aspirin/ibuprofen-like drugs (NSAID's)     PAST SURGICAL HISTORY: Past Surgical History:  Procedure Laterality Date  . ABDOMINAL HYSTERECTOMY    . BARIATRIC SURGERY  09/07/08   lap band  . CARPAL TUNNEL RELEASE    . CATARACT EXTRACTION, BILATERAL      Toric lenses for astigmatism  . CHOLECYSTECTOMY    . CHOLECYSTECTOMY    . HIATAL HERNIA REPAIR  09/07/08    FAMILY HISTORY: Family History  Problem Relation Age of Onset  . COPD Father   . Heart disease Father   . AAA (abdominal aortic aneurysm) Father   . COPD Sister   . Arthritis Sister   . AAA (abdominal aortic aneurysm) Sister   . Dementia Sister   . Other Sister        brain injury  . Stroke Mother 65  . Heart disease Mother        congenital heart defect?    SOCIAL HISTORY: Social History   Socioeconomic History  . Marital status: Married    Spouse name: Casimiro Needle  . Number of children: 2  . Years of education: 2  . Highest education level: Not on file  Occupational History  . Occupation: retired  Engineer, production  . Financial resource strain: Not on file  . Food insecurity:    Worry: Not on file    Inability: Not on file  . Transportation needs:    Medical: Not on file    Non-medical: Not on file  Tobacco Use  . Smoking status: Former Smoker    Packs/day: 2.00    Years: 35.00    Pack years: 70.00    Types: Cigarettes    Last attempt to quit: 04/05/2007    Years since quitting: 10.9  . Smokeless tobacco: Never Used  Substance and Sexual Activity   . Alcohol use: No    Comment: very rare  . Drug use: No  . Sexual activity: Never    Partners: Male  Lifestyle  . Physical activity:    Days per week: Not on file    Minutes per session: Not on file  . Stress: Not on file  Relationships  . Social connections:    Talks on phone: Not on file    Gets together: Not on file    Attends religious service: Not on file    Active member of club or organization: Not  on file    Attends meetings of clubs or organizations: Not on file    Relationship status: Not on file  . Intimate partner violence:    Fear of current or ex partner: Not on file    Emotionally abused: Not on file    Physically abused: Not on file    Forced sexual activity: Not on file  Other Topics Concern  . Not on file  Social History Narrative   Patient is right handed,reside in home with husband      PHYSICAL EXAM  Vitals:   03/14/18 1426  BP: (!) 147/76  Pulse: 76  Weight: 246 lb 3.2 oz (111.7 kg)  Height: 5\' 6"  (1.676 m)   Body mass index is 39.74 kg/m.  Generalized: Well developed, in no acute distress   Neurological examination  Mentation: Alert oriented to time, place, history taking. Follows all commands speech and language fluent Cranial nerve II-XII: Pupils were equal round reactive to light. Extraocular movements were full, visual field were full on confrontational test. Facial sensation and strength were normal. Uvula tongue midline. Head turning and shoulder shrug  were normal and symmetric. Motor: The motor testing reveals 5 over 5 strength of all 4 extremities. Good symmetric motor tone is noted throughout.  Sensory: Sensory testing is intact to soft touch on all 4 extremities. No evidence of extinction is noted.  Coordination: Cerebellar testing reveals good finger-nose-finger and heel-to-shin bilaterally.  Gait and station: Gait is slightly unsteady due to boot on the right foot. Reflexes: Deep tendon reflexes are symmetric and normal  bilaterally.   DIAGNOSTIC DATA (LABS, IMAGING, TESTING) - I reviewed patient records, labs, notes, testing and imaging myself where available.  Lab Results  Component Value Date   WBC 6.7 12/30/2017   HGB 12.6 12/30/2017   HCT 38.1 12/30/2017   MCV 91.1 12/30/2017   PLT 145 (L) 12/30/2017      Component Value Date/Time   NA 136 12/30/2017 0436   NA 139 09/05/2016 0944   K 4.0 12/30/2017 0436   CL 105 12/30/2017 0436   CO2 24 12/30/2017 0436   GLUCOSE 101 (H) 12/30/2017 0436   BUN 14 12/30/2017 0436   BUN 13 09/05/2016 0944   CREATININE 0.88 12/30/2017 0436   CREATININE 1.04 05/13/2014 0840   CALCIUM 9.2 12/30/2017 0436   PROT 6.2 (L) 12/30/2017 0436   PROT 6.7 09/05/2016 0944   ALBUMIN 3.2 (L) 12/30/2017 0436   ALBUMIN 4.0 09/05/2016 0944   AST 18 12/30/2017 0436   ALT 16 12/30/2017 0436   ALKPHOS 61 12/30/2017 0436   BILITOT 0.6 12/30/2017 0436   BILITOT 0.4 09/05/2016 0944   GFRNONAA >60 12/30/2017 0436   GFRNONAA 57 (L) 05/13/2014 0840   GFRAA >60 12/30/2017 0436   GFRAA 66 05/13/2014 0840   Lab Results  Component Value Date   CHOL 167 09/27/2017   HDL 48.10 09/27/2017   LDLCALC 91 09/27/2017   TRIG 141.0 09/27/2017   CHOLHDL 3 09/27/2017   Lab Results  Component Value Date   HGBA1C 5.9 09/25/2016   Lab Results  Component Value Date   VITAMINB12 648 03/16/2010   Lab Results  Component Value Date   TSH 1.118 12/30/2017      ASSESSMENT AND PLAN 67 y.o. year old female  has a past medical history of AAA (abdominal aortic aneurysm) (HCC), Allergic rhinitis, Chronic hoarseness, Colon polyps, COPD (chronic obstructive pulmonary disease) (HCC), Dyslipidemia, Hypertension, Hypothyroidism, MS (multiple sclerosis) (HCC), Obesity, and Stomach ulcer  from aspirin/ibuprofen-like drugs (NSAID's). here with :  1.  Multiple sclerosis  Overall the patient is doing well.  She will continue on Betaseron.  She recently had blood work in July that was unremarkable.   Her last MRI did not show any progression.  She is advised that if her symptoms worsen or she develops new symptoms she should let us know.  She will follow-up in 6 months or sooner if needed.  I spent 15 minutes with the patient. 50% of this time was spent reviewing plan of care   Butch Penny, MSN, NP-C 03/14/2018, 2:44 PM [facility] [address], [address] [place], [place] [address] [phone]

## 2018-03-18 DIAGNOSIS — J329 Chronic sinusitis, unspecified: Secondary | ICD-10-CM | POA: Diagnosis not present

## 2018-03-18 DIAGNOSIS — R51 Headache: Secondary | ICD-10-CM | POA: Diagnosis not present

## 2018-03-18 DIAGNOSIS — R519 Headache, unspecified: Secondary | ICD-10-CM | POA: Insufficient documentation

## 2018-03-29 DIAGNOSIS — L900 Lichen sclerosus et atrophicus: Secondary | ICD-10-CM | POA: Diagnosis not present

## 2018-03-31 DIAGNOSIS — L900 Lichen sclerosus et atrophicus: Secondary | ICD-10-CM | POA: Insufficient documentation

## 2018-04-08 ENCOUNTER — Other Ambulatory Visit: Payer: Self-pay | Admitting: Family Medicine

## 2018-04-15 ENCOUNTER — Telehealth: Payer: Self-pay | Admitting: *Deleted

## 2018-04-15 NOTE — Telephone Encounter (Signed)
Received fax from [employer] [name] [employer].  Case # [id number].  Stated please to inform you that your pt. Is eligible to received betaseron at not cost for enrollment period 04/15/2018 to 06/04/2018.

## 2018-04-19 ENCOUNTER — Encounter: Payer: Self-pay | Admitting: Family Medicine

## 2018-04-19 ENCOUNTER — Ambulatory Visit (INDEPENDENT_AMBULATORY_CARE_PROVIDER_SITE_OTHER): Payer: PPO | Admitting: Family Medicine

## 2018-04-19 VITALS — BP 143/71 | HR 61 | Temp 97.7°F | Resp 16 | Ht 66.0 in | Wt 245.4 lb

## 2018-04-19 DIAGNOSIS — F418 Other specified anxiety disorders: Secondary | ICD-10-CM | POA: Diagnosis not present

## 2018-04-19 DIAGNOSIS — D692 Other nonthrombocytopenic purpura: Secondary | ICD-10-CM

## 2018-04-19 DIAGNOSIS — I10 Essential (primary) hypertension: Secondary | ICD-10-CM | POA: Diagnosis not present

## 2018-04-19 DIAGNOSIS — I714 Abdominal aortic aneurysm, without rupture, unspecified: Secondary | ICD-10-CM | POA: Insufficient documentation

## 2018-04-19 DIAGNOSIS — Z87891 Personal history of nicotine dependence: Secondary | ICD-10-CM

## 2018-04-19 DIAGNOSIS — J440 Chronic obstructive pulmonary disease with acute lower respiratory infection: Secondary | ICD-10-CM

## 2018-04-19 DIAGNOSIS — R51 Headache: Secondary | ICD-10-CM

## 2018-04-19 DIAGNOSIS — Z23 Encounter for immunization: Secondary | ICD-10-CM | POA: Diagnosis not present

## 2018-04-19 DIAGNOSIS — E785 Hyperlipidemia, unspecified: Secondary | ICD-10-CM

## 2018-04-19 DIAGNOSIS — E039 Hypothyroidism, unspecified: Secondary | ICD-10-CM

## 2018-04-19 DIAGNOSIS — G35 Multiple sclerosis: Secondary | ICD-10-CM

## 2018-04-19 DIAGNOSIS — Z0000 Encounter for general adult medical examination without abnormal findings: Secondary | ICD-10-CM

## 2018-04-19 DIAGNOSIS — Z79899 Other long term (current) drug therapy: Secondary | ICD-10-CM

## 2018-04-19 DIAGNOSIS — G8929 Other chronic pain: Secondary | ICD-10-CM

## 2018-04-19 DIAGNOSIS — R519 Headache, unspecified: Secondary | ICD-10-CM | POA: Insufficient documentation

## 2018-04-19 DIAGNOSIS — J209 Acute bronchitis, unspecified: Secondary | ICD-10-CM

## 2018-04-19 LAB — CBC WITH DIFFERENTIAL/PLATELET
BASOS ABS: 0 10*3/uL (ref 0.0–0.1)
Basophils Relative: 0.7 % (ref 0.0–3.0)
EOS ABS: 0.1 10*3/uL (ref 0.0–0.7)
Eosinophils Relative: 1.9 % (ref 0.0–5.0)
HEMATOCRIT: 41.4 % (ref 36.0–46.0)
Hemoglobin: 13.9 g/dL (ref 12.0–15.0)
LYMPHS PCT: 28.6 % (ref 12.0–46.0)
Lymphs Abs: 1.4 10*3/uL (ref 0.7–4.0)
MCHC: 33.5 g/dL (ref 30.0–36.0)
MCV: 89.1 fl (ref 78.0–100.0)
Monocytes Absolute: 0.5 10*3/uL (ref 0.1–1.0)
Monocytes Relative: 10.3 % (ref 3.0–12.0)
Neutro Abs: 2.9 10*3/uL (ref 1.4–7.7)
Neutrophils Relative %: 58.5 % (ref 43.0–77.0)
Platelets: 126 10*3/uL — ABNORMAL LOW (ref 150.0–400.0)
RBC: 4.65 Mil/uL (ref 3.87–5.11)
RDW: 12.8 % (ref 11.5–15.5)
WBC: 5 10*3/uL (ref 4.0–10.5)

## 2018-04-19 LAB — COMPREHENSIVE METABOLIC PANEL
ALT: 21 U/L (ref 0–35)
AST: 26 U/L (ref 0–37)
Albumin: 4 g/dL (ref 3.5–5.2)
Alkaline Phosphatase: 76 U/L (ref 39–117)
BILIRUBIN TOTAL: 0.5 mg/dL (ref 0.2–1.2)
BUN: 11 mg/dL (ref 6–23)
CO2: 29 meq/L (ref 19–32)
Calcium: 9.7 mg/dL (ref 8.4–10.5)
Chloride: 102 mEq/L (ref 96–112)
Creatinine, Ser: 0.96 mg/dL (ref 0.40–1.20)
GFR: 61.39 mL/min (ref >60.00)
GLUCOSE: 112 mg/dL — AB (ref 70–99)
Potassium: 4.4 mEq/L (ref 3.5–5.1)
SODIUM: 138 meq/L (ref 135–145)
Total Protein: 6.7 g/dL (ref 6.0–8.3)

## 2018-04-19 LAB — LIPID PANEL
CHOL/HDL RATIO: 4
Cholesterol: 177 mg/dL (ref 0–200)
HDL: 46.9 mg/dL (ref >39.00)
LDL Cholesterol: 100 mg/dL — ABNORMAL HIGH (ref 0–99)
NonHDL: 130.28
TRIGLYCERIDES: 149 mg/dL (ref 0.0–149.0)
VLDL: 29.8 mg/dL (ref 0.0–40.0)

## 2018-04-19 LAB — TSH: TSH: 0.4 u[IU]/mL (ref 0.35–4.50)

## 2018-04-19 MED ORDER — TRAMADOL HCL 50 MG PO TABS: 50.0000 mg | ORAL_TABLET | Freq: Four times a day (QID) | ORAL | 0 refills | Status: DC | PRN

## 2018-04-19 MED ORDER — SERTRALINE HCL 50 MG PO TABS: 50.0000 mg | ORAL_TABLET | Freq: Every day | ORAL | 3 refills | Status: DC

## 2018-04-19 NOTE — Patient Instructions (Signed)
Preventive Care 68 Years and Older, Female Preventive care refers to lifestyle choices and visits with your health care provider that can promote health and wellness. What does preventive care include?  A yearly physical exam. This is also called an annual well check.  Dental exams once or twice a year.  Routine eye exams. Ask your health care provider how often you should have your eyes checked.  Personal lifestyle choices, including: ? Daily care of your teeth and gums. ? Regular physical activity. ? Eating a healthy diet. ? Avoiding tobacco and drug use. ? Limiting alcohol use. ? Practicing safe sex. ? Taking low-dose aspirin every day. ? Taking vitamin and mineral supplements as recommended by your health care provider. What happens during an annual well check? The services and screenings done by your health care provider during your annual well check will depend on your age, overall health, lifestyle risk factors, and family history of disease. Counseling Your health care provider may ask you questions about your:  Alcohol use.  Tobacco use.  Drug use.  Emotional well-being.  Home and relationship well-being.  Sexual activity.  Eating habits.  History of falls.  Memory and ability to understand (cognition).  Work and work environment.  Reproductive health.  Screening You may have the following tests or measurements:  Height, weight, and BMI.  Blood pressure.  Lipid and cholesterol levels. These may be checked every 5 years, or more frequently if you are over 50 years old.  Skin check.  Lung cancer screening. You may have this screening every year starting at age 55 if you have a 30-pack-year history of smoking and currently smoke or have quit within the past 15 years.  Fecal occult blood test (FOBT) of the stool. You may have this test every year starting at age 50.  Flexible sigmoidoscopy or colonoscopy. You may have a sigmoidoscopy every 5 years or  a colonoscopy every 10 years starting at age 50.  Hepatitis C blood test.  Hepatitis B blood test.  Sexually transmitted disease (STD) testing.  Diabetes screening. This is done by checking your blood sugar (glucose) after you have not eaten for a while (fasting). You may have this done every 1-3 years.  Bone density scan. This is done to screen for osteoporosis. You may have this done starting at age 65.  Mammogram. This may be done every 1-2 years. Talk to your health care provider about how often you should have regular mammograms.  Talk with your health care provider about your test results, treatment options, and if necessary, the need for more tests. Vaccines Your health care provider may recommend certain vaccines, such as:  Influenza vaccine. This is recommended every year.  Tetanus, diphtheria, and acellular pertussis (Tdap, Td) vaccine. You may need a Td booster every 10 years.  Varicella vaccine. You may need this if you have not been vaccinated.  Zoster vaccine. You may need this after age 60.  Measles, mumps, and rubella (MMR) vaccine. You may need at least one dose of MMR if you were born in 1957 or later. You may also need a second dose.  Pneumococcal 13-valent conjugate (PCV13) vaccine. One dose is recommended after age 65.  Pneumococcal polysaccharide (PPSV23) vaccine. One dose is recommended after age 65.  Meningococcal vaccine. You may need this if you have certain conditions.  Hepatitis A vaccine. You may need this if you have certain conditions or if you travel or work in places where you may be exposed to hepatitis   A.  Hepatitis B vaccine. You may need this if you have certain conditions or if you travel or work in places where you may be exposed to hepatitis B.  Haemophilus influenzae type b (Hib) vaccine. You may need this if you have certain conditions.  Talk to your health care provider about which screenings and vaccines you need and how often you  need them. This information is not intended to replace advice given to you by your health care provider. Make sure you discuss any questions you have with your health care provider. Document Released: 06/18/2015 Document Revised: 02/09/2016 Document Reviewed: 03/23/2015 Elsevier Interactive Patient Education  2018 [employer]  

## 2018-04-19 NOTE — Assessment & Plan Note (Signed)
Tolerating statin, encouraged heart healthy diet, avoid trans fats, minimize simple carbs and saturated fats. Increase exercise as tolerated 

## 2018-04-19 NOTE — Progress Notes (Signed)
Subjective:     Cassandra Rios is a 67 y.o. female and is here for a comprehensive physical exam. The patient reports problems - fell in July and broke her foot-- she was admitted for pain control.  Pt out of boot -- still has pain -- sees Dr Veverly Fells.  She was in rehab for 13 days ---still does her exercises at home.      Social History   Socioeconomic History  . Marital status: Married    Spouse name: Legrand Como  . Number of children: 2  . Years of education: 47  . Highest education level: Not on file  Occupational History  . Occupation: retired  Scientific laboratory technician  . Financial resource strain: Not on file  . Food insecurity:    Worry: Not on file    Inability: Not on file  . Transportation needs:    Medical: Not on file    Non-medical: Not on file  Tobacco Use  . Smoking status: Former Smoker    Packs/day: 2.00    Years: 35.00    Pack years: 70.00    Types: Cigarettes    Last attempt to quit: 04/05/2007    Years since quitting: 11.0  . Smokeless tobacco: Never Used  Substance and Sexual Activity  . Alcohol use: No  . Drug use: No  . Sexual activity: Never    Partners: Male  Lifestyle  . Physical activity:    Days per week: 0 days    Minutes per session: 0 min  . Stress: Not at all  Relationships  . Social connections:    Talks on phone: Not on file    Gets together: Not on file    Attends religious service: Not on file    Active member of club or organization: Not on file    Attends meetings of clubs or organizations: Not on file    Relationship status: Not on file  . Intimate partner violence:    Fear of current or ex partner: Not on file    Emotionally abused: Not on file    Physically abused: Not on file    Forced sexual activity: Not on file  Other Topics Concern  . Not on file  Social History Narrative   Patient is right handed,reside in home with husband   Health Maintenance  Topic Date Due  . PNA vac Low Risk Adult (2 of 2 - PPSV23) 03/31/2016  .  INFLUENZA VACCINE  01/03/2018  . MAMMOGRAM  05/31/2018  . COLONOSCOPY  05/19/2019  . TETANUS/TDAP  03/06/2021  . DEXA SCAN  Completed  . Hepatitis C Screening  Completed    The following portions of the patient's history were reviewed and updated as appropriate:  She  has a past medical history of AAA (abdominal aortic aneurysm) ([place]), Allergic rhinitis, Chronic hoarseness, Colon polyps, COPD (chronic obstructive pulmonary disease) ([place]), Dyslipidemia, Hypertension, Hypothyroidism, MS (multiple sclerosis) ([place]), Obesity, and Stomach ulcer from aspirin/ibuprofen-like drugs (NSAID's). She does not have any pertinent problems on file. She  has a past surgical history that includes Cholecystectomy; Carpal tunnel release; Bariatric Surgery (09/07/08); Hiatal hernia repair (09/07/08); Abdominal hysterectomy; Cholecystectomy; Cataract extraction, bilateral; Cataract extraction (Right, 11/08/2017); and Cataract extraction (Left, 11/22/2017). Her family history includes AAA (abdominal aortic aneurysm) in her father and sister; Arthritis in her sister; COPD in her father and sister; Dementia in her sister; Heart disease in her father and mother; Other in her sister; Stroke (age of onset: 27) in her mother. She  reports that she quit smoking about 11 years ago. Her smoking use included cigarettes. She has a 70.00 pack-year smoking history. She has never used smokeless tobacco. She reports that she does not drink alcohol or use drugs. She has a current medication list which includes the following prescription(s): atorvastatin, gabapentin, hydrocodone-acetaminophen, interferon beta-1b, levothyroxine, mometasone, telmisartan-hydrochlorothiazide, tramadol, triamcinolone ointment, and sertraline. Current Outpatient Medications on File Prior to Visit  Medication Sig Dispense Refill  . atorvastatin (LIPITOR) 20 MG tablet TAKE 1 TABLET BY MOUTH EVERY DAY 90 tablet 1  . gabapentin (NEURONTIN) 100 MG capsule 300 mg every  evening.    Marland Kitchen HYDROcodone-acetaminophen (NORCO/VICODIN) 5-325 MG per tablet Take 1 tablet by mouth every 6 (six) hours as needed for moderate pain.    . Interferon Beta-1b (BETASERON) 0.3 MG KIT injection RECONSTITUTE AS DIRECTED AND INJECT 0.'25MG'$  (= 1ML) SUBCUTANEOUSLY ONCE EVERY OTHER DAY AS DIRECTED. 42 each 3  . levothyroxine (SYNTHROID, LEVOTHROID) 125 MCG tablet TAKE 1 TABLET BY MOUTH EVERY DAY 90 tablet 1  . mometasone (NASONEX) 50 MCG/ACT nasal spray Place 2 sprays into the nose daily as needed. 16 g 5  . telmisartan-hydrochlorothiazide (MICARDIS HCT) 40-12.5 MG tablet Take 1 tablet by mouth daily. 90 tablet 1  . triamcinolone ointment (KENALOG) 0.1 % APPLY TO AFFECTED AREA MONDAYS, WEDNESDAYS AND FRIDAYS  5   No current facility-administered medications on file prior to visit.    She is allergic to codeine; hydromorphone hcl; ibuprofen; morphine sulfate; and metronidazole..  Review of Systems Review of Systems  Constitutional: Negative for activity change, appetite change and fatigue.  HENT: Negative for hearing loss, congestion, tinnitus and ear discharge.  dentist q30mEyes: Negative for visual disturbance (see optho q1y -- vision corrected to 20/20 with glasses).  Respiratory: Negative for cough, chest tightness and shortness of breath.   Cardiovascular: Negative for chest pain, palpitations and leg swelling.  Gastrointestinal: Negative for abdominal pain, diarrhea, constipation and abdominal distention.  Genitourinary: Negative for urgency, frequency, decreased urine volume and difficulty urinating.  Musculoskeletal: Negative for back pain, arthralgias and gait problem.  Skin: Negative for color change, pallor and rash.  Neurological: Negative for dizziness, light-headedness, numbness and headaches.  Hematological: Negative for adenopathy. Does not bruise/bleed easily.  Psychiatric/Behavioral: Negative for suicidal ideas, confusion, sleep disturbance, self-injury, dysphoric mood,  decreased concentration and agitation.       Objective:    BP (!) 143/71 (BP Location: Right Arm, Cuff Size: Large)   Pulse 61   Temp 97.7 F (36.5 C) (Oral)   Resp 16   Ht '5\' 6"'$  (1.676 m)   Wt 245 lb 6.4 oz (111.3 kg)   SpO2 98%   BMI 39.61 kg/m  General appearance: alert, cooperative, appears stated age and no distress Head: Normocephalic, without obvious abnormality, atraumatic Eyes: conjunctivae/corneas clear. PERRL, EOM's intact. Fundi benign. Ears: normal TM's and external ear canals both ears Nose: Nares normal. Septum midline. Mucosa normal. No drainage or sinus tenderness. Throat: lips, mucosa, and tongue normal; teeth and gums normal Neck: no adenopathy, no carotid bruit, no JVD, supple, symmetrical, trachea midline and thyroid not enlarged, symmetric, no tenderness/mass/nodules Back: symmetric, no curvature. ROM normal. No CVA tenderness. Lungs: clear to auscultation bilaterally Breasts: normal appearance, no masses or tenderness Heart: regular rate and rhythm, S1, S2 normal, no murmur, click, rub or gallop Abdomen: soft, non-tender; bowel sounds normal; no masses,  no organomegaly Pelvic: not indicated; status post hysterectomy, negative ROS Extremities: extremities normal, atraumatic, no cyanosis or edema Pulses: 2+  and symmetric Skin: Skin color, texture, turgor normal. No rashes or lesions Lymph nodes: Cervical, supraclavicular, and axillary nodes normal. Neurologic: Alert and oriented X 3, normal strength and tone. Normal symmetric reflexes. Normal coordination and gait    Assessment:    Healthy female exam.      Plan:    ghm utd Check labs See After Visit Summary for Counseling Recommendations    1. Hypothyroidism, unspecified type con't synthroid Check labs  - TSH  2. Hyperlipidemia, unspecified hyperlipidemia type Tolerating statin, encouraged heart healthy diet, avoid trans fats, minimize simple carbs and saturated fats. Increase exercise as  tolerated - Lipid panel - Comprehensive metabolic panel  3. Essential hypertension Well controlled, no changes to meds. Encouraged heart healthy diet such as the DASH diet and exercise as tolerated.  - CBC with Differential/Platelet - Lipid panel - Comprehensive metabolic panel - TSH  4. Preventative health care See above   5. MULTIPLE SCLEROSIS, RELAPSING/REMITTING Per neuro  6. COPD with acute bronchitis ([place]) Per pulm  7. History of tobacco abuse  - Ambulatory Referral for Lung Cancer Scre  8. Depression with anxiety Stopped prozac rto 1 months - sertraline (ZOLOFT) 50 MG tablet; Take 1 tablet (50 mg total) by mouth daily.  Dispense: 30 tablet; Refill: 3  9. Abdominal aortic aneurysm (AAA) without rupture ([place]) Per Dr Donnetta Hutching  9. Senile purpura (HCC) Resolved--- stopped fish oil and aspirin   11. Severe obesity (BMI >= 40) (HCC) S/p lab band  Has gained some weight Not able to f/u surgeon because medicare will not cover it  12. Hyperlipidemia LDL goal <100 Tolerating statin, encouraged heart healthy diet, avoid trans fats, minimize simple carbs and saturated fats. Increase exercise as tolerated  13. Chronic nonintractable headache, unspecified headache type Stable  - traMADol (ULTRAM) 50 MG tablet; Take 1 tablet (50 mg total) by mouth every 6 (six) hours as needed.  Dispense: 30 tablet; Refill: 0

## 2018-04-19 NOTE — Assessment & Plan Note (Signed)
stable °

## 2018-04-19 NOTE — Assessment & Plan Note (Signed)
Stable con't meds 

## 2018-04-19 NOTE — Assessment & Plan Note (Signed)
Per Dr Donnetta Hutching

## 2018-04-19 NOTE — Assessment & Plan Note (Signed)
Discussed diet and exercise  Pt has not been able to exercise due to foot

## 2018-04-19 NOTE — Assessment & Plan Note (Signed)
Per neuro 

## 2018-04-19 NOTE — Assessment & Plan Note (Addendum)
Off meds due to reflux  Start zoloft

## 2018-04-22 LAB — PAIN MGMT, PROFILE 8 W/CONF, U
6 ACETYLMORPHINE: NEGATIVE ng/mL (ref <10)
Alcohol Metabolites: NEGATIVE ng/mL (ref <500)
Amphetamines: NEGATIVE ng/mL (ref <500)
Benzodiazepines: NEGATIVE ng/mL (ref <100)
Buprenorphine, Urine: NEGATIVE ng/mL (ref <5)
CREATININE: 105.1 mg/dL
Cocaine Metabolite: NEGATIVE ng/mL (ref <150)
MARIJUANA METABOLITE: NEGATIVE ng/mL (ref <20)
MDMA: NEGATIVE ng/mL (ref <500)
OPIATES: NEGATIVE ng/mL (ref <100)
Oxidant: NEGATIVE ug/mL (ref <200)
Oxycodone: NEGATIVE ng/mL (ref <100)
PH: 6.95 (ref 4.5–9.0)

## 2018-04-22 LAB — PAIN MGMT, TRAMADOL W/MEDMATCH, U
Desmethyltramadol: 488 ng/mL — ABNORMAL HIGH (ref <100)
Tramadol: 6738 ng/mL — ABNORMAL HIGH (ref <100)

## 2018-04-24 ENCOUNTER — Other Ambulatory Visit: Payer: Self-pay | Admitting: Acute Care

## 2018-04-24 ENCOUNTER — Telehealth: Payer: Self-pay | Admitting: Family Medicine

## 2018-04-24 DIAGNOSIS — Z87891 Personal history of nicotine dependence: Secondary | ICD-10-CM

## 2018-04-24 DIAGNOSIS — Z122 Encounter for screening for malignant neoplasm of respiratory organs: Secondary | ICD-10-CM

## 2018-04-24 NOTE — Telephone Encounter (Signed)
Copied from [place] [id number]. Topic: Quick Communication - Lab Results (Clinic Use ONLY) >> Apr 24, 2018 11:26 AM Doylene Canning, CMA wrote: Called patient to inform them of 04/19/18 lab results. When patient returns call, triage nurse may disclose results.

## 2018-04-25 NOTE — Telephone Encounter (Signed)
Contacted pt.  See result note.

## 2018-04-26 ENCOUNTER — Other Ambulatory Visit: Payer: Self-pay | Admitting: Family Medicine

## 2018-04-26 DIAGNOSIS — Z1231 Encounter for screening mammogram for malignant neoplasm of breast: Secondary | ICD-10-CM

## 2018-05-01 ENCOUNTER — Other Ambulatory Visit: Payer: Self-pay | Admitting: Family Medicine

## 2018-05-06 NOTE — Telephone Encounter (Signed)
Patient calling regarding patient assistance forms for Interferon Beta-1b (BETASERON) 0.3 MG KIT injection she has to bring to office.

## 2018-05-06 NOTE — Telephone Encounter (Signed)
Ok to come by and bring p/w with her to drop off to me.  She will ask for me and that is ok.

## 2018-05-08 ENCOUNTER — Telehealth: Payer: Self-pay | Admitting: *Deleted

## 2018-05-08 DIAGNOSIS — G35 Multiple sclerosis: Secondary | ICD-10-CM

## 2018-05-08 MED ORDER — INTERFERON BETA-1B 0.3 MG ~~LOC~~ KIT: PACK | SUBCUTANEOUS | 3 refills | Status: DC

## 2018-05-08 NOTE — Telephone Encounter (Signed)
Signed.

## 2018-05-08 NOTE — Telephone Encounter (Signed)
Pt brought form, filled out. Sent to MM/NP for review and singature.

## 2018-05-08 NOTE — Telephone Encounter (Signed)
LMVM for pt to that will place up front the information for her betaseron PAP w/ prescription.

## 2018-05-13 ENCOUNTER — Encounter: Payer: Self-pay | Admitting: Acute Care

## 2018-05-13 ENCOUNTER — Ambulatory Visit (INDEPENDENT_AMBULATORY_CARE_PROVIDER_SITE_OTHER): Payer: PPO | Admitting: Acute Care

## 2018-05-13 ENCOUNTER — Ambulatory Visit (INDEPENDENT_AMBULATORY_CARE_PROVIDER_SITE_OTHER)
Admission: RE | Admit: 2018-05-13 | Discharge: 2018-05-13 | Disposition: A | Discharge status: Home / Self Care | Payer: PPO | Source: Ambulatory Visit | Attending: Acute Care | Admitting: Acute Care

## 2018-05-13 ENCOUNTER — Telehealth: Payer: Self-pay | Admitting: Acute Care

## 2018-05-13 VITALS — BP 118/70 | HR 100 | Ht 66.0 in | Wt 246.6 lb

## 2018-05-13 DIAGNOSIS — Z87891 Personal history of nicotine dependence: Secondary | ICD-10-CM

## 2018-05-13 DIAGNOSIS — Z122 Encounter for screening for malignant neoplasm of respiratory organs: Secondary | ICD-10-CM

## 2018-05-13 NOTE — Telephone Encounter (Signed)
Cassandra Gauss, [name] would prefer normal results sent in the mail, call only if abnormal. Thanks so much

## 2018-05-13 NOTE — Progress Notes (Signed)
Shared Decision Making Visit Lung Cancer Screening Program [phone])   Eligibility:  Age 67 y.o.  Pack Years Smoking History Calculation 80 pack year smoking history (# packs/per year x # years smoked)  Recent History of coughing up blood  no  Unexplained weight loss? no ( >Than 15 pounds within the last 6 months )  Prior History Lung / other cancer no (Diagnosis within the last 5 years already requiring surveillance chest CT Scans).  Smoking Status Former Smoker  Former Smokers: Years since quit: 11 years  Quit Date: 04/05/2007  Visit Components:  Discussion included one or more decision making aids. yes  Discussion included risk/benefits of screening. yes  Discussion included potential follow up diagnostic testing for abnormal scans. yes  Discussion included meaning and risk of over diagnosis. yes  Discussion included meaning and risk of False Positives. yes  Discussion included meaning of total radiation exposure. yes  Counseling Included:  Importance of adherence to annual lung cancer LDCT screening. yes  Impact of comorbidities on ability to participate in the program. yes  Ability and willingness to under diagnostic treatment. yes  Smoking Cessation Counseling:  Current Smokers:   Discussed importance of smoking cessation. NA  Information about tobacco cessation classes and interventions provided to patient. yes  Patient provided with "ticket" for LDCT Scan. yes  Symptomatic Patient. no  Counseling  Diagnosis Code: Tobacco Use Z72.0  Asymptomatic Patient yes  Counseling (Intermediate counseling: > three minutes counseling) W5462  Former Smokers:   Discussed the importance of maintaining cigarette abstinence. yes  Diagnosis Code: Personal History of Nicotine Dependence. V03.500  Information about tobacco cessation classes and interventions provided to patient. Yes  Patient provided with "ticket" for LDCT Scan. yes  Written Order for Lung  Cancer Screening with LDCT placed in Epic. Yes (CT Chest Lung Cancer Screening Low Dose W/O CM) [id number] Z12.2-Screening of respiratory organs Z87.891-Personal history of nicotine dependence   Magdalen Spatz, NP   I spent 25 minutes of face to face time with Ms. Cassandra Rios discussing the risks and benefits of lung cancer screening. We viewed a power point together that explained in detail the above noted topics. We took the time to pause the power point at intervals to allow for questions to be asked and answered to ensure understanding. We discussed that she had taken the single most powerful action possible to decrease her risk of developing lung cancer when she quit smoking. I counseled her to remain smoke free, and to contact me if she ever had the desire to smoke again so that I can provide resources and tools to help support the effort to remain smoke free. We discussed the time and location of the scan, and that either  Doroteo Glassman RN or I will call with the results within  24-48 hours of receiving them. She has my card and contact information in the event she needs to speak with me, in addition to a copy of the power point we reviewed as a resource. She verbalized understanding of all of the above and had no further questions upon leaving the office.     I explained to the patient that there has been a high incidence of coronary artery disease noted on these exams. I explained that this is a non-gated exam therefore degree or severity cannot be determined. This patient is currently on statin therapy. I have asked the patient to follow-up with their PCP regarding any incidental finding of coronary artery disease and management with diet  or medication as they feel is clinically indicated. The patient verbalized understanding of the above and had no further questions.  05/13/2018 4:17 PM

## 2018-05-14 NOTE — Telephone Encounter (Signed)
Noted.  Will hold this message in my box until pt's results are back and mailed to pt.

## 2018-05-15 ENCOUNTER — Other Ambulatory Visit: Payer: Self-pay | Admitting: Family Medicine

## 2018-05-15 DIAGNOSIS — E039 Hypothyroidism, unspecified: Secondary | ICD-10-CM

## 2018-05-16 ENCOUNTER — Encounter: Payer: Self-pay | Admitting: *Deleted

## 2018-05-16 NOTE — Telephone Encounter (Signed)
Per pt request, letter mailed to pt with CT results.  Order placed for 1 yr f/u low dose CT.  Results faxed to PCP.

## 2018-05-16 NOTE — Addendum Note (Signed)
Addended by: Doroteo Glassman D on: 05/16/2018 08:24 AM   Modules accepted: Orders

## 2018-05-17 ENCOUNTER — Other Ambulatory Visit: Payer: Self-pay

## 2018-05-17 ENCOUNTER — Other Ambulatory Visit: Payer: Self-pay | Admitting: Family Medicine

## 2018-05-17 DIAGNOSIS — I714 Abdominal aortic aneurysm, without rupture, unspecified: Secondary | ICD-10-CM

## 2018-05-17 DIAGNOSIS — F418 Other specified anxiety disorders: Secondary | ICD-10-CM

## 2018-06-04 ENCOUNTER — Ambulatory Visit
Admission: RE | Admit: 2018-06-04 | Discharge: 2018-06-04 | Disposition: A | Discharge status: Home / Self Care | Payer: PPO | Source: Ambulatory Visit | Attending: Family Medicine | Admitting: Family Medicine

## 2018-06-04 DIAGNOSIS — Z1231 Encounter for screening mammogram for malignant neoplasm of breast: Secondary | ICD-10-CM | POA: Diagnosis not present

## 2018-06-06 ENCOUNTER — Other Ambulatory Visit: Payer: Self-pay | Admitting: Family Medicine

## 2018-06-06 DIAGNOSIS — R928 Other abnormal and inconclusive findings on diagnostic imaging of breast: Secondary | ICD-10-CM

## 2018-06-10 ENCOUNTER — Telehealth: Payer: Self-pay | Admitting: *Deleted

## 2018-06-10 NOTE — Telephone Encounter (Signed)
Received Physician Orders from [place]; forwarded to provider/SLS 01/06

## 2018-06-11 ENCOUNTER — Ambulatory Visit: Payer: PPO | Admitting: Vascular Surgery

## 2018-06-11 ENCOUNTER — Ambulatory Visit (HOSPITAL_COMMUNITY)
Admission: RE | Admit: 2018-06-11 | Discharge: 2018-06-11 | Disposition: A | Discharge status: Home / Self Care | Payer: PPO | Source: Ambulatory Visit | Attending: Vascular Surgery | Admitting: Vascular Surgery

## 2018-06-11 ENCOUNTER — Other Ambulatory Visit: Payer: Self-pay

## 2018-06-11 ENCOUNTER — Encounter: Payer: Self-pay | Admitting: Vascular Surgery

## 2018-06-11 VITALS — BP 116/78 | HR 72 | Resp 18 | Ht 66.0 in | Wt 246.0 lb

## 2018-06-11 DIAGNOSIS — I714 Abdominal aortic aneurysm, without rupture, unspecified: Secondary | ICD-10-CM

## 2018-06-11 NOTE — Progress Notes (Signed)
Vascular and Vein Specialist of [facility]  Patient name: Cassandra Rios MRN: [id number] DOB: July 18, 1950 Sex: female  REASON FOR VISIT: Follow-up of known abdominal aortic aneurysm  HPI: Cassandra Rios is a 68 y.o. female here today for follow-up.  She has no new medical difficulty since my last visit with her.  She did suffer a fall and leg fracture of her fibula which was treated with a soft cast.  She is recovering from this.  She has no symptoms referable to her aneurysm.  Past Medical History:  Diagnosis Date  . AAA (abdominal aortic aneurysm) (HCC)   . Allergic rhinitis   . Chronic hoarseness   . Colon polyps   . COPD (chronic obstructive pulmonary disease) (HCC)    Dr. Marchelle Gearing  . Dyslipidemia   . Hypertension   . Hypothyroidism   . MS (multiple sclerosis) (HCC)   . Obesity   . Stomach ulcer from aspirin/ibuprofen-like drugs (NSAID's)     Family History  Problem Relation Age of Onset  . COPD Father   . Heart disease Father   . AAA (abdominal aortic aneurysm) Father   . COPD Sister   . Arthritis Sister   . AAA (abdominal aortic aneurysm) Sister   . Dementia Sister   . Other Sister        brain injury  . Stroke Mother 79  . Heart disease Mother        congenital heart defect?    SOCIAL HISTORY: Social History   Tobacco Use  . Smoking status: Former Smoker    Packs/day: 2.00    Years: 40.00    Pack years: 80.00    Types: Cigarettes    Last attempt to quit: 04/05/2007    Years since quitting: 11.1  . Smokeless tobacco: Never Used  Substance Use Topics  . Alcohol use: No    Allergies  Allergen Reactions  . Codeine Itching and Nausea And Vomiting  . Hydromorphone Hcl     REACTION: nausea/vomiting  . Ibuprofen Other (See Comments)    REACTION: stomach ulcers  . Morphine Sulfate     "makes my blood pressure bottom out"  . Metronidazole Hives, Nausea And Vomiting and Rash    Current Outpatient  Medications  Medication Sig Dispense Refill  . atorvastatin (LIPITOR) 20 MG tablet TAKE 1 TABLET BY MOUTH EVERY DAY 90 tablet 1  . gabapentin (NEURONTIN) 100 MG capsule 300 mg every evening.    Marland Kitchen HYDROcodone-acetaminophen (NORCO/VICODIN) 5-325 MG per tablet Take 1 tablet by mouth every 6 (six) hours as needed for moderate pain.    . Interferon Beta-1b (BETASERON) 0.3 MG KIT injection RECONSTITUTE AS DIRECTED AND INJECT 0.25MG  (= ) SUBCUTANEOUSLY ONCE EVERY OTHER DAY AS DIRECTED. 42 each 3  . levothyroxine (SYNTHROID, LEVOTHROID) 125 MCG tablet TAKE 1 TABLET BY MOUTH EVERY DAY 90 tablet 1  . mometasone (NASONEX) 50 MCG/ACT nasal spray Place 2 sprays into the nose daily as needed. 16 g 5  . sertraline (ZOLOFT) 50 MG tablet TAKE 1 TABLET BY MOUTH EVERY DAY 90 tablet 1  . telmisartan-hydrochlorothiazide (MICARDIS HCT) 40-12.5 MG tablet Take 1 tablet by mouth daily. 90 tablet 1  . traMADol (ULTRAM) 50 MG tablet Take 1 tablet (50 mg total) by mouth every 6 (six) hours as needed. 30 tablet 0  . triamcinolone ointment (KENALOG) 0.1 % APPLY TO AFFECTED AREA MONDAYS, WEDNESDAYS AND FRIDAYS  5   No current facility-administered medications for this visit.  REVIEW OF SYSTEMS:  [X]  denotes positive finding, [ ]  denotes negative finding Cardiac  Comments:  Chest pain or chest pressure:    Shortness of breath upon exertion:    Short of breath when lying flat:    Irregular heart rhythm:        Vascular    Pain in calf, thigh, or hip brought on by ambulation:    Pain in feet at night that wakes you up from your sleep:     Blood clot in your veins:    Leg swelling:           PHYSICAL EXAM: Vitals:   06/11/18 0843  BP: 116/78  Pulse: 72  Resp: 18  SpO2: 97%  Weight: 246 lb (111.6 kg)  Height: 5\' 6"  (1.676 m)    GENERAL: The patient is a well-nourished female, in no acute distress. The vital signs are documented above. CARDIOVASCULAR: 2+ radial and 2+ dorsalis pedis pulses bilaterally.   Abdomen obese.  No aneurysm palpable PULMONARY: There is good air exchange  MUSCULOSKELETAL: There are no major deformities or cyanosis. NEUROLOGIC: No focal weakness or paresthesias are detected. SKIN: There are no ulcers or rashes noted. PSYCHIATRIC: The patient has a normal affect.  DATA:  Ultrasound today shows no change from her prior CT scan.  Maximal diameter of her aorta is 3.9 cm.  MEDICAL ISSUES: Stable size of small abdominal aortic aneurysm.  Again reviewed symptoms of leaking aneurysm with her.  We will see her again in 2 months with repeat ultrasound follow-up    Larina Earthly, MD [facility] Specialists of [facility] Tel [phone] Pager [phone]

## 2018-06-12 ENCOUNTER — Ambulatory Visit
Admission: RE | Admit: 2018-06-12 | Discharge: 2018-06-12 | Disposition: A | Discharge status: Home / Self Care | Payer: PPO | Source: Ambulatory Visit | Attending: Family Medicine | Admitting: Family Medicine

## 2018-06-12 ENCOUNTER — Other Ambulatory Visit: Payer: Self-pay | Admitting: Family Medicine

## 2018-06-12 DIAGNOSIS — R928 Other abnormal and inconclusive findings on diagnostic imaging of breast: Secondary | ICD-10-CM

## 2018-06-28 ENCOUNTER — Other Ambulatory Visit: Payer: Self-pay | Admitting: Family Medicine

## 2018-06-28 DIAGNOSIS — I10 Essential (primary) hypertension: Secondary | ICD-10-CM

## 2018-08-23 ENCOUNTER — Telehealth: Payer: Self-pay | Admitting: *Deleted

## 2018-08-23 NOTE — Telephone Encounter (Signed)
Received fax from [employer]: patient is eligible to receive Betaseron (Interferon beta-1b) at no cost for enrollment period 08/22/18 to 06/05/2019.

## 2018-08-26 NOTE — Telephone Encounter (Signed)
CASE # [id number].

## 2018-10-01 DIAGNOSIS — L900 Lichen sclerosus et atrophicus: Secondary | ICD-10-CM | POA: Diagnosis not present

## 2018-10-07 ENCOUNTER — Other Ambulatory Visit: Payer: Self-pay

## 2018-10-18 ENCOUNTER — Other Ambulatory Visit: Payer: PPO

## 2018-10-18 ENCOUNTER — Telehealth: Payer: Self-pay | Admitting: Family Medicine

## 2018-10-18 ENCOUNTER — Ambulatory Visit (INDEPENDENT_AMBULATORY_CARE_PROVIDER_SITE_OTHER): Payer: PPO | Admitting: Family Medicine

## 2018-10-18 ENCOUNTER — Encounter: Payer: Self-pay | Admitting: Family Medicine

## 2018-10-18 ENCOUNTER — Encounter: Payer: Self-pay | Admitting: *Deleted

## 2018-10-18 ENCOUNTER — Other Ambulatory Visit: Payer: Self-pay

## 2018-10-18 DIAGNOSIS — R51 Headache: Secondary | ICD-10-CM | POA: Diagnosis not present

## 2018-10-18 DIAGNOSIS — F418 Other specified anxiety disorders: Secondary | ICD-10-CM

## 2018-10-18 DIAGNOSIS — E039 Hypothyroidism, unspecified: Secondary | ICD-10-CM

## 2018-10-18 DIAGNOSIS — D691 Qualitative platelet defects: Secondary | ICD-10-CM

## 2018-10-18 DIAGNOSIS — R519 Headache, unspecified: Secondary | ICD-10-CM

## 2018-10-18 DIAGNOSIS — Z79899 Other long term (current) drug therapy: Secondary | ICD-10-CM

## 2018-10-18 DIAGNOSIS — E785 Hyperlipidemia, unspecified: Secondary | ICD-10-CM

## 2018-10-18 DIAGNOSIS — I10 Essential (primary) hypertension: Secondary | ICD-10-CM | POA: Diagnosis not present

## 2018-10-18 NOTE — Assessment & Plan Note (Signed)
con't zoloft  stable

## 2018-10-18 NOTE — Progress Notes (Signed)
Virtual Visit via Telephone Note  I connected with Cassandra Rios on 10/18/18 at  8:15 AM EDT by telephone and verified that I am speaking with the correct person using two identifiers.  Location: Patient: home  Provider: office   I discussed the limitations, risks, security and privacy concerns of performing an evaluation and management service by telephone and the availability of in person appointments. I also discussed with the patient that there may be a patient responsible charge related to this service. The patient expressed understanding and agreed to proceed.   History of Present Illness: Pt is home with no complaints She needs f/u bp but has been unable to check her bp at home She also needs labs for cholesterol and uds and contract update  Past Medical History:  Diagnosis Date  . AAA (abdominal aortic aneurysm) ([place])   . Allergic rhinitis   . Chronic hoarseness   . Colon polyps   . COPD (chronic obstructive pulmonary disease) (HCC)    Dr. Chase Caller  . Dyslipidemia   . Hypertension   . Hypothyroidism   . MS (multiple sclerosis) ([place])   . Obesity   . Stomach ulcer from aspirin/ibuprofen-like drugs (NSAID's)    Current Outpatient Medications on File Prior to Visit  Medication Sig Dispense Refill  . atorvastatin (LIPITOR) 20 MG tablet TAKE 1 TABLET BY MOUTH EVERY DAY 90 tablet 1  . gabapentin (NEURONTIN) 300 MG capsule Take 2 capsules by mouth daily.    Marland Kitchen HYDROcodone-acetaminophen (NORCO/VICODIN) 5-325 MG per tablet Take 1 tablet by mouth every 6 (six) hours as needed for moderate pain.    . Interferon Beta-1b (BETASERON) 0.3 MG KIT injection RECONSTITUTE AS DIRECTED AND INJECT 0.25MG (= 1ML) SUBCUTANEOUSLY ONCE EVERY OTHER DAY AS DIRECTED. 42 each 3  . levothyroxine (SYNTHROID, LEVOTHROID) 125 MCG tablet TAKE 1 TABLET BY MOUTH EVERY DAY 90 tablet 1  . mometasone (NASONEX) 50 MCG/ACT nasal spray Place 2 sprays into the nose daily as needed. 16 g 5  . sertraline  (ZOLOFT) 50 MG tablet TAKE 1 TABLET BY MOUTH EVERY DAY 90 tablet 1  . telmisartan-hydrochlorothiazide (MICARDIS HCT) 40-12.5 MG tablet TAKE 1 TABLET BY MOUTH EVERY DAY 180 tablet 1  . traMADol (ULTRAM) 50 MG tablet Take 1 tablet (50 mg total) by mouth every 6 (six) hours as needed. 30 tablet 0  . triamcinolone ointment (KENALOG) 0.1 % APPLY TO AFFECTED AREA MONDAYS, WEDNESDAYS AND FRIDAYS  5   No current facility-administered medications on file prior to visit.      Observations/Objective: Wt 250  Ht 5 ft 6in    Afebrile  Pt in NAD  Pt with covid symptoms   Assessment and Plan: 1. Hypothyroidism, unspecified type Check labs  - TSH; Future  2. Abnormal platelets (HCC) Check labs  - CBC with Differential/Platelet; Future  3. Essential hypertension Well controlled, no changes to meds.--- per pt--we will check it when she comes in for labs next week Encouraged heart healthy diet such as the DASH diet and exercise as tolerated.  - Lipid panel; Future - Comprehensive metabolic panel; Future  4. Hyperlipidemia LDL goal <100 Tolerating statin, encouraged heart healthy diet, avoid trans fats, minimize simple carbs and saturated fats. Increase exercise as tolerated  5. Depression with anxiety Stable con't meds   Follow Up Instructions:    I discussed the assessment and treatment plan with the patient. The patient was provided an opportunity to ask questions and all were answered. The patient agreed with the  plan and demonstrated an understanding of the instructions.   The patient was advised to call back or seek an in-person evaluation if the symptoms worsen or if the condition fails to improve as anticipated.  I provided 25 minutes of non-face-to-face time during this encounter.   Ann Held, DO

## 2018-10-18 NOTE — Assessment & Plan Note (Signed)
Tolerating statin, encouraged heart healthy diet, avoid trans fats, minimize simple carbs and saturated fats. Increase exercise as tolerated 

## 2018-10-18 NOTE — Assessment & Plan Note (Signed)
Well controlled, no changes to meds. Encouraged heart healthy diet such as the DASH diet and exercise as tolerated.  °

## 2018-10-18 NOTE — Assessment & Plan Note (Signed)
Check Tsh

## 2018-10-18 NOTE — Telephone Encounter (Signed)
LVM for pt to schedule 6 month fu appt with Provider. Pt already has lab and nurse visit on 10-22-2018.

## 2018-10-22 ENCOUNTER — Other Ambulatory Visit (INDEPENDENT_AMBULATORY_CARE_PROVIDER_SITE_OTHER): Payer: PPO

## 2018-10-22 ENCOUNTER — Other Ambulatory Visit: Payer: Self-pay | Admitting: Family Medicine

## 2018-10-22 ENCOUNTER — Ambulatory Visit (INDEPENDENT_AMBULATORY_CARE_PROVIDER_SITE_OTHER): Payer: PPO | Admitting: Internal Medicine

## 2018-10-22 ENCOUNTER — Other Ambulatory Visit: Payer: Self-pay

## 2018-10-22 DIAGNOSIS — E039 Hypothyroidism, unspecified: Secondary | ICD-10-CM

## 2018-10-22 DIAGNOSIS — I10 Essential (primary) hypertension: Secondary | ICD-10-CM

## 2018-10-22 DIAGNOSIS — D691 Qualitative platelet defects: Secondary | ICD-10-CM | POA: Diagnosis not present

## 2018-10-22 DIAGNOSIS — E785 Hyperlipidemia, unspecified: Secondary | ICD-10-CM

## 2018-10-22 LAB — COMPREHENSIVE METABOLIC PANEL
ALT: 15 U/L (ref 0–35)
AST: 18 U/L (ref 0–37)
Albumin: 3.8 g/dL (ref 3.5–5.2)
Alkaline Phosphatase: 72 U/L (ref 39–117)
BUN: 18 mg/dL (ref 6–23)
CO2: 30 mEq/L (ref 19–32)
Calcium: 9.2 mg/dL (ref 8.4–10.5)
Chloride: 101 mEq/L (ref 96–112)
Creatinine, Ser: 0.97 mg/dL (ref 0.40–1.20)
GFR: 56.99 mL/min — ABNORMAL LOW (ref >60.00)
Glucose, Bld: 98 mg/dL (ref 70–99)
Potassium: 4.3 mEq/L (ref 3.5–5.1)
Sodium: 137 mEq/L (ref 135–145)
Total Bilirubin: 0.5 mg/dL (ref 0.2–1.2)
Total Protein: 6.6 g/dL (ref 6.0–8.3)

## 2018-10-22 LAB — LIPID PANEL
Cholesterol: 175 mg/dL (ref 0–200)
HDL: 42.2 mg/dL (ref >39.00)
LDL Cholesterol: 99 mg/dL (ref 0–99)
NonHDL: 132.37
Total CHOL/HDL Ratio: 4
Triglycerides: 169 mg/dL — ABNORMAL HIGH (ref 0.0–149.0)
VLDL: 33.8 mg/dL (ref 0.0–40.0)

## 2018-10-22 LAB — CBC WITH DIFFERENTIAL/PLATELET
Basophils Absolute: 0 10*3/uL (ref 0.0–0.1)
Basophils Relative: 0.3 % (ref 0.0–3.0)
Eosinophils Absolute: 0.1 10*3/uL (ref 0.0–0.7)
Eosinophils Relative: 2.1 % (ref 0.0–5.0)
HCT: 43.8 % (ref 36.0–46.0)
Hemoglobin: 14.6 g/dL (ref 12.0–15.0)
Lymphocytes Relative: 39 % (ref 12.0–46.0)
Lymphs Abs: 2.5 10*3/uL (ref 0.7–4.0)
MCHC: 33.4 g/dL (ref 30.0–36.0)
MCV: 89.5 fl (ref 78.0–100.0)
Monocytes Absolute: 0.6 10*3/uL (ref 0.1–1.0)
Monocytes Relative: 10 % (ref 3.0–12.0)
Neutro Abs: 3.1 10*3/uL (ref 1.4–7.7)
Neutrophils Relative %: 48.6 % (ref 43.0–77.0)
Platelets: 139 10*3/uL — ABNORMAL LOW (ref 150.0–400.0)
RBC: 4.89 Mil/uL (ref 3.87–5.11)
RDW: 13.3 % (ref 11.5–15.5)
WBC: 6.5 10*3/uL (ref 4.0–10.5)

## 2018-10-22 LAB — TSH: TSH: 0.34 u[IU]/mL — ABNORMAL LOW (ref 0.35–4.50)

## 2018-10-22 MED ORDER — LEVOTHYROXINE SODIUM 112 MCG PO TABS: 112.0000 ug | ORAL_TABLET | Freq: Every day | ORAL | 2 refills | Status: DC

## 2018-10-22 NOTE — Progress Notes (Addendum)
Pt here for Blood pressure check per Dr. Etter Sjogren  Pt currently takes:Micardis HCT 40-12.5 mg, no changes pt just states she needed her blood pressure checked since it has been a while   Pt reports compliance with medication.  BP today @ = 117/76 HR =60  Pt advised per Dr. Larose Kells DOD continue current regimen, and follow up as needed. Patient also signed updated controlled substance contract today.   BP Readings from Last 3 Encounters:  06/11/18 116/78  05/13/18 118/70  04/19/18 (!) 143/71    BP is very good today Kathlene November, MD

## 2018-10-27 ENCOUNTER — Other Ambulatory Visit: Payer: Self-pay | Admitting: Family Medicine

## 2018-11-05 ENCOUNTER — Telehealth: Payer: Self-pay | Admitting: *Deleted

## 2018-11-05 NOTE — Telephone Encounter (Signed)
Called pt. Due to current COVID 19 pandemic, our office is severely reducing in office visits until further notice, in order to minimize the risk to our patients and healthcare providers. Unable to get in contact with the patient to convert their office visit with [facility] on 11/11/2018 into a doxy.me visit. I left a voicemail asking the patient to return my call. Office number was provided.     If patient calls back please convert their office visit into a doxy.me visit.

## 2018-11-06 NOTE — Telephone Encounter (Signed)
I called pt back.  She is doing ok, just weak from inactivity.  I relayed that ok to do telephone visit, so it is set up as such due Due to current COVID 19 pandemic, our office is severely reducing in office visits until further notice, in order to minimize the risk to our patients and healthcare providers. Pt understands that although there may be some limitations with this type of visit, we will take all precautions to reduce any security or privacy concerns.  Pt understands that this will be treated like an in office visit and we will file with pt's insurance, and there may be a patient responsible charge related to this service. Consented to telephone visit.

## 2018-11-06 NOTE — Telephone Encounter (Signed)
Pt has called back and states she would only be able to do a phone call visit.  Please call pt back to inform if that is an option for her.

## 2018-11-11 ENCOUNTER — Ambulatory Visit (INDEPENDENT_AMBULATORY_CARE_PROVIDER_SITE_OTHER): Payer: PPO | Admitting: Adult Health

## 2018-11-11 ENCOUNTER — Encounter: Payer: Self-pay | Admitting: Adult Health

## 2018-11-11 ENCOUNTER — Other Ambulatory Visit: Payer: Self-pay

## 2018-11-11 DIAGNOSIS — G35 Multiple sclerosis: Secondary | ICD-10-CM

## 2018-11-11 NOTE — Progress Notes (Signed)
  [facility] [address] [place]. [place] [address] [phone]     Virtual Visit via Telephone Note  I connected with Cassandra Rios on 11/11/18 at  9:00 AM EDT by telephone located remotely at [facility] and verified that I am speaking with the correct person using two identifiers who reports being located at home.   Visit scheduled by RN She discussed the limitations, risks, security and privacy concerns of performing an evaluation and management service by telephone and the availability of in person appointments. I also discussed with the patient that there may be a patient responsible charge related to this service. The patient expressed understanding and agreed to proceed. See telephone note for consent and additional scheduling information.    History of Present Illness:  Cassandra Rios is a 68 y.o. female who has been followed in this office for multiple sclerosis. She was initially scheduled for face-to-face office follow up visit today time but due to [place], visit rescheduled for non-face-to-face telephone visit with patients consent. Unable to participate in video visit due to lack of access to device with camera.    11/11/18:  Cassandra Rios is an 68 year old female with a history of multiple sclerosis.  She continues on Betaseron.  Reports that she continues to tolerate the medication well.  She recently had blood work all of which was relatively unremarkable.  The patient states that she has not had any significant changes with the bowels or bladder.  No changes in her vision.  She does have that she had cataract surgery last summer and she no longer has to wear glasses.  Denies any new numbness or weakness.  She does report arthritic changes in the hands.  She states that her balance has been slightly off since she broke her leg last year.  She denies any falls.  Reports that she has not been getting out much due to COVID-19.  She  denies any new issues.     Observations/Objective:  Generalized: Well developed, in no acute distress   Neurological examination  Mentation: Alert oriented to time, place, history taking. Follows all commands speech and language fluent  Assessment and Plan:  1.  Multiple sclerosis  The patient overall is doing well.  She will continue on Betaseron.  She recently had blood work that was relatively unremarkable.  We will consider repeating imaging at the next visit.  I have advised that if her symptoms worsen or she develops new symptoms she should let us know.  She will follow-up in 6 months or sooner if needed.  Follow Up Instructions:   Follow-up in 6 months    I discussed the assessment and treatment plan with the patient.  The patient was provided an opportunity to ask questions and all were answered to their satisfaction. The patient agreed with the plan and verbalized an understanding of the instructions.   I provided 15 minutes of non-face-to-face time during this encounter.    Ward Givens, NP-C   [facility] [address] [place], [place] [address]  Phone [phone] Fax [phone]

## 2018-11-11 NOTE — Progress Notes (Signed)
I have read the note, and I agree with the clinical assessment and plan.  Macrae Wiegman K Kynlee Koenigsberg   

## 2018-12-12 ENCOUNTER — Ambulatory Visit
Admission: RE | Admit: 2018-12-12 | Discharge: 2018-12-12 | Disposition: A | Discharge status: Home / Self Care | Payer: PPO | Source: Ambulatory Visit | Attending: Family Medicine | Admitting: Family Medicine

## 2018-12-12 ENCOUNTER — Other Ambulatory Visit: Payer: Self-pay | Admitting: Family Medicine

## 2018-12-12 DIAGNOSIS — R921 Mammographic calcification found on diagnostic imaging of breast: Secondary | ICD-10-CM | POA: Diagnosis not present

## 2018-12-12 DIAGNOSIS — R928 Other abnormal and inconclusive findings on diagnostic imaging of breast: Secondary | ICD-10-CM

## 2018-12-14 ENCOUNTER — Telehealth: Payer: Self-pay | Admitting: Family Medicine

## 2018-12-14 DIAGNOSIS — F418 Other specified anxiety disorders: Secondary | ICD-10-CM

## 2018-12-18 NOTE — Telephone Encounter (Signed)
Pt calling to f/up on request for 90 day supply.

## 2018-12-20 ENCOUNTER — Telehealth: Payer: Self-pay | Admitting: *Deleted

## 2018-12-20 NOTE — Telephone Encounter (Signed)
We can do them all on 7/20 and cancel august - thanks

## 2018-12-20 NOTE — Telephone Encounter (Signed)
Pt has lab appt scheduled on 12/23/18. Future order says TSH. Also has future orders placed for 01/22/19 for Thyroid panel w/TSH, lipid panel and comprehensive metabolic panel. Pt had CMP and Lipid panel done 10/22/18 and wants to know why we are doing them again so soon. Please verify what labs pt needs to have on Monday?

## 2018-12-23 ENCOUNTER — Other Ambulatory Visit (INDEPENDENT_AMBULATORY_CARE_PROVIDER_SITE_OTHER): Payer: PPO

## 2018-12-23 ENCOUNTER — Other Ambulatory Visit: Payer: Self-pay

## 2018-12-23 DIAGNOSIS — E039 Hypothyroidism, unspecified: Secondary | ICD-10-CM | POA: Diagnosis not present

## 2018-12-23 NOTE — Telephone Encounter (Signed)
Notified pt at today's lab visit. She states she did not fast this morning and would prefer to do lipids, cmet at her next visit. She states she usually just has these done every 6 months. Only thyroid testing done today due to recent change of thyroid dose.

## 2018-12-24 LAB — THYROID PANEL WITH TSH
Free Thyroxine Index: 2.9 (ref 1.4–3.8)
T3 Uptake: 27 % (ref 22–35)
T4, Total: 10.7 ug/dL (ref 5.1–11.9)
TSH: 0.83 mIU/L (ref 0.40–4.50)

## 2019-01-06 ENCOUNTER — Telehealth: Payer: Self-pay | Admitting: Family Medicine

## 2019-01-06 NOTE — Telephone Encounter (Signed)
Pt called stating she received letter in the mail stating her thyroid levels were normal. Pt is requesting to know if she should continue to take thyroid medication or not. Please advise.

## 2019-01-06 NOTE — Telephone Encounter (Signed)
Pt notified with clarification regarding continuing medication

## 2019-02-04 ENCOUNTER — Other Ambulatory Visit: Payer: Self-pay | Admitting: Family Medicine

## 2019-04-03 DIAGNOSIS — L900 Lichen sclerosus et atrophicus: Secondary | ICD-10-CM | POA: Diagnosis not present

## 2019-04-17 ENCOUNTER — Telehealth: Payer: Self-pay | Admitting: Acute Care

## 2019-04-17 NOTE — Telephone Encounter (Signed)
Anita, can you contact pt to schedule f/u LDCT? 

## 2019-04-17 NOTE — Telephone Encounter (Signed)
I spoke with Mrs. Daws and she now has the [facility] CT scheduled on 05/15/19 @ 10:30am

## 2019-04-21 ENCOUNTER — Ambulatory Visit (INDEPENDENT_AMBULATORY_CARE_PROVIDER_SITE_OTHER): Payer: PPO | Admitting: Family Medicine

## 2019-04-21 ENCOUNTER — Ambulatory Visit (HOSPITAL_BASED_OUTPATIENT_CLINIC_OR_DEPARTMENT_OTHER)
Admission: RE | Admit: 2019-04-21 | Discharge: 2019-04-21 | Disposition: A | Discharge status: Home / Self Care | Payer: PPO | Source: Ambulatory Visit | Attending: Family Medicine | Admitting: Family Medicine

## 2019-04-21 ENCOUNTER — Other Ambulatory Visit: Payer: Self-pay

## 2019-04-21 ENCOUNTER — Encounter: Payer: Self-pay | Admitting: Family Medicine

## 2019-04-21 VITALS — BP 120/70 | HR 88 | Temp 97.4°F | Resp 18 | Ht 66.0 in | Wt 244.0 lb

## 2019-04-21 DIAGNOSIS — R0600 Dyspnea, unspecified: Secondary | ICD-10-CM

## 2019-04-21 DIAGNOSIS — R0609 Other forms of dyspnea: Secondary | ICD-10-CM | POA: Insufficient documentation

## 2019-04-21 DIAGNOSIS — F418 Other specified anxiety disorders: Secondary | ICD-10-CM

## 2019-04-21 DIAGNOSIS — Z0000 Encounter for general adult medical examination without abnormal findings: Secondary | ICD-10-CM | POA: Insufficient documentation

## 2019-04-21 DIAGNOSIS — R0602 Shortness of breath: Secondary | ICD-10-CM

## 2019-04-21 DIAGNOSIS — Z23 Encounter for immunization: Secondary | ICD-10-CM

## 2019-04-21 DIAGNOSIS — E039 Hypothyroidism, unspecified: Secondary | ICD-10-CM | POA: Diagnosis not present

## 2019-04-21 DIAGNOSIS — E785 Hyperlipidemia, unspecified: Secondary | ICD-10-CM

## 2019-04-21 DIAGNOSIS — G35 Multiple sclerosis: Secondary | ICD-10-CM | POA: Diagnosis not present

## 2019-04-21 LAB — COMPREHENSIVE METABOLIC PANEL
ALT: 16 U/L (ref 0–35)
AST: 22 U/L (ref 0–37)
Albumin: 4.1 g/dL (ref 3.5–5.2)
Alkaline Phosphatase: 74 U/L (ref 39–117)
BUN: 11 mg/dL (ref 6–23)
CO2: 27 mEq/L (ref 19–32)
Calcium: 9.7 mg/dL (ref 8.4–10.5)
Chloride: 98 mEq/L (ref 96–112)
Creatinine, Ser: 0.98 mg/dL (ref 0.40–1.20)
GFR: 56.24 mL/min — ABNORMAL LOW (ref >60.00)
Glucose, Bld: 106 mg/dL — ABNORMAL HIGH (ref 70–99)
Potassium: 4.6 mEq/L (ref 3.5–5.1)
Sodium: 134 mEq/L — ABNORMAL LOW (ref 135–145)
Total Bilirubin: 0.6 mg/dL (ref 0.2–1.2)
Total Protein: 7 g/dL (ref 6.0–8.3)

## 2019-04-21 LAB — LIPID PANEL
Cholesterol: 190 mg/dL (ref 0–200)
HDL: 44.9 mg/dL (ref >39.00)
LDL Cholesterol: 116 mg/dL — ABNORMAL HIGH (ref 0–99)
NonHDL: 144.63
Total CHOL/HDL Ratio: 4
Triglycerides: 141 mg/dL (ref 0.0–149.0)
VLDL: 28.2 mg/dL (ref 0.0–40.0)

## 2019-04-21 LAB — TSH: TSH: 0.82 u[IU]/mL (ref 0.35–4.50)

## 2019-04-21 NOTE — Progress Notes (Signed)
Patient ID: Cassandra Rios, female    DOB: 09/26/1950  Age: 68 y.o. MRN: [id number]    Subjective:  Subjective  HPI Cassandra Rios presents for f/u chol and thyroid.  She has no new complaints.   She needs her handicap form filled out and she is asking about acupuncture / dry needling for her back pain   Pt c/o possible bone spur  Pt is c/o sob and diaphoresis with any exertion     Review of Systems  Constitutional: Negative for appetite change, diaphoresis, fatigue and unexpected weight change.  Eyes: Negative for pain, redness and visual disturbance.  Respiratory: Negative for cough, chest tightness, shortness of breath and wheezing.   Cardiovascular: Negative for chest pain, palpitations and leg swelling.  Endocrine: Negative for cold intolerance, heat intolerance, polydipsia, polyphagia and polyuria.  Genitourinary: Negative for difficulty urinating, dysuria and frequency.  Neurological: Negative for dizziness, light-headedness, numbness and headaches.    History Past Medical History:  Diagnosis Date   AAA (abdominal aortic aneurysm) (HCC)    Allergic rhinitis    Chronic hoarseness    Colon polyps    COPD (chronic obstructive pulmonary disease) (HCC)    Dr. Marchelle Gearing   Dyslipidemia    Hypertension    Hypothyroidism    MS (multiple sclerosis) (HCC)    Obesity    Stomach ulcer from aspirin/ibuprofen-like drugs (NSAID's)     She has a past surgical history that includes Cholecystectomy; Carpal tunnel release; Bariatric Surgery (09/07/08); Hiatal hernia repair (09/07/08); Abdominal hysterectomy; Cholecystectomy; Cataract extraction, bilateral; Cataract extraction (Right, 11/08/2017); and Cataract extraction (Left, 11/22/2017).   Her family history includes AAA (abdominal aortic aneurysm) in her father and sister; Arthritis in her sister; COPD in her father and sister; Dementia in her sister; Heart disease in her father and mother; Other in her sister; Stroke (age of  onset: 44) in her mother.She reports that she quit smoking about 12 years ago. Her smoking use included cigarettes. She has a 80.00 pack-year smoking history. She has never used smokeless tobacco. She reports that she does not drink alcohol or use drugs.  Current Outpatient Medications on File Prior to Visit  Medication Sig Dispense Refill   atorvastatin (LIPITOR) 20 MG tablet TAKE 1 TABLET BY MOUTH EVERY DAY 90 tablet 1   gabapentin (NEURONTIN) 300 MG capsule Take 2 capsules by mouth daily.     HYDROcodone-acetaminophen (NORCO/VICODIN) 5-325 MG per tablet Take 1 tablet by mouth every 6 (six) hours as needed for moderate pain.     Interferon Beta-1b (BETASERON) 0.3 MG KIT injection RECONSTITUTE AS DIRECTED AND INJECT 0.25MG  (= ) SUBCUTANEOUSLY ONCE EVERY OTHER DAY AS DIRECTED. 42 each 3   levothyroxine (SYNTHROID) 112 MCG tablet TAKE 1 TABLET (112 MCG TOTAL) BY MOUTH DAILY BEFORE BREAKFAST. 30 tablet 2   mometasone (NASONEX) 50 MCG/ACT nasal spray Place 2 sprays into the nose daily as needed. 16 g 5   RESTASIS 0.05 % ophthalmic emulsion 1 DROP INTO BOTH EYES TWICE A DAY     sertraline (ZOLOFT) 50 MG tablet TAKE 1 TABLET BY MOUTH EVERY DAY 90 tablet 1   telmisartan-hydrochlorothiazide (MICARDIS HCT) 40-12.5 MG tablet TAKE 1 TABLET BY MOUTH EVERY DAY 180 tablet 1   traMADol (ULTRAM) 50 MG tablet Take 1 tablet (50 mg total) by mouth every 6 (six) hours as needed. 30 tablet 0   triamcinolone ointment (KENALOG) 0.1 % APPLY TO AFFECTED AREA MONDAYS, WEDNESDAYS AND FRIDAYS  5   No current facility-administered  medications on file prior to visit.      Objective:  Objective  Physical Exam Vitals signs and nursing note reviewed.  Constitutional:      Appearance: She is well-developed.  HENT:     Head: Normocephalic and atraumatic.  Eyes:     Conjunctiva/sclera: Conjunctivae normal.  Neck:     Musculoskeletal: Normal range of motion and neck supple.     Thyroid: No thyromegaly.      Vascular: No carotid bruit or JVD.  Cardiovascular:     Rate and Rhythm: Normal rate and regular rhythm.     Heart sounds: Normal heart sounds. No murmur.  Pulmonary:     Effort: Pulmonary effort is normal. No respiratory distress.     Breath sounds: Normal breath sounds. No wheezing or rales.  Chest:     Chest wall: No tenderness.  Neurological:     Mental Status: She is alert and oriented to person, place, and time.     Coordination: Coordination normal.    BP 120/70 (BP Location: Right Arm, Patient Position: Sitting, Cuff Size: Large)    Pulse 88    Temp (!) 97.4 F (36.3 C) (Temporal)    Resp 18    Ht 5\' 6"  (1.676 m)    Wt 244 lb (110.7 kg)    SpO2 94%    BMI 39.38 kg/m  Wt Readings from Last 3 Encounters:  04/21/19 244 lb (110.7 kg)  06/11/18 246 lb (111.6 kg)  05/13/18 246 lb 9.6 oz (111.9 kg)   EKG-- NSR  Lab Results  Component Value Date   WBC 6.5 10/22/2018   HGB 14.6 10/22/2018   HCT 43.8 10/22/2018   PLT 139.0 (L) 10/22/2018   GLUCOSE 98 10/22/2018   CHOL 175 10/22/2018   TRIG 169.0 (H) 10/22/2018   HDL 42.20 10/22/2018   LDLCALC 99 10/22/2018   ALT 15 10/22/2018   AST 18 10/22/2018   NA 137 10/22/2018   K 4.3 10/22/2018   CL 101 10/22/2018   CREATININE 0.97 10/22/2018   BUN 18 10/22/2018   CO2 30 10/22/2018   TSH 0.83 12/23/2018   HGBA1C 5.9 09/25/2016   MICROALBUR 2.5 (H) 09/15/2014    Mm Diag Breast Tomo Uni Right  Result Date: 12/12/2018 CLINICAL DATA:  Six-month follow-up for probably benign right breast calcifications. EXAM: DIGITAL DIAGNOSTIC UNILATERAL RIGHT MAMMOGRAM WITH CAD AND TOMO COMPARISON:  Previous exam(s). ACR Breast Density Category b: There are scattered areas of fibroglandular density. FINDINGS: The 3-4 mm calcifications in the upper outer quadrant of the right breast are stable. No suspicious calcifications, masses or areas of distortion are seen in the right breast. Mammographic images were processed with CAD. IMPRESSION: The probably  benign calcifications in the upper-outer quadrant of the right breast are stable. RECOMMENDATION: Six-month follow-up bilateral diagnostic mammogram is recommended. I have discussed the findings and recommendations with the patient. Results were also provided in writing at the conclusion of the visit. If applicable, a reminder letter will be sent to the patient regarding the next appointment. BI-RADS CATEGORY  3: Probably benign. Electronically Signed   By: Frederico Hamman M.D.   On: 12/12/2018 15:18     Assessment & Plan:  Plan  I am having Cassandra Rios. Cassandra Rios maintain her HYDROcodone-acetaminophen, mometasone, triamcinolone ointment, traMADol, Interferon Beta-1b, telmisartan-hydrochlorothiazide, gabapentin, atorvastatin, sertraline, levothyroxine, and Restasis.  No orders of the defined types were placed in this encounter.   Problem List Items Addressed This Visit  Unprioritized   Depression with anxiety    Stable con't meds      DOE (dyspnea on exertion)    ekg-- nsr Refer back to cardiology Check cxr       Relevant Orders   EKG 12-Lead (Completed)   Ambulatory referral to Cardiology   DG Chest 2 View (HP) (Completed)   Hyperlipidemia    Encouraged heart healthy diet, increase exercise, avoid trans fats, consider a krill oil cap daily      Relevant Orders   Lipid panel   Comprehensive metabolic panel   Hypothyroidism - Primary    Check labs con't meds      Relevant Orders   TSH   MULTIPLE SCLEROSIS, RELAPSING/REMITTING    Per neuro       Other Visit Diagnoses    Need for influenza vaccination       Relevant Orders   Flu Vaccine QUAD High Dose(Fluad) (Completed)   SOB (shortness of breath)          Follow-up: Return in about 6 months (around 10/19/2019), or if symptoms worsen or fail to improve, for annual exam, fasting.  [name], DO

## 2019-04-21 NOTE — Assessment & Plan Note (Signed)
Stable con't meds 

## 2019-04-21 NOTE — Assessment & Plan Note (Signed)
Encouraged heart healthy diet, increase exercise, avoid trans fats, consider a krill oil cap daily 

## 2019-04-21 NOTE — Patient Instructions (Addendum)
Shortness of Breath, Adult °Shortness of breath is when a person has trouble breathing enough air or when a person feels like she or he is having trouble breathing in enough air. Shortness of breath could be a sign of a medical problem. °Follow these instructions at home: ° °· Pay attention to any changes in your symptoms. °· Do not use any products that contain nicotine or tobacco, such as cigarettes, e-cigarettes, and chewing tobacco. °· Do not smoke. Smoking is a common cause of shortness of breath. If you need help quitting, ask your health care provider. °· Avoid things that can irritate your airways, such as: °? Mold. °? Dust. °? Air pollution. °? Chemical fumes. °? Things that can cause allergy symptoms (allergens), if you have allergies. °· Keep your living space clean and free of mold and dust. °· Rest as needed. Slowly return to your usual activities. °· Take over-the-counter and prescription medicines only as told by your health care provider. This includes oxygen therapy and inhaled medicines. °· Keep all follow-up visits as told by your health care provider. This is important. °Contact a health care provider if: °· Your condition does not improve as soon as expected. °· You have a hard time doing your normal activities, even after you rest. °· You have new symptoms. °Get help right away if: °· Your shortness of breath gets worse. °· You have shortness of breath when you are resting. °· You feel light-headed or you faint. °· You have a cough that is not controlled with medicines. °· You cough up blood. °· You have pain with breathing. °· You have pain in your chest, arms, shoulders, or abdomen. °· You have a fever. °· You cannot walk up stairs or exercise the way that you normally do. °These symptoms may represent a serious problem that is an emergency. Do not wait to see if the symptoms will go away. Get medical help right away. Call your local emergency services (911 in the [place] Do not drive yourself  to the hospital. °Summary °· Shortness of breath is when a person has trouble breathing enough air. It can be a sign of a medical problem. °· Avoid things that irritate your lungs, such as smoking, pollution, mold, and dust. °· Pay attention to changes in your symptoms and contact your health care provider if you have a hard time completing daily activities because of shortness of breath. °This information is not intended to replace advice given to you by your health care provider. Make sure you discuss any questions you have with your health care provider. °Document Released: 02/14/2001 Document Revised: 10/22/2017 Document Reviewed: 10/22/2017 °Elsevier Patient Education © 2020 [employer] ° °

## 2019-04-21 NOTE — Assessment & Plan Note (Signed)
Check labs con't meds 

## 2019-04-21 NOTE — Assessment & Plan Note (Signed)
ekg-- nsr Refer back to cardiology Check cxr

## 2019-04-21 NOTE — Assessment & Plan Note (Signed)
Per neuro 

## 2019-04-21 NOTE — Assessment & Plan Note (Deleted)
ghm utd Check labs See AVS 

## 2019-04-22 ENCOUNTER — Telehealth: Payer: Self-pay | Admitting: Interventional Cardiology

## 2019-04-22 NOTE — Telephone Encounter (Signed)
Patient last saw Dr. Tamala Julian on 05/12/16. She had another referral sent over by Dr. Jerolyn Shin to schedule with Dr. Tamala Julian. I scheduled her for a new patient appt on 06/18/19 considering that would be over 3 years since she last saw him. She was wondering if she could take her Husbands slot who is an established patient of Dr. Tamala Julian. He has an appt with Dr. Tamala Julian 05/09/19, therefore, that would be before her 3 year mark and she would still be an established patient. I wanted to advise Dr. Tamala Julian and his nurse of this situation before I made those changes.  Patients Husband's name is Fayeth Huckabay, DOB : 10/16/49

## 2019-04-23 NOTE — Telephone Encounter (Signed)
Spoke with pt and moved appt up to 12/3.  Pt appreciative for call.

## 2019-04-28 ENCOUNTER — Other Ambulatory Visit: Payer: Self-pay | Admitting: Family Medicine

## 2019-04-28 DIAGNOSIS — E1169 Type 2 diabetes mellitus with other specified complication: Secondary | ICD-10-CM

## 2019-04-28 DIAGNOSIS — E785 Hyperlipidemia, unspecified: Secondary | ICD-10-CM

## 2019-05-05 NOTE — Progress Notes (Signed)
Cardiology Office Note:    Date:  05/08/2019   ID:  Cassandra Rios, DOB Mar 28, 1951, MRN [id number]  PCP:  [name], DO  Cardiologist:  No primary care provider on file.   Referring MD: Zola Button, Grayling Congress, *   Chief Complaint  Patient presents with  . Shortness of Breath  . Advice Only    Abnormal EKG    History of Present Illness:    Cassandra Rios is a 68 y.o. female with a hx of AAA, right bundle branch block, exertional dyspnea, chest discomfort, diaphoresis, three-vessel coronary calcification by CT 2019, and near syncope.  She is here today for evaluation of shortness of breath on exertion.  It is associated with diaphoresis, dizziness, and fatigue.  When she sits and rests, symptoms go away.  She has no orthopnea or PND.  There is no associated chest discomfort.  She does have cough.  She does have a diagnosis of COPD.  She stopped smoking over 10 years ago.  She has not had lower extremity swelling.  She does complain of occasional episodes of left parasternal, localized discomfort that can last seconds to less than 10 minutes.  These episodes are not precipitated by activity.  These episodes are not associated with the shortness of breath.  Past Medical History:  Diagnosis Date  . AAA (abdominal aortic aneurysm) (HCC)   . Allergic rhinitis   . Chronic hoarseness   . Colon polyps   . COPD (chronic obstructive pulmonary disease) (HCC)    Dr. Marchelle Gearing  . Dyslipidemia   . Hypertension   . Hypothyroidism   . MS (multiple sclerosis) (HCC)   . Obesity   . Stomach ulcer from aspirin/ibuprofen-like drugs (NSAID's)     Past Surgical History:  Procedure Laterality Date  . ABDOMINAL HYSTERECTOMY    . BARIATRIC SURGERY  09/07/08   lap band  . CARPAL TUNNEL RELEASE    . CATARACT EXTRACTION Right 11/08/2017  . CATARACT EXTRACTION Left 11/22/2017  . CATARACT EXTRACTION, BILATERAL      Toric lenses for astigmatism  . CHOLECYSTECTOMY    .  CHOLECYSTECTOMY    . HIATAL HERNIA REPAIR  09/07/08    Current Medications: Current Meds  Medication Sig  . gabapentin (NEURONTIN) 300 MG capsule Take 2 capsules by mouth daily.  Marland Kitchen HYDROcodone-acetaminophen (NORCO/VICODIN) 5-325 MG per tablet Take 1 tablet by mouth every 6 (six) hours as needed for moderate pain.  . Interferon Beta-1b (BETASERON) 0.3 MG KIT injection RECONSTITUTE AS DIRECTED AND INJECT 0.25MG  (= ) SUBCUTANEOUSLY ONCE EVERY OTHER DAY AS DIRECTED.  Marland Kitchen levothyroxine (SYNTHROID) 112 MCG tablet TAKE 1 TABLET (112 MCG TOTAL) BY MOUTH DAILY BEFORE BREAKFAST.  . mometasone (NASONEX) 50 MCG/ACT nasal spray Place 2 sprays into the nose daily as needed.  Bertram Gala Glycol-Propyl Glycol (SYSTANE ULTRA OP) Apply 1 drop to eye as needed (Right Eye for dry eyes).  . sertraline (ZOLOFT) 50 MG tablet TAKE 1 TABLET BY MOUTH EVERY DAY  . telmisartan-hydrochlorothiazide (MICARDIS HCT) 40-12.5 MG tablet TAKE 1 TABLET BY MOUTH EVERY DAY  . traMADol (ULTRAM) 50 MG tablet Take 1 tablet (50 mg total) by mouth every 6 (six) hours as needed.  . triamcinolone ointment (KENALOG) 0.1 % APPLY TO AFFECTED AREA MONDAYS, WEDNESDAYS AND FRIDAYS  . [DISCONTINUED] atorvastatin (LIPITOR) 20 MG tablet TAKE 1 TABLET BY MOUTH EVERY DAY     Allergies:   Codeine, Hydromorphone hcl, Ibuprofen, Morphine sulfate, and Metronidazole   Social History  Socioeconomic History  . Marital status: Married    Spouse name: Casimiro Needle  . Number of children: 2  . Years of education: 63  . Highest education level: Not on file  Occupational History  . Occupation: retired  Engineer, production  . Financial resource strain: Not on file  . Food insecurity    Worry: Not on file    Inability: Not on file  . Transportation needs    Medical: Not on file    Non-medical: Not on file  Tobacco Use  . Smoking status: Former Smoker    Packs/day: 2.00    Years: 40.00    Pack years: 80.00    Types: Cigarettes    Quit date: 04/05/2007     Years since quitting: 12.0  . Smokeless tobacco: Never Used  Substance and Sexual Activity  . Alcohol use: No  . Drug use: No  . Sexual activity: Never    Partners: Male  Lifestyle  . Physical activity    Days per week: 0 days    Minutes per session: 0 min  . Stress: Not at all  Relationships  . Social Musician on phone: Not on file    Gets together: Not on file    Attends religious service: Not on file    Active member of club or organization: Not on file    Attends meetings of clubs or organizations: Not on file    Relationship status: Not on file  Other Topics Concern  . Not on file  Social History Narrative   Patient is right handed,reside in home with husband     Family History: The patient's family history includes AAA (abdominal aortic aneurysm) in her father and sister; Arthritis in her sister; COPD in her father and sister; Dementia in her sister; Heart disease in her father and mother; Other in her sister; Stroke (age of onset: 27) in her mother.  ROS:   Please see the history of present illness.    Marked sedentary lifestyle.  She has an abdominal aortic aneurysm that is followed by Dr. Tawanna Cooler early.  She has a family history of atrial fibrillation (mother) she has a history of COPD diagnosed by Dr. Marchelle Gearing several years ago.  She has not been back.  He wanted to place her on chronic O2 therapy.  All other systems reviewed and are negative.  EKGs/Labs/Other Studies Reviewed:    The following studies were reviewed today: VASC US AAA 2020: Summary: Abdominal Aorta: There is evidence of abnormal dilitation of the Distal Abdominal aorta. There is evidence of abnormal dilation of the Right Common Iliac artery and Left Common Iliac artery. The largest aortic measurement is 4.0 cm. The largest aortic  diameter remains essentially unchanged compared to prior exam. Previous diameter measurement was 3.9 cm obtained on 04/14/2016 at [facility] imaging.  Stress  myocardial perfusion imaging 05/2016: Study Highlights    Nuclear stress EF: 74%.  This is a low risk study.  No T wave inversion was noted during stress.  There was no ST segment deviation noted during stress.   Normal perfusion. LVEF 74% with normal wall motion. This is a low risk study.   CT chest and lungs December 2019: IMPRESSION: 1. Lung-RADS 2, benign appearance or behavior. Continue annual screening with low-dose chest CT without contrast in 12 months. 2. Aortic Atherosclerosis (ICD10-I70.0) and Emphysema (ICD10-J43.9). 3. Multi vessel coronary artery atherosclerotic calcifications including left main disease.  EKG:  EKG normal sinus rhythm, right bundle  branch block, and no change compared to 2017.  Recent Labs: 10/22/2018: Hemoglobin 14.6; Platelets 139.0 04/21/2019: ALT 16; BUN 11; Creatinine, Ser 0.98; Potassium 4.6; Sodium 134; TSH 0.82  Recent Lipid Panel    Component Value Date/Time   CHOL 190 04/21/2019 1036   TRIG 141.0 04/21/2019 1036   HDL 44.90 04/21/2019 1036   CHOLHDL 4 04/21/2019 1036   VLDL 28.2 04/21/2019 1036   LDLCALC 116 (H) 04/21/2019 1036    Physical Exam:    VS:  BP 108/64   Pulse 86   Ht 5\' 6"  (1.676 m)   Wt 249 lb 6.4 oz (113.1 kg)   SpO2 98%   BMI 40.25 kg/m     Wt Readings from Last 3 Encounters:  05/08/19 249 lb 6.4 oz (113.1 kg)  04/21/19 244 lb (110.7 kg)  06/11/18 246 lb (111.6 kg)     GEN: Morbidly obese. No acute distress HEENT: Normal NECK: No JVD. LYMPHATICS: No lymphadenopathy CARDIAC:  RRR without murmur, gallop, or edema. VASCULAR:  Normal Pulses. No bruits. RESPIRATORY:  Clear to auscultation without rales, wheezing or rhonchi  ABDOMEN: Soft, non-tender, non-distended, No pulsatile mass, MUSCULOSKELETAL: No deformity  SKIN: Warm and dry NEUROLOGIC:  Alert and oriented x 3 PSYCHIATRIC:  Normal affect   ASSESSMENT:    1. DOE (dyspnea on exertion)   2. Coronary artery calcification   3. Hyperlipidemia  LDL goal <100   4. Essential hypertension   5. COPD with acute bronchitis (HCC)   6. Right bundle branch block   7. AAA (abdominal aortic aneurysm) without rupture (HCC)   8. Educated about COVID-19 virus infection   9. SOB (shortness of breath)   10. CAD in native artery    PLAN:    In order of problems listed above:  1. She has had progression of dyspnea on exertion over the past 6 months.  There has been no acute change.  She has very sedentary.  She has COPD that was apparently bad enough several years ago that Dr. Marchelle Gearing recommended oxygen therapy.  She refused.  In this context she is now wondering if the shortness of breath she is having is related to her heart.  She has no evidence of volume overload on exam.  She has no orthopnea.  She does have significant left main and three-vessel coronary calcification on a 9 coronary CT performed 1 year ago.  We will plan to perform a formal coronary CT angiogram with FFR if indicated as she does not want to pursue invasive angiography unless absolutely necessary.  She had a myocardial perfusion study performed 3 years ago and it was low risk for ischemia. 2. Three-vessel coronary calcification.  Coronary CTA with FFR will be performed to rule out severe three-vessel coronary disease plus minus left main disease as a source of her dyspnea. 3. Increase atorvastatin to 40 mg/day.  Drive LDL to less than 70.  Lipid panel will be performed in 6 weeks. 4. Adequate blood pressure control 5. BNP will be obtained today to help differentiate whether shortness of breath as long or cardiac related. 6. Right bundle branch block appearance is stable. 7. Followed by Dr. Arbie Cookey 8. 3W's is being practiced to avoid COVID-19 infection.   Medication Adjustments/Labs and Tests Ordered: Current medicines are reviewed at length with the patient today.  Concerns regarding medicines are outlined above.  Orders Placed This Encounter  Procedures  . CT CORONARY MORPH  W/CTA COR W/SCORE W/CA W/CM &/OR WO/CM  . CT  CORONARY FRACTIONAL FLOW RESERVE DATA PREP  . CT CORONARY FRACTIONAL FLOW RESERVE FLUID ANALYSIS  . Pro b natriuretic peptide  . Hepatic function panel  . Lipid panel   Meds ordered this encounter  Medications  . metoprolol tartrate (LOPRESSOR) 100 MG tablet    Sig: Take one tablet by mouth 2 hours prior to CT    Dispense:  1 tablet    Refill:  0  . atorvastatin (LIPITOR) 40 MG tablet    Sig: Take 1 tablet (40 mg total) by mouth daily.    Dispense:  90 tablet    Refill:  3    Dose change    Patient Instructions  Medication Instructions:  1) INCREASE Atorvastatin to 40mg  once daily  *If you need a refill on your cardiac medications before your next appointment, please call your pharmacy*  Lab Work: Pro BNP today  Your physician recommends that you return for lab work in: 6-8 weeks.  You will need to fast for these labs (nothing to eat or drink after midnight except water and black coffee).  If you have labs (blood work) drawn today and your tests are completely normal, you will receive your results only by: Marland Kitchen MyChart Message (if you have MyChart) OR . A paper copy in the mail If you have any lab test that is abnormal or we need to change your treatment, we will call you to review the results.  Testing/Procedures: Your physician recommends that you have a Coronary CT performed.  Follow-Up: At [facility], you and your health needs are our priority.  As part of our continuing mission to provide you with exceptional heart care, we have created designated Provider Care Teams.  These Care Teams include your primary Cardiologist (physician) and Advanced Practice Providers (APPs -  Physician Assistants and Nurse Practitioners) who all work together to provide you with the care you need, when you need it.  Your next appointment:   6 month(s)  The format for your next appointment:   In Person  Provider:   You may see Dr. Verdis Prime or one of the following Advanced Practice Providers on your designated Care Team:    Norma Fredrickson, NP  Nada Boozer, NP  Georgie Chard, NP   Other Instructions  Your cardiac CT will be scheduled at one of the below locations:   [facility] [address] [place], [place] [address] [phone]  OR  [facility] [address] [place], [place] [address] [phone]  If scheduled at [facility], please arrive at the [facility] main entrance of [facility] 30-45 minutes prior to test start time. Proceed to the [facility] Radiology Department (first floor) to check-in and test prep.  If scheduled at [facility], please arrive 15 mins early for check-in and test prep.  Please follow these instructions carefully (unless otherwise directed):  Hold all erectile dysfunction medications at least 3 days (72 hrs) prior to test.  On the Night Before the Test: . Be sure to Drink plenty of water. . Do not consume any caffeinated/decaffeinated beverages or chocolate 12 hours prior to your test. . Do not take any antihistamines 12 hours prior to your test.  On the Day of the Test: . Drink plenty of water. Do not drink any water within one hour of the test. . Do not eat any food 4 hours prior to the test. . You may take your regular  medications prior to the test.  . Take metoprolol (Lopressor) two hours prior to test. . HOLD Furosemide/Hydrochlorothiazide morning of the test. . FEMALES- please wear underwire-free bra if available       After the Test: . Drink plenty of water. . After receiving IV contrast, you may experience a mild flushed feeling. This is normal. . On occasion, you may experience a mild rash up to 24 hours after the test. This is not dangerous. If this occurs, you can take Benadryl 25 mg and increase your fluid intake. . If you experience trouble breathing,  this can be serious. If it is severe call 911 IMMEDIATELY. If it is mild, please call our office. . If you take any of these medications: Glipizide/Metformin, Avandament, Glucavance, please do not take 48 hours after completing test unless otherwise instructed.   Once we have confirmed authorization from your insurance company, we will call you to set up a date and time for your test.   For non-scheduling related questions, please contact the cardiac imaging nurse navigator should you have any questions/concerns: Rockwell Alexandria, RN Navigator Cardiac Imaging [facility] [phone] Office        Signed, Lesleigh Noe, MD  05/08/2019 11:06 AM    [facility]

## 2019-05-08 ENCOUNTER — Other Ambulatory Visit: Payer: Self-pay

## 2019-05-08 ENCOUNTER — Ambulatory Visit (INDEPENDENT_AMBULATORY_CARE_PROVIDER_SITE_OTHER): Payer: PPO | Admitting: Interventional Cardiology

## 2019-05-08 ENCOUNTER — Encounter: Payer: Self-pay | Admitting: Interventional Cardiology

## 2019-05-08 VITALS — BP 108/64 | HR 86 | Ht 66.0 in | Wt 249.4 lb

## 2019-05-08 DIAGNOSIS — I2584 Coronary atherosclerosis due to calcified coronary lesion: Secondary | ICD-10-CM

## 2019-05-08 DIAGNOSIS — J440 Chronic obstructive pulmonary disease with acute lower respiratory infection: Secondary | ICD-10-CM

## 2019-05-08 DIAGNOSIS — E785 Hyperlipidemia, unspecified: Secondary | ICD-10-CM | POA: Diagnosis not present

## 2019-05-08 DIAGNOSIS — I2510 Atherosclerotic heart disease of native coronary artery without angina pectoris: Secondary | ICD-10-CM

## 2019-05-08 DIAGNOSIS — I10 Essential (primary) hypertension: Secondary | ICD-10-CM

## 2019-05-08 DIAGNOSIS — I4510 Unspecified right bundle-branch block: Secondary | ICD-10-CM | POA: Diagnosis not present

## 2019-05-08 DIAGNOSIS — J209 Acute bronchitis, unspecified: Secondary | ICD-10-CM | POA: Diagnosis not present

## 2019-05-08 DIAGNOSIS — R0602 Shortness of breath: Secondary | ICD-10-CM | POA: Diagnosis not present

## 2019-05-08 DIAGNOSIS — I714 Abdominal aortic aneurysm, without rupture, unspecified: Secondary | ICD-10-CM

## 2019-05-08 DIAGNOSIS — R0600 Dyspnea, unspecified: Secondary | ICD-10-CM | POA: Diagnosis not present

## 2019-05-08 DIAGNOSIS — Z7189 Other specified counseling: Secondary | ICD-10-CM

## 2019-05-08 DIAGNOSIS — R0609 Other forms of dyspnea: Secondary | ICD-10-CM

## 2019-05-08 LAB — PRO B NATRIURETIC PEPTIDE: NT-Pro BNP: 304 pg/mL — ABNORMAL HIGH (ref 0–301)

## 2019-05-08 MED ORDER — ATORVASTATIN CALCIUM 40 MG PO TABS: 40.0000 mg | ORAL_TABLET | Freq: Every day | ORAL | 3 refills | Status: DC

## 2019-05-08 MED ORDER — METOPROLOL TARTRATE 100 MG PO TABS: ORAL_TABLET | ORAL | 0 refills | Status: DC

## 2019-05-08 NOTE — Patient Instructions (Signed)
Medication Instructions:  1) INCREASE Atorvastatin to 40mg  once daily  *If you need a refill on your cardiac medications before your next appointment, please call your pharmacy*  Lab Work: Pro BNP today  Your physician recommends that you return for lab work in: 6-8 weeks.  You will need to fast for these labs (nothing to eat or drink after midnight except water and black coffee).  If you have labs (blood work) drawn today and your tests are completely normal, you will receive your results only by: Marland Kitchen MyChart Message (if you have MyChart) OR . A paper copy in the mail If you have any lab test that is abnormal or we need to change your treatment, we will call you to review the results.  Testing/Procedures: Your physician recommends that you have a Coronary CT performed.  Follow-Up: At [facility], you and your health needs are our priority.  As part of our continuing mission to provide you with exceptional heart care, we have created designated Provider Care Teams.  These Care Teams include your primary Cardiologist (physician) and Advanced Practice Providers (APPs -  Physician Assistants and Nurse Practitioners) who all work together to provide you with the care you need, when you need it.  Your next appointment:   6 month(s)  The format for your next appointment:   In Person  Provider:   You may see Dr. Daneen Schick or one of the following Advanced Practice Providers on your designated Care Team:    Truitt Merle, NP  Cecilie Kicks, NP  Kathyrn Drown, NP   Other Instructions  Your cardiac CT will be scheduled at one of the below locations:   [facility] [address] [place], [place] [address] [phone]  [place] [address] [place], [place] [address] [phone]  If scheduled at [facility], please arrive at the [facility] main entrance of [facility] 30-45 minutes prior  to test start time. Proceed to the [facility] Radiology Department (first floor) to check-in and test prep.  If scheduled at [facility], please arrive 15 mins early for check-in and test prep.  Please follow these instructions carefully (unless otherwise directed):  Hold all erectile dysfunction medications at least 3 days (72 hrs) prior to test.  On the Night Before the Test: . Be sure to Drink plenty of water. . Do not consume any caffeinated/decaffeinated beverages or chocolate 12 hours prior to your test. . Do not take any antihistamines 12 hours prior to your test.  On the Day of the Test: . Drink plenty of water. Do not drink any water within one hour of the test. . Do not eat any food 4 hours prior to the test. . You may take your regular medications prior to the test.  . Take metoprolol (Lopressor) two hours prior to test. . HOLD Furosemide/Hydrochlorothiazide morning of the test. . FEMALES- please wear underwire-free bra if available       After the Test: . Drink plenty of water. . After receiving IV contrast, you may experience a mild flushed feeling. This is normal. . On occasion, you may experience a mild rash up to 24 hours after the test. This is not dangerous. If this occurs, you can take Benadryl 25 mg and increase your fluid intake. . If you experience trouble breathing, this can be serious. If it is severe call 911 IMMEDIATELY. If it is mild, please call our  office. . If you take any of these medications: Glipizide/Metformin, Avandament, Glucavance, please do not take 48 hours after completing test unless otherwise instructed.   Once we have confirmed authorization from your insurance company, we will call you to set up a date and time for your test.   For non-scheduling related questions, please contact the cardiac imaging nurse navigator should you have any questions/concerns: Marchia Bond, RN Navigator Cardiac Imaging [facility] [phone] Office

## 2019-05-08 NOTE — Addendum Note (Signed)
Addended by: Loren Racer on: 05/08/2019 11:11 AM   Modules accepted: Orders

## 2019-05-12 ENCOUNTER — Other Ambulatory Visit: Payer: Self-pay | Admitting: Family Medicine

## 2019-05-14 ENCOUNTER — Other Ambulatory Visit: Payer: Self-pay

## 2019-05-14 ENCOUNTER — Encounter: Payer: Self-pay | Admitting: Adult Health

## 2019-05-14 ENCOUNTER — Ambulatory Visit: Payer: PPO | Admitting: Adult Health

## 2019-05-14 VITALS — BP 135/77 | HR 65 | Temp 97.1°F | Ht 66.0 in | Wt 248.2 lb

## 2019-05-14 DIAGNOSIS — G35 Multiple sclerosis: Secondary | ICD-10-CM

## 2019-05-14 NOTE — Progress Notes (Signed)
I have read the note, and I agree with the clinical assessment and plan.  Charles K Willis   

## 2019-05-14 NOTE — Progress Notes (Signed)
PATIENT: Margert Siman DOB: 1950/09/17  REASON FOR VISIT: follow up HISTORY FROM: patient  HISTORY OF PRESENT ILLNESS: Today 05/14/19:  Ms. Tatarian is a 68 year old female with a history of multiple sclerosis.  She returns today for follow-up.  She remains on Betaseron.  She denies any new symptoms.  No new numbness or tingling.  No changes with her gait or balance.  No change with the bowels or bladder.  No changes with her vision.  She states that she has been having shortness of breath.  Her cardiologist has ordered a CT of the heart.  She denies any other symptoms.  She returns today for an evaluation.  HISTORY 11/11/18:  Ms. Hilpert is an 68 year old female with a history of multiple sclerosis.  She continues on Betaseron.  Reports that she continues to tolerate the medication well.  She recently had blood work all of which was relatively unremarkable.  The patient states that she has not had any significant changes with the bowels or bladder.  No changes in her vision.  She does have that she had cataract surgery last summer and she no longer has to wear glasses.  Denies any new numbness or weakness.  She does report arthritic changes in the hands.  She states that her balance has been slightly off since she broke her leg last year.  She denies any falls.  Reports that she has not been getting out much due to COVID-19.  She denies any new issues.  REVIEW OF SYSTEMS: Out of a complete 14 system review of symptoms, the patient complains only of the following symptoms, and all other reviewed systems are negative.  See HPI  ALLERGIES: Allergies  Allergen Reactions   Codeine Itching and Nausea And Vomiting   Hydromorphone Hcl     REACTION: nausea/vomiting   Ibuprofen Other (See Comments)    REACTION: stomach ulcers   Morphine Sulfate     "makes my blood pressure bottom out"   Metronidazole Hives, Nausea And Vomiting and Rash    HOME MEDICATIONS: Outpatient  Medications Prior to Visit  Medication Sig Dispense Refill   atorvastatin (LIPITOR) 40 MG tablet Take 1 tablet (40 mg total) by mouth daily. 90 tablet 3   gabapentin (NEURONTIN) 300 MG capsule Take 2 capsules by mouth daily.     HYDROcodone-acetaminophen (NORCO/VICODIN) 5-325 MG per tablet Take 1 tablet by mouth as needed for moderate pain.      Interferon Beta-1b (BETASERON) 0.3 MG KIT injection RECONSTITUTE AS DIRECTED AND INJECT 0.25MG  (= ) SUBCUTANEOUSLY ONCE EVERY OTHER DAY AS DIRECTED. 42 each 3   levothyroxine (SYNTHROID) 112 MCG tablet TAKE 1 TABLET (112 MCG TOTAL) BY MOUTH DAILY BEFORE BREAKFAST. 90 tablet 0   metoprolol tartrate (LOPRESSOR) 100 MG tablet Take one tablet by mouth 2 hours prior to CT 1 tablet 0   mometasone (NASONEX) 50 MCG/ACT nasal spray Place 2 sprays into the nose daily as needed. 16 g 5   Polyethyl Glycol-Propyl Glycol (SYSTANE ULTRA OP) Apply 1 drop to eye as needed (Right Eye for dry eyes).     sertraline (ZOLOFT) 50 MG tablet TAKE 1 TABLET BY MOUTH EVERY DAY 90 tablet 1   telmisartan-hydrochlorothiazide (MICARDIS HCT) 40-12.5 MG tablet TAKE 1 TABLET BY MOUTH EVERY DAY 180 tablet 1   traMADol (ULTRAM) 50 MG tablet Take 1 tablet (50 mg total) by mouth every 6 (six) hours as needed. (Patient taking differently: Take 50 mg by mouth as needed. ) 30 tablet  0   triamcinolone ointment (KENALOG) 0.1 % APPLY TO AFFECTED AREA MONDAYS, WEDNESDAYS AND FRIDAYS  5   No facility-administered medications prior to visit.     PAST MEDICAL HISTORY: Past Medical History:  Diagnosis Date   AAA (abdominal aortic aneurysm) (HCC)    Allergic rhinitis    Chronic hoarseness    Colon polyps    COPD (chronic obstructive pulmonary disease) (HCC)    Dr. Marchelle Gearing   Dyslipidemia    Hypertension    Hypothyroidism    MS (multiple sclerosis) (HCC)    Obesity    Stomach ulcer from aspirin/ibuprofen-like drugs (NSAID's)     PAST SURGICAL HISTORY: Past  Surgical History:  Procedure Laterality Date   ABDOMINAL HYSTERECTOMY     BARIATRIC SURGERY  09/07/08   lap band   CARPAL TUNNEL RELEASE     CATARACT EXTRACTION Right 11/08/2017   CATARACT EXTRACTION Left 11/22/2017   CATARACT EXTRACTION, BILATERAL      Toric lenses for astigmatism   CHOLECYSTECTOMY     CHOLECYSTECTOMY     HIATAL HERNIA REPAIR  09/07/08    FAMILY HISTORY: Family History  Problem Relation Age of Onset   COPD Father    Heart disease Father    AAA (abdominal aortic aneurysm) Father    COPD Sister    Arthritis Sister    AAA (abdominal aortic aneurysm) Sister    Dementia Sister    Other Sister        brain injury   Stroke Mother 30   Heart disease Mother        congenital heart defect?    SOCIAL HISTORY: Social History   Socioeconomic History   Marital status: Married    Spouse name: Casimiro Needle   Number of children: 2   Years of education: 16   Highest education level: Not on file  Occupational History   Occupation: retired  Ecologist strain: Not on file   Food insecurity    Worry: Not on file    Inability: Not on Occupational hygienist needs    Medical: Not on file    Non-medical: Not on file  Tobacco Use   Smoking status: Former Smoker    Packs/day: 2.00    Years: 40.00    Pack years: 80.00    Types: Cigarettes    Quit date: 04/05/2007    Years since quitting: 12.1   Smokeless tobacco: Never Used  Substance and Sexual Activity   Alcohol use: No   Drug use: No   Sexual activity: Never    Partners: Male  Lifestyle   Physical activity    Days per week: 0 days    Minutes per session: 0 min   Stress: Not at all  Relationships   Social connections    Talks on phone: Not on file    Gets together: Not on file    Attends religious service: Not on file    Active member of club or organization: Not on file    Attends meetings of clubs or organizations: Not on file    Relationship status:  Not on file   Intimate partner violence    Fear of current or ex partner: Not on file    Emotionally abused: Not on file    Physically abused: Not on file    Forced sexual activity: Not on file  Other Topics Concern   Not on file  Social History Narrative   Patient is right handed,reside in  home with husband      PHYSICAL EXAM  Vitals:   05/14/19 0920  BP: 135/77  Pulse: 65  Temp: (!) 97.1 F (36.2 C)  TempSrc: Oral  Weight: 248 lb 3.2 oz (112.6 kg)  Height: 5\' 6"  (1.676 m)   Body mass index is 40.06 kg/m.  Generalized: Well developed, in no acute distress   Neurological examination  Mentation: Alert oriented to time, place, history taking. Follows all commands speech and language fluent Cranial nerve II-XII: Pupils were equal round reactive to light. Extraocular movements were full, visual field were full on confrontational test. Facial sensation and strength were normal. Uvula tongue midline. Head turning and shoulder shrug  were normal and symmetric. Motor: The motor testing reveals 5 over 5 strength of all 4 extremities. Good symmetric motor tone is noted throughout.  Sensory: Sensory testing is intact to soft touch on all 4 extremities. No evidence of extinction is noted.  Coordination: Cerebellar testing reveals good finger-nose-finger and heel-to-shin bilaterally.  Gait and station: Gait is normal. Tandem gait is normal. Romberg is negative. No drift is seen.  Reflexes: Deep tendon reflexes are symmetric and normal bilaterally.   DIAGNOSTIC DATA (LABS, IMAGING, TESTING) - I reviewed patient records, labs, notes, testing and imaging myself where available.  Lab Results  Component Value Date   WBC 6.5 10/22/2018   HGB 14.6 10/22/2018   HCT 43.8 10/22/2018   MCV 89.5 10/22/2018   PLT 139.0 (L) 10/22/2018      Component Value Date/Time   NA 134 (L) 04/21/2019 1036   NA 139 09/05/2016 0944   K 4.6 04/21/2019 1036   CL 98 04/21/2019 1036   CO2 27 04/21/2019  1036   GLUCOSE 106 (H) 04/21/2019 1036   BUN 11 04/21/2019 1036   BUN 13 09/05/2016 0944   CREATININE 0.98 04/21/2019 1036   CREATININE 1.04 05/13/2014 0840   CALCIUM 9.7 04/21/2019 1036   PROT 7.0 04/21/2019 1036   PROT 6.7 09/05/2016 0944   ALBUMIN 4.1 04/21/2019 1036   ALBUMIN 4.0 09/05/2016 0944   AST 22 04/21/2019 1036   ALT 16 04/21/2019 1036   ALKPHOS 74 04/21/2019 1036   BILITOT 0.6 04/21/2019 1036   BILITOT 0.4 09/05/2016 0944   GFRNONAA >60 12/30/2017 0436   GFRNONAA 57 (L) 05/13/2014 0840   GFRAA >60 12/30/2017 0436   GFRAA 66 05/13/2014 0840   Lab Results  Component Value Date   CHOL 190 04/21/2019   HDL 44.90 04/21/2019   LDLCALC 116 (H) 04/21/2019   TRIG 141.0 04/21/2019   CHOLHDL 4 04/21/2019   Lab Results  Component Value Date   HGBA1C 5.9 09/25/2016   Lab Results  Component Value Date   VITAMINB12 648 03/16/2010   Lab Results  Component Value Date   TSH 0.82 04/21/2019      ASSESSMENT AND PLAN 69 y.o. year old female  has a past medical history of AAA (abdominal aortic aneurysm) (HCC), Allergic rhinitis, Chronic hoarseness, Colon polyps, COPD (chronic obstructive pulmonary disease) (HCC), Dyslipidemia, Hypertension, Hypothyroidism, MS (multiple sclerosis) (HCC), Obesity, and Stomach ulcer from aspirin/ibuprofen-like drugs (NSAID's). here with:  1.  Multiple sclerosis  -Continue Betaseron -Blood work recently performed with PCP -We will consider repeating MRI at the next visit.  Patient requested to hold off until after she had the CT of the heart.  Patient advised that if her symptoms worsen or she develops new symptoms she should let us know.  She will follow-up in 6 months or sooner  if needed.   I spent 15 minutes with the patient. 50% of this time was spent reviewing plan of care   Butch Penny, MSN, NP-C 05/14/2019, 9:42 AM [facility] [address] [place], [place] [address] [phone]

## 2019-05-14 NOTE — Patient Instructions (Signed)
Your Plan:  Continue Betaseron Consider MRI at the next visit If your symptoms worsen or you develop new symptoms please let us know.    Thank you for coming to see [name] at [facility]. I hope we have been able to provide you high quality care today.  You may receive a patient satisfaction survey over the next few weeks. We would appreciate your feedback and comments so that we may continue to improve ourselves and the health of our patients.

## 2019-05-15 ENCOUNTER — Ambulatory Visit (INDEPENDENT_AMBULATORY_CARE_PROVIDER_SITE_OTHER)
Admission: RE | Admit: 2019-05-15 | Discharge: 2019-05-15 | Disposition: A | Discharge status: Home / Self Care | Payer: PPO | Source: Ambulatory Visit | Attending: Acute Care | Admitting: Acute Care

## 2019-05-15 ENCOUNTER — Other Ambulatory Visit: Payer: Self-pay

## 2019-05-15 DIAGNOSIS — Z122 Encounter for screening for malignant neoplasm of respiratory organs: Secondary | ICD-10-CM

## 2019-05-15 DIAGNOSIS — Z87891 Personal history of nicotine dependence: Secondary | ICD-10-CM | POA: Diagnosis not present

## 2019-05-16 ENCOUNTER — Telehealth: Payer: Self-pay | Admitting: Acute Care

## 2019-05-16 DIAGNOSIS — Z87891 Personal history of nicotine dependence: Secondary | ICD-10-CM

## 2019-05-16 NOTE — Telephone Encounter (Signed)
Pt informed of CT results per Sarah Groce, NP.  PT verbalized understanding.  Copy sent to PCP.  Order placed for 1 yr f/u CT.  

## 2019-06-09 ENCOUNTER — Other Ambulatory Visit: Payer: Self-pay

## 2019-06-09 ENCOUNTER — Ambulatory Visit
Admission: RE | Admit: 2019-06-09 | Discharge: 2019-06-09 | Disposition: A | Discharge status: Home / Self Care | Payer: PPO | Source: Ambulatory Visit | Attending: Family Medicine | Admitting: Family Medicine

## 2019-06-09 ENCOUNTER — Other Ambulatory Visit: Payer: Self-pay | Admitting: Family Medicine

## 2019-06-09 DIAGNOSIS — R928 Other abnormal and inconclusive findings on diagnostic imaging of breast: Secondary | ICD-10-CM

## 2019-06-09 DIAGNOSIS — R921 Mammographic calcification found on diagnostic imaging of breast: Secondary | ICD-10-CM | POA: Diagnosis not present

## 2019-06-14 ENCOUNTER — Other Ambulatory Visit: Payer: Self-pay | Admitting: Family Medicine

## 2019-06-14 DIAGNOSIS — F418 Other specified anxiety disorders: Secondary | ICD-10-CM

## 2019-06-18 ENCOUNTER — Ambulatory Visit: Payer: PPO | Admitting: Interventional Cardiology

## 2019-06-24 ENCOUNTER — Other Ambulatory Visit: Payer: PPO

## 2019-06-24 ENCOUNTER — Other Ambulatory Visit: Payer: Self-pay

## 2019-06-24 DIAGNOSIS — R0600 Dyspnea, unspecified: Secondary | ICD-10-CM | POA: Diagnosis not present

## 2019-06-24 DIAGNOSIS — E785 Hyperlipidemia, unspecified: Secondary | ICD-10-CM | POA: Diagnosis not present

## 2019-06-24 DIAGNOSIS — I2584 Coronary atherosclerosis due to calcified coronary lesion: Secondary | ICD-10-CM

## 2019-06-24 DIAGNOSIS — R0609 Other forms of dyspnea: Secondary | ICD-10-CM

## 2019-06-24 DIAGNOSIS — I2510 Atherosclerotic heart disease of native coronary artery without angina pectoris: Secondary | ICD-10-CM | POA: Diagnosis not present

## 2019-06-24 LAB — LIPID PANEL
Chol/HDL Ratio: 3.1 ratio (ref 0.0–4.4)
Cholesterol, Total: 146 mg/dL (ref 100–199)
HDL: 47 mg/dL (ref >39)
LDL Chol Calc (NIH): 78 mg/dL (ref 0–99)
Triglycerides: 117 mg/dL (ref 0–149)
VLDL Cholesterol Cal: 21 mg/dL (ref 5–40)

## 2019-06-24 LAB — BASIC METABOLIC PANEL
BUN/Creatinine Ratio: 14 (ref 12–28)
BUN: 14 mg/dL (ref 8–27)
CO2: 24 mmol/L (ref 20–29)
Calcium: 9.2 mg/dL (ref 8.7–10.3)
Chloride: 99 mmol/L (ref 96–106)
Creatinine, Ser: 1.03 mg/dL — ABNORMAL HIGH (ref 0.57–1.00)
GFR calc Af Amer: 64 mL/min/{1.73_m2} (ref >59)
GFR calc non Af Amer: 56 mL/min/{1.73_m2} — ABNORMAL LOW (ref >59)
Glucose: 102 mg/dL — ABNORMAL HIGH (ref 65–99)
Potassium: 4.4 mmol/L (ref 3.5–5.2)
Sodium: 136 mmol/L (ref 134–144)

## 2019-06-24 LAB — HEPATIC FUNCTION PANEL
ALT: 13 IU/L (ref 0–32)
AST: 19 IU/L (ref 0–40)
Albumin: 3.9 g/dL (ref 3.8–4.8)
Alkaline Phosphatase: 86 IU/L (ref 39–117)
Bilirubin Total: 0.3 mg/dL (ref 0.0–1.2)
Bilirubin, Direct: 0.08 mg/dL (ref 0.00–0.40)
Total Protein: 6.2 g/dL (ref 6.0–8.5)

## 2019-06-26 ENCOUNTER — Telehealth (HOSPITAL_COMMUNITY): Payer: Self-pay | Admitting: Emergency Medicine

## 2019-06-26 NOTE — Telephone Encounter (Signed)
Left message on voicemail with name and callback number Riely Baskett RN Navigator Cardiac Imaging [facility] [phone] Office [phone] Cell  

## 2019-06-27 ENCOUNTER — Other Ambulatory Visit: Payer: Self-pay | Admitting: Family Medicine

## 2019-06-27 ENCOUNTER — Other Ambulatory Visit: Payer: Self-pay

## 2019-06-27 ENCOUNTER — Ambulatory Visit (HOSPITAL_COMMUNITY)
Admission: RE | Admit: 2019-06-27 | Discharge: 2019-06-27 | Disposition: A | Discharge status: Home / Self Care | Payer: PPO | Source: Ambulatory Visit | Attending: Interventional Cardiology | Admitting: Interventional Cardiology

## 2019-06-27 DIAGNOSIS — I700 Atherosclerosis of aorta: Secondary | ICD-10-CM | POA: Diagnosis not present

## 2019-06-27 DIAGNOSIS — I2510 Atherosclerotic heart disease of native coronary artery without angina pectoris: Secondary | ICD-10-CM | POA: Diagnosis not present

## 2019-06-27 DIAGNOSIS — R0602 Shortness of breath: Secondary | ICD-10-CM

## 2019-06-27 DIAGNOSIS — I10 Essential (primary) hypertension: Secondary | ICD-10-CM

## 2019-06-27 MED ORDER — NITROGLYCERIN 0.4 MG SL SUBL
SUBLINGUAL_TABLET | SUBLINGUAL | Status: AC
  Filled 2019-06-27: qty 2

## 2019-06-27 MED ORDER — NITROGLYCERIN 0.4 MG SL SUBL
0.8000 mg | SUBLINGUAL_TABLET | Freq: Once | SUBLINGUAL | Status: AC
  Administered 2019-06-27: 0.8 mg via SUBLINGUAL

## 2019-06-27 MED ORDER — IOHEXOL 350 MG/ML SOLN
100.0000 mL | Freq: Once | INTRAVENOUS | Status: AC | PRN
  Administered 2019-06-27: 100 mL via INTRAVENOUS

## 2019-06-28 ENCOUNTER — Ambulatory Visit (HOSPITAL_COMMUNITY)
Admission: RE | Admit: 2019-06-28 | Discharge: 2019-06-28 | Disposition: A | Discharge status: Home / Self Care | Payer: PPO | Source: Ambulatory Visit | Attending: Interventional Cardiology | Admitting: Interventional Cardiology

## 2019-06-28 DIAGNOSIS — I2510 Atherosclerotic heart disease of native coronary artery without angina pectoris: Secondary | ICD-10-CM | POA: Insufficient documentation

## 2019-06-28 DIAGNOSIS — I700 Atherosclerosis of aorta: Secondary | ICD-10-CM | POA: Diagnosis not present

## 2019-06-28 DIAGNOSIS — R0602 Shortness of breath: Secondary | ICD-10-CM | POA: Diagnosis not present

## 2019-06-30 ENCOUNTER — Encounter: Payer: Self-pay | Admitting: Family Medicine

## 2019-06-30 DIAGNOSIS — I700 Atherosclerosis of aorta: Secondary | ICD-10-CM | POA: Diagnosis not present

## 2019-06-30 DIAGNOSIS — I2510 Atherosclerotic heart disease of native coronary artery without angina pectoris: Secondary | ICD-10-CM | POA: Diagnosis not present

## 2019-07-02 NOTE — Progress Notes (Signed)
Cardiology Office Note   Date:  07/03/2019   ID:  Cassandra Rios, DOB [birth year], MRN [id number]  PCP:  [name], DO  Cardiologist:  Dr. Katrinka Blazing     Chief Complaint  Patient presents with  . Shortness of Breath    and abnormal cardiac CTA       History of Present Illness: Cassandra Rios is a 69 y.o. female who presents for angina, with DOE, diaphoresis, dizziness, at times feels she could pass out but has not.  With rest resolves.     Last saw Dr. Katrinka Blazing 05/08/19  She has a hx of AAA, right bundle branch block, exertional dyspnea, chest discomfort, diaphoresis, three-vessel coronary calcification by CT 2019, and near syncope.  She is here today for evaluation of shortness of breath on exertion.  It is associated with diaphoresis, dizziness, and fatigue.  When she sits and rests, symptoms go away.  She has no orthopnea or PND.  There is no associated chest discomfort.  She does have cough.  She does have a diagnosis of COPD.  She stopped smoking over 10 years ago.  She has not had lower extremity swelling.  She does complain of occasional episodes of left parasternal, localized discomfort that can last seconds to less than 10 minutes.  These episodes are not precipitated by activity.  These episodes are not associated with the shortness of breath  Cardiac CTA was arranged  HLD with increase lipitor  Chronic RBBB.   Cardiac CTA with significant stenosis in prox LCX and possible RCA/  Dr. Katrinka Blazing reviewed and plan for cardiac cath - pt would prefer 07/07/19 with Dr. Katrinka Blazing.   Saw Dr. Suzan Garibaldi and her small AAA was stable.  4.0 cm  Today we reviewed her CAD answered her questions.   Past Medical History:  Diagnosis Date  . AAA (abdominal aortic aneurysm) (HCC)   . Allergic rhinitis   . Chronic hoarseness   . Colon polyps   . COPD (chronic obstructive pulmonary disease) (HCC)    Dr. Marchelle Gearing  . Dyslipidemia   . Hypertension   . Hypothyroidism   . MS (multiple  sclerosis) (HCC)   . Obesity   . Stomach ulcer from aspirin/ibuprofen-like drugs (NSAID's)     Past Surgical History:  Procedure Laterality Date  . ABDOMINAL HYSTERECTOMY    . BARIATRIC SURGERY  09/07/08   lap band  . CARPAL TUNNEL RELEASE    . CATARACT EXTRACTION Right 11/08/2017  . CATARACT EXTRACTION Left 11/22/2017  . CATARACT EXTRACTION, BILATERAL      Toric lenses for astigmatism  . CHOLECYSTECTOMY    . CHOLECYSTECTOMY    . HIATAL HERNIA REPAIR  09/07/08     Current Outpatient Medications  Medication Sig Dispense Refill  . atorvastatin (LIPITOR) 40 MG tablet Take 1 tablet (40 mg total) by mouth daily. 90 tablet 3  . gabapentin (NEURONTIN) 300 MG capsule Take 2 capsules by mouth daily.    Marland Kitchen HYDROcodone-acetaminophen (NORCO/VICODIN) 5-325 MG per tablet Take 1 tablet by mouth as needed for moderate pain.     . Interferon Beta-1b (BETASERON) 0.3 MG KIT injection RECONSTITUTE AS DIRECTED AND INJECT 0.25MG  (= ) SUBCUTANEOUSLY ONCE EVERY OTHER DAY AS DIRECTED. 42 each 3  . levothyroxine (SYNTHROID) 112 MCG tablet TAKE 1 TABLET (112 MCG TOTAL) BY MOUTH DAILY BEFORE BREAKFAST. 90 tablet 0  . mometasone (NASONEX) 50 MCG/ACT nasal spray Place 2 sprays into the nose daily as needed. 16 g 5  .  Polyethyl Glycol-Propyl Glycol (SYSTANE ULTRA OP) Apply 1 drop to eye as needed (Right Eye for dry eyes).    . sertraline (ZOLOFT) 50 MG tablet TAKE 1 TABLET BY MOUTH EVERY DAY 90 tablet 1  . telmisartan-hydrochlorothiazide (MICARDIS HCT) 40-12.5 MG tablet TAKE 1 TABLET BY MOUTH EVERY DAY 90 tablet 3  . traMADol (ULTRAM) 50 MG tablet Take 1 tablet (50 mg total) by mouth every 6 (six) hours as needed. 30 tablet 0  . triamcinolone ointment (KENALOG) 0.1 % APPLY TO AFFECTED AREA MONDAYS, WEDNESDAYS AND FRIDAYS  5  . aspirin EC 81 MG tablet Take 1 tablet (81 mg total) by mouth daily. 90 tablet 3   No current facility-administered medications for this visit.    Allergies:   Codeine, Hydromorphone hcl,  Ibuprofen, Morphine sulfate, and Metronidazole    Social History:  The patient  reports that she quit smoking about 12 years ago. Her smoking use included cigarettes. She has a 80.00 pack-year smoking history. She has never used smokeless tobacco. She reports that she does not drink alcohol or use drugs.   Family History:  The patient's family history includes AAA (abdominal aortic aneurysm) in her father and sister; Arthritis in her sister; COPD in her father and sister; Dementia in her sister; Heart disease in her father and mother; Other in her sister; Stroke (age of onset: 57) in her mother.    ROS:  General:no colds or fevers, no weight changes Skin:no rashes or ulcers HEENT:no blurred vision, no congestion CV:see HPI PUL:see HPI GI:no diarrhea constipation or melena, no indigestion GU:no hematuria, no dysuria MS:no joint pain, no claudication Neuro:no syncope, no lightheadedness Endo:no diabetes, no thyroid disease Wt Readings from Last 3 Encounters:  07/03/19 248 lb (112.5 kg)  05/14/19 248 lb 3.2 oz (112.6 kg)  05/08/19 249 lb 6.4 oz (113.1 kg)     PHYSICAL EXAM: VS:  BP 102/72   Pulse 62   Ht 5\' 6"  (1.676 m)   Wt 248 lb (112.5 kg)   SpO2 95%   BMI 40.03 kg/m  , BMI Body mass index is 40.03 kg/m. General:Pleasant affect, NAD Skin:Warm and dry, brisk capillary refill HEENT:normocephalic, sclera clear, mucus membranes moist Neck:supple, no JVD, no bruits  Heart:S1S2 RRR without murmur, gallup, rub or click Lungs:clear without rales, rhonchi, or wheezes OAC:ZYSA, non tender, + BS, do not palpate liver spleen or masses Ext:no lower ext edema, 2+ pedal pulses, 2+ radial pulses Neuro:alert and oriented, MAE, follows commands, + facial symmetry    EKG:  EKG is ordered today. The ekg ordered today demonstrates SR at 62 no acute ST changes, stable EKG   Recent Labs: 10/22/2018: Hemoglobin 14.6; Platelets 139.0 04/21/2019: TSH 0.82 05/08/2019: NT-Pro BNP  304 06/24/2019: ALT 13; BUN 14; Creatinine, Ser 1.03; Potassium 4.4; Sodium 136    Lipid Panel    Component Value Date/Time   CHOL 146 06/24/2019 0735   TRIG 117 06/24/2019 0735   HDL 47 06/24/2019 0735   CHOLHDL 3.1 06/24/2019 0735   CHOLHDL 4 04/21/2019 1036   VLDL 28.2 04/21/2019 1036   LDLCALC 78 06/24/2019 0735       Other studies Reviewed: Additional studies/ records that were reviewed today include: .  Cardiac CTA 06/28/19 Aorta: Normal size. Ascending aorta 2.8 cm. Mild calcification of the aortic root. Moderate atherosclerosis of the descending aorta. No dissection.  Aortic Valve:  Trileaflet.  No calcifications.  Coronary Arteries:  Normal coronary origin.  Right dominance.  RCA is a large dominant  artery that gives rise to PDA and PLVB. Diffuse calcification with minimal(<25%) obstruction proximally and moderate (50-69%) mixed plaque in the mid RCA. Minimal distal RCA plaque.  Left main is a large artery that gives rise to LAD and LCX arteries. There is minimal (<25%) calcified plaque in the distal LM.  LAD is a large vessel that is heavily calcified. There is mild (25-49%) calcified plaque proximally. The mid LAD is heavily calcified with moderate (50-69%) calcified plaque followed by moderate mixed plaque. There is mild (25-49%) mixed plaque distally. Small D1 and D2 that are heavily calcified. Moderate calcified plaque.  LCX is a non-dominant artery. There is minimal (<25%) calcified plaque in the ostium and severe (>70%) mixed plaque just proximal to OM1. Minimal calcified plaque in the mid LCX. Small OM1 and OM2 without significant disease.  Other findings:  Normal pulmonary vein drainage into the left atrium.  Normal let atrial appendage without a thrombus.  Normal size of the pulmonary artery.  IMPRESSION: 1. Coronary calcium score of 1106. This was 98th percentile for age and sex matched control.  2. Normal coronary origin  with right dominance.  3.  Heavily calcified coronary arteries.  4. There appears to be obstructive disease in the proximal LCX and possibly in D2. D2 is a small vessel best treated medically.  5.  Will send study for FFRct.  6.  Moderate atherosclerosis of the descending aorta.  IMPRESSION: 1. CT FFR analysis showed significant stenosis in the proximal LCX and possibly significant stenosis in the mid to distal LAD.  2.  Coronary calcium score 1106, 98th percentile for age and gender.  3.  Recommend cardiac catheterization for further evaluation.  ASSESSMENT AND PLAN:  1.  crescendo angina with abnormal cardiac CTA with > 70% stenosis in LCX.  Plan for cardiac cath and probable PCI, discussed at length at least 45 min.  Pt with excellent questions about procedure.   All questions answered.  She will begin ASA 81 mg daily.  Unable to ad BB with systolic BP 102.   *pt wishes to have post hospital meds from her pharmacy, otherwise meds are too expensive* Pt wishes to take her home meds and not meds provided by hospital if she stays overnight.    The patient understands that risks included but are not limited to stroke (1 in 1000), death (1 in 1000), kidney failure [usually temporary] (1 in 500), bleeding (1 in 200), allergic reaction [possibly serious] (1 in 200).   2.  AAA, followed by Dr. Arbie Cookey last evaluated 06/2019.   3.  Hx RBBB  4.  HLD on statin. Last LDL 116 will need to increase lipitor to 80 mg per day, pt is worried it will increase her muscle pain.  TG 117, HDL 47  Will hold at 40 until cath.    5.  Multiple sclerosis followed by neurology   6.  Time for screening colonoscopy, discussed would not be able to do for at least 6 months and perhaps a year due to need to stop ASA and antiplatelet.    7.  HTN controlled    Current medicines are reviewed with the patient today.  The patient Has no concerns regarding medicines.  The following changes have been made:   See above Labs/ tests ordered today include:see above  Disposition:   FU:  see above  Signed, Nada Boozer, NP  07/03/2019 3:03 PM    [facility] [address] [place], [place], [place]  [address]/ [address] [place], [place] Phone: [phone]; Fax: [phone]  [phone]

## 2019-07-02 NOTE — H&P (View-Only) (Signed)
Cardiology Office Note   Date:  07/03/2019   ID:  Cassandra Rios, DOB [birth year], MRN [id number]  PCP:  Cassandra Schultz, DO  Cardiologist:  Dr. Katrinka Rios     Chief Complaint  Patient presents with  . Shortness of Breath    and abnormal cardiac CTA       History of Present Illness: Cassandra Rios is a 69 y.o. female who presents for angina, with DOE, diaphoresis, dizziness, at times feels she could pass out but has not.  With rest resolves.     Last saw Dr. Katrinka Rios 05/08/19  She has a hx of AAA, right bundle branch block, exertional dyspnea, chest discomfort, diaphoresis, three-vessel coronary calcification by CT 2019, and near syncope.  She is here today for evaluation of shortness of breath on exertion.  It is associated with diaphoresis, dizziness, and fatigue.  When she sits and rests, symptoms go away.  She has no orthopnea or PND.  There is no associated chest discomfort.  She does have cough.  She does have a diagnosis of COPD.  She stopped smoking over 10 years ago.  She has not had lower extremity swelling.  She does complain of occasional episodes of left parasternal, localized discomfort that can last seconds to less than 10 minutes.  These episodes are not precipitated by activity.  These episodes are not associated with the shortness of breath  Cardiac CTA was arranged  HLD with increase lipitor  Chronic RBBB.   Cardiac CTA with significant stenosis in prox LCX and possible RCA/  Dr. Katrinka Rios reviewed and plan for cardiac cath - pt would prefer 07/07/19 with Dr. Katrinka Rios.   Saw Cassandra Rios and her small AAA was stable.  4.0 cm  Today we reviewed her CAD answered her questions.   Past Medical History:  Diagnosis Date  . AAA (abdominal aortic aneurysm) (HCC)   . Allergic rhinitis   . Chronic hoarseness   . Colon polyps   . COPD (chronic obstructive pulmonary disease) (HCC)    Cassandra Rios  . Dyslipidemia   . Hypertension   . Hypothyroidism   . MS (multiple  sclerosis) (HCC)   . Obesity   . Stomach ulcer from aspirin/ibuprofen-like drugs (NSAID's)     Past Surgical History:  Procedure Laterality Date  . ABDOMINAL HYSTERECTOMY    . BARIATRIC SURGERY  09/07/08   lap band  . CARPAL TUNNEL RELEASE    . CATARACT EXTRACTION Right 11/08/2017  . CATARACT EXTRACTION Left 11/22/2017  . CATARACT EXTRACTION, BILATERAL      Toric lenses for astigmatism  . CHOLECYSTECTOMY    . CHOLECYSTECTOMY    . HIATAL HERNIA REPAIR  09/07/08     Current Outpatient Medications  Medication Sig Dispense Refill  . atorvastatin (LIPITOR) 40 MG tablet Take 1 tablet (40 mg total) by mouth daily. 90 tablet 3  . gabapentin (NEURONTIN) 300 MG capsule Take 2 capsules by mouth daily.    Marland Kitchen HYDROcodone-acetaminophen (NORCO/VICODIN) 5-325 MG per tablet Take 1 tablet by mouth as needed for moderate pain.     . Interferon Beta-1b (BETASERON) 0.3 MG KIT injection RECONSTITUTE AS DIRECTED AND INJECT 0.25MG  (= ) SUBCUTANEOUSLY ONCE EVERY OTHER DAY AS DIRECTED. 42 each 3  . levothyroxine (SYNTHROID) 112 MCG tablet TAKE 1 TABLET (112 MCG TOTAL) BY MOUTH DAILY BEFORE BREAKFAST. 90 tablet 0  . mometasone (NASONEX) 50 MCG/ACT nasal spray Place 2 sprays into the nose daily as needed. 16 g 5  .  Polyethyl Glycol-Propyl Glycol (SYSTANE ULTRA OP) Apply 1 drop to eye as needed (Right Eye for dry eyes).    . sertraline (ZOLOFT) 50 MG tablet TAKE 1 TABLET BY MOUTH EVERY DAY 90 tablet 1  . telmisartan-hydrochlorothiazide (MICARDIS HCT) 40-12.5 MG tablet TAKE 1 TABLET BY MOUTH EVERY DAY 90 tablet 3  . traMADol (ULTRAM) 50 MG tablet Take 1 tablet (50 mg total) by mouth every 6 (six) hours as needed. 30 tablet 0  . triamcinolone ointment (KENALOG) 0.1 % APPLY TO AFFECTED AREA MONDAYS, WEDNESDAYS AND FRIDAYS  5  . aspirin EC 81 MG tablet Take 1 tablet (81 mg total) by mouth daily. 90 tablet 3   No current facility-administered medications for this visit.    Allergies:   Codeine, Hydromorphone hcl,  Ibuprofen, Morphine sulfate, and Metronidazole    Social History:  The patient  reports that she quit smoking about 12 years ago. Her smoking use included cigarettes. She has a 80.00 pack-year smoking history. She has never used smokeless tobacco. She reports that she does not drink alcohol or use drugs.   Family History:  The patient's family history includes AAA (abdominal aortic aneurysm) in her father and sister; Arthritis in her sister; COPD in her father and sister; Dementia in her sister; Heart disease in her father and mother; Other in her sister; Stroke (age of onset: 57) in her mother.    ROS:  General:no colds or fevers, no weight changes Skin:no rashes or ulcers HEENT:no blurred vision, no congestion CV:see HPI PUL:see HPI GI:no diarrhea constipation or melena, no indigestion GU:no hematuria, no dysuria MS:no joint pain, no claudication Neuro:no syncope, no lightheadedness Endo:no diabetes, no thyroid disease Wt Readings from Last 3 Encounters:  07/03/19 248 lb (112.5 kg)  05/14/19 248 lb 3.2 oz (112.6 kg)  05/08/19 249 lb 6.4 oz (113.1 kg)     PHYSICAL EXAM: VS:  BP 102/72   Pulse 62   Ht 5\' 6"  (1.676 m)   Wt 248 lb (112.5 kg)   SpO2 95%   BMI 40.03 kg/m  , BMI Body mass index is 40.03 kg/m. General:Pleasant affect, NAD Skin:Warm and dry, brisk capillary refill HEENT:normocephalic, sclera clear, mucus membranes moist Neck:supple, no JVD, no bruits  Heart:S1S2 RRR without murmur, gallup, rub or click Lungs:clear without rales, rhonchi, or wheezes OAC:ZYSA, non tender, + BS, do not palpate liver spleen or masses Ext:no lower ext edema, 2+ pedal pulses, 2+ radial pulses Neuro:alert and oriented, MAE, follows commands, + facial symmetry    EKG:  EKG is ordered today. The ekg ordered today demonstrates SR at 62 no acute ST changes, stable EKG   Recent Labs: 10/22/2018: Hemoglobin 14.6; Platelets 139.0 04/21/2019: TSH 0.82 05/08/2019: NT-Pro BNP  304 06/24/2019: ALT 13; BUN 14; Creatinine, Ser 1.03; Potassium 4.4; Sodium 136    Lipid Panel    Component Value Date/Time   CHOL 146 06/24/2019 0735   TRIG 117 06/24/2019 0735   HDL 47 06/24/2019 0735   CHOLHDL 3.1 06/24/2019 0735   CHOLHDL 4 04/21/2019 1036   VLDL 28.2 04/21/2019 1036   LDLCALC 78 06/24/2019 0735       Other studies Reviewed: Additional studies/ records that were reviewed today include: .  Cardiac CTA 06/28/19 Aorta: Normal size. Ascending aorta 2.8 cm. Mild calcification of the aortic root. Moderate atherosclerosis of the descending aorta. No dissection.  Aortic Valve:  Trileaflet.  No calcifications.  Coronary Arteries:  Normal coronary origin.  Right dominance.  RCA is a large dominant  artery that gives rise to PDA and PLVB. Diffuse calcification with minimal(<25%) obstruction proximally and moderate (50-69%) mixed plaque in the mid RCA. Minimal distal RCA plaque.  Left main is a large artery that gives rise to LAD and LCX arteries. There is minimal (<25%) calcified plaque in the distal LM.  LAD is a large vessel that is heavily calcified. There is mild (25-49%) calcified plaque proximally. The mid LAD is heavily calcified with moderate (50-69%) calcified plaque followed by moderate mixed plaque. There is mild (25-49%) mixed plaque distally. Small D1 and D2 that are heavily calcified. Moderate calcified plaque.  LCX is a non-dominant artery. There is minimal (<25%) calcified plaque in the ostium and severe (>70%) mixed plaque just proximal to OM1. Minimal calcified plaque in the mid LCX. Small OM1 and OM2 without significant disease.  Other findings:  Normal pulmonary vein drainage into the left atrium.  Normal let atrial appendage without a thrombus.  Normal size of the pulmonary artery.  IMPRESSION: 1. Coronary calcium score of 1106. This was 98th percentile for age and sex matched control.  2. Normal coronary origin  with right dominance.  3.  Heavily calcified coronary arteries.  4. There appears to be obstructive disease in the proximal LCX and possibly in D2. D2 is a small vessel best treated medically.  5.  Will send study for FFRct.  6.  Moderate atherosclerosis of the descending aorta.  IMPRESSION: 1. CT FFR analysis showed significant stenosis in the proximal LCX and possibly significant stenosis in the mid to distal LAD.  2.  Coronary calcium score 1106, 98th percentile for age and gender.  3.  Recommend cardiac catheterization for further evaluation.  ASSESSMENT AND PLAN:  1.  crescendo angina with abnormal cardiac CTA with > 70% stenosis in LCX.  Plan for cardiac cath and probable PCI, discussed at length at least 45 min.  Pt with excellent questions about procedure.   All questions answered.  She will begin ASA 81 mg daily.  Unable to ad BB with systolic BP 102.   *pt wishes to have post hospital meds from her pharmacy, otherwise meds are too expensive* Pt wishes to take her home meds and not meds provided by hospital if she stays overnight.    The patient understands that risks included but are not limited to stroke (1 in 1000), death (1 in 1000), kidney failure [usually temporary] (1 in 500), bleeding (1 in 200), allergic reaction [possibly serious] (1 in 200).   2.  AAA, followed by Dr. Arbie Cookey last evaluated 06/2019.   3.  Hx RBBB  4.  HLD on statin. Last LDL 116 will need to increase lipitor to 80 mg per day, pt is worried it will increase her muscle pain.  TG 117, HDL 47  Will hold at 40 until cath.    5.  Multiple sclerosis followed by neurology   6.  Time for screening colonoscopy, discussed would not be able to do for at least 6 months and perhaps a year due to need to stop ASA and antiplatelet.    7.  HTN controlled    Current medicines are reviewed with the patient today.  The patient Has no concerns regarding medicines.  The following changes have been made:   See above Labs/ tests ordered today include:see above  Disposition:   FU:  see above  Signed, Nada Boozer, NP  07/03/2019 3:03 PM    [facility] [address] [place], [place], [place]  [address]/ [address] [place], [place] Phone: [phone]; Fax: [phone]  [phone]

## 2019-07-03 ENCOUNTER — Other Ambulatory Visit: Payer: Self-pay

## 2019-07-03 ENCOUNTER — Ambulatory Visit (INDEPENDENT_AMBULATORY_CARE_PROVIDER_SITE_OTHER): Payer: PPO | Admitting: Cardiology

## 2019-07-03 ENCOUNTER — Encounter: Payer: Self-pay | Admitting: Cardiology

## 2019-07-03 VITALS — BP 102/72 | HR 62 | Ht 66.0 in | Wt 248.0 lb

## 2019-07-03 DIAGNOSIS — E785 Hyperlipidemia, unspecified: Secondary | ICD-10-CM

## 2019-07-03 DIAGNOSIS — I4510 Unspecified right bundle-branch block: Secondary | ICD-10-CM | POA: Diagnosis not present

## 2019-07-03 DIAGNOSIS — G35 Multiple sclerosis: Secondary | ICD-10-CM | POA: Diagnosis not present

## 2019-07-03 DIAGNOSIS — I2510 Atherosclerotic heart disease of native coronary artery without angina pectoris: Secondary | ICD-10-CM

## 2019-07-03 DIAGNOSIS — I10 Essential (primary) hypertension: Secondary | ICD-10-CM

## 2019-07-03 DIAGNOSIS — I714 Abdominal aortic aneurysm, without rupture, unspecified: Secondary | ICD-10-CM

## 2019-07-03 DIAGNOSIS — I209 Angina pectoris, unspecified: Secondary | ICD-10-CM

## 2019-07-03 DIAGNOSIS — I2584 Coronary atherosclerosis due to calcified coronary lesion: Secondary | ICD-10-CM | POA: Diagnosis not present

## 2019-07-03 MED ORDER — ASPIRIN EC 81 MG PO TBEC: 81.0000 mg | DELAYED_RELEASE_TABLET | Freq: Every day | ORAL | 3 refills | Status: DC

## 2019-07-03 NOTE — Patient Instructions (Addendum)
Medication Instructions:  Your physician has recommended you make the following change in your medication:  1.  START Aspirin 81 mg daily  *If you need a refill on your cardiac medications before your next appointment, please call your pharmacy*  Lab Work: TODAY:  BMET & CBC  If you have labs (blood work) drawn today and your tests are completely normal, you will receive your results only by: Marland Kitchen MyChart Message (if you have MyChart) OR . A paper copy in the mail If you have any lab test that is abnormal or we need to change your treatment, we will call you to review the results.  Testing/Procedures:    [facility] OFFICE [place], [address] [place] [address] Dept: [phone] Loc: [place]  07/03/2019  You are scheduled for a Cardiac Catheterization on Wednesday, February 3 with Dr. Daneen Schick.  1. Please arrive at the [facility] (Main Entrance A) at [facility]: [address] [place], [place] [address] at 9:30 AM (This time is two hours before your procedure to ensure your preparation). Free valet parking service is available.   Special note: Every effort is made to have your procedure done on time. Please understand that emergencies sometimes delay scheduled procedures.  2. Diet: Do not eat solid foods after midnight.  The patient may have clear liquids until 5am upon the day of the procedure.  3. Labs: You will need to have blood drawn on TODAY  4. Medication instructions in preparation for your procedure:  Stop taking, TELMISARTAN-HYDROCHLOROTHIAZIDE Wednesday, February 3, HOLD ALL OF YOUR MEDICATIONS ON THE MORNING OF EXCEPT ASPIRIN 81 MG.  PER LAURA INGOLD, NP    Contrast Allergy: No  On the morning of your procedure, take your Aspirin and any morning medicines NOT listed above.  You may use sips of water.  5. Plan for one night stay--bring  personal belongings. 6. Bring a current list of your medications and current insurance cards. 7. You MUST have a responsible person to drive you home. 8. Someone MUST be with you the first 24 hours after you arrive home or your discharge will be delayed. 9. Please wear clothes that are easy to get on and off and wear slip-on shoes.  YOU WILL NEED TO GO TO [address] [place] , [place] FOR COVID TEST. PLEASE ARRIVE THERE 07/07/2019 AT 2:10.  ONCE YOU HAVE BEEN SWABBED, YOU WILL BE REQUIRED TO GO STRAIGHT HOME AND QUARANTINE UNTIL YOU ARE ON YOUR WAY TO YOUR PROCEDURE.  NO OUTSIDE VISITORS IN YOUR HOM DURING THIS TIME, OTHER THAN THE ONES THAT LIVE WITH YOU.   Thank you for allowing [name] to care for you!   -- [facility]   Follow-Up: At [facility], you and your health needs are our priority.  As part of our continuing mission to provide you with exceptional heart care, we have created designated Provider Care Teams.  These Care Teams include your primary Cardiologist (physician) and Advanced Practice Providers (APPs -  Physician Assistants and Nurse Practitioners) who all work together to provide you with the care you need, when you need it.  Your next appointment:   07/23/2019 ARRIVE AT 2:00 TO SEE Truitt Merle, NP  The format for your next appointment:   In Person  Provider:   You may see Dr. Tamala Julian or one of the following Advanced Practice Providers on  your designated Care Team:    Truitt Merle, NP  Cecilie Kicks, NP  Kathyrn Drown, NP

## 2019-07-04 LAB — CBC
Hematocrit: 42 % (ref 34.0–46.6)
Hemoglobin: 14.1 g/dL (ref 11.1–15.9)
MCH: 29.7 pg (ref 26.6–33.0)
MCHC: 33.6 g/dL (ref 31.5–35.7)
MCV: 88 fL (ref 79–97)
Platelets: 143 10*3/uL — ABNORMAL LOW (ref 150–450)
RBC: 4.75 x10E6/uL (ref 3.77–5.28)
RDW: 12.1 % (ref 11.7–15.4)
WBC: 6.4 10*3/uL (ref 3.4–10.8)

## 2019-07-04 LAB — BASIC METABOLIC PANEL
BUN/Creatinine Ratio: 12 (ref 12–28)
BUN: 12 mg/dL (ref 8–27)
CO2: 25 mmol/L (ref 20–29)
Calcium: 9.2 mg/dL (ref 8.7–10.3)
Chloride: 100 mmol/L (ref 96–106)
Creatinine, Ser: 0.99 mg/dL (ref 0.57–1.00)
GFR calc Af Amer: 67 mL/min/{1.73_m2} (ref >59)
GFR calc non Af Amer: 58 mL/min/{1.73_m2} — ABNORMAL LOW (ref >59)
Glucose: 108 mg/dL — ABNORMAL HIGH (ref 65–99)
Potassium: 3.9 mmol/L (ref 3.5–5.2)
Sodium: 138 mmol/L (ref 134–144)

## 2019-07-07 ENCOUNTER — Other Ambulatory Visit (HOSPITAL_COMMUNITY)
Admission: RE | Admit: 2019-07-07 | Discharge: 2019-07-07 | Disposition: A | Discharge status: Home / Self Care | Payer: PPO | Source: Ambulatory Visit | Attending: Interventional Cardiology | Admitting: Interventional Cardiology

## 2019-07-07 DIAGNOSIS — Z01812 Encounter for preprocedural laboratory examination: Secondary | ICD-10-CM | POA: Diagnosis not present

## 2019-07-07 DIAGNOSIS — Z20822 Contact with and (suspected) exposure to covid-19: Secondary | ICD-10-CM | POA: Diagnosis not present

## 2019-07-08 ENCOUNTER — Telehealth: Payer: Self-pay | Admitting: Adult Health

## 2019-07-08 ENCOUNTER — Telehealth: Payer: Self-pay | Admitting: *Deleted

## 2019-07-08 LAB — SARS CORONAVIRUS 2 (TAT 6-24 HRS): SARS Coronavirus 2: NEGATIVE

## 2019-07-08 NOTE — Telephone Encounter (Signed)
Pt is asking for a call from RN to discuss the financial assistance for her Interferon Beta-1b (BETASERON) 0.3 MG KIT injection

## 2019-07-08 NOTE — Telephone Encounter (Signed)
Attempted to contact patient to review procedure instructions, call would not complete.

## 2019-07-08 NOTE — Telephone Encounter (Signed)
Pt contacted pre-catheterization scheduled at [facility] for: Wednesday July 09, 2019 11:30 AM Verified arrival time and place: [facility]) at: 9:30 AM   No solid food after midnight prior to cath, clear liquids until 5 AM day of procedure. Contrast allergy: no  Hold: Telmisartan-HCTZ-AM of procedure  Except hold medications AM meds can be  taken pre-cath with sip of water including: ASA 81 mg   Confirmed patient has responsible adult to drive home post procedure and observe 24 hours after arriving home:   Currently, due to Covid-19 pandemic, only one person will be allowed with patient. Must be the same person for patient's entire stay and will be required to wear a mask. They will be asked to wait in the waiting room for the duration of the patient's stay.  Patients are required to wear a mask when they enter the hospital.  Attempted to contact patient to review procedure instructions, call would not complete.

## 2019-07-09 ENCOUNTER — Encounter (HOSPITAL_COMMUNITY)
Admission: RE | Disposition: A | Discharge status: Home / Self Care | Payer: Self-pay | Source: Home / Self Care | Attending: Interventional Cardiology

## 2019-07-09 ENCOUNTER — Other Ambulatory Visit: Payer: Self-pay

## 2019-07-09 ENCOUNTER — Ambulatory Visit (HOSPITAL_COMMUNITY)
Admission: RE | Admit: 2019-07-09 | Discharge: 2019-07-09 | Disposition: A | Discharge status: Home / Self Care | Payer: PPO | Attending: Interventional Cardiology | Admitting: Interventional Cardiology

## 2019-07-09 DIAGNOSIS — I25118 Atherosclerotic heart disease of native coronary artery with other forms of angina pectoris: Secondary | ICD-10-CM | POA: Diagnosis not present

## 2019-07-09 DIAGNOSIS — J449 Chronic obstructive pulmonary disease, unspecified: Secondary | ICD-10-CM | POA: Insufficient documentation

## 2019-07-09 DIAGNOSIS — I4510 Unspecified right bundle-branch block: Secondary | ICD-10-CM | POA: Insufficient documentation

## 2019-07-09 DIAGNOSIS — Z7982 Long term (current) use of aspirin: Secondary | ICD-10-CM | POA: Insufficient documentation

## 2019-07-09 DIAGNOSIS — Z885 Allergy status to narcotic agent status: Secondary | ICD-10-CM | POA: Diagnosis not present

## 2019-07-09 DIAGNOSIS — Z8249 Family history of ischemic heart disease and other diseases of the circulatory system: Secondary | ICD-10-CM | POA: Insufficient documentation

## 2019-07-09 DIAGNOSIS — E039 Hypothyroidism, unspecified: Secondary | ICD-10-CM | POA: Diagnosis not present

## 2019-07-09 DIAGNOSIS — Z6841 Body Mass Index (BMI) 40.0 and over, adult: Secondary | ICD-10-CM | POA: Insufficient documentation

## 2019-07-09 DIAGNOSIS — Z881 Allergy status to other antibiotic agents status: Secondary | ICD-10-CM | POA: Diagnosis not present

## 2019-07-09 DIAGNOSIS — Z79890 Hormone replacement therapy (postmenopausal): Secondary | ICD-10-CM | POA: Diagnosis not present

## 2019-07-09 DIAGNOSIS — Z87891 Personal history of nicotine dependence: Secondary | ICD-10-CM | POA: Diagnosis not present

## 2019-07-09 DIAGNOSIS — J209 Acute bronchitis, unspecified: Secondary | ICD-10-CM | POA: Diagnosis present

## 2019-07-09 DIAGNOSIS — Z886 Allergy status to analgesic agent status: Secondary | ICD-10-CM | POA: Diagnosis not present

## 2019-07-09 DIAGNOSIS — E669 Obesity, unspecified: Secondary | ICD-10-CM | POA: Diagnosis not present

## 2019-07-09 DIAGNOSIS — G35 Multiple sclerosis: Secondary | ICD-10-CM | POA: Diagnosis not present

## 2019-07-09 DIAGNOSIS — I714 Abdominal aortic aneurysm, without rupture: Secondary | ICD-10-CM | POA: Diagnosis not present

## 2019-07-09 DIAGNOSIS — D696 Thrombocytopenia, unspecified: Secondary | ICD-10-CM | POA: Diagnosis present

## 2019-07-09 DIAGNOSIS — G35D Multiple sclerosis, unspecified: Secondary | ICD-10-CM | POA: Diagnosis present

## 2019-07-09 DIAGNOSIS — I2510 Atherosclerotic heart disease of native coronary artery without angina pectoris: Secondary | ICD-10-CM

## 2019-07-09 DIAGNOSIS — E785 Hyperlipidemia, unspecified: Secondary | ICD-10-CM | POA: Diagnosis present

## 2019-07-09 DIAGNOSIS — I10 Essential (primary) hypertension: Secondary | ICD-10-CM | POA: Insufficient documentation

## 2019-07-09 DIAGNOSIS — I25110 Atherosclerotic heart disease of native coronary artery with unstable angina pectoris: Secondary | ICD-10-CM | POA: Insufficient documentation

## 2019-07-09 DIAGNOSIS — Z79899 Other long term (current) drug therapy: Secondary | ICD-10-CM | POA: Insufficient documentation

## 2019-07-09 DIAGNOSIS — Z7951 Long term (current) use of inhaled steroids: Secondary | ICD-10-CM | POA: Diagnosis not present

## 2019-07-09 HISTORY — PX: LEFT HEART CATH AND CORONARY ANGIOGRAPHY: CATH118249

## 2019-07-09 SURGERY — LEFT HEART CATH AND CORONARY ANGIOGRAPHY
Anesthesia: LOCAL

## 2019-07-09 MED ORDER — SODIUM CHLORIDE 0.9% FLUSH: 3.0000 mL | Freq: Two times a day (BID) | INTRAVENOUS | Status: DC

## 2019-07-09 MED ORDER — LIDOCAINE HCL (PF) 1 % IJ SOLN
INTRAMUSCULAR | Status: DC | PRN
  Administered 2019-07-09: 3 mL

## 2019-07-09 MED ORDER — LIDOCAINE HCL (PF) 1 % IJ SOLN
INTRAMUSCULAR | Status: AC
  Filled 2019-07-09: qty 30

## 2019-07-09 MED ORDER — VERAPAMIL HCL 2.5 MG/ML IV SOLN
INTRAVENOUS | Status: DC | PRN
  Administered 2019-07-09: 11:00:00 10 mL via INTRA_ARTERIAL

## 2019-07-09 MED ORDER — OXYCODONE HCL 5 MG PO TABS: 5.0000 mg | ORAL_TABLET | ORAL | Status: DC | PRN

## 2019-07-09 MED ORDER — SODIUM CHLORIDE 0.9% FLUSH: 3.0000 mL | INTRAVENOUS | Status: DC | PRN

## 2019-07-09 MED ORDER — HEPARIN (PORCINE) IN NACL 1000-0.9 UT/500ML-% IV SOLN
INTRAVENOUS | Status: DC | PRN
  Administered 2019-07-09: 500 mL

## 2019-07-09 MED ORDER — HEPARIN SODIUM (PORCINE) 1000 UNIT/ML IJ SOLN
INTRAMUSCULAR | Status: AC
  Filled 2019-07-09: qty 1

## 2019-07-09 MED ORDER — ACETAMINOPHEN 325 MG PO TABS: 650.0000 mg | ORAL_TABLET | ORAL | Status: DC | PRN

## 2019-07-09 MED ORDER — SODIUM CHLORIDE 0.9 % IV SOLN: INTRAVENOUS | Status: DC

## 2019-07-09 MED ORDER — SODIUM CHLORIDE 0.9 % IV SOLN: 250.0000 mL | INTRAVENOUS | Status: DC | PRN

## 2019-07-09 MED ORDER — FENTANYL CITRATE (PF) 100 MCG/2ML IJ SOLN
INTRAMUSCULAR | Status: DC | PRN
  Administered 2019-07-09: 25 ug via INTRAVENOUS

## 2019-07-09 MED ORDER — ATORVASTATIN CALCIUM 40 MG PO TABS: 80.0000 mg | ORAL_TABLET | Freq: Every day | ORAL | 3 refills | Status: DC

## 2019-07-09 MED ORDER — MIDAZOLAM HCL 2 MG/2ML IJ SOLN
INTRAMUSCULAR | Status: AC
  Filled 2019-07-09: qty 2

## 2019-07-09 MED ORDER — FENTANYL CITRATE (PF) 100 MCG/2ML IJ SOLN
INTRAMUSCULAR | Status: AC
  Filled 2019-07-09: qty 2

## 2019-07-09 MED ORDER — HEPARIN SODIUM (PORCINE) 1000 UNIT/ML IJ SOLN
INTRAMUSCULAR | Status: DC | PRN
  Administered 2019-07-09: 6000 [IU] via INTRAVENOUS

## 2019-07-09 MED ORDER — ONDANSETRON HCL 4 MG/2ML IJ SOLN: 4.0000 mg | Freq: Four times a day (QID) | INTRAMUSCULAR | Status: DC | PRN

## 2019-07-09 MED ORDER — HEPARIN (PORCINE) IN NACL 2000-0.9 UNIT/L-% IV SOLN
INTRAVENOUS | Status: AC
  Filled 2019-07-09: qty 1000

## 2019-07-09 MED ORDER — MIDAZOLAM HCL 2 MG/2ML IJ SOLN
INTRAMUSCULAR | Status: DC | PRN
  Administered 2019-07-09 (×2): 1 mg via INTRAVENOUS

## 2019-07-09 MED ORDER — LABETALOL HCL 5 MG/ML IV SOLN: 10.0000 mg | INTRAVENOUS | Status: DC | PRN

## 2019-07-09 MED ORDER — HYDRALAZINE HCL 20 MG/ML IJ SOLN: 10.0000 mg | INTRAMUSCULAR | Status: DC | PRN

## 2019-07-09 MED ORDER — SODIUM CHLORIDE 0.9 % WEIGHT BASED INFUSION: 1.0000 mL/kg/h | INTRAVENOUS | Status: DC

## 2019-07-09 MED ORDER — ASPIRIN 81 MG PO CHEW: 81.0000 mg | CHEWABLE_TABLET | ORAL | Status: DC

## 2019-07-09 MED ORDER — SODIUM CHLORIDE 0.9 % WEIGHT BASED INFUSION
3.0000 mL/kg/h | INTRAVENOUS | Status: DC
  Administered 2019-07-09: 10:00:00 3 mL/kg/h via INTRAVENOUS

## 2019-07-09 MED ORDER — VERAPAMIL HCL 2.5 MG/ML IV SOLN
INTRAVENOUS | Status: AC
  Filled 2019-07-09: qty 2

## 2019-07-09 MED ORDER — IOHEXOL 350 MG/ML SOLN
INTRAVENOUS | Status: DC | PRN
  Administered 2019-07-09: 90 mL

## 2019-07-09 SURGICAL SUPPLY — 11 items
CATH 5FR JL3.5 JR4 ANG PIG MP (CATHETERS) ×1 IMPLANT
CATH LAUNCHER 5F EBU3.5 (CATHETERS) ×1 IMPLANT
DEVICE RAD COMP TR BAND LRG (VASCULAR PRODUCTS) ×1 IMPLANT
GLIDESHEATH SLEND A-KIT 6F 22G (SHEATH) ×1 IMPLANT
GUIDEWIRE INQWIRE 1.5J.035X260 (WIRE) IMPLANT
INQWIRE 1.5J .035X260CM (WIRE) ×2
KIT HEART LEFT (KITS) ×2 IMPLANT
PACK CARDIAC CATHETERIZATION (CUSTOM PROCEDURE TRAY) ×2 IMPLANT
SHEATH PROBE COVER 6X72 (BAG) ×1 IMPLANT
TRANSDUCER W/STOPCOCK (MISCELLANEOUS) ×2 IMPLANT
TUBING CIL FLEX 10 FLL-RA (TUBING) ×2 IMPLANT

## 2019-07-09 NOTE — Discharge Instructions (Signed)
The blockage in the LAD and circumflex are moderately severe and I believe can be managed adequately with medical therapy, mainly aggressive cholesterol lowering. Increase atorvastatin to 80 mg daily.  Take 2 tablets a day until the 40 mg tablets are fully used. Cassandra Leriche, RN will arrange for blood work in 6 weeks and will also write a new prescription for the 80 mg atorvastatin once to 40 mg tablets are used. She will also arrange a follow-up appointment for Cassandra Rios to talk and 2 to 3 months.  Call prior to then if there are symptoms in the chest that are concerning.  Drink plenty of fluids over next 48 hours and keep right wrist elevated at heart level for 24 hours  Radial Site Care  This sheet gives you information about how to care for yourself after your procedure. Your health care provider may also give you more specific instructions. If you have problems or questions, contact your health care provider. What can I expect after the procedure? After the procedure, it is common to have:  Bruising and tenderness at the catheter insertion area. Follow these instructions at home: Medicines  Take over-the-counter and prescription medicines only as told by your health care provider. Insertion site care  Follow instructions from your health care provider about how to take care of your insertion site. Make sure you: ? Wash your hands with soap and water before you change your bandage (dressing). If soap and water are not available, use hand sanitizer. ? Remove your dressing as told by your health care provider. In 24-48 hours  Check your insertion site every day for signs of infection. Check for: ? Redness, swelling, or pain. ? Fluid or blood. ? Pus or a bad smell. ? Warmth.  Do not take baths, swim, or use a hot tub until your health care provider approves.  You may shower 24-48 hours after the procedure, or as directed by your health care provider. ? Remove the dressing and gently  wash the site with plain soap and water. ? Pat the area dry with a clean towel. ? Do not rub the site. That could cause bleeding.  Do not apply powder or lotion to the site. Activity   For 24 hours after the procedure, or as directed by your health care provider: ? Do not flex or bend the affected arm. ? Do not push or pull heavy objects with the affected arm. ? Do not drive yourself home from the hospital or clinic. You may drive 24 hours after the procedure unless your health care provider tells you not to. ? Do not operate machinery or power tools.  Do not lift anything that is heavier than 10 lb (4.5 kg), or the limit that you are told, until your health care provider says that it is safe. For 4 days  Ask your health care provider when it is okay to: ? Return to work or school. ? Resume usual physical activities or sports. ? Resume sexual activity. General instructions  If the catheter site starts to bleed, raise your arm and put firm pressure on the site. If the bleeding does not stop, get help right away. This is a medical emergency.  If you went home on the same day as your procedure, a responsible adult should be with you for the first 24 hours after you arrive home.  Keep all follow-up visits as told by your health care provider. This is important. Contact a health care provider if:  You  have a fever.  You have redness, swelling, or yellow drainage around your insertion site. Get help right away if:  You have unusual pain at the radial site.  The catheter insertion area swells very fast.  The insertion area is bleeding, and the bleeding does not stop when you hold steady pressure on the area.  Your arm or hand becomes pale, cool, tingly, or numb. These symptoms may represent a serious problem that is an emergency. Do not wait to see if the symptoms will go away. Get medical help right away. Call your local emergency services (911 in the [place] Do not drive yourself  to the hospital. Summary  After the procedure, it is common to have bruising and tenderness at the site.  Follow instructions from your health care provider about how to take care of your radial site wound. Check the wound every day for signs of infection.  Do not lift anything that is heavier than 10 lb (4.5 kg), or the limit that you are told, until your health care provider says that it is safe. This information is not intended to replace advice given to you by your health care provider. Make sure you discuss any questions you have with your health care provider. Document Revised: 06/27/2017 Document Reviewed: 06/27/2017 Elsevier Patient Education  2020 [employer].

## 2019-07-09 NOTE — Interval H&P Note (Signed)
Cath Lab Visit (complete for each Cath Lab visit)  Clinical Evaluation Leading to the Procedure:   ACS: No.  Non-ACS:    Anginal Classification: CCS III  Anti-ischemic medical therapy: Minimal Therapy (1 class of medications)  Non-Invasive Test Results: Intermediate-risk stress test findings: cardiac mortality 1-3%/year  Prior CABG: No previous CABG      History and Physical Interval Note:  07/09/2019 10:59 AM  Cassandra Rios  has presented today for surgery, with the diagnosis of Abnormal CT.  The various methods of treatment have been discussed with the patient and family. After consideration of risks, benefits and other options for treatment, the patient has consented to  Procedure(s): LEFT HEART CATH AND CORONARY ANGIOGRAPHY (N/A) as a surgical intervention.  The patient's history has been reviewed, patient examined, no change in status, stable for surgery.  I have reviewed the patient's chart and labs.  Questions were answered to the patient's satisfaction.     Belva Crome III

## 2019-07-09 NOTE — Telephone Encounter (Signed)
I LMVM for pt that returned call.  ? [employer] PAP foundation for [employer].

## 2019-07-09 NOTE — CV Procedure (Signed)
   Coronary angiography via right radial approach.  Real-time vascular ultrasound used for imaging of radial artery and access.  Difficulty cannulating left coronary with Judkins catheter.  Required an EBU 3.5 cm 5 [place] guide catheter.  Good support once engaged.  Left main is normal ostial circumflex angulation into a right angle origin.  The mid circumflex before bifurcating into 2 obtuse marginals contains an eccentric 60 to 70% stenosis, forming Med[place] 100 stenosis.  LAD contains a mid vessel 50 to 60% narrowing.  RCA 40 to 50% mid stenosis.  Left ventricular systolic function is normal.  LVEDP is normal.  Overall: Moderate LAD and moderately severe circumflex disease with mild to moderate right coronary disease.  Preventive therapy with up titration of statin therapy, consider Vascepa.  If angina grade increases, PCI of the circumflex and LAD could occur.  Circumflex stent would Jail the first obtuse marginal and therefore would have a slightly higher ischemic risk going forward.

## 2019-07-10 ENCOUNTER — Telehealth: Payer: Self-pay | Admitting: Interventional Cardiology

## 2019-07-10 DIAGNOSIS — E785 Hyperlipidemia, unspecified: Secondary | ICD-10-CM

## 2019-07-10 MED ORDER — ATORVASTATIN CALCIUM 80 MG PO TABS: 80.0000 mg | ORAL_TABLET | Freq: Every day | ORAL | 3 refills | Status: DC

## 2019-07-10 NOTE — Telephone Encounter (Signed)
Form completed to MM/NP for review and signature, then will mail to pt.

## 2019-07-10 NOTE — Telephone Encounter (Signed)
LMVM for pt (2nd) that returning call about Betaseron PAP.  I will wait to here from her.

## 2019-07-10 NOTE — Telephone Encounter (Signed)
Spoke to pt and she stated that she is needing to enroll in PAP program for betaseron.  I relayed that received to out fax some bayer assistance p/w but no name and was shredded.  I retreived from website and will fill out and then mail to her. She verbalized appreciation and understanding. Prescription will be done when approved.

## 2019-07-10 NOTE — Telephone Encounter (Signed)
Patient states that she is returning a call from Dr. Thompson Caul nurse.

## 2019-07-10 NOTE — Telephone Encounter (Signed)
From: Belva Crome, MD  Sent: 07/09/2019 12:07 PM EST  To: Loren Racer, RN  Subject: Increase intensity of statin therapy       I have asked the patient to take 80 mg of atorvastatin daily.  When she is used up the 40 mg tablets taking 2 a day we should give a prescription for 80 mg. She needs a repeat liver and lipid panel in 6 to 8 weeks with an appointment to see me in 2 months.     Spoke with pt and reviewed information Dr. Tamala Julian sent to me.  Scheduled pt to see Dr. Tamala Julian 4/7.  She will come for fasting labs on 3/23.  Pt verbalized understanding and was appreciative for call.

## 2019-07-14 NOTE — Telephone Encounter (Signed)
signed

## 2019-07-14 NOTE — Telephone Encounter (Signed)
Completed.   To be mailed. Placed in MR.

## 2019-07-17 NOTE — Progress Notes (Signed)
CARDIOLOGY OFFICE NOTE  Date:  07/23/2019    Cassandra Rios Date of Birth: 09-26-1950 Medical Record #[id number]  PCP:  [name], DO  Cardiologist:  Katrinka Blazing   Chief Complaint  Patient presents with  . Follow-up    Seen for Dr. Katrinka Blazing    History of Present Illness: Cassandra Rios is a 69 y.o. female who presents today for a follow up visit. Seen for Dr. Katrinka Blazing.   She has a history of a history of AAA (followed by Dr. Arbie Cookey), right bundle branch block,exertional dyspnea,chest discomfort, diaphoresis,three-vessel coronary calcification by CT 2019,and near syncope. She is a former smoker.   Last seen by Dr. Katrinka Blazing in early December. Continued to note DOE associated with diaphoresis, dizziness and fatigue. Cardiac CT was done - showed significant stenosis in pLCX and possible RCA - referred on for cardiac cath - done earlier this month - no PCI - to have aggressive medical therapy.   The patient does not have symptoms concerning for COVID-19 infection (fever, chills, cough, or new shortness of breath).   Comes in today. Here alone. She notes that she is really not doing much since her cath - can't really determine if she has improved or not but feels like she is better. She has increased her Lipitor - she is worried about taking this long term. She remains tired all the time. No regular exercise.  She has not had any more chest discomfort. Her dizziness is better. She admits to have seasonal affective disorder and this may be playing a role. She is thinking about follow up with pulmonary as well. Will be getting her first COVID vaccine on Monday.    Past Medical History:  Diagnosis Date  . AAA (abdominal aortic aneurysm) (HCC)   . Allergic rhinitis   . Chronic hoarseness   . Colon polyps   . COPD (chronic obstructive pulmonary disease) (HCC)    Dr. Marchelle Gearing  . Dyslipidemia   . Hypertension   . Hypothyroidism   . MS (multiple sclerosis) (HCC)   . Obesity    . Stomach ulcer from aspirin/ibuprofen-like drugs (NSAID's)     Past Surgical History:  Procedure Laterality Date  . ABDOMINAL HYSTERECTOMY    . BARIATRIC SURGERY  09/07/08   lap band  . CARPAL TUNNEL RELEASE    . CATARACT EXTRACTION Right 11/08/2017  . CATARACT EXTRACTION Left 11/22/2017  . CATARACT EXTRACTION, BILATERAL      Toric lenses for astigmatism  . CHOLECYSTECTOMY    . CHOLECYSTECTOMY    . HIATAL HERNIA REPAIR  09/07/08  . LEFT HEART CATH AND CORONARY ANGIOGRAPHY N/A 07/09/2019   Procedure: LEFT HEART CATH AND CORONARY ANGIOGRAPHY;  Surgeon: Lyn Records, MD;  Location: [facility];  Service: Cardiovascular;  Laterality: N/A;     Medications: Current Meds  Medication Sig  . acetaminophen (TYLENOL) 500 MG tablet Take 1,000 mg by mouth every 6 (six) hours as needed for moderate pain or headache.  Marland Kitchen aspirin EC 81 MG tablet Take 1 tablet (81 mg total) by mouth daily.  Marland Kitchen atorvastatin (LIPITOR) 80 MG tablet Take 1 tablet (80 mg total) by mouth daily.  . calcium carbonate (TUMS - DOSED IN MG ELEMENTAL CALCIUM) 500 MG chewable tablet Chew 2 tablets by mouth daily as needed for indigestion or heartburn.  . diphenhydramine-acetaminophen (TYLENOL PM) 25-500 MG TABS tablet Take 0.5-1 tablets by mouth at bedtime as needed (sleep).  . gabapentin (NEURONTIN) 300  MG capsule Take 600 mg by mouth every evening.   Marland Kitchen HYDROcodone-acetaminophen (NORCO/VICODIN) 5-325 MG per tablet Take 0.5 tablets by mouth 2 (two) times daily as needed for moderate pain.   . Interferon Beta-1b (BETASERON/EXTAVIA) 0.3 MG KIT injection Inject 0.25 mg into the skin every other day.  . levothyroxine (SYNTHROID) 112 MCG tablet TAKE 1 TABLET (112 MCG TOTAL) BY MOUTH DAILY BEFORE BREAKFAST.  . mometasone (NASONEX) 50 MCG/ACT nasal spray Place 2 sprays into the nose as needed.  Bertram Gala Glycol-Propyl Glycol (SYSTANE ULTRA OP) Place 1 drop into the right eye daily as needed (dry eye).   Marland Kitchen sertraline (ZOLOFT) 50  MG tablet Take 50 mg by mouth daily.  Marland Kitchen telmisartan-hydrochlorothiazide (MICARDIS HCT) 40-12.5 MG tablet Take 1 tablet by mouth daily.  . traMADol (ULTRAM) 50 MG tablet Take 50 mg by mouth every 6 (six) hours as needed.  . triamcinolone ointment (KENALOG) 0.1 % Apply 1 application topically every other day.      Allergies: Allergies  Allergen Reactions  . Codeine Itching and Nausea And Vomiting  . Hydromorphone Hcl Nausea And Vomiting  . Ibuprofen Other (See Comments)    stomach ulcers  . Morphine Sulfate     "makes my blood pressure bottom out"  . Metronidazole Hives, Nausea And Vomiting and Rash    Social History: The patient  reports that she quit smoking about 12 years ago. Her smoking use included cigarettes. She has a 80.00 pack-year smoking history. She has never used smokeless tobacco. She reports that she does not drink alcohol or use drugs.   Family History: The patient's family history includes AAA (abdominal aortic aneurysm) in her father and sister; Arthritis in her sister; COPD in her father and sister; Dementia in her sister; Heart disease in her father and mother; Other in her sister; Stroke (age of onset: 75) in her mother.   Review of Systems: Please see the history of present illness.   All other systems are reviewed and negative.   Physical Exam: VS:  BP 118/76   Pulse 85   Ht 5\' 6"  (1.676 m)   Wt 246 lb (111.6 kg)   SpO2 98%   BMI 39.71 kg/m  .  BMI Body mass index is 39.71 kg/m.  Wt Readings from Last 3 Encounters:  07/23/19 246 lb (111.6 kg)  07/09/19 248 lb (112.5 kg)  07/03/19 248 lb (112.5 kg)    General: Alert and in no acute distress.   Cardiac: Regular rate and rhythm. No murmurs, rubs, or gallops. No edema.  Respiratory:  Lungs are clear to auscultation bilaterally with normal work of breathing.  GI: Soft and nontender.  MS: No deformity or atrophy. Gait and ROM intact.  Skin: Warm and dry. Color is normal.  Neuro:  Strength and  sensation are intact and no gross focal deficits noted.  Psych: Alert, appropriate and with normal affect. Her right wrist looks fine.    LABORATORY DATA:  EKG:  EKG is not ordered today.  Lab Results  Component Value Date   WBC 6.4 07/03/2019   HGB 14.1 07/03/2019   HCT 42.0 07/03/2019   PLT 143 (L) 07/03/2019   GLUCOSE 108 (H) 07/03/2019   CHOL 146 06/24/2019   TRIG 117 06/24/2019   HDL 47 06/24/2019   LDLCALC 78 06/24/2019   ALT 13 06/24/2019   AST 19 06/24/2019   NA 138 07/03/2019   K 3.9 07/03/2019   CL 100 07/03/2019   CREATININE 0.99 07/03/2019  BUN 12 07/03/2019   CO2 25 07/03/2019   TSH 0.82 04/21/2019   HGBA1C 5.9 09/25/2016   MICROALBUR 2.5 (H) 09/15/2014     BNP (last 3 results) No results for input(s): BNP in the last 8760 hours.  ProBNP (last 3 results) Recent Labs    05/08/19 1117  PROBNP 304*     Other Studies Reviewed Today:  LEFT HEART CATH AND CORONARY ANGIOGRAPHY 07/09/2019  Conclusion   Normal left main  Mid LAD concentric 60 to 70% narrowing  Proximal to mid circumflex prior to bifurcation 70%  Mid dominant RCA 40 to 45%  Normal left ventricular systolic function with EF 60%.  RECOMMENDATIONS:   Aggressive preventive therapy.  Increase intensity of statin therapy.  PCI at some point in the future if symptoms warrant.     Cardiac CTA 06/28/19 Aorta: Normal size. Ascending aorta 2.8 cm. Mild calcification of the aortic root. Moderate atherosclerosis of the descending aorta. No dissection.  Aortic Valve: Trileaflet. No calcifications.  Coronary Arteries: Normal coronary origin. Right dominance.  RCA is a large dominant artery that gives rise to PDA and PLVB. Diffuse calcification with minimal(<25%) obstruction proximally and moderate (50-69%) mixed plaque in the mid RCA. Minimal distal RCA plaque.  Left main is a large artery that gives rise to LAD and LCX arteries. There is minimal (<25%) calcified plaque  in the distal LM.  LAD is a large vessel that is heavily calcified. There is mild (25-49%) calcified plaque proximally. The mid LAD is heavily calcified with moderate (50-69%) calcified plaque followed by moderate mixed plaque. There is mild (25-49%) mixed plaque distally. Small D1 and D2 that are heavily calcified. Moderate calcified plaque.  LCX is a non-dominant artery. There is minimal (<25%) calcified plaque in the ostium and severe (>70%) mixed plaque just proximal to OM1. Minimal calcified plaque in the mid LCX. Small OM1 and OM2 without significant disease.  Other findings:  Normal pulmonary vein drainage into the left atrium.  Normal let atrial appendage without a thrombus.  Normal size of the pulmonary artery.  IMPRESSION: 1. Coronary calcium score of 1106. This was 98th percentile for age and sex matched control.  2. Normal coronary origin with right dominance.  3. Heavily calcified coronary arteries.  4. There appears to be obstructive disease in the proximal LCX and possibly in D2. D2 is a small vessel best treated medically.  5. Will send study for FFRct.  6. Moderate atherosclerosis of the descending aorta.  IMPRESSION: 1. CT FFR analysis showed significant stenosis in the proximal LCX and possibly significant stenosis in the mid to distal LAD.  2. Coronary calcium score 1106, 98th percentile for age and gender.  3. Recommend cardiac catheterization for further evaluation.    ASSESSMENT AND PLAN:  1. DOE/chest pain - s/p abnormal cardiac CT and now s/p cath - she is feeling better - we discussed need for regular activity and staying on the higher dose of her statin. See back as planned.   2. Chronic RBBB  3. AAA - followed by Dr. Arbie Cookey - last evaluated in 06/2018  4. HLD - now on higher intensity therapy - has labs next month.    5. MS - followed by neurology - not discussed.   6. HTN - BP is fine.   7. COVID-19  Education: The signs and symptoms of COVID-19 were discussed with the patient and how to seek care for testing (follow up with PCP or arrange E-visit).  The importance of social  distancing, staying at home, hand hygiene and wearing a mask when out in public were discussed today. 1st COVID vaccine next week.   Current medicines are reviewed with the patient today.  The patient does not have concerns regarding medicines other than what has been noted above.  The following changes have been made:  See above.  Labs/ tests ordered today include:   No orders of the defined types were placed in this encounter.    Disposition:   FU with Dr. Katrinka Blazing as planned in April.   Patient is agreeable to this plan and will call if any problems develop in the interim.   SignedNorma Fredrickson, NP  07/23/2019 2:46 PM  [facility] [address] [place], [place]  [address] Phone: [phone] Fax: [phone]

## 2019-07-21 ENCOUNTER — Telehealth: Payer: Self-pay | Admitting: Adult Health

## 2019-07-21 NOTE — Telephone Encounter (Signed)
Pt called wanting to know if she will be ok to get the COVID Vaccine since she is on Interferon Beta-1b (BETASERON) 0.3 MG KIT injection Please advise.

## 2019-07-21 NOTE — Telephone Encounter (Signed)
I spoke to pt and relayed that recommended to get Covid vacc (she takes betaseron) as per MM/NP and MS society recommendations.

## 2019-07-23 ENCOUNTER — Ambulatory Visit (INDEPENDENT_AMBULATORY_CARE_PROVIDER_SITE_OTHER): Payer: PPO | Admitting: Nurse Practitioner

## 2019-07-23 ENCOUNTER — Other Ambulatory Visit: Payer: Self-pay

## 2019-07-23 ENCOUNTER — Telehealth: Payer: Self-pay | Admitting: Family Medicine

## 2019-07-23 ENCOUNTER — Encounter: Payer: Self-pay | Admitting: Nurse Practitioner

## 2019-07-23 VITALS — BP 118/76 | HR 85 | Ht 66.0 in | Wt 246.0 lb

## 2019-07-23 DIAGNOSIS — Z7189 Other specified counseling: Secondary | ICD-10-CM

## 2019-07-23 DIAGNOSIS — I10 Essential (primary) hypertension: Secondary | ICD-10-CM | POA: Diagnosis not present

## 2019-07-23 DIAGNOSIS — E785 Hyperlipidemia, unspecified: Secondary | ICD-10-CM

## 2019-07-23 DIAGNOSIS — R0609 Other forms of dyspnea: Secondary | ICD-10-CM

## 2019-07-23 DIAGNOSIS — R0600 Dyspnea, unspecified: Secondary | ICD-10-CM | POA: Diagnosis not present

## 2019-07-23 DIAGNOSIS — Z9889 Other specified postprocedural states: Secondary | ICD-10-CM | POA: Diagnosis not present

## 2019-07-23 NOTE — Patient Instructions (Addendum)
After Visit Summary:  We will be checking the following labs today - NONE   Medication Instructions:    Continue with your current medicines.    If you need a refill on your cardiac medications before your next appointment, please call your pharmacy.     Testing/Procedures To Be Arranged:  N/A  Follow-Up:   See Dr. Tamala Julian in April as planned with labs prior.     At [facility], you and your health needs are our priority.  As part of our continuing mission to provide you with exceptional heart care, we have created designated Provider Care Teams.  These Care Teams include your primary Cardiologist (physician) and Advanced Practice Providers (APPs -  Physician Assistants and Nurse Practitioners) who all work together to provide you with the care you need, when you need it.  Special Instructions:  . Stay safe, stay home, wash your hands for at least 20 seconds and wear a mask when out in public.  . It was good to talk with you today.  . Think about what we talked about today.    Call the [place] office at [phone] if you have any questions, problems or concerns.

## 2019-07-23 NOTE — Chronic Care Management (AMB) (Signed)
Chronic Care Management   Note  07/23/2019 Name: Cassandra Rios MRN: [id number] DOB: 01/05/1951  Cassandra Rios is a 69 y.o. year old female who is a primary care patient of [name], DO. I reached out to [name] by phone today in response to a referral sent by [name]'s PCP, [name], DO.   [name] was given information about Chronic Care Management services today including:  1. CCM service includes personalized support from designated clinical staff supervised by her physician, including individualized plan of care and coordination with other care providers 2. 24/7 contact phone numbers for assistance for urgent and routine care needs. 3. Service will only be billed when office clinical staff spend 20 minutes or more in a month to coordinate care. 4. Only one practitioner may furnish and bill the service in a calendar month. 5. The patient may stop CCM services at any time (effective at the end of the month) by phone call to the office staff. 6. The patient will be responsible for cost sharing (co-pay) of up to 20% of the service fee (after annual deductible is met).  Patient agreed to services and verbal consent obtained.   Follow up plan:   [name] [employer] Scheduler

## 2019-07-30 ENCOUNTER — Other Ambulatory Visit: Payer: Self-pay

## 2019-07-30 ENCOUNTER — Other Ambulatory Visit: Payer: PPO

## 2019-07-30 ENCOUNTER — Ambulatory Visit: Payer: PPO | Admitting: Pharmacist

## 2019-07-30 DIAGNOSIS — G35 Multiple sclerosis: Secondary | ICD-10-CM

## 2019-07-30 DIAGNOSIS — F32A Depression, unspecified: Secondary | ICD-10-CM

## 2019-07-30 DIAGNOSIS — I25118 Atherosclerotic heart disease of native coronary artery with other forms of angina pectoris: Secondary | ICD-10-CM

## 2019-07-30 DIAGNOSIS — E039 Hypothyroidism, unspecified: Secondary | ICD-10-CM

## 2019-07-30 DIAGNOSIS — F329 Major depressive disorder, single episode, unspecified: Secondary | ICD-10-CM

## 2019-07-30 DIAGNOSIS — J309 Allergic rhinitis, unspecified: Secondary | ICD-10-CM

## 2019-07-30 DIAGNOSIS — I10 Essential (primary) hypertension: Secondary | ICD-10-CM

## 2019-07-30 DIAGNOSIS — J209 Acute bronchitis, unspecified: Secondary | ICD-10-CM

## 2019-07-30 DIAGNOSIS — E785 Hyperlipidemia, unspecified: Secondary | ICD-10-CM

## 2019-07-30 DIAGNOSIS — N904 Leukoplakia of vulva: Secondary | ICD-10-CM

## 2019-07-30 DIAGNOSIS — J440 Chronic obstructive pulmonary disease with acute lower respiratory infection: Secondary | ICD-10-CM

## 2019-07-30 NOTE — Patient Instructions (Signed)
Visit Information  Goals Addressed            This Visit's Progress   . Blood pressure goal <140/90      . Check blood pressure 2 to 3 times per week      . Discuss antidepressant alternative with Dr. Etter Sjogren      . Pharmacy Care Plan       Current Barriers:  . Chronic Disease Management support, education, and care coordination needs related to COPD, HTN, HLD, CAD, Hypothyroidism, Depression, Allergic Rhinitis, Multiple Sclerosis, Back Pain, Lichen Sclerosis, Insomnia  Pharmacist Clinical Goal(s):  . Blood pressure goal <140/90  Interventions: . Comprehensive medication review performed. . Check blood pressure 2 to 3 times per week . Schedule appointment with pulmonologist . Discuss antidepressant alternative with Dr. Etter Sjogren  Patient Self Care Activities:  . Patient verbalizes understanding of plan to follow as described above, Self administers medications as prescribed, Calls pharmacy for medication refills, and Calls provider office for new concerns or questions  Initial goal documentation     . Schedule appointment with pulmonologist         Ms. Kiernan was given information about Chronic Care Management services today including:  1. CCM service includes personalized support from designated clinical staff supervised by her physician, including individualized plan of care and coordination with other care providers 2. 24/7 contact phone numbers for assistance for urgent and routine care needs. 3. Service will only be billed when office clinical staff spend 20 minutes or more in a month to coordinate care. 4. Only one practitioner may furnish and bill the service in a calendar month. 5. The patient may stop CCM services at any time (effective at the end of the month) by phone call to the office staff. 6. The patient will be responsible for cost sharing (co-pay) of up to 20% of the service fee (after annual deductible is met).  Patient agreed to services and verbal consent  obtained.   The patient verbalized understanding of instructions provided today and agreed to receive a mailed copy of patient instruction and/or educational materials. The pharmacy team will reach out to the patient again over the next 30 days.   De Blanch, PharmD Clinical Pharmacist [facility] [phone]   Blood Pressure Record Sheet To take your blood pressure, you will need a blood pressure machine. You can buy a blood pressure machine (blood pressure monitor) at your clinic, drug store, or online. When choosing one, consider:  An automatic monitor that has an arm cuff.  A cuff that wraps snugly around your upper arm. You should be able to fit only one finger between your arm and the cuff.  A device that stores blood pressure reading results.  Do not choose a monitor that measures your blood pressure from your wrist or finger. Follow your health care provider's instructions for how to take your blood pressure. To use this form:  Get one reading in the morning (a.m.) before you take any medicines.  Get one reading in the evening (p.m.) before supper.  Take at least 2 readings with each blood pressure check. This makes sure the results are correct. Wait 1-2 minutes between measurements.  Write down the results in the spaces on this form.  Repeat this once a week, or as told by your health care provider.  Make a follow-up appointment with your health care provider to discuss the results. Blood pressure log Date: _______________________  a.m. _____________________(1st reading) _____________________(2nd  reading)  p.m. _____________________(1st reading) _____________________(2nd reading) Date: _______________________  a.m. _____________________(1st reading) _____________________(2nd reading)  p.m. _____________________(1st reading) _____________________(2nd reading) Date: _______________________  a.m. _____________________(1st  reading) _____________________(2nd reading)  p.m. _____________________(1st reading) _____________________(2nd reading) Date: _______________________  a.m. _____________________(1st reading) _____________________(2nd reading)  p.m. _____________________(1st reading) _____________________(2nd reading) Date: _______________________  a.m. _____________________(1st reading) _____________________(2nd reading)  p.m. _____________________(1st reading) _____________________(2nd reading) This information is not intended to replace advice given to you by your health care provider. Make sure you discuss any questions you have with your health care provider. Document Revised: 07/20/2017 Document Reviewed: 05/22/2017 Elsevier Patient Education  [place].   How to Take Your Blood Pressure You can take your blood pressure at home with a machine. You may need to check your blood pressure at home:  To check if you have high blood pressure (hypertension).  To check your blood pressure over time.  To make sure your blood pressure medicine is working. Supplies needed: You will need a blood pressure machine, or monitor. You can buy one at a drugstore or online. When choosing one:  Choose one with an arm cuff.  Choose one that wraps around your upper arm. Only one finger should fit between your arm and the cuff.  Do not choose one that measures your blood pressure from your wrist or finger. Your doctor can suggest a monitor. How to prepare Avoid these things for 30 minutes before checking your blood pressure:  Drinking caffeine.  Drinking alcohol.  Eating.  Smoking.  Exercising. Five minutes before checking your blood pressure:  Pee.  Sit in a dining chair. Avoid sitting in a soft couch or armchair.  Be quiet. Do not talk. How to take your blood pressure Follow the instructions that came with your machine. If you have a digital blood pressure monitor, these may be the  instructions: 1. Sit up straight. 2. Place your feet on the floor. Do not cross your ankles or legs. 3. Rest your left arm at the level of your heart. You may rest it on a table, desk, or chair. 4. Pull up your shirt sleeve. 5. Wrap the blood pressure cuff around the upper part of your left arm. The cuff should be 1 inch (2.5 cm) above your elbow. It is best to wrap the cuff around bare skin. 6. Fit the cuff snugly around your arm. You should be able to place only one finger between the cuff and your arm. 7. Put the cord inside the groove of your elbow. 8. Press the power button. 9. Sit quietly while the cuff fills with air and loses air. 10. Write down the numbers on the screen. 11. Wait 2-3 minutes and then repeat steps 1-10. What do the numbers mean? Two numbers make up your blood pressure. The first number is called systolic pressure. The second is called diastolic pressure. An example of a blood pressure reading is "120 over 80" (or 120/80). If you are an adult and do not have a medical condition, use this guide to find out if your blood pressure is normal: Normal  First number: below 120.  Second number: below 80. Elevated  First number: 120-129.  Second number: below 80. Hypertension stage 1  First number: 130-139.  Second number: 80-89. Hypertension stage 2  First number: 140 or above.  Second number: 39 or above. Your blood pressure is above normal even if only the top or bottom number is above normal. Follow these instructions at home:  Check your blood pressure  as often as your doctor tells you to.  Take your monitor to your next doctor's appointment. Your doctor will: ? Make sure you are using it correctly. ? Make sure it is working right.  Make sure you understand what your blood pressure numbers should be.  Tell your doctor if your medicines are causing side effects. Contact a doctor if:  Your blood pressure keeps being high. Get help right away  if:  Your first blood pressure number is higher than 180.  Your second blood pressure number is higher than 120. This information is not intended to replace advice given to you by your health care provider. Make sure you discuss any questions you have with your health care provider. Document Revised: 05/04/2017 Document Reviewed: 10/29/2015 Elsevier Patient Education  2020 [employer].

## 2019-07-30 NOTE — Chronic Care Management (AMB) (Signed)
Chronic Care Management Pharmacy  Name: Cassandra Rios  MRN: [id number] DOB: Jun 23, 1950  Chief Complaint/ HPI  Cassandra Rios,  69 y.o. , female presents for their Initial CCM visit with the clinical pharmacist via telephone due to COVID-19 Pandemic.  PCP : [name], DO  Their chronic conditions include: COPD, HTN, HLD, CAD, Hypothyroidism, Depression, Allergic Rhinitis, Multiple Sclerosis, Back Pain, Lichen Sclerosis, Insomnia  Office Visits: 04/21/19: Office visit w/ Dr. Laury Axon - F/U for cholesterol and thyroid. Labs ordered (CMP, Thyroid, TSH)  Consult Visits: 07/23/19: Cardio office visit w/ Norma Fredrickson, NP - S/P abnormal cardiac CT and cath. Pt increased lipitor to 80mg . Reports tiredness. Discussion about getting regular physical activity and staying on higher dose of statin. Repeat lipid panel in March. Follow up with Dr. Katrinka Blazing in April  07/09/19 - Cardiac cath  07/03/19: Cardio office visit w/ Nada Boozer, NP - SOB on exertion associated with diaphoresis, dizziness, and fatigue. Crescendo angina with abnormal cardiac CTA with >70% stenosis in LCX. Plan for cardiac cath and probably PCI. Did not add BB due to systolic of 102. Will need to increase atorvastatin to 80mg  daily, but will hold at 40mg  until cath. Pt due for colonoscopy, but should defer until 6 months to a year due to need to stop aspirin and antiplatelet.  05/14/19: Neuro office visit with Butch Penny, NP - MS F/U. Continue Betaseron. Consider MRI at next visit  05/08/19: Cardio office visit w/ Dr. Katrinka Blazing - Progression of dyspnea on exertion over last 6 months. No acute change. Has refused oxygen therapy recommended by pulm in the past. No orthopnea. Significant three-vessel coronary calcification on a 9 coronary CT performed year prior. Coronary CTA with FFR will be performed to rule out severe three-vessel coronary disease. Increase atorvastatin to 40mg . LDL goal of <70.  Medications: Outpatient  Encounter Medications as of 07/30/2019  Medication Sig Note  . acetaminophen (TYLENOL) 500 MG tablet Take 1,000 mg by mouth every 6 (six) hours as needed for moderate pain or headache.   Marland Kitchen aspirin EC 81 MG tablet Take 1 tablet (81 mg total) by mouth daily.   Marland Kitchen atorvastatin (LIPITOR) 80 MG tablet Take 1 tablet (80 mg total) by mouth daily.   . calcium carbonate (TUMS - DOSED IN MG ELEMENTAL CALCIUM) 500 MG chewable tablet Chew 2 tablets by mouth daily as needed for indigestion or heartburn. 07/30/2019: Uses when she takes sertraline without food. About once a week  . diphenhydramine-acetaminophen (TYLENOL PM) 25-500 MG TABS tablet Take 0.5-1 tablets by mouth at bedtime as needed (sleep). 07/30/2019: Usually uses about twice weekly  . gabapentin (NEURONTIN) 300 MG capsule Take 600 mg by mouth every evening.    Marland Kitchen HYDROcodone-acetaminophen (NORCO/VICODIN) 5-325 MG per tablet Take 0.5 tablets by mouth 2 (two) times daily as needed for moderate pain.  07/30/2019: Last used month ago  . Interferon Beta-1b (BETASERON/EXTAVIA) 0.3 MG KIT injection Inject 0.25 mg into the skin every other Maninder Deboer.   . levothyroxine (SYNTHROID) 112 MCG tablet TAKE 1 TABLET (112 MCG TOTAL) BY MOUTH DAILY BEFORE BREAKFAST.   . mometasone (NASONEX) 50 MCG/ACT nasal spray Place 2 sprays into the nose as needed. 07/30/2019: Uses about once weekly  . Omega-3 Fatty Acids (FISH OIL) 1000 MG CAPS Take 1 capsule by mouth daily.   Bertram Gala Glycol-Propyl Glycol (SYSTANE ULTRA OP) Place 1 drop into the right eye daily as needed (dry eye).    Marland Kitchen sertraline (ZOLOFT) 50 MG  tablet Take 50 mg by mouth daily.   Marland Kitchen telmisartan-hydrochlorothiazide (MICARDIS HCT) 40-12.5 MG tablet Take 1 tablet by mouth daily.   . traMADol (ULTRAM) 50 MG tablet Take 50 mg by mouth every 6 (six) hours as needed. 07/30/2019: Uses 1-2x/ month as result of HA from betaferon injection  . triamcinolone ointment (KENALOG) 0.1 % Apply 1 application topically every other Cera Rorke.      No facility-administered encounter medications on file as of 07/30/2019.   Immunization History  Administered Date(s) Administered  . Fluad Quad(high Dose 65+) 04/21/2019  . H1N1 07/20/2008  . Influenza Whole 02/26/2008, 03/04/2010, 02/27/2013  . Influenza, High Dose Seasonal PF 04/01/2015, 03/27/2016, 04/19/2018  . Pneumococcal Conjugate-13 04/01/2015  . Pneumococcal Polysaccharide-23 07/20/2008, 03/07/2011, 04/19/2018  . Tdap 03/07/2011  . Zoster 02/28/2010   Received first COVID vaccine on 07/28/19 Gala Murdoch). Receiving 2nd vaccine around 08/18/19.  Current Diagnosis/Assessment:  Goals Addressed            This Visit's Progress   . Blood pressure goal <140/90      . Check blood pressure 2 to 3 times per week      . Discuss antidepressant alternative with Dr. Laury Axon      . Pharmacy Care Plan       Current Barriers:  . Chronic Disease Management support, education, and care coordination needs related to COPD, HTN, HLD, CAD, Hypothyroidism, Depression, Allergic Rhinitis, Multiple Sclerosis, Back Pain, Lichen Sclerosis, Insomnia  Pharmacist Clinical Goal(s):  . Blood pressure goal <140/90  Interventions: . Comprehensive medication review performed. . Check blood pressure 2 to 3 times per week . Schedule appointment with pulmonologist . Discuss antidepressant alternative with Dr. Laury Axon  Patient Self Care Activities:  . Patient verbalizes understanding of plan to follow as described above, Self administers medications as prescribed, Calls pharmacy for medication refills, and Calls provider office for new concerns or questions  Initial goal documentation     . Schedule appointment with pulmonologist        Social Hx:  Married. 2 kids. 5 grandchildren. 2 great grandchildren.  Uses pill box and fills them up 2 weeks at a time  COPD   Last spirometry score: 10/02/2007  FEV1 = 2.08L/ 77%  Gold Grade: Gold 2 (FEV1 50-79%) - Needs updated spirometry anticipate FEV1  decline Current COPD Classification:  A (low sx, <2 exacerbations/yr)  Eosinophil count:   Lab Results  Component Value Date/Time   EOSPCT 2.1 10/22/2018 08:56 AM   EOSPCT 3.6 07/07/2010 03:30 PM  %                               Eos (Absolute):  Lab Results  Component Value Date/Time   EOSABS 0.1 10/22/2018 08:56 AM   EOSABS 0.1 09/05/2016 09:44 AM   EOSABS 0.2 07/07/2010 03:30 PM    Tobacco Status:  Social History   Tobacco Use  Smoking Status Former Smoker  . Packs/Deklyn Gibbon: 2.00  . Years: 40.00  . Pack years: 80.00  . Types: Cigarettes  . Quit date: 04/05/2007  . Years since quitting: 12.3  Smokeless Tobacco Never Used    Patient has failed these meds in past: None noted (advair listed in D/C meds. No apparent reason for D/C) Patient is currently uncontrolled on the following medications: None Using maintenance inhaler regularly? No Frequency of rescue inhaler use:  never   States she initially used a maintenance inhaler, but once she stopped smoking her  COPD improved so she stopped the inhaler on her own.  Pt is agreeable to following up with pulmonology once she is fully vaccinated against Covid.   We discussed:  Importance of maintaining lung fuction and utility of maintanence inhalers to prevent lung fuction decline.   Plan -Follow up with pulmonology and complete updated spirometry -Recommend initiation of maintenance inhaler pending spirometry to prevent lung function decline  ,  Hypertension   CMP Latest Ref Rng & Units 07/03/2019 06/24/2019 04/21/2019  Glucose 65 - 99 mg/dL 528(U) 132(G) 401(U)  BUN 8 - 27 mg/dL 12 14 11   Creatinine 0.57 - 1.00 mg/dL 2.72 5.36(U) 4.40  Sodium 134 - 144 mmol/L 138 136 134(L)  Potassium 3.5 - 5.2 mmol/L 3.9 4.4 4.6  Chloride 96 - 106 mmol/L 100 99 98  CO2 20 - 29 mmol/L 25 24 27   Calcium 8.7 - 10.3 mg/dL 9.2 9.2 9.7  Total Protein 6.0 - 8.5 g/dL - 6.2 7.0  Total Bilirubin 0.0 - 1.2 mg/dL - 0.3 0.6  Alkaline Phos 39 - 117  IU/L - 86 74  AST 0 - 40 IU/L - 19 22  ALT 0 - 32 IU/L - 13 16     BP today is: Unable to assess due to phone visit  Office blood pressures are  BP Readings from Last 3 Encounters:  07/23/19 118/76  07/09/19 130/60  07/03/19 102/72    Patient has failed these meds in the past: None noted  Patient is currently controlled on the following medications: telmisartan-hctz 40-12.5mg  daily  Patient checks BP at home infrequently  Has a BP cuff Last checked couple of months ago  We discussed Importance of assuring her BP is not too low, proper blood pressure measuring technique  Plan -Check BP 2-3 times per week -Continue current medications   Hyperlipidemia   Lipid Panel     Component Value Date/Time   CHOL 146 06/24/2019 0735   TRIG 117 06/24/2019 0735   HDL 47 06/24/2019 0735   CHOLHDL 3.1 06/24/2019 0735   CHOLHDL 4 04/21/2019 1036   VLDL 28.2 04/21/2019 1036   LDLCALC 78 06/24/2019 0735   LABVLDL 21 06/24/2019 0735     ASCVD 10-year risk: Has Hx of ASCVD  LDL goal of <70  Patient has failed these meds in past: None noted Patient is currently uncontrolled for LDL on the following medications: atorvastatin 80mg  daily, fish oil 1000mg  daily  Blockages were not enough to stent Lab redraw on 3/23 Dr. Katrinka Blazing appt on 4/7  We discussed:  LDL goal of <100 vs <70 (recommend LDL of <70 noting hx of ASCVD)  Plan -Continue current medications  CAD   Patient has failed these meds in past: None noted Patient is currently controlled on the following medications: aspirin 81mg  daily, atorvastatin 80mg  daily, fish oil 1000mg  daily  Dr. Katrinka Blazing appt on 4/7 Beta blocker not started due to concerns with low systolic BP  We discussed:  importance of reducing risk of future ASCVD events  Plan -Continue current medications   Hypothyroidism   04/21/19: TSH = 0.82  Patient has failed these meds in past: None noted Patient is currently controlled on the following  medications: levothyroxine daily  Plan -Continue current medications   Depression   Patient has failed these meds in past: fluoxetine (stopped working) Patient is currently controlled on the following medications: sertraline 50mg  daily, tums 500mg    Setraline: "set's my stomach on fire if I take it without food on my  stomach"  Looking for more GI friendly option for depression  Considerations Sertraline is the worse offender for GI side effects of the SSRI class Escitalopram: May have less GI side effect than sertraline (no cross taper needed) Duloxetine: May have similar GI side effect profile, but could also help with pain (no cross taper needed) Bupropion: May have less GI side effect, but need to consider risk of hypertension and exacerbated anxiety (cross tapering may be necessary)  PHQ9: 1 (1 - trouble falling asleep)  We discussed:  Dannielle Karvonen to share with Dr. Laury Axon alternatives that patient can discuss with Dr. Laury Axon at May follow up appointment  Plan -Continue current medications  -Consider alternative antidepressant with less GI side effects   Allergic Rhinitis   Patient has failed these meds in past: Took allergy injections in the past and they were not effective per patient Patient is currently controlled on the following medications: nasonex 50mg  2 sprays EN PRN, has rx for levocetirizine but hasn't had to take in a long time  Plan -Continue current medications   Multiple Sclerosis   Patient has failed these meds in past: None noted Patient is currently controlled on the following medications: interferon beta1-6 0.3mg  every other Aleister Lady, tramadol 50mg  PRN from HA from interferon beta 1-b  Plan -Continue current medications   Back pain   Patient has failed these meds in past: steroid injections not effective. 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

## 2019-08-05 NOTE — Telephone Encounter (Signed)
I received fax form [employer] and when call pts application in benefits verification and nothing was needed from [name].

## 2019-08-06 ENCOUNTER — Other Ambulatory Visit: Payer: Self-pay | Admitting: Family Medicine

## 2019-08-11 NOTE — Telephone Encounter (Signed)
Pt is asking for a call from [place], [place] re: the fax she received re: her BETASERON

## 2019-08-11 NOTE — Telephone Encounter (Signed)
I spoke to pt then chris at [facility]aseron.  Received fax asking for orders for autoinjector.  I spoke to Stella, he relayed what needed to be filled out.  Done and check in file for pt to come by and sign then will be faxed after that.

## 2019-08-12 NOTE — Telephone Encounter (Signed)
Signed by pt, and fax confirmation received Betaseron [phone] fax, ofv [phone].

## 2019-08-26 ENCOUNTER — Other Ambulatory Visit: Payer: PPO

## 2019-08-26 ENCOUNTER — Other Ambulatory Visit: Payer: Self-pay

## 2019-08-26 DIAGNOSIS — E785 Hyperlipidemia, unspecified: Secondary | ICD-10-CM

## 2019-08-26 LAB — LIPID PANEL
Chol/HDL Ratio: 2.8 ratio (ref 0.0–4.4)
Cholesterol, Total: 130 mg/dL (ref 100–199)
HDL: 47 mg/dL (ref >39)
LDL Chol Calc (NIH): 65 mg/dL (ref 0–99)
Triglycerides: 98 mg/dL (ref 0–149)
VLDL Cholesterol Cal: 18 mg/dL (ref 5–40)

## 2019-08-26 LAB — HEPATIC FUNCTION PANEL
ALT: 18 IU/L (ref 0–32)
AST: 25 IU/L (ref 0–40)
Albumin: 3.9 g/dL (ref 3.8–4.8)
Alkaline Phosphatase: 92 IU/L (ref 39–117)
Bilirubin Total: 0.3 mg/dL (ref 0.0–1.2)
Bilirubin, Direct: 0.14 mg/dL (ref 0.00–0.40)
Total Protein: 6.2 g/dL (ref 6.0–8.5)

## 2019-08-27 NOTE — Progress Notes (Signed)
Pt has been made aware of normal result and verbalized understanding.  jw  Copy fwd to Dr. Etter Sjogren

## 2019-09-09 NOTE — Progress Notes (Signed)
Cardiology Office Note:    Date:  09/10/2019   ID:  Cassandra Rios, DOB 19-May-1951, MRN [id number]  PCP:  [name], DO  Cardiologist:  Lesleigh Noe, MD   Referring MD: Zola Button, Grayling Congress, *   Chief Complaint  Patient presents with  . Coronary Artery Disease    History of Present Illness:    Cassandra Rios is a 69 y.o. female with a hx AAA, right bundle branch block, exertional dyspnea, chest discomfort, diaphoresis, three-vessel coronary calcification by CT 2019, coronary angiography after coronary CT demonstrating 70% LAD and circumflex lesions February 2021 and near syncope.  She is having a difficult time sleeping.  She feels tired and sleepy all the time.  She snores.  Her overall endurance and physical activity levels have significantly decreased.  She falls asleep frequently during the day.  Angina pectoris/chest pain episodes have significantly decreased.  She has not needed to use any nitroglycerin.  She denies orthopnea PND.  Past Medical History:  Diagnosis Date  . AAA (abdominal aortic aneurysm) (HCC)   . Allergic rhinitis   . Chronic hoarseness   . Colon polyps   . COPD (chronic obstructive pulmonary disease) (HCC)    Dr. Marchelle Gearing  . Dyslipidemia   . Hypertension   . Hypothyroidism   . MS (multiple sclerosis) (HCC)   . Obesity   . Stomach ulcer from aspirin/ibuprofen-like drugs (NSAID's)     Past Surgical History:  Procedure Laterality Date  . ABDOMINAL HYSTERECTOMY    . BARIATRIC SURGERY  09/07/08   lap band  . CARPAL TUNNEL RELEASE    . CATARACT EXTRACTION Right 11/08/2017  . CATARACT EXTRACTION Left 11/22/2017  . CATARACT EXTRACTION, BILATERAL      Toric lenses for astigmatism  . CHOLECYSTECTOMY    . CHOLECYSTECTOMY    . HIATAL HERNIA REPAIR  09/07/08  . LEFT HEART CATH AND CORONARY ANGIOGRAPHY N/A 07/09/2019   Procedure: LEFT HEART CATH AND CORONARY ANGIOGRAPHY;  Surgeon: Lyn Records, MD;  Location: [facility];   Service: Cardiovascular;  Laterality: N/A;    Current Medications: Current Meds  Medication Sig  . acetaminophen (TYLENOL) 500 MG tablet Take 1,000 mg by mouth every 6 (six) hours as needed for moderate pain or headache.  Marland Kitchen aspirin EC 81 MG tablet Take 1 tablet (81 mg total) by mouth daily.  Marland Kitchen atorvastatin (LIPITOR) 80 MG tablet Take 1 tablet (80 mg total) by mouth daily.  . calcium carbonate (TUMS - DOSED IN MG ELEMENTAL CALCIUM) 500 MG chewable tablet Chew 2 tablets by mouth daily as needed for indigestion or heartburn.  . diphenhydramine-acetaminophen (TYLENOL PM) 25-500 MG TABS tablet Take 0.5-1 tablets by mouth at bedtime as needed (sleep).  . gabapentin (NEURONTIN) 300 MG capsule Take 600 mg by mouth every evening.   Marland Kitchen HYDROcodone-acetaminophen (NORCO/VICODIN) 5-325 MG per tablet Take 0.5 tablets by mouth 2 (two) times daily as needed for moderate pain.   . Interferon Beta-1b (BETASERON/EXTAVIA) 0.3 MG KIT injection Inject 0.25 mg into the skin every other day.  . levothyroxine (SYNTHROID) 112 MCG tablet TAKE 1 TABLET (112 MCG TOTAL) BY MOUTH DAILY BEFORE BREAKFAST.  . mometasone (NASONEX) 50 MCG/ACT nasal spray Place 2 sprays into the nose as needed.  . Omega-3 Fatty Acids (FISH OIL) 1000 MG CAPS Take 1 capsule by mouth daily.  Bertram Gala Glycol-Propyl Glycol (SYSTANE ULTRA OP) Place 1 drop into the right eye daily as needed (dry eye).   Marland Kitchen  sertraline (ZOLOFT) 50 MG tablet Take 50 mg by mouth daily.  Marland Kitchen telmisartan-hydrochlorothiazide (MICARDIS HCT) 40-12.5 MG tablet Take 0.5 tablets by mouth daily.  . traMADol (ULTRAM) 50 MG tablet Take 50 mg by mouth every 6 (six) hours as needed.  . triamcinolone ointment (KENALOG) 0.1 % Apply 1 application topically every other day.      Allergies:   Codeine, Hydromorphone hcl, Ibuprofen, Morphine sulfate, and Metronidazole   Social History   Socioeconomic History  . Marital status: Married    Spouse name: Casimiro Needle  . Number of children: 2  .  Years of education: 29  . Highest education level: Not on file  Occupational History  . Occupation: retired  Tobacco Use  . Smoking status: Former Smoker    Packs/day: 2.00    Years: 40.00    Pack years: 80.00    Types: Cigarettes    Quit date: 04/05/2007    Years since quitting: 12.4  . Smokeless tobacco: Never Used  Substance and Sexual Activity  . Alcohol use: No  . Drug use: No  . Sexual activity: Never    Partners: Male  Other Topics Concern  . Not on file  Social History Narrative   Patient is right handed,reside in home with husband   Social Determinants of Health   Financial Resource Strain:   . Difficulty of Paying Living Expenses:   Food Insecurity:   . Worried About Programme researcher, broadcasting/film/video in the Last Year:   . Barista in the Last Year:   Transportation Needs:   . Freight forwarder (Medical):   Marland Kitchen Lack of Transportation (Non-Medical):   Physical Activity:   . Days of Exercise per Week:   . Minutes of Exercise per Session:   Stress:   . Feeling of Stress :   Social Connections:   . Frequency of Communication with Friends and Family:   . Frequency of Social Gatherings with Friends and Family:   . Attends Religious Services:   . Active Member of Clubs or Organizations:   . Attends Banker Meetings:   Marland Kitchen Marital Status:      Family History: The patient's family history includes AAA (abdominal aortic aneurysm) in her father and sister; Arthritis in her sister; COPD in her father and sister; Dementia in her sister; Heart disease in her father and mother; Other in her sister; Stroke (age of onset: 53) in her mother.  ROS:   Please see the history of present illness.    No blood in her urine or stool.  No black or tarry bowel movements.  All other systems reviewed and are negative.  EKGs/Labs/Other Studies Reviewed:    The following studies were reviewed today:  Coronary angiography 07/09/2019: Diagnostic Dominance:  Right      CORONARY CTA 06/2019  IMPRESSION: 1. CT FFR analysis showed significant stenosis in the proximal LCX and possibly significant stenosis in the mid to distal LAD.  2.  Coronary calcium score 1106, 98th percentile for age and gender.  3.  Recommend cardiac catheterization for further evaluation. EKG:  EKG EKG is not repeated today.  Recent Labs: 04/21/2019: TSH 0.82 05/08/2019: NT-Pro BNP 304 07/03/2019: BUN 12; Creatinine, Ser 0.99; Hemoglobin 14.1; Platelets 143; Potassium 3.9; Sodium 138 08/26/2019: ALT 18  Recent Lipid Panel    Component Value Date/Time   CHOL 130 08/26/2019 0731   TRIG 98 08/26/2019 0731   HDL 47 08/26/2019 0731   CHOLHDL 2.8 08/26/2019 0731  CHOLHDL 4 04/21/2019 1036   VLDL 28.2 04/21/2019 1036   LDLCALC 65 08/26/2019 0731    Physical Exam:    VS:  BP (!) 108/58   Pulse 88   Ht 5\' 6"  (1.676 m)   Wt 247 lb 6.4 oz (112.2 kg)   SpO2 97%   BMI 39.93 kg/m     Wt Readings from Last 3 Encounters:  09/10/19 247 lb 6.4 oz (112.2 kg)  07/23/19 246 lb (111.6 kg)  07/09/19 248 lb (112.5 kg)     GEN: Obese. No acute distress HEENT: Normal NECK: No JVD. LYMPHATICS: No lymphadenopathy CARDIAC:  RRR without murmur, gallop, or edema. VASCULAR:  Normal Pulses. No bruits. RESPIRATORY:  Clear to auscultation without rales, wheezing or rhonchi  ABDOMEN: Soft, non-tender, non-distended, No pulsatile mass, MUSCULOSKELETAL: No deformity  SKIN: Warm and dry NEUROLOGIC:  Alert and oriented x 3 PSYCHIATRIC:  Normal affect   ASSESSMENT:    1. Coronary artery disease of native artery of native heart with stable angina pectoris (HCC)   2. Hyperlipidemia LDL goal <100   3. S/P cardiac catheterization   4. MORBID OBESITY   5. Essential hypertension   6. COPD with acute bronchitis (HCC)   7. Educated about COVID-19 virus infection   8. Snoring   9. Daytime sleepiness    PLAN:    In order of problems listed above:  1. Moderate coronary  disease, currently stable on high intensity statin therapy, aspirin, and ARB therapy. 2. LDL target less than 70.  Most recent was 116.  This will need to be rechecked later this year. 3. Cardiac cath as noted above 4. May have sleep apnea.  She snores and has excessive daytime sleepiness.  A sleep study needs to be done. 5. Blood pressures have been running low and we will decrease telmisartan to one half of the 40/12.5 mg tablet each day.  She will monitor blood pressures at home.  She will notify us if she is consistently above 140/80 mmHg. 6. Will be seeing Dr. Marchelle Gearing soon. 7. She has gotten both doses of the COVID-19 vaccine.  Social distancing is being practiced.  Overall education and awareness concerning primary/secondary risk prevention was discussed in detail: LDL less than 70, hemoglobin A1c less than 7, blood pressure target less than 130/80 mmHg, >150 minutes of moderate aerobic activity per week, avoidance of smoking, weight control (via diet and exercise), and continued surveillance/management of/for obstructive sleep apnea.    Medication Adjustments/Labs and Tests Ordered: Current medicines are reviewed at length with the patient today.  Concerns regarding medicines are outlined above.  Orders Placed This Encounter  Procedures  . Split night study   No orders of the defined types were placed in this encounter.   Patient Instructions  Medication Instructions:  Your physician recommends that you continue on your current medications as directed. Please refer to the Current Medication list given to you today.  *If you need a refill on your cardiac medications before your next appointment, please call your pharmacy*   Lab Work: None If you have labs (blood work) drawn today and your tests are completely normal, you will receive your results only by: Marland Kitchen MyChart Message (if you have MyChart) OR . A paper copy in the mail If you have any lab test that is abnormal or we need  to change your treatment, we will call you to review the results.   Testing/Procedures: Your physician has recommended that you have a sleep study. This  test records several body functions during sleep, including: brain activity, eye movement, oxygen and carbon dioxide blood levels, heart rate and rhythm, breathing rate and rhythm, the flow of air through your mouth and nose, snoring, body muscle movements, and chest and belly movement.    Follow-Up: At [facility], you and your health needs are our priority.  As part of our continuing mission to provide you with exceptional heart care, we have created designated Provider Care Teams.  These Care Teams include your primary Cardiologist (physician) and Advanced Practice Providers (APPs -  Physician Assistants and Nurse Practitioners) who all work together to provide you with the care you need, when you need it.  We recommend signing up for the patient portal called "MyChart".  Sign up information is provided on this After Visit Summary.  MyChart is used to connect with patients for Virtual Visits (Telemedicine).  Patients are able to view lab/test results, encounter notes, upcoming appointments, etc.  Non-urgent messages can be sent to your provider as well.   To learn more about what you can do with MyChart, go to [email or web address].    Your next appointment:   4 month(s)  The format for your next appointment:   In Person  Provider:   You may see Lesleigh Noe, MD or one of the following Advanced Practice Providers on your designated Care Team:    Norma Fredrickson, NP  Nada Boozer, NP  Georgie Chard, NP    Other Instructions  Please contact the office if you have increased chest pain with exertion.      Signed, Lesleigh Noe, MD  09/10/2019 2:32 PM    [facility]

## 2019-09-10 ENCOUNTER — Other Ambulatory Visit: Payer: Self-pay

## 2019-09-10 ENCOUNTER — Encounter: Payer: Self-pay | Admitting: Interventional Cardiology

## 2019-09-10 ENCOUNTER — Ambulatory Visit: Payer: PPO | Admitting: Interventional Cardiology

## 2019-09-10 VITALS — BP 108/58 | HR 88 | Ht 66.0 in | Wt 247.4 lb

## 2019-09-10 DIAGNOSIS — I10 Essential (primary) hypertension: Secondary | ICD-10-CM | POA: Diagnosis not present

## 2019-09-10 DIAGNOSIS — R400 Somnolence: Secondary | ICD-10-CM | POA: Diagnosis not present

## 2019-09-10 DIAGNOSIS — I25118 Atherosclerotic heart disease of native coronary artery with other forms of angina pectoris: Secondary | ICD-10-CM

## 2019-09-10 DIAGNOSIS — E785 Hyperlipidemia, unspecified: Secondary | ICD-10-CM | POA: Diagnosis not present

## 2019-09-10 DIAGNOSIS — Z9889 Other specified postprocedural states: Secondary | ICD-10-CM

## 2019-09-10 DIAGNOSIS — J440 Chronic obstructive pulmonary disease with acute lower respiratory infection: Secondary | ICD-10-CM

## 2019-09-10 DIAGNOSIS — J209 Acute bronchitis, unspecified: Secondary | ICD-10-CM | POA: Diagnosis not present

## 2019-09-10 DIAGNOSIS — Z7189 Other specified counseling: Secondary | ICD-10-CM

## 2019-09-10 DIAGNOSIS — R0683 Snoring: Secondary | ICD-10-CM | POA: Diagnosis not present

## 2019-09-10 MED ORDER — TELMISARTAN-HCTZ 40-12.5 MG PO TABS: 0.5000 | ORAL_TABLET | Freq: Every day | ORAL | 3 refills | Status: DC

## 2019-09-10 NOTE — Patient Instructions (Addendum)
Medication Instructions:  1) DECREASE your Telmisartan/HCTZ to a half tablet daily.  *If you need a refill on your cardiac medications before your next appointment, please call your pharmacy*   Lab Work: None If you have labs (blood work) drawn today and your tests are completely normal, you will receive your results only by: Marland Kitchen MyChart Message (if you have MyChart) OR . A paper copy in the mail If you have any lab test that is abnormal or we need to change your treatment, we will call you to review the results.   Testing/Procedures: Your physician has recommended that you have a sleep study. This test records several body functions during sleep, including: brain activity, eye movement, oxygen and carbon dioxide blood levels, heart rate and rhythm, breathing rate and rhythm, the flow of air through your mouth and nose, snoring, body muscle movements, and chest and belly movement.    Follow-Up: At [facility], you and your health needs are our priority.  As part of our continuing mission to provide you with exceptional heart care, we have created designated Provider Care Teams.  These Care Teams include your primary Cardiologist (physician) and Advanced Practice Providers (APPs -  Physician Assistants and Nurse Practitioners) who all work together to provide you with the care you need, when you need it.  We recommend signing up for the patient portal called "MyChart".  Sign up information is provided on this After Visit Summary.  MyChart is used to connect with patients for Virtual Visits (Telemedicine).  Patients are able to view lab/test results, encounter notes, upcoming appointments, etc.  Non-urgent messages can be sent to your provider as well.   To learn more about what you can do with MyChart, go to [email or web address].    Your next appointment:   4-6 month(s)  The format for your next appointment:   In Person  Provider:   You may see Sinclair Grooms, MD or one of the  following Advanced Practice Providers on your designated Care Team:    Truitt Merle, NP  Cecilie Kicks, NP  Kathyrn Drown, NP    Other Instructions  Please contact the office if you have increased chest pain with exertion.

## 2019-09-11 ENCOUNTER — Telehealth: Payer: Self-pay | Admitting: *Deleted

## 2019-09-11 NOTE — Telephone Encounter (Signed)
-----   Message from Loren Racer, RN sent at 09/10/2019  2:40 PM EDT ----- Sleep study ordered

## 2019-10-17 DIAGNOSIS — Z79899 Other long term (current) drug therapy: Secondary | ICD-10-CM | POA: Diagnosis not present

## 2019-10-17 DIAGNOSIS — L900 Lichen sclerosus et atrophicus: Secondary | ICD-10-CM | POA: Diagnosis not present

## 2019-10-17 DIAGNOSIS — L292 Pruritus vulvae: Secondary | ICD-10-CM | POA: Diagnosis not present

## 2019-10-21 ENCOUNTER — Encounter: Payer: Self-pay | Admitting: Family Medicine

## 2019-10-21 ENCOUNTER — Ambulatory Visit (INDEPENDENT_AMBULATORY_CARE_PROVIDER_SITE_OTHER): Payer: PPO | Admitting: Family Medicine

## 2019-10-21 ENCOUNTER — Other Ambulatory Visit: Payer: Self-pay

## 2019-10-21 VITALS — BP 122/68 | HR 66 | Temp 97.9°F | Resp 18 | Ht 66.0 in | Wt 244.8 lb

## 2019-10-21 DIAGNOSIS — J309 Allergic rhinitis, unspecified: Secondary | ICD-10-CM | POA: Diagnosis not present

## 2019-10-21 DIAGNOSIS — I10 Essential (primary) hypertension: Secondary | ICD-10-CM | POA: Diagnosis not present

## 2019-10-21 DIAGNOSIS — Z86010 Personal history of colonic polyps: Secondary | ICD-10-CM | POA: Diagnosis not present

## 2019-10-21 DIAGNOSIS — Z0000 Encounter for general adult medical examination without abnormal findings: Secondary | ICD-10-CM

## 2019-10-21 DIAGNOSIS — E785 Hyperlipidemia, unspecified: Secondary | ICD-10-CM

## 2019-10-21 DIAGNOSIS — M47814 Spondylosis without myelopathy or radiculopathy, thoracic region: Secondary | ICD-10-CM

## 2019-10-21 DIAGNOSIS — E039 Hypothyroidism, unspecified: Secondary | ICD-10-CM

## 2019-10-21 DIAGNOSIS — Z79899 Other long term (current) drug therapy: Secondary | ICD-10-CM

## 2019-10-21 DIAGNOSIS — J069 Acute upper respiratory infection, unspecified: Secondary | ICD-10-CM

## 2019-10-21 LAB — COMPREHENSIVE METABOLIC PANEL
ALT: 16 U/L (ref 0–35)
AST: 20 U/L (ref 0–37)
Albumin: 4 g/dL (ref 3.5–5.2)
Alkaline Phosphatase: 75 U/L (ref 39–117)
BUN: 13 mg/dL (ref 6–23)
CO2: 29 mEq/L (ref 19–32)
Calcium: 9.4 mg/dL (ref 8.4–10.5)
Chloride: 102 mEq/L (ref 96–112)
Creatinine, Ser: 0.93 mg/dL (ref 0.40–1.20)
GFR: 59.65 mL/min — ABNORMAL LOW (ref >60.00)
Glucose, Bld: 107 mg/dL — ABNORMAL HIGH (ref 70–99)
Potassium: 4.3 mEq/L (ref 3.5–5.1)
Sodium: 136 mEq/L (ref 135–145)
Total Bilirubin: 0.6 mg/dL (ref 0.2–1.2)
Total Protein: 6.6 g/dL (ref 6.0–8.3)

## 2019-10-21 LAB — LIPID PANEL
Cholesterol: 143 mg/dL (ref 0–200)
HDL: 42.9 mg/dL (ref >39.00)
LDL Cholesterol: 76 mg/dL (ref 0–99)
NonHDL: 100.47
Total CHOL/HDL Ratio: 3
Triglycerides: 124 mg/dL (ref 0.0–149.0)
VLDL: 24.8 mg/dL (ref 0.0–40.0)

## 2019-10-21 MED ORDER — TRAMADOL HCL 50 MG PO TABS: 50.0000 mg | ORAL_TABLET | Freq: Four times a day (QID) | ORAL | 0 refills | Status: DC | PRN

## 2019-10-21 MED ORDER — LEVOCETIRIZINE DIHYDROCHLORIDE 5 MG PO TABS: 5.0000 mg | ORAL_TABLET | Freq: Every evening | ORAL | 5 refills | Status: DC

## 2019-10-21 MED ORDER — FLUTICASONE PROPIONATE 50 MCG/ACT NA SUSP: 2.0000 | Freq: Every day | NASAL | 6 refills | Status: DC

## 2019-10-21 NOTE — Progress Notes (Signed)
Subjective:     Cassandra Rios is a 69 y.o. female and is here for a comprehensive physical exam. The patient reports problems with pnd and allergies.  She has not taken otc yet.    Social History   Socioeconomic History  . Marital status: Married    Spouse name: Legrand Como  . Number of children: 2  . Years of education: 1  . Highest education level: Not on file  Occupational History  . Occupation: retired  Tobacco Use  . Smoking status: Former Smoker    Packs/day: 2.00    Years: 40.00    Pack years: 80.00    Types: Cigarettes    Quit date: 04/05/2007    Years since quitting: 12.5  . Smokeless tobacco: Never Used  Substance and Sexual Activity  . Alcohol use: No  . Drug use: No  . Sexual activity: Never    Partners: Male  Other Topics Concern  . Not on file  Social History Narrative   Patient is right handed,reside in home with husband   Social Determinants of Health   Financial Resource Strain:   . Difficulty of Paying Living Expenses:   Food Insecurity:   . Worried About Charity fundraiser in the Last Year:   . Arboriculturist in the Last Year:   Transportation Needs:   . Film/video editor (Medical):   Marland Kitchen Lack of Transportation (Non-Medical):   Physical Activity:   . Days of Exercise per Week:   . Minutes of Exercise per Session:   Stress:   . Feeling of Stress :   Social Connections:   . Frequency of Communication with Friends and Family:   . Frequency of Social Gatherings with Friends and Family:   . Attends Religious Services:   . Active Member of Clubs or Organizations:   . Attends Archivist Meetings:   Marland Kitchen Marital Status:   Intimate Partner Violence:   . Fear of Current or Ex-Partner:   . Emotionally Abused:   Marland Kitchen Physically Abused:   . Sexually Abused:    Health Maintenance  Topic Date Due  . COLONOSCOPY  05/19/2019  . INFLUENZA VACCINE  01/04/2020  . MAMMOGRAM  06/08/2020  . TETANUS/TDAP  03/06/2021  . DEXA SCAN  Completed  .  COVID-19 Vaccine  Completed  . Hepatitis C Screening  Completed  . PNA vac Low Risk Adult  Completed    The following portions of the patient's history were reviewed and updated as appropriate:  She  has a past medical history of AAA (abdominal aortic aneurysm) ([place]), Allergic rhinitis, Chronic hoarseness, Colon polyps, COPD (chronic obstructive pulmonary disease) ([place]), Dyslipidemia, Hypertension, Hypothyroidism, MS (multiple sclerosis) ([place]), Obesity, and Stomach ulcer from aspirin/ibuprofen-like drugs (NSAID's). She does not have any pertinent problems on file. She  has a past surgical history that includes Cholecystectomy; Carpal tunnel release; Bariatric Surgery (09/07/08); Hiatal hernia repair (09/07/08); Abdominal hysterectomy; Cholecystectomy; Cataract extraction, bilateral; Cataract extraction (Right, 11/08/2017); Cataract extraction (Left, 11/22/2017); and LEFT HEART CATH AND CORONARY ANGIOGRAPHY (N/A, 07/09/2019). Her family history includes AAA (abdominal aortic aneurysm) in her father and sister; Arthritis in her sister; COPD in her father and sister; Dementia in her sister; Heart disease in her father and mother; Other in her sister; Stroke (age of onset: 65) in her mother. She  reports that she quit smoking about 12 years ago. Her smoking use included cigarettes. She has a 80.00 pack-year smoking history. She has never used smokeless tobacco.  She reports that she does not drink alcohol or use drugs. She has a current medication list which includes the following prescription(s): acetaminophen, aspirin ec, calcium carbonate, diphenhydramine-acetaminophen, gabapentin, hydrocodone-acetaminophen, interferon beta-1b, levothyroxine, mometasone, fish oil, sertraline, telmisartan-hydrochlorothiazide, tramadol, triamcinolone ointment, atorvastatin, fluticasone, levocetirizine, and polyethyl glycol-propyl glycol. Current Outpatient Medications on File Prior to Visit  Medication Sig Dispense Refill  .  acetaminophen (TYLENOL) 500 MG tablet Take 1,000 mg by mouth every 6 (six) hours as needed for moderate pain or headache.    Marland Kitchen aspirin EC 81 MG tablet Take 1 tablet (81 mg total) by mouth daily. 90 tablet 3  . calcium carbonate (TUMS - DOSED IN MG ELEMENTAL CALCIUM) 500 MG chewable tablet Chew 2 tablets by mouth daily as needed for indigestion or heartburn.    . diphenhydramine-acetaminophen (TYLENOL PM) 25-500 MG TABS tablet Take 0.5-1 tablets by mouth at bedtime as needed (sleep).    . gabapentin (NEURONTIN) 300 MG capsule Take 600 mg by mouth every evening.     Marland Kitchen HYDROcodone-acetaminophen (NORCO/VICODIN) 5-325 MG per tablet Take 0.5 tablets by mouth 2 (two) times daily as needed for moderate pain.     . Interferon Beta-1b (BETASERON/EXTAVIA) 0.3 MG KIT injection Inject 0.25 mg into the skin every other day.    . levothyroxine (SYNTHROID) 112 MCG tablet TAKE 1 TABLET (112 MCG TOTAL) BY MOUTH DAILY BEFORE BREAKFAST. 90 tablet 0  . mometasone (NASONEX) 50 MCG/ACT nasal spray Place 2 sprays into the nose as needed.    . Omega-3 Fatty Acids (FISH OIL) 1000 MG CAPS Take 1 capsule by mouth daily.    . sertraline (ZOLOFT) 50 MG tablet Take 50 mg by mouth daily.    Marland Kitchen telmisartan-hydrochlorothiazide (MICARDIS HCT) 40-12.5 MG tablet Take 0.5 tablets by mouth daily. 45 tablet 3  . triamcinolone ointment (KENALOG) 0.1 % Apply 1 application topically every other day.   5  . atorvastatin (LIPITOR) 80 MG tablet Take 1 tablet (80 mg total) by mouth daily. 90 tablet 3  . Polyethyl Glycol-Propyl Glycol (SYSTANE ULTRA OP) Place 1 drop into the right eye daily as needed (dry eye).      No current facility-administered medications on file prior to visit.   She is allergic to codeine; hydromorphone hcl; ibuprofen; morphine sulfate; and metronidazole..  Review of Systems   Review of Systems  Constitutional: Negative for activity change, appetite change and fatigue.  HENT: Negative for hearing loss, congestion,  tinnitus and ear discharge.   Eyes: Negative for visual disturbance (see optho q1y -- vision corrected to 20/20 with glasses).  Respiratory: Negative for cough, chest tightness and shortness of breath.   Cardiovascular: Negative for chest pain, palpitations and leg swelling.  Gastrointestinal: Negative for abdominal pain, diarrhea, constipation and abdominal distention.  Genitourinary: Negative for urgency, frequency, decreased urine volume and difficulty urinating.  Musculoskeletal: Negative for back pain, arthralgias and gait problem.  Skin: Negative for color change, pallor and rash.  Neurological: Negative for dizziness, light-headedness, numbness and headaches.  Hematological: Negative for adenopathy. Does not bruise/bleed easily.  Psychiatric/Behavioral: Negative for suicidal ideas, confusion, sleep disturbance, self-injury, dysphoric mood, decreased concentration and agitation.  Pt is able to read and write and can do all ADLs No risk for falling No abuse/ violence in home    Objective:    BP 122/68 (BP Location: Right Arm, Patient Position: Sitting, Cuff Size: Large)   Pulse 66   Temp 97.9 F (36.6 C) (Temporal)   Resp 18   Ht 5' 6"  (1.676  m)   Wt 244 lb 12.8 oz (111 kg)   SpO2 99%   BMI 39.51 kg/m  General appearance: alert, cooperative, appears stated age and no distress Head: Normocephalic, without obvious abnormality, atraumatic Eyes: negative findings: lids and lashes normal, conjunctivae and sclerae normal and pupils equal, round, reactive to light and accomodation Ears: normal TM's and external ear canals both ears Neck: no adenopathy, no carotid bruit, no JVD, supple, symmetrical, trachea midline and thyroid not enlarged, symmetric, no tenderness/mass/nodules Back: symmetric, no curvature. ROM normal. No CVA tenderness. Lungs: clear to auscultation bilaterally Breasts: normal appearance, no masses or tenderness Heart: S1, S2 normal Abdomen: soft, non-tender; bowel  sounds normal; no masses,  no organomegaly Pelvic: not indicated; status post hysterectomy, negative ROS Extremities: extremities normal, atraumatic, no cyanosis or edema Pulses: 2+ and symmetric Skin: Skin color, texture, turgor normal. No rashes or lesions Lymph nodes: Cervical, supraclavicular, and axillary nodes normal. Neurologic: Alert and oriented X 3, normal strength and tone. Normal symmetric reflexes. Normal coordination and gait    Assessment:    Healthy female exam.      Plan:    ghm utd Check labs See After Visit Summary for Counseling Recommendations    1. Spondylosis of thoracic region without myelopathy or radiculopathy  uds being done Contract updated Database reviewed - traMADol (ULTRAM) 50 MG tablet; Take 1 tablet (50 mg total) by mouth every 6 (six) hours as needed.  Dispense: 30 tablet; Refill: 0  2. History of colon polyps   - Ambulatory referral to Gastroenterology  3. Hypothyroidism, unspecified type Check labs  con't meds  - TSH  4. Hyperlipidemia, unspecified hyperlipidemia type Tolerating statin, encouraged heart healthy diet, avoid trans fats, minimize simple carbs and saturated fats. Increase exercise as tolerated  - Lipid panel - Comprehensive metabolic panel  5. Essential hypertension Well controlled, no changes to meds. Encouraged heart healthy diet such as the DASH diet and exercise as tolerated.   - Lipid panel - Comprehensive metabolic panel  6. Viral upper respiratory tract infection   - levocetirizine (XYZAL) 5 MG tablet; Take 1 tablet (5 mg total) by mouth every evening.  Dispense: 30 tablet; Refill: 5 - fluticasone (FLONASE) 50 MCG/ACT nasal spray; Place 2 sprays into both nostrils daily.  Dispense: 16 g; Refill: 6  7. Allergic rhinitis, unspecified seasonality, unspecified trigger See above   8. Preventative health care See above

## 2019-10-21 NOTE — Addendum Note (Signed)
Addended by: Sanda Linger on: 10/21/2019 10:22 AM   Modules accepted: Orders

## 2019-10-21 NOTE — Patient Instructions (Signed)
Preventive Care 70 Years and Older, Female Preventive care refers to lifestyle choices and visits with your health care provider that can promote health and wellness. This includes:  A yearly physical exam. This is also called an annual well check.  Regular dental and eye exams.  Immunizations.  Screening for certain conditions.  Healthy lifestyle choices, such as diet and exercise. What can I expect for my preventive care visit? Physical exam Your health care provider will check:  Height and weight. These may be used to calculate body mass index (BMI), which is a measurement that tells if you are at a healthy weight.  Heart rate and blood pressure.  Your skin for abnormal spots. Counseling Your health care provider may ask you questions about:  Alcohol, tobacco, and drug use.  Emotional well-being.  Home and relationship well-being.  Sexual activity.  Eating habits.  History of falls.  Memory and ability to understand (cognition).  Work and work Statistician.  Pregnancy and menstrual history. What immunizations do I need?  Influenza (flu) vaccine  This is recommended every year. Tetanus, diphtheria, and pertussis (Tdap) vaccine  You may need a Td booster every 10 years. Varicella (chickenpox) vaccine  You may need this vaccine if you have not already been vaccinated. Zoster (shingles) vaccine  You may need this after age 70. Pneumococcal conjugate (PCV13) vaccine  One dose is recommended after age 70. Pneumococcal polysaccharide (PPSV23) vaccine  One dose is recommended after age 70. Measles, mumps, and rubella (MMR) vaccine  You may need at least one dose of MMR if you were born in 1957 or later. You may also need a second dose. Meningococcal conjugate (MenACWY) vaccine  You may need this if you have certain conditions. Hepatitis A vaccine  You may need this if you have certain conditions or if you travel or work in places where you may be exposed  to hepatitis A. Hepatitis B vaccine  You may need this if you have certain conditions or if you travel or work in places where you may be exposed to hepatitis B. Haemophilus influenzae type b (Hib) vaccine  You may need this if you have certain conditions. You may receive vaccines as individual doses or as more than one vaccine together in one shot (combination vaccines). Talk with your health care provider about the risks and benefits of combination vaccines. What tests do I need? Blood tests  Lipid and cholesterol levels. These may be checked every 5 years, or more frequently depending on your overall health.  Hepatitis C test.  Hepatitis B test. Screening  Lung cancer screening. You may have this screening every year starting at age 70 if you have a 30-pack-year history of smoking and currently smoke or have quit within the past 15 years.  Colorectal cancer screening. All adults should have this screening starting at age 70 and continuing until age 15. Your health care provider may recommend screening at age 70 if you are at increased risk. You will have tests every 1-10 years, depending on your results and the type of screening test.  Diabetes screening. This is done by checking your blood sugar (glucose) after you have not eaten for a while (fasting). You may have this done every 1-70 years.  Mammogram. This may be done every 1-2 years. Talk with your health care provider about how often you should have regular mammograms.  BRCA-related cancer screening. This may be done if you have a family history of breast, ovarian, tubal, or peritoneal cancers.  Other tests  Sexually transmitted disease (STD) testing.  Bone density scan. This is done to screen for osteoporosis. You may have this done starting at age 70. Follow these instructions at home: Eating and drinking  Eat a diet that includes fresh fruits and vegetables, whole grains, lean protein, and low-fat dairy products. Limit  your intake of foods with high amounts of sugar, saturated fats, and salt.  Take vitamin and mineral supplements as recommended by your health care provider.  Do not drink alcohol if your health care provider tells you not to drink.  If you drink alcohol: ? Limit how much you have to 0-1 drink a day. ? Be aware of how much alcohol is in your drink. In the [place] one drink equals one 12 oz bottle of beer (355 mL), one 5 oz glass of wine (148 mL), or one 1 oz glass of hard liquor (44 mL). Lifestyle  Take daily care of your teeth and gums.  Stay active. Exercise for at least 30 minutes on 5 or more days each week.  Do not use any products that contain nicotine or tobacco, such as cigarettes, e-cigarettes, and chewing tobacco. If you need help quitting, ask your health care provider.  If you are sexually active, practice safe sex. Use a condom or other form of protection in order to prevent STIs (sexually transmitted infections).  Talk with your health care provider about taking a low-dose aspirin or statin. What's next?  Go to your health care provider once a year for a well check visit.  Ask your health care provider how often you should have your eyes and teeth checked.  Stay up to date on all vaccines. This information is not intended to replace advice given to you by your health care provider. Make sure you discuss any questions you have with your health care provider. Document Revised: 05/16/2018 Document Reviewed: 05/16/2018 Elsevier Patient Education  2020 [employer].

## 2019-10-21 NOTE — Assessment & Plan Note (Signed)
Start xyzal and flonase again F/u prn  Can use nasal saline prn

## 2019-10-21 NOTE — Assessment & Plan Note (Signed)
Check labs  Discuss shingrix ghm utd Colonoscopy will be done this summer

## 2019-10-21 NOTE — Telephone Encounter (Signed)
  Linnell Fulling, [place]; P Cv Div Sleep Studies  Cc: Newt Minion, RN; Dionicio Stall, RN  Approved and can now be scheduled.   Thank you,  [place]

## 2019-10-22 LAB — TSH: TSH: 0.93 u[IU]/mL (ref 0.35–4.50)

## 2019-10-22 NOTE — Telephone Encounter (Signed)
Patient is scheduled for lab study on 11/10/19. pt is scheduled for COVID screening on 11/07/19 prior to SS.  Patient understands her sleep study will be done at [facility] sleep lab. Patient understands she will receive a sleep packet in a week or so. Patient understands to call if she does not receive the sleep packet in a timely manner.  Left detailed message on voicemail with date and time of titration and informed patient to call back to confirm or reschedule.

## 2019-10-26 LAB — DRUG TOX MONITOR 1 W/CONF, ORAL FLD

## 2019-11-02 ENCOUNTER — Other Ambulatory Visit: Payer: Self-pay | Admitting: Family Medicine

## 2019-11-07 ENCOUNTER — Other Ambulatory Visit (HOSPITAL_COMMUNITY): Payer: PPO

## 2019-11-10 ENCOUNTER — Encounter (HOSPITAL_BASED_OUTPATIENT_CLINIC_OR_DEPARTMENT_OTHER): Payer: PPO | Admitting: Cardiology

## 2019-11-14 NOTE — Telephone Encounter (Signed)
Patient called back to cancel on 10/28/19.

## 2019-11-17 ENCOUNTER — Ambulatory Visit: Payer: PPO | Admitting: Adult Health

## 2019-11-17 ENCOUNTER — Telehealth: Payer: Self-pay | Admitting: Adult Health

## 2019-11-17 ENCOUNTER — Other Ambulatory Visit: Payer: Self-pay

## 2019-11-17 ENCOUNTER — Encounter: Payer: Self-pay | Admitting: Adult Health

## 2019-11-17 VITALS — BP 146/88 | HR 84 | Ht 66.0 in | Wt 248.0 lb

## 2019-11-17 DIAGNOSIS — R200 Anesthesia of skin: Secondary | ICD-10-CM

## 2019-11-17 DIAGNOSIS — G35 Multiple sclerosis: Secondary | ICD-10-CM | POA: Diagnosis not present

## 2019-11-17 DIAGNOSIS — R202 Paresthesia of skin: Secondary | ICD-10-CM | POA: Diagnosis not present

## 2019-11-17 NOTE — Progress Notes (Signed)
PATIENT: Cassandra Rios DOB: Nov 22, 1950  REASON FOR VISIT: follow up HISTORY FROM: patient  HISTORY OF PRESENT ILLNESS: Today 11/17/19:  Cassandra Rios is a 69 year old female with a history of multiple sclerosis.  She returns today for follow-up.  She remains on Betaseron.  She denies any changes with her gait or balance.  No changes with her vision.  Denies any changes with the bowels or bladder.  She does report new tingling sensation in the lower extremities bilaterally.  She describes this as a pins and needle sensation.  Reports that the sensation comes and goes.  Seems to be worse when she sits down.  She does take gabapentin at bedtime and finds it helpful.  She denies being diabetic although her recent lab work shows elevated glucose level.  I do not see a recent hemoglobin A1c.  She returns today for an evaluation.  HISTORY 05/14/19:  Cassandra Rios is a 69 year old female with a history of multiple sclerosis.  She returns today for follow-up.  She remains on Betaseron.  She denies any new symptoms.  No new numbness or tingling.  No changes with her gait or balance.  No change with the bowels or bladder.  No changes with her vision.  She states that she has been having shortness of breath.  Her cardiologist has ordered a CT of the heart.  She denies any other symptoms.  She returns today for an evaluation.  REVIEW OF SYSTEMS: Out of a complete 14 system review of symptoms, the patient complains only of the following symptoms, and all other reviewed systems are negative.  See HPI  ALLERGIES: Allergies  Allergen Reactions  . Codeine Itching and Nausea And Vomiting  . Hydromorphone Hcl Nausea And Vomiting  . Ibuprofen Other (See Comments)    stomach ulcers  . Morphine Sulfate     "makes my blood pressure bottom out"  . Metronidazole Hives, Nausea And Vomiting and Rash    HOME MEDICATIONS: Outpatient Medications Prior to Visit  Medication Sig Dispense Refill  .  acetaminophen (TYLENOL) 500 MG tablet Take 1,000 mg by mouth every 6 (six) hours as needed for moderate pain or headache.    Marland Kitchen aspirin EC 81 MG tablet Take 1 tablet (81 mg total) by mouth daily. 90 tablet 3  . calcium carbonate (TUMS - DOSED IN MG ELEMENTAL CALCIUM) 500 MG chewable tablet Chew 2 tablets by mouth daily as needed for indigestion or heartburn.    . diphenhydramine-acetaminophen (TYLENOL PM) 25-500 MG TABS tablet Take 0.5-1 tablets by mouth at bedtime as needed (sleep).    . fluticasone (FLONASE) 50 MCG/ACT nasal spray Place 2 sprays into both nostrils daily. 16 g 6  . gabapentin (NEURONTIN) 300 MG capsule Take 600 mg by mouth every evening.     Marland Kitchen HYDROcodone-acetaminophen (NORCO/VICODIN) 5-325 MG per tablet Take 0.5 tablets by mouth 2 (two) times daily as needed for moderate pain.     . Interferon Beta-1b (BETASERON/EXTAVIA) 0.3 MG KIT injection Inject 0.25 mg into the skin every other day.    . levocetirizine (XYZAL) 5 MG tablet Take 1 tablet (5 mg total) by mouth every evening. 30 tablet 5  . levothyroxine (SYNTHROID) 112 MCG tablet TAKE 1 TABLET (112 MCG TOTAL) BY MOUTH DAILY BEFORE BREAKFAST. 90 tablet 0  . mometasone (NASONEX) 50 MCG/ACT nasal spray Place 2 sprays into the nose as needed.    . Omega-3 Fatty Acids (FISH OIL) 1000 MG CAPS Take 1 capsule by mouth daily.    Marland Kitchen  Polyethyl Glycol-Propyl Glycol (SYSTANE ULTRA OP) Place 1 drop into the right eye daily as needed (dry eye).     Marland Kitchen sertraline (ZOLOFT) 50 MG tablet Take 50 mg by mouth daily.    Marland Kitchen telmisartan-hydrochlorothiazide (MICARDIS HCT) 40-12.5 MG tablet Take 0.5 tablets by mouth daily. 45 tablet 3  . traMADol (ULTRAM) 50 MG tablet Take 1 tablet (50 mg total) by mouth every 6 (six) hours as needed. 30 tablet 0  . triamcinolone ointment (KENALOG) 0.1 % Apply 1 application topically every other day.   5  . atorvastatin (LIPITOR) 80 MG tablet Take 1 tablet (80 mg total) by mouth daily. 90 tablet 3   No facility-administered  medications prior to visit.    PAST MEDICAL HISTORY: Past Medical History:  Diagnosis Date  . AAA (abdominal aortic aneurysm) (HCC)   . Allergic rhinitis   . Chronic hoarseness   . Colon polyps   . COPD (chronic obstructive pulmonary disease) (HCC)    Dr. Marchelle Gearing  . Dyslipidemia   . Hypertension   . Hypothyroidism   . MS (multiple sclerosis) (HCC)   . Obesity   . Stomach ulcer from aspirin/ibuprofen-like drugs (NSAID's)     PAST SURGICAL HISTORY: Past Surgical History:  Procedure Laterality Date  . ABDOMINAL HYSTERECTOMY    . BARIATRIC SURGERY  09/07/08   lap band  . CARPAL TUNNEL RELEASE    . CATARACT EXTRACTION Right 11/08/2017  . CATARACT EXTRACTION Left 11/22/2017  . CATARACT EXTRACTION, BILATERAL      Toric lenses for astigmatism  . CHOLECYSTECTOMY    . CHOLECYSTECTOMY    . HIATAL HERNIA REPAIR  09/07/08  . LEFT HEART CATH AND CORONARY ANGIOGRAPHY N/A 07/09/2019   Procedure: LEFT HEART CATH AND CORONARY ANGIOGRAPHY;  Surgeon: Lyn Records, MD;  Location: [facility] INVASIVE CV LAB;  Service: Cardiovascular;  Laterality: N/A;    FAMILY HISTORY: Family History  Problem Relation Age of Onset  . COPD Father   . Heart disease Father   . AAA (abdominal aortic aneurysm) Father   . COPD Sister   . Arthritis Sister   . AAA (abdominal aortic aneurysm) Sister   . Dementia Sister   . Other Sister        brain injury  . Stroke Mother 68  . Heart disease Mother        congenital heart defect?    SOCIAL HISTORY: Social History   Socioeconomic History  . Marital status: Married    Spouse name: Casimiro Needle  . Number of children: 2  . Years of education: 68  . Highest education level: Not on file  Occupational History  . Occupation: retired  Tobacco Use  . Smoking status: Former Smoker    Packs/day: 2.00    Years: 40.00    Pack years: 80.00    Types: Cigarettes    Quit date: 04/05/2007    Years since quitting: 12.6  . Smokeless tobacco: Never Used  Vaping Use  .  Vaping Use: Never assessed  Substance and Sexual Activity  . Alcohol use: No  . Drug use: No  . Sexual activity: Never    Partners: Male  Other Topics Concern  . Not on file  Social History Narrative   Patient is right handed,reside in home with husband   Social Determinants of Health   Financial Resource Strain:   . Difficulty of Paying Living Expenses:   Food Insecurity:   . Worried About Programme researcher, broadcasting/film/video in the Last Year:   .  Ran Out of Food in the Last Year:   Transportation Needs:   . Freight forwarder (Medical):   Marland Kitchen Lack of Transportation (Non-Medical):   Physical Activity:   . Days of Exercise per Week:   . Minutes of Exercise per Session:   Stress:   . Feeling of Stress :   Social Connections:   . Frequency of Communication with Friends and Family:   . Frequency of Social Gatherings with Friends and Family:   . Attends Religious Services:   . Active Member of Clubs or Organizations:   . Attends Banker Meetings:   Marland Kitchen Marital Status:   Intimate Partner Violence:   . Fear of Current or Ex-Partner:   . Emotionally Abused:   Marland Kitchen Physically Abused:   . Sexually Abused:       PHYSICAL EXAM  Vitals:   11/17/19 0934  BP: (!) 146/88  Pulse: 84  Weight: 248 lb (112.5 kg)  Height: 5\' 6"  (1.676 m)   Body mass index is 40.03 kg/m.  Generalized: Well developed, in no acute distress   Neurological examination  Mentation: Alert oriented to time, place, history taking. Follows all commands speech and language fluent Cranial nerve II-XII: Pupils were equal round reactive to light. Extraocular movements were full, visual field were full on confrontational test.  Head turning and shoulder shrug  were normal and symmetric. Motor: The motor testing reveals 5 over 5 strength of all 4 extremities. Good symmetric motor tone is noted throughout.  Sensory: Sensory testing is intact to soft touch on all 4 extremities. No evidence of extinction is noted.   Coordination: Cerebellar testing reveals good finger-nose-finger and heel-to-shin bilaterally.  Gait and station: Gait is normal. T   DIAGNOSTIC DATA (LABS, IMAGING, TESTING) - I reviewed patient records, labs, notes, testing and imaging myself where available.  Lab Results  Component Value Date   WBC 6.4 07/03/2019   HGB 14.1 07/03/2019   HCT 42.0 07/03/2019   MCV 88 07/03/2019   PLT 143 (L) 07/03/2019      Component Value Date/Time   NA 136 10/21/2019 0953   NA 138 07/03/2019 1515   K 4.3 10/21/2019 0953   CL 102 10/21/2019 0953   CO2 29 10/21/2019 0953   GLUCOSE 107 (H) 10/21/2019 0953   BUN 13 10/21/2019 0953   BUN 12 07/03/2019 1515   CREATININE 0.93 10/21/2019 0953   CREATININE 1.04 05/13/2014 0840   CALCIUM 9.4 10/21/2019 0953   PROT 6.6 10/21/2019 0953   PROT 6.2 08/26/2019 0731   ALBUMIN 4.0 10/21/2019 0953   ALBUMIN 3.9 08/26/2019 0731   AST 20 10/21/2019 0953   ALT 16 10/21/2019 0953   ALKPHOS 75 10/21/2019 0953   BILITOT 0.6 10/21/2019 0953   BILITOT 0.3 08/26/2019 0731   GFRNONAA 58 (L) 07/03/2019 1515   GFRNONAA 57 (L) 05/13/2014 0840   GFRAA 67 07/03/2019 1515   GFRAA 66 05/13/2014 0840   Lab Results  Component Value Date   CHOL 143 10/21/2019   HDL 42.90 10/21/2019   LDLCALC 76 10/21/2019   TRIG 124.0 10/21/2019   CHOLHDL 3 10/21/2019   Lab Results  Component Value Date   HGBA1C 5.9 09/25/2016   Lab Results  Component Value Date   VITAMINB12 648 03/16/2010   Lab Results  Component Value Date   TSH 0.93 10/21/2019      ASSESSMENT AND PLAN 69 y.o. year old female  has a past medical history of AAA (abdominal aortic aneurysm) (  HCC), Allergic rhinitis, Chronic hoarseness, Colon polyps, COPD (chronic obstructive pulmonary disease) (HCC), Dyslipidemia, Hypertension, Hypothyroidism, MS (multiple sclerosis) (HCC), Obesity, and Stomach ulcer from aspirin/ibuprofen-like drugs (NSAID's). here with:  Multiple sclerosis   Continue  Betaseron  Recent blood work through her PCP was relatively unremarkable  MRI of the brain with and without contrast  Tingling sensation in the lower extremities   If MRI is normal we will consider further work-up  Advised patient if her symptoms worsen or she develops new symptoms she should let us know follow-up in 6 months or sooner if needed   I spent 20 minutes of face-to-face and non-face-to-face time with patient.  This included previsit chart review, lab review, study review, order entry, electronic health record documentation, patient education.  Butch Penny, MSN, NP-C 11/17/2019, 10:11 AM [facility] [address] [place], [place] [address] [phone]

## 2019-11-17 NOTE — Telephone Encounter (Signed)
health team order sent to GI. No auth they will reach out to the patient to schedule.  

## 2019-11-17 NOTE — Patient Instructions (Signed)
Your Plan:  Continue Betaseron  MRI brain  If your symptoms worsen or you develop new symptoms please let us know.   Thank you for coming to see [name] at [facility]. I hope we have been able to provide you high quality care today.  You may receive a patient satisfaction survey over the next few weeks. We would appreciate your feedback and comments so that we may continue to improve ourselves and the health of our patients.

## 2019-11-27 ENCOUNTER — Other Ambulatory Visit: Payer: Self-pay

## 2019-11-27 ENCOUNTER — Ambulatory Visit
Admission: RE | Admit: 2019-11-27 | Discharge: 2019-11-27 | Disposition: A | Discharge status: Home / Self Care | Payer: PPO | Source: Ambulatory Visit | Attending: Adult Health | Admitting: Adult Health

## 2019-11-27 DIAGNOSIS — G35 Multiple sclerosis: Secondary | ICD-10-CM | POA: Diagnosis not present

## 2019-11-27 MED ORDER — GADOBENATE DIMEGLUMINE 529 MG/ML IV SOLN
20.0000 mL | Freq: Once | INTRAVENOUS | Status: AC | PRN
  Administered 2019-11-27: 20 mL via INTRAVENOUS

## 2019-12-02 ENCOUNTER — Telehealth: Payer: Self-pay

## 2019-12-02 NOTE — Telephone Encounter (Signed)
-----   Message from Ward Givens, NP sent at 12/01/2019  1:34 PM EDT ----- MRI is stable

## 2019-12-02 NOTE — Telephone Encounter (Signed)
Pt verified by name and DOB, results given per provider, pt voiced understanding all question answered. 

## 2019-12-02 NOTE — Telephone Encounter (Signed)
Pt verified by name and DOB,  normal results given per provider, pt voiced understanding.  

## 2019-12-10 ENCOUNTER — Other Ambulatory Visit: Payer: Self-pay | Admitting: Family Medicine

## 2019-12-10 DIAGNOSIS — F418 Other specified anxiety disorders: Secondary | ICD-10-CM

## 2019-12-30 DIAGNOSIS — H26493 Other secondary cataract, bilateral: Secondary | ICD-10-CM | POA: Diagnosis not present

## 2019-12-30 DIAGNOSIS — H35371 Puckering of macula, right eye: Secondary | ICD-10-CM | POA: Diagnosis not present

## 2019-12-30 DIAGNOSIS — H04123 Dry eye syndrome of bilateral lacrimal glands: Secondary | ICD-10-CM | POA: Diagnosis not present

## 2019-12-30 DIAGNOSIS — H524 Presbyopia: Secondary | ICD-10-CM | POA: Diagnosis not present

## 2020-01-22 DIAGNOSIS — H26492 Other secondary cataract, left eye: Secondary | ICD-10-CM | POA: Diagnosis not present

## 2020-01-27 ENCOUNTER — Other Ambulatory Visit: Payer: Self-pay

## 2020-01-27 ENCOUNTER — Ambulatory Visit (INDEPENDENT_AMBULATORY_CARE_PROVIDER_SITE_OTHER): Payer: PPO | Admitting: Medical

## 2020-01-27 ENCOUNTER — Encounter: Payer: Self-pay | Admitting: Medical

## 2020-01-27 VITALS — BP 120/70 | HR 64 | Resp 18 | Ht 66.0 in | Wt 244.8 lb

## 2020-01-27 DIAGNOSIS — M549 Dorsalgia, unspecified: Secondary | ICD-10-CM

## 2020-01-27 DIAGNOSIS — L089 Local infection of the skin and subcutaneous tissue, unspecified: Secondary | ICD-10-CM | POA: Diagnosis not present

## 2020-01-27 DIAGNOSIS — R350 Frequency of micturition: Secondary | ICD-10-CM | POA: Diagnosis not present

## 2020-01-27 DIAGNOSIS — R109 Unspecified abdominal pain: Secondary | ICD-10-CM

## 2020-01-27 DIAGNOSIS — R921 Mammographic calcification found on diagnostic imaging of breast: Secondary | ICD-10-CM | POA: Diagnosis not present

## 2020-01-27 LAB — POC URINALSYSI DIPSTICK (AUTOMATED)
Bilirubin, UA: NEGATIVE
Blood, UA: NEGATIVE
Glucose, UA: NEGATIVE
Ketones, UA: NEGATIVE
Nitrite, UA: NEGATIVE
Protein, UA: NEGATIVE
Spec Grav, UA: 1.015 (ref 1.010–1.025)
Urobilinogen, UA: 0.2 E.U./dL
pH, UA: 6 (ref 5.0–8.0)

## 2020-01-27 MED ORDER — MUPIROCIN 2 % EX OINT: TOPICAL_OINTMENT | CUTANEOUS | 0 refills | Status: DC

## 2020-01-27 MED ORDER — AMOXICILLIN-POT CLAVULANATE 875-125 MG PO TABS: 1.0000 | ORAL_TABLET | Freq: Two times a day (BID) | ORAL | 0 refills | Status: DC

## 2020-01-27 NOTE — Progress Notes (Signed)
Subjective:    Patient ID: Cassandra Rios, female    DOB: 1951-04-29, 69 y.o.   MRN: [id number]  HPI  Pt one week ago got raised area on rt upper breast that looked like small blister initially. Then past Thursday she popped area and saw creamy yellow discharge. Pt speculates may insect bit her in that area. But reports no itching. Also no fever, no chills or sweats.   Pt has hx of benign breast calcifications.   IMPRESSION: Stable probably benign right breast calcifications.  RECOMMENDATION: Bilateral diagnostic mammography with right breast magnification views in 12 months.  I have discussed the findings and recommendations with the patient. If applicable, a reminder letter will be sent to the patient regarding the next appointment.  BI-RADS CATEGORY  3: Probably benign.  Also recent mild rt cva area pain with some mild frequent urination recently. Pt has hx of uti years ago but not any urinary infection symptoms recently. No skin rash. No burning on urination. She thinks urine might have slight odor.  On review pt thinks has side effect to bactrim in the past.    Review of Systems  Constitutional: Negative for chills, fatigue and fever.  Respiratory: Negative for chest tightness, shortness of breath and wheezing.   Cardiovascular: Negative for chest pain and palpitations.  Gastrointestinal: Negative for abdominal pain, blood in stool and diarrhea.  Genitourinary: Positive for frequency. Negative for difficulty urinating, dysuria and urgency.       Slight urine odor.  Musculoskeletal:       Rt cva area pain.  Skin: Negative for rash.       See hpi and physcial exam.  Psychiatric/Behavioral: Negative for behavioral problems.    Past Medical History:  Diagnosis Date  . AAA (abdominal aortic aneurysm) (HCC)   . Allergic rhinitis   . Chronic hoarseness   . Colon polyps   . COPD (chronic obstructive pulmonary disease) (HCC)    Dr. Marchelle Gearing  . Dyslipidemia     . Hypertension   . Hypothyroidism   . MS (multiple sclerosis) (HCC)   . Obesity   . Stomach ulcer from aspirin/ibuprofen-like drugs (NSAID's)      Social History   Socioeconomic History  . Marital status: Married    Spouse name: Casimiro Needle  . Number of children: 2  . Years of education: 63  . Highest education level: Not on file  Occupational History  . Occupation: retired  Tobacco Use  . Smoking status: Former Smoker    Packs/day: 2.00    Years: 40.00    Pack years: 80.00    Types: Cigarettes    Quit date: 04/05/2007    Years since quitting: 12.8  . Smokeless tobacco: Never Used  Vaping Use  . Vaping Use: Never assessed  Substance and Sexual Activity  . Alcohol use: No  . Drug use: No  . Sexual activity: Never    Partners: Male  Other Topics Concern  . Not on file  Social History Narrative   Patient is right handed,reside in home with husband   Social Determinants of Health   Financial Resource Strain:   . Difficulty of Paying Living Expenses: Not on file  Food Insecurity:   . Worried About Programme researcher, broadcasting/film/video in the Last Year: Not on file  . Ran Out of Food in the Last Year: Not on file  Transportation Needs:   . Lack of Transportation (Medical): Not on file  . Lack of Transportation (Non-Medical): Not  on file  Physical Activity:   . Days of Exercise per Week: Not on file  . Minutes of Exercise per Session: Not on file  Stress:   . Feeling of Stress : Not on file  Social Connections:   . Frequency of Communication with Friends and Family: Not on file  . Frequency of Social Gatherings with Friends and Family: Not on file  . Attends Religious Services: Not on file  . Active Member of Clubs or Organizations: Not on file  . Attends Banker Meetings: Not on file  . Marital Status: Not on file  Intimate Partner Violence:   . Fear of Current or Ex-Partner: Not on file  . Emotionally Abused: Not on file  . Physically Abused: Not on file  .  Sexually Abused: Not on file    Past Surgical History:  Procedure Laterality Date  . ABDOMINAL HYSTERECTOMY    . BARIATRIC SURGERY  09/07/08   lap band  . CARPAL TUNNEL RELEASE    . CATARACT EXTRACTION Right 11/08/2017  . CATARACT EXTRACTION Left 11/22/2017  . CATARACT EXTRACTION, BILATERAL      Toric lenses for astigmatism  . CHOLECYSTECTOMY    . CHOLECYSTECTOMY    . HIATAL HERNIA REPAIR  09/07/08  . LEFT HEART CATH AND CORONARY ANGIOGRAPHY N/A 07/09/2019   Procedure: LEFT HEART CATH AND CORONARY ANGIOGRAPHY;  Surgeon: Lyn Records, MD;  Location: [facility];  Service: Cardiovascular;  Laterality: N/A;    Family History  Problem Relation Age of Onset  . COPD Father   . Heart disease Father   . AAA (abdominal aortic aneurysm) Father   . COPD Sister   . Arthritis Sister   . AAA (abdominal aortic aneurysm) Sister   . Dementia Sister   . Other Sister        brain injury  . Stroke Mother 5  . Heart disease Mother        congenital heart defect?    Allergies  Allergen Reactions  . Codeine Itching and Nausea And Vomiting  . Hydromorphone Hcl Nausea And Vomiting  . Ibuprofen Other (See Comments)    stomach ulcers  . Morphine Sulfate     "makes my blood pressure bottom out"  . Metronidazole Hives, Nausea And Vomiting and Rash    Current Outpatient Medications on File Prior to Visit  Medication Sig Dispense Refill  . acetaminophen (TYLENOL) 500 MG tablet Take 1,000 mg by mouth every 6 (six) hours as needed for moderate pain or headache.    Marland Kitchen aspirin EC 81 MG tablet Take 1 tablet (81 mg total) by mouth daily. 90 tablet 3  . atorvastatin (LIPITOR) 80 MG tablet Take 1 tablet (80 mg total) by mouth daily. 90 tablet 3  . calcium carbonate (TUMS - DOSED IN MG ELEMENTAL CALCIUM) 500 MG chewable tablet Chew 2 tablets by mouth daily as needed for indigestion or heartburn.    . diphenhydramine-acetaminophen (TYLENOL PM) 25-500 MG TABS tablet Take 0.5-1 tablets by mouth at  bedtime as needed (sleep).    . fluticasone (FLONASE) 50 MCG/ACT nasal spray Place 2 sprays into both nostrils daily. (Patient not taking: Reported on 01/27/2020) 16 g 6  . gabapentin (NEURONTIN) 300 MG capsule Take 600 mg by mouth every evening.     Marland Kitchen HYDROcodone-acetaminophen (NORCO/VICODIN) 5-325 MG per tablet Take 0.5 tablets by mouth 2 (two) times daily as needed for moderate pain.     . Interferon Beta-1b (BETASERON/EXTAVIA) 0.3 MG KIT injection  Inject 0.25 mg into the skin every other day.    . levocetirizine (XYZAL) 5 MG tablet Take 1 tablet (5 mg total) by mouth every evening. 30 tablet 5  . levothyroxine (SYNTHROID) 112 MCG tablet TAKE 1 TABLET (112 MCG TOTAL) BY MOUTH DAILY BEFORE BREAKFAST. 90 tablet 0  . mometasone (NASONEX) 50 MCG/ACT nasal spray Place 2 sprays into the nose as needed.    . Omega-3 Fatty Acids (FISH OIL) 1000 MG CAPS Take 1 capsule by mouth daily. (Patient not taking: Reported on 01/27/2020)    . Polyethyl Glycol-Propyl Glycol (SYSTANE ULTRA OP) Place 1 drop into the right eye daily as needed (dry eye).     Marland Kitchen sertraline (ZOLOFT) 50 MG tablet Take 1 tablet (50 mg total) by mouth daily. 90 tablet 1  . telmisartan-hydrochlorothiazide (MICARDIS HCT) 40-12.5 MG tablet Take 0.5 tablets by mouth daily. 45 tablet 3  . traMADol (ULTRAM) 50 MG tablet Take 1 tablet (50 mg total) by mouth every 6 (six) hours as needed. 30 tablet 0  . triamcinolone ointment (KENALOG) 0.1 % Apply 1 application topically every other day.   5   No current facility-administered medications on file prior to visit.    BP (!) 150/79 (BP Location: Left Arm, Patient Position: Sitting, Cuff Size: Large)   Pulse 64   Resp 18   Ht 5\' 6"  (1.676 m)   Wt 244 lb 12.8 oz (111 kg)   SpO2 99%   BMI 39.51 kg/m       Objective:   Physical Exam  General- No acute distress. Pleasant patient. Neck- Full range of motion, no jvd Lungs- Clear, even and unlabored. Heart- regular rate and rhythm. Neurologic-  CNII- XII grossly intact. Back- faint rt cva area pain. Mild bilateral si area pain.  Breast- rt breast upper side has 10 mm approximate scab present. No dc. Area underneath feel mild indurated. surronding skin not red. Rt axillary area shows no lymphadenopathy. Breast symetric. No nipple dc.      Assessment & Plan:  For probable skin infection right breast will rx augmentin antibiotic and rx topical mupirocin.   We need to recheck area in 7 days. This scab area happens to be in region of rt breast upper quadrant calcifications so unless area feels softer will likely proceed with caution and get imaging studies possible [name] and or mammography.  For cva pain with leukocytes in urine will get urine culture and follow study. Augmentin can have coverage for uti as well.  Follow up in 7 days or as needed.(very important to keep follow up)  Esperanza Richters, PA-C   Time spent with patient today was 40 minutes which consisted of chart review, discussing diagnosie, work up, treatment and documentation.

## 2020-01-27 NOTE — Patient Instructions (Signed)
For probable skin infection right breast will rx augmentin antibiotic and rx topical mupirocin.   We need to recheck area in 7 days. This scab area happens to be in region of rt breast upper quadrant calcifications so unless area feels softer will likely proceed with caution and get imaging studies possible [name] and or mammography.  For cva pain with leukocytes in urine will get urine culture and follow study. Augmentin can have coverage for uti as well.  Follow up in 7 days or as needed.(very important to keep follow up)

## 2020-01-28 ENCOUNTER — Telehealth: Payer: PPO

## 2020-01-28 ENCOUNTER — Telehealth: Payer: Self-pay | Admitting: Pharmacist

## 2020-01-28 NOTE — Progress Notes (Addendum)
  Chronic Care Management Pharmacy Assistant   Name: Cassandra Rios  MRN: [id number] DOB: 01/12/1951  Reason for Encounter: Disease State  Patient Questions:  1.  Have you seen any other providers since your last visit? Yes  2.  Any changes in your medicines or health? Yes   PCP : Lowne Chase, Yvonne R, DO   Their chronic conditions include: COPD, HTN, HLD, CAD, Hypothyroidism, Depression, Allergic Rhinitis, Multiple Sclerosis, Back Pain, Lichen Sclerosis, Insomnia  Office Visits: 10-21-2019 (PCP) Patient presented in the office for annual physical exam. C/o problems with  pnd and allergies. Patient was found to have a viral upper respiratory tract infection and was prescribed Xyzal 5mg tab; one tab in the evening and Fluticasone 50 mcg/act nasal spray; two sprays in both nostrils daily.  Consults: 09-10-2019 (Cardiologist) Patient presented in the office with Dr.Smith for eval of CAD. The notes indicate patient's blood pressure was running low, and her telmisartan 40/12.5mg tab was decreased to one half tab   10-17-2019 (Dermatology) Lichen sclerosus of the vulva. Patient was prescribed Gabapentin 300mg caps; two caps in the evening and Triamcinolone ointment BID until burning sensation is better, then use M, W, F.  11-17-2019 (Neurology) Patient presented in the office for a follow-up for MS. MRI was ordered. No medication changes.  01-27-2020 Fam Med) Patient presented in the office with Dr. Saguier to evaluate probable skin infection of the right breast. She was prescribed Augmentin and topical mupirocin. Was advised to follow up in seven days.   Allergies:   Allergies  Allergen Reactions  . Codeine Itching and Nausea And Vomiting  . Hydromorphone Hcl Nausea And Vomiting  . Ibuprofen Other (See Comments)    stomach ulcers  . Morphine Sulfate     "makes my blood pressure bottom out"  . Metronidazole Hives, Nausea And Vomiting and Rash    Medications: Outpatient Encounter  Medications as of 01/28/2020  Medication Sig  . acetaminophen (TYLENOL) 500 MG tablet Take 1,000 mg by mouth every 6 (six) hours as needed for moderate pain or headache.  . amoxicillin-clavulanate (AUGMENTIN) 875-125 MG tablet Take 1 tablet by mouth 2 (two) times daily.  . aspirin EC 81 MG tablet Take 1 tablet (81 mg total) by mouth daily.  . atorvastatin (LIPITOR) 80 MG tablet Take 1 tablet (80 mg total) by mouth daily.  . calcium carbonate (TUMS - DOSED IN MG ELEMENTAL CALCIUM) 500 MG chewable tablet Chew 2 tablets by mouth daily as needed for indigestion or heartburn.  . diphenhydramine-acetaminophen (TYLENOL PM) 25-500 MG TABS tablet Take 0.5-1 tablets by mouth at bedtime as needed (sleep).  . fluticasone (FLONASE) 50 MCG/ACT nasal spray Place 2 sprays into both nostrils daily. (Patient not taking: Reported on 01/27/2020)  . gabapentin (NEURONTIN) 300 MG capsule Take 600 mg by mouth every evening.   . HYDROcodone-acetaminophen (NORCO/VICODIN) 5-325 MG per tablet Take 0.5 tablets by mouth 2 (two) times daily as needed for moderate pain.   . Interferon Beta-1b (BETASERON/EXTAVIA) 0.3 MG KIT injection Inject 0.25 mg into the skin every other day.  . levocetirizine (XYZAL) 5 MG tablet Take 1 tablet (5 mg total) by mouth every evening.  . levothyroxine (SYNTHROID) 112 MCG tablet TAKE 1 TABLET (112 MCG TOTAL) BY MOUTH DAILY BEFORE BREAKFAST.  . mometasone (NASONEX) 50 MCG/ACT nasal spray Place 2 sprays into the nose as needed.  . mupirocin ointment (BACTROBAN) 2 % Apply to area twice daily  . Omega-3 Fatty Acids (FISH OIL) 1000 MG   Chronic Care Management Pharmacy Assistant   Name: Cassandra Rios  MRN: [id number] DOB: 02-May-1951  Reason for Encounter: Disease State  Patient Questions:  1.  Have you seen any other providers since your last visit? Yes  2.  Any changes in your medicines or health? Yes   PCP : Ann Held, DO   Their chronic conditions include: COPD, HTN, HLD, CAD, Hypothyroidism, Depression, Allergic Rhinitis, Multiple Sclerosis, Back Pain, Lichen Sclerosis, Insomnia  Office Visits: 10-21-2019 (PCP) Patient presented in the office for annual physical exam. C/o problems with  pnd and allergies. Patient was found to have a viral upper respiratory tract infection and was prescribed Xyzal 26m tab; one tab in the evening and Fluticasone 50 mcg/act nasal spray; two sprays in both nostrils daily.  Consults: 09-10-2019 (Cardiologist) Patient presented in the office with Dr.Smith for eval of CAD. The notes indicate patient's blood pressure was running low, and her telmisartan 40/12.573mtab was decreased to one half tab   10-07-07-7353Dermatology) Lichen sclerosus of the vulva. Patient was prescribed Gabapentin 30059maps; two caps in the evening and Triamcinolone ointment BID until burning sensation is better, then use M, W, F.  11-17-2019 (Neurology) Patient presented in the office for a follow-up for MS. MRI was ordered. No medication changes.  01-27-2020 Fam Med) Patient presented in the office with Dr. SagHarvie Heck evaluate probable skin infection of the right breast. She was prescribed Augmentin and topical mupirocin. Was advised to follow up in seven days.   Allergies:   Allergies  Allergen Reactions  . Codeine Itching and Nausea And Vomiting  . Hydromorphone Hcl Nausea And Vomiting  . Ibuprofen Other (See Comments)    stomach ulcers  . Morphine Sulfate     "makes my blood pressure bottom out"  . Metronidazole Hives, Nausea And Vomiting and Rash    Medications: Outpatient Encounter  Medications as of 01/28/2020  Medication Sig  . acetaminophen (TYLENOL) 500 MG tablet Take 1,000 mg by mouth every 6 (six) hours as needed for moderate pain or headache.  . aMarland Kitchenoxicillin-clavulanate (AUGMENTIN) 875-125 MG tablet Take 1 tablet by mouth 2 (two) times daily.  . aMarland Kitchenpirin EC 81 MG tablet Take 1 tablet (81 mg total) by mouth daily.  . aMarland Kitchenorvastatin (LIPITOR) 80 MG tablet Take 1 tablet (80 mg total) by mouth daily.  . calcium carbonate (TUMS - DOSED IN MG ELEMENTAL CALCIUM) 500 MG chewable tablet Chew 2 tablets by mouth daily as needed for indigestion or heartburn.  . diphenhydramine-acetaminophen (TYLENOL PM) 25-500 MG TABS tablet Take 0.5-1 tablets by mouth at bedtime as needed (sleep).  . fluticasone (FLONASE) 50 MCG/ACT nasal spray Place 2 sprays into both nostrils daily. (Patient not taking: Reported on 01/27/2020)  . gabapentin (NEURONTIN) 300 MG capsule Take 600 mg by mouth every evening.   . HMarland KitchenDROcodone-acetaminophen (NORCO/VICODIN) 5-325 MG per tablet Take 0.5 tablets by mouth 2 (two) times daily as needed for moderate pain.   . Interferon Beta-1b (BETASERON/EXTAVIA) 0.3 MG KIT injection Inject 0.25 mg into the skin every other day.  . levocetirizine (XYZAL) 5 MG tablet Take 1 tablet (5 mg total) by mouth every evening.  . lMarland Kitchenvothyroxine (SYNTHROID) 112 MCG tablet TAKE 1 TABLET (112 MCG TOTAL) BY MOUTH DAILY BEFORE BREAKFAST.  . mometasone (NASONEX) 50 MCG/ACT nasal spray Place 2 sprays into the nose as needed.  . mupirocin ointment (BACTROBAN) 2 % Apply to area twice daily  . Omega-3 Fatty Acids (FISH OIL) 1000 MG    Chronic Care Management Pharmacy Assistant   Name: Cassandra Rios  MRN: [id number] DOB: 10/27/1950  Reason for Encounter: Disease State  Patient Questions:  1.  Have you seen any other providers since your last visit? Yes  2.  Any changes in your medicines or health? Yes   PCP : Lowne Chase, Yvonne R, DO   Their chronic conditions include: COPD, HTN, HLD, CAD, Hypothyroidism, Depression, Allergic Rhinitis, Multiple Sclerosis, Back Pain, Lichen Sclerosis, Insomnia  Office Visits: 10-21-2019 (PCP) Patient presented in the office for annual physical exam. C/o problems with  pnd and allergies. Patient was found to have a viral upper respiratory tract infection and was prescribed Xyzal 5mg tab; one tab in the evening and Fluticasone 50 mcg/act nasal spray; two sprays in both nostrils daily.  Consults: 09-10-2019 (Cardiologist) Patient presented in the office with Dr.Smith for eval of CAD. The notes indicate patient's blood pressure was running low, and her telmisartan 40/12.5mg tab was decreased to one half tab   10-17-2019 (Dermatology) Lichen sclerosus of the vulva. Patient was prescribed Gabapentin 300mg caps; two caps in the evening and Triamcinolone ointment BID until burning sensation is better, then use M, W, F.  11-17-2019 (Neurology) Patient presented in the office for a follow-up for MS. MRI was ordered. No medication changes.  01-27-2020 Fam Med) Patient presented in the office with Dr. Saguier to evaluate probable skin infection of the right breast. She was prescribed Augmentin and topical mupirocin. Was advised to follow up in seven days.   Allergies:   Allergies  Allergen Reactions  . Codeine Itching and Nausea And Vomiting  . Hydromorphone Hcl Nausea And Vomiting  . Ibuprofen Other (See Comments)    stomach ulcers  . Morphine Sulfate     "makes my blood pressure bottom out"  . Metronidazole Hives, Nausea And Vomiting and Rash    Medications: Outpatient Encounter  Medications as of 01/28/2020  Medication Sig  . acetaminophen (TYLENOL) 500 MG tablet Take 1,000 mg by mouth every 6 (six) hours as needed for moderate pain or headache.  . amoxicillin-clavulanate (AUGMENTIN) 875-125 MG tablet Take 1 tablet by mouth 2 (two) times daily.  . aspirin EC 81 MG tablet Take 1 tablet (81 mg total) by mouth daily.  . atorvastatin (LIPITOR) 80 MG tablet Take 1 tablet (80 mg total) by mouth daily.  . calcium carbonate (TUMS - DOSED IN MG ELEMENTAL CALCIUM) 500 MG chewable tablet Chew 2 tablets by mouth daily as needed for indigestion or heartburn.  . diphenhydramine-acetaminophen (TYLENOL PM) 25-500 MG TABS tablet Take 0.5-1 tablets by mouth at bedtime as needed (sleep).  . fluticasone (FLONASE) 50 MCG/ACT nasal spray Place 2 sprays into both nostrils daily. (Patient not taking: Reported on 01/27/2020)  . gabapentin (NEURONTIN) 300 MG capsule Take 600 mg by mouth every evening.   . HYDROcodone-acetaminophen (NORCO/VICODIN) 5-325 MG per tablet Take 0.5 tablets by mouth 2 (two) times daily as needed for moderate pain.   . Interferon Beta-1b (BETASERON/EXTAVIA) 0.3 MG KIT injection Inject 0.25 mg into the skin every other day.  . levocetirizine (XYZAL) 5 MG tablet Take 1 tablet (5 mg total) by mouth every evening.  . levothyroxine (SYNTHROID) 112 MCG tablet TAKE 1 TABLET (112 MCG TOTAL) BY MOUTH DAILY BEFORE BREAKFAST.  . mometasone (NASONEX) 50 MCG/ACT nasal spray Place 2 sprays into the nose as needed.  . mupirocin ointment (BACTROBAN) 2 % Apply to area twice daily  . Omega-3 Fatty Acids (FISH OIL) 1000 MG

## 2020-01-30 LAB — URINE CULTURE
MICRO NUMBER:: 10864643
SPECIMEN QUALITY:: ADEQUATE

## 2020-02-01 ENCOUNTER — Other Ambulatory Visit: Payer: Self-pay | Admitting: Family Medicine

## 2020-02-03 ENCOUNTER — Other Ambulatory Visit: Payer: Self-pay

## 2020-02-03 ENCOUNTER — Encounter: Payer: Self-pay | Admitting: Family Medicine

## 2020-02-03 ENCOUNTER — Ambulatory Visit (INDEPENDENT_AMBULATORY_CARE_PROVIDER_SITE_OTHER): Payer: PPO | Admitting: Family Medicine

## 2020-02-03 VITALS — BP 100/60 | HR 73 | Temp 98.3°F | Resp 18 | Ht 66.0 in | Wt 244.2 lb

## 2020-02-03 DIAGNOSIS — R109 Unspecified abdominal pain: Secondary | ICD-10-CM | POA: Diagnosis not present

## 2020-02-03 DIAGNOSIS — N61 Mastitis without abscess: Secondary | ICD-10-CM | POA: Insufficient documentation

## 2020-02-03 LAB — POC URINALSYSI DIPSTICK (AUTOMATED)
Bilirubin, UA: NEGATIVE
Blood, UA: NEGATIVE
Glucose, UA: NEGATIVE
Ketones, UA: NEGATIVE
Leukocytes, UA: NEGATIVE
Nitrite, UA: NEGATIVE
Protein, UA: NEGATIVE
Spec Grav, UA: 1.015 (ref 1.010–1.025)
Urobilinogen, UA: 0.2 E.U./dL
pH, UA: 6 (ref 5.0–8.0)

## 2020-02-03 NOTE — Patient Instructions (Signed)

## 2020-02-03 NOTE — Assessment & Plan Note (Signed)
resolved 

## 2020-02-03 NOTE — Assessment & Plan Note (Signed)
Check ua today  She has f/u with ortho

## 2020-02-03 NOTE — Progress Notes (Signed)
Patient ID: Cassandra Rios, female    DOB: 01/11/1951  Age: 69 y.o. MRN: [id number]    Subjective:  Subjective  HPI Cassandra Rios presents for f/u urine and cellulitis r breast ----- cellulitis is better  She still has low back pain and will make appointment with ortho to f/u but also is wanting her urine rechecked  Review of Systems  Constitutional: Negative for appetite change, diaphoresis, fatigue and unexpected weight change.  Eyes: Negative for pain, redness and visual disturbance.  Respiratory: Negative for cough, chest tightness, shortness of breath and wheezing.   Cardiovascular: Negative for chest pain, palpitations and leg swelling.  Endocrine: Negative for cold intolerance, heat intolerance, polydipsia, polyphagia and polyuria.  Genitourinary: Negative for difficulty urinating, dysuria and frequency.  Musculoskeletal: Positive for back pain.  Skin: Negative for color change and wound.  Neurological: Negative for dizziness, light-headedness, numbness and headaches.    History Past Medical History:  Diagnosis Date  . AAA (abdominal aortic aneurysm) ([place])   . Allergic rhinitis   . Chronic hoarseness   . Colon polyps   . COPD (chronic obstructive pulmonary disease) ([facility])    Dr. Chase Caller  . Dyslipidemia   . Hypertension   . Hypothyroidism   . MS (multiple sclerosis) ([place])   . Obesity   . Stomach ulcer from aspirin/ibuprofen-like drugs (NSAID's)     She has a past surgical history that includes Cholecystectomy; Carpal tunnel release; Bariatric Surgery (09/07/08); Hiatal hernia repair (09/07/08); Abdominal hysterectomy; Cholecystectomy; Cataract extraction, bilateral; Cataract extraction (Right, 11/08/2017); Cataract extraction (Left, 11/22/2017); and LEFT HEART CATH AND CORONARY ANGIOGRAPHY (N/A, 07/09/2019).   Her family history includes AAA (abdominal aortic aneurysm) in her father and sister; Arthritis in her sister; COPD in her father and sister; Dementia in her  sister; Heart disease in her father and mother; Other in her sister; Stroke (age of onset: 24) in her mother.She reports that she quit smoking about 12 years ago. Her smoking use included cigarettes. She has a 80.00 pack-year smoking history. She has never used smokeless tobacco. She reports that she does not drink alcohol and does not use drugs.  Current Outpatient Medications on File Prior to Visit  Medication Sig Dispense Refill  . acetaminophen (TYLENOL) 500 MG tablet Take 1,000 mg by mouth every 6 (six) hours as needed for moderate pain or headache.    Marland Kitchen amoxicillin-clavulanate (AUGMENTIN) 875-125 MG tablet Take 1 tablet by mouth 2 (two) times daily. 20 tablet 0  . aspirin EC 81 MG tablet Take 1 tablet (81 mg total) by mouth daily. 90 tablet 3  . calcium carbonate (TUMS - DOSED IN MG ELEMENTAL CALCIUM) 500 MG chewable tablet Chew 2 tablets by mouth daily as needed for indigestion or heartburn.    . diphenhydramine-acetaminophen (TYLENOL PM) 25-500 MG TABS tablet Take 0.5-1 tablets by mouth at bedtime as needed (sleep).    . gabapentin (NEURONTIN) 300 MG capsule Take 600 mg by mouth every evening.     Marland Kitchen HYDROcodone-acetaminophen (NORCO/VICODIN) 5-325 MG per tablet Take 0.5 tablets by mouth 2 (two) times daily as needed for moderate pain.     . Interferon Beta-1b (BETASERON/EXTAVIA) 0.3 MG KIT injection Inject 0.25 mg into the skin every other day.    . levocetirizine (XYZAL) 5 MG tablet Take 1 tablet (5 mg total) by mouth every evening. 30 tablet 5  . levothyroxine (SYNTHROID) 112 MCG tablet TAKE 1 TABLET (112 MCG TOTAL) BY MOUTH DAILY BEFORE BREAKFAST. 90 tablet 0  .  Patient ID: Cassandra Rios, female    DOB: 01/11/1951  Age: 69 y.o. MRN: [id number]    Subjective:  Subjective  HPI Cassandra Rios presents for f/u urine and cellulitis r breast ----- cellulitis is better  She still has low back pain and will make appointment with ortho to f/u but also is wanting her urine rechecked  Review of Systems  Constitutional: Negative for appetite change, diaphoresis, fatigue and unexpected weight change.  Eyes: Negative for pain, redness and visual disturbance.  Respiratory: Negative for cough, chest tightness, shortness of breath and wheezing.   Cardiovascular: Negative for chest pain, palpitations and leg swelling.  Endocrine: Negative for cold intolerance, heat intolerance, polydipsia, polyphagia and polyuria.  Genitourinary: Negative for difficulty urinating, dysuria and frequency.  Musculoskeletal: Positive for back pain.  Skin: Negative for color change and wound.  Neurological: Negative for dizziness, light-headedness, numbness and headaches.    History Past Medical History:  Diagnosis Date  . AAA (abdominal aortic aneurysm) ([place])   . Allergic rhinitis   . Chronic hoarseness   . Colon polyps   . COPD (chronic obstructive pulmonary disease) ([facility])    Dr. Chase Caller  . Dyslipidemia   . Hypertension   . Hypothyroidism   . MS (multiple sclerosis) ([place])   . Obesity   . Stomach ulcer from aspirin/ibuprofen-like drugs (NSAID's)     She has a past surgical history that includes Cholecystectomy; Carpal tunnel release; Bariatric Surgery (09/07/08); Hiatal hernia repair (09/07/08); Abdominal hysterectomy; Cholecystectomy; Cataract extraction, bilateral; Cataract extraction (Right, 11/08/2017); Cataract extraction (Left, 11/22/2017); and LEFT HEART CATH AND CORONARY ANGIOGRAPHY (N/A, 07/09/2019).   Her family history includes AAA (abdominal aortic aneurysm) in her father and sister; Arthritis in her sister; COPD in her father and sister; Dementia in her  sister; Heart disease in her father and mother; Other in her sister; Stroke (age of onset: 24) in her mother.She reports that she quit smoking about 12 years ago. Her smoking use included cigarettes. She has a 80.00 pack-year smoking history. She has never used smokeless tobacco. She reports that she does not drink alcohol and does not use drugs.  Current Outpatient Medications on File Prior to Visit  Medication Sig Dispense Refill  . acetaminophen (TYLENOL) 500 MG tablet Take 1,000 mg by mouth every 6 (six) hours as needed for moderate pain or headache.    Marland Kitchen amoxicillin-clavulanate (AUGMENTIN) 875-125 MG tablet Take 1 tablet by mouth 2 (two) times daily. 20 tablet 0  . aspirin EC 81 MG tablet Take 1 tablet (81 mg total) by mouth daily. 90 tablet 3  . calcium carbonate (TUMS - DOSED IN MG ELEMENTAL CALCIUM) 500 MG chewable tablet Chew 2 tablets by mouth daily as needed for indigestion or heartburn.    . diphenhydramine-acetaminophen (TYLENOL PM) 25-500 MG TABS tablet Take 0.5-1 tablets by mouth at bedtime as needed (sleep).    . gabapentin (NEURONTIN) 300 MG capsule Take 600 mg by mouth every evening.     Marland Kitchen HYDROcodone-acetaminophen (NORCO/VICODIN) 5-325 MG per tablet Take 0.5 tablets by mouth 2 (two) times daily as needed for moderate pain.     . Interferon Beta-1b (BETASERON/EXTAVIA) 0.3 MG KIT injection Inject 0.25 mg into the skin every other day.    . levocetirizine (XYZAL) 5 MG tablet Take 1 tablet (5 mg total) by mouth every evening. 30 tablet 5  . levothyroxine (SYNTHROID) 112 MCG tablet TAKE 1 TABLET (112 MCG TOTAL) BY MOUTH DAILY BEFORE BREAKFAST. 90 tablet 0  .  [facility] [address] [place], [place] [address] [phone] NEUROIMAGING REPORT STUDY DATE: 11/27/2019 PATIENT NAME: Cassandra Rios DOB: 21-May-1951 MRN: [id number] EXAM: MRI Brain with and without contrast ORDERING CLINICIAN: Ward Givens, NP CLINICAL HISTORY: 69 year old woman with multiple sclerosis COMPARISON FILMS: 09/21/2017 TECHNIQUE:MRI of the brain with and without contrast was obtained utilizing 5 mm axial slices with T1, T2, T2 flair, SWI and diffusion weighted views.  T1 sagittal, T2 coronal images were also e obtained.  After the infusion of contrast, sagittal T1-weighted images were obtained. CONTRAST: 20 ml [employer] IMAGING SITE: [facility], [place]. FINDINGS: On sagittal images, the spinal cord is imaged caudally to C4 and is normal in caliber.   The contents of the posterior fossa are of normal size and position.   The pituitary gland is thinned within a normal sized sella turcica, unchanged compared to the 2019 MRI.  The optic chiasm appear normal.    Brain volume appears normal.   The ventricles are normal in size and without distortion.  There are no abnormal extra-axial collections of fluid.  In the hemispheres, there are multiple T2/FLAIR hyperintense foci, many in the periventricular white matter radially oriented to the ventricles.  Some foci are also located in the juxtacortical  and deep white matter.  None of the foci appear to be acute and they do not enhance.  Compared to the MRI dated 09/21/2017, there are no new lesions.  The cerebellum and brainstem appears normal.   The deep gray matter appears normal.  The cerebral hemispheres appear norma.   Diffusion weighted images are normal.  Susceptibility weighted images are normal.   There have been bilateral lens replacements.  Otherwise, the orbits appear normal.   The VIIth/VIIIth nerve complex appears normal.  The mastoid air cells appear normal.  The paranasal sinuses appear normal.  Flow voids are identified within the major intracerebral arteries.  After the infusion of contrast material, a normal enhancement pattern is noted.   This MRI of the brain with and without contrast shows the following: 1.   Multiple T2/FLAIR hyperintense foci in the hemispheres in a pattern and configuration consistent with chronic demyelinating plaque associated with multiple sclerosis.  Some foci could also be due to chronic microvascular ischemic changes.  None of the lesions appear to be acute and they do not enhance.  Compared to the MRI dated 09/21/2017, there is no definite interval change. 2.   The pituitary gland is thinned within a normal sized sella turcica.  This is usually an incidental finding but could also be due to elevated intracranial pressures. 3.   There are no acute findings and there is a normal enhancement pattern. INTERPRETING PHYSICIAN: Richard A. Felecia Shelling, MD, PhD, FAAN Certified in  Neuroimaging by [employer]     Assessment & Plan:  Plan  I have discontinued Mannie Wineland. Dohner's Fish Oil and fluticasone. I am also having her maintain her HYDROcodone-acetaminophen, triamcinolone ointment, gabapentin, Polyethyl Glycol-Propyl Glycol (SYSTANE ULTRA OP), aspirin EC, acetaminophen, diphenhydramine-acetaminophen, calcium carbonate, atorvastatin, Interferon Beta-1b, mometasone, telmisartan-hydrochlorothiazide,  traMADol, levocetirizine, sertraline, amoxicillin-clavulanate, mupirocin ointment, and levothyroxine.  No orders of the defined types were placed in this encounter.   Problem List Items Addressed This Visit      Unprioritized   Cellulitis of breast    resolved      Flank pain - Primary    Check ua today  She has f/u with ortho  Patient ID: Cassandra Rios, female    DOB: 01/11/1951  Age: 69 y.o. MRN: [id number]    Subjective:  Subjective  HPI Cassandra Rios presents for f/u urine and cellulitis r breast ----- cellulitis is better  She still has low back pain and will make appointment with ortho to f/u but also is wanting her urine rechecked  Review of Systems  Constitutional: Negative for appetite change, diaphoresis, fatigue and unexpected weight change.  Eyes: Negative for pain, redness and visual disturbance.  Respiratory: Negative for cough, chest tightness, shortness of breath and wheezing.   Cardiovascular: Negative for chest pain, palpitations and leg swelling.  Endocrine: Negative for cold intolerance, heat intolerance, polydipsia, polyphagia and polyuria.  Genitourinary: Negative for difficulty urinating, dysuria and frequency.  Musculoskeletal: Positive for back pain.  Skin: Negative for color change and wound.  Neurological: Negative for dizziness, light-headedness, numbness and headaches.    History Past Medical History:  Diagnosis Date  . AAA (abdominal aortic aneurysm) ([place])   . Allergic rhinitis   . Chronic hoarseness   . Colon polyps   . COPD (chronic obstructive pulmonary disease) ([facility])    Dr. Chase Caller  . Dyslipidemia   . Hypertension   . Hypothyroidism   . MS (multiple sclerosis) ([place])   . Obesity   . Stomach ulcer from aspirin/ibuprofen-like drugs (NSAID's)     She has a past surgical history that includes Cholecystectomy; Carpal tunnel release; Bariatric Surgery (09/07/08); Hiatal hernia repair (09/07/08); Abdominal hysterectomy; Cholecystectomy; Cataract extraction, bilateral; Cataract extraction (Right, 11/08/2017); Cataract extraction (Left, 11/22/2017); and LEFT HEART CATH AND CORONARY ANGIOGRAPHY (N/A, 07/09/2019).   Her family history includes AAA (abdominal aortic aneurysm) in her father and sister; Arthritis in her sister; COPD in her father and sister; Dementia in her  sister; Heart disease in her father and mother; Other in her sister; Stroke (age of onset: 24) in her mother.She reports that she quit smoking about 12 years ago. Her smoking use included cigarettes. She has a 80.00 pack-year smoking history. She has never used smokeless tobacco. She reports that she does not drink alcohol and does not use drugs.  Current Outpatient Medications on File Prior to Visit  Medication Sig Dispense Refill  . acetaminophen (TYLENOL) 500 MG tablet Take 1,000 mg by mouth every 6 (six) hours as needed for moderate pain or headache.    Marland Kitchen amoxicillin-clavulanate (AUGMENTIN) 875-125 MG tablet Take 1 tablet by mouth 2 (two) times daily. 20 tablet 0  . aspirin EC 81 MG tablet Take 1 tablet (81 mg total) by mouth daily. 90 tablet 3  . calcium carbonate (TUMS - DOSED IN MG ELEMENTAL CALCIUM) 500 MG chewable tablet Chew 2 tablets by mouth daily as needed for indigestion or heartburn.    . diphenhydramine-acetaminophen (TYLENOL PM) 25-500 MG TABS tablet Take 0.5-1 tablets by mouth at bedtime as needed (sleep).    . gabapentin (NEURONTIN) 300 MG capsule Take 600 mg by mouth every evening.     Marland Kitchen HYDROcodone-acetaminophen (NORCO/VICODIN) 5-325 MG per tablet Take 0.5 tablets by mouth 2 (two) times daily as needed for moderate pain.     . Interferon Beta-1b (BETASERON/EXTAVIA) 0.3 MG KIT injection Inject 0.25 mg into the skin every other day.    . levocetirizine (XYZAL) 5 MG tablet Take 1 tablet (5 mg total) by mouth every evening. 30 tablet 5  . levothyroxine (SYNTHROID) 112 MCG tablet TAKE 1 TABLET (112 MCG TOTAL) BY MOUTH DAILY BEFORE BREAKFAST. 90 tablet 0  .

## 2020-02-12 NOTE — Progress Notes (Signed)
Cardiology Office Note:    Date:  02/13/2020   ID:  Cassandra Rios, DOB 1950-07-19, MRN [id number]  PCP:  Ann Held, DO  Cardiologist:  Sinclair Grooms, MD   Referring MD: Carollee Herter, Alferd Apa, *   Chief Complaint  Patient presents with  . Coronary Artery Disease    History of Present Illness:    Cassandra Rios is a 69 y.o. female with a hx of AAA,right bundle branch block,exertional dyspnea,chest discomfort, diaphoresis,three-vessel coronary calcification by CT 2019, coronary angiography after coronary CT demonstrating 70% LAD and circumflex lesions February 2021and near syncope.  2-3 times per week she will have left subclavicular and precordial chest squeezing and spasm-like sensations.  Occurs at rest.  It is not provoked by physical activity.  Episodes last less than 30 seconds.  No significant palpitations associated.  The discomfort has significantly improved since last being seen.  She had catheterization and 2021.  Past Medical History:  Diagnosis Date  . AAA (abdominal aortic aneurysm) ([place])   . Allergic rhinitis   . Chronic hoarseness   . Colon polyps   . COPD (chronic obstructive pulmonary disease) (HCC)    Dr. Chase Caller  . Dyslipidemia   . Hypertension   . Hypothyroidism   . MS (multiple sclerosis) ([place])   . Obesity   . Stomach ulcer from aspirin/ibuprofen-like drugs (NSAID's)     Past Surgical History:  Procedure Laterality Date  . ABDOMINAL HYSTERECTOMY    . BARIATRIC SURGERY  09/07/08   lap band  . CARPAL TUNNEL RELEASE    . CATARACT EXTRACTION Right 11/08/2017  . CATARACT EXTRACTION Left 11/22/2017  . CATARACT EXTRACTION, BILATERAL      Toric lenses for astigmatism  . CHOLECYSTECTOMY    . CHOLECYSTECTOMY    . HIATAL HERNIA REPAIR  09/07/08  . LEFT HEART CATH AND CORONARY ANGIOGRAPHY N/A 07/09/2019   Procedure: LEFT HEART CATH AND CORONARY ANGIOGRAPHY;  Surgeon: Belva Crome, MD;  Location: [facility];  Service:  Cardiovascular;  Laterality: N/A;    Current Medications: Current Meds  Medication Sig  . acetaminophen (TYLENOL) 500 MG tablet Take 1,000 mg by mouth every 6 (six) hours as needed for moderate pain or headache.  Marland Kitchen aspirin EC 81 MG tablet Take 1 tablet (81 mg total) by mouth daily.  . calcium carbonate (TUMS - DOSED IN MG ELEMENTAL CALCIUM) 500 MG chewable tablet Chew 2 tablets by mouth daily as needed for indigestion or heartburn.  . diphenhydramine-acetaminophen (TYLENOL PM) 25-500 MG TABS tablet Take 0.5-1 tablets by mouth at bedtime as needed (sleep).  . gabapentin (NEURONTIN) 300 MG capsule Take 600 mg by mouth every evening.   Marland Kitchen HYDROcodone-acetaminophen (NORCO/VICODIN) 5-325 MG per tablet Take 0.5 tablets by mouth 2 (two) times daily as needed for moderate pain.   . Interferon Beta-1b (BETASERON/EXTAVIA) 0.3 MG KIT injection Inject 0.25 mg into the skin every other day.  . levocetirizine (XYZAL) 5 MG tablet Take 1 tablet (5 mg total) by mouth every evening.  Marland Kitchen levothyroxine (SYNTHROID) 112 MCG tablet TAKE 1 TABLET (112 MCG TOTAL) BY MOUTH DAILY BEFORE BREAKFAST.  . mometasone (NASONEX) 50 MCG/ACT nasal spray Place 2 sprays into the nose as needed.  . mupirocin ointment (BACTROBAN) 2 % Apply to area twice daily  . Polyethyl Glycol-Propyl Glycol (SYSTANE ULTRA OP) Place 1 drop into the right eye daily as needed (dry eye).   Marland Kitchen sertraline (ZOLOFT) 50 MG tablet Take 1 tablet (  Cardiology Office Note:    Date:  02/13/2020   ID:  Cassandra Rios, DOB 1950-07-19, MRN [id number]  PCP:  Ann Held, DO  Cardiologist:  Sinclair Grooms, MD   Referring MD: Carollee Herter, Alferd Apa, *   Chief Complaint  Patient presents with  . Coronary Artery Disease    History of Present Illness:    Cassandra Rios is a 69 y.o. female with a hx of AAA,right bundle branch block,exertional dyspnea,chest discomfort, diaphoresis,three-vessel coronary calcification by CT 2019, coronary angiography after coronary CT demonstrating 70% LAD and circumflex lesions February 2021and near syncope.  2-3 times per week she will have left subclavicular and precordial chest squeezing and spasm-like sensations.  Occurs at rest.  It is not provoked by physical activity.  Episodes last less than 30 seconds.  No significant palpitations associated.  The discomfort has significantly improved since last being seen.  She had catheterization and 2021.  Past Medical History:  Diagnosis Date  . AAA (abdominal aortic aneurysm) ([place])   . Allergic rhinitis   . Chronic hoarseness   . Colon polyps   . COPD (chronic obstructive pulmonary disease) (HCC)    Dr. Chase Caller  . Dyslipidemia   . Hypertension   . Hypothyroidism   . MS (multiple sclerosis) ([place])   . Obesity   . Stomach ulcer from aspirin/ibuprofen-like drugs (NSAID's)     Past Surgical History:  Procedure Laterality Date  . ABDOMINAL HYSTERECTOMY    . BARIATRIC SURGERY  09/07/08   lap band  . CARPAL TUNNEL RELEASE    . CATARACT EXTRACTION Right 11/08/2017  . CATARACT EXTRACTION Left 11/22/2017  . CATARACT EXTRACTION, BILATERAL      Toric lenses for astigmatism  . CHOLECYSTECTOMY    . CHOLECYSTECTOMY    . HIATAL HERNIA REPAIR  09/07/08  . LEFT HEART CATH AND CORONARY ANGIOGRAPHY N/A 07/09/2019   Procedure: LEFT HEART CATH AND CORONARY ANGIOGRAPHY;  Surgeon: Belva Crome, MD;  Location: [facility];  Service:  Cardiovascular;  Laterality: N/A;    Current Medications: Current Meds  Medication Sig  . acetaminophen (TYLENOL) 500 MG tablet Take 1,000 mg by mouth every 6 (six) hours as needed for moderate pain or headache.  Marland Kitchen aspirin EC 81 MG tablet Take 1 tablet (81 mg total) by mouth daily.  . calcium carbonate (TUMS - DOSED IN MG ELEMENTAL CALCIUM) 500 MG chewable tablet Chew 2 tablets by mouth daily as needed for indigestion or heartburn.  . diphenhydramine-acetaminophen (TYLENOL PM) 25-500 MG TABS tablet Take 0.5-1 tablets by mouth at bedtime as needed (sleep).  . gabapentin (NEURONTIN) 300 MG capsule Take 600 mg by mouth every evening.   Marland Kitchen HYDROcodone-acetaminophen (NORCO/VICODIN) 5-325 MG per tablet Take 0.5 tablets by mouth 2 (two) times daily as needed for moderate pain.   . Interferon Beta-1b (BETASERON/EXTAVIA) 0.3 MG KIT injection Inject 0.25 mg into the skin every other day.  . levocetirizine (XYZAL) 5 MG tablet Take 1 tablet (5 mg total) by mouth every evening.  Marland Kitchen levothyroxine (SYNTHROID) 112 MCG tablet TAKE 1 TABLET (112 MCG TOTAL) BY MOUTH DAILY BEFORE BREAKFAST.  . mometasone (NASONEX) 50 MCG/ACT nasal spray Place 2 sprays into the nose as needed.  . mupirocin ointment (BACTROBAN) 2 % Apply to area twice daily  . Polyethyl Glycol-Propyl Glycol (SYSTANE ULTRA OP) Place 1 drop into the right eye daily as needed (dry eye).   Marland Kitchen sertraline (ZOLOFT) 50 MG tablet Take 1 tablet (  Cardiology Office Note:    Date:  02/13/2020   ID:  Cassandra Rios, DOB 1950-07-19, MRN [id number]  PCP:  Ann Held, DO  Cardiologist:  Sinclair Grooms, MD   Referring MD: Carollee Herter, Alferd Apa, *   Chief Complaint  Patient presents with  . Coronary Artery Disease    History of Present Illness:    Cassandra Rios is a 69 y.o. female with a hx of AAA,right bundle branch block,exertional dyspnea,chest discomfort, diaphoresis,three-vessel coronary calcification by CT 2019, coronary angiography after coronary CT demonstrating 70% LAD and circumflex lesions February 2021and near syncope.  2-3 times per week she will have left subclavicular and precordial chest squeezing and spasm-like sensations.  Occurs at rest.  It is not provoked by physical activity.  Episodes last less than 30 seconds.  No significant palpitations associated.  The discomfort has significantly improved since last being seen.  She had catheterization and 2021.  Past Medical History:  Diagnosis Date  . AAA (abdominal aortic aneurysm) ([place])   . Allergic rhinitis   . Chronic hoarseness   . Colon polyps   . COPD (chronic obstructive pulmonary disease) (HCC)    Dr. Chase Caller  . Dyslipidemia   . Hypertension   . Hypothyroidism   . MS (multiple sclerosis) ([place])   . Obesity   . Stomach ulcer from aspirin/ibuprofen-like drugs (NSAID's)     Past Surgical History:  Procedure Laterality Date  . ABDOMINAL HYSTERECTOMY    . BARIATRIC SURGERY  09/07/08   lap band  . CARPAL TUNNEL RELEASE    . CATARACT EXTRACTION Right 11/08/2017  . CATARACT EXTRACTION Left 11/22/2017  . CATARACT EXTRACTION, BILATERAL      Toric lenses for astigmatism  . CHOLECYSTECTOMY    . CHOLECYSTECTOMY    . HIATAL HERNIA REPAIR  09/07/08  . LEFT HEART CATH AND CORONARY ANGIOGRAPHY N/A 07/09/2019   Procedure: LEFT HEART CATH AND CORONARY ANGIOGRAPHY;  Surgeon: Belva Crome, MD;  Location: [facility];  Service:  Cardiovascular;  Laterality: N/A;    Current Medications: Current Meds  Medication Sig  . acetaminophen (TYLENOL) 500 MG tablet Take 1,000 mg by mouth every 6 (six) hours as needed for moderate pain or headache.  Marland Kitchen aspirin EC 81 MG tablet Take 1 tablet (81 mg total) by mouth daily.  . calcium carbonate (TUMS - DOSED IN MG ELEMENTAL CALCIUM) 500 MG chewable tablet Chew 2 tablets by mouth daily as needed for indigestion or heartburn.  . diphenhydramine-acetaminophen (TYLENOL PM) 25-500 MG TABS tablet Take 0.5-1 tablets by mouth at bedtime as needed (sleep).  . gabapentin (NEURONTIN) 300 MG capsule Take 600 mg by mouth every evening.   Marland Kitchen HYDROcodone-acetaminophen (NORCO/VICODIN) 5-325 MG per tablet Take 0.5 tablets by mouth 2 (two) times daily as needed for moderate pain.   . Interferon Beta-1b (BETASERON/EXTAVIA) 0.3 MG KIT injection Inject 0.25 mg into the skin every other day.  . levocetirizine (XYZAL) 5 MG tablet Take 1 tablet (5 mg total) by mouth every evening.  Marland Kitchen levothyroxine (SYNTHROID) 112 MCG tablet TAKE 1 TABLET (112 MCG TOTAL) BY MOUTH DAILY BEFORE BREAKFAST.  . mometasone (NASONEX) 50 MCG/ACT nasal spray Place 2 sprays into the nose as needed.  . mupirocin ointment (BACTROBAN) 2 % Apply to area twice daily  . Polyethyl Glycol-Propyl Glycol (SYSTANE ULTRA OP) Place 1 drop into the right eye daily as needed (dry eye).   Marland Kitchen sertraline (ZOLOFT) 50 MG tablet Take 1 tablet (  we need to change your treatment, we will call you to review the results.   Testing/Procedures: None   Follow-Up: At [facility], you and your health needs are our priority.  As part of our continuing mission to provide you with exceptional heart care, we have created designated Provider Care Teams.  These Care Teams include your primary Cardiologist (physician) and Advanced Practice Providers (APPs -  Physician Assistants and Nurse Practitioners) who all work together to provide you with the care you need, when you need it.  We recommend signing up for the patient portal called "MyChart".  Sign up information is provided on this After Visit Summary.  MyChart is used to connect with patients for Virtual Visits (Telemedicine).  Patients are able to view lab/test results, encounter notes, upcoming appointments, etc.  Non-urgent messages can be sent to your provider as well.   To learn more about what you can do with MyChart, go to [email or web address].    Your next appointment:   6-9 month(s)  The format for your next appointment:   In Person  Provider:   You may see Lesleigh Noe, MD or one of the following Advanced Practice Providers on your designated Care Team:    Norma Fredrickson, NP  Nada Boozer, NP  Georgie Chard, NP    Other Instructions      Signed, Lesleigh Noe, MD  02/13/2020 10:20 AM    [facility]

## 2020-02-13 ENCOUNTER — Other Ambulatory Visit: Payer: Self-pay

## 2020-02-13 ENCOUNTER — Encounter: Payer: Self-pay | Admitting: Interventional Cardiology

## 2020-02-13 ENCOUNTER — Ambulatory Visit: Payer: PPO | Admitting: Interventional Cardiology

## 2020-02-13 VITALS — BP 120/78 | HR 83 | Ht 66.0 in | Wt 245.4 lb

## 2020-02-13 DIAGNOSIS — I4510 Unspecified right bundle-branch block: Secondary | ICD-10-CM

## 2020-02-13 DIAGNOSIS — Z01810 Encounter for preprocedural cardiovascular examination: Secondary | ICD-10-CM | POA: Diagnosis not present

## 2020-02-13 DIAGNOSIS — I25118 Atherosclerotic heart disease of native coronary artery with other forms of angina pectoris: Secondary | ICD-10-CM

## 2020-02-13 DIAGNOSIS — E785 Hyperlipidemia, unspecified: Secondary | ICD-10-CM

## 2020-02-13 DIAGNOSIS — I714 Abdominal aortic aneurysm, without rupture, unspecified: Secondary | ICD-10-CM

## 2020-02-13 DIAGNOSIS — I10 Essential (primary) hypertension: Secondary | ICD-10-CM

## 2020-02-13 DIAGNOSIS — Z7189 Other specified counseling: Secondary | ICD-10-CM | POA: Diagnosis not present

## 2020-02-13 NOTE — Patient Instructions (Signed)
Medication Instructions:  Your physician recommends that you continue on your current medications as directed. Please refer to the Current Medication list given to you today.  *If you need a refill on your cardiac medications before your next appointment, please call your pharmacy*   Lab Work: None If you have labs (blood work) drawn today and your tests are completely normal, you will receive your results only by: . MyChart Message (if you have MyChart) OR . A paper copy in the mail If you have any lab test that is abnormal or we need to change your treatment, we will call you to review the results.   Testing/Procedures: None   Follow-Up: At [facility], you and your health needs are our priority.  As part of our continuing mission to provide you with exceptional heart care, we have created designated Provider Care Teams.  These Care Teams include your primary Cardiologist (physician) and Advanced Practice Providers (APPs -  Physician Assistants and Nurse Practitioners) who all work together to provide you with the care you need, when you need it.  We recommend signing up for the patient portal called "MyChart".  Sign up information is provided on this After Visit Summary.  MyChart is used to connect with patients for Virtual Visits (Telemedicine).  Patients are able to view lab/test results, encounter notes, upcoming appointments, etc.  Non-urgent messages can be sent to your provider as well.   To learn more about what you can do with MyChart, go to [email or web address].    Your next appointment:   6-9 month(s)  The format for your next appointment:   In Person  Provider:   You may see Henry W Smith III, MD or one of the following Advanced Practice Providers on your designated Care Team:    Lori Gerhardt, NP  Laura Ingold, NP  Jill McDaniel, NP    Other Instructions   

## 2020-02-25 ENCOUNTER — Telehealth: Payer: Self-pay | Admitting: Pharmacist

## 2020-02-25 NOTE — Progress Notes (Addendum)
Chronic Care Management Pharmacy Assistant   Name: Cassandra Rios  MRN: [id number] DOB: Mar 01, 1951  Reason for Encounter: General Adherence Call  Patient Questions:  1.  Have you seen any other providers since your last visit? Yes  2.  Any changes in your medicines or health? Yes  PCP : [name], DO   Office Visits: 02-03-2020 (PCP) Patient presented in the office c/o low back pain. Requested to have her urine rechecked. She did report cellulitis of the right breast is better. PCP discontinued Fish Oil and Fluticasone nasal spray. Patient is to f/u with ortho for back pain.  Consults: 02-13-2020 (Cardio) Patient presented in the office for Cad follow up. Patient reports experiencing left subclavicular and precordial chest squeezing and spasm-like sensations two to three time a week. Dr. Katrinka Blazing encouraged moderate aerobic activity to total more than 150 minutes/week and continue regimen for BP.  Allergies:   Allergies  Allergen Reactions  . Codeine Itching and Nausea And Vomiting  . Hydromorphone Hcl Nausea And Vomiting  . Ibuprofen Other (See Comments)    stomach ulcers  . Morphine Sulfate     "makes my blood pressure bottom out"  . Metronidazole Hives, Nausea And Vomiting and Rash    Medications: Outpatient Encounter Medications as of 02/25/2020  Medication Sig  . acetaminophen (TYLENOL) 500 MG tablet Take 1,000 mg by mouth every 6 (six) hours as needed for moderate pain or headache.  Marland Kitchen aspirin EC 81 MG tablet Take 1 tablet (81 mg total) by mouth daily.  Marland Kitchen atorvastatin (LIPITOR) 80 MG tablet Take 1 tablet (80 mg total) by mouth daily.  . calcium carbonate (TUMS - DOSED IN MG ELEMENTAL CALCIUM) 500 MG chewable tablet Chew 2 tablets by mouth daily as needed for indigestion or heartburn.  . diphenhydramine-acetaminophen (TYLENOL PM) 25-500 MG TABS tablet Take 0.5-1 tablets by mouth at bedtime as needed (sleep).  . gabapentin (NEURONTIN) 300 MG capsule Take 600 mg  by mouth every evening.   Marland Kitchen HYDROcodone-acetaminophen (NORCO/VICODIN) 5-325 MG per tablet Take 0.5 tablets by mouth 2 (two) times daily as needed for moderate pain.   . Interferon Beta-1b (BETASERON/EXTAVIA) 0.3 MG KIT injection Inject 0.25 mg into the skin every other day.  . levocetirizine (XYZAL) 5 MG tablet Take 1 tablet (5 mg total) by mouth every evening.  Marland Kitchen levothyroxine (SYNTHROID) 112 MCG tablet TAKE 1 TABLET (112 MCG TOTAL) BY MOUTH DAILY BEFORE BREAKFAST.  . mometasone (NASONEX) 50 MCG/ACT nasal spray Place 2 sprays into the nose as needed.  . mupirocin ointment (BACTROBAN) 2 % Apply to area twice daily  . Polyethyl Glycol-Propyl Glycol (SYSTANE ULTRA OP) Place 1 drop into the right eye daily as needed (dry eye).   Marland Kitchen sertraline (ZOLOFT) 50 MG tablet Take 1 tablet (50 mg total) by mouth daily.  Marland Kitchen telmisartan-hydrochlorothiazide (MICARDIS HCT) 40-12.5 MG tablet Take 0.5 tablets by mouth daily.  . traMADol (ULTRAM) 50 MG tablet Take 1 tablet (50 mg total) by mouth every 6 (six) hours as needed.  . triamcinolone ointment (KENALOG) 0.1 % Apply 1 application topically every other day.    No facility-administered encounter medications on file as of 02/25/2020.    Current Diagnosis: Patient Active Problem List   Diagnosis Date Noted  . Cellulitis of breast 02/03/2020  . Flank pain 02/03/2020  . CAD (coronary artery disease), native coronary artery 07/09/2019  . DOE (dyspnea on exertion) 04/21/2019  . Preventative health care 04/21/2019  . AAA (abdominal aortic  aneurysm) (HCC) 04/19/2018  . Chronic headaches 04/19/2018  . Closed fracture of right distal fibula 12/29/2017  . Ankle fracture 12/29/2017  . Hyperlipidemia LDL goal <100 09/27/2017  . Senile purpura (HCC) 03/29/2017  . Lichen sclerosus of female genitalia 10/23/2016  . Breast pain, left 03/27/2016  . Chest pain 03/27/2016  . HTN (hypertension) 09/24/2015  . Sinusitis, acute 03/23/2015  . Depression 07/06/2014  .  Thoracic back pain 07/06/2014  . UTI (urinary tract infection) 04/28/2014  . Cellulitis and abscess of trunk 02/16/2014  . Severe obesity (BMI >= 40) (HCC) 06/30/2013  . Breast mass, right 01/10/2013  . Acute sinusitis 03/22/2009  . MULTIPLE SCLEROSIS, RELAPSING/REMITTING 01/04/2009  . THROMBOCYTOPENIA 09/15/2008  . SHOULDER PAIN, LEFT 09/15/2008  . BARIATRIC SURGERY STATUS 09/15/2008  . Hypothyroidism 07/20/2008  . HIATAL HERNIA 07/20/2008  . COPD with acute bronchitis (HCC) 07/23/2007  . ABNORMAL PULMONARY TEST RESULTS 07/23/2007  . Allergic rhinitis 06/26/2007  . HOARSENESS, CHRONIC 06/26/2007  . WHEEZING 06/26/2007  . SNORING 06/26/2007  . COUGH 06/26/2007  . TOBACCO ABUSE-HISTORY OF 06/26/2007  . COLONIC POLYPS, HYPERPLASTIC 06/25/2007  . Hyperlipidemia 06/25/2007  . MORBID OBESITY 06/25/2007  . Depression with anxiety 06/25/2007  . GASTRIC ULCER 06/25/2007  . COLONIC POLYPS, ADENOMATOUS, HX OF 06/25/2007    Goals Addressed   None    Patient inquired about her last urinalysis done at PCP visit. She stated her phone was not working for a few days and thought she may have missed a call from the nurse to provide her with the results. Informed the patient her recent urinalysis read all negative. She was more concerned about the white blood cell count. I also informed her that her appointment time was changed to 10:30 am on Monday October 11th with the clinical pharmacist over the phone.  Follow-Up:  Pharmacist Review   Corwin Levins, [facility] Clinical Pharmacist Assistant [phone]  Reviewed by: Katrinka Blazing, PharmD Clinical Pharmacist [facility] [phone]

## 2020-03-02 ENCOUNTER — Telehealth: Payer: Self-pay | Admitting: Pharmacist

## 2020-03-02 NOTE — Progress Notes (Addendum)
Verified Adherence Gap information. Per insurance data, the patient is 90-99% compliant with Statin medication; Atorvastatin 80 mg tab last filled 01-14-20 for 90 days, and 100% compliant with Ace/Arb medication Telmisartan/HCTZ 40-12.5 mg last filled 12-12-19 for 90 days.  Fanny Skates, [place] Pharmacist Assistant [phone]  Reviewed by: De Blanch, PharmD Clinical Pharmacist [facility] [phone]

## 2020-03-09 DIAGNOSIS — G8929 Other chronic pain: Secondary | ICD-10-CM | POA: Diagnosis not present

## 2020-03-09 DIAGNOSIS — M549 Dorsalgia, unspecified: Secondary | ICD-10-CM | POA: Diagnosis not present

## 2020-03-09 DIAGNOSIS — M5136 Other intervertebral disc degeneration, lumbar region: Secondary | ICD-10-CM | POA: Diagnosis not present

## 2020-03-09 DIAGNOSIS — I700 Atherosclerosis of aorta: Secondary | ICD-10-CM | POA: Diagnosis not present

## 2020-03-09 DIAGNOSIS — G894 Chronic pain syndrome: Secondary | ICD-10-CM | POA: Diagnosis not present

## 2020-03-10 ENCOUNTER — Other Ambulatory Visit: Payer: Self-pay

## 2020-03-10 DIAGNOSIS — I714 Abdominal aortic aneurysm, without rupture, unspecified: Secondary | ICD-10-CM

## 2020-03-12 ENCOUNTER — Other Ambulatory Visit: Payer: Self-pay

## 2020-03-12 ENCOUNTER — Ambulatory Visit: Payer: PPO | Admitting: Physician Assistant

## 2020-03-12 ENCOUNTER — Ambulatory Visit (HOSPITAL_COMMUNITY)
Admission: RE | Admit: 2020-03-12 | Discharge: 2020-03-12 | Disposition: A | Discharge status: Home / Self Care | Payer: PPO | Source: Ambulatory Visit | Attending: Vascular Surgery | Admitting: Vascular Surgery

## 2020-03-12 VITALS — BP 119/72 | HR 65 | Temp 98.0°F | Resp 20 | Ht 66.0 in | Wt 245.1 lb

## 2020-03-12 DIAGNOSIS — I714 Abdominal aortic aneurysm, without rupture, unspecified: Secondary | ICD-10-CM

## 2020-03-12 NOTE — Progress Notes (Signed)
History of Present Illness:  Patient is a 69 y.o. year old female who presents for evaluation of abdominal aortic aneurysm.  She has a known infrarenal AAA.  It was measured at 4.0 cm on her last visit 06/11/2018.    She she has had increasing lumbar pain that has gotten progressively worse over the past 2-3 months.  He pain seems to be increased with prolonged standing and ambulation.  the pain never completely subsides.  She is f/u by Dr. Nelva Bush for DDD.  His most recent lumbar x ray revealed an enlarged AAA and he advised her to f/u with [name] sooner than later.    Past medical history incl;udes MS, lumbar DDD, HTN, and hypercholesterolemia.  She takes daily ASA and Statin.   Past Medical History:  Diagnosis Date  . AAA (abdominal aortic aneurysm) ([place])   . Allergic rhinitis   . Chronic hoarseness   . Colon polyps   . COPD (chronic obstructive pulmonary disease) ([facility])    Dr. Chase Caller  . Dyslipidemia   . Hypertension   . Hypothyroidism   . MS (multiple sclerosis) ([place])   . Obesity   . Stomach ulcer from aspirin/ibuprofen-like drugs (NSAID's)     Past Surgical History:  Procedure Laterality Date  . ABDOMINAL HYSTERECTOMY    . BARIATRIC SURGERY  09/07/08   lap band  . CARPAL TUNNEL RELEASE    . CATARACT EXTRACTION Right 11/08/2017  . CATARACT EXTRACTION Left 11/22/2017  . CATARACT EXTRACTION, BILATERAL      Toric lenses for astigmatism  . CHOLECYSTECTOMY    . CHOLECYSTECTOMY    . HIATAL HERNIA REPAIR  09/07/08  . LEFT HEART CATH AND CORONARY ANGIOGRAPHY N/A 07/09/2019   Procedure: LEFT HEART CATH AND CORONARY ANGIOGRAPHY;  Surgeon: Belva Crome, MD;  Location: [facility];  Service: Cardiovascular;  Laterality: N/A;     Social History Social History   Tobacco Use  . Smoking status: Former Smoker    Packs/day: 2.00    Years: 40.00    Pack years: 80.00    Types: Cigarettes    Quit date: 04/05/2007    Years since quitting: 12.9  . Smokeless tobacco: Never Used    Vaping Use  . Vaping Use: Never assessed  Substance Use Topics  . Alcohol use: No  . Drug use: No    Family History Family History  Problem Relation Age of Onset  . COPD Father   . Heart disease Father   . AAA (abdominal aortic aneurysm) Father   . COPD Sister   . Arthritis Sister   . AAA (abdominal aortic aneurysm) Sister   . Dementia Sister   . Other Sister        brain injury  . Stroke Mother 49  . Heart disease Mother        congenital heart defect?    Allergies  Allergies  Allergen Reactions  . Codeine Itching and Nausea And Vomiting  . Hydromorphone Hcl Nausea And Vomiting  . Ibuprofen Other (See Comments)    stomach ulcers  . Morphine Sulfate     "makes my blood pressure bottom out"  . Metronidazole Hives, Nausea And Vomiting and Rash     Current Outpatient Medications  Medication Sig Dispense Refill  . acetaminophen (TYLENOL) 500 MG tablet Take 1,000 mg by mouth every 6 (six) hours as needed for moderate pain or headache.    Marland Kitchen aspirin EC 81 MG tablet Take 1 tablet (81 mg total)  by mouth daily. 90 tablet 3  . atorvastatin (LIPITOR) 80 MG tablet Take 1 tablet (80 mg total) by mouth daily. 90 tablet 3  . calcium carbonate (TUMS - DOSED IN MG ELEMENTAL CALCIUM) 500 MG chewable tablet Chew 2 tablets by mouth daily as needed for indigestion or heartburn.    . diphenhydramine-acetaminophen (TYLENOL PM) 25-500 MG TABS tablet Take 0.5-1 tablets by mouth at bedtime as needed (sleep).    . gabapentin (NEURONTIN) 300 MG capsule Take 600 mg by mouth every evening.     Marland Kitchen HYDROcodone-acetaminophen (NORCO/VICODIN) 5-325 MG per tablet Take 0.5 tablets by mouth 2 (two) times daily as needed for moderate pain.     . Interferon Beta-1b (BETASERON/EXTAVIA) 0.3 MG KIT injection Inject 0.25 mg into the skin every other day.    . levocetirizine (XYZAL) 5 MG tablet Take 1 tablet (5 mg total) by mouth every evening. 30 tablet 5  . levothyroxine (SYNTHROID) 112 MCG tablet TAKE 1  TABLET (112 MCG TOTAL) BY MOUTH DAILY BEFORE BREAKFAST. 90 tablet 0  . mometasone (NASONEX) 50 MCG/ACT nasal spray Place 2 sprays into the nose as needed.    Vladimir Faster Glycol-Propyl Glycol (SYSTANE ULTRA OP) Place 1 drop into the right eye daily as needed (dry eye).     Marland Kitchen sertraline (ZOLOFT) 50 MG tablet Take 1 tablet (50 mg total) by mouth daily. 90 tablet 1  . telmisartan-hydrochlorothiazide (MICARDIS HCT) 40-12.5 MG tablet Take 0.5 tablets by mouth daily. 45 tablet 3  . traMADol (ULTRAM) 50 MG tablet Take 1 tablet (50 mg total) by mouth every 6 (six) hours as needed. 30 tablet 0  . triamcinolone ointment (KENALOG) 0.1 % Apply 1 application topically every other day.   5   No current facility-administered medications for this visit.    ROS:   General:  No weight loss, Fever, chills  HEENT: No recent headaches, no nasal bleeding, no visual changes, no sore throat  Neurologic: No dizziness, blackouts, seizures. No recent symptoms of stroke or mini- stroke. No recent episodes of slurred speech, or temporary blindness.  Cardiac: No recent episodes of chest pain/pressure, no shortness of breath at rest.  No shortness of breath with exertion.  Denies history of atrial fibrillation or irregular heartbeat  Vascular: No history of rest pain in feet.  No history of claudication.  No history of non-healing ulcer, No history of DVT   Pulmonary: No home oxygen, no productive cough, no hemoptysis,  No asthma or wheezing  Musculoskeletal:  [ ]  Arthritis, [x ] Low back pain,  [ ]  Joint pain  Hematologic:No history of hypercoagulable state.  No history of easy bleeding.  No history of anemia  Gastrointestinal: No hematochezia or melena,  No gastroesophageal reflux, no trouble swallowing  Urinary: [ ]  chronic Kidney disease, [ ]  on HD - [ ]  MWF or [ ]  TTHS, [ ]  Burning with urination, [ ]  Frequent urination, [ ]  Difficulty urinating;   Skin: No rashes  Psychological: No history of anxiety,  No  history of depression   Physical Examination  Vitals:   03/12/20 0922  BP: 119/72  Pulse: 65  Resp: 20  Temp: 98 F (36.7 C)  TempSrc: Temporal  SpO2: 97%  Weight: 245 lb 1.6 oz (111.2 kg)  Height: 5' 6"  (1.676 m)    Body mass index is 39.56 kg/m.  General:  Alert and oriented, no acute distress HEENT: Normal Neck: No bruit or JVD Pulmonary: Clear to auscultation bilaterally Cardiac: Regular Rate and  Rhythm without murmur Gastrointestinal: Soft, tender to palpation central and left LQ to deep palpation, non-distended, no mass, no scars Skin: No rash Extremity Pulses:  2+ radial, brachial, femoral, dorsalis pedis, posterior tibial pulses bilaterally Musculoskeletal: No deformity or edema  Neurologic: Upper and lower extremity motor 5/5 and symmetric  DATA:     Abdominal Aorta Findings:  +-----------+-------+----------+----------+--------+--------+--------+  Location  AP (cm)Trans (cm)PSV (cm/s)WaveformThrombusComments  +-----------+-------+----------+----------+--------+--------+--------+  Proximal  2.39  1.85   88                  +-----------+-------+----------+----------+--------+--------+--------+  Mid    2.28  2.23   78                  +-----------+-------+----------+----------+--------+--------+--------+  Distal   4.42  4.03   46                  +-----------+-------+----------+----------+--------+--------+--------+  RT CIA Prox1.5  1.4    119                  +-----------+-------+----------+----------+--------+--------+--------+  LT CIA Prox1.4       175                  +-----------+-------+----------+----------+--------+--------+--------+    Summary:  Abdominal Aorta: There is evidence of abnormal dilatation of the distal  Abdominal aorta. The largest aortic diameter has increased compared to   prior exam. Previous diameter measurement was 4.0 cm obtained on 06/11/18.      ASSESSMENT:   Infrarenal AAA with increased AP demention from 4.0 to 4.42 cm and transverse 4.03.  She has what maybe symptoms of increasing lower and mid lumbar pain. As well as tenderness to palpation in the central and left LQ abdomin.    She denise symptoms of claudication and has B LE palpable pulses.     PLAN: I will schedule her for a CTA abd/pelvis for more detailed examination of the AAA.  She will f/u with Dr. Donzetta Matters for CTA review and examination.     Roxy Horseman PA-C [facility] Office: [phone]  MD in clinic [place]

## 2020-03-15 ENCOUNTER — Telehealth: Payer: PPO

## 2020-03-15 ENCOUNTER — Ambulatory Visit: Payer: Self-pay | Admitting: Pharmacist

## 2020-03-15 NOTE — Chronic Care Management (AMB) (Signed)
Patient was scheduled for 10:30am CCM Followup visit today 03/15/20.  My 9:00am Initial CCM patient visit ran until 10:50am. Requested Ivey to contact patient to inform patient I would call around 12:00pm or later. Ermalinda Barrios reports she called and left VM for patient informing her of this.   My schedule did not permit for call until 4:13pm. Sincerely apologized to patient for missing appointment time.  Patient understandably upset and does not wish to reschedule appt.  Encouraged patient that we would be willing to help any time she needs our assistance.  She states she will follow up with Dr. Etter Sjogren in November.   De Blanch, PharmD Clinical Pharmacist [facility] [phone]

## 2020-03-15 NOTE — Chronic Care Management (AMB) (Deleted)
Chronic Care Management Pharmacy  Name: Cassandra Rios  MRN: [id number] DOB: 12-22-1950  Chief Complaint/ HPI  Cassandra Rios,  69 y.o. , female presents for their Follow-Up CCM visit with the clinical pharmacist via telephone due to COVID-19 Pandemic.  PCP : [name], DO  Their chronic conditions include: COPD, HTN, HLD, CAD, Hypothyroidism, Depression, Allergic Rhinitis, Multiple Sclerosis, Back Pain, Lichen Sclerosis, Insomnia  Office Visits: 02/03/20: Visit w/ Dr. Laury Axon - ***  Consult Visits: 03/12/20: [facility] visit w/ Clinton Gallant, PA-C - Eval of AAA ***  02/13/20: Cardio visit w/ Dr. Katrinka Blazing - ***  Medications: Outpatient Encounter Medications as of 03/15/2020  Medication Sig  . acetaminophen (TYLENOL) 500 MG tablet Take 1,000 mg by mouth every 6 (six) hours as needed for moderate pain or headache.  Marland Kitchen aspirin EC 81 MG tablet Take 1 tablet (81 mg total) by mouth daily.  Marland Kitchen atorvastatin (LIPITOR) 80 MG tablet Take 1 tablet (80 mg total) by mouth daily.  . calcium carbonate (TUMS - DOSED IN MG ELEMENTAL CALCIUM) 500 MG chewable tablet Chew 2 tablets by mouth daily as needed for indigestion or heartburn.  . diphenhydramine-acetaminophen (TYLENOL PM) 25-500 MG TABS tablet Take 0.5-1 tablets by mouth at bedtime as needed (sleep).  . gabapentin (NEURONTIN) 300 MG capsule Take 600 mg by mouth every evening.   Marland Kitchen HYDROcodone-acetaminophen (NORCO/VICODIN) 5-325 MG per tablet Take 0.5 tablets by mouth 2 (two) times daily as needed for moderate pain.   . Interferon Beta-1b (BETASERON/EXTAVIA) 0.3 MG KIT injection Inject 0.25 mg into the skin every other Cassandra Rios.  . levocetirizine (XYZAL) 5 MG tablet Take 1 tablet (5 mg total) by mouth every evening.  Marland Kitchen levothyroxine (SYNTHROID) 112 MCG tablet TAKE 1 TABLET (112 MCG TOTAL) BY MOUTH DAILY BEFORE BREAKFAST.  . mometasone (NASONEX) 50 MCG/ACT nasal spray Place 2 sprays into the nose as needed.  [name]  (SYSTANE ULTRA OP) Place 1 drop into the right eye daily as needed (dry eye).   Marland Kitchen sertraline (ZOLOFT) 50 MG tablet Take 1 tablet (50 mg total) by mouth daily.  Marland Kitchen telmisartan-hydrochlorothiazide (MICARDIS HCT) 40-12.5 MG tablet Take 0.5 tablets by mouth daily.  . traMADol (ULTRAM) 50 MG tablet Take 1 tablet (50 mg total) by mouth every 6 (six) hours as needed.  . triamcinolone ointment (KENALOG) 0.1 % Apply 1 application topically every other Cassandra Rios.    No facility-administered encounter medications on file as of 03/15/2020.   Immunization History  Administered Date(s) Administered  . Fluad Quad(high Dose 65+) 04/21/2019  . H1N1 07/20/2008  . Influenza Whole 02/26/2008, 03/04/2010, 02/27/2013  . Influenza, High Dose Seasonal PF 04/01/2015, 03/27/2016, 03/13/2017, 04/19/2018  . Influenza,inj,quad, With Preservative 03/13/2017  . PFIZER SARS-COV-2 Vaccination 07/28/2019, 08/18/2019  . Pneumococcal Conjugate-13 04/01/2015  . Pneumococcal Polysaccharide-23 07/20/2008, 03/07/2011, 04/19/2018  . Tdap 03/07/2011  . Zoster 02/28/2010   Received first COVID vaccine on 07/28/19 Cassandra Rios). Receiving 2nd vaccine around 08/18/19.  Current Diagnosis/Assessment:  Goals Addressed   None    Social Hx:  Married. 2 kids. 5 grandchildren. 2 great grandchildren.  Uses pill box and fills them up 2 weeks at a time  COPD   Last spirometry score: 10/02/2007  FEV1 = 2.08L/ 77%  Gold Grade: Gold 2 (FEV1 50-79%) - Needs updated spirometry anticipate FEV1 decline Current COPD Classification:  A (low sx, <2 exacerbations/yr)  Eosinophil count:   Lab Results  Component Value Date/Time   EOSPCT 2.1 10/22/2018 08:56  AM   EOSPCT 3.6 07/07/2010 03:30 PM  %                               Eos (Absolute):  Lab Results  Component Value Date/Time   EOSABS 0.1 10/22/2018 08:56 AM   EOSABS 0.1 09/05/2016 09:44 AM   EOSABS 0.2 07/07/2010 03:30 PM    Tobacco Status:  Social History   Tobacco Use  Smoking  Status Former Smoker  . Packs/Cassandra Rios: 2.00  . Years: 40.00  . Pack years: 80.00  . Types: Cigarettes  . Quit date: 04/05/2007  . Years since quitting: 12.9  Smokeless Tobacco Never Used    Patient has failed these meds in past: None noted (advair listed in D/C meds. No apparent reason for D/C) Patient is currently uncontrolled on the following medications: None Using maintenance inhaler regularly? No Frequency of rescue inhaler use:  never   States she initially used a maintenance inhaler, but once she stopped smoking her COPD improved so she stopped the inhaler on her own.  Pt is agreeable to following up with pulmonology once she is fully vaccinated against Covid.   We discussed:  Importance of maintaining lung fuction and utility of maintanence inhalers to prevent lung fuction decline.   Plan -Follow up with pulmonology and complete updated spirometry -Recommend initiation of maintenance inhaler pending spirometry to prevent lung function decline  ,  Hypertension   CMP Latest Ref Rng & Units 10/21/2019 08/26/2019 07/03/2019  Glucose 70 - 99 mg/dL 409(W) - 119(J)  BUN 6 - 23 mg/dL 13 - 12  Creatinine 4.78 - 1.20 mg/dL 2.95 - 6.21  Sodium 308 - 145 mEq/L 136 - 138  Potassium 3.5 - 5.1 mEq/L 4.3 - 3.9  Chloride 96 - 112 mEq/L 102 - 100  CO2 19 - 32 mEq/L 29 - 25  Calcium 8.4 - 10.5 mg/dL 9.4 - 9.2  Total Protein 6.0 - 8.3 g/dL 6.6 6.2 -  Total Bilirubin 0.2 - 1.2 mg/dL 0.6 0.3 -  Alkaline Phos 39 - 117 U/L 75 92 -  AST 0 - 37 U/L 20 25 -  ALT 0 - 35 U/L 16 18 -     BP today is: Unable to assess due to phone visit  Office blood pressures are  BP Readings from Last 3 Encounters:  03/12/20 119/72  02/13/20 120/78  02/03/20 100/60    Patient has failed these meds in the past: None noted  Patient is currently controlled on the following medications: telmisartan-hctz 40-12.5mg  daily  Patient checks BP at home infrequently  Has a BP cuff Last checked couple of months  ago  We discussed Importance of assuring her BP is not too low, proper blood pressure measuring technique  Plan -Check BP 2-3 times per week -Continue current medications   Hyperlipidemia   Lipid Panel     Component Value Date/Time   CHOL 143 10/21/2019 0953   CHOL 130 08/26/2019 0731   TRIG 124.0 10/21/2019 0953   HDL 42.90 10/21/2019 0953   HDL 47 08/26/2019 0731   CHOLHDL 3 10/21/2019 0953   VLDL 24.8 10/21/2019 0953   LDLCALC 76 10/21/2019 0953   LDLCALC 65 08/26/2019 0731   LABVLDL 18 08/26/2019 0731     ASCVD 10-year risk: Has Hx of ASCVD  LDL goal of <70  Patient has failed these meds in past: None noted Patient is currently uncontrolled for LDL on the following medications:  atorvastatin 80mg  daily, fish oil 1000mg  daily  Blockages were not enough to stent Lab redraw on 3/23 Dr. Katrinka Blazing appt on 4/7  We discussed:  LDL goal of <100 vs <70 (recommend LDL of <70 noting hx of ASCVD)  Plan -Continue current medications  CAD   Patient has failed these meds in past: None noted Patient is currently controlled on the following medications: aspirin 81mg  daily, atorvastatin 80mg  daily, fish oil 1000mg  daily  Dr. Katrinka Blazing appt on 4/7 Beta blocker not started due to concerns with low systolic BP  We discussed:  importance of reducing risk of future ASCVD events  Plan -Continue current medications   Hypothyroidism   04/21/19: TSH = 0.82  Patient has failed these meds in past: None noted Patient is currently controlled on the following medications: levothyroxine daily  Plan -Continue current medications   Depression   Patient has failed these meds in past: fluoxetine (stopped working) Patient is currently controlled on the following medications: sertraline 50mg  daily, tums 500mg    Setraline: "set's my stomach on fire if I take it without food on my stomach"  Looking for more GI friendly option for depression  Considerations Sertraline is the worse  offender for GI side effects of the SSRI class Escitalopram: May have less GI side effect than sertraline (no cross taper needed) Duloxetine: May have similar GI side effect profile, but could also help with pain (no cross taper needed) Bupropion: May have less GI side effect, but need to consider risk of hypertension and exacerbated anxiety (cross tapering may be necessary)  PHQ9: 1 (1 - trouble falling asleep)  We discussed:  Dannielle Karvonen to share with Dr. Laury Axon alternatives that patient can discuss with Dr. Laury Axon at May follow up appointment  Plan -Continue current medications  -Consider alternative antidepressant with less GI side effects   Allergic Rhinitis   Patient has failed these meds in past: Took allergy injections in the past and they were not effective per patient Patient is currently controlled on the following medications: nasonex 50mg  2 sprays EN PRN, has rx for levocetirizine but hasn't had to take in a long time  Plan -Continue current medications   Multiple Sclerosis   Patient has failed these meds in past: None noted Patient is currently controlled on the following medications: interferon beta1-6 0.3mg  every other Cassandra Rios, tramadol 50mg  PRN from [facility] from interferon beta 1-b  Plan -Continue current medications   Back pain   Patient has failed these meds in past: steroid injections not effective. 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

## 2020-03-17 ENCOUNTER — Other Ambulatory Visit: Payer: Self-pay

## 2020-03-17 ENCOUNTER — Ambulatory Visit (HOSPITAL_COMMUNITY)
Admission: RE | Admit: 2020-03-17 | Discharge: 2020-03-17 | Disposition: A | Discharge status: Home / Self Care | Payer: PPO | Source: Ambulatory Visit | Attending: Vascular Surgery | Admitting: Vascular Surgery

## 2020-03-17 DIAGNOSIS — I714 Abdominal aortic aneurysm, without rupture, unspecified: Secondary | ICD-10-CM

## 2020-03-17 DIAGNOSIS — I7789 Other specified disorders of arteries and arterioles: Secondary | ICD-10-CM | POA: Diagnosis not present

## 2020-03-17 DIAGNOSIS — I700 Atherosclerosis of aorta: Secondary | ICD-10-CM | POA: Diagnosis not present

## 2020-03-17 DIAGNOSIS — I723 Aneurysm of iliac artery: Secondary | ICD-10-CM | POA: Diagnosis not present

## 2020-03-17 LAB — POCT I-STAT CREATININE: Creatinine, Ser: 0.9 mg/dL (ref 0.44–1.00)

## 2020-03-17 MED ORDER — IOHEXOL 350 MG/ML SOLN
100.0000 mL | Freq: Once | INTRAVENOUS | Status: AC | PRN
  Administered 2020-03-17: 100 mL via INTRAVENOUS

## 2020-03-19 ENCOUNTER — Other Ambulatory Visit: Payer: Self-pay

## 2020-03-19 ENCOUNTER — Ambulatory Visit: Payer: PPO | Admitting: Vascular Surgery

## 2020-03-19 ENCOUNTER — Encounter: Payer: Self-pay | Admitting: Vascular Surgery

## 2020-03-19 VITALS — BP 158/91 | HR 78 | Temp 97.2°F | Resp 16 | Ht 66.0 in | Wt 245.0 lb

## 2020-03-19 DIAGNOSIS — I714 Abdominal aortic aneurysm, without rupture, unspecified: Secondary | ICD-10-CM

## 2020-03-19 NOTE — Progress Notes (Signed)
Patient ID: Cassandra Rios, female   DOB: 06-Jul-1950, 69 y.o.   MRN: [id number]  Reason for Consult: AAA (f/u)   Referred by Zola Button, Grayling Congress, *  Subjective:     HPI:  Cassandra Rios is a 69 y.o. female history of small and slow-growing abdominal aortic aneurysm.  She was recently evaluated with x-ray which demonstrated probably 5 cm aneurysm this was subsequently confirmed to be less than 4.5.  Her sister did have an aneurysm she died from complications of surgical repair.  She does not have new abdominal or back pain.  She has CT scan performed prior to today's visit.  Past Medical History:  Diagnosis Date  . AAA (abdominal aortic aneurysm) (HCC)   . Allergic rhinitis   . Chronic hoarseness   . Colon polyps   . COPD (chronic obstructive pulmonary disease) (HCC)    Dr. Marchelle Gearing  . Dyslipidemia   . Hypertension   . Hypothyroidism   . MS (multiple sclerosis) (HCC)   . Obesity   . Stomach ulcer from aspirin/ibuprofen-like drugs (NSAID's)    Family History  Problem Relation Age of Onset  . COPD Father   . Heart disease Father   . AAA (abdominal aortic aneurysm) Father   . COPD Sister   . Arthritis Sister   . AAA (abdominal aortic aneurysm) Sister   . Dementia Sister   . Other Sister        brain injury  . Stroke Mother 12  . Heart disease Mother        congenital heart defect?   Past Surgical History:  Procedure Laterality Date  . ABDOMINAL HYSTERECTOMY    . BARIATRIC SURGERY  09/07/08   lap band  . CARPAL TUNNEL RELEASE    . CATARACT EXTRACTION Right 11/08/2017  . CATARACT EXTRACTION Left 11/22/2017  . CATARACT EXTRACTION, BILATERAL      Toric lenses for astigmatism  . CHOLECYSTECTOMY    . CHOLECYSTECTOMY    . HIATAL HERNIA REPAIR  09/07/08  . LEFT HEART CATH AND CORONARY ANGIOGRAPHY N/A 07/09/2019   Procedure: LEFT HEART CATH AND CORONARY ANGIOGRAPHY;  Surgeon: Lyn Records, MD;  Location: [facility];  Service: Cardiovascular;  Laterality:  N/A;    Short Social History:  Social History   Tobacco Use  . Smoking status: Former Smoker    Packs/day: 2.00    Years: 40.00    Pack years: 80.00    Types: Cigarettes    Quit date: 04/05/2007    Years since quitting: 12.9  . Smokeless tobacco: Never Used  Substance Use Topics  . Alcohol use: No    Allergies  Allergen Reactions  . Codeine Itching and Nausea And Vomiting  . Hydromorphone Hcl Nausea And Vomiting  . Ibuprofen Other (See Comments)    stomach ulcers  . Morphine Sulfate     "makes my blood pressure bottom out"  . Metronidazole Hives, Nausea And Vomiting and Rash    Current Outpatient Medications  Medication Sig Dispense Refill  . acetaminophen (TYLENOL) 500 MG tablet Take 1,000 mg by mouth every 6 (six) hours as needed for moderate pain or headache.    Marland Kitchen aspirin EC 81 MG tablet Take 1 tablet (81 mg total) by mouth daily. 90 tablet 3  . calcium carbonate (TUMS - DOSED IN MG ELEMENTAL CALCIUM) 500 MG chewable tablet Chew 2 tablets by mouth daily as needed for indigestion or heartburn.    . diphenhydramine-acetaminophen (TYLENOL PM)  25-500 MG TABS tablet Take 0.5-1 tablets by mouth at bedtime as needed (sleep).    . gabapentin (NEURONTIN) 300 MG capsule Take 600 mg by mouth every evening.     Marland Kitchen HYDROcodone-acetaminophen (NORCO/VICODIN) 5-325 MG per tablet Take 0.5 tablets by mouth 2 (two) times daily as needed for moderate pain.     . Interferon Beta-1b (BETASERON/EXTAVIA) 0.3 MG KIT injection Inject 0.25 mg into the skin every other day.    . levocetirizine (XYZAL) 5 MG tablet Take 1 tablet (5 mg total) by mouth every evening. 30 tablet 5  . levothyroxine (SYNTHROID) 112 MCG tablet TAKE 1 TABLET (112 MCG TOTAL) BY MOUTH DAILY BEFORE BREAKFAST. 90 tablet 0  . mometasone (NASONEX) 50 MCG/ACT nasal spray Place 2 sprays into the nose as needed.    Bertram Gala Glycol-Propyl Glycol (SYSTANE ULTRA OP) Place 1 drop into the right eye daily as needed (dry eye).     Marland Kitchen  sertraline (ZOLOFT) 50 MG tablet Take 1 tablet (50 mg total) by mouth daily. 90 tablet 1  . telmisartan-hydrochlorothiazide (MICARDIS HCT) 40-12.5 MG tablet Take 0.5 tablets by mouth daily. 45 tablet 3  . traMADol (ULTRAM) 50 MG tablet Take 1 tablet (50 mg total) by mouth every 6 (six) hours as needed. 30 tablet 0  . triamcinolone ointment (KENALOG) 0.1 % Apply 1 application topically every other day.   5  . atorvastatin (LIPITOR) 80 MG tablet Take 1 tablet (80 mg total) by mouth daily. 90 tablet 3   No current facility-administered medications for this visit.    Review of Systems  Constitutional:  Constitutional negative. HENT: HENT negative.  Eyes: Eyes negative.  Respiratory: Respiratory negative.  Cardiovascular: Cardiovascular negative.  GI: Gastrointestinal negative.  Musculoskeletal: Musculoskeletal negative.  Skin: Skin negative.  Neurological: Neurological negative. Hematologic: Hematologic/lymphatic negative.  Psychiatric: Psychiatric negative.        Objective:  Objective   Vitals:   03/19/20 1125  BP: (!) 158/91  Pulse: 78  Resp: 16  Temp: (!) 97.2 F (36.2 C)  TempSrc: Temporal  SpO2: 91%  Weight: 245 lb (111.1 kg)  Height: 5\' 6"  (1.676 m)   Body mass index is 39.54 kg/m.  Physical Exam Constitutional:      Appearance: She is obese.  HENT:     Head: Normocephalic.     Nose:     Comments: Wearing a mask Eyes:     Pupils: Pupils are equal, round, and reactive to light.  Cardiovascular:     Rate and Rhythm: Normal rate.     Pulses:          Radial pulses are 2+ on the right side and 2+ on the left side.       Dorsalis pedis pulses are 2+ on the right side and 2+ on the left side.  Pulmonary:     Effort: Pulmonary effort is normal.  Abdominal:     General: Abdomen is flat.     Palpations: Abdomen is soft.  Musculoskeletal:        General: No swelling. Normal range of motion.  Skin:    General: Skin is warm and dry.     Capillary Refill:  Capillary refill takes less than 2 seconds.  Neurological:     General: No focal deficit present.     Mental Status: She is alert.  Psychiatric:        Mood and Affect: Mood normal.        Behavior: Behavior normal.  Thought Content: Thought content normal.        Judgment: Judgment normal.     Data: CT IMPRESSION: VASCULAR  1. Infrarenal abdominal aortic aneurysm measuring up to 4.2 cm. Recommend follow-up every 12 months. This recommendation follows ACR consensus guidelines: White Paper of the ACR Incidental Findings Committee II on Vascular Findings. J Am Coll Radiol 2013; 10:789-794. 2. Ectasia of the common iliac arteries measuring 1.6 cm bilaterally. 3.  Aortic Atherosclerosis (ICD10-I70.0).  We reviewed her CT scan together which demonstrates 4.2 cm aneurysm.  I also answered questions regarding her lap band placement.     Assessment/Plan:     46-year-old female with 4.2 cm abdominal aortic aneurysm.  At this time we will see her in 1 year with duplex and if this is approaching 5 cm we will need to get CT scan to follow.  Otherwise she can be on yearly follow-up.  She does appear to be a good candidate for endovascular repair with the current morphology of the aneurysm.  We discussed the signs and symptoms of rupture although unlikely.  She demonstrates good understanding we will follow up in 1 year.     Maeola Harman MD [facility]

## 2020-03-25 DIAGNOSIS — M5136 Other intervertebral disc degeneration, lumbar region: Secondary | ICD-10-CM | POA: Insufficient documentation

## 2020-03-25 DIAGNOSIS — M51369 Other intervertebral disc degeneration, lumbar region without mention of lumbar back pain or lower extremity pain: Secondary | ICD-10-CM | POA: Insufficient documentation

## 2020-04-07 ENCOUNTER — Telehealth: Payer: Self-pay | Admitting: Pharmacist

## 2020-04-07 NOTE — Progress Notes (Addendum)
Chronic Care Management Pharmacy Assistant   Name: Cassandra Rios  MRN: [id number] DOB: 1951/02/05  Reason for Encounter: Disease State  Patient Questions:  1.  Have you seen any other providers since your last visit? Yes  2.  Any changes in your medicines or health? No    PCP : [name], DO    Office Visits: None since their last disease state call with the pharmacist assistant on 02-25-2020.   Consults: 03-19-2020 (Vasc Surg): Patient presented in the office to f/u on hx of small and slow-growing abdominal aortic aneurysm. She was recently evaluated with x-ray which demonstrated probably 5 cm aneurysm this was subsequently confirmed to be less than 4.5. Was advised to have CT done if aneurysm increases to 5 cm. Follow up in one year.  03-12-2020 (Vasc Surg) Patient presented in the office for an evaluation of abdominal aortic aneurysm that was measured at 4.0 cm on her last visit 06/11/2018. The patient reports increasing lumbar pain that has gotten progressively worse over the past 2-3 months.  The pain seems to be increased with prolonged standing and ambulation.  the pain never completely subsides.  Allergies:   Allergies  Allergen Reactions   Codeine Itching and Nausea And Vomiting   Hydromorphone Hcl Nausea And Vomiting   Ibuprofen Other (See Comments)    stomach ulcers   Morphine Sulfate     "makes my blood pressure bottom out"   Metronidazole Hives, Nausea And Vomiting and Rash    Medications: Outpatient Encounter Medications as of 04/07/2020  Medication Sig   acetaminophen (TYLENOL) 500 MG tablet Take 1,000 mg by mouth every 6 (six) hours as needed for moderate pain or headache.   aspirin EC 81 MG tablet Take 1 tablet (81 mg total) by mouth daily.   atorvastatin (LIPITOR) 80 MG tablet Take 1 tablet (80 mg total) by mouth daily.   calcium carbonate (TUMS - DOSED IN MG ELEMENTAL CALCIUM) 500 MG chewable tablet Chew 2 tablets by mouth daily as  needed for indigestion or heartburn.   diphenhydramine-acetaminophen (TYLENOL PM) 25-500 MG TABS tablet Take 0.5-1 tablets by mouth at bedtime as needed (sleep).   gabapentin (NEURONTIN) 300 MG capsule Take 600 mg by mouth every evening.    HYDROcodone-acetaminophen (NORCO/VICODIN) 5-325 MG per tablet Take 0.5 tablets by mouth 2 (two) times daily as needed for moderate pain.    Interferon Beta-1b (BETASERON/EXTAVIA) 0.3 MG KIT injection Inject 0.25 mg into the skin every other day.   levocetirizine (XYZAL) 5 MG tablet Take 1 tablet (5 mg total) by mouth every evening.   levothyroxine (SYNTHROID) 112 MCG tablet TAKE 1 TABLET (112 MCG TOTAL) BY MOUTH DAILY BEFORE BREAKFAST.   mometasone (NASONEX) 50 MCG/ACT nasal spray Place 2 sprays into the nose as needed.   Polyethyl Glycol-Propyl Glycol (SYSTANE ULTRA OP) Place 1 drop into the right eye daily as needed (dry eye).    sertraline (ZOLOFT) 50 MG tablet Take 1 tablet (50 mg total) by mouth daily.   telmisartan-hydrochlorothiazide (MICARDIS HCT) 40-12.5 MG tablet Take 0.5 tablets by mouth daily.   traMADol (ULTRAM) 50 MG tablet Take 1 tablet (50 mg total) by mouth every 6 (six) hours as needed.   triamcinolone ointment (KENALOG) 0.1 % Apply 1 application topically every other day.    No facility-administered encounter medications on file as of 04/07/2020.    Current Diagnosis: Patient Active Problem List   Diagnosis Date Noted   Cellulitis of breast 02/03/2020  Flank pain 02/03/2020   CAD (coronary artery disease), native coronary artery 07/09/2019   DOE (dyspnea on exertion) 04/21/2019   Preventative health care 04/21/2019   AAA (abdominal aortic aneurysm) (HCC) 04/19/2018   Chronic headaches 04/19/2018   Lichen sclerosus et atrophicus 03/31/2018   Sinus headache 03/18/2018   Closed fracture of right distal fibula 12/29/2017   Ankle fracture 12/29/2017   Osteoarthritis of acromioclavicular joint 11/05/2017    Impingement syndrome of left shoulder region 11/05/2017   Hyperlipidemia LDL goal <100 09/27/2017   Senile purpura (HCC) 03/29/2017   Lichen sclerosus of female genitalia 10/23/2016   Breast pain, left 03/27/2016   Chest pain 03/27/2016   HTN (hypertension) 09/24/2015   Sinusitis, acute 03/23/2015   Depression 07/06/2014   Thoracic back pain 07/06/2014   UTI (urinary tract infection) 04/28/2014   Cellulitis and abscess of trunk 02/16/2014   Severe obesity (BMI >= 40) (HCC) 06/30/2013   Breast mass, right 01/10/2013   Acute sinusitis 03/22/2009   MULTIPLE SCLEROSIS, RELAPSING/REMITTING 01/04/2009   THROMBOCYTOPENIA 09/15/2008   SHOULDER PAIN, LEFT 09/15/2008   BARIATRIC SURGERY STATUS 09/15/2008   Hypothyroidism 07/20/2008   HIATAL HERNIA 07/20/2008   COPD with acute bronchitis (HCC) 07/23/2007   ABNORMAL PULMONARY TEST RESULTS 07/23/2007   Allergic rhinitis 06/26/2007   HOARSENESS, CHRONIC 06/26/2007   WHEEZING 06/26/2007   SNORING 06/26/2007   COUGH 06/26/2007   TOBACCO ABUSE-HISTORY OF 06/26/2007   COLONIC POLYPS, HYPERPLASTIC 06/25/2007   Hyperlipidemia 06/25/2007   MORBID OBESITY 06/25/2007   Depression with anxiety 06/25/2007   GASTRIC ULCER 06/25/2007   COLONIC POLYPS, ADENOMATOUS, HX OF 06/25/2007   Anxiety 06/25/2007    Goals Addressed   None     Contacted the patient for a General Adherence call. Inquired with  the patient how she was doing, and if there were any issues that she would like to discuss with the clinical pharmacist. Patient stated that she was doing well and did not have anything she would like to address at the moment. She does not wish to continue visits with the clinical pharmacist at this time. Patient stated she has an appointment with Dr. Laury Axon on November the 16th and would review her medication needs with her..   Follow-Up:  Pharmacist Review   Corwin Levins, [facility] Clinical Pharmacist  Assistant [phone]  If patient ever needs assistance from pharmacy team happy to help patient however possible.  Reviewed by: Katrinka Blazing, PharmD Clinical Pharmacist [facility] [phone]

## 2020-04-15 DIAGNOSIS — M5136 Other intervertebral disc degeneration, lumbar region: Secondary | ICD-10-CM | POA: Diagnosis not present

## 2020-04-16 ENCOUNTER — Encounter (HOSPITAL_COMMUNITY): Payer: Self-pay | Admitting: Emergency Medicine

## 2020-04-16 ENCOUNTER — Other Ambulatory Visit: Payer: Self-pay

## 2020-04-16 ENCOUNTER — Ambulatory Visit (HOSPITAL_COMMUNITY)
Admission: EM | Admit: 2020-04-16 | Discharge: 2020-04-16 | Disposition: A | Discharge status: Home / Self Care | Payer: PPO | Attending: Family Medicine | Admitting: Family Medicine

## 2020-04-16 DIAGNOSIS — R0981 Nasal congestion: Secondary | ICD-10-CM | POA: Insufficient documentation

## 2020-04-16 DIAGNOSIS — J029 Acute pharyngitis, unspecified: Secondary | ICD-10-CM

## 2020-04-16 DIAGNOSIS — J069 Acute upper respiratory infection, unspecified: Secondary | ICD-10-CM | POA: Diagnosis not present

## 2020-04-16 LAB — POCT RAPID STREP A, ED / UC: Streptococcus, Group A Screen (Direct): NEGATIVE

## 2020-04-16 MED ORDER — LIDOCAINE VISCOUS HCL 2 % MT SOLN: 15.0000 mL | OROMUCOSAL | 0 refills | Status: DC | PRN

## 2020-04-16 MED ORDER — LEVOCETIRIZINE DIHYDROCHLORIDE 5 MG PO TABS: 5.0000 mg | ORAL_TABLET | Freq: Every evening | ORAL | 5 refills | Status: DC

## 2020-04-16 NOTE — Discharge Instructions (Addendum)
Resume Nasonex and levocetirizine daily at bedtime for sinus congestion and postnasal drainage which is likely causing your throat pain. Mix lidocaine viscous with warm salt water and gargle and spit for management of throat pain. Throat culture is pending.

## 2020-04-16 NOTE — ED Provider Notes (Signed)
[facility]    CSN: [id number] Arrival date & time: 04/16/20  0841      History   Chief Complaint Chief Complaint  Patient presents with  . Sore Throat    HPI Cassandra Rios is a 69 y.o. female.   HPI  Patient presents today with a concern of sore throat times x 1 days.  Associated symptoms include cough and lymph node swelling.  She is fully vaccinated against COVID-19 she has not yet received her influenza vaccine.  She has remained afebrile.  She endorses a headache and some sinus facial pressure although has not had any drainage or any other symptoms.  And she has remained afebrile.  She is taking hydrocodone for the headache/facial pressure without relief. She is not prescribed any daily antihistamine therapy for chronic allergies. Past Medical History:  Diagnosis Date  . AAA (abdominal aortic aneurysm) ([place])   . Allergic rhinitis   . Chronic hoarseness   . Colon polyps   . COPD (chronic obstructive pulmonary disease) (HCC)    Dr. Chase Caller  . Dyslipidemia   . Hypertension   . Hypothyroidism   . MS (multiple sclerosis) ([place])   . Obesity   . Stomach ulcer from aspirin/ibuprofen-like drugs (NSAID's)     Patient Active Problem List   Diagnosis Date Noted  . Cellulitis of breast 02/03/2020  . Flank pain 02/03/2020  . CAD (coronary artery disease), native coronary artery 07/09/2019  . DOE (dyspnea on exertion) 04/21/2019  . Preventative health care 04/21/2019  . AAA (abdominal aortic aneurysm) ([place]) 04/19/2018  . Chronic headaches 04/19/2018  . Lichen sclerosus et atrophicus 03/31/2018  . Sinus headache 03/18/2018  . Closed fracture of right distal fibula 12/29/2017  . Ankle fracture 12/29/2017  . Osteoarthritis of acromioclavicular joint 11/05/2017  . Impingement syndrome of left shoulder region 11/05/2017  . Hyperlipidemia LDL goal <100 09/27/2017  . Senile purpura ([place]) 03/29/2017  . Lichen sclerosus of female genitalia 10/23/2016  . Breast  pain, left 03/27/2016  . Chest pain 03/27/2016  . HTN (hypertension) 09/24/2015  . Sinusitis, acute 03/23/2015  . Depression 07/06/2014  . Thoracic back pain 07/06/2014  . UTI (urinary tract infection) 04/28/2014  . Cellulitis and abscess of trunk 02/16/2014  . Severe obesity (BMI >= 40) ([place]) 06/30/2013  . Breast mass, right 01/10/2013  . Acute sinusitis 03/22/2009  . MULTIPLE SCLEROSIS, RELAPSING/REMITTING 01/04/2009  . THROMBOCYTOPENIA 09/15/2008  . SHOULDER PAIN, LEFT 09/15/2008  . BARIATRIC SURGERY STATUS 09/15/2008  . Hypothyroidism 07/20/2008  . HIATAL HERNIA 07/20/2008  . COPD with acute bronchitis ([place]) 07/23/2007  . ABNORMAL PULMONARY TEST RESULTS 07/23/2007  . Allergic rhinitis 06/26/2007  . HOARSENESS, CHRONIC 06/26/2007  . WHEEZING 06/26/2007  . SNORING 06/26/2007  . COUGH 06/26/2007  . TOBACCO ABUSE-HISTORY OF 06/26/2007  . COLONIC POLYPS, HYPERPLASTIC 06/25/2007  . Hyperlipidemia 06/25/2007  . MORBID OBESITY 06/25/2007  . Depression with anxiety 06/25/2007  . GASTRIC ULCER 06/25/2007  . COLONIC POLYPS, ADENOMATOUS, HX OF 06/25/2007  . Anxiety 06/25/2007    Past Surgical History:  Procedure Laterality Date  . ABDOMINAL HYSTERECTOMY    . BARIATRIC SURGERY  09/07/08   lap band  . CARPAL TUNNEL RELEASE    . CATARACT EXTRACTION Right 11/08/2017  . CATARACT EXTRACTION Left 11/22/2017  . CATARACT EXTRACTION, BILATERAL      Toric lenses for astigmatism  . CHOLECYSTECTOMY    . CHOLECYSTECTOMY    . HIATAL HERNIA REPAIR  09/07/08  . LEFT HEART CATH AND CORONARY  ANGIOGRAPHY N/A 07/09/2019   Procedure: LEFT HEART CATH AND CORONARY ANGIOGRAPHY;  Surgeon: Belva Crome, MD;  Location: [facility];  Service: Cardiovascular;  Laterality: N/A;    OB History    Gravida  3   Para  2   Term  2   Preterm      AB      Living        SAB      TAB      Ectopic      Multiple      Live Births  2            Home Medications    Prior to Admission  medications   Medication Sig Start Date End Date Taking? Authorizing Provider  acetaminophen (TYLENOL) 500 MG tablet Take 1,000 mg by mouth every 6 (six) hours as needed for moderate pain or headache.   Yes [provider]  ALPRAZolam (XANAX) 0.25 MG tablet Take 0.25 mg by mouth once.   Yes [provider]  aspirin EC 81 MG tablet Take 1 tablet (81 mg total) by mouth daily. 07/03/19  Yes Isaiah Serge, NP  gabapentin (NEURONTIN) 300 MG capsule Take 600 mg by mouth every evening.  09/19/18  Yes [provider]  HYDROcodone-acetaminophen (NORCO/VICODIN) 5-325 MG per tablet Take 0.5 tablets by mouth 2 (two) times daily as needed for moderate pain.    Yes [provider]  Interferon Beta-1b (BETASERON/EXTAVIA) 0.3 MG KIT injection Inject 0.25 mg into the skin every other day.   Yes [provider]  levothyroxine (SYNTHROID) 112 MCG tablet TAKE 1 TABLET (112 MCG TOTAL) BY MOUTH DAILY BEFORE BREAKFAST. 02/02/20  Yes Roma Schanz R, DO  Polyethyl Glycol-Propyl Glycol (SYSTANE ULTRA OP) Place 1 drop into the right eye daily as needed (dry eye).    Yes [provider]  sertraline (ZOLOFT) 50 MG tablet Take 1 tablet (50 mg total) by mouth daily. 12/10/19  Yes Roma Schanz R, DO  telmisartan-hydrochlorothiazide (MICARDIS HCT) 40-12.5 MG tablet Take 0.5 tablets by mouth daily. 09/10/19  Yes Belva Crome, MD  triamcinolone ointment (KENALOG) 0.1 % Apply 1 application topically every other day.  03/29/18  Yes [provider]  atorvastatin (LIPITOR) 80 MG tablet Take 1 tablet (80 mg total) by mouth daily. 07/10/19 03/12/20  Belva Crome, MD  calcium carbonate (TUMS - DOSED IN MG ELEMENTAL CALCIUM) 500 MG chewable tablet Chew 2 tablets by mouth daily as needed for indigestion or heartburn.    [provider]  diphenhydramine-acetaminophen (TYLENOL PM) 25-500 MG TABS tablet Take 0.5-1 tablets by mouth at bedtime as needed (sleep).     [provider]  levocetirizine (XYZAL) 5 MG tablet Take 1 tablet (5 mg total) by mouth every evening. 10/21/19   Carollee Herter, Yvonne R, DO  mometasone (NASONEX) 50 MCG/ACT nasal spray Place 2 sprays into the nose as needed.    [provider]  traMADol (ULTRAM) 50 MG tablet Take 1 tablet (50 mg total) by mouth every 6 (six) hours as needed. 10/21/19   Ann Held, DO    Family History Family History  Problem Relation Age of Onset  . COPD Father   . Heart disease Father   . AAA (abdominal aortic aneurysm) Father   . COPD Sister   . Arthritis Sister   . AAA (abdominal aortic aneurysm) Sister   . Dementia Sister   . Other Sister  brain injury  . Stroke Mother 77  . Heart disease Mother        congenital heart defect?    Social History Social History   Tobacco Use  . Smoking status: Former Smoker    Packs/day: 2.00    Years: 40.00    Pack years: 80.00    Types: Cigarettes    Quit date: 04/05/2007    Years since quitting: 13.0  . Smokeless tobacco: Never Used  Vaping Use  . Vaping Use: Never assessed  Substance Use Topics  . Alcohol use: No  . Drug use: No     Allergies   Codeine, Hydromorphone hcl, Ibuprofen, Morphine sulfate, and Metronidazole   Review of Systems Review of Systems Pertinent negatives listed in HPI  Physical Exam Triage Vital Signs ED Triage Vitals  Enc Vitals Group     BP 04/16/20 0906 (!) 160/94     Pulse Rate 04/16/20 0906 74     Resp 04/16/20 0906 16     Temp 04/16/20 0906 98.2 F (36.8 C)     Temp Source 04/16/20 0906 Oral     SpO2 04/16/20 0906 96 %     Weight --      Height --      Head Circumference --      Peak Flow --      Pain Score 04/16/20 0859 8     Pain Loc --      Pain Edu? --      Excl. in GC? --    No data found.  Updated Vital Signs BP (!) 160/94 (BP Location: Right Arm)   Pulse 74   Temp 98.2 F (36.8 C) (Oral)   Resp 16   SpO2 96%   Visual Acuity Right Eye Distance:     Left Eye Distance:   Bilateral Distance:    Right Eye Near:   Left Eye Near:    Bilateral Near:     Physical Exam General Appearance:    Alert, cooperative, no distress  HENT:   neck without nodes, throat normal without erythema or exudate and maxillary sinus tender, congestion present   Eyes:    PERRL, conjunctiva/corneas clear, EOM's intact       Lungs:     Clear to auscultation bilaterally, respirations unlabored  Heart:    Regular rate and rhythm  Neurologic:   Awake, alert, oriented x 3. No apparent focal neurological           defect.         UC Treatments / Results  Labs (all labs ordered are listed, but only abnormal results are displayed) Labs Reviewed  POCT RAPID STREP A, ED / UC    EKG   Radiology No results found.  Procedures Procedures (including critical care time)  Medications Ordered in UC Medications - No data to display  Initial Impression / Assessment and Plan / UC Course  I have reviewed the triage vital signs and the nursing notes.  Pertinent labs & imaging results that were available during my care of the patient were reviewed by me and considered in my medical decision making (see chart for details).    Patient with acute viral pharyngitis rapid strep negative.  Throat culture pending.  Symptomatic management indicated only as symptoms likely related to sinus congestion.  Resume levocetirizine, Nasonex, lidocaine viscous gargles as needed for throat pain.  Follow-up with PCP if symptoms worsen or do not improve.  Low risk COVID-19 as patient has had   both vaccines and booster against COVID-19.  Afebrile therefore no concern for influenza today.  Final Clinical Impressions(s) / UC Diagnoses   Final diagnoses:  Pharyngitis, unspecified etiology  Sinus congestion     Discharge Instructions     Resume Nasonex and levocetirizine daily at bedtime for sinus congestion and postnasal drainage which is likely causing your throat pain. Mix lidocaine  viscous with warm salt water and gargle and spit for management of throat pain. Throat culture is pending.    ED Prescriptions    Medication Sig Dispense Auth. Provider   lidocaine (XYLOCAINE) 2 % solution  (Status: Discontinued) Use as directed 15 mLs in the mouth or throat as needed for mouth pain. 100 mL Scot Jun, FNP   levocetirizine (XYZAL) 5 MG tablet Take 1 tablet (5 mg total) by mouth every evening. 30 tablet Scot Jun, FNP   lidocaine (XYLOCAINE) 2 % solution Use as directed 15 mLs in the mouth or throat as needed for mouth pain (mix with warm water gargle and spit as needed for throat pain). 100 mL Scot Jun, FNP     PDMP not reviewed this encounter.   Scot Jun, FNP 04/16/20 1032

## 2020-04-16 NOTE — ED Triage Notes (Signed)
Patient c/o sore throat, "lymph node swelling", and cough since yesterday.   Patient endorses difficulty swallowing and headache.   Patient has used Tylenol at home w/ no relief of symptoms.   Patient denies nasal congestion and fever.   Patient states "grandson has been diagnosed with strep throat".

## 2020-04-18 LAB — CULTURE, GROUP A STREP (THRC)

## 2020-04-20 ENCOUNTER — Ambulatory Visit (INDEPENDENT_AMBULATORY_CARE_PROVIDER_SITE_OTHER): Payer: PPO | Admitting: Family Medicine

## 2020-04-20 ENCOUNTER — Other Ambulatory Visit: Payer: Self-pay

## 2020-04-20 ENCOUNTER — Encounter: Payer: Self-pay | Admitting: Family Medicine

## 2020-04-20 VITALS — BP 124/82 | HR 82 | Temp 97.7°F | Resp 18 | Ht 66.0 in | Wt 247.2 lb

## 2020-04-20 DIAGNOSIS — F418 Other specified anxiety disorders: Secondary | ICD-10-CM | POA: Diagnosis not present

## 2020-04-20 DIAGNOSIS — Z23 Encounter for immunization: Secondary | ICD-10-CM

## 2020-04-20 DIAGNOSIS — E039 Hypothyroidism, unspecified: Secondary | ICD-10-CM | POA: Diagnosis not present

## 2020-04-20 DIAGNOSIS — G8929 Other chronic pain: Secondary | ICD-10-CM | POA: Diagnosis not present

## 2020-04-20 DIAGNOSIS — I10 Essential (primary) hypertension: Secondary | ICD-10-CM

## 2020-04-20 DIAGNOSIS — G35 Multiple sclerosis: Secondary | ICD-10-CM

## 2020-04-20 DIAGNOSIS — I714 Abdominal aortic aneurysm, without rupture, unspecified: Secondary | ICD-10-CM

## 2020-04-20 DIAGNOSIS — M545 Low back pain, unspecified: Secondary | ICD-10-CM | POA: Diagnosis not present

## 2020-04-20 DIAGNOSIS — T7840XA Allergy, unspecified, initial encounter: Secondary | ICD-10-CM | POA: Diagnosis not present

## 2020-04-20 DIAGNOSIS — E785 Hyperlipidemia, unspecified: Secondary | ICD-10-CM

## 2020-04-20 LAB — COMPREHENSIVE METABOLIC PANEL
ALT: 17 U/L (ref 0–35)
AST: 21 U/L (ref 0–37)
Albumin: 3.9 g/dL (ref 3.5–5.2)
Alkaline Phosphatase: 81 U/L (ref 39–117)
BUN: 13 mg/dL (ref 6–23)
CO2: 29 mEq/L (ref 19–32)
Calcium: 9.2 mg/dL (ref 8.4–10.5)
Chloride: 102 mEq/L (ref 96–112)
Creatinine, Ser: 0.85 mg/dL (ref 0.40–1.20)
GFR: 69.52 mL/min (ref >60.00)
Glucose, Bld: 103 mg/dL — ABNORMAL HIGH (ref 70–99)
Potassium: 4.8 mEq/L (ref 3.5–5.1)
Sodium: 137 mEq/L (ref 135–145)
Total Bilirubin: 0.6 mg/dL (ref 0.2–1.2)
Total Protein: 6.7 g/dL (ref 6.0–8.3)

## 2020-04-20 LAB — LIPID PANEL
Cholesterol: 154 mg/dL (ref 0–200)
HDL: 48.8 mg/dL (ref >39.00)
LDL Cholesterol: 77 mg/dL (ref 0–99)
NonHDL: 105.61
Total CHOL/HDL Ratio: 3
Triglycerides: 145 mg/dL (ref 0.0–149.0)
VLDL: 29 mg/dL (ref 0.0–40.0)

## 2020-04-20 LAB — TSH: TSH: 3.63 u[IU]/mL (ref 0.35–4.50)

## 2020-04-20 MED ORDER — AZELASTINE HCL 0.1 % NA SOLN: 1.0000 | Freq: Two times a day (BID) | NASAL | 12 refills | Status: DC

## 2020-04-20 NOTE — Assessment & Plan Note (Signed)
Per ortho.  

## 2020-04-20 NOTE — Assessment & Plan Note (Addendum)
Check labs con't synthroid 

## 2020-04-20 NOTE — Assessment & Plan Note (Signed)
Per neuro 

## 2020-04-20 NOTE — Assessment & Plan Note (Signed)
Stable  con't meds Cont zoloft

## 2020-04-20 NOTE — Assessment & Plan Note (Signed)
[facility]-- dr Donzetta Matters

## 2020-04-20 NOTE — Assessment & Plan Note (Addendum)
Tolerating statin, encouraged heart healthy diet, avoid trans fats, minimize simple carbs and saturated fats. Increase exercise as tolerated con't lipitor

## 2020-04-20 NOTE — Progress Notes (Signed)
Patient ID: Cassandra Rios, female    DOB: 25-May-1951  Age: 69 y.o. MRN: [id number]    Subjective:  Subjective  HPI Cassandra Rios presents for f/u bp and cholesterol   She has been to dr Ethelene Hal for her back and was given an injection which helped a lot      Review of Systems  Constitutional: Negative for appetite change, diaphoresis, fatigue and unexpected weight change.  Eyes: Negative for pain, redness and visual disturbance.  Respiratory: Negative for cough, chest tightness, shortness of breath and wheezing.   Cardiovascular: Negative for chest pain, palpitations and leg swelling.  Endocrine: Negative for cold intolerance, heat intolerance, polydipsia, polyphagia and polyuria.  Genitourinary: Negative for difficulty urinating, dysuria and frequency.  Musculoskeletal: Positive for back pain.  Neurological: Negative for dizziness, light-headedness, numbness and headaches.    History Past Medical History:  Diagnosis Date  . AAA (abdominal aortic aneurysm) (HCC)   . Allergic rhinitis   . Chronic hoarseness   . Colon polyps   . COPD (chronic obstructive pulmonary disease) (HCC)    Dr. Marchelle Gearing  . Dyslipidemia   . Hypertension   . Hypothyroidism   . MS (multiple sclerosis) (HCC)   . Obesity   . Stomach ulcer from aspirin/ibuprofen-like drugs (NSAID's)     She has a past surgical history that includes Cholecystectomy; Carpal tunnel release; Bariatric Surgery (09/07/08); Hiatal hernia repair (09/07/08); Abdominal hysterectomy; Cholecystectomy; Cataract extraction, bilateral; Cataract extraction (Right, 11/08/2017); Cataract extraction (Left, 11/22/2017); and LEFT HEART CATH AND CORONARY ANGIOGRAPHY (N/A, 07/09/2019).   Her family history includes AAA (abdominal aortic aneurysm) in her father and sister; Arthritis in her sister; COPD in her father and sister; Dementia in her sister; Heart disease in her father and mother; Other in her sister; Stroke (age of onset: 20) in her  mother.She reports that she quit smoking about 13 years ago. Her smoking use included cigarettes. She has a 80.00 pack-year smoking history. She has never used smokeless tobacco. She reports that she does not drink alcohol and does not use drugs.  Current Outpatient Medications on File Prior to Visit  Medication Sig Dispense Refill  . acetaminophen (TYLENOL) 500 MG tablet Take 1,000 mg by mouth every 6 (six) hours as needed for moderate pain or headache.    Marland Kitchen aspirin EC 81 MG tablet Take 1 tablet (81 mg total) by mouth daily. 90 tablet 3  . calcium carbonate (TUMS - DOSED IN MG ELEMENTAL CALCIUM) 500 MG chewable tablet Chew 2 tablets by mouth daily as needed for indigestion or heartburn.    . diphenhydramine-acetaminophen (TYLENOL PM) 25-500 MG TABS tablet Take 0.5-1 tablets by mouth at bedtime as needed (sleep).    . gabapentin (NEURONTIN) 300 MG capsule Take 600 mg by mouth every evening.     Marland Kitchen HYDROcodone-acetaminophen (NORCO/VICODIN) 5-325 MG per tablet Take 0.5 tablets by mouth 2 (two) times daily as needed for moderate pain.     . Interferon Beta-1b (BETASERON/EXTAVIA) 0.3 MG KIT injection Inject 0.25 mg into the skin every other day.    . levocetirizine (XYZAL) 5 MG tablet Take 1 tablet (5 mg total) by mouth every evening. 30 tablet 5  . levothyroxine (SYNTHROID) 112 MCG tablet TAKE 1 TABLET (112 MCG TOTAL) BY MOUTH DAILY BEFORE BREAKFAST. 90 tablet 0  . lidocaine (XYLOCAINE) 2 % solution Use as directed 15 mLs in the mouth or throat as needed for mouth pain (mix with warm water gargle and spit as needed for throat  pain). 100 mL 0  . mometasone (NASONEX) 50 MCG/ACT nasal spray Place 2 sprays into the nose as needed.    Bertram Gala Glycol-Propyl Glycol (SYSTANE ULTRA OP) Place 1 drop into the right eye daily as needed (dry eye).     Marland Kitchen sertraline (ZOLOFT) 50 MG tablet Take 1 tablet (50 mg total) by mouth daily. 90 tablet 1  . telmisartan-hydrochlorothiazide (MICARDIS HCT) 40-12.5 MG tablet Take  0.5 tablets by mouth daily. 45 tablet 3  . triamcinolone ointment (KENALOG) 0.1 % Apply 1 application topically every other day.   5  . atorvastatin (LIPITOR) 80 MG tablet Take 1 tablet (80 mg total) by mouth daily. 90 tablet 3   No current facility-administered medications on file prior to visit.     Objective:  Objective  Physical Exam Vitals and nursing note reviewed.  Constitutional:      Appearance: She is well-developed.  HENT:     Head: Normocephalic and atraumatic.  Eyes:     Conjunctiva/sclera: Conjunctivae normal.  Neck:     Thyroid: No thyromegaly.     Vascular: No carotid bruit or JVD.  Cardiovascular:     Rate and Rhythm: Normal rate and regular rhythm.     Heart sounds: Normal heart sounds. No murmur heard.   Pulmonary:     Effort: Pulmonary effort is normal. No respiratory distress.     Breath sounds: Normal breath sounds. No wheezing or rales.  Chest:     Chest wall: No tenderness.  Musculoskeletal:     Cervical back: Normal range of motion and neck supple.  Neurological:     Mental Status: She is alert and oriented to person, place, and time.    BP 124/82 (BP Location: Right Arm, Patient Position: Sitting, Cuff Size: Large)   Pulse 82   Temp 97.7 F (36.5 C) (Oral)   Resp 18   Ht 5\' 6"  (1.676 m)   Wt 247 lb 3.2 oz (112.1 kg)   BMI 39.90 kg/m  Wt Readings from Last 3 Encounters:  04/20/20 247 lb 3.2 oz (112.1 kg)  03/19/20 245 lb (111.1 kg)  03/12/20 245 lb 1.6 oz (111.2 kg)     Lab Results  Component Value Date   WBC 6.4 07/03/2019   HGB 14.1 07/03/2019   HCT 42.0 07/03/2019   PLT 143 (L) 07/03/2019   GLUCOSE 103 (H) 04/20/2020   CHOL 154 04/20/2020   TRIG 145.0 04/20/2020   HDL 48.80 04/20/2020   LDLCALC 77 04/20/2020   ALT 17 04/20/2020   AST 21 04/20/2020   NA 137 04/20/2020   K 4.8 04/20/2020   CL 102 04/20/2020   CREATININE 0.85 04/20/2020   BUN 13 04/20/2020   CO2 29 04/20/2020   TSH 3.63 04/20/2020   HGBA1C 5.9 09/25/2016     MICROALBUR 2.5 (H) 09/15/2014    No results found.   Assessment & Plan:  Plan  I have discontinued Tery Sanfilippo. Uhrich's traMADol and ALPRAZolam. I am also having her start on azelastine. Additionally, I am having her maintain her HYDROcodone-acetaminophen, triamcinolone ointment, gabapentin, Polyethyl Glycol-Propyl Glycol (SYSTANE ULTRA OP), aspirin EC, acetaminophen, diphenhydramine-acetaminophen, calcium carbonate, atorvastatin, Interferon Beta-1b, mometasone, telmisartan-hydrochlorothiazide, sertraline, levothyroxine, levocetirizine, and lidocaine.  Meds ordered this encounter  Medications  . azelastine (ASTELIN) 0.1 % nasal spray    Sig: Place 1 spray into both nostrils 2 (two) times daily. Use in each nostril as directed    Dispense:  30 mL    Refill:  12  Problem List Items Addressed This Visit      Unprioritized   AAA (abdominal aortic aneurysm) (HCC)    [facility] dr Randie Heinz       Chronic midline low back pain without sciatica    Per ortho      Depression with anxiety    Stable  con't meds Cont zoloft      HTN (hypertension) - Primary    Well controlled, no changes to meds. Encouraged heart healthy diet such as the DASH diet and exercise as tolerated.  con't micardis      Relevant Orders   Lipid panel (Completed)   Comprehensive metabolic panel (Completed)   TSH (Completed)   Hyperlipidemia    Tolerating statin, encouraged heart healthy diet, avoid trans fats, minimize simple carbs and saturated fats. Increase exercise as tolerated con't lipitor      Relevant Orders   Lipid panel (Completed)   Comprehensive metabolic panel (Completed)   TSH (Completed)   Hyperlipidemia LDL goal <100    Encouraged heart healthy diet, increase exercise, avoid trans fats, consider a krill oil cap daily      Hypothyroidism    Check labs  con't synthroid       MULTIPLE SCLEROSIS, RELAPSING/REMITTING    Per neuro        Other Visit Diagnoses    Need for  influenza vaccination       Relevant Orders   Flu Vaccine QUAD High Dose(Fluad) (Completed)   Allergy, initial encounter       Relevant Medications   azelastine (ASTELIN) 0.1 % nasal spray      Follow-up: Return in about 6 months (around 10/18/2020), or if symptoms worsen or fail to improve, for fasting, annual exam.  [name], DO    -

## 2020-04-20 NOTE — Assessment & Plan Note (Signed)
Encouraged heart healthy diet, increase exercise, avoid trans fats, consider a krill oil cap daily 

## 2020-04-20 NOTE — Patient Instructions (Signed)

## 2020-04-21 NOTE — Assessment & Plan Note (Signed)
Well controlled, no changes to meds. Encouraged heart healthy diet such as the DASH diet and exercise as tolerated.  con't micardis

## 2020-04-23 ENCOUNTER — Ambulatory Visit: Payer: PPO | Admitting: Family Medicine

## 2020-04-23 DIAGNOSIS — L292 Pruritus vulvae: Secondary | ICD-10-CM | POA: Diagnosis not present

## 2020-04-23 DIAGNOSIS — L900 Lichen sclerosus et atrophicus: Secondary | ICD-10-CM | POA: Diagnosis not present

## 2020-04-23 DIAGNOSIS — Z79899 Other long term (current) drug therapy: Secondary | ICD-10-CM | POA: Diagnosis not present

## 2020-05-01 IMAGING — CT CT CHEST LUNG CANCER SCREENING LOW DOSE W/O CM
1 of 2 series · 10 of 20 positions shown, 13 images · non-contrast
Comparison: 05/13/2018

CLINICAL DATA: 68-year-old female with 80 pack-year history of
smoking. Lung cancer screening.

EXAM:
CT CHEST WITHOUT CONTRAST LOW-DOSE FOR LUNG CANCER SCREENING
TECHNIQUE: Multidetector CT imaging of the chest was performed following the
standard protocol without IV contrast.

[ct lung segmentation data · axial · 0.77mm/px · z∈[-350,-350]mm · 10 of 283 frames shown]
[frame 1/283  mediastinal]
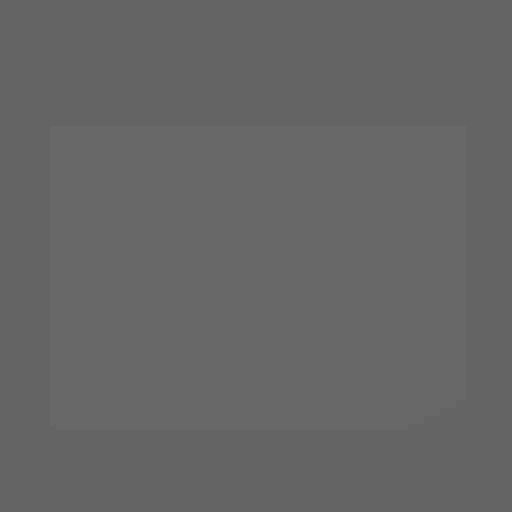
[frame 1/283  lung]
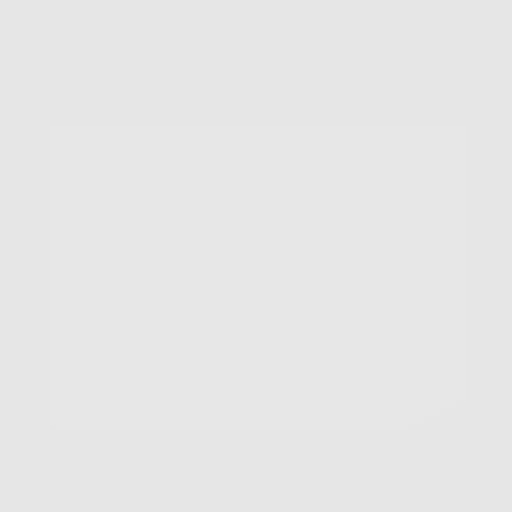
[frame 32/283  lung]
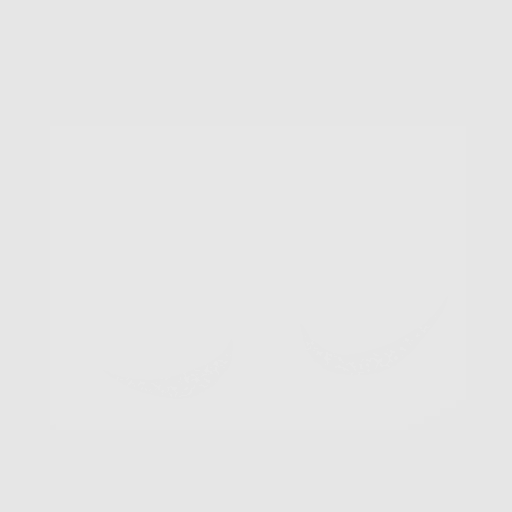
[frame 63/283  lung]
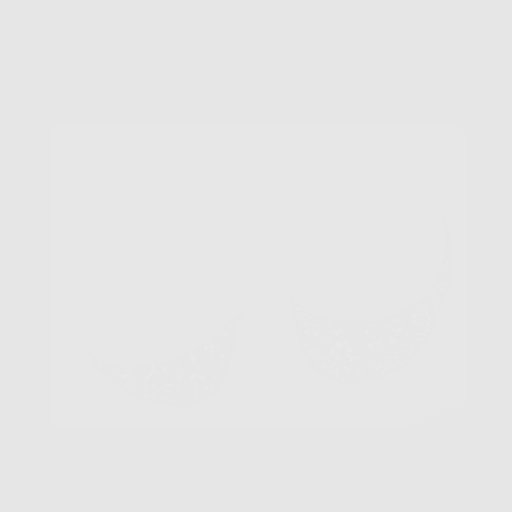
[frame 95/283  lung]
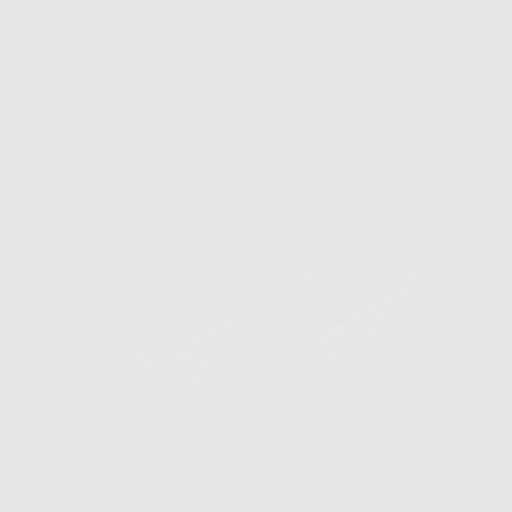
[frame 126/283  mediastinal]
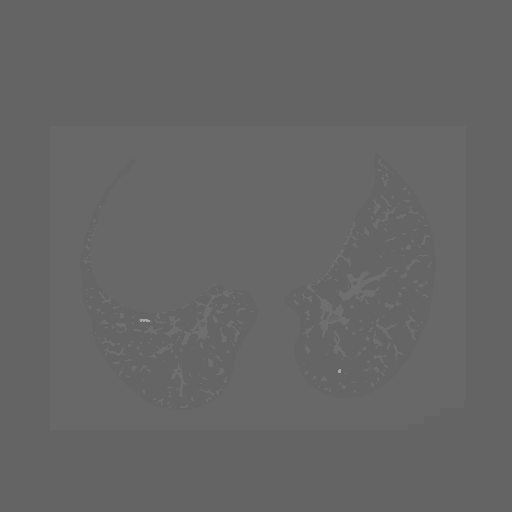
[frame 126/283  lung]
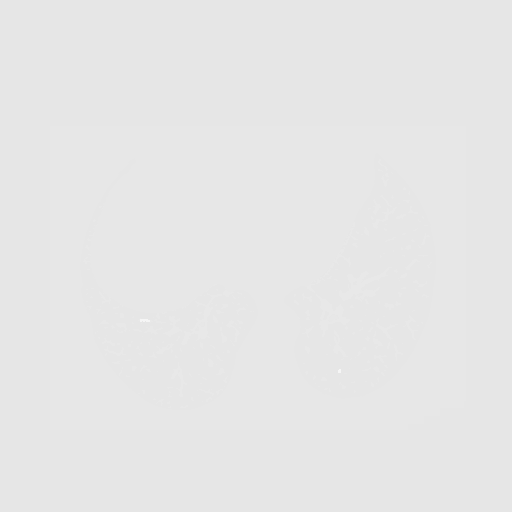
[frame 157/283  lung]
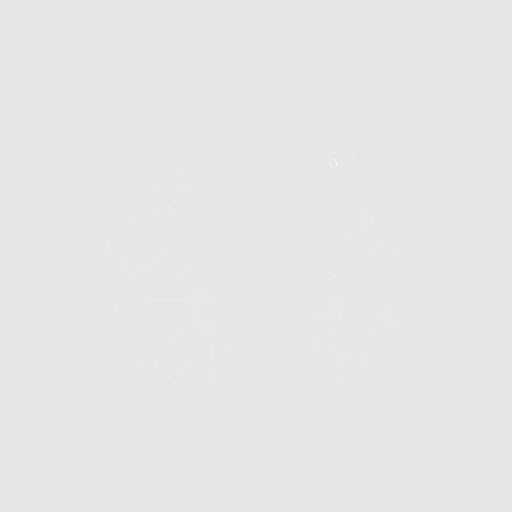
[frame 189/283  lung]
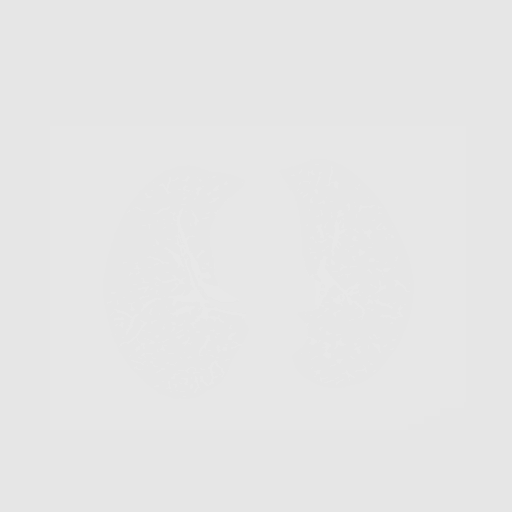
[frame 220/283  lung]
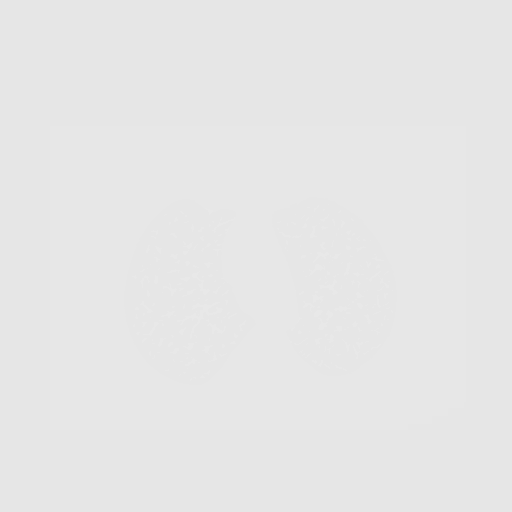
[frame 251/283  mediastinal]
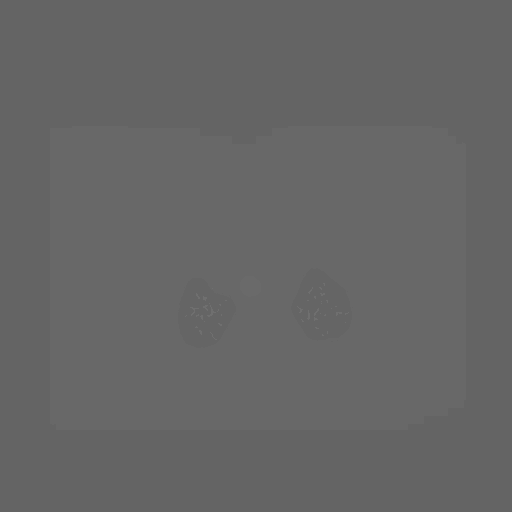
[frame 251/283  lung]
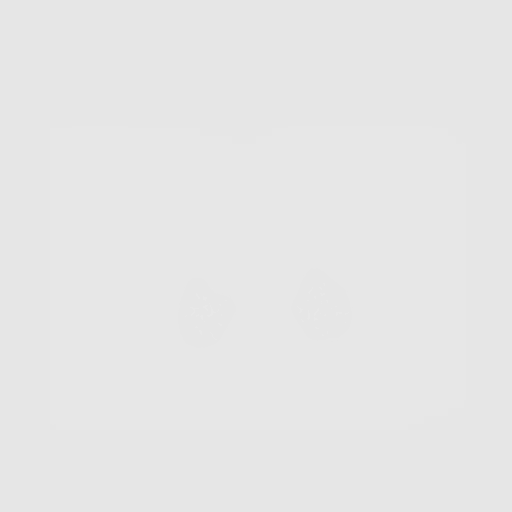
[frame 283/283  lung]
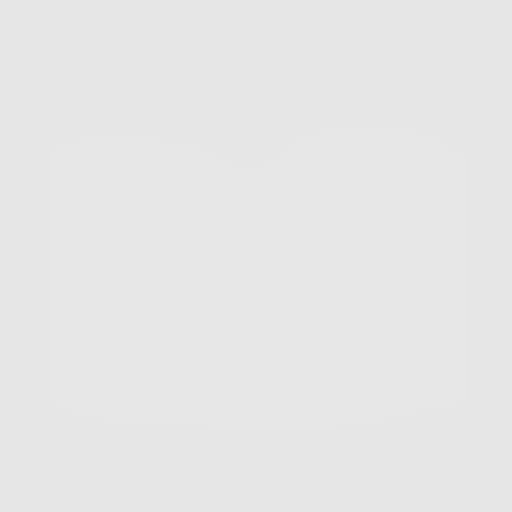

[10 of 20 positions shown; findings below may reference images not displayed]

FINDINGS: Cardiovascular: The heart size is normal. No substantial pericardial
effusion. Coronary artery calcification is evident. Atherosclerotic
calcification is noted in the wall of the thoracic aorta.

Mediastinum/Nodes: No mediastinal lymphadenopathy. No evidence for
gross hilar lymphadenopathy although assessment is limited by the
lack of intravenous contrast on today's study. The esophagus has
normal imaging features. There is no axillary lymphadenopathy.

Lungs/Pleura: Centrilobular emphsyema noted. Tiny pulmonary nodules
identified previously are stable. No new suspicious pulmonary nodule
or mass. No focal airspace consolidation. No pleural effusion.

Upper Abdomen: Status post lap band surgery.

Musculoskeletal: No worrisome lytic or sclerotic osseous
abnormality.
IMPRESSION: 1. Lung-RADS 2, benign appearance or behavior. Continue annual
screening with low-dose chest CT without contrast in 12 months.
2.  Emphysema. (3GML9-VKO.1)
3.  Aortic Atherosclerois (3GML9-170.0)

## 2020-05-06 ENCOUNTER — Other Ambulatory Visit: Payer: Self-pay | Admitting: Family Medicine

## 2020-05-14 DIAGNOSIS — M5136 Other intervertebral disc degeneration, lumbar region: Secondary | ICD-10-CM | POA: Diagnosis not present

## 2020-05-14 DIAGNOSIS — M5416 Radiculopathy, lumbar region: Secondary | ICD-10-CM | POA: Diagnosis not present

## 2020-05-19 ENCOUNTER — Encounter: Payer: Self-pay | Admitting: Adult Health

## 2020-05-19 ENCOUNTER — Ambulatory Visit: Payer: PPO | Admitting: Adult Health

## 2020-05-19 ENCOUNTER — Telehealth: Payer: Self-pay | Admitting: *Deleted

## 2020-05-19 VITALS — BP 124/78 | Ht 66.0 in | Wt 245.9 lb

## 2020-05-19 DIAGNOSIS — G35 Multiple sclerosis: Secondary | ICD-10-CM | POA: Diagnosis not present

## 2020-05-19 DIAGNOSIS — Z5181 Encounter for therapeutic drug level monitoring: Secondary | ICD-10-CM | POA: Diagnosis not present

## 2020-05-19 NOTE — Progress Notes (Signed)
PATIENT: Cassandra Rios DOB: 02-Mar-1951  REASON FOR VISIT: follow up HISTORY FROM: patient  HISTORY OF PRESENT ILLNESS: Today 05/19/20:  Cassandra Rios is a 69 year old female with a history of multiple sclerosis.  She returns today for follow-up.  She continues on Betaseron.  Reports that she tolerates the medication well other than some bruising at the injection site.  She denies any new numbness or weakness.  Denies any significant changes with her gait or balance.  States that she may get off balance with certain movements.  Denies any changes with the bowels or bladder.  Returns today for evaluation.  11/17/19: Cassandra Rios is a 69 year old female with a history of multiple sclerosis.  She returns today for follow-up.  She remains on Betaseron.  She denies any changes with her gait or balance.  No changes with her vision.  Denies any changes with the bowels or bladder.  She does report new tingling sensation in the lower extremities bilaterally.  She describes this as a pins and needle sensation.  Reports that the sensation comes and goes.  Seems to be worse when she sits down.  She does take gabapentin at bedtime and finds it helpful.  She denies being diabetic although her recent lab work shows elevated glucose level.  I do not see a recent hemoglobin A1c.  She returns today for an evaluation.  HISTORY 05/14/19:  Cassandra Rios is a 69 year old female with a history of multiple sclerosis.  She returns today for follow-up.  She remains on Betaseron.  She denies any new symptoms.  No new numbness or tingling.  No changes with her gait or balance.  No change with the bowels or bladder.  No changes with her vision.  She states that she has been having shortness of breath.  Her cardiologist has ordered a CT of the heart.  She denies any other symptoms.  She returns today for an evaluation.  REVIEW OF SYSTEMS: Out of a complete 14 system review of symptoms, the patient complains only of the  following symptoms, and all other reviewed systems are negative.  See HPI  ALLERGIES: Allergies  Allergen Reactions  . Codeine Itching and Nausea And Vomiting  . Hydromorphone Hcl Nausea And Vomiting  . Ibuprofen Other (See Comments)    stomach ulcers  . Morphine Sulfate     "makes my blood pressure bottom out"  . Metronidazole Hives, Nausea And Vomiting and Rash    HOME MEDICATIONS: Outpatient Medications Prior to Visit  Medication Sig Dispense Refill  . acetaminophen (TYLENOL) 500 MG tablet Take 1,000 mg by mouth every 6 (six) hours as needed for moderate pain or headache.    Marland Kitchen aspirin EC 81 MG tablet Take 1 tablet (81 mg total) by mouth daily. 90 tablet 3  . azelastine (ASTELIN) 0.1 % nasal spray Place 1 spray into both nostrils 2 (two) times daily. Use in each nostril as directed 30 mL 12  . calcium carbonate (TUMS - DOSED IN MG ELEMENTAL CALCIUM) 500 MG chewable tablet Chew 2 tablets by mouth daily as needed for indigestion or heartburn.    . clobetasol ointment (TEMOVATE) 0.05 % Apply 1 application topically 2 (two) times daily.    . diphenhydramine-acetaminophen (TYLENOL PM) 25-500 MG TABS tablet Take 0.5-1 tablets by mouth at bedtime as needed (sleep).    . gabapentin (NEURONTIN) 300 MG capsule Take 600 mg by mouth every evening.     Marland Kitchen HYDROcodone-acetaminophen (NORCO/VICODIN) 5-325 MG per tablet Take 0.5  tablets by mouth 2 (two) times daily as needed for moderate pain.     . Interferon Beta-1b (BETASERON/EXTAVIA) 0.3 MG KIT injection Inject 0.25 mg into the skin every other day.    . levocetirizine (XYZAL) 5 MG tablet Take 1 tablet (5 mg total) by mouth every evening. 30 tablet 5  . levothyroxine (SYNTHROID) 112 MCG tablet TAKE 1 TABLET (112 MCG TOTAL) BY MOUTH DAILY BEFORE BREAKFAST. 90 tablet 0  . mometasone (NASONEX) 50 MCG/ACT nasal spray Place 2 sprays into the nose as needed.    Bertram Gala Glycol-Propyl Glycol (SYSTANE ULTRA OP) Place 1 drop into the right eye daily as  needed (dry eye).     Marland Kitchen sertraline (ZOLOFT) 50 MG tablet Take 1 tablet (50 mg total) by mouth daily. 90 tablet 1  . telmisartan-hydrochlorothiazide (MICARDIS HCT) 40-12.5 MG tablet Take 0.5 tablets by mouth daily. 45 tablet 3  . atorvastatin (LIPITOR) 80 MG tablet Take 1 tablet (80 mg total) by mouth daily. 90 tablet 3  . lidocaine (XYLOCAINE) 2 % solution Use as directed 15 mLs in the mouth or throat as needed for mouth pain (mix with warm water gargle and spit as needed for throat pain). 100 mL 0  . triamcinolone ointment (KENALOG) 0.1 % Apply 1 application topically every other day.   5   No facility-administered medications prior to visit.    PAST MEDICAL HISTORY: Past Medical History:  Diagnosis Date  . AAA (abdominal aortic aneurysm) (HCC)   . Allergic rhinitis   . Chronic hoarseness   . Colon polyps   . COPD (chronic obstructive pulmonary disease) (HCC)    Dr. Marchelle Gearing  . Dyslipidemia   . Hypertension   . Hypothyroidism   . MS (multiple sclerosis) (HCC)   . Obesity   . Stomach ulcer from aspirin/ibuprofen-like drugs (NSAID's)     PAST SURGICAL HISTORY: Past Surgical History:  Procedure Laterality Date  . ABDOMINAL HYSTERECTOMY    . BARIATRIC SURGERY  09/07/08   lap band  . CARPAL TUNNEL RELEASE    . CATARACT EXTRACTION Right 11/08/2017  . CATARACT EXTRACTION Left 11/22/2017  . CATARACT EXTRACTION, BILATERAL      Toric lenses for astigmatism  . CHOLECYSTECTOMY    . CHOLECYSTECTOMY    . HIATAL HERNIA REPAIR  09/07/08  . LEFT HEART CATH AND CORONARY ANGIOGRAPHY N/A 07/09/2019   Procedure: LEFT HEART CATH AND CORONARY ANGIOGRAPHY;  Surgeon: Lyn Records, MD;  Location: [facility];  Service: Cardiovascular;  Laterality: N/A;    FAMILY HISTORY: Family History  Problem Relation Age of Onset  . COPD Father   . Heart disease Father   . AAA (abdominal aortic aneurysm) Father   . COPD Sister   . Arthritis Sister   . AAA (abdominal aortic aneurysm) Sister   .  Dementia Sister   . Other Sister        brain injury  . Stroke Mother 70  . Heart disease Mother        congenital heart defect?    SOCIAL HISTORY: Social History   Socioeconomic History  . Marital status: Married    Spouse name: Casimiro Needle  . Number of children: 2  . Years of education: 85  . Highest education level: Not on file  Occupational History  . Occupation: retired  Tobacco Use  . Smoking status: Former Smoker    Packs/day: 2.00    Years: 40.00    Pack years: 80.00    Types: Cigarettes  Quit date: 04/05/2007    Years since quitting: 13.1  . Smokeless tobacco: Never Used  Vaping Use  . Vaping Use: Not on file  Substance and Sexual Activity  . Alcohol use: No  . Drug use: No  . Sexual activity: Never    Partners: Male  Other Topics Concern  . Not on file  Social History Narrative   Patient is right handed,reside in home with husband   Social Determinants of Health   Financial Resource Strain: Not on file  Food Insecurity: Not on file  Transportation Needs: Not on file  Physical Activity: Not on file  Stress: Not on file  Social Connections: Not on file  Intimate Partner Violence: Not on file      PHYSICAL EXAM  Vitals:   05/19/20 0901  BP: 124/78  Weight: 245 lb 14.4 oz (111.5 kg)  Height: 5\' 6"  (1.676 m)   Body mass index is 39.69 kg/m.  Generalized: Well developed, in no acute distress   Neurological examination  Mentation: Alert oriented to time, place, history taking. Follows all commands speech and language fluent Cranial nerve II-XII: Pupils were equal round reactive to light.  Head turning and shoulder shrug  were normal and symmetric. Motor: The motor testing reveals 5 over 5 strength of all 4 extremities. Good symmetric motor tone is noted throughout.  Sensory: Sensory testing is intact to soft touch on all 4 extremities. No evidence of extinction is noted.  Coordination: Cerebellar testing reveals good finger-nose-finger and  heel-to-shin bilaterally.  Gait and station: Gait is normal.    DIAGNOSTIC DATA (LABS, IMAGING, TESTING) - I reviewed patient records, labs, notes, testing and imaging myself where available.  Lab Results  Component Value Date   WBC 6.4 07/03/2019   HGB 14.1 07/03/2019   HCT 42.0 07/03/2019   MCV 88 07/03/2019   PLT 143 (L) 07/03/2019      Component Value Date/Time   NA 137 04/20/2020 0953   NA 138 07/03/2019 1515   K 4.8 04/20/2020 0953   CL 102 04/20/2020 0953   CO2 29 04/20/2020 0953   GLUCOSE 103 (H) 04/20/2020 0953   BUN 13 04/20/2020 0953   BUN 12 07/03/2019 1515   CREATININE 0.85 04/20/2020 0953   CREATININE 1.04 05/13/2014 0840   CALCIUM 9.2 04/20/2020 0953   PROT 6.7 04/20/2020 0953   PROT 6.2 08/26/2019 0731   ALBUMIN 3.9 04/20/2020 0953   ALBUMIN 3.9 08/26/2019 0731   AST 21 04/20/2020 0953   ALT 17 04/20/2020 0953   ALKPHOS 81 04/20/2020 0953   BILITOT 0.6 04/20/2020 0953   BILITOT 0.3 08/26/2019 0731   GFRNONAA 58 (L) 07/03/2019 1515   GFRNONAA 57 (L) 05/13/2014 0840   GFRAA 67 07/03/2019 1515   GFRAA 66 05/13/2014 0840   Lab Results  Component Value Date   CHOL 154 04/20/2020   HDL 48.80 04/20/2020   LDLCALC 77 04/20/2020   TRIG 145.0 04/20/2020   CHOLHDL 3 04/20/2020   Lab Results  Component Value Date   HGBA1C 5.9 09/25/2016   Lab Results  Component Value Date   VITAMINB12 648 03/16/2010   Lab Results  Component Value Date   TSH 3.63 04/20/2020      ASSESSMENT AND PLAN 69 y.o. year old female  has a past medical history of AAA (abdominal aortic aneurysm) (HCC), Allergic rhinitis, Chronic hoarseness, Colon polyps, COPD (chronic obstructive pulmonary disease) (HCC), Dyslipidemia, Hypertension, Hypothyroidism, MS (multiple sclerosis) (HCC), Obesity, and Stomach ulcer from aspirin/ibuprofen-like drugs (  NSAID's). here with:  Multiple sclerosis   Continue Betaseron  Blood work--CBC  MRI of the brain was stable no need to repeat at  this visit  FU 6 months are sooner if needed  Advised patient if her symptoms worsen or she develops new symptoms she should let us know follow-up in 6 months or sooner if needed   I spent 20 minutes of face-to-face and non-face-to-face time with patient.  This included previsit chart review, lab review, study review, order entry, electronic health record documentation, patient education.  Butch Penny, MSN, NP-C 05/19/2020, 9:08 AM [facility] [address], [address] [place], [place] [address] [phone]

## 2020-05-19 NOTE — Patient Instructions (Signed)
Your Plan:  Continue betaseron  Blood work today If your symptoms worsen or you develop new symptoms please let us know.    Thank you for coming to see [name] at [facility]. I hope we have been able to provide you high quality care today.  You may receive a patient satisfaction survey over the next few weeks. We would appreciate your feedback and comments so that we may continue to improve ourselves and the health of our patients.

## 2020-05-19 NOTE — Telephone Encounter (Signed)
I faxed to [facility] [phone] for pt. She has copy.  I have copy if needed.

## 2020-05-20 LAB — CBC WITH DIFFERENTIAL/PLATELET
Basophils Absolute: 0 10*3/uL (ref 0.0–0.2)
Basos: 1 %
EOS (ABSOLUTE): 0.2 10*3/uL (ref 0.0–0.4)
Eos: 2 %
Hematocrit: 43.9 % (ref 34.0–46.6)
Hemoglobin: 14.7 g/dL (ref 11.1–15.9)
Immature Grans (Abs): 0 10*3/uL (ref 0.0–0.1)
Immature Granulocytes: 0 %
Lymphocytes Absolute: 2.4 10*3/uL (ref 0.7–3.1)
Lymphs: 37 %
MCH: 29.7 pg (ref 26.6–33.0)
MCHC: 33.5 g/dL (ref 31.5–35.7)
MCV: 89 fL (ref 79–97)
Monocytes Absolute: 0.6 10*3/uL (ref 0.1–0.9)
Monocytes: 9 %
Neutrophils Absolute: 3.3 10*3/uL (ref 1.4–7.0)
Neutrophils: 51 %
Platelets: 123 10*3/uL — ABNORMAL LOW (ref 150–450)
RBC: 4.95 x10E6/uL (ref 3.77–5.28)
RDW: 12.4 % (ref 11.7–15.4)
WBC: 6.5 10*3/uL (ref 3.4–10.8)

## 2020-05-21 ENCOUNTER — Other Ambulatory Visit: Payer: Self-pay

## 2020-05-21 ENCOUNTER — Ambulatory Visit (INDEPENDENT_AMBULATORY_CARE_PROVIDER_SITE_OTHER)
Admission: RE | Admit: 2020-05-21 | Discharge: 2020-05-21 | Disposition: A | Discharge status: Home / Self Care | Payer: PPO | Source: Ambulatory Visit | Attending: Internal Medicine | Admitting: Internal Medicine

## 2020-05-21 DIAGNOSIS — Z87891 Personal history of nicotine dependence: Secondary | ICD-10-CM

## 2020-06-03 ENCOUNTER — Other Ambulatory Visit: Payer: Self-pay | Admitting: Family Medicine

## 2020-06-03 DIAGNOSIS — F418 Other specified anxiety disorders: Secondary | ICD-10-CM

## 2020-06-03 NOTE — Progress Notes (Signed)

## 2020-06-08 ENCOUNTER — Other Ambulatory Visit: Payer: Self-pay | Admitting: *Deleted

## 2020-06-08 DIAGNOSIS — Z87891 Personal history of nicotine dependence: Secondary | ICD-10-CM

## 2020-06-10 ENCOUNTER — Ambulatory Visit
Admission: RE | Admit: 2020-06-10 | Discharge: 2020-06-10 | Disposition: A | Discharge status: Home / Self Care | Payer: PPO | Source: Ambulatory Visit | Attending: Family Medicine | Admitting: Family Medicine

## 2020-06-10 ENCOUNTER — Other Ambulatory Visit: Payer: Self-pay

## 2020-06-10 DIAGNOSIS — R922 Inconclusive mammogram: Secondary | ICD-10-CM | POA: Diagnosis not present

## 2020-06-10 DIAGNOSIS — R921 Mammographic calcification found on diagnostic imaging of breast: Secondary | ICD-10-CM | POA: Diagnosis not present

## 2020-06-10 DIAGNOSIS — R928 Other abnormal and inconclusive findings on diagnostic imaging of breast: Secondary | ICD-10-CM

## 2020-07-02 DIAGNOSIS — L900 Lichen sclerosus et atrophicus: Secondary | ICD-10-CM | POA: Diagnosis not present

## 2020-07-02 DIAGNOSIS — N3946 Mixed incontinence: Secondary | ICD-10-CM | POA: Diagnosis not present

## 2020-07-02 DIAGNOSIS — Z79899 Other long term (current) drug therapy: Secondary | ICD-10-CM | POA: Diagnosis not present

## 2020-07-02 DIAGNOSIS — N904 Leukoplakia of vulva: Secondary | ICD-10-CM | POA: Diagnosis not present

## 2020-07-07 DIAGNOSIS — N3946 Mixed incontinence: Secondary | ICD-10-CM | POA: Insufficient documentation

## 2020-07-07 NOTE — Telephone Encounter (Signed)
Received today [employer] [name] PAF (free drug) approval Case # [id number] [phone] 07-06-20 thru 06-04-21.

## 2020-07-08 ENCOUNTER — Other Ambulatory Visit: Payer: Self-pay | Admitting: Interventional Cardiology

## 2020-07-21 DIAGNOSIS — Z01812 Encounter for preprocedural laboratory examination: Secondary | ICD-10-CM | POA: Diagnosis not present

## 2020-07-26 ENCOUNTER — Encounter: Payer: Self-pay | Admitting: Family Medicine

## 2020-07-26 DIAGNOSIS — Z86010 Personal history of colonic polyps: Secondary | ICD-10-CM | POA: Diagnosis not present

## 2020-07-28 ENCOUNTER — Other Ambulatory Visit: Payer: Self-pay | Admitting: Family Medicine

## 2020-08-05 DIAGNOSIS — M5416 Radiculopathy, lumbar region: Secondary | ICD-10-CM | POA: Diagnosis not present

## 2020-09-02 ENCOUNTER — Other Ambulatory Visit: Payer: Self-pay | Admitting: Interventional Cardiology

## 2020-09-03 DIAGNOSIS — L304 Erythema intertrigo: Secondary | ICD-10-CM | POA: Diagnosis not present

## 2020-09-03 DIAGNOSIS — L900 Lichen sclerosus et atrophicus: Secondary | ICD-10-CM | POA: Diagnosis not present

## 2020-09-03 DIAGNOSIS — N3946 Mixed incontinence: Secondary | ICD-10-CM | POA: Diagnosis not present

## 2020-09-03 DIAGNOSIS — N904 Leukoplakia of vulva: Secondary | ICD-10-CM | POA: Diagnosis not present

## 2020-09-03 DIAGNOSIS — Z79899 Other long term (current) drug therapy: Secondary | ICD-10-CM | POA: Diagnosis not present

## 2020-09-14 DIAGNOSIS — M5416 Radiculopathy, lumbar region: Secondary | ICD-10-CM | POA: Diagnosis not present

## 2020-09-14 DIAGNOSIS — M545 Low back pain, unspecified: Secondary | ICD-10-CM | POA: Insufficient documentation

## 2020-09-14 DIAGNOSIS — M5136 Other intervertebral disc degeneration, lumbar region: Secondary | ICD-10-CM | POA: Diagnosis not present

## 2020-09-21 NOTE — Progress Notes (Signed)
Cardiology Office Note:    Date:  09/22/2020   ID:  Cassandra Rios, DOB 05-24-1951, MRN [id number]  PCP:  [name], DO  Cardiologist:  Lesleigh Noe, MD   Referring MD: Zola Button, Grayling Congress, *   Chief Complaint  Patient presents with  . Coronary Artery Disease  . Hyperlipidemia    History of Present Illness:    Cassandra Rios is a 70 y.o. female with a hx of AAA 4.3 cm, right bundle branch block,exertional dyspnea,chest discomfort, diaphoresis,three-vessel coronary calcification by CT 2019,coronary angiography after coronary CT demonstrating 70% LAD and circumflex lesionsFebruary 2021and near syncope.  Abdominal aortic aneurysm is followed by Dr. Lemar Livings.  4.3 cm most recently.  No chest pain.  Has chronic dyspnea on exertion.  She denies orthopnea lower extremity swelling.  She has not needed to use any sublingual nitroglycerin.  Relatively sedentary, most recently because of a flare of lower back discomfort.    Past Medical History:  Diagnosis Date  . AAA (abdominal aortic aneurysm) (HCC)   . Allergic rhinitis   . Chronic hoarseness   . Colon polyps   . COPD (chronic obstructive pulmonary disease) (HCC)    Dr. Marchelle Gearing  . Dyslipidemia   . Hypertension   . Hypothyroidism   . MS (multiple sclerosis) (HCC)   . Obesity   . Stomach ulcer from aspirin/ibuprofen-like drugs (NSAID's)     Past Surgical History:  Procedure Laterality Date  . ABDOMINAL HYSTERECTOMY    . BARIATRIC SURGERY  09/07/08   lap band  . CARPAL TUNNEL RELEASE    . CATARACT EXTRACTION Right 11/08/2017  . CATARACT EXTRACTION Left 11/22/2017  . CATARACT EXTRACTION, BILATERAL      Toric lenses for astigmatism  . CHOLECYSTECTOMY    . CHOLECYSTECTOMY    . HIATAL HERNIA REPAIR  09/07/08  . LEFT HEART CATH AND CORONARY ANGIOGRAPHY N/A 07/09/2019   Procedure: LEFT HEART CATH AND CORONARY ANGIOGRAPHY;  Surgeon: Lyn Records, MD;  Location: [facility];  Service:  Cardiovascular;  Laterality: N/A;    Current Medications: Current Meds  Medication Sig  . acetaminophen (TYLENOL) 500 MG tablet Take 1,000 mg by mouth every 6 (six) hours as needed for moderate pain or headache.  Marland Kitchen aspirin EC 81 MG tablet Take 1 tablet (81 mg total) by mouth daily.  Marland Kitchen atorvastatin (LIPITOR) 80 MG tablet TAKE 1 TABLET BY MOUTH EVERY DAY  . azelastine (ASTELIN) 0.1 % nasal spray Place 1 spray into both nostrils 2 (two) times daily. Use in each nostril as directed  . calcium carbonate (TUMS - DOSED IN MG ELEMENTAL CALCIUM) 500 MG chewable tablet Chew 2 tablets by mouth daily as needed for indigestion or heartburn.  . gabapentin (NEURONTIN) 300 MG capsule Take 600 mg by mouth every evening.   Marland Kitchen HYDROcodone-acetaminophen (NORCO/VICODIN) 5-325 MG per tablet Take 0.5 tablets by mouth 2 (two) times daily as needed for moderate pain.   . Interferon Beta-1b (BETASERON/EXTAVIA) 0.3 MG KIT injection Inject 0.25 mg into the skin every other day.  . levocetirizine (XYZAL) 5 MG tablet Take 1 tablet (5 mg total) by mouth every evening.  Marland Kitchen levothyroxine (SYNTHROID) 112 MCG tablet Take 1 tablet (112 mcg total) by mouth daily before breakfast.  . methocarbamol (ROBAXIN) 500 MG tablet methocarbamol 500 mg tablet  Take 1 tablet 3 times a day by oral route for 7 days.  . mometasone (NASONEX) 50 MCG/ACT nasal spray Place 2 sprays  into the nose as needed.  Bertram Gala Glycol-Propyl Glycol (SYSTANE ULTRA OP) Place 1 drop into the right eye daily as needed (dry eye).   Marland Kitchen sertraline (ZOLOFT) 50 MG tablet Take 1 tablet (50 mg total) by mouth daily.  . silver sulfADIAZINE (SILVADENE) 1 % cream Apply daily to irritated areas under abdomen  . telmisartan-hydrochlorothiazide (MICARDIS HCT) 40-12.5 MG tablet TAKE 1/2 TABLET BY MOUTH EVERY DAY  . traMADol (ULTRAM) 50 MG tablet 1 tablet as needed  . triamcinolone ointment (KENALOG) 0.1 % Apply daily to affected area     Allergies:   Codeine, Hydromorphone  hcl, Ibuprofen, Morphine sulfate, and Metronidazole   Social History   Socioeconomic History  . Marital status: Married    Spouse name: Casimiro Needle  . Number of children: 2  . Years of education: 40  . Highest education level: Not on file  Occupational History  . Occupation: retired  Tobacco Use  . Smoking status: Former Smoker    Packs/day: 2.00    Years: 40.00    Pack years: 80.00    Types: Cigarettes    Quit date: 04/05/2007    Years since quitting: 13.4  . Smokeless tobacco: Never Used  Vaping Use  . Vaping Use: Not on file  Substance and Sexual Activity  . Alcohol use: No  . Drug use: No  . Sexual activity: Never    Partners: Male  Other Topics Concern  . Not on file  Social History Narrative   Patient is right handed,reside in home with husband   Social Determinants of Health   Financial Resource Strain: Not on file  Food Insecurity: Not on file  Transportation Needs: Not on file  Physical Activity: Not on file  Stress: Not on file  Social Connections: Not on file     Family History: The patient's family history includes AAA (abdominal aortic aneurysm) in her father and sister; Arthritis in her sister; COPD in her father and sister; Dementia in her sister; Heart disease in her father and mother; Other in her sister; Stroke (age of onset: 46) in her mother.  ROS:   Please see the history of present illness.    Severe low back pain.  Coming off the steroid Dosepak.  Did not realize she had an abnormal EKG which shows right bundle branch block.  I discussed this in detail with her.  She injured her back while trying to carry away logs from a tree that had fallen on her deck all other systems reviewed and are negative.  EKGs/Labs/Other Studies Reviewed:    The following studies were reviewed today: CARDIAC CATH 07/09/2019: Diagnostic Dominance: Right  Diagnostic Dominance: Right AAA DUPLEX 03/12/2020: Summary:  Abdominal Aorta: There is evidence of abnormal  dilatation of the distal  Abdominal aorta. The largest aortic diameter has increased compared to  prior exam. Previous diameter measurement was 4.0 cm obtained on 06/11/18.    *See table(s) above for measurements and observations.    Electronically signed by Lemar Livings MD on 03/12/2020 at 11:26:33 AM.  EKG:  EKG right bundle branch block.  Sinus rhythm.  Normal PR interval.  Recent Labs: 04/20/2020: ALT 17; BUN 13; Creatinine, Ser 0.85; Potassium 4.8; Sodium 137; TSH 3.63 05/19/2020: Hemoglobin 14.7; Platelets 123  Recent Lipid Panel    Component Value Date/Time   CHOL 154 04/20/2020 0953   CHOL 130 08/26/2019 0731   TRIG 145.0 04/20/2020 0953   HDL 48.80 04/20/2020 0953   HDL 47 08/26/2019 0731  CHOLHDL 3 04/20/2020 0953   VLDL 29.0 04/20/2020 0953   LDLCALC 77 04/20/2020 0953   LDLCALC 65 08/26/2019 0731    Physical Exam:    VS:  BP 130/90   Pulse 79   Ht 5\' 6"  (1.676 m)   Wt 255 lb (115.7 kg)   SpO2 97%   BMI 41.16 kg/m     Wt Readings from Last 3 Encounters:  09/22/20 255 lb (115.7 kg)  05/19/20 245 lb 14.4 oz (111.5 kg)  04/20/20 247 lb 3.2 oz (112.1 kg)     GEN: Morbidly obese. No acute distress HEENT: Normal NECK: No JVD. LYMPHATICS: No lymphadenopathy CARDIAC: No murmur. RRR no gallop, or edema. VASCULAR:  Normal Pulses. No bruits. RESPIRATORY:  Clear to auscultation without rales, wheezing or rhonchi  ABDOMEN: Soft, non-tender, non-distended, No pulsatile mass, MUSCULOSKELETAL: No deformity  SKIN: Warm and dry NEUROLOGIC:  Alert and oriented x 3 PSYCHIATRIC:  Normal affect   ASSESSMENT:    1. Coronary artery disease of native artery of native heart with stable angina pectoris (HCC)   2. Hyperlipidemia LDL goal <100   3. Essential hypertension   4. Right bundle branch block   5. AAA (abdominal aortic aneurysm) without rupture (HCC)    PLAN:    In order of problems listed above:  1. Secondary prevention discussed.  Has not needed sublingual  nitroglycerin. 2. LDL target is less than 70.  Most recently in November 2021 it was 10.  Continue tor 80 mg/day. 3. Low-salt diet.  Target blood pressure 130/80.  Slightly elevated today because of prednisone being used concomitantly. 4. Discussed right bundle branch block 5. Being monitored by Dr. Lemar Livings.  Overall education and awareness concerning primary/secondary risk prevention was discussed in detail: LDL less than 70, hemoglobin A1c less than 7, blood pressure target less than 130/80 mmHg, >150 minutes of moderate aerobic activity per week, avoidance of smoking, weight control (via diet and exercise), and continued surveillance/management of/for obstructive sleep apnea.    Medication Adjustments/Labs and Tests Ordered: Current medicines are reviewed at length with the patient today.  Concerns regarding medicines are outlined above.  Orders Placed This Encounter  Procedures  . EKG 12-Lead   No orders of the defined types were placed in this encounter.   Patient Instructions  Medication Instructions:  Your physician recommends that you continue on your current medications as directed. Please refer to the Current Medication list given to you today.  *If you need a refill on your cardiac medications before your next appointment, please call your pharmacy*   Lab Work: None If you have labs (blood work) drawn today and your tests are completely normal, you will receive your results only by: Marland Kitchen MyChart Message (if you have MyChart) OR . A paper copy in the mail If you have any lab test that is abnormal or we need to change your treatment, we will call you to review the results.   Testing/Procedures: None   Follow-Up: At [facility], you and your health needs are our priority.  As part of our continuing mission to provide you with exceptional heart care, we have created designated Provider Care Teams.  These Care Teams include your primary Cardiologist (physician) and  Advanced Practice Providers (APPs -  Physician Assistants and Nurse Practitioners) who all work together to provide you with the care you need, when you need it.  We recommend signing up for the patient portal called "MyChart".  Sign up information is provided on  this After Visit Summary.  MyChart is used to connect with patients for Virtual Visits (Telemedicine).  Patients are able to view lab/test results, encounter notes, upcoming appointments, etc.  Non-urgent messages can be sent to your provider as well.   To learn more about what you can do with MyChart, go to [email or web address].    Your next appointment:   1 year(s)  The format for your next appointment:   In Person  Provider:   You may see Lesleigh Noe, MD or one of the following Advanced Practice Providers on your designated Care Team:    Georgie Chard, NP    Other Instructions      Signed, Lesleigh Noe, MD  09/22/2020 11:16 AM    [facility]

## 2020-09-22 ENCOUNTER — Ambulatory Visit: Payer: PPO | Admitting: Interventional Cardiology

## 2020-09-22 ENCOUNTER — Encounter: Payer: Self-pay | Admitting: Interventional Cardiology

## 2020-09-22 ENCOUNTER — Other Ambulatory Visit: Payer: Self-pay

## 2020-09-22 VITALS — BP 130/90 | HR 79 | Ht 66.0 in | Wt 255.0 lb

## 2020-09-22 DIAGNOSIS — E785 Hyperlipidemia, unspecified: Secondary | ICD-10-CM

## 2020-09-22 DIAGNOSIS — I4510 Unspecified right bundle-branch block: Secondary | ICD-10-CM | POA: Diagnosis not present

## 2020-09-22 DIAGNOSIS — I714 Abdominal aortic aneurysm, without rupture, unspecified: Secondary | ICD-10-CM

## 2020-09-22 DIAGNOSIS — I10 Essential (primary) hypertension: Secondary | ICD-10-CM | POA: Diagnosis not present

## 2020-09-22 DIAGNOSIS — I25118 Atherosclerotic heart disease of native coronary artery with other forms of angina pectoris: Secondary | ICD-10-CM

## 2020-09-22 NOTE — Patient Instructions (Signed)

## 2020-09-28 DIAGNOSIS — Z79891 Long term (current) use of opiate analgesic: Secondary | ICD-10-CM | POA: Diagnosis not present

## 2020-09-28 DIAGNOSIS — M5459 Other low back pain: Secondary | ICD-10-CM | POA: Diagnosis not present

## 2020-10-09 ENCOUNTER — Other Ambulatory Visit: Payer: Self-pay | Admitting: Family Medicine

## 2020-10-09 DIAGNOSIS — J069 Acute upper respiratory infection, unspecified: Secondary | ICD-10-CM

## 2020-10-19 ENCOUNTER — Encounter: Payer: PPO | Admitting: Family Medicine

## 2020-10-22 DIAGNOSIS — M545 Low back pain, unspecified: Secondary | ICD-10-CM | POA: Diagnosis not present

## 2020-11-05 DIAGNOSIS — M47816 Spondylosis without myelopathy or radiculopathy, lumbar region: Secondary | ICD-10-CM | POA: Diagnosis not present

## 2020-11-05 DIAGNOSIS — M47817 Spondylosis without myelopathy or radiculopathy, lumbosacral region: Secondary | ICD-10-CM | POA: Insufficient documentation

## 2020-11-15 ENCOUNTER — Other Ambulatory Visit: Payer: Self-pay

## 2020-11-16 ENCOUNTER — Encounter: Payer: Self-pay | Admitting: Family Medicine

## 2020-11-16 ENCOUNTER — Ambulatory Visit (INDEPENDENT_AMBULATORY_CARE_PROVIDER_SITE_OTHER): Payer: PPO | Admitting: Family Medicine

## 2020-11-16 VITALS — BP 134/68 | HR 56 | Temp 98.0°F | Resp 20 | Ht 66.0 in | Wt 254.2 lb

## 2020-11-16 DIAGNOSIS — M545 Low back pain, unspecified: Secondary | ICD-10-CM

## 2020-11-16 DIAGNOSIS — J439 Emphysema, unspecified: Secondary | ICD-10-CM

## 2020-11-16 DIAGNOSIS — Z0000 Encounter for general adult medical examination without abnormal findings: Secondary | ICD-10-CM

## 2020-11-16 DIAGNOSIS — J449 Chronic obstructive pulmonary disease, unspecified: Secondary | ICD-10-CM | POA: Insufficient documentation

## 2020-11-16 DIAGNOSIS — I25118 Atherosclerotic heart disease of native coronary artery with other forms of angina pectoris: Secondary | ICD-10-CM | POA: Diagnosis not present

## 2020-11-16 DIAGNOSIS — J440 Chronic obstructive pulmonary disease with acute lower respiratory infection: Secondary | ICD-10-CM | POA: Diagnosis not present

## 2020-11-16 DIAGNOSIS — E785 Hyperlipidemia, unspecified: Secondary | ICD-10-CM

## 2020-11-16 DIAGNOSIS — G8929 Other chronic pain: Secondary | ICD-10-CM

## 2020-11-16 DIAGNOSIS — G35 Multiple sclerosis: Secondary | ICD-10-CM | POA: Diagnosis not present

## 2020-11-16 DIAGNOSIS — I714 Abdominal aortic aneurysm, without rupture, unspecified: Secondary | ICD-10-CM

## 2020-11-16 DIAGNOSIS — F418 Other specified anxiety disorders: Secondary | ICD-10-CM

## 2020-11-16 DIAGNOSIS — J209 Acute bronchitis, unspecified: Secondary | ICD-10-CM

## 2020-11-16 DIAGNOSIS — E039 Hypothyroidism, unspecified: Secondary | ICD-10-CM

## 2020-11-16 DIAGNOSIS — D696 Thrombocytopenia, unspecified: Secondary | ICD-10-CM | POA: Diagnosis not present

## 2020-11-16 DIAGNOSIS — I10 Essential (primary) hypertension: Secondary | ICD-10-CM

## 2020-11-16 LAB — TSH: TSH: 1.21 u[IU]/mL (ref 0.35–4.50)

## 2020-11-16 LAB — COMPREHENSIVE METABOLIC PANEL
ALT: 19 U/L (ref 0–35)
AST: 22 U/L (ref 0–37)
Albumin: 4 g/dL (ref 3.5–5.2)
Alkaline Phosphatase: 73 U/L (ref 39–117)
BUN: 12 mg/dL (ref 6–23)
CO2: 29 mEq/L (ref 19–32)
Calcium: 9.4 mg/dL (ref 8.4–10.5)
Chloride: 102 mEq/L (ref 96–112)
Creatinine, Ser: 0.85 mg/dL (ref 0.40–1.20)
GFR: 69.24 mL/min (ref >60.00)
Glucose, Bld: 106 mg/dL — ABNORMAL HIGH (ref 70–99)
Potassium: 4.7 mEq/L (ref 3.5–5.1)
Sodium: 138 mEq/L (ref 135–145)
Total Bilirubin: 0.6 mg/dL (ref 0.2–1.2)
Total Protein: 6.6 g/dL (ref 6.0–8.3)

## 2020-11-16 LAB — CBC WITH DIFFERENTIAL/PLATELET
Basophils Absolute: 0 10*3/uL (ref 0.0–0.1)
Basophils Relative: 0.7 % (ref 0.0–3.0)
Eosinophils Absolute: 0.1 10*3/uL (ref 0.0–0.7)
Eosinophils Relative: 1.7 % (ref 0.0–5.0)
HCT: 42.2 % (ref 36.0–46.0)
Hemoglobin: 14.1 g/dL (ref 12.0–15.0)
Lymphocytes Relative: 29.8 % (ref 12.0–46.0)
Lymphs Abs: 1.7 10*3/uL (ref 0.7–4.0)
MCHC: 33.3 g/dL (ref 30.0–36.0)
MCV: 89.8 fl (ref 78.0–100.0)
Monocytes Absolute: 0.5 10*3/uL (ref 0.1–1.0)
Monocytes Relative: 9.3 % (ref 3.0–12.0)
Neutro Abs: 3.4 10*3/uL (ref 1.4–7.7)
Neutrophils Relative %: 58.5 % (ref 43.0–77.0)
Platelets: 131 10*3/uL — ABNORMAL LOW (ref 150.0–400.0)
RBC: 4.7 Mil/uL (ref 3.87–5.11)
RDW: 13.9 % (ref 11.5–15.5)
WBC: 5.8 10*3/uL (ref 4.0–10.5)

## 2020-11-16 LAB — LIPID PANEL
Cholesterol: 137 mg/dL (ref 0–200)
HDL: 39.8 mg/dL (ref >39.00)
LDL Cholesterol: 71 mg/dL (ref 0–99)
NonHDL: 96.74
Total CHOL/HDL Ratio: 3
Triglycerides: 130 mg/dL (ref 0.0–149.0)
VLDL: 26 mg/dL (ref 0.0–40.0)

## 2020-11-16 NOTE — Assessment & Plan Note (Signed)
Per pulm stable

## 2020-11-16 NOTE — Patient Instructions (Signed)
https://www.nhlbi.nih.gov/files/docs/public/heart/dash_brief.pdf">  DASH Eating Plan DASH stands for Dietary Approaches to Stop Hypertension. The DASH eating plan is a healthy eating plan that has been shown to: Reduce high blood pressure (hypertension). Reduce your risk for type 2 diabetes, heart disease, and stroke. Help with weight loss. What are tips for following this plan? Reading food labels Check food labels for the amount of salt (sodium) per serving. Choose foods with less than 5 percent of the Daily Value of sodium. Generally, foods with less than 300 milligrams (mg) of sodium per serving fit into this eating plan. To find whole grains, look for the word "whole" as the first word in the ingredient list. Shopping Buy products labeled as "low-sodium" or "no salt added." Buy fresh foods. Avoid canned foods and pre-made or frozen meals. Cooking Avoid adding salt when cooking. Use salt-free seasonings or herbs instead of table salt or sea salt. Check with your health care provider or pharmacist before using salt substitutes. Do not fry foods. Cook foods using healthy methods such as baking, boiling, grilling, roasting, and broiling instead. Cook with heart-healthy oils, such as olive, canola, avocado, soybean, or sunflower oil. Meal planning  Eat a balanced diet that includes: 4 or more servings of fruits and 4 or more servings of vegetables each day. Try to fill one-half of your plate with fruits and vegetables. 6-8 servings of whole grains each day. Less than 6 oz (170 g) of lean meat, poultry, or fish each day. A 3-oz (85-g) serving of meat is about the same size as a deck of cards. One egg equals 1 oz (28 g). 2-3 servings of low-fat dairy each day. One serving is 1 cup (237 mL). 1 serving of nuts, seeds, or beans 5 times each week. 2-3 servings of heart-healthy fats. Healthy fats called omega-3 fatty acids are found in foods such as walnuts, flaxseeds, fortified milks, and eggs.  These fats are also found in cold-water fish, such as sardines, salmon, and mackerel. Limit how much you eat of: Canned or prepackaged foods. Food that is high in trans fat, such as some fried foods. Food that is high in saturated fat, such as fatty meat. Desserts and other sweets, sugary drinks, and other foods with added sugar. Full-fat dairy products. Do not salt foods before eating. Do not eat more than 4 egg yolks a week. Try to eat at least 2 vegetarian meals a week. Eat more home-cooked food and less restaurant, buffet, and fast food.  Lifestyle When eating at a restaurant, ask that your food be prepared with less salt or no salt, if possible. If you drink alcohol: Limit how much you use to: 0-1 drink a day for women who are not pregnant. 0-2 drinks a day for men. Be aware of how much alcohol is in your drink. In the [place] one drink equals one 12 oz bottle of beer (355 mL), one 5 oz glass of wine (148 mL), or one 1 oz glass of hard liquor (44 mL). General information Avoid eating more than 2,300 mg of salt a day. If you have hypertension, you may need to reduce your sodium intake to 1,500 mg a day. Work with your health care provider to maintain a healthy body weight or to lose weight. Ask what an ideal weight is for you. Get at least 30 minutes of exercise that causes your heart to beat faster (aerobic exercise) most days of the week. Activities may include walking, swimming, or biking. Work with your health care provider   or dietitian to adjust your eating plan to your individual calorie needs. What foods should I eat? Fruits All fresh, dried, or frozen fruit. Canned fruit in natural juice (without addedsugar). Vegetables Fresh or frozen vegetables (raw, steamed, roasted, or grilled). Low-sodium or reduced-sodium tomato and vegetable juice. Low-sodium or reduced-sodium tomatosauce and tomato paste. Low-sodium or reduced-sodium canned vegetables. Grains Whole-grain or  whole-wheat bread. Whole-grain or whole-wheat pasta. Brown rice. Oatmeal. Quinoa. Bulgur. Whole-grain and low-sodium cereals. Pita bread.Low-fat, low-sodium crackers. Whole-wheat flour tortillas. Meats and other proteins Skinless chicken or turkey. Ground chicken or turkey. Pork with fat trimmed off. Fish and seafood. Egg whites. Dried beans, peas, or lentils. Unsalted nuts, nut butters, and seeds. Unsalted canned beans. Lean cuts of beef with fat trimmed off. Low-sodium, lean precooked or cured meat, such as sausages or meatloaves. Dairy Low-fat (1%) or fat-free (skim) milk. Reduced-fat, low-fat, or fat-free cheeses. Nonfat, low-sodium ricotta or cottage cheese. Low-fat or nonfatyogurt. Low-fat, low-sodium cheese. Fats and oils Soft margarine without trans fats. Vegetable oil. Reduced-fat, low-fat, or light mayonnaise and salad dressings (reduced-sodium). Canola, safflower, olive, avocado, soybean, andsunflower oils. Avocado. Seasonings and condiments Herbs. Spices. Seasoning mixes without salt. Other foods Unsalted popcorn and pretzels. Fat-free sweets. The items listed above may not be a complete list of foods and beverages you can eat. Contact a dietitian for more information. What foods should I avoid? Fruits Canned fruit in a light or heavy syrup. Fried fruit. Fruit in cream or buttersauce. Vegetables Creamed or fried vegetables. Vegetables in a cheese sauce. Regular canned vegetables (not low-sodium or reduced-sodium). Regular canned tomato sauce and paste (not low-sodium or reduced-sodium). Regular tomato and vegetable juice(not low-sodium or reduced-sodium). Pickles. Olives. Grains Baked goods made with fat, such as croissants, muffins, or some breads. Drypasta or rice meal packs. Meats and other proteins Fatty cuts of meat. Ribs. Fried meat. Bacon. Bologna, salami, and other precooked or cured meats, such as sausages or meat loaves. Fat from the back of a pig (fatback). Bratwurst.  Salted nuts and seeds. Canned beans with added salt. Canned orsmoked fish. Whole eggs or egg yolks. Chicken or turkey with skin. Dairy Whole or 2% milk, cream, and half-and-half. Whole or full-fat cream cheese. Whole-fat or sweetened yogurt. Full-fat cheese. Nondairy creamers. Whippedtoppings. Processed cheese and cheese spreads. Fats and oils Butter. Stick margarine. Lard. Shortening. Ghee. Bacon fat. Tropical oils, suchas coconut, palm kernel, or palm oil. Seasonings and condiments Onion salt, garlic salt, seasoned salt, table salt, and sea salt. Worcestershire sauce. Tartar sauce. Barbecue sauce. Teriyaki sauce. Soy sauce, including reduced-sodium. Steak sauce. Canned and packaged gravies. Fish sauce. Oyster sauce. Cocktail sauce. Store-bought horseradish. Ketchup. Mustard. Meat flavorings and tenderizers. Bouillon cubes. Hot sauces. Pre-made or packaged marinades. Pre-made or packaged taco seasonings. Relishes. Regular saladdressings. Other foods Salted popcorn and pretzels. The items listed above may not be a complete list of foods and beverages you should avoid. Contact a dietitian for more information. Where to find more information National Heart, Lung, and Blood Institute: www.nhlbi.nih.gov American Heart Association: www.heart.org Academy of Nutrition and Dietetics: www.eatright.org National Kidney Foundation: www.kidney.org Summary The DASH eating plan is a healthy eating plan that has been shown to reduce high blood pressure (hypertension). It may also reduce your risk for type 2 diabetes, heart disease, and stroke. When on the DASH eating plan, aim to eat more fresh fruits and vegetables, whole grains, lean proteins, low-fat dairy, and heart-healthy fats. With the DASH eating plan, you should limit salt (sodium) intake to 2,300   mg a day. If you have hypertension, you may need to reduce your sodium intake to 1,500 mg a day. Work with your health care provider or dietitian to adjust  your eating plan to your individual calorie needs. This information is not intended to replace advice given to you by your health care provider. Make sure you discuss any questions you have with your healthcare provider. Document Revised: 04/25/2019 Document Reviewed: 04/25/2019 Elsevier Patient Education  2022 Elsevier Inc.  

## 2020-11-16 NOTE — Assessment & Plan Note (Signed)
Check labs 

## 2020-11-16 NOTE — Assessment & Plan Note (Signed)
Check labs today F/u cardilogy

## 2020-11-16 NOTE — Progress Notes (Signed)
Subjective:   By signing my name below, I, [name], attest that this documentation has been prepared under the direction and in the presence of Dr. Seabron Spates, DO. 11/16/2020    Patient ID: Cassandra Rios, female    DOB: 1951-04-07, 70 y.o.   MRN: [id number]  Chief Complaint  Patient presents with   Annual Exam    Pt states fasting     HPI Patient is in today for a comprehensive physical exam.  She complains of back pain. She has a history of arthritis. 1 month ago while helping her daughter move a fallen tree, she hurt her back while trying to help. The pain did not start until the next week. She went to a clinic the week the pain started and found that her pelvic bones were inflamed and was prescribed prednisone to manage it. She also takes 0.5x 5-325 mg hydrocodone Po as needed to manage her back pain as needed. She does not take tramadol to manage her pain. She has constipation while taking hydrocodone. Her other provider, Dr. Sheran Luz, is managing her issue. She is planning to get injections in her back to manage the pain next week. She is using a walker at this time. She continues seeing Dr. Lemar Livings to manage her aneurysm.  She has no change in her depression or anxiety symptoms. She continues taking 50 mg Zoloft daily PO.  She denies having any fever, ear pain, congestion, sinus pain, sore throat, eye pain, chest pain, leg swelling, palpations, cough, SOB, wheezing, n/v/d, blood in stool, dysuria, frequency, hematuria, or headaches at this time. She has an upcoming vision appointment later this summer. She has stopped smoking. She is not interested in getting the shingles vaccine.   Past Medical History:  Diagnosis Date   AAA (abdominal aortic aneurysm) (HCC)    Allergic rhinitis    Chronic hoarseness    Colon polyps    COPD (chronic obstructive pulmonary disease) (HCC)    Dr. Marchelle Gearing   Dyslipidemia    Hypertension    Hypothyroidism    MS  (multiple sclerosis) (HCC)    Obesity    Stomach ulcer from aspirin/ibuprofen-like drugs (NSAID's)     Past Surgical History:  Procedure Laterality Date   ABDOMINAL HYSTERECTOMY     BARIATRIC SURGERY  09/07/08   lap band   CARPAL TUNNEL RELEASE     CATARACT EXTRACTION Right 11/08/2017   CATARACT EXTRACTION Left 11/22/2017   CATARACT EXTRACTION, BILATERAL      Toric lenses for astigmatism   CHOLECYSTECTOMY     CHOLECYSTECTOMY     HIATAL HERNIA REPAIR  09/07/08   LEFT HEART CATH AND CORONARY ANGIOGRAPHY N/A 07/09/2019   Procedure: LEFT HEART CATH AND CORONARY ANGIOGRAPHY;  Surgeon: Lyn Records, MD;  Location: [facility];  Service: Cardiovascular;  Laterality: N/A;    Family History  Problem Relation Age of Onset   COPD Father    Heart disease Father    AAA (abdominal aortic aneurysm) Father    COPD Sister    Arthritis Sister    AAA (abdominal aortic aneurysm) Sister    Dementia Sister    Other Sister        brain injury   Stroke Mother 49   Heart disease Mother        congenital heart defect?    Social History   Socioeconomic History   Marital status: Married    Spouse name: Casimiro Needle   Number of  children: 2   Years of education: 16   Highest education level: Not on file  Occupational History   Occupation: retired  Tobacco Use   Smoking status: Former    Packs/day: 2.00    Years: 40.00    Pack years: 80.00    Types: Cigarettes    Quit date: 04/05/2007    Years since quitting: 13.6   Smokeless tobacco: Never  Vaping Use   Vaping Use: Not on file  Substance and Sexual Activity   Alcohol use: No   Drug use: No   Sexual activity: Never    Partners: Male  Other Topics Concern   Not on file  Social History Narrative   Patient is right handed,reside in home with husband   Social Determinants of Health   Financial Resource Strain: Not on file  Food Insecurity: Not on file  Transportation Needs: Not on file  Physical Activity: Not on file  Stress:  Not on file  Social Connections: Not on file  Intimate Partner Violence: Not on file    Outpatient Medications Prior to Visit  Medication Sig Dispense Refill   acetaminophen (TYLENOL) 500 MG tablet Take 1,000 mg by mouth every 6 (six) hours as needed for moderate pain or headache.     aspirin EC 81 MG tablet Take 1 tablet (81 mg total) by mouth daily. 90 tablet 3   atorvastatin (LIPITOR) 80 MG tablet TAKE 1 TABLET BY MOUTH EVERY DAY 90 tablet 3   calcium carbonate (TUMS - DOSED IN MG ELEMENTAL CALCIUM) 500 MG chewable tablet Chew 2 tablets by mouth daily as needed for indigestion or heartburn.     gabapentin (NEURONTIN) 300 MG capsule Take 600 mg by mouth every evening.      HYDROcodone-acetaminophen (NORCO/VICODIN) 5-325 MG per tablet Take 0.5 tablets by mouth 2 (two) times daily as needed for moderate pain.      Interferon Beta-1b (BETASERON/EXTAVIA) 0.3 MG KIT injection Inject 0.25 mg into the skin every other day.     levocetirizine (XYZAL) 5 MG tablet Take 1 tablet (5 mg total) by mouth every evening. 30 tablet 5   levothyroxine (SYNTHROID) 112 MCG tablet Take 1 tablet (112 mcg total) by mouth daily before breakfast. 90 tablet 1   mometasone (NASONEX) 50 MCG/ACT nasal spray Place 2 sprays into the nose as needed.     Polyethyl Glycol-Propyl Glycol (SYSTANE ULTRA OP) Place 1 drop into the right eye daily as needed (dry eye).      sertraline (ZOLOFT) 50 MG tablet Take 1 tablet (50 mg total) by mouth daily. 90 tablet 1   silver sulfADIAZINE (SILVADENE) 1 % cream Apply daily to irritated areas under abdomen     telmisartan-hydrochlorothiazide (MICARDIS HCT) 40-12.5 MG tablet TAKE 1/2 TABLET BY MOUTH EVERY DAY 45 tablet 1   triamcinolone ointment (KENALOG) 0.1 % Apply daily to affected area     azelastine (ASTELIN) 0.1 % nasal spray Place 1 spray into both nostrils 2 (two) times daily. Use in each nostril as directed (Patient not taking: Reported on 11/16/2020) 30 mL 12   methocarbamol  (ROBAXIN) 500 MG tablet methocarbamol 500 mg tablet  Take 1 tablet 3 times a day by oral route for 7 days. (Patient not taking: Reported on 11/16/2020)     traMADol (ULTRAM) 50 MG tablet 1 tablet as needed (Patient not taking: Reported on 11/16/2020)     No facility-administered medications prior to visit.    Allergies  Allergen Reactions   Codeine Itching and  Nausea And Vomiting   Hydromorphone Hcl Nausea And Vomiting   Ibuprofen Other (See Comments)    stomach ulcers   Morphine Sulfate     "makes my blood pressure bottom out"   Metronidazole Hives, Nausea And Vomiting and Rash    Review of Systems  Constitutional:  Negative for chills, fever and malaise/fatigue.  HENT:  Negative for congestion, ear pain, hearing loss, sinus pain and sore throat.   Eyes:  Negative for pain and discharge.  Respiratory:  Negative for cough, sputum production, shortness of breath and wheezing.   Cardiovascular:  Negative for chest pain, palpitations and leg swelling.  Gastrointestinal:  Positive for constipation. Negative for abdominal pain, blood in stool, diarrhea, heartburn, nausea and vomiting.  Genitourinary:  Negative for dysuria, frequency, hematuria and urgency.  Musculoskeletal:  Positive for back pain. Negative for falls and myalgias.  Skin:  Negative for rash.  Neurological:  Negative for dizziness, sensory change, loss of consciousness, weakness and headaches.  Endo/Heme/Allergies:  Negative for environmental allergies. Does not bruise/bleed easily.  Psychiatric/Behavioral:  Negative for depression and suicidal ideas. The patient is not nervous/anxious and does not have insomnia.       Objective:    Physical Exam Vitals and nursing note reviewed.  Constitutional:      General: She is not in acute distress.    Appearance: Normal appearance. She is well-developed. She is not ill-appearing.  HENT:     Head: Normocephalic and atraumatic.     Right Ear: Tympanic membrane and external ear  normal.     Left Ear: Tympanic membrane and external ear normal.     Nose: Nose normal.  Eyes:     Extraocular Movements: Extraocular movements intact.     Pupils: Pupils are equal, round, and reactive to light.  Cardiovascular:     Rate and Rhythm: Normal rate and regular rhythm.     Pulses: Normal pulses.     Heart sounds: Normal heart sounds. No murmur heard.   No gallop.  Pulmonary:     Effort: Pulmonary effort is normal. No respiratory distress.     Breath sounds: Normal breath sounds. No wheezing, rhonchi or rales.  Chest:     Chest wall: No tenderness.  Abdominal:     General: Bowel sounds are normal. There is no distension.     Palpations: Abdomen is soft. There is no mass.     Tenderness: There is no abdominal tenderness. There is no guarding or rebound.     Hernia: No hernia is present.  Musculoskeletal:     Cervical back: Normal range of motion and neck supple.  Skin:    General: Skin is warm and dry.  Neurological:     Mental Status: She is alert and oriented to person, place, and time.  Psychiatric:        Behavior: Behavior normal.        Thought Content: Thought content normal.        Judgment: Judgment normal.    BP 134/68 (BP Location: Right Arm, Patient Position: Sitting, Cuff Size: Large)   Pulse (!) 56   Temp 98 F (36.7 C) (Oral)   Resp 20   Ht 5\' 6"  (1.676 m)   Wt 254 lb 3.2 oz (115.3 kg)   SpO2 97%   BMI 41.03 kg/m  Wt Readings from Last 3 Encounters:  11/16/20 254 lb 3.2 oz (115.3 kg)  09/22/20 255 lb (115.7 kg)  05/19/20 245 lb 14.4 oz (111.5 kg)  Diabetic Foot Exam - Simple   No data filed    Lab Results  Component Value Date   WBC 6.5 05/19/2020   HGB 14.7 05/19/2020   HCT 43.9 05/19/2020   PLT 123 (L) 05/19/2020   GLUCOSE 103 (H) 04/20/2020   CHOL 154 04/20/2020   TRIG 145.0 04/20/2020   HDL 48.80 04/20/2020   LDLCALC 77 04/20/2020   ALT 17 04/20/2020   AST 21 04/20/2020   NA 137 04/20/2020   K 4.8 04/20/2020   CL 102  04/20/2020   CREATININE 0.85 04/20/2020   BUN 13 04/20/2020   CO2 29 04/20/2020   TSH 3.63 04/20/2020   HGBA1C 5.9 09/25/2016   MICROALBUR 2.5 (H) 09/15/2014    Lab Results  Component Value Date   TSH 3.63 04/20/2020   Lab Results  Component Value Date   WBC 6.5 05/19/2020   HGB 14.7 05/19/2020   HCT 43.9 05/19/2020   MCV 89 05/19/2020   PLT 123 (L) 05/19/2020   Lab Results  Component Value Date   NA 137 04/20/2020   K 4.8 04/20/2020   CO2 29 04/20/2020   GLUCOSE 103 (H) 04/20/2020   BUN 13 04/20/2020   CREATININE 0.85 04/20/2020   BILITOT 0.6 04/20/2020   ALKPHOS 81 04/20/2020   AST 21 04/20/2020   ALT 17 04/20/2020   PROT 6.7 04/20/2020   ALBUMIN 3.9 04/20/2020   CALCIUM 9.2 04/20/2020   ANIONGAP 7 12/30/2017   GFR 69.52 04/20/2020   Lab Results  Component Value Date   CHOL 154 04/20/2020   Lab Results  Component Value Date   HDL 48.80 04/20/2020   Lab Results  Component Value Date   LDLCALC 77 04/20/2020   Lab Results  Component Value Date   TRIG 145.0 04/20/2020   Lab Results  Component Value Date   CHOLHDL 3 04/20/2020   Lab Results  Component Value Date   HGBA1C 5.9 09/25/2016   Mammogram- Last completed 06/10/2020. Results showed stable calcifications in upper-outer quadrant of the right breast, stability over 2 years considered benign. Otherwise results are normal. Repeat in 1 year. Dexa- Last completed 06/13/2017. Results showed osteoporotic. Repeat in 2 years.  Colonoscopy- Last completed March, 2022. Repeat in 5 years.     Assessment & Plan:   Problem List Items Addressed This Visit       Unprioritized   COPD with acute bronchitis (HCC)   MORBID OBESITY   AAA (abdominal aortic aneurysm) (HCC)    F/u Dr Randie Heinz annually  Last check 4.2 cm       CAD (coronary artery disease), native coronary artery    Check labs today F/u cardilogy       Chronic midline low back pain without sciatica    Per dr Ethelene Hal       COPD (chronic  obstructive pulmonary disease) (HCC)    Per pulm stable       Depression with anxiety    Stable with meds con't zoloft       HTN (hypertension)    Well controlled, no changes to meds. Encouraged heart healthy diet such as the DASH diet and exercise as tolerated.        Relevant Orders   TSH   Lipid panel   Comprehensive metabolic panel   CBC with Differential/Platelet   Hyperlipidemia    Tolerating statin, encouraged heart healthy diet, avoid trans fats, minimize simple carbs and saturated fats. Increase exercise as tolerated  Relevant Orders   TSH   Lipid panel   Comprehensive metabolic panel   CBC with Differential/Platelet   Hypothyroidism - Primary    Check labs today stable       Relevant Orders   TSH   MULTIPLE SCLEROSIS, RELAPSING/REMITTING    Stable Per neuro        Preventative health care    ghm utd Check labs  See AVS       THROMBOCYTOPENIA    Check labs           No orders of the defined types were placed in this encounter.   I, Dr. Seabron Spates, DO, personally preformed the services described in this documentation.  All medical record entries made by the scribe were at my direction and in my presence.  I have reviewed the chart and discharge instructions (if applicable) and agree that the record reflects my personal performance and is accurate and complete. 11/16/2020   I,[name],acting as a Neurosurgeon for [employer], DO.,have documented all relevant documentation on the behalf of [name], DO,as directed by  [name], DO while in the presence of [name], DO.   [name], DO

## 2020-11-16 NOTE — Assessment & Plan Note (Signed)
Per dr Nelva Bush

## 2020-11-16 NOTE — Assessment & Plan Note (Signed)
Stable with meds con't zoloft

## 2020-11-16 NOTE — Assessment & Plan Note (Signed)
Tolerating statin, encouraged heart healthy diet, avoid trans fats, minimize simple carbs and saturated fats. Increase exercise as tolerated 

## 2020-11-16 NOTE — Assessment & Plan Note (Signed)
ghm utd Check labs See AVS 

## 2020-11-16 NOTE — Assessment & Plan Note (Signed)
Well controlled, no changes to meds. Encouraged heart healthy diet such as the DASH diet and exercise as tolerated.  °

## 2020-11-16 NOTE — Assessment & Plan Note (Signed)
Check labs today stable

## 2020-11-16 NOTE — Assessment & Plan Note (Signed)
Stable Per neuro 

## 2020-11-16 NOTE — Assessment & Plan Note (Signed)
F/u Dr Donzetta Matters annually  Last check 4.2 cm

## 2020-11-22 ENCOUNTER — Other Ambulatory Visit: Payer: Self-pay | Admitting: Family Medicine

## 2020-11-22 ENCOUNTER — Encounter: Payer: Self-pay | Admitting: Adult Health

## 2020-11-22 ENCOUNTER — Ambulatory Visit: Payer: PPO | Admitting: Adult Health

## 2020-11-22 VITALS — BP 124/76 | HR 60 | Ht 66.0 in | Wt 253.4 lb

## 2020-11-22 DIAGNOSIS — D691 Qualitative platelet defects: Secondary | ICD-10-CM

## 2020-11-22 DIAGNOSIS — G35D Multiple sclerosis, unspecified: Secondary | ICD-10-CM

## 2020-11-22 DIAGNOSIS — G35 Multiple sclerosis: Secondary | ICD-10-CM | POA: Diagnosis not present

## 2020-11-22 NOTE — Progress Notes (Signed)
PATIENT: Cassandra Rios DOB: 1951-03-27  REASON FOR VISIT: follow up HISTORY FROM: patient Primary neurologist: Dr. Anne Hahn  HISTORY OF PRESENT ILLNESS: Today 11/22/20:  Cassandra Rios is a 70 year old female with a history of Multiple sclerosis. She returns today for follow-up.  Overall the patient feels that she is doing well.  She remains on Betaseron.  Denies any new numbness or weakness.  Patient states that she does use a walker but this is due to back pain.  Denies any changes with the bowels or bladder.  She does have some issues with constipation due to her pain medication.  No changes with her vision.  She continues to have low back pain and will be receiving injections by Dr. Ethelene Hal on Thursday  05/19/20: Cassandra Rios is a 70 year old female with a history of multiple sclerosis.  She returns today for follow-up.  She continues on Betaseron.  Reports that she tolerates the medication well other than some bruising at the injection site.  She denies any new numbness or weakness.  Denies any significant changes with her gait or balance.  States that she may get off balance with certain movements.  Denies any changes with the bowels or bladder.  Returns today for evaluation.  11/17/19: Cassandra Rios is a 70 year old female with a history of multiple sclerosis.  She returns today for follow-up.  She remains on Betaseron.  She denies any changes with her gait or balance.  No changes with her vision.  Denies any changes with the bowels or bladder.  She does report new tingling sensation in the lower extremities bilaterally.  She describes this as a pins and needle sensation.  Reports that the sensation comes and goes.  Seems to be worse when she sits down.  She does take gabapentin at bedtime and finds it helpful.  She denies being diabetic although her recent lab work shows elevated glucose level.  I do not see a recent hemoglobin A1c.  She returns today for an evaluation.  HISTORY 05/14/19:    Cassandra Rios is a 70 year old female with a history of multiple sclerosis.  She returns today for follow-up.  She remains on Betaseron.  She denies any new symptoms.  No new numbness or tingling.  No changes with her gait or balance.  No change with the bowels or bladder.  No changes with her vision.  She states that she has been having shortness of breath.  Her cardiologist has ordered a CT of the heart.  She denies any other symptoms.  She returns today for an evaluation.  REVIEW OF SYSTEMS: Out of a complete 14 system review of symptoms, the patient complains only of the following symptoms, and all other reviewed systems are negative.  See HPI  ALLERGIES: Allergies  Allergen Reactions   Codeine Itching and Nausea And Vomiting   Hydromorphone Hcl Nausea And Vomiting   Ibuprofen Other (See Comments)    stomach ulcers   Morphine Sulfate     "makes my blood pressure bottom out"   Metronidazole Hives, Nausea And Vomiting and Rash    HOME MEDICATIONS: Outpatient Medications Prior to Visit  Medication Sig Dispense Refill   acetaminophen (TYLENOL) 500 MG tablet Take 1,000 mg by mouth every 6 (six) hours as needed for moderate pain or headache.     aspirin EC 81 MG tablet Take 1 tablet (81 mg total) by mouth daily. 90 tablet 3   atorvastatin (LIPITOR) 80 MG tablet TAKE 1 TABLET BY  PATIENT: Cortney Beissel DOB: 19-Apr-1951  REASON FOR VISIT: follow up HISTORY FROM: patient Primary neurologist: Dr. [name]  HISTORY OF PRESENT ILLNESS: Today 11/22/20:  Cassandra Rios is a 70 year old female with a history of Multiple sclerosis. She returns today for follow-up.  Overall the patient feels that she is doing well.  She remains on Betaseron.  Denies any new numbness or weakness.  Patient states that she does use a walker but this is due to back pain.  Denies any changes with the bowels or bladder.  She does have some issues with constipation due to her pain medication.  No changes with her vision.  She continues to have low back pain and will be receiving injections by Dr. Nelva Bush on Thursday  05/19/20: Cassandra Rios is a 70 year old female with a history of multiple sclerosis.  She returns today for follow-up.  She continues on Betaseron.  Reports that she tolerates the medication well other than some bruising at the injection site.  She denies any new numbness or weakness.  Denies any significant changes with her gait or balance.  States that she may get off balance with certain movements.  Denies any changes with the bowels or bladder.  Returns today for evaluation.  11/17/19: Cassandra Rios is a 70 year old female with a history of multiple sclerosis.  She returns today for follow-up.  She remains on Betaseron.  She denies any changes with her gait or balance.  No changes with her vision.  Denies any changes with the bowels or bladder.  She does report new tingling sensation in the lower extremities bilaterally.  She describes this as a pins and needle sensation.  Reports that the sensation comes and goes.  Seems to be worse when she sits down.  She does take gabapentin at bedtime and finds it helpful.  She denies being diabetic although her recent lab work shows elevated glucose level.  I do not see a recent hemoglobin A1c.  She returns today for an evaluation.  HISTORY 05/14/19:    Cassandra Rios is a 70 year old female with a history of multiple sclerosis.  She returns today for follow-up.  She remains on Betaseron.  She denies any new symptoms.  No new numbness or tingling.  No changes with her gait or balance.  No change with the bowels or bladder.  No changes with her vision.  She states that she has been having shortness of breath.  Her cardiologist has ordered a CT of the heart.  She denies any other symptoms.  She returns today for an evaluation.  REVIEW OF SYSTEMS: Out of a complete 14 system review of symptoms, the patient complains only of the following symptoms, and all other reviewed systems are negative.  See HPI  ALLERGIES: Allergies  Allergen Reactions   Codeine Itching and Nausea And Vomiting   Hydromorphone Hcl Nausea And Vomiting   Ibuprofen Other (See Comments)    stomach ulcers   Morphine Sulfate     "makes my blood pressure bottom out"   Metronidazole Hives, Nausea And Vomiting and Rash    HOME MEDICATIONS: Outpatient Medications Prior to Visit  Medication Sig Dispense Refill   acetaminophen (TYLENOL) 500 MG tablet Take 1,000 mg by mouth every 6 (six) hours as needed for moderate pain or headache.     aspirin EC 81 MG tablet Take 1 tablet (81 mg total) by mouth daily. 90 tablet 3   atorvastatin (LIPITOR) 80 MG tablet TAKE 1 TABLET BY  PATIENT: Cortney Beissel DOB: 19-Apr-1951  REASON FOR VISIT: follow up HISTORY FROM: patient Primary neurologist: Dr. [name]  HISTORY OF PRESENT ILLNESS: Today 11/22/20:  Cassandra Rios is a 70 year old female with a history of Multiple sclerosis. She returns today for follow-up.  Overall the patient feels that she is doing well.  She remains on Betaseron.  Denies any new numbness or weakness.  Patient states that she does use a walker but this is due to back pain.  Denies any changes with the bowels or bladder.  She does have some issues with constipation due to her pain medication.  No changes with her vision.  She continues to have low back pain and will be receiving injections by Dr. Nelva Bush on Thursday  05/19/20: Cassandra Rios is a 70 year old female with a history of multiple sclerosis.  She returns today for follow-up.  She continues on Betaseron.  Reports that she tolerates the medication well other than some bruising at the injection site.  She denies any new numbness or weakness.  Denies any significant changes with her gait or balance.  States that she may get off balance with certain movements.  Denies any changes with the bowels or bladder.  Returns today for evaluation.  11/17/19: Cassandra Rios is a 70 year old female with a history of multiple sclerosis.  She returns today for follow-up.  She remains on Betaseron.  She denies any changes with her gait or balance.  No changes with her vision.  Denies any changes with the bowels or bladder.  She does report new tingling sensation in the lower extremities bilaterally.  She describes this as a pins and needle sensation.  Reports that the sensation comes and goes.  Seems to be worse when she sits down.  She does take gabapentin at bedtime and finds it helpful.  She denies being diabetic although her recent lab work shows elevated glucose level.  I do not see a recent hemoglobin A1c.  She returns today for an evaluation.  HISTORY 05/14/19:    Cassandra Rios is a 70 year old female with a history of multiple sclerosis.  She returns today for follow-up.  She remains on Betaseron.  She denies any new symptoms.  No new numbness or tingling.  No changes with her gait or balance.  No change with the bowels or bladder.  No changes with her vision.  She states that she has been having shortness of breath.  Her cardiologist has ordered a CT of the heart.  She denies any other symptoms.  She returns today for an evaluation.  REVIEW OF SYSTEMS: Out of a complete 14 system review of symptoms, the patient complains only of the following symptoms, and all other reviewed systems are negative.  See HPI  ALLERGIES: Allergies  Allergen Reactions   Codeine Itching and Nausea And Vomiting   Hydromorphone Hcl Nausea And Vomiting   Ibuprofen Other (See Comments)    stomach ulcers   Morphine Sulfate     "makes my blood pressure bottom out"   Metronidazole Hives, Nausea And Vomiting and Rash    HOME MEDICATIONS: Outpatient Medications Prior to Visit  Medication Sig Dispense Refill   acetaminophen (TYLENOL) 500 MG tablet Take 1,000 mg by mouth every 6 (six) hours as needed for moderate pain or headache.     aspirin EC 81 MG tablet Take 1 tablet (81 mg total) by mouth daily. 90 tablet 3   atorvastatin (LIPITOR) 80 MG tablet TAKE 1 TABLET BY  PATIENT: Cortney Beissel DOB: 19-Apr-1951  REASON FOR VISIT: follow up HISTORY FROM: patient Primary neurologist: Dr. [name]  HISTORY OF PRESENT ILLNESS: Today 11/22/20:  Cassandra Rios is a 70 year old female with a history of Multiple sclerosis. She returns today for follow-up.  Overall the patient feels that she is doing well.  She remains on Betaseron.  Denies any new numbness or weakness.  Patient states that she does use a walker but this is due to back pain.  Denies any changes with the bowels or bladder.  She does have some issues with constipation due to her pain medication.  No changes with her vision.  She continues to have low back pain and will be receiving injections by Dr. Nelva Bush on Thursday  05/19/20: Cassandra Rios is a 70 year old female with a history of multiple sclerosis.  She returns today for follow-up.  She continues on Betaseron.  Reports that she tolerates the medication well other than some bruising at the injection site.  She denies any new numbness or weakness.  Denies any significant changes with her gait or balance.  States that she may get off balance with certain movements.  Denies any changes with the bowels or bladder.  Returns today for evaluation.  11/17/19: Cassandra Rios is a 70 year old female with a history of multiple sclerosis.  She returns today for follow-up.  She remains on Betaseron.  She denies any changes with her gait or balance.  No changes with her vision.  Denies any changes with the bowels or bladder.  She does report new tingling sensation in the lower extremities bilaterally.  She describes this as a pins and needle sensation.  Reports that the sensation comes and goes.  Seems to be worse when she sits down.  She does take gabapentin at bedtime and finds it helpful.  She denies being diabetic although her recent lab work shows elevated glucose level.  I do not see a recent hemoglobin A1c.  She returns today for an evaluation.  HISTORY 05/14/19:    Cassandra Rios is a 70 year old female with a history of multiple sclerosis.  She returns today for follow-up.  She remains on Betaseron.  She denies any new symptoms.  No new numbness or tingling.  No changes with her gait or balance.  No change with the bowels or bladder.  No changes with her vision.  She states that she has been having shortness of breath.  Her cardiologist has ordered a CT of the heart.  She denies any other symptoms.  She returns today for an evaluation.  REVIEW OF SYSTEMS: Out of a complete 14 system review of symptoms, the patient complains only of the following symptoms, and all other reviewed systems are negative.  See HPI  ALLERGIES: Allergies  Allergen Reactions   Codeine Itching and Nausea And Vomiting   Hydromorphone Hcl Nausea And Vomiting   Ibuprofen Other (See Comments)    stomach ulcers   Morphine Sulfate     "makes my blood pressure bottom out"   Metronidazole Hives, Nausea And Vomiting and Rash    HOME MEDICATIONS: Outpatient Medications Prior to Visit  Medication Sig Dispense Refill   acetaminophen (TYLENOL) 500 MG tablet Take 1,000 mg by mouth every 6 (six) hours as needed for moderate pain or headache.     aspirin EC 81 MG tablet Take 1 tablet (81 mg total) by mouth daily. 90 tablet 3   atorvastatin (LIPITOR) 80 MG tablet TAKE 1 TABLET BY

## 2020-11-22 NOTE — Patient Instructions (Signed)
Your Plan:  Continue Betaseron  MRI at the next visit If your symptoms worsen or you develop new symptoms please let us know.   Thank you for coming to see [name] at [facility]. I hope we have been able to provide you high quality care today.  You may receive a patient satisfaction survey over the next few weeks. We would appreciate your feedback and comments so that we may continue to improve ourselves and the health of our patients.

## 2020-11-22 NOTE — Progress Notes (Signed)
I have read the note, and I agree with the clinical assessment and plan.  Raianna Slight K Audrick Lamoureaux   

## 2020-11-25 DIAGNOSIS — M47816 Spondylosis without myelopathy or radiculopathy, lumbar region: Secondary | ICD-10-CM | POA: Diagnosis not present

## 2020-11-30 ENCOUNTER — Other Ambulatory Visit: Payer: Self-pay | Admitting: Family Medicine

## 2020-11-30 DIAGNOSIS — F418 Other specified anxiety disorders: Secondary | ICD-10-CM

## 2020-12-03 DIAGNOSIS — Z79899 Other long term (current) drug therapy: Secondary | ICD-10-CM | POA: Diagnosis not present

## 2020-12-03 DIAGNOSIS — L900 Lichen sclerosus et atrophicus: Secondary | ICD-10-CM | POA: Diagnosis not present

## 2020-12-03 DIAGNOSIS — N904 Leukoplakia of vulva: Secondary | ICD-10-CM | POA: Diagnosis not present

## 2020-12-03 DIAGNOSIS — L292 Pruritus vulvae: Secondary | ICD-10-CM | POA: Diagnosis not present

## 2020-12-27 DIAGNOSIS — M5416 Radiculopathy, lumbar region: Secondary | ICD-10-CM | POA: Diagnosis not present

## 2020-12-27 DIAGNOSIS — Z79891 Long term (current) use of opiate analgesic: Secondary | ICD-10-CM | POA: Diagnosis not present

## 2020-12-27 DIAGNOSIS — M47816 Spondylosis without myelopathy or radiculopathy, lumbar region: Secondary | ICD-10-CM | POA: Diagnosis not present

## 2021-01-25 ENCOUNTER — Other Ambulatory Visit: Payer: Self-pay | Admitting: Family Medicine

## 2021-02-01 DIAGNOSIS — H04123 Dry eye syndrome of bilateral lacrimal glands: Secondary | ICD-10-CM | POA: Diagnosis not present

## 2021-02-01 DIAGNOSIS — H43813 Vitreous degeneration, bilateral: Secondary | ICD-10-CM | POA: Diagnosis not present

## 2021-02-01 DIAGNOSIS — H26491 Other secondary cataract, right eye: Secondary | ICD-10-CM | POA: Diagnosis not present

## 2021-02-03 DIAGNOSIS — M47816 Spondylosis without myelopathy or radiculopathy, lumbar region: Secondary | ICD-10-CM | POA: Diagnosis not present

## 2021-02-15 DIAGNOSIS — M47816 Spondylosis without myelopathy or radiculopathy, lumbar region: Secondary | ICD-10-CM | POA: Diagnosis not present

## 2021-02-22 ENCOUNTER — Encounter: Payer: Self-pay | Admitting: Family Medicine

## 2021-02-22 ENCOUNTER — Other Ambulatory Visit: Payer: Self-pay

## 2021-02-22 ENCOUNTER — Telehealth (INDEPENDENT_AMBULATORY_CARE_PROVIDER_SITE_OTHER): Payer: PPO | Admitting: Family Medicine

## 2021-02-22 VITALS — Temp 101.0°F

## 2021-02-22 DIAGNOSIS — U071 COVID-19: Secondary | ICD-10-CM

## 2021-02-22 MED ORDER — MOLNUPIRAVIR EUA 200MG CAPSULE: 4.0000 | ORAL_CAPSULE | Freq: Two times a day (BID) | ORAL | 0 refills | Status: AC

## 2021-02-22 NOTE — Patient Instructions (Signed)
COVID-19: Quarantine and Isolation Quarantine If you were exposed Quarantine and stay away from others when you have been in close contact with someone who has COVID-19. Isolate If you are sick or test positive Isolate when you are sick or when you have COVID-19, even if you don't have symptoms. When to stay home Calculating quarantine The date of your exposure is considered day 0. Day 1 is the first full day after your last contact with a person who has had COVID-19. Stay home and away from other people for at least 5 days. Learn why CDC updated guidance for the general public. IF YOU were exposed to COVID-19 and are NOT  up to dateIF YOU were exposed to COVID-19 and are NOT on COVID-19 vaccinations Quarantine for at least 5 days Stay home Stay home and quarantine for at least 5 full days. Wear a well-fitting mask if you must be around others in your home. Do not travel. Get tested Even if you don't develop symptoms, get tested at least 5 days after you last had close contact with someone with COVID-19. After quarantine Watch for symptoms Watch for symptoms until 10 days after you last had close contact with someone with COVID-19. Avoid travel It is best to avoid travel until a full 10 days after you last had close contact with someone with COVID-19. If you develop symptoms Isolate immediately and get tested. Continue to stay home until you know the results. Wear a well-fitting mask around others. Take precautions until day 10 Wear a well-fitting mask Wear a well-fitting mask for 10 full days any time you are around others inside your home or in public. Do not go to places where you are unable to wear a well-fitting mask. If you must travel during days 6-10, take precautions. Avoid being around people who are more likely to get very sick from COVID-19. IF YOU were exposed to COVID-19 and are  up to dateIF YOU were exposed to COVID-19 and are on COVID-19 vaccinations No  quarantine You do not need to stay home unless you develop symptoms. Get tested Even if you don't develop symptoms, get tested at least 5 days after you last had close contact with someone with COVID-19. Watch for symptoms Watch for symptoms until 10 days after you last had close contact with someone with COVID-19. If you develop symptoms Isolate immediately and get tested. Continue to stay home until you know the results. Wear a well-fitting mask around others. Take precautions until day 10 Wear a well-fitting mask Wear a well-fitting mask for 10 full days any time you are around others inside your home or in public. Do not go to places where you are unable to wear a well-fitting mask. Take precautions if traveling Avoid being around people who are more likely to get very sick from COVID-19. IF YOU were exposed to COVID-19 and had confirmed COVID-19 within the past 90 days (you tested positive using a viral test) No quarantine You do not need to stay home unless you develop symptoms. Watch for symptoms Watch for symptoms until 10 days after you last had close contact with someone with COVID-19. If you develop symptoms Isolate immediately and get tested. Continue to stay home until you know the results. Wear a well-fitting mask around others. Take precautions until day 10 Wear a well-fitting mask Wear a well-fitting mask for 10 full days any time you are around others inside your home or in public. Do not go to places where you are  unable to wear a well-fitting mask. Take precautions if traveling Avoid being around people who are more likely to get very sick from COVID-19. Calculating isolation Day 0 is your first day of symptoms or a positive viral test. Day 1 is the first full day after your symptoms developed or your test specimen was collected. If you have COVID-19 or have symptoms, isolate for at least 5 days. IF YOU tested positive for COVID-19 or have symptoms, regardless of  vaccination status Stay home for at least 5 days Stay home for 5 days and isolate from others in your home. Wear a well-fitting mask if you must be around others in your home. Do not travel. Ending isolation if you had symptoms End isolation after 5 full days if you are fever-free for 24 hours (without the use of fever-reducing medication) and your symptoms are improving. Ending isolation if you did NOT have symptoms End isolation after at least 5 full days after your positive test. If you got very sick from COVID-19 or have a weakened immune system You should isolate for at least 10 days. Consult your doctor before ending isolation. Take precautions until day 10 Wear a well-fitting mask Wear a well-fitting mask for 10 full days any time you are around others inside your home or in public. Do not go to places where you are unable to wear a well-fitting mask. Do not travel Do not travel until a full 10 days after your symptoms started or the date your positive test was taken if you had no symptoms. Avoid being around people who are more likely to get very sick from COVID-19. Definitions Exposure Contact with someone infected with SARS-CoV-2, the virus that causes COVID-19, in a way that increases the likelihood of getting infected with the virus. Close contact A close contact is someone who was less than 6 feet away from an infected person (laboratory-confirmed or a clinical diagnosis) for a cumulative total of 15 minutes or more over a 24-hour period. For example, three individual 5-minute exposures for a total of 15 minutes. People who are exposed to someone with COVID-19 after they completed at least 5 days of isolation are not considered close contacts. Cassandra Rios is a strategy used to prevent transmission of COVID-19 by keeping people who have been in close contact with someone with COVID-19 apart from others. Who does not need to quarantine? If you had close contact with  someone with COVID-19 and you are in one of the following groups, you do not need to quarantine. You are up to date with your COVID-19 vaccines. You had confirmed COVID-19 within the last 90 days (meaning you tested positive using a viral test). If you are up to date with COVID-19 vaccines, you should wear a well-fitting mask around others for 10 days from the date of your last close contact with someone with COVID-19 (the date of last close contact is considered day 0). Get tested at least 5 days after you last had close contact with someone with COVID-19. If you test positive or develop COVID-19 symptoms, isolate from other people and follow recommendations in the Isolation section below. If you tested positive for COVID-19 with a viral test within the previous 90 days and subsequently recovered and remain without COVID-19 symptoms, you do not need to quarantine or get tested after close contact. You should wear a well-fitting mask around others for 10 days from the date of your last close contact with someone with COVID-19 (the date of last  close contact is considered day 0). If you have COVID-19 symptoms, get tested and isolate from other people and follow recommendations in the Isolation section below. Who should quarantine? If you come into close contact with someone with COVID-19, you should quarantine if you are not up to date on COVID-19 vaccines. This includes people who are not vaccinated. What to do for quarantine Stay home and away from other people for at least 5 days (day 0 through day 5) after your last contact with a person who has COVID-19. The date of your exposure is considered day 0. Wear a well-fitting mask when around others at home, if possible. For 10 days after your last close contact with someone with COVID-19, watch for fever (100.38F or greater), cough, shortness of breath, or other COVID-19 symptoms. If you develop symptoms, get tested immediately and isolate until you receive  your test results. If you test positive, follow isolation recommendations. If you do not develop symptoms, get tested at least 5 days after you last had close contact with someone with COVID-19. If you test negative, you can leave your home, but continue to wear a well-fitting mask when around others at home and in public until 10 days after your last close contact with someone with COVID-19. If you test positive, you should isolate for at least 5 days from the date of your positive test (if you do not have symptoms). If you do develop COVID-19 symptoms, isolate for at least 5 days from the date your symptoms began (the date the symptoms started is day 0). Follow recommendations in the isolation section below. If you are unable to get a test 5 days after last close contact with someone with COVID-19, you can leave your home after day 5 if you have been without COVID-19 symptoms throughout the 5-day period. Wear a well-fitting mask for 10 days after your date of last close contact when around others at home and in public. Avoid people who are have weakened immune systems or are more likely to get very sick from COVID-19, and nursing homes and other high-risk settings, until after at least 10 days. If possible, stay away from people you live with, especially people who are at higher risk for getting very sick from COVID-19, as well as others outside your home throughout the full 10 days after your last close contact with someone with COVID-19. If you are unable to quarantine, you should wear a well-fitting mask for 10 days when around others at home and in public. If you are unable to wear a mask when around others, you should continue to quarantine for 10 days. Avoid people who have weakened immune systems or are more likely to get very sick from COVID-19, and nursing homes and other high-risk settings, until after at least 10 days. See additional information about travel. Do not go to places where you are  unable to wear a mask, such as restaurants and some gyms, and avoid eating around others at home and at work until after 10 days after your last close contact with someone with COVID-19. After quarantine Watch for symptoms until 10 days after your last close contact with someone with COVID-19. If you have symptoms, isolate immediately and get tested. Quarantine in high-risk congregate settings In certain congregate settings that have high risk of secondary transmission (such as Systems analyst and detention facilities, homeless shelters, or cruise ships), CDC recommends a 10-day quarantine for residents, regardless of vaccination and booster status. During periods of critical staffing  shortages, facilities may consider shortening the quarantine period for staff to ensure continuity of operations. Decisions to shorten quarantine in these settings should be made in consultation with state, local, tribal, or territorial health departments and should take into consideration the context and characteristics of the facility. CDC's setting-specific guidance provides additional recommendations for these settings. Isolation Isolation is used to separate people with confirmed or suspected COVID-19 from those without COVID-19. People who are in isolation should stay home until it's safe for them to be around others. At home, anyone sick or infected should separate from others, or wear a well-fitting mask when they need to be around others. People in isolation should stay in a specific "sick room" or area and use a separate bathroom if available. Everyone who has presumed or confirmed COVID-19 should stay home and isolate from other people for at least 5 full days (day 0 is the first day of symptoms or the date of the day of the positive viral test for asymptomatic persons). They should wear a mask when around others at home and in public for an additional 5 days. People who are confirmed to have COVID-19 or are showing  symptoms of COVID-19 need to isolate regardless of their vaccination status. This includes: People who have a positive viral test for COVID-19, regardless of whether or not they have symptoms. People with symptoms of COVID-19, including people who are awaiting test results or have not been tested. People with symptoms should isolate even if they do not know if they have been in close contact with someone with COVID-19. What to do for isolation Monitor your symptoms. If you have an emergency warning sign (including trouble breathing), seek emergency medical care immediately. Stay in a separate room from other household members, if possible. Use a separate bathroom, if possible. Take steps to improve ventilation at home, if possible. Avoid contact with other members of the household and pets. Don't share personal household items, like cups, towels, and utensils. Wear a well-fitting mask when you need to be around other people. Learn more about what to do if you are sick and how to notify your contacts. Ending isolation for people who had COVID-19 and had symptoms If you had COVID-19 and had symptoms, isolate for at least 5 days. To calculate your 5-day isolation period, day 0 is your first day of symptoms. Day 1 is the first full day after your symptoms developed. You can leave isolation after 5 full days. You can end isolation after 5 full days if you are fever-free for 24 hours without the use of fever-reducing medication and your other symptoms have improved (Loss of taste and smell may persist for weeks or months after recovery and need not delay the end of isolation). You should continue to wear a well-fitting mask around others at home and in public for 5 additional days (day 6 through day 10) after the end of your 5-day isolation period. If you are unable to wear a mask when around others, you should continue to isolate for a full 10 days. Avoid people who have weakened immune systems or are more  likely to get very sick from COVID-19, and nursing homes and other high-risk settings, until after at least 10 days. If you continue to have fever or your other symptoms have not improved after 5 days of isolation, you should wait to end your isolation until you are fever-free for 24 hours without the use of fever-reducing medication and your other symptoms have improved.  Continue to wear a well-fitting mask through day 10. Contact your healthcare provider if you have questions. See additional information about travel. Do not go to places where you are unable to wear a mask, such as restaurants and some gyms, and avoid eating around others at home and at work until a full 10 days after your first day of symptoms. If an individual has access to a test and wants to test, the best approach is to use an antigen test1 towards the end of the 5-day isolation period. Collect the test sample only if you are fever-free for 24 hours without the use of fever-reducing medication and your other symptoms have improved (loss of taste and smell may persist for weeks or months after recovery and need not delay the end of isolation). If your test result is positive, you should continue to isolate until day 10. If your test result is negative, you can end isolation, but continue to wear a well-fitting mask around others at home and in public until day 10. Follow additional recommendations for masking and avoiding travel as described above. 1As noted in the labeling for authorized over-the counter antigen tests: Negative results should be treated as presumptive. Negative results do not rule out SARS-CoV-2 infection and should not be used as the sole basis for treatment or patient management decisions, including infection control decisions. To improve results, antigen tests should be used twice over a three-day period with at least 24 hours and no more than 48 hours between tests. Note that these recommendations on ending isolation  do not apply to people who are moderately ill or very sick from COVID-19 or have weakened immune systems. See section below for recommendations for when to end isolation for these groups. Ending isolation for people who tested positive for COVID-19 but had no symptoms If you test positive for COVID-19 and never develop symptoms, isolate for at least 5 days. Day 0 is the day of your positive viral test (based on the date you were tested) and day 1 is the first full day after the specimen was collected for your positive test. You can leave isolation after 5 full days. If you continue to have no symptoms, you can end isolation after at least 5 days. You should continue to wear a well-fitting mask around others at home and in public until day 10 (day 6 through day 10). If you are unable to wear a mask when around others, you should continue to isolate for 10 days. Avoid people who have weakened immune systems or are more likely to get very sick from COVID-19, and nursing homes and other high-risk settings, until after at least 10 days. If you develop symptoms after testing positive, your 5-day isolation period should start over. Day 0 is your first day of symptoms. Follow the recommendations above for ending isolation for people who had COVID-19 and had symptoms. See additional information about travel. Do not go to places where you are unable to wear a mask, such as restaurants and some gyms, and avoid eating around others at home and at work until 10 days after the day of your positive test. If an individual has access to a test and wants to test, the best approach is to use an antigen test1 towards the end of the 5-day isolation period. If your test result is positive, you should continue to isolate until day 10. If your test result is positive, you can also choose to test daily and if your test result  is negative, you can end isolation, but continue to wear a well-fitting mask around others at home and in  public until day 10. Follow additional recommendations for masking and avoiding travel as described above. 1As noted in the labeling for authorized over-the counter antigen tests: Negative results should be treated as presumptive. Negative results do not rule out SARS-CoV-2 infection and should not be used as the sole basis for treatment or patient management decisions, including infection control decisions. To improve results, antigen tests should be used twice over a three-day period with at least 24 hours and no more than 48 hours between tests. Ending isolation for people who were moderately or very sick from COVID-19 or have a weakened immune system People who are moderately ill from COVID-19 (experiencing symptoms that affect the lungs like shortness of breath or difficulty breathing) should isolate for 10 days and follow all other isolation precautions. To calculate your 10-day isolation period, day 0 is your first day of symptoms. Day 1 is the first full day after your symptoms developed. If you are unsure if your symptoms are moderate, talk to a healthcare provider for further guidance. People who are very sick from COVID-19 (this means people who were hospitalized or required intensive care or ventilation support) and people who have weakened immune systems might need to isolate at home longer. They may also require testing with a viral test to determine when they can be around others. CDC recommends an isolation period of at least 10 and up to 20 days for people who were very sick from COVID-19 and for people with weakened immune systems. Consult with your healthcare provider about when you can resume being around other people. If you are unsure if your symptoms are severe or if you have a weakened immune system, talk to a healthcare provider for further guidance. People who have a weakened immune system should talk to their healthcare provider about the potential for reduced immune responses to  COVID-19 vaccines and the need to continue to follow current prevention measures (including wearing a well-fitting mask and avoiding crowds and poorly ventilated indoor spaces) to protect themselves against COVID-19 until advised otherwise by their healthcare provider. Close contacts of immunocompromised people--including household members--should also be encouraged to receive all recommended COVID-19 vaccine doses to help protect these people. Isolation in high-risk congregate settings In certain high-risk congregate settings that have high risk of secondary transmission and where it is not feasible to cohort people (such as Systems analyst and detention facilities, homeless shelters, and cruise ships), CDC recommends a 10-day isolation period for residents. During periods of critical staffing shortages, facilities may consider shortening the isolation period for staff to ensure continuity of operations. Decisions to shorten isolation in these settings should be made in consultation with state, local, tribal, or territorial health departments and should take into consideration the context and characteristics of the facility. CDC's setting-specific guidance provides additional recommendations for these settings. This CDC guidance is meant to supplement--not replace--any federal, state, local, territorial, or tribal health and safety laws, rules, and regulations. Recommendations for specific settings These recommendations do not apply to healthcare professionals. For guidance specific to these settings, see Healthcare professionals: Interim Guidance for Optician, dispensing with SARS-CoV-2 Infection or Exposure to SARS-CoV-2 Patients, residents, and visitors to healthcare settings: Interim Infection Prevention and Control Recommendations for Healthcare Personnel During the [place] 2019 (COVID-19) Pandemic Additional setting-specific guidance and recommendations are available. These  recommendations on quarantine and isolation do apply to [place]  settings. Additional guidance is available here: Overview of COVID-19 Quarantine for K-12 Schools Travelers: Travel information and recommendations Congregate facilities and other settings: [employer] for community, work, and school settings Ongoing COVID-19 exposure FAQs I live with someone with COVID-19, but I cannot be separated from them. How do we manage quarantine in this situation? It is very important for people with COVID-19 to remain apart from other people, if possible, even if they are living together. If separation of the person with COVID-19 from others that they live with is not possible, the other people that they live with will have ongoing exposure, meaning they will be repeatedly exposed until that person is no longer able to spread the virus to other people. In this situation, there are precautions you can take to limit the spread of COVID-19: The person with COVID-19 and everyone they live with should wear a well-fitting mask inside the home. If possible, one person should care for the person with COVID-19 to limit the number of people who are in close contact with the infected person. Take steps to protect yourself and others to reduce transmission in the home: Quarantine if you are not up to date with your COVID-19 vaccines. Isolate if you are sick or tested positive for COVID-19, even if you don't have symptoms. Learn more about the public health recommendations for testing, mask use and quarantine of close contacts, like yourself, who have ongoing exposure. These recommendations differ depending on your vaccination status. What should I do if I have ongoing exposure to COVID-19 from someone I live with? Recommendations for this situation depend on your vaccination status: If you are not up to date on COVID-19 vaccines and have ongoing exposure to COVID-19, you should: Begin quarantine immediately and  continue to quarantine throughout the isolation period of the person with COVID-19. Continue to quarantine for an additional 5 days starting the day after the end of isolation for the person with COVID-19. Get tested at least 5 days after the end of isolation of the infected person that lives with them. If you test negative, you can leave the home but should continue to wear a well-fitting mask when around others at home and in public until 10 days after the end of isolation for the person with COVID-19. Isolate immediately if you develop symptoms of COVID-19 or test positive. If you are up to date with COVID-19 vaccines and have ongoing exposure to COVID-19, you should: Get tested at least 5 days after your first exposure. A person with COVID-19 is considered infectious starting 2 days before they develop symptoms, or 2 days before the date of their positive test if they do not have symptoms. Get tested again at least 5 days after the end of isolation for the person with COVID-19. Wear a well-fitting mask when you are around the person with COVID-19, and do this throughout their isolation period. Wear a well-fitting mask around others for 10 days after the infected person's isolation period ends. Isolate immediately if you develop symptoms of COVID-19 or test positive. What should I do if multiple people I live with test positive for COVID-19 at different times? Recommendations for this situation depend on your vaccination status: If you are not up to date with your COVID-19 vaccines, you should: Quarantine throughout the isolation period of any infected person that you live with. Continue to quarantine until 5 days after the end of isolation date for the most recently infected person that lives with you. For example, if  the last day of isolation of the person most recently infected with COVID-19 was June 30, the new 5-day quarantine period starts on July 1. Get tested at least 5 days after the end  of isolation for the most recently infected person that lives with you. Wear a well-fitting mask when you are around any person with COVID-19 while that person is in isolation. Wear a well-fitting mask when you are around other people until 10 days after your last close contact. Isolate immediately if you develop symptoms of COVID-19 or test positive. If you are up to date with your COVID-19 vaccines, you should: Get tested at least 5 days after your first exposure. A person with COVID-19 is considered infectious starting 2 days before they developed symptoms, or 2 days before the date of their positive test if they do not have symptoms. Get tested again at least 5 days after the end of isolation for the most recently infected person that lives with you. Wear a well-fitting mask when you are around any person with COVID-19 while that person is in isolation. Wear a well-fitting mask around others for 10 days after the end of isolation for the most recently infected person that lives with you. For example, if the last day of isolation for the person most recently infected with COVID-19 was June 30, the new 10-day period to wear a well-fitting mask indoors in public starts on July 1. Isolate immediately if you develop symptoms of COVID-19 or test positive. I had COVID-19 and completed isolation. Do I have to quarantine or get tested if someone I live with gets COVID-19 shortly after I completed isolation? No. If you recently completed isolation and someone that lives with you tests positive for the virus that causes COVID-19 shortly after the end of your isolation period, you do not have to quarantine or get tested as long as you do not develop new symptoms. Once all of the people that live together have completed isolation or quarantine, refer to the guidance below for new exposures to COVID-19. If you had COVID-19 in the previous 90 days and then came into close contact with someone with COVID-19, you do  not have to quarantine or get tested if you do not have symptoms. But you should: Wear a well-fitting mask indoors in public for 10 days after your last close contact. Monitor for COVID-19 symptoms for 10 days from the date of your last close contact. Isolate immediately and get tested if symptoms develop. If more than 90 days have passed since your recovery from infection, follow CDC's recommendations for close contacts. These recommendations will differ depending on your vaccination status. 09/01/2020 Content source: [facility] ([facility]), Division of Viral Diseases This information is not intended to replace advice given to you by your health care provider. Make sure you discuss any questions you have with your health care provider. Document Revised: 10/07/2020 Document Reviewed: 10/07/2020 Elsevier Patient Education  [place].

## 2021-02-22 NOTE — Assessment & Plan Note (Signed)
anitiviral sent in con't otc cough med Tylenol prn  Quarantine for 5 days -- then if symptoms are improving may leave house with mask on for 5 more days  rto prn

## 2021-02-22 NOTE — Progress Notes (Addendum)
Virtual telephone visit    Virtual Visit via Telephone Note   This visit type was conducted due to national recommendations for restrictions regarding the COVID-19 Pandemic (e.g. social distancing) in an effort to limit this patient's exposure and mitigate transmission in our community. Due to her co-morbid illnesses, this patient is at least at moderate risk for complications without adequate follow up. This format is felt to be most appropriate for this patient at this time. The patient did not have access to video technology or had technical difficulties with video requiring transitioning to audio format only (telephone). Physical exam was limited to content and character of the telephone converstion. Cassandra Rios was able to get the patient set up on a telephone visit.   Patient location: Home Patient and provider in visit Provider location: Office  I discussed the limitations of evaluation and management by telemedicine and the availability of in person appointments. The patient expressed understanding and agreed to proceed.   Visit Date: 02/22/2021  Today's healthcare provider: [name], DO     Subjective:    Patient ID: Cassandra Rios, female    DOB: Aug 01, 1950, 70 y.o.   MRN: [id number]  Chief Complaint  Patient presents with   Covid Positive    Pt tested positive yesterday, pt has headache, congestion, and sore throat     HPI Patient is in today for a telephone visit.   She complains of headache, cough, fevers around 101-102 degrees F, sore throat, congestion since last Sunday, 02/20/2021. She tested positive for Covid-19 on 02/21/2021. She is taking OTC mucinex and OTC tylenol to manage her congestion and cough. She is taking tramadol PRN for her headache. She has not taken her blood pressure since testing positive.  She is hesitant to take antiviral medications to manage her symptoms due to the potential complications with her current medications.  She would like to discuss complications with her pharmacist before taking them.  She is interested in taking the new Covid-19 booster vaccine and is willing to wait at least a month before getting the vaccine.   Past Medical History:  Diagnosis Date   AAA (abdominal aortic aneurysm) (HCC)    Allergic rhinitis    Chronic hoarseness    Colon polyps    COPD (chronic obstructive pulmonary disease) (HCC)    Dr. Marchelle Gearing   Dyslipidemia    Hypertension    Hypothyroidism    MS (multiple sclerosis) (HCC)    Obesity    Stomach ulcer from aspirin/ibuprofen-like drugs (NSAID's)     Past Surgical History:  Procedure Laterality Date   ABDOMINAL HYSTERECTOMY     BARIATRIC SURGERY  09/07/08   lap band   CARPAL TUNNEL RELEASE     CATARACT EXTRACTION Right 11/08/2017   CATARACT EXTRACTION Left 11/22/2017   CATARACT EXTRACTION, BILATERAL      Toric lenses for astigmatism   CHOLECYSTECTOMY     CHOLECYSTECTOMY     HIATAL HERNIA REPAIR  09/07/08   LEFT HEART CATH AND CORONARY ANGIOGRAPHY N/A 07/09/2019   Procedure: LEFT HEART CATH AND CORONARY ANGIOGRAPHY;  Surgeon: Lyn Records, MD;  [facility];  Service: Cardiovascular;  Laterality: N/A;    Family History  Problem Relation Age of Onset   COPD Father    Heart disease Father    AAA (abdominal aortic aneurysm) Father    COPD Sister    Arthritis Sister    AAA (abdominal aortic aneurysm) Sister  Dementia Sister    Other Sister        brain injury   Stroke Mother 55   Heart disease Mother        congenital heart defect?    Social History   Socioeconomic History   Marital status: Married    Spouse name: Casimiro Needle   Number of children: 2   Years of education: 16   Highest education level: Not on file  Occupational History   Occupation: retired  Tobacco Use   Smoking status: Former    Packs/day: 2.00    Years: 40.00    Pack years: 80.00    Types: Cigarettes    Quit date: 04/05/2007    Years since quitting: 13.8    Smokeless tobacco: Never  Vaping Use   Vaping Use: Not on file  Substance and Sexual Activity   Alcohol use: No   Drug use: No   Sexual activity: Never    Partners: Male  Other Topics Concern   Not on file  Social History Narrative   Patient is right handed,reside in home with husband   Social Determinants of Health   Financial Resource Strain: Not on file  Food Insecurity: Not on file  Transportation Needs: Not on file  Physical Activity: Not on file  Stress: Not on file  Social Connections: Not on file  Intimate Partner Violence: Not on file    Outpatient Medications Prior to Visit  Medication Sig Dispense Refill   acetaminophen (TYLENOL) 500 MG tablet Take 1,000 mg by mouth every 6 (six) hours as needed for moderate pain or headache.     aspirin EC 81 MG tablet Take 1 tablet (81 mg total) by mouth daily. 90 tablet 3   atorvastatin (LIPITOR) 80 MG tablet TAKE 1 TABLET BY MOUTH EVERY DAY 90 tablet 3   calcium carbonate (TUMS - DOSED IN MG ELEMENTAL CALCIUM) 500 MG chewable tablet Chew 2 tablets by mouth daily as needed for indigestion or heartburn.     gabapentin (NEURONTIN) 300 MG capsule Take 600 mg by mouth every evening.      HYDROcodone-acetaminophen (NORCO/VICODIN) 5-325 MG per tablet Take 0.5 tablets by mouth 2 (two) times daily as needed for moderate pain.      Interferon Beta-1b (BETASERON/EXTAVIA) 0.3 MG KIT injection Inject 0.25 mg into the skin every other day.     levocetirizine (XYZAL) 5 MG tablet Take 1 tablet (5 mg total) by mouth every evening. 30 tablet 5   levothyroxine (SYNTHROID) 112 MCG tablet TAKE 1 TABLET BY MOUTH DAILY BEFORE BREAKFAST. 90 tablet 1   mometasone (NASONEX) 50 MCG/ACT nasal spray Place 2 sprays into the nose as needed.     Polyethyl Glycol-Propyl Glycol (SYSTANE ULTRA OP) Place 1 drop into the right eye daily as needed (dry eye).      sertraline (ZOLOFT) 50 MG tablet TAKE 1 TABLET BY MOUTH EVERY DAY 90 tablet 1   silver sulfADIAZINE  (SILVADENE) 1 % cream Apply daily to irritated areas under abdomen     telmisartan-hydrochlorothiazide (MICARDIS HCT) 40-12.5 MG tablet TAKE 1/2 TABLET BY MOUTH EVERY DAY 45 tablet 1   triamcinolone ointment (KENALOG) 0.1 % Apply daily to affected area     No facility-administered medications prior to visit.    Allergies  Allergen Reactions   Codeine Itching and Nausea And Vomiting   Hydromorphone Hcl Nausea And Vomiting   Ibuprofen Other (See Comments)    stomach ulcers   Morphine Sulfate     "makes  my blood pressure bottom out"   Metronidazole Hives, Nausea And Vomiting and Rash    Review of Systems  Constitutional:  Positive for fever (101-102 degrees F). Negative for chills.  HENT:  Positive for congestion and sore throat.   Respiratory:  Positive for cough.   Cardiovascular:  Negative for chest pain.  Gastrointestinal:  Negative for abdominal pain, constipation, diarrhea, nausea and vomiting.  Neurological:  Positive for headaches. Negative for dizziness and weakness.      Objective:    Physical Exam Vitals and nursing note reviewed.  Constitutional:      Appearance: Normal appearance.  Pulmonary:     Effort: Pulmonary effort is normal.  Neurological:     Mental Status: She is alert.  Psychiatric:        Mood and Affect: Mood normal.        Thought Content: Thought content normal.    Temp (!) 101 F (38.3 C)  Wt Readings from Last 3 Encounters:  11/22/20 253 lb 6.4 oz (114.9 kg)  11/16/20 254 lb 3.2 oz (115.3 kg)  09/22/20 255 lb (115.7 kg)    Diabetic Foot Exam - Simple   No data filed    Lab Results  Component Value Date   WBC 5.8 11/16/2020   HGB 14.1 11/16/2020   HCT 42.2 11/16/2020   PLT 131.0 (L) 11/16/2020   GLUCOSE 106 (H) 11/16/2020   CHOL 137 11/16/2020   TRIG 130.0 11/16/2020   HDL 39.80 11/16/2020   LDLCALC 71 11/16/2020   ALT 19 11/16/2020   AST 22 11/16/2020   NA 138 11/16/2020   K 4.7 11/16/2020   CL 102 11/16/2020   CREATININE  0.85 11/16/2020   BUN 12 11/16/2020   CO2 29 11/16/2020   TSH 1.21 11/16/2020   HGBA1C 5.9 09/25/2016   MICROALBUR 2.5 (H) 09/15/2014    Lab Results  Component Value Date   TSH 1.21 11/16/2020   Lab Results  Component Value Date   WBC 5.8 11/16/2020   HGB 14.1 11/16/2020   HCT 42.2 11/16/2020   MCV 89.8 11/16/2020   PLT 131.0 (L) 11/16/2020   Lab Results  Component Value Date   NA 138 11/16/2020   K 4.7 11/16/2020   CO2 29 11/16/2020   GLUCOSE 106 (H) 11/16/2020   BUN 12 11/16/2020   CREATININE 0.85 11/16/2020   BILITOT 0.6 11/16/2020   ALKPHOS 73 11/16/2020   AST 22 11/16/2020   ALT 19 11/16/2020   PROT 6.6 11/16/2020   ALBUMIN 4.0 11/16/2020   CALCIUM 9.4 11/16/2020   ANIONGAP 7 12/30/2017   GFR 69.24 11/16/2020   Lab Results  Component Value Date   CHOL 137 11/16/2020   Lab Results  Component Value Date   HDL 39.80 11/16/2020   Lab Results  Component Value Date   LDLCALC 71 11/16/2020   Lab Results  Component Value Date   TRIG 130.0 11/16/2020   Lab Results  Component Value Date   CHOLHDL 3 11/16/2020   Lab Results  Component Value Date   HGBA1C 5.9 09/25/2016       Assessment & Plan:   Problem List Items Addressed This Visit       Unprioritized   COVID-19 - Primary    anitiviral sent in con't otc cough med Tylenol prn  Quarantine for 5 days -- then if symptoms are improving may leave house with mask on for 5 more days  rto prn       Relevant Medications  molnupiravir EUA (LAGEVRIO) 200 mg CAPS capsule     Meds ordered this encounter  Medications   molnupiravir EUA (LAGEVRIO) 200 mg CAPS capsule    Sig: Take 4 capsules (800 mg total) by mouth 2 (two) times daily for 5 days.    Dispense:  40 capsule    Refill:  0     I discussed the assessment and treatment plan with the patient. The patient was provided an opportunity to ask questions and all were answered. The patient agreed with the plan and demonstrated an  understanding of the instructions.   The patient was advised to call back or seek an in-person evaluation if the symptoms worsen or if the condition fails to improve as anticipated.  I,[name],acting as a Neurosurgeon for [employer], DO.,have documented all relevant documentation on the behalf of [name], DO,as directed by  [name], DO while in the presence of [name], DO.  I provided 20 minutes of non-face-to-face time during this encounter.   [name], DO [facility] [phone] (phone) [phone] (fax)  [facility]

## 2021-02-25 ENCOUNTER — Other Ambulatory Visit: Payer: Self-pay | Admitting: Interventional Cardiology

## 2021-03-08 ENCOUNTER — Other Ambulatory Visit: Payer: Self-pay

## 2021-03-08 DIAGNOSIS — I714 Abdominal aortic aneurysm, without rupture, unspecified: Secondary | ICD-10-CM

## 2021-03-23 ENCOUNTER — Other Ambulatory Visit: Payer: Self-pay

## 2021-03-23 ENCOUNTER — Ambulatory Visit: Payer: PPO | Admitting: Vascular Surgery

## 2021-03-23 ENCOUNTER — Encounter: Payer: Self-pay | Admitting: Vascular Surgery

## 2021-03-23 ENCOUNTER — Ambulatory Visit (HOSPITAL_COMMUNITY)
Admission: RE | Admit: 2021-03-23 | Discharge: 2021-03-23 | Disposition: A | Discharge status: Home / Self Care | Payer: PPO | Source: Ambulatory Visit | Attending: Vascular Surgery | Admitting: Vascular Surgery

## 2021-03-23 VITALS — BP 156/76 | HR 55 | Temp 97.7°F | Resp 20 | Ht 66.0 in | Wt 250.0 lb

## 2021-03-23 DIAGNOSIS — I714 Abdominal aortic aneurysm, without rupture, unspecified: Secondary | ICD-10-CM

## 2021-03-23 NOTE — Progress Notes (Signed)
Patient ID: Franka Mcdannel, female   DOB: April 04, 1951, 70 y.o.   MRN: [id number]  Reason for Consult: Follow-up   Referred by Zola Button, Grayling Congress, *  Subjective:     HPI:  Lavida Maples is a 70 y.o. female known history of abdominal aortic aneurysm.  Her sister died from complications from surgery during repair of her aneurysm.  Her father also had a known 3.8 [facility]  She does have known back pain she undergoes injections.  She does not have any new abdominal pain.  Overall well she has been stable since her last visit.  Past Medical History:  Diagnosis Date   AAA (abdominal aortic aneurysm)    Allergic rhinitis    Chronic hoarseness    Colon polyps    COPD (chronic obstructive pulmonary disease) (HCC)    Dr. Marchelle Gearing   Dyslipidemia    Hypertension    Hypothyroidism    MS (multiple sclerosis) (HCC)    Obesity    Stomach ulcer from aspirin/ibuprofen-like drugs (NSAID's)    Family History  Problem Relation Age of Onset   COPD Father    Heart disease Father    AAA (abdominal aortic aneurysm) Father    COPD Sister    Arthritis Sister    AAA (abdominal aortic aneurysm) Sister    Dementia Sister    Other Sister        brain injury   Stroke Mother 74   Heart disease Mother        congenital heart defect?   Past Surgical History:  Procedure Laterality Date   ABDOMINAL HYSTERECTOMY     BARIATRIC SURGERY  09/07/08   lap band   CARPAL TUNNEL RELEASE     CATARACT EXTRACTION Right 11/08/2017   CATARACT EXTRACTION Left 11/22/2017   CATARACT EXTRACTION, BILATERAL      Toric lenses for astigmatism   CHOLECYSTECTOMY     CHOLECYSTECTOMY     HIATAL HERNIA REPAIR  09/07/08   LEFT HEART CATH AND CORONARY ANGIOGRAPHY N/A 07/09/2019   Procedure: LEFT HEART CATH AND CORONARY ANGIOGRAPHY;  Surgeon: Lyn Records, MD;  Location: [facility];  Service: Cardiovascular;  Laterality: N/A;    Short Social History:  Social History   Tobacco Use    Smoking status: Former    Packs/day: 2.00    Years: 40.00    Pack years: 80.00    Types: Cigarettes    Quit date: 04/05/2007    Years since quitting: 13.9   Smokeless tobacco: Never  Substance Use Topics   Alcohol use: No    Allergies  Allergen Reactions   Codeine Itching and Nausea And Vomiting   Hydromorphone Hcl Nausea And Vomiting   Ibuprofen Other (See Comments)    stomach ulcers   Morphine Sulfate     "makes my blood pressure bottom out"   Metronidazole Hives, Nausea And Vomiting and Rash    Current Outpatient Medications  Medication Sig Dispense Refill   acetaminophen (TYLENOL) 500 MG tablet Take 1,000 mg by mouth every 6 (six) hours as needed for moderate pain or headache.     aspirin EC 81 MG tablet Take 1 tablet (81 mg total) by mouth daily. 90 tablet 3   atorvastatin (LIPITOR) 80 MG tablet TAKE 1 TABLET BY MOUTH EVERY DAY 90 tablet 3   calcium carbonate (TUMS - DOSED IN MG ELEMENTAL CALCIUM) 500 MG chewable tablet Chew 2 tablets by mouth daily as  needed for indigestion or heartburn.     gabapentin (NEURONTIN) 300 MG capsule Take 600 mg by mouth every evening.      HYDROcodone-acetaminophen (NORCO/VICODIN) 5-325 MG per tablet Take 0.5 tablets by mouth 2 (two) times daily as needed for moderate pain.      Interferon Beta-1b (BETASERON/EXTAVIA) 0.3 MG KIT injection Inject 0.25 mg into the skin every other day.     levocetirizine (XYZAL) 5 MG tablet Take 1 tablet (5 mg total) by mouth every evening. 30 tablet 5   levothyroxine (SYNTHROID) 112 MCG tablet TAKE 1 TABLET BY MOUTH DAILY BEFORE BREAKFAST. 90 tablet 1   mometasone (NASONEX) 50 MCG/ACT nasal spray Place 2 sprays into the nose as needed.     Polyethyl Glycol-Propyl Glycol (SYSTANE ULTRA OP) Place 1 drop into the right eye daily as needed (dry eye).      sertraline (ZOLOFT) 50 MG tablet TAKE 1 TABLET BY MOUTH EVERY DAY 90 tablet 1   silver sulfADIAZINE (SILVADENE) 1 % cream Apply daily to irritated areas under  abdomen     telmisartan-hydrochlorothiazide (MICARDIS HCT) 40-12.5 MG tablet TAKE 1/2 TABLET BY MOUTH EVERY DAY 45 tablet 1   triamcinolone ointment (KENALOG) 0.1 % Apply daily to affected area     No current facility-administered medications for this visit.    Review of Systems  Constitutional:  Constitutional negative. HENT: HENT negative.  Eyes: Eyes negative.  Respiratory: Respiratory negative.  Cardiovascular: Cardiovascular negative.  GI: Gastrointestinal negative.  Musculoskeletal: Positive for back pain.  Skin: Skin negative.  Neurological: Neurological negative. Hematologic: Hematologic/lymphatic negative.  Psychiatric: Psychiatric negative.       Objective:  Objective   Vitals:   03/23/21 0947  BP: (!) 156/76  Pulse: (!) 55  Resp: 20  Temp: 97.7 F (36.5 C)  SpO2: 98%  Weight: 250 lb (113.4 kg)  Height: 5\' 6"  (1.676 m)   Body mass index is 40.35 kg/m.  Physical Exam HENT:     Head: Normocephalic.     Nose:     Comments: Wearing a mask Eyes:     Pupils: Pupils are equal, round, and reactive to light.  Cardiovascular:     Rate and Rhythm: Normal rate.     Pulses: Normal pulses.  Pulmonary:     Effort: Pulmonary effort is normal.  Abdominal:     General: Abdomen is flat.     Palpations: Abdomen is soft. There is no mass.  Musculoskeletal:        General: Normal range of motion.     Cervical back: Normal range of motion and neck supple.     Right lower leg: No edema.     Left lower leg: No edema.  Skin:    General: Skin is warm and dry.     Capillary Refill: Capillary refill takes less than 2 seconds.  Neurological:     General: No focal deficit present.     Mental Status: She is alert.  Psychiatric:        Mood and Affect: Mood normal.        Behavior: Behavior normal.        Thought Content: Thought content normal.        Judgment: Judgment normal.    Data: Abdominal Aorta Findings:   +-----------+-------+----------+----------+--------+--------+--------+  Location   AP (cm)Trans (cm)PSV (cm/s)WaveformThrombusComments  +-----------+-------+----------+----------+--------+--------+--------+  Proximal   2.17   2.30      62                                  +-----------+-------+----------+----------+--------+--------+--------+  Mid        3.51   3.49      56                                  +-----------+-------+----------+----------+--------+--------+--------+  Distal     4.55   4.26      34                                  +-----------+-------+----------+----------+--------+--------+--------+  RT CIA Prox1.7    1.8       130                                 +-----------+-------+----------+----------+--------+--------+--------+   Visualization of the Left CIA Proximal artery was limited.        Summary:  Abdominal Aorta: There is evidence of abnormal dilatation of the distal  Abdominal aorta. The largest aortic measurement is 4.6 cm. The largest  aortic diameter has increased compared to prior exam. Previous diameter  measurement was 4.4 cm obtained on  03/12/2020.  The right common iliac artery measures 1.7 x 1.8 cm in diameter.      Assessment/Plan:     70 year old female with 4.6 cm abdominal aortic aneurysm which is minimally changed since prior study.  We will get her back in 6 months for repeat evaluation.  I have discussed the risks of 4.6 cm aneurysm being less than 5% rupture but not 0%.  We discussed the signs and symptoms of rupture for which to seek emergent medical evaluation.  Short of this we will see her in 6 months with repeat duplex.  When the aneurysm is measured 5 cm by duplex we will obtain CT scan she demonstrates good understanding of this     Maeola Harman MD [facility]

## 2021-03-25 ENCOUNTER — Other Ambulatory Visit: Payer: Self-pay

## 2021-03-25 DIAGNOSIS — I714 Abdominal aortic aneurysm, without rupture, unspecified: Secondary | ICD-10-CM

## 2021-05-03 DIAGNOSIS — M5416 Radiculopathy, lumbar region: Secondary | ICD-10-CM | POA: Diagnosis not present

## 2021-05-03 DIAGNOSIS — M47896 Other spondylosis, lumbar region: Secondary | ICD-10-CM | POA: Diagnosis not present

## 2021-05-03 DIAGNOSIS — Z79891 Long term (current) use of opiate analgesic: Secondary | ICD-10-CM | POA: Diagnosis not present

## 2021-05-05 ENCOUNTER — Other Ambulatory Visit: Payer: Self-pay | Admitting: Family Medicine

## 2021-05-05 DIAGNOSIS — Z1231 Encounter for screening mammogram for malignant neoplasm of breast: Secondary | ICD-10-CM

## 2021-05-10 DIAGNOSIS — Z23 Encounter for immunization: Secondary | ICD-10-CM | POA: Diagnosis not present

## 2021-05-18 ENCOUNTER — Telehealth: Payer: Self-pay | Admitting: Adult Health

## 2021-05-18 NOTE — Telephone Encounter (Signed)
Spoke to pt and she needs BAYER PAP for Betaseron filled out and she will be in 05-25-21 for appt to see MM/NP and will sign off then.  She will bring in her financial p/w , and sign then we will fax for her.  ( I placed the form in our inbox (where referrals are - pod 4).

## 2021-05-18 NOTE — Telephone Encounter (Signed)
Pt called states it is time to have her Interferon Beta-1b (BETASERON/EXTAVIA) 0.3 MG KIT injection form completed. Pt requesting  call back. °

## 2021-05-19 ENCOUNTER — Other Ambulatory Visit: Payer: Self-pay | Admitting: Family Medicine

## 2021-05-19 DIAGNOSIS — F418 Other specified anxiety disorders: Secondary | ICD-10-CM

## 2021-05-19 DIAGNOSIS — L900 Lichen sclerosus et atrophicus: Secondary | ICD-10-CM | POA: Diagnosis not present

## 2021-05-19 DIAGNOSIS — L292 Pruritus vulvae: Secondary | ICD-10-CM | POA: Diagnosis not present

## 2021-05-21 DIAGNOSIS — M47816 Spondylosis without myelopathy or radiculopathy, lumbar region: Secondary | ICD-10-CM | POA: Diagnosis not present

## 2021-05-23 ENCOUNTER — Ambulatory Visit: Payer: PPO | Admitting: Adult Health

## 2021-05-24 ENCOUNTER — Encounter: Payer: Self-pay | Admitting: Family Medicine

## 2021-05-24 ENCOUNTER — Ambulatory Visit (INDEPENDENT_AMBULATORY_CARE_PROVIDER_SITE_OTHER): Payer: PPO | Admitting: Family Medicine

## 2021-05-24 VITALS — BP 120/70 | HR 57 | Temp 98.1°F | Resp 18 | Ht 66.0 in | Wt 249.2 lb

## 2021-05-24 DIAGNOSIS — Z79899 Other long term (current) drug therapy: Secondary | ICD-10-CM

## 2021-05-24 DIAGNOSIS — I10 Essential (primary) hypertension: Secondary | ICD-10-CM | POA: Diagnosis not present

## 2021-05-24 DIAGNOSIS — Z23 Encounter for immunization: Secondary | ICD-10-CM

## 2021-05-24 DIAGNOSIS — F419 Anxiety disorder, unspecified: Secondary | ICD-10-CM | POA: Diagnosis not present

## 2021-05-24 DIAGNOSIS — E039 Hypothyroidism, unspecified: Secondary | ICD-10-CM

## 2021-05-24 DIAGNOSIS — G35 Multiple sclerosis: Secondary | ICD-10-CM | POA: Diagnosis not present

## 2021-05-24 DIAGNOSIS — E785 Hyperlipidemia, unspecified: Secondary | ICD-10-CM | POA: Diagnosis not present

## 2021-05-24 LAB — COMPREHENSIVE METABOLIC PANEL
ALT: 22 U/L (ref 0–35)
AST: 30 U/L (ref 0–37)
Albumin: 3.8 g/dL (ref 3.5–5.2)
Alkaline Phosphatase: 73 U/L (ref 39–117)
BUN: 11 mg/dL (ref 6–23)
CO2: 29 mEq/L (ref 19–32)
Calcium: 9.4 mg/dL (ref 8.4–10.5)
Chloride: 101 mEq/L (ref 96–112)
Creatinine, Ser: 0.78 mg/dL (ref 0.40–1.20)
GFR: 76.48 mL/min (ref >60.00)
Glucose, Bld: 98 mg/dL (ref 70–99)
Potassium: 4.5 mEq/L (ref 3.5–5.1)
Sodium: 137 mEq/L (ref 135–145)
Total Bilirubin: 0.7 mg/dL (ref 0.2–1.2)
Total Protein: 6.6 g/dL (ref 6.0–8.3)

## 2021-05-24 LAB — CBC WITH DIFFERENTIAL/PLATELET
Basophils Absolute: 0 10*3/uL (ref 0.0–0.1)
Basophils Relative: 0.6 % (ref 0.0–3.0)
Eosinophils Absolute: 0.1 10*3/uL (ref 0.0–0.7)
Eosinophils Relative: 2.1 % (ref 0.0–5.0)
HCT: 41.9 % (ref 36.0–46.0)
Hemoglobin: 13.9 g/dL (ref 12.0–15.0)
Lymphocytes Relative: 35.1 % (ref 12.0–46.0)
Lymphs Abs: 2 10*3/uL (ref 0.7–4.0)
MCHC: 33.2 g/dL (ref 30.0–36.0)
MCV: 88.6 fl (ref 78.0–100.0)
Monocytes Absolute: 0.6 10*3/uL (ref 0.1–1.0)
Monocytes Relative: 10.6 % (ref 3.0–12.0)
Neutro Abs: 2.9 10*3/uL (ref 1.4–7.7)
Neutrophils Relative %: 51.6 % (ref 43.0–77.0)
Platelets: 126 10*3/uL — ABNORMAL LOW (ref 150.0–400.0)
RBC: 4.73 Mil/uL (ref 3.87–5.11)
RDW: 13.8 % (ref 11.5–15.5)
WBC: 5.6 10*3/uL (ref 4.0–10.5)

## 2021-05-24 LAB — LIPID PANEL
Cholesterol: 142 mg/dL (ref 0–200)
HDL: 42 mg/dL (ref >39.00)
LDL Cholesterol: 71 mg/dL (ref 0–99)
NonHDL: 99.77
Total CHOL/HDL Ratio: 3
Triglycerides: 145 mg/dL (ref 0.0–149.0)
VLDL: 29 mg/dL (ref 0.0–40.0)

## 2021-05-24 LAB — TSH: TSH: 1.04 u[IU]/mL (ref 0.35–5.50)

## 2021-05-24 MED ORDER — FLUOXETINE HCL 40 MG PO CAPS: 40.0000 mg | ORAL_CAPSULE | Freq: Every day | ORAL | 3 refills | Status: DC

## 2021-05-24 MED ORDER — TRAMADOL HCL 50 MG PO TABS: 50.0000 mg | ORAL_TABLET | Freq: Three times a day (TID) | ORAL | 0 refills | Status: AC | PRN

## 2021-05-24 NOTE — Assessment & Plan Note (Signed)
Restart prozac  F/u 3-4 months or sooner prn

## 2021-05-24 NOTE — Progress Notes (Addendum)
Subjective:   By signing my name below, I, Cassandra Rios, attest that this documentation has been prepared under the direction and in the presence of Dr. Seabron Spates, DO. 05/24/2021    Patient ID: Cassandra Rios, female    DOB: 1950-12-18, 70 y.o.   MRN: [id number]  Chief Complaint  Patient presents with   Hyperlipidemia   Hypothyroidism   Follow-up    Hyperlipidemia Pertinent negatives include no chest pain or shortness of breath.  Patient is in today for a office visit.  She stopped taking 50 mg Zoloft daily PO due to developing indigestion while taking it. She slowly weaned off of Zoloft. She found her indigestion has went away after she completely stopped taking it. She is requesting to take Prozac to manage her symptoms.  She reports developing headaches when receiving MS injections. She is taking tramadol to manage her headaches. She is requesting a refill on it as well. She has an upcomming appointment with her neurosurgeon next month.  She denies having any leg swelling at this time.  She has an upcomming mammogram next month.  She recently received the bivalent Covid-19 vaccine. She is interested in receiving a flu vaccine during this visit.  She continue seeing her dermatologist specialist regularly.   Health Maintenance Due  Topic Date Due   Zoster Vaccines- Shingrix (1 of 2) Never done   COVID-19 Vaccine (3 - Pfizer risk series) 09/15/2019   TETANUS/TDAP  03/06/2021    Past Medical History:  Diagnosis Date   AAA (abdominal aortic aneurysm)    Allergic rhinitis    Chronic hoarseness    Colon polyps    COPD (chronic obstructive pulmonary disease) (HCC)    Dr. Marchelle Gearing   Dyslipidemia    Hypertension    Hypothyroidism    MS (multiple sclerosis) (HCC)    Obesity    Stomach ulcer from aspirin/ibuprofen-like drugs (NSAID's)     Past Surgical History:  Procedure Laterality Date   ABDOMINAL HYSTERECTOMY     BARIATRIC SURGERY  09/07/08   lap band    CARPAL TUNNEL RELEASE     CATARACT EXTRACTION Right 11/08/2017   CATARACT EXTRACTION Left 11/22/2017   CATARACT EXTRACTION, BILATERAL      Toric lenses for astigmatism   CHOLECYSTECTOMY     CHOLECYSTECTOMY     HIATAL HERNIA REPAIR  09/07/08   LEFT HEART CATH AND CORONARY ANGIOGRAPHY N/A 07/09/2019   Procedure: LEFT HEART CATH AND CORONARY ANGIOGRAPHY;  Surgeon: Lyn Records, MD;  Location: [facility];  Service: Cardiovascular;  Laterality: N/A;    Family History  Problem Relation Age of Onset   COPD Father    Heart disease Father    AAA (abdominal aortic aneurysm) Father    COPD Sister    Arthritis Sister    AAA (abdominal aortic aneurysm) Sister    Dementia Sister    Other Sister        brain injury   Stroke Mother 59   Heart disease Mother        congenital heart defect?    Social History   Socioeconomic History   Marital status: Married    Spouse name: Casimiro Needle   Number of children: 2   Years of education: 16   Highest education level: Not on file  Occupational History   Occupation: retired  Tobacco Use   Smoking status: Former    Packs/day: 2.00    Years: 40.00    Pack years: 80.00  Types: Cigarettes    Quit date: 04/05/2007    Years since quitting: 14.1   Smokeless tobacco: Never  Vaping Use   Vaping Use: Not on file  Substance and Sexual Activity   Alcohol use: No   Drug use: No   Sexual activity: Never    Partners: Male  Other Topics Concern   Not on file  Social History Narrative   Patient is right handed,reside in home with husband   Social Determinants of Health   Financial Resource Strain: Not on file  Food Insecurity: Not on file  Transportation Needs: Not on file  Physical Activity: Not on file  Stress: Not on file  Social Connections: Not on file  Intimate Partner Violence: Not on file    Outpatient Medications Prior to Visit  Medication Sig Dispense Refill   acetaminophen (TYLENOL) 500 MG tablet Take 1,000 mg by mouth every  6 (six) hours as needed for moderate pain or headache.     aspirin EC 81 MG tablet Take 1 tablet (81 mg total) by mouth daily. 90 tablet 3   atorvastatin (LIPITOR) 80 MG tablet TAKE 1 TABLET BY MOUTH EVERY DAY 90 tablet 3   calcium carbonate (TUMS - DOSED IN MG ELEMENTAL CALCIUM) 500 MG chewable tablet Chew 2 tablets by mouth daily as needed for indigestion or heartburn.     gabapentin (NEURONTIN) 300 MG capsule Take 600 mg by mouth every evening.      HYDROcodone-acetaminophen (NORCO/VICODIN) 5-325 MG per tablet Take 0.5 tablets by mouth 2 (two) times daily as needed for moderate pain.      Interferon Beta-1b (BETASERON/EXTAVIA) 0.3 MG KIT injection Inject 0.25 mg into the skin every other day.     levocetirizine (XYZAL) 5 MG tablet Take 1 tablet (5 mg total) by mouth every evening. 30 tablet 5   levothyroxine (SYNTHROID) 112 MCG tablet TAKE 1 TABLET BY MOUTH DAILY BEFORE BREAKFAST. 90 tablet 1   mometasone (NASONEX) 50 MCG/ACT nasal spray Place 2 sprays into the nose as needed.     Polyethyl Glycol-Propyl Glycol (SYSTANE ULTRA OP) Place 1 drop into the right eye daily as needed (dry eye).      silver sulfADIAZINE (SILVADENE) 1 % cream Apply daily to irritated areas under abdomen     telmisartan-hydrochlorothiazide (MICARDIS HCT) 40-12.5 MG tablet TAKE 1/2 TABLET BY MOUTH EVERY DAY 45 tablet 1   triamcinolone ointment (KENALOG) 0.1 % Apply daily to affected area     sertraline (ZOLOFT) 50 MG tablet TAKE 1 TABLET BY MOUTH EVERY DAY (Patient not taking: Reported on 05/24/2021) 90 tablet 1   No facility-administered medications prior to visit.    Allergies  Allergen Reactions   Codeine Itching and Nausea And Vomiting   Hydromorphone Hcl Nausea And Vomiting   Ibuprofen Other (See Comments)    stomach ulcers   Morphine Sulfate     "makes my blood pressure bottom out"   Metronidazole Hives, Nausea And Vomiting and Rash    Review of Systems  Constitutional:  Negative for fever and  malaise/fatigue.  HENT:  Negative for congestion.   Eyes:  Negative for blurred vision.  Respiratory:  Negative for cough and shortness of breath.   Cardiovascular:  Negative for chest pain, palpitations and leg swelling.  Gastrointestinal:  Negative for vomiting.  Musculoskeletal:  Negative for back pain.  Skin:  Negative for rash.  Neurological:  Negative for loss of consciousness and headaches.      Objective:    Physical Exam  Vitals and nursing note reviewed.  Constitutional:      General: She is not in acute distress.    Appearance: Normal appearance. She is not ill-appearing.  HENT:     Head: Normocephalic and atraumatic.     Right Ear: External ear normal.     Left Ear: External ear normal.  Eyes:     Extraocular Movements: Extraocular movements intact.     Pupils: Pupils are equal, round, and reactive to light.  Cardiovascular:     Rate and Rhythm: Normal rate and regular rhythm.     Heart sounds: Normal heart sounds. No murmur heard.   No gallop.  Pulmonary:     Effort: Pulmonary effort is normal. No respiratory distress.     Breath sounds: Normal breath sounds. No wheezing or rales.  Skin:    General: Skin is warm and dry.  Neurological:     Mental Status: She is alert and oriented to person, place, and time.  Psychiatric:        Mood and Affect: Mood normal.        Behavior: Behavior normal.        Thought Content: Thought content normal.        Judgment: Judgment normal.    BP 120/70 (BP Location: Right Arm, Patient Position: Sitting, Cuff Size: Large)    Pulse (!) 57    Temp 98.1 F (36.7 C) (Oral)    Resp 18    Ht 5\' 6"  (1.676 m)    Wt 249 lb 3.2 oz (113 kg)    SpO2 99%    BMI 40.22 kg/m  Wt Readings from Last 3 Encounters:  05/24/21 249 lb 3.2 oz (113 kg)  03/23/21 250 lb (113.4 kg)  11/22/20 253 lb 6.4 oz (114.9 kg)       Assessment & Plan:   Problem List Items Addressed This Visit       Unprioritized   HTN (hypertension)   Relevant Orders    Comprehensive metabolic panel   CBC with Differential/Platelet   MULTIPLE SCLEROSIS, RELAPSING/REMITTING   Relevant Medications   traMADol (ULTRAM) 50 MG tablet   Anxiety - Primary    Restart prozac  F/u 3-4 months or sooner prn       Relevant Medications   FLUoxetine (PROZAC) 40 MG capsule   Hyperlipidemia    Encourage heart healthy diet such as MIND or DASH diet, increase exercise, avoid trans fats, simple carbohydrates and processed foods, consider a krill or fish or flaxseed oil cap daily.       Relevant Orders   Lipid panel   Comprehensive metabolic panel   Hypothyroidism    Check labs con't synthroid stable      Relevant Orders   TSH   Other Visit Diagnoses     Need for influenza vaccination       Relevant Orders   Flu Vaccine QUAD High Dose(Fluad) (Completed)   High risk medication use       Relevant Orders   Drug Monitoring Panel [phone], Urine        Meds ordered this encounter  Medications   FLUoxetine (PROZAC) 40 MG capsule    Sig: Take 1 capsule (40 mg total) by mouth daily.    Dispense:  90 capsule    Refill:  3   traMADol (ULTRAM) 50 MG tablet    Sig: Take 1 tablet (50 mg total) by mouth every 8 (eight) hours as needed for up to 5 days.  Dispense:  30 tablet    Refill:  0    I, Dr. Seabron Spates, DO, personally preformed the services described in this documentation.  All medical record entries made by the scribe were at my direction and in my presence.  I have reviewed the chart and discharge instructions (if applicable) and agree that the record reflects my personal performance and is accurate and complete. 05/24/2021   I,Cassandra Rios,acting as a scribe for [name], DO.,have documented all relevant documentation on the behalf of [name], DO,as directed by  [name], DO while in the presence of [name], DO.   [name], DO

## 2021-05-24 NOTE — Assessment & Plan Note (Signed)
Check labs con't synthroid stable

## 2021-05-24 NOTE — Patient Instructions (Signed)

## 2021-05-24 NOTE — Assessment & Plan Note (Signed)
Encourage heart healthy diet such as MIND or DASH diet, increase exercise, avoid trans fats, simple carbohydrates and processed foods, consider a krill or fish or flaxseed oil cap daily.  °

## 2021-05-25 ENCOUNTER — Other Ambulatory Visit: Payer: Self-pay

## 2021-05-25 ENCOUNTER — Ambulatory Visit: Payer: PPO | Admitting: Adult Health

## 2021-05-25 ENCOUNTER — Encounter: Payer: Self-pay | Admitting: Adult Health

## 2021-05-25 VITALS — BP 162/89 | HR 72 | Ht 66.0 in | Wt 250.6 lb

## 2021-05-25 DIAGNOSIS — G35 Multiple sclerosis: Secondary | ICD-10-CM

## 2021-05-25 NOTE — Progress Notes (Signed)
PATIENT: Cassandra Rios DOB: 1951/04/14  REASON FOR VISIT: follow up HISTORY FROM: patient Primary neurologist: Dr. Anne Hahn  HISTORY OF PRESENT ILLNESS: Today 05/25/21:   Cassandra Rios is a 70 year old female with a history of multiple sclerosis.  She returns today for follow-up.  She remains on Betaseron.  Overall she feels that she is doing well.  Denies any new numbness or weakness.  No changes with her gait or balance.  She does have ongoing back pain and uses a walker.  No changes with the bowels or bladder.  No changes with the vision.  Reports that she has received injections for back pain with Dr. Ethelene Hal with a did not give her lasting benefit neck step is ablation of the nerve.  11/27/19: MRI of the brain with and without contrast:  IMPRESSION: This MRI of the brain with and without contrast shows the following: 1.   Multiple T2/FLAIR hyperintense foci in the hemispheres in a pattern and configuration consistent with chronic demyelinating plaque associated with multiple sclerosis.  Some foci could also be due to chronic microvascular ischemic changes.  None of the lesions appear to be acute and they do not enhance.  Compared to the MRI dated 09/21/2017, there is no definite interval change. 2.   The pituitary gland is thinned within a normal sized sella turcica.  This is usually an incidental finding but could also be due to elevated intracranial pressures. 3.   There are no acute findings and there is a normal enhancement pattern.  11/22/20: Cassandra Rios is a 70 year old female with a history of Multiple sclerosis. She returns today for follow-up.  Overall the patient feels that she is doing well.  She remains on Betaseron.  Denies any new numbness or weakness.  Patient states that she does use a walker but this is due to back pain.  Denies any changes with the bowels or bladder.  She does have some issues with constipation due to her pain medication.  No changes with her vision.  She  continues to have low back pain and will be receiving injections by Dr. Ethelene Hal on Thursday  05/19/20: Cassandra Rios is a 70 year old female with a history of multiple sclerosis.  She returns today for follow-up.  She continues on Betaseron.  Reports that she tolerates the medication well other than some bruising at the injection site.  She denies any new numbness or weakness.  Denies any significant changes with her gait or balance.  States that she may get off balance with certain movements.  Denies any changes with the bowels or bladder.  Returns today for evaluation.  11/17/19: Cassandra Rios is a 70 year old female with a history of multiple sclerosis.  She returns today for follow-up.  She remains on Betaseron.  She denies any changes with her gait or balance.  No changes with her vision.  Denies any changes with the bowels or bladder.  She does report new tingling sensation in the lower extremities bilaterally.  She describes this as a pins and needle sensation.  Reports that the sensation comes and goes.  Seems to be worse when she sits down.  She does take gabapentin at bedtime and finds it helpful.  She denies being diabetic although her recent lab work shows elevated glucose level.  I do not see a recent hemoglobin A1c.  She returns today for an evaluation.  HISTORY 05/14/19:   Cassandra Rios is a 70 year old female with a history of multiple sclerosis.  She returns today for follow-up.  She remains on Betaseron.  She denies any new symptoms.  No new numbness or tingling.  No changes with her gait or balance.  No change with the bowels or bladder.  No changes with her vision.  She states that she has been having shortness of breath.  Her cardiologist has ordered a CT of the heart.  She denies any other symptoms.  She returns today for an evaluation.  REVIEW OF SYSTEMS: Out of a complete 14 system review of symptoms, the patient complains only of the following symptoms, and all other reviewed systems are  negative.  See HPI  ALLERGIES: Allergies  Allergen Reactions   Codeine Itching and Nausea And Vomiting   Hydromorphone Hcl Nausea And Vomiting   Ibuprofen Other (See Comments)    stomach ulcers   Morphine Sulfate     "makes my blood pressure bottom out"   Metronidazole Hives, Nausea And Vomiting and Rash    HOME MEDICATIONS: Outpatient Medications Prior to Visit  Medication Sig Dispense Refill   acetaminophen (TYLENOL) 500 MG tablet Take 1,000 mg by mouth every 6 (six) hours as needed for moderate pain or headache.     aspirin EC 81 MG tablet Take 1 tablet (81 mg total) by mouth daily. 90 tablet 3   atorvastatin (LIPITOR) 80 MG tablet TAKE 1 TABLET BY MOUTH EVERY DAY 90 tablet 3   calcium carbonate (TUMS - DOSED IN MG ELEMENTAL CALCIUM) 500 MG chewable tablet Chew 2 tablets by mouth daily as needed for indigestion or heartburn.     FLUoxetine (PROZAC) 40 MG capsule Take 1 capsule (40 mg total) by mouth daily. 90 capsule 3   gabapentin (NEURONTIN) 300 MG capsule Take 600 mg by mouth every evening.      HYDROcodone-acetaminophen (NORCO/VICODIN) 5-325 MG per tablet Take 0.5 tablets by mouth 2 (two) times daily as needed for moderate pain.      Interferon Beta-1b (BETASERON/EXTAVIA) 0.3 MG KIT injection Inject 0.25 mg into the skin every other day.     levocetirizine (XYZAL) 5 MG tablet Take 1 tablet (5 mg total) by mouth every evening. 30 tablet 5   levothyroxine (SYNTHROID) 112 MCG tablet TAKE 1 TABLET BY MOUTH DAILY BEFORE BREAKFAST. 90 tablet 1   mometasone (NASONEX) 50 MCG/ACT nasal spray Place 2 sprays into the nose as needed.     Polyethyl Glycol-Propyl Glycol (SYSTANE ULTRA OP) Place 1 drop into the right eye daily as needed (dry eye).      silver sulfADIAZINE (SILVADENE) 1 % cream Apply daily to irritated areas under abdomen     telmisartan-hydrochlorothiazide (MICARDIS HCT) 40-12.5 MG tablet TAKE 1/2 TABLET BY MOUTH EVERY DAY 45 tablet 1   traMADol (ULTRAM) 50 MG tablet Take 1  tablet (50 mg total) by mouth every 8 (eight) hours as needed for up to 5 days. 30 tablet 0   triamcinolone ointment (KENALOG) 0.1 % Apply daily to affected area     No facility-administered medications prior to visit.    PAST MEDICAL HISTORY: Past Medical History:  Diagnosis Date   AAA (abdominal aortic aneurysm)    Allergic rhinitis    Chronic hoarseness    Colon polyps    COPD (chronic obstructive pulmonary disease) (HCC)    Dr. Marchelle Gearing   Dyslipidemia    Hypertension    Hypothyroidism    MS (multiple sclerosis) (HCC)    Obesity    Stomach ulcer from aspirin/ibuprofen-like drugs (NSAID's)     PAST  SURGICAL HISTORY: Past Surgical History:  Procedure Laterality Date   ABDOMINAL HYSTERECTOMY     BARIATRIC SURGERY  09/07/08   lap band   CARPAL TUNNEL RELEASE     CATARACT EXTRACTION Right 11/08/2017   CATARACT EXTRACTION Left 11/22/2017   CATARACT EXTRACTION, BILATERAL      Toric lenses for astigmatism   CHOLECYSTECTOMY     CHOLECYSTECTOMY     HIATAL HERNIA REPAIR  09/07/08   LEFT HEART CATH AND CORONARY ANGIOGRAPHY N/A 07/09/2019   Procedure: LEFT HEART CATH AND CORONARY ANGIOGRAPHY;  Surgeon: Lyn Records, MD;  Location: [facility];  Service: Cardiovascular;  Laterality: N/A;    FAMILY HISTORY: Family History  Problem Relation Age of Onset   Stroke Mother 63   Heart disease Mother        congenital heart defect?   COPD Father    Heart disease Father    AAA (abdominal aortic aneurysm) Father    COPD Sister    Arthritis Sister    AAA (abdominal aortic aneurysm) Sister    Dementia Sister    Other Sister        brain injury   Multiple sclerosis Neg Hx     SOCIAL HISTORY: Social History   Socioeconomic History   Marital status: Married    Spouse name: Casimiro Needle   Number of children: 2   Years of education: 16   Highest education level: Not on file  Occupational History   Occupation: retired  Tobacco Use   Smoking status: Former    Packs/day: 2.00     Years: 40.00    Pack years: 80.00    Types: Cigarettes    Quit date: 04/05/2007    Years since quitting: 14.1   Smokeless tobacco: Never  Vaping Use   Vaping Use: Not on file  Substance and Sexual Activity   Alcohol use: No   Drug use: No   Sexual activity: Never    Partners: Male  Other Topics Concern   Not on file  Social History Narrative   Patient is right handed,reside in home with husband   Social Determinants of Health   Financial Resource Strain: Not on file  Food Insecurity: Not on file  Transportation Needs: Not on file  Physical Activity: Not on file  Stress: Not on file  Social Connections: Not on file  Intimate Partner Violence: Not on file      PHYSICAL EXAM  Vitals:   05/25/21 0845  BP: (!) 162/89  Pulse: 72  Weight: 250 lb 9.6 oz (113.7 kg)  Height: 5\' 6"  (1.676 m)   Body mass index is 40.45 kg/m.  Generalized: Well developed, in no acute distress   Neurological examination  Mentation: Alert oriented to time, place, history taking. Follows all commands speech and language fluent Cranial nerve II-XII: Pupils were equal round reactive to light.  Head turning and shoulder shrug  were normal and symmetric. Motor: The motor testing reveals 5 over 5 strength of all 4 extremities. Good symmetric motor tone is noted throughout.  Sensory: Sensory testing is intact to soft touch on all 4 extremities. No evidence of extinction is noted.  Coordination: Cerebellar testing reveals good finger-nose-finger and heel-to-shin bilaterally.  Gait and station: Patient uses a walker when ambulating.   DIAGNOSTIC DATA (LABS, IMAGING, TESTING) - I reviewed patient records, labs, notes, testing and imaging myself where available.  Lab Results  Component Value Date   WBC 5.6 05/24/2021   HGB 13.9 05/24/2021  HCT 41.9 05/24/2021   MCV 88.6 05/24/2021   PLT 126.0 (L) 05/24/2021      Component Value Date/Time   NA 137 05/24/2021 1019   NA 138 07/03/2019 1515    K 4.5 05/24/2021 1019   CL 101 05/24/2021 1019   CO2 29 05/24/2021 1019   GLUCOSE 98 05/24/2021 1019   BUN 11 05/24/2021 1019   BUN 12 07/03/2019 1515   CREATININE 0.78 05/24/2021 1019   CREATININE 1.04 05/13/2014 0840   CALCIUM 9.4 05/24/2021 1019   PROT 6.6 05/24/2021 1019   PROT 6.2 08/26/2019 0731   ALBUMIN 3.8 05/24/2021 1019   ALBUMIN 3.9 08/26/2019 0731   AST 30 05/24/2021 1019   ALT 22 05/24/2021 1019   ALKPHOS 73 05/24/2021 1019   BILITOT 0.7 05/24/2021 1019   BILITOT 0.3 08/26/2019 0731   GFRNONAA 58 (L) 07/03/2019 1515   GFRNONAA 57 (L) 05/13/2014 0840   GFRAA 67 07/03/2019 1515   GFRAA 66 05/13/2014 0840   Lab Results  Component Value Date   CHOL 142 05/24/2021   HDL 42.00 05/24/2021   LDLCALC 71 05/24/2021   TRIG 145.0 05/24/2021   CHOLHDL 3 05/24/2021   Lab Results  Component Value Date   HGBA1C 5.9 09/25/2016   Lab Results  Component Value Date   VITAMINB12 648 03/16/2010   Lab Results  Component Value Date   TSH 1.04 05/24/2021      ASSESSMENT AND PLAN 70 y.o. year old female  has a past medical history of AAA (abdominal aortic aneurysm), Allergic rhinitis, Chronic hoarseness, Colon polyps, COPD (chronic obstructive pulmonary disease) (HCC), Dyslipidemia, Hypertension, Hypothyroidism, MS (multiple sclerosis) (HCC), Obesity, and Stomach ulcer from aspirin/ibuprofen-like drugs (NSAID's). here with:  Multiple sclerosis  Continue Betaseron Liver completed yesterday with her PCP Consider repeating MRI of the brain at the next visit Patient will be established with Dr. Marjory Lies upon Dr. Clarisa Kindred retirement FU 6 months are sooner if needed  Advised patient if her symptoms worsen or she develops new symptoms she should let us know follow-up in 6 months or sooner if needed    Butch Penny, MSN, NP-C 05/25/2021, 9:12 AM [facility] [address], [address] [place], [place] [address] [phone]

## 2021-05-25 NOTE — Patient Instructions (Addendum)
Your Plan:  Continue Betaseron  If your symptoms worsen or you develop new symptoms please let us know.   Thank you for coming to see [name] at [facility]. I hope we have been able to provide you high quality care today.  You may receive a patient satisfaction survey over the next few weeks. We would appreciate your feedback and comments so that we may continue to improve ourselves and the health of our patients.

## 2021-05-25 NOTE — Telephone Encounter (Signed)
[employer] PAP form completed fax with confirmation received [phone]. (BETASERON).

## 2021-05-25 NOTE — Progress Notes (Signed)
I reviewed note and agree with plan.   Penni Bombard, MD 15/61/5379, 4:32 PM Certified in Neurology, Neurophysiology and Neuroimaging  [facility] [address] [place], [place] [address] [phone]

## 2021-05-26 LAB — DRUG MONITORING PANEL 375977 , URINE
Alcohol Metabolites: NEGATIVE ng/mL (ref <500)
Amphetamines: NEGATIVE ng/mL (ref <500)
Barbiturates: NEGATIVE ng/mL (ref <300)
Benzodiazepines: NEGATIVE ng/mL (ref <100)
Cocaine Metabolite: NEGATIVE ng/mL (ref <150)
Codeine: NEGATIVE ng/mL (ref <50)
Desmethyltramadol: NEGATIVE ng/mL (ref <100)
Hydrocodone: 225 ng/mL — ABNORMAL HIGH (ref <50)
Hydromorphone: 64 ng/mL — ABNORMAL HIGH (ref <50)
Marijuana Metabolite: NEGATIVE ng/mL (ref <20)
Morphine: NEGATIVE ng/mL (ref <50)
Norhydrocodone: 350 ng/mL — ABNORMAL HIGH (ref <50)
Opiates: POSITIVE ng/mL — AB (ref <100)
Oxycodone: NEGATIVE ng/mL (ref <100)
Tramadol: NEGATIVE ng/mL (ref <100)

## 2021-05-26 LAB — DM TEMPLATE

## 2021-05-31 ENCOUNTER — Other Ambulatory Visit: Payer: Self-pay | Admitting: Acute Care

## 2021-05-31 DIAGNOSIS — Z87891 Personal history of nicotine dependence: Secondary | ICD-10-CM

## 2021-06-01 NOTE — Telephone Encounter (Signed)
Received fax from [employer] [name] [employer] requesting missing pt info: Traditional Medicare ID. I faxed a copy of the pt's Medicare ID card to [name] @ the number provided: [phone]. Received a receipt of confirmation. Of note, the pt's case number is [id number].

## 2021-06-09 NOTE — Telephone Encounter (Signed)
I called pt and could not LM.  I did fax the required Medicare card to [place].

## 2021-06-09 NOTE — Telephone Encounter (Signed)
I called pt and let her know that I did fax to them the [facility] card infor they were asking for.  It may be they need to look up financial information??  Social security # does that as well.

## 2021-06-09 NOTE — Telephone Encounter (Signed)
Pt is asking for a call from [place], [place] re: the paperwork needed for her [place].  Pt is wanting to bring the letter she has received from [place] along with her medicare card that [place] is asking for a copy of.  Please call pt.

## 2021-06-10 ENCOUNTER — Telehealth: Payer: Self-pay | Admitting: Family Medicine

## 2021-06-10 NOTE — Telephone Encounter (Signed)
Left message for patient to call back and schedule Medicare Annual Wellness Visit (AWV) in office.  ° °If not able to come in office, please offer to do virtually or by telephone.  Left office number and my jabber #[phone]. ° °Due for AWVI ° °Please schedule at anytime with Nurse Health Advisor. °  °

## 2021-06-14 ENCOUNTER — Ambulatory Visit
Admission: RE | Admit: 2021-06-14 | Discharge: 2021-06-14 | Disposition: A | Discharge status: Home / Self Care | Payer: PPO | Source: Ambulatory Visit | Attending: Family Medicine | Admitting: Family Medicine

## 2021-06-14 DIAGNOSIS — Z1231 Encounter for screening mammogram for malignant neoplasm of breast: Secondary | ICD-10-CM

## 2021-06-15 ENCOUNTER — Other Ambulatory Visit: Payer: Self-pay

## 2021-06-15 ENCOUNTER — Ambulatory Visit (INDEPENDENT_AMBULATORY_CARE_PROVIDER_SITE_OTHER)
Admission: RE | Admit: 2021-06-15 | Discharge: 2021-06-15 | Disposition: A | Discharge status: Home / Self Care | Payer: PPO | Source: Ambulatory Visit | Attending: Acute Care | Admitting: Acute Care

## 2021-06-15 DIAGNOSIS — Z87891 Personal history of nicotine dependence: Secondary | ICD-10-CM | POA: Diagnosis not present

## 2021-06-16 ENCOUNTER — Other Ambulatory Visit: Payer: Self-pay | Admitting: Acute Care

## 2021-06-16 DIAGNOSIS — Z87891 Personal history of nicotine dependence: Secondary | ICD-10-CM

## 2021-06-23 DIAGNOSIS — M47816 Spondylosis without myelopathy or radiculopathy, lumbar region: Secondary | ICD-10-CM | POA: Diagnosis not present

## 2021-06-23 DIAGNOSIS — M47896 Other spondylosis, lumbar region: Secondary | ICD-10-CM | POA: Diagnosis not present

## 2021-06-24 ENCOUNTER — Telehealth: Payer: Self-pay | Admitting: Acute Care

## 2021-06-24 NOTE — Telephone Encounter (Signed)
Called patient back and she was wanting some clarification on her CT scan on her lungs.   What are the sizes of the nodules that are in her lungs What lobe(s) are they in? What large vessels are they talking about and where are they in the chest? What is the end date of her study?  Please advise Cassandra Rios

## 2021-06-24 NOTE — Telephone Encounter (Signed)
Called patient and left voicemail for her to call office back for ct scan clarification

## 2021-06-27 NOTE — Telephone Encounter (Signed)
Called and spoke with patient.  Cassandra Roch, NP results given.  Patient is concerned and ants to know about location of lung nodule, if nodule has changed size since last CT, if she no longer needs at CT after 03/2022, why is she ordered one past that date, and will her insurance cover another lung screening CT 06/2022? Patient stated if she does not answer to leave message on answering machine.   Message routed to Cassandra Roch, NP to advise

## 2021-06-28 NOTE — Telephone Encounter (Signed)
Called and spoke with patient.  Patient scheduled televisit 07/04/21 at 1000 to review CT results and answer questions. Nothing further at this time.

## 2021-07-01 DIAGNOSIS — M5136 Other intervertebral disc degeneration, lumbar region: Secondary | ICD-10-CM | POA: Diagnosis not present

## 2021-07-01 DIAGNOSIS — Z6839 Body mass index (BMI) 39.0-39.9, adult: Secondary | ICD-10-CM | POA: Diagnosis not present

## 2021-07-01 DIAGNOSIS — I10 Essential (primary) hypertension: Secondary | ICD-10-CM | POA: Diagnosis not present

## 2021-07-04 ENCOUNTER — Encounter: Payer: Self-pay | Admitting: Acute Care

## 2021-07-04 ENCOUNTER — Ambulatory Visit (INDEPENDENT_AMBULATORY_CARE_PROVIDER_SITE_OTHER): Payer: PPO | Admitting: Acute Care

## 2021-07-04 ENCOUNTER — Other Ambulatory Visit: Payer: Self-pay

## 2021-07-04 DIAGNOSIS — Z122 Encounter for screening for malignant neoplasm of respiratory organs: Secondary | ICD-10-CM

## 2021-07-04 NOTE — Patient Instructions (Addendum)
It was good to speak with you today. I have sent a Staff message  to Dr. Yetta Glassman, radiology,  to addend the reading of your Low Dose CT Chest  to include specific lobes of the lungs . I have already given you the specifics of the sizes of the nodules. .  We will mail you a copy of the addended read once we have access to it.   Continue to follow up with Dr. Tamala Julian regarding CAD, as you are doing.  Continue Aggressive statin therapy per Dr. Tamala Julian As you have quit smoking  for 15 years we cannot scan you through the screening program.  Your PCP can order an annual CT Chest without contrast for additional monitoring per your request outside of the screening program.  Follow up as you feel you need us/ pulmonary. Please contact office for sooner follow up if symptoms do not improve or worsen or seek emergency care

## 2021-07-04 NOTE — Progress Notes (Signed)
Virtual Visit via Telephone Note  I connected with Cassandra Rios on 07/04/21 at 10:00 AM EST by telephone and verified that I am speaking with the correct person using two identifiers.  Location: Patient:  At home Provider: [place], [place], [place], [address]    I discussed the limitations, risks, security and privacy concerns of performing an evaluation and management service by telephone and the availability of in person appointments. I also discussed with the patient that there may be a patient responsible charge related to this service. The patient expressed understanding and agreed to proceed.  Synopsis: Pt. Is a lung cancer screening patient. 80 pack year smoking history , quit 2008 so 2023 is her last scan.( 15 years quit per the National guidelines). Her screening results were called to her , but she has very specific questions that she was requesting be answered. This is the reason for the tele visit.    History of Present Illness: Pt had her Low Dose CT Chest for lung cancer screening done 06/15/2021. She has several questions about very specific locations of these nodules. Unfortunately these specifics are not in the reading,  She did have one nodule was 4.8 mm , and several others that were smaller. The scan was read as a Lung RADS 2: nodules that are benign in appearance and behavior with a very low likelihood of becoming a clinically active cancer due to size or lack of growth. Recommendation per radiology is for a repeat LDCT in 12 months. . She has quit x 15 years, so this is her last scan. She will be asking her PCP to scan her annually with a CT Chest without contrast.   She asked for very specific information about the coronary arteries, and I explained that this is not a gated exam, and that the cath she had in 2022 would be the best estimate of her CAD.  Observations/Objective: Pt. In NAD, she is speaking in  full sentences.   Assessment and Plan: Pt.  Requesting very specific Information about the pulmonary nodules noted on her screening scan.  All nodules noted on the scan are not new.  CAD noted per scan>> She is followed by Dr. Tamala Julian and she is on aggressive statin therapy Plan Staff message sent to Dr. Yetta Glassman to addend the reading to include specific lobes of the lungs in addition to the specific sizes ( that I have already provided the patient with) .  We will mail a copy of the addended read once we have access to it.   Continue to follow up with Dr. Tamala Julian regarding CAD.  Continue Aggressive statin therapy As you have quit  for 15 years we cannot scan you through the screening program, but your PCP can order CT Chest without contrast for additional monitoring per your request.    Follow Up Instructions: Follow up with Dr. Chase Caller as needed.    I discussed the assessment and treatment plan with the patient. The patient was provided an opportunity to ask questions and all were answered. The patient agreed with the plan and demonstrated an understanding of the instructions.   The patient was advised to call back or seek an in-person evaluation if the symptoms worsen or if the condition fails to improve as anticipated.  I provided 35 minutes of non-face-to-face time during this encounter.   Magdalen Spatz, NP  07/04/2021

## 2021-07-13 ENCOUNTER — Telehealth: Payer: Self-pay

## 2021-07-13 NOTE — Telephone Encounter (Signed)
Patient has been approved to receive Betaseron through patient assistance program from 07/12/2021 07/12/2022.

## 2021-07-14 ENCOUNTER — Encounter: Payer: Self-pay | Admitting: *Deleted

## 2021-07-19 ENCOUNTER — Other Ambulatory Visit: Payer: Self-pay | Admitting: Family Medicine

## 2021-07-19 ENCOUNTER — Other Ambulatory Visit: Payer: Self-pay | Admitting: Interventional Cardiology

## 2021-08-25 ENCOUNTER — Other Ambulatory Visit: Payer: Self-pay

## 2021-08-25 ENCOUNTER — Ambulatory Visit (HOSPITAL_COMMUNITY)
Admission: EM | Admit: 2021-08-25 | Discharge: 2021-08-25 | Disposition: A | Discharge status: Home / Self Care | Payer: PPO | Attending: Emergency Medicine | Admitting: Emergency Medicine

## 2021-08-25 ENCOUNTER — Encounter (HOSPITAL_COMMUNITY): Payer: Self-pay | Admitting: Emergency Medicine

## 2021-08-25 DIAGNOSIS — J029 Acute pharyngitis, unspecified: Secondary | ICD-10-CM | POA: Diagnosis not present

## 2021-08-25 DIAGNOSIS — J069 Acute upper respiratory infection, unspecified: Secondary | ICD-10-CM | POA: Diagnosis not present

## 2021-08-25 DIAGNOSIS — R0981 Nasal congestion: Secondary | ICD-10-CM

## 2021-08-25 MED ORDER — AMOXICILLIN-POT CLAVULANATE 875-125 MG PO TABS: 1.0000 | ORAL_TABLET | Freq: Two times a day (BID) | ORAL | 0 refills | Status: DC

## 2021-08-25 MED ORDER — LEVOCETIRIZINE DIHYDROCHLORIDE 5 MG PO TABS: 5.0000 mg | ORAL_TABLET | Freq: Every evening | ORAL | 5 refills | Status: DC

## 2021-08-25 NOTE — Discharge Instructions (Signed)
Your symptoms today may be being caused by your allergies however due to the length that you have been experiencing symptoms and your upcoming procedure we will cover for bacteria ? ?Take Augmentin twice daily for the next 7 days, if bacteria is present you will start to see improvement with your symptoms in the next 2 to 3 days and then it should progressively get better ? ?If symptoms continue to persist past use of antibiotic then they are most likely being caused by your allergies ? ?You may continue taking daily nasal spray to help clear sinus passages ? ?You may also continue use of Xyzal daily to further help reduce secretions being produced ? ?For persisting symptoms you may follow-up with urgent care or with your primary doctor for further evaluation as needed ?

## 2021-08-25 NOTE — ED Provider Notes (Signed)
[facility]    CSN: [id number] Arrival date & time: 08/25/21  1027      History   Chief Complaint Chief Complaint  Patient presents with   Nasal Congestion   Sore Throat    HPI Cassandra Rios is a 71 y.o. female.   Patient presents with bilateral ear fullness, sinus pressure, nasal congestion, rhinorrhea, sore throat and productive cough for 2 weeks.  Initially thought that it was her allergies therefore she has been using an antihistamine which she recently ran out of and nasal spray for management which has been somewhat helpful.  Has upcoming procedure and wants to rule out infection.  Denies fevers, chills, body aches, shortness of breath, wheezing, chest pain or tightness.  No known sick contacts.  Tolerating food and liquids.  Extensive medical history.  Former smoker.  Past Medical History:  Diagnosis Date   AAA (abdominal aortic aneurysm)    Allergic rhinitis    Chronic hoarseness    Colon polyps    COPD (chronic obstructive pulmonary disease) (HCC)    Dr. Marchelle Gearing   Dyslipidemia    Hypertension    Hypothyroidism    MS (multiple sclerosis) (HCC)    Obesity    Stomach ulcer from aspirin/ibuprofen-like drugs (NSAID's)     Patient Active Problem List   Diagnosis Date Noted   COVID-19 02/22/2021   COPD (chronic obstructive pulmonary disease) (HCC)    Chronic midline low back pain without sciatica 04/20/2020   Cellulitis of breast 02/03/2020   Flank pain 02/03/2020   CAD (coronary artery disease), native coronary artery 07/09/2019   DOE (dyspnea on exertion) 04/21/2019   Preventative health care 04/21/2019   AAA (abdominal aortic aneurysm) 04/19/2018   Chronic headaches 04/19/2018   Lichen sclerosus et atrophicus 03/31/2018   Sinus headache 03/18/2018   Closed fracture of right distal fibula 12/29/2017   Ankle fracture 12/29/2017   Osteoarthritis of acromioclavicular joint 11/05/2017   Impingement syndrome of left shoulder region 11/05/2017    Hyperlipidemia LDL goal <100 09/27/2017   Senile purpura (HCC) 03/29/2017   Lichen sclerosus of female genitalia 10/23/2016   Breast pain, left 03/27/2016   Chest pain 03/27/2016   HTN (hypertension) 09/24/2015   Sinusitis, acute 03/23/2015   Depression 07/06/2014   Thoracic back pain 07/06/2014   UTI (urinary tract infection) 04/28/2014   Cellulitis and abscess of trunk 02/16/2014   Severe obesity (BMI >= 40) (HCC) 06/30/2013   Breast mass, right 01/10/2013   Acute sinusitis 03/22/2009   MULTIPLE SCLEROSIS, RELAPSING/REMITTING 01/04/2009   THROMBOCYTOPENIA 09/15/2008   SHOULDER PAIN, LEFT 09/15/2008   BARIATRIC SURGERY STATUS 09/15/2008   Hypothyroidism 07/20/2008   HIATAL HERNIA 07/20/2008   COPD with acute bronchitis (HCC) 07/23/2007   ABNORMAL PULMONARY TEST RESULTS 07/23/2007   Allergic rhinitis 06/26/2007   HOARSENESS, CHRONIC 06/26/2007   WHEEZING 06/26/2007   SNORING 06/26/2007   COUGH 06/26/2007   TOBACCO ABUSE-HISTORY OF 06/26/2007   COLONIC POLYPS, HYPERPLASTIC 06/25/2007   Hyperlipidemia 06/25/2007   MORBID OBESITY 06/25/2007   Depression with anxiety 06/25/2007   GASTRIC ULCER 06/25/2007   COLONIC POLYPS, ADENOMATOUS, HX OF 06/25/2007   Anxiety 06/25/2007    Past Surgical History:  Procedure Laterality Date   ABDOMINAL HYSTERECTOMY     BARIATRIC SURGERY  09/07/08   lap band   CARPAL TUNNEL RELEASE     CATARACT EXTRACTION Right 11/08/2017   CATARACT EXTRACTION Left 11/22/2017   CATARACT EXTRACTION, BILATERAL      Toric lenses for  astigmatism   CHOLECYSTECTOMY     CHOLECYSTECTOMY     HIATAL HERNIA REPAIR  09/07/08   LEFT HEART CATH AND CORONARY ANGIOGRAPHY N/A 07/09/2019   Procedure: LEFT HEART CATH AND CORONARY ANGIOGRAPHY;  Surgeon: Lyn Records, MD;  Location: [facility];  Service: Cardiovascular;  Laterality: N/A;    OB History     Gravida  3   Para  2   Term  2   Preterm      AB      Living         SAB      IAB      Ectopic       Multiple      Live Births  2            Home Medications    Prior to Admission medications   Medication Sig Start Date End Date Taking? Authorizing Provider  acetaminophen (TYLENOL) 500 MG tablet Take 1,000 mg by mouth every 6 (six) hours as needed for moderate pain or headache.    [provider]  aspirin EC 81 MG tablet Take 1 tablet (81 mg total) by mouth daily. 07/03/19   Leone Brand, NP  atorvastatin (LIPITOR) 80 MG tablet TAKE 1 TABLET BY MOUTH EVERY DAY 07/19/21   Lyn Records, MD  calcium carbonate (TUMS - DOSED IN MG ELEMENTAL CALCIUM) 500 MG chewable tablet Chew 2 tablets by mouth daily as needed for indigestion or heartburn.    [provider]  FLUoxetine (PROZAC) 40 MG capsule Take 1 capsule (40 mg total) by mouth daily. 05/24/21   [name], DO  gabapentin (NEURONTIN) 300 MG capsule Take 600 mg by mouth every evening.  09/19/18   [provider]  HYDROcodone-acetaminophen (NORCO/VICODIN) 5-325 MG per tablet Take 0.5 tablets by mouth 2 (two) times daily as needed for moderate pain.     [provider]  Interferon Beta-1b (BETASERON/EXTAVIA) 0.3 MG KIT injection Inject 0.25 mg into the skin every other day.    [provider]  levocetirizine (XYZAL) 5 MG tablet Take 1 tablet (5 mg total) by mouth every evening. 04/16/20   Bing Neighbors, FNP  levothyroxine (SYNTHROID) 112 MCG tablet TAKE 1 TABLET BY MOUTH EVERY DAY BEFORE BREAKFAST 07/19/21   Zola Button, Yvonne R, DO  mometasone (NASONEX) 50 MCG/ACT nasal spray Place 2 sprays into the nose as needed.    [provider]  Polyethyl Glycol-Propyl Glycol (SYSTANE ULTRA OP) Place 1 drop into the right eye daily as needed (dry eye).     [provider]  silver sulfADIAZINE (SILVADENE) 1 % cream Apply daily to irritated areas under abdomen 09/03/20   [provider]  telmisartan-hydrochlorothiazide (MICARDIS HCT) 40-12.5 MG tablet TAKE 1/2  TABLET BY MOUTH EVERY DAY 02/28/21   Lyn Records, MD  triamcinolone ointment (KENALOG) 0.1 % Apply daily to affected area 09/03/20   [provider]    Family History Family History  Problem Relation Age of Onset   Stroke Mother 2   Heart disease Mother        congenital heart defect?   COPD Father    Heart disease Father    AAA (abdominal aortic aneurysm) Father    COPD Sister    Arthritis Sister    AAA (abdominal aortic aneurysm) Sister    Dementia Sister    Other Sister        brain injury   Multiple sclerosis  Neg Hx     Social History Social History   Tobacco Use   Smoking status: Former    Packs/day: 2.00    Years: 40.00    Pack years: 80.00    Types: Cigarettes    Quit date: 04/05/2007    Years since quitting: 14.4   Smokeless tobacco: Never  Substance Use Topics   Alcohol use: No   Drug use: No     Allergies   Codeine, Hydromorphone hcl, Ibuprofen, Morphine sulfate, and Metronidazole   Review of Systems Review of Systems  Constitutional: Negative.   HENT:  Positive for congestion, ear pain, rhinorrhea, sinus pressure and sore throat. Negative for dental problem, drooling, ear discharge, facial swelling, hearing loss, mouth sores, nosebleeds, sinus pain, sneezing, tinnitus, trouble swallowing and voice change.   Respiratory: Negative.    Cardiovascular: Negative.   Gastrointestinal: Negative.   Neurological: Negative.     Physical Exam Triage Vital Signs ED Triage Vitals  Enc Vitals Group     BP 08/25/21 1109 117/77     Pulse Rate 08/25/21 1109 85     Resp 08/25/21 1109 19     Temp 08/25/21 1109 98.7 F (37.1 C)     Temp Source 08/25/21 1109 Oral     SpO2 08/25/21 1109 98 %     Weight --      Height --      Head Circumference --      Peak Flow --      Pain Score 08/25/21 1107 0     Pain Loc --      Pain Edu? --      Excl. in GC? --    No data found.  Updated Vital Signs BP 117/77 (BP Location: Right Arm)   Pulse 85   Temp  98.7 F (37.1 C) (Oral)   Resp 19   SpO2 98%   Visual Acuity Right Eye Distance:   Left Eye Distance:   Bilateral Distance:    Right Eye Near:   Left Eye Near:    Bilateral Near:     Physical Exam Constitutional:      Appearance: Normal appearance.  HENT:     Head: Normocephalic.     Right Ear: Ear canal and external ear normal. A middle ear effusion is present.     Left Ear: Ear canal and external ear normal. A middle ear effusion is present.     Mouth/Throat:     Mouth: Mucous membranes are moist.     Pharynx: Posterior oropharyngeal erythema present.     Tonsils: No tonsillar exudate. 0 on the right. 0 on the left.  Eyes:     Extraocular Movements: Extraocular movements intact.  Cardiovascular:     Rate and Rhythm: Normal rate and regular rhythm.     Pulses: Normal pulses.     Heart sounds: Normal heart sounds.  Pulmonary:     Effort: Pulmonary effort is normal.     Breath sounds: Normal breath sounds.  Skin:    General: Skin is warm and dry.  Neurological:     Mental Status: She is alert and oriented to person, place, and time. Mental status is at baseline.  Psychiatric:        Mood and Affect: Mood normal.        Behavior: Behavior normal.     UC Treatments / Results  Labs (all labs ordered are listed, but only abnormal results are displayed) Labs Reviewed - No data to display  EKG   Radiology No results found.  Procedures Procedures (including critical care time)  Medications Ordered in UC Medications - No data to display  Initial Impression / Assessment and Plan / UC Course  I have reviewed the triage vital signs and the nursing notes.  Pertinent labs & imaging results that were available during my care of the patient were reviewed by me and considered in my medical decision making (see chart for details).  Nasal congestion Sore throat  Due to timeline of illness will defer viral testing at this time, will cover for bacteria due to timeline  of illness as well, Augmentin 7-day course prescribed and refill resolved per patient request, advised patient to continue use of nasal spray, may attempt use of additional over-the-counter medications for supportive treatment, urgent care or primary care follow-up as needed Final Clinical Impressions(s) / UC Diagnoses   Final diagnoses:  None   Discharge Instructions   None    ED Prescriptions   None    PDMP not reviewed this encounter.   Valinda Hoar, NP 08/25/21 1204

## 2021-08-25 NOTE — ED Triage Notes (Signed)
Pt reports having head congestion, sore throat and getting up phlegm for about 2 weeks. Pt believes might be allergy related. Denies any fevers.  ?

## 2021-09-02 DIAGNOSIS — M47896 Other spondylosis, lumbar region: Secondary | ICD-10-CM | POA: Diagnosis not present

## 2021-09-02 DIAGNOSIS — M47816 Spondylosis without myelopathy or radiculopathy, lumbar region: Secondary | ICD-10-CM | POA: Diagnosis not present

## 2021-09-18 NOTE — Progress Notes (Signed)
?Cardiology Office Note:   ? ?Date:  09/21/2021  ? ?ID:  Cassandra Rios, DOB 05-10-1951, MRN [id number] ? ?PCP:  [name], DO  ?Cardiologist:  Lesleigh Noe, MD  ? ?Referring MD: Zola Button, Grayling Congress, *  ? ?Chief Complaint  ?Patient presents with  ? Chest Pain  ? Advice Only  ?  Back pain  ? ? ?History of Present Illness:   ? ?Cassandra Rios is a 71 y.o. female with a hx of AAA 4.3 cm, right bundle branch block, exertional dyspnea, chest discomfort, diaphoresis, three-vessel coronary calcification by CT 2019, coronary angiography after coronary CT demonstrating 70% LAD and circumflex lesions February 2021 and near syncope. ? ? ?She has been having left parasternal discomfort that radiates down near the subcostal margin.  It occurs spontaneously and is not precipitated by activity.  Simultaneously she has lower left mid scapular discomfort.  She has tried nitroglycerin for the discomfort without improvement. ? ?Has potential upcoming stent graft procedure.  7 mild procedures done on her back. ? ?Past Medical History:  ?Diagnosis Date  ? AAA (abdominal aortic aneurysm) (HCC)   ? Allergic rhinitis   ? Chronic hoarseness   ? Colon polyps   ? COPD (chronic obstructive pulmonary disease) (HCC)   ? Dr. Marchelle Gearing  ? Dyslipidemia   ? Hypertension   ? Hypothyroidism   ? MS (multiple sclerosis) (HCC)   ? Obesity   ? Stomach ulcer from aspirin/ibuprofen-like drugs (NSAID's)   ? ? ?Past Surgical History:  ?Procedure Laterality Date  ? ABDOMINAL HYSTERECTOMY    ? BARIATRIC SURGERY  09/07/08  ? lap band  ? CARPAL TUNNEL RELEASE    ? CATARACT EXTRACTION Right 11/08/2017  ? CATARACT EXTRACTION Left 11/22/2017  ? CATARACT EXTRACTION, BILATERAL    ?  Toric lenses for astigmatism  ? CHOLECYSTECTOMY    ? CHOLECYSTECTOMY    ? HIATAL HERNIA REPAIR  09/07/08  ? LEFT HEART CATH AND CORONARY ANGIOGRAPHY N/A 07/09/2019  ? Procedure: LEFT HEART CATH AND CORONARY ANGIOGRAPHY;  Surgeon: Lyn Records, MD;  Location: [facility]  INVASIVE CV LAB;  Service: Cardiovascular;  Laterality: N/A;  ? ? ?Current Medications: ?Current Meds  ?Medication Sig  ? acetaminophen (TYLENOL) 500 MG tablet Take 1,000 mg by mouth every 6 (six) hours as needed for moderate pain or headache.  ? amoxicillin-clavulanate (AUGMENTIN) 875-125 MG tablet Take 1 tablet by mouth every 12 (twelve) hours.  ? aspirin EC 81 MG tablet Take 1 tablet (81 mg total) by mouth daily.  ? atorvastatin (LIPITOR) 80 MG tablet TAKE 1 TABLET BY MOUTH EVERY DAY  ? calcium carbonate (TUMS - DOSED IN MG ELEMENTAL CALCIUM) 500 MG chewable tablet Chew 2 tablets by mouth daily as needed for indigestion or heartburn.  ? FLUoxetine (PROZAC) 40 MG capsule Take 1 capsule (40 mg total) by mouth daily.  ? gabapentin (NEURONTIN) 300 MG capsule Take 600 mg by mouth every evening.   ? HYDROcodone-acetaminophen (NORCO/VICODIN) 5-325 MG per tablet Take 0.5 tablets by mouth 2 (two) times daily as needed for moderate pain.   ? Interferon Beta-1b (BETASERON/EXTAVIA) 0.3 MG KIT injection Inject 0.25 mg into the skin every other day.  ? levocetirizine (XYZAL) 5 MG tablet Take 1 tablet (5 mg total) by mouth every evening.  ? levothyroxine (SYNTHROID) 112 MCG tablet TAKE 1 TABLET BY MOUTH EVERY DAY BEFORE BREAKFAST  ? mometasone (NASONEX) 50 MCG/ACT nasal spray Place 2 sprays into the nose as needed.  ?  Polyethyl Glycol-Propyl Glycol (SYSTANE ULTRA OP) Place 1 drop into the right eye daily as needed (dry eye).   ? silver sulfADIAZINE (SILVADENE) 1 % cream Apply daily to irritated areas under abdomen  ? telmisartan-hydrochlorothiazide (MICARDIS HCT) 40-12.5 MG tablet TAKE 1/2 TABLET BY MOUTH EVERY DAY  ? triamcinolone ointment (KENALOG) 0.1 % Apply daily to affected area  ?  ? ?Allergies:   Codeine, Hydromorphone hcl, Ibuprofen, Morphine sulfate, and Metronidazole  ? ?Social History  ? ?Socioeconomic History  ? Marital status: Married  ?  Spouse name: Casimiro Needle  ? Number of children: 2  ? Years of education: 3  ?  Highest education level: Not on file  ?Occupational History  ? Occupation: retired  ?Tobacco Use  ? Smoking status: Former  ?  Packs/day: 2.00  ?  Years: 40.00  ?  Pack years: 80.00  ?  Types: Cigarettes  ?  Quit date: 04/05/2007  ?  Years since quitting: 14.4  ? Smokeless tobacco: Never  ?Vaping Use  ? Vaping Use: Not on file  ?Substance and Sexual Activity  ? Alcohol use: No  ? Drug use: No  ? Sexual activity: Never  ?  Partners: Male  ?Other Topics Concern  ? Not on file  ?Social History Narrative  ? Patient is right handed,reside in home with husband  ? ?Social Determinants of Health  ? ?Financial Resource Strain: Not on file  ?Food Insecurity: Not on file  ?Transportation Needs: Not on file  ?Physical Activity: Not on file  ?Stress: Not on file  ?Social Connections: Not on file  ?  ? ?Family History: ?The patient's family history includes AAA (abdominal aortic aneurysm) in her father and sister; Arthritis in her sister; COPD in her father and sister; Dementia in her sister; Heart disease in her father and mother; Other in her sister; Stroke (age of onset: 54) in her mother. There is no history of Multiple sclerosis. ? ?ROS:   ?Please see the history of present illness.    ?She is concerned about how her heart will hold with upcoming procedures.  She also complains of fatigue and dyspnea on exertion.  She understands that some deconditioning may be present since she is not able to walk very far or do very much because of severe back discomfort.  All other systems reviewed and are negative. ? ?EKGs/Labs/Other Studies Reviewed:   ? ?The following studies were reviewed today: ? ?CORONARY ANGIOGRAPHY 2021: ?Diagnostic ?Dominance: Right ?Intervention ? ? ?ABDOMINAL AORTIC DUPLEX 2022: ?Summary:  ?Abdominal Aorta: There is evidence of abnormal dilatation of the distal  ?Abdominal aorta. The largest aortic measurement is 4.6 cm. The largest  ?aortic diameter has increased compared to prior exam. Previous diameter   ?measurement was 4.4 cm obtained on  ?03/12/2020.  ?The right common iliac artery measures 1.7 x 1.8 cm in diameter.  ? ?EKG:  EKG normal sinus rhythm, right bundle branch block, nonspecific ST-T wave changes, and when compared 2022, no changes noted other than faster rate. ? ?Recent Labs: ?05/24/2021: ALT 22; BUN 11; Creatinine, Ser 0.78; Hemoglobin 13.9; Platelets 126.0; Potassium 4.5; Sodium 137; TSH 1.04  ?Recent Lipid Panel ?   ?Component Value Date/Time  ? CHOL 142 05/24/2021 1019  ? CHOL 130 08/26/2019 0731  ? TRIG 145.0 05/24/2021 1019  ? HDL 42.00 05/24/2021 1019  ? HDL 47 08/26/2019 0731  ? CHOLHDL 3 05/24/2021 1019  ? VLDL 29.0 05/24/2021 1019  ? LDLCALC 71 05/24/2021 1019  ? LDLCALC 65 08/26/2019 0731  ? ? ?  Physical Exam:   ? ?VS:  BP 98/64   Pulse 82   Ht 5\' 6"  (1.676 m)   Wt 235 lb (106.6 kg)   SpO2 98%   BMI 37.93 kg/m?    ? ?Wt Readings from Last 3 Encounters:  ?09/21/21 235 lb (106.6 kg)  ?05/25/21 250 lb 9.6 oz (113.7 kg)  ?05/24/21 249 lb 3.2 oz (113 kg)  ?  ? ?GEN: Morbid. No acute distress ?HEENT: Normal ?NECK: No JVD. ?LYMPHATICS: No lymphadenopathy ?CARDIAC: No murmur. RRR no gallop, or edema. ?VASCULAR:  Normal Pulses. No bruits. ?RESPIRATORY:  Clear to auscultation without rales, wheezing or rhonchi  ?ABDOMEN: Soft, non-tender, non-distended, No pulsatile mass, ?MUSCULOSKELETAL: No deformity  ?SKIN: Warm and dry ?NEUROLOGIC:  Alert and oriented x 3 ?PSYCHIATRIC:  Normal affect  ? ?ASSESSMENT:   ? ?1. Coronary artery disease of native artery of native heart with stable angina pectoris (HCC)   ?2. Essential hypertension   ?3. Abdominal aortic aneurysm (AAA) without rupture, unspecified part (HCC)   ?4. Hyperlipidemia LDL goal <100   ?5. Right bundle branch block   ?6. MORBID OBESITY   ? ?PLAN:   ? ?In order of problems listed above: ? ?Lexiscan myocardial perfusion imaging to rule out LAD and circumflex ischemia.  She had borderline disease several years ago. ?Blood pressure repeated is  125/72 mmHg.  Continue same management strategy. ?Being followed by [facility].  Abdominal aortic aneurysm diameter was 46 mm on last evaluation.  Upcoming appointment scheduled. ?LDL cholesterol 71 in December 2022. ?[place]

## 2021-09-19 DIAGNOSIS — M546 Pain in thoracic spine: Secondary | ICD-10-CM | POA: Diagnosis not present

## 2021-09-19 DIAGNOSIS — M5136 Other intervertebral disc degeneration, lumbar region: Secondary | ICD-10-CM | POA: Diagnosis not present

## 2021-09-19 DIAGNOSIS — Z79899 Other long term (current) drug therapy: Secondary | ICD-10-CM | POA: Diagnosis not present

## 2021-09-19 DIAGNOSIS — Z5181 Encounter for therapeutic drug level monitoring: Secondary | ICD-10-CM | POA: Diagnosis not present

## 2021-09-21 ENCOUNTER — Encounter: Payer: Self-pay | Admitting: *Deleted

## 2021-09-21 ENCOUNTER — Ambulatory Visit (INDEPENDENT_AMBULATORY_CARE_PROVIDER_SITE_OTHER): Payer: PPO | Admitting: Interventional Cardiology

## 2021-09-21 ENCOUNTER — Encounter: Payer: Self-pay | Admitting: Interventional Cardiology

## 2021-09-21 VITALS — BP 98/64 | HR 82 | Ht 66.0 in | Wt 235.0 lb

## 2021-09-21 DIAGNOSIS — E785 Hyperlipidemia, unspecified: Secondary | ICD-10-CM

## 2021-09-21 DIAGNOSIS — I10 Essential (primary) hypertension: Secondary | ICD-10-CM | POA: Diagnosis not present

## 2021-09-21 DIAGNOSIS — I4510 Unspecified right bundle-branch block: Secondary | ICD-10-CM | POA: Diagnosis not present

## 2021-09-21 DIAGNOSIS — I25118 Atherosclerotic heart disease of native coronary artery with other forms of angina pectoris: Secondary | ICD-10-CM | POA: Diagnosis not present

## 2021-09-21 DIAGNOSIS — R079 Chest pain, unspecified: Secondary | ICD-10-CM | POA: Diagnosis not present

## 2021-09-21 DIAGNOSIS — I714 Abdominal aortic aneurysm, without rupture, unspecified: Secondary | ICD-10-CM | POA: Diagnosis not present

## 2021-09-21 MED ORDER — ATORVASTATIN CALCIUM 80 MG PO TABS: 80.0000 mg | ORAL_TABLET | Freq: Every day | ORAL | 3 refills | Status: DC

## 2021-09-21 NOTE — Addendum Note (Signed)
Addended by: Loren Racer on: 09/21/2021 02:33 PM ? ? Modules accepted: Orders ? ?

## 2021-09-21 NOTE — Patient Instructions (Signed)
Medication Instructions:  ?Your physician recommends that you continue on your current medications as directed. Please refer to the Current Medication list given to you today. ? ?*If you need a refill on your cardiac medications before your next appointment, please call your pharmacy* ? ? ?Lab Work: ?None ?If you have labs (blood work) drawn today and your tests are completely normal, you will receive your results only by: ?MyChart Message (if you have MyChart) OR ?A paper copy in the mail ?If you have any lab test that is abnormal or we need to change your treatment, we will call you to review the results. ? ? ?Testing/Procedures: ?Your physician has requested that you have a lexiscan myoview. For further information please visit [email or web address]. Please follow instruction sheet, as given. ? ? ?Follow-Up: ?At [facility], you and your health needs are our priority.  As part of our continuing mission to provide you with exceptional heart care, we have created designated Provider Care Teams.  These Care Teams include your primary Cardiologist (physician) and Advanced Practice Providers (APPs -  Physician Assistants and Nurse Practitioners) who all work together to provide you with the care you need, when you need it. ? ?We recommend signing up for the patient portal called "MyChart".  Sign up information is provided on this After Visit Summary.  MyChart is used to connect with patients for Virtual Visits (Telemedicine).  Patients are able to view lab/test results, encounter notes, upcoming appointments, etc.  Non-urgent messages can be sent to your provider as well.   ?To learn more about what you can do with MyChart, go to [email or web address].   ? ?Your next appointment:   ?1 year(s) ? ?The format for your next appointment:   ?In Person ? ?Provider:   ?Sinclair Grooms, MD  ? ? ?Other Instructions ? ? ?Important Information About Sugar ? ? ? ? ?  ?

## 2021-09-27 ENCOUNTER — Telehealth (HOSPITAL_COMMUNITY): Payer: Self-pay | Admitting: *Deleted

## 2021-09-27 NOTE — Progress Notes (Signed)
[facility] [place] ?HISTORY AND PHYSICAL  ? ?History of Present Illness:  Patient is a 71 y.o. year old female who presents for evaluation of abdominal aortic aneurysm. She is here today for exam and follow up AAA duplex surveillance.  The largest diameter was 4.6 cm on her last visit 03/23/21. ? She denies abdominal or lumbar pain.  She has no symptoms of claudication, rest pain or tissue loss ischemic changes.  ? She has history of chronic back pain and CAD with minimal coronary artery stenosis on a past cardiac cath.  She has plans in the near future for nuclear stress test with her cardiologist.   ?She is medically managed on ASA and Lipitor. ? ?Past Medical History:  ?Diagnosis Date  ? AAA (abdominal aortic aneurysm) ([place])   ? Allergic rhinitis   ? Chronic hoarseness   ? Colon polyps   ? COPD (chronic obstructive pulmonary disease) ([place])   ? Dr. Chase Caller  ? Dyslipidemia   ? Hypertension   ? Hypothyroidism   ? MS (multiple sclerosis) ([place])   ? Obesity   ? Stomach ulcer from aspirin/ibuprofen-like drugs (NSAID's)   ? ? ?Past Surgical History:  ?Procedure Laterality Date  ? ABDOMINAL HYSTERECTOMY    ? BARIATRIC SURGERY  09/07/08  ? lap band  ? CARPAL TUNNEL RELEASE    ? CATARACT EXTRACTION Right 11/08/2017  ? CATARACT EXTRACTION Left 11/22/2017  ? CATARACT EXTRACTION, BILATERAL    ?  Toric lenses for astigmatism  ? CHOLECYSTECTOMY    ? CHOLECYSTECTOMY    ? HIATAL HERNIA REPAIR  09/07/08  ? LEFT HEART CATH AND CORONARY ANGIOGRAPHY N/A 07/09/2019  ? Procedure: LEFT HEART CATH AND CORONARY ANGIOGRAPHY;  Surgeon: Belva Crome, MD;  Location: [facility];  Service: Cardiovascular;  Laterality: N/A;  ? ? ? ?Social History ?Social History  ? ?Tobacco Use  ? Smoking status: Former  ?  Packs/day: 2.00  ?  Years: 40.00  ?  Pack years: 80.00  ?  Types: Cigarettes  ?  Quit date: 04/05/2007  ?  Years since quitting: 14.4  ?  Passive exposure: Current (Husband)  ? Smokeless tobacco: Never  ?Substance  Use Topics  ? Alcohol use: No  ? Drug use: No  ? ? ?Family History ?Family History  ?Problem Relation Age of Onset  ? Stroke Mother 21  ? Heart disease Mother   ?     congenital heart defect?  ? COPD Father   ? Heart disease Father   ? AAA (abdominal aortic aneurysm) Father   ? COPD Sister   ? Arthritis Sister   ? AAA (abdominal aortic aneurysm) Sister   ? Dementia Sister   ? Other Sister   ?     brain injury  ? Multiple sclerosis Neg Hx   ? ? ?Allergies ? ?Allergies  ?Allergen Reactions  ? Codeine Itching and Nausea And Vomiting  ? Hydromorphone Hcl Nausea And Vomiting  ? Ibuprofen Other (See Comments)  ?  stomach ulcers  ? Morphine Sulfate   ?  "makes my blood pressure bottom out"  ? Metronidazole Hives, Nausea And Vomiting and Rash  ? ? ? ?Current Outpatient Medications  ?Medication Sig Dispense Refill  ? acetaminophen (TYLENOL) 500 MG tablet Take 1,000 mg by mouth every 6 (six) hours as needed for moderate pain or headache.    ? amoxicillin-clavulanate (AUGMENTIN) 875-125 MG tablet Take 1 tablet by mouth every 12 (twelve) hours. 14 tablet 0  ?  aspirin EC 81 MG tablet Take 1 tablet (81 mg total) by mouth daily. 90 tablet 3  ? atorvastatin (LIPITOR) 80 MG tablet Take 1 tablet (80 mg total) by mouth daily. 90 tablet 3  ? calcium carbonate (TUMS - DOSED IN MG ELEMENTAL CALCIUM) 500 MG chewable tablet Chew 2 tablets by mouth daily as needed for indigestion or heartburn.    ? FLUoxetine (PROZAC) 40 MG capsule Take 1 capsule (40 mg total) by mouth daily. 90 capsule 3  ? gabapentin (NEURONTIN) 300 MG capsule Take 600 mg by mouth every evening.     ? HYDROcodone-acetaminophen (NORCO/VICODIN) 5-325 MG per tablet Take 0.5 tablets by mouth 2 (two) times daily as needed for moderate pain.     ? Interferon Beta-1b (BETASERON/EXTAVIA) 0.3 MG KIT injection Inject 0.25 mg into the skin every other day.    ? levocetirizine (XYZAL) 5 MG tablet Take 1 tablet (5 mg total) by mouth every evening. 30 tablet 5  ? levothyroxine  (SYNTHROID) 112 MCG tablet TAKE 1 TABLET BY MOUTH EVERY DAY BEFORE BREAKFAST 90 tablet 1  ? mometasone (NASONEX) 50 MCG/ACT nasal spray Place 2 sprays into the nose as needed.    ? Polyethyl Glycol-Propyl Glycol (SYSTANE ULTRA OP) Place 1 drop into the right eye daily as needed (dry eye).     ? silver sulfADIAZINE (SILVADENE) 1 % cream Apply daily to irritated areas under abdomen    ? telmisartan-hydrochlorothiazide (MICARDIS HCT) 40-12.5 MG tablet TAKE 1/2 TABLET BY MOUTH EVERY DAY 45 tablet 1  ? triamcinolone ointment (KENALOG) 0.1 % Apply daily to affected area    ? ?No current facility-administered medications for this visit.  ? ? ?ROS:  ? ?General:  No weight loss, Fever, chills ? ?HEENT: No recent headaches, no nasal bleeding, no visual changes, no sore throat ? ?Neurologic: No dizziness, blackouts, seizures. No recent symptoms of stroke or mini- stroke. No recent episodes of slurred speech, or temporary blindness. ? ?Cardiac: No recent episodes of chest pain/pressure, no shortness of breath at rest.  No shortness of breath with exertion.  Denies history of atrial fibrillation or irregular heartbeat ? ?Vascular: No history of rest pain in feet.  No history of claudication.  No history of non-healing ulcer, No history of DVT  ? ?Pulmonary: No home oxygen, no productive cough, no hemoptysis,  No asthma or wheezing ? ?Musculoskeletal:  [ ]  Arthritis, [ x] Low back pain,  [ x] Joint pain ? ?Hematologic:No history of hypercoagulable state.  No history of easy bleeding.  No history of anemia ? ?Gastrointestinal: No hematochezia or melena,  No gastroesophageal reflux, no trouble swallowing ? ?Urinary: [ ]  chronic Kidney disease, [ ]  on HD - [ ]  MWF or [ ]  TTHS, [ ]  Burning with urination, [ ]  Frequent urination, [ ]  Difficulty urinating;  ? ?Skin: No rashes ? ?Psychological: No history of anxiety,  No history of depression ? ? ?Physical Examination ? ?Vitals:  ? 09/28/21 0929  ?BP: (!) 153/89  ?Pulse: 69  ?Resp: 20   ?Temp: (!) 97.4 ?F (36.3 ?C)  ?TempSrc: Temporal  ?SpO2: 98%  ?Weight: 233 lb 8 oz (105.9 kg)  ?Height: 5\' 6"  (1.676 m)  ? ? ?Body mass index is 37.69 kg/m?. ? ?General:  Alert and oriented, no acute distress ?HEENT: Normal ?Neck: No bruit or JVD ?Pulmonary: Clear to auscultation bilaterally ?Cardiac: Regular Rate and Rhythm without murmur ?Gastrointestinal: Soft, non-tender, non-distended, no mass, no scars ?Skin: No rash ? ?Musculoskeletal: No deformity or  edema  ?Neurologic: Upper and lower extremity motor 5/5 and symmetric ? ?DATA:  ? ? ?   ?Abdominal Aorta Findings:  ?+-----------+-------+----------+----------+--------+--------+--------+  ?Location   AP (cm)Trans (cm)PSV (cm/s)WaveformThrombusComments  ?+-----------+-------+----------+----------+--------+--------+--------+  ?Proximal   2.70   2.59      60                                  ?+-----------+-------+----------+----------+--------+--------+--------+  ?Mid        4.30   4.16      32                                  ?+-----------+-------+----------+----------+--------+--------+--------+  ?Distal     4.52   4.58      76                                  ?+-----------+-------+----------+----------+--------+--------+--------+  ?RT CIA Prox1.6    1.9       148                                 ?+-----------+-------+----------+----------+--------+--------+--------+  ?LT CIA Prox1.6    1.8       112                                 ?+-----------+-------+----------+----------+--------+--------+--------+  ? ?Summary:  ?Abdominal Aorta: There is evidence of abnormal dilatation of the mid and  ?distal Abdominal aorta. The largest aortic measurement is 4.6 cm. The  ?largest aortic diameter remains essentially unchanged compared to prior  ?exam. Previous diameter measurement was  ? 4.6 cm obtained on 03/23/2021.  ?   ?ASSESSMENT/PLAN: ?Asymptomatic AAA  ?Duplex demonstrates largest diameter of 4.58 which is unchanged from  from the duplex 6 months prior.  She remain asymptomatic of sever abdominal/lumbar pain from her baseline.   ? ? She denies claudication, rest pain or non healing wounds.  She will f/u in 1 year for repeat surveillance of the AAA.  If she has questions or

## 2021-09-27 NOTE — Telephone Encounter (Signed)
Patient given detailed instructions per Myocardial Perfusion Study Information Sheet for the test on 10/03/2021 at 8:15. Patient notified to arrive 15 minutes early and that it is imperative to arrive on time for appointment to keep from having the test rescheduled.  If you need to cancel or reschedule your appointment, please call the office within 24 hours of your appointment. . Patient verbalized understanding.Glade Lloyd S ? ? ?

## 2021-09-27 NOTE — H&P (View-Only) (Signed)
[facility] ?HISTORY AND PHYSICAL  ? ?History of Present Illness:  Patient is a 71 y.o. year old female who presents for evaluation of abdominal aortic aneurysm. She is here today for exam and follow up AAA duplex surveillance.  The largest diameter was 4.6 cm on her last visit 03/23/21. ? She denies abdominal or lumbar pain.  She has no symptoms of claudication, rest pain or tissue loss ischemic changes.  ? She has history of chronic back pain and CAD with minimal coronary artery stenosis on a past cardiac cath.  She has plans in the near future for nuclear stress test with her cardiologist.   ?She is medically managed on ASA and Lipitor. ? ?Past Medical History:  ?Diagnosis Date  ? AAA (abdominal aortic aneurysm) ([place])   ? Allergic rhinitis   ? Chronic hoarseness   ? Colon polyps   ? COPD (chronic obstructive pulmonary disease) ([place])   ? Dr. Chase Caller  ? Dyslipidemia   ? Hypertension   ? Hypothyroidism   ? MS (multiple sclerosis) ([place])   ? Obesity   ? Stomach ulcer from aspirin/ibuprofen-like drugs (NSAID's)   ? ? ?Past Surgical History:  ?Procedure Laterality Date  ? ABDOMINAL HYSTERECTOMY    ? BARIATRIC SURGERY  09/07/08  ? lap band  ? CARPAL TUNNEL RELEASE    ? CATARACT EXTRACTION Right 11/08/2017  ? CATARACT EXTRACTION Left 11/22/2017  ? CATARACT EXTRACTION, BILATERAL    ?  Toric lenses for astigmatism  ? CHOLECYSTECTOMY    ? CHOLECYSTECTOMY    ? HIATAL HERNIA REPAIR  09/07/08  ? LEFT HEART CATH AND CORONARY ANGIOGRAPHY N/A 07/09/2019  ? Procedure: LEFT HEART CATH AND CORONARY ANGIOGRAPHY;  Surgeon: Belva Crome, MD;  Location: [facility];  Service: Cardiovascular;  Laterality: N/A;  ? ? ? ?Social History ?Social History  ? ?Tobacco Use  ? Smoking status: Former  ?  Packs/day: 2.00  ?  Years: 40.00  ?  Pack years: 80.00  ?  Types: Cigarettes  ?  Quit date: 04/05/2007  ?  Years since quitting: 14.4  ?  Passive exposure: Current (Husband)  ? Smokeless tobacco: Never  ?Substance  Use Topics  ? Alcohol use: No  ? Drug use: No  ? ? ?Family History ?Family History  ?Problem Relation Age of Onset  ? Stroke Mother 78  ? Heart disease Mother   ?     congenital heart defect?  ? COPD Father   ? Heart disease Father   ? AAA (abdominal aortic aneurysm) Father   ? COPD Sister   ? Arthritis Sister   ? AAA (abdominal aortic aneurysm) Sister   ? Dementia Sister   ? Other Sister   ?     brain injury  ? Multiple sclerosis Neg Hx   ? ? ?Allergies ? ?Allergies  ?Allergen Reactions  ? Codeine Itching and Nausea And Vomiting  ? Hydromorphone Hcl Nausea And Vomiting  ? Ibuprofen Other (See Comments)  ?  stomach ulcers  ? Morphine Sulfate   ?  "makes my blood pressure bottom out"  ? Metronidazole Hives, Nausea And Vomiting and Rash  ? ? ? ?Current Outpatient Medications  ?Medication Sig Dispense Refill  ? acetaminophen (TYLENOL) 500 MG tablet Take 1,000 mg by mouth every 6 (six) hours as needed for moderate pain or headache.    ? amoxicillin-clavulanate (AUGMENTIN) 875-125 MG tablet Take 1 tablet by mouth every 12 (twelve) hours. 14 tablet 0  ?  aspirin EC 81 MG tablet Take 1 tablet (81 mg total) by mouth daily. 90 tablet 3  ? atorvastatin (LIPITOR) 80 MG tablet Take 1 tablet (80 mg total) by mouth daily. 90 tablet 3  ? calcium carbonate (TUMS - DOSED IN MG ELEMENTAL CALCIUM) 500 MG chewable tablet Chew 2 tablets by mouth daily as needed for indigestion or heartburn.    ? FLUoxetine (PROZAC) 40 MG capsule Take 1 capsule (40 mg total) by mouth daily. 90 capsule 3  ? gabapentin (NEURONTIN) 300 MG capsule Take 600 mg by mouth every evening.     ? HYDROcodone-acetaminophen (NORCO/VICODIN) 5-325 MG per tablet Take 0.5 tablets by mouth 2 (two) times daily as needed for moderate pain.     ? Interferon Beta-1b (BETASERON/EXTAVIA) 0.3 MG KIT injection Inject 0.25 mg into the skin every other day.    ? levocetirizine (XYZAL) 5 MG tablet Take 1 tablet (5 mg total) by mouth every evening. 30 tablet 5  ? levothyroxine  (SYNTHROID) 112 MCG tablet TAKE 1 TABLET BY MOUTH EVERY DAY BEFORE BREAKFAST 90 tablet 1  ? mometasone (NASONEX) 50 MCG/ACT nasal spray Place 2 sprays into the nose as needed.    ? Polyethyl Glycol-Propyl Glycol (SYSTANE ULTRA OP) Place 1 drop into the right eye daily as needed (dry eye).     ? silver sulfADIAZINE (SILVADENE) 1 % cream Apply daily to irritated areas under abdomen    ? telmisartan-hydrochlorothiazide (MICARDIS HCT) 40-12.5 MG tablet TAKE 1/2 TABLET BY MOUTH EVERY DAY 45 tablet 1  ? triamcinolone ointment (KENALOG) 0.1 % Apply daily to affected area    ? ?No current facility-administered medications for this visit.  ? ? ?ROS:  ? ?General:  No weight loss, Fever, chills ? ?HEENT: No recent headaches, no nasal bleeding, no visual changes, no sore throat ? ?Neurologic: No dizziness, blackouts, seizures. No recent symptoms of stroke or mini- stroke. No recent episodes of slurred speech, or temporary blindness. ? ?Cardiac: No recent episodes of chest pain/pressure, no shortness of breath at rest.  No shortness of breath with exertion.  Denies history of atrial fibrillation or irregular heartbeat ? ?Vascular: No history of rest pain in feet.  No history of claudication.  No history of non-healing ulcer, No history of DVT  ? ?Pulmonary: No home oxygen, no productive cough, no hemoptysis,  No asthma or wheezing ? ?Musculoskeletal:  [ ]  Arthritis, [ x] Low back pain,  [ x] Joint pain ? ?Hematologic:No history of hypercoagulable state.  No history of easy bleeding.  No history of anemia ? ?Gastrointestinal: No hematochezia or melena,  No gastroesophageal reflux, no trouble swallowing ? ?Urinary: [ ]  chronic Kidney disease, [ ]  on HD - [ ]  MWF or [ ]  TTHS, [ ]  Burning with urination, [ ]  Frequent urination, [ ]  Difficulty urinating;  ? ?Skin: No rashes ? ?Psychological: No history of anxiety,  No history of depression ? ? ?Physical Examination ? ?Vitals:  ? 09/28/21 0929  ?BP: (!) 153/89  ?Pulse: 69  ?Resp: 20   ?Temp: (!) 97.4 ?F (36.3 ?C)  ?TempSrc: Temporal  ?SpO2: 98%  ?Weight: 233 lb 8 oz (105.9 kg)  ?Height: 5\' 6"  (1.676 m)  ? ? ?Body mass index is 37.69 kg/m?. ? ?General:  Alert and oriented, no acute distress ?HEENT: Normal ?Neck: No bruit or JVD ?Pulmonary: Clear to auscultation bilaterally ?Cardiac: Regular Rate and Rhythm without murmur ?Gastrointestinal: Soft, non-tender, non-distended, no mass, no scars ?Skin: No rash ? ?Musculoskeletal: No deformity or  edema  ?Neurologic: Upper and lower extremity motor 5/5 and symmetric ? ?DATA:  ? ? ?   ?Abdominal Aorta Findings:  ?+-----------+-------+----------+----------+--------+--------+--------+  ?Location   AP (cm)Trans (cm)PSV (cm/s)WaveformThrombusComments  ?+-----------+-------+----------+----------+--------+--------+--------+  ?Proximal   2.70   2.59      60                                  ?+-----------+-------+----------+----------+--------+--------+--------+  ?Mid        4.30   4.16      32                                  ?+-----------+-------+----------+----------+--------+--------+--------+  ?Distal     4.52   4.58      76                                  ?+-----------+-------+----------+----------+--------+--------+--------+  ?RT CIA Prox1.6    1.9       148                                 ?+-----------+-------+----------+----------+--------+--------+--------+  ?LT CIA Prox1.6    1.8       112                                 ?+-----------+-------+----------+----------+--------+--------+--------+  ? ?Summary:  ?Abdominal Aorta: There is evidence of abnormal dilatation of the mid and  ?distal Abdominal aorta. The largest aortic measurement is 4.6 cm. The  ?largest aortic diameter remains essentially unchanged compared to prior  ?exam. Previous diameter measurement was  ? 4.6 cm obtained on 03/23/2021.  ?   ?ASSESSMENT/PLAN: ?Asymptomatic AAA  ?Duplex demonstrates largest diameter of 4.58 which is unchanged from  from the duplex 6 months prior.  She remain asymptomatic of sever abdominal/lumbar pain from her baseline.   ? ? She denies claudication, rest pain or non healing wounds.  She will f/u in 1 year for repeat surveillance of the AAA.  If she has questions or

## 2021-09-28 ENCOUNTER — Encounter: Payer: Self-pay | Admitting: Physician Assistant

## 2021-09-28 ENCOUNTER — Ambulatory Visit (INDEPENDENT_AMBULATORY_CARE_PROVIDER_SITE_OTHER): Payer: PPO | Admitting: Physician Assistant

## 2021-09-28 ENCOUNTER — Encounter (INDEPENDENT_AMBULATORY_CARE_PROVIDER_SITE_OTHER): Payer: Self-pay

## 2021-09-28 ENCOUNTER — Ambulatory Visit (HOSPITAL_COMMUNITY)
Admission: RE | Admit: 2021-09-28 | Discharge: 2021-09-28 | Disposition: A | Discharge status: Home / Self Care | Payer: PPO | Source: Ambulatory Visit | Attending: Vascular Surgery | Admitting: Vascular Surgery

## 2021-09-28 VITALS — BP 153/89 | HR 69 | Temp 97.4°F | Resp 20 | Ht 66.0 in | Wt 233.5 lb

## 2021-09-28 DIAGNOSIS — T819XXA Unspecified complication of procedure, initial encounter: Secondary | ICD-10-CM | POA: Insufficient documentation

## 2021-09-28 DIAGNOSIS — I714 Abdominal aortic aneurysm, without rupture, unspecified: Secondary | ICD-10-CM

## 2021-10-03 ENCOUNTER — Ambulatory Visit (HOSPITAL_COMMUNITY): Payer: PPO | Attending: Cardiology

## 2021-10-03 ENCOUNTER — Encounter (INDEPENDENT_AMBULATORY_CARE_PROVIDER_SITE_OTHER): Payer: Self-pay

## 2021-10-03 DIAGNOSIS — K922 Gastrointestinal hemorrhage, unspecified: Secondary | ICD-10-CM

## 2021-10-03 DIAGNOSIS — R079 Chest pain, unspecified: Secondary | ICD-10-CM | POA: Diagnosis not present

## 2021-10-03 DIAGNOSIS — I25118 Atherosclerotic heart disease of native coronary artery with other forms of angina pectoris: Secondary | ICD-10-CM | POA: Insufficient documentation

## 2021-10-03 HISTORY — DX: Gastrointestinal hemorrhage, unspecified: K92.2

## 2021-10-03 MED ORDER — TECHNETIUM TC 99M TETROFOSMIN IV KIT
30.6000 | PACK | Freq: Once | INTRAVENOUS | Status: AC | PRN
  Administered 2021-10-03: 30.6 via INTRAVENOUS
  Filled 2021-10-03: qty 31

## 2021-10-03 MED ORDER — REGADENOSON 0.4 MG/5ML IV SOLN
0.4000 mg | Freq: Once | INTRAVENOUS | Status: AC
  Administered 2021-10-03: 0.4 mg via INTRAVENOUS

## 2021-10-04 ENCOUNTER — Ambulatory Visit (HOSPITAL_COMMUNITY): Payer: PPO | Attending: Internal Medicine

## 2021-10-04 LAB — MYOCARDIAL PERFUSION IMAGING
LV dias vol: 43 mL (ref 46–106)
LV sys vol: 9 mL
Nuc Stress EF: 78 %
Peak HR: 71 {beats}/min
Rest HR: 63 {beats}/min
Rest Nuclear Isotope Dose: 29.2 mCi
SDS: 2
SRS: 0
SSS: 2
ST Depression (mm): 0 mm
Stress Nuclear Isotope Dose: 30.6 mCi
TID: 1.11

## 2021-10-04 MED ORDER — TECHNETIUM TC 99M TETROFOSMIN IV KIT
29.2000 | PACK | Freq: Once | INTRAVENOUS | Status: AC | PRN
  Administered 2021-10-04: 29.2 via INTRAVENOUS
  Filled 2021-10-04: qty 30

## 2021-10-05 ENCOUNTER — Other Ambulatory Visit: Payer: Self-pay | Admitting: *Deleted

## 2021-10-05 DIAGNOSIS — I714 Abdominal aortic aneurysm, without rupture, unspecified: Secondary | ICD-10-CM

## 2021-10-06 ENCOUNTER — Telehealth: Payer: Self-pay | Admitting: Interventional Cardiology

## 2021-10-06 NOTE — Telephone Encounter (Signed)
Patient would like to know if she can get a prescription for nitroglycerin.  ?

## 2021-10-06 NOTE — Telephone Encounter (Signed)
The patient has been notified of the result and verbalized understanding.  All questions (if any) were answered. ?Antonieta Iba, RN 10/06/2021 11:51 AM  ? ?

## 2021-10-06 NOTE — Telephone Encounter (Signed)
-----   Message from Belva Crome, MD sent at 10/05/2021  5:24 PM EDT ----- ?Let the patient know the stress test does not reveal evidence that there is any significant blockage in the major arteries.  Overall function is normal at 78% ejection fraction. ?A copy will be sent to Stacie Glaze, DO ?

## 2021-10-06 NOTE — Telephone Encounter (Signed)
?  Pt is calling to get stress result ?

## 2021-10-07 MED ORDER — NITROGLYCERIN 0.4 MG SL SUBL: 0.4000 mg | SUBLINGUAL_TABLET | SUBLINGUAL | 3 refills | Status: DC | PRN

## 2021-10-07 NOTE — Telephone Encounter (Signed)
Spoke with the patient and advised that Rx has been sent in for nitroglycerin. She is aware on how to use it. She will let us know if she is having to use it frequently.  ?

## 2021-10-07 NOTE — Addendum Note (Signed)
Addended by: Antonieta Iba on: 10/07/2021 08:16 AM ? ? Modules accepted: Orders ? ?

## 2021-10-17 ENCOUNTER — Encounter (HOSPITAL_COMMUNITY): Payer: Self-pay | Admitting: Emergency Medicine

## 2021-10-17 ENCOUNTER — Other Ambulatory Visit: Payer: Self-pay

## 2021-10-17 ENCOUNTER — Emergency Department (HOSPITAL_COMMUNITY): Payer: PPO

## 2021-10-17 ENCOUNTER — Inpatient Hospital Stay (HOSPITAL_COMMUNITY): Payer: PPO | Admitting: Anesthesiology

## 2021-10-17 ENCOUNTER — Inpatient Hospital Stay (HOSPITAL_COMMUNITY)
Admission: EM | Admit: 2021-10-17 | Discharge: 2021-10-21 | DRG: 378 | Disposition: A | Discharge status: Home Health | Payer: PPO | Attending: Internal Medicine | Admitting: Internal Medicine

## 2021-10-17 ENCOUNTER — Encounter (HOSPITAL_COMMUNITY)
Admission: EM | Disposition: A | Discharge status: Home Health | Payer: Self-pay | Source: Home / Self Care | Attending: Internal Medicine

## 2021-10-17 DIAGNOSIS — S199XXA Unspecified injury of neck, initial encounter: Secondary | ICD-10-CM | POA: Diagnosis not present

## 2021-10-17 DIAGNOSIS — Z9884 Bariatric surgery status: Secondary | ICD-10-CM | POA: Diagnosis not present

## 2021-10-17 DIAGNOSIS — E6609 Other obesity due to excess calories: Secondary | ICD-10-CM | POA: Diagnosis not present

## 2021-10-17 DIAGNOSIS — R55 Syncope and collapse: Principal | ICD-10-CM

## 2021-10-17 DIAGNOSIS — J449 Chronic obstructive pulmonary disease, unspecified: Secondary | ICD-10-CM | POA: Diagnosis present

## 2021-10-17 DIAGNOSIS — E66812 Obesity, class 2: Secondary | ICD-10-CM

## 2021-10-17 DIAGNOSIS — K920 Hematemesis: Secondary | ICD-10-CM | POA: Diagnosis not present

## 2021-10-17 DIAGNOSIS — D62 Acute posthemorrhagic anemia: Secondary | ICD-10-CM | POA: Diagnosis not present

## 2021-10-17 DIAGNOSIS — Z888 Allergy status to other drugs, medicaments and biological substances status: Secondary | ICD-10-CM

## 2021-10-17 DIAGNOSIS — Z8719 Personal history of other diseases of the digestive system: Secondary | ICD-10-CM | POA: Diagnosis not present

## 2021-10-17 DIAGNOSIS — Z79890 Hormone replacement therapy (postmenopausal): Secondary | ICD-10-CM

## 2021-10-17 DIAGNOSIS — D638 Anemia in other chronic diseases classified elsewhere: Secondary | ICD-10-CM | POA: Diagnosis not present

## 2021-10-17 DIAGNOSIS — Z66 Do not resuscitate: Secondary | ICD-10-CM | POA: Diagnosis present

## 2021-10-17 DIAGNOSIS — Z823 Family history of stroke: Secondary | ICD-10-CM

## 2021-10-17 DIAGNOSIS — F39 Unspecified mood [affective] disorder: Secondary | ICD-10-CM | POA: Diagnosis not present

## 2021-10-17 DIAGNOSIS — Z6837 Body mass index (BMI) 37.0-37.9, adult: Secondary | ICD-10-CM

## 2021-10-17 DIAGNOSIS — K254 Chronic or unspecified gastric ulcer with hemorrhage: Secondary | ICD-10-CM

## 2021-10-17 DIAGNOSIS — R918 Other nonspecific abnormal finding of lung field: Secondary | ICD-10-CM | POA: Diagnosis not present

## 2021-10-17 DIAGNOSIS — Z825 Family history of asthma and other chronic lower respiratory diseases: Secondary | ICD-10-CM

## 2021-10-17 DIAGNOSIS — I10 Essential (primary) hypertension: Secondary | ICD-10-CM | POA: Diagnosis not present

## 2021-10-17 DIAGNOSIS — I2510 Atherosclerotic heart disease of native coronary artery without angina pectoris: Secondary | ICD-10-CM | POA: Diagnosis not present

## 2021-10-17 DIAGNOSIS — F32A Depression, unspecified: Secondary | ICD-10-CM | POA: Diagnosis not present

## 2021-10-17 DIAGNOSIS — Z79899 Other long term (current) drug therapy: Secondary | ICD-10-CM | POA: Diagnosis not present

## 2021-10-17 DIAGNOSIS — S0990XA Unspecified injury of head, initial encounter: Secondary | ICD-10-CM | POA: Diagnosis not present

## 2021-10-17 DIAGNOSIS — Z885 Allergy status to narcotic agent status: Secondary | ICD-10-CM

## 2021-10-17 DIAGNOSIS — L900 Lichen sclerosus et atrophicus: Secondary | ICD-10-CM | POA: Diagnosis present

## 2021-10-17 DIAGNOSIS — Z886 Allergy status to analgesic agent status: Secondary | ICD-10-CM | POA: Diagnosis not present

## 2021-10-17 DIAGNOSIS — K922 Gastrointestinal hemorrhage, unspecified: Secondary | ICD-10-CM | POA: Diagnosis present

## 2021-10-17 DIAGNOSIS — W19XXXA Unspecified fall, initial encounter: Secondary | ICD-10-CM | POA: Diagnosis not present

## 2021-10-17 DIAGNOSIS — R079 Chest pain, unspecified: Secondary | ICD-10-CM | POA: Diagnosis not present

## 2021-10-17 DIAGNOSIS — Z8249 Family history of ischemic heart disease and other diseases of the circulatory system: Secondary | ICD-10-CM

## 2021-10-17 DIAGNOSIS — K2971 Gastritis, unspecified, with bleeding: Secondary | ICD-10-CM | POA: Diagnosis not present

## 2021-10-17 DIAGNOSIS — E039 Hypothyroidism, unspecified: Secondary | ICD-10-CM | POA: Diagnosis not present

## 2021-10-17 DIAGNOSIS — G35 Multiple sclerosis: Secondary | ICD-10-CM | POA: Diagnosis present

## 2021-10-17 DIAGNOSIS — K921 Melena: Secondary | ICD-10-CM | POA: Diagnosis not present

## 2021-10-17 DIAGNOSIS — K2970 Gastritis, unspecified, without bleeding: Secondary | ICD-10-CM | POA: Diagnosis present

## 2021-10-17 DIAGNOSIS — Z87891 Personal history of nicotine dependence: Secondary | ICD-10-CM

## 2021-10-17 DIAGNOSIS — I951 Orthostatic hypotension: Secondary | ICD-10-CM | POA: Diagnosis not present

## 2021-10-17 DIAGNOSIS — Z8261 Family history of arthritis: Secondary | ICD-10-CM | POA: Diagnosis not present

## 2021-10-17 DIAGNOSIS — Z7982 Long term (current) use of aspirin: Secondary | ICD-10-CM | POA: Diagnosis not present

## 2021-10-17 DIAGNOSIS — K259 Gastric ulcer, unspecified as acute or chronic, without hemorrhage or perforation: Secondary | ICD-10-CM | POA: Diagnosis not present

## 2021-10-17 DIAGNOSIS — E785 Hyperlipidemia, unspecified: Secondary | ICD-10-CM | POA: Diagnosis not present

## 2021-10-17 DIAGNOSIS — J929 Pleural plaque without asbestos: Secondary | ICD-10-CM | POA: Diagnosis not present

## 2021-10-17 DIAGNOSIS — I714 Abdominal aortic aneurysm, without rupture, unspecified: Secondary | ICD-10-CM | POA: Diagnosis not present

## 2021-10-17 DIAGNOSIS — R0789 Other chest pain: Secondary | ICD-10-CM | POA: Diagnosis not present

## 2021-10-17 DIAGNOSIS — I700 Atherosclerosis of aorta: Secondary | ICD-10-CM | POA: Diagnosis not present

## 2021-10-17 HISTORY — PX: ESOPHAGOGASTRODUODENOSCOPY (EGD) WITH PROPOFOL: SHX5813

## 2021-10-17 LAB — I-STAT CHEM 8, ED
BUN: 36 mg/dL — ABNORMAL HIGH (ref 8–23)
Calcium, Ion: 1.15 mmol/L (ref 1.15–1.40)
Chloride: 103 mmol/L (ref 98–111)
Creatinine, Ser: 0.9 mg/dL (ref 0.44–1.00)
Glucose, Bld: 130 mg/dL — ABNORMAL HIGH (ref 70–99)
HCT: 33 % — ABNORMAL LOW (ref 36.0–46.0)
Hemoglobin: 11.2 g/dL — ABNORMAL LOW (ref 12.0–15.0)
Potassium: 3.9 mmol/L (ref 3.5–5.1)
Sodium: 135 mmol/L (ref 135–145)
TCO2: 20 mmol/L — ABNORMAL LOW (ref 22–32)

## 2021-10-17 LAB — TYPE AND SCREEN
ABO/RH(D): B POS
Antibody Screen: NEGATIVE

## 2021-10-17 LAB — CBC WITH DIFFERENTIAL/PLATELET
Abs Immature Granulocytes: 0.21 10*3/uL — ABNORMAL HIGH (ref 0.00–0.07)
Abs Immature Granulocytes: 0.03 10*3/uL (ref 0.00–0.07)
Basophils Absolute: 0 10*3/uL (ref 0.0–0.1)
Basophils Absolute: 0 10*3/uL (ref 0.0–0.1)
Basophils Relative: 0 %
Basophils Relative: 0 %
Eosinophils Absolute: 0 10*3/uL (ref 0.0–0.5)
Eosinophils Absolute: 0 10*3/uL (ref 0.0–0.5)
Eosinophils Relative: 0 %
Eosinophils Relative: 1 %
HCT: 32.7 % — ABNORMAL LOW (ref 36.0–46.0)
HCT: 26.9 % — ABNORMAL LOW (ref 36.0–46.0)
Hemoglobin: 11.1 g/dL — ABNORMAL LOW (ref 12.0–15.0)
Hemoglobin: 9 g/dL — ABNORMAL LOW (ref 12.0–15.0)
Immature Granulocytes: 2 %
Immature Granulocytes: 0 %
Lymphocytes Relative: 15 %
Lymphocytes Relative: 25 %
Lymphs Abs: 1.8 10*3/uL (ref 0.7–4.0)
Lymphs Abs: 2.1 10*3/uL (ref 0.7–4.0)
MCH: 30.8 pg (ref 26.0–34.0)
MCH: 30.5 pg (ref 26.0–34.0)
MCHC: 33.9 g/dL (ref 30.0–36.0)
MCHC: 33.5 g/dL (ref 30.0–36.0)
MCV: 90.8 fL (ref 80.0–100.0)
MCV: 91.2 fL (ref 80.0–100.0)
Monocytes Absolute: 0.7 10*3/uL (ref 0.1–1.0)
Monocytes Absolute: 0.6 10*3/uL (ref 0.1–1.0)
Monocytes Relative: 6 %
Monocytes Relative: 7 %
Neutro Abs: 9.4 10*3/uL — ABNORMAL HIGH (ref 1.7–7.7)
Neutro Abs: 5.8 10*3/uL (ref 1.7–7.7)
Neutrophils Relative %: 77 %
Neutrophils Relative %: 67 %
Platelets: 227 10*3/uL (ref 150–400)
Platelets: 156 10*3/uL (ref 150–400)
RBC: 3.6 MIL/uL — ABNORMAL LOW (ref 3.87–5.11)
RBC: 2.95 MIL/uL — ABNORMAL LOW (ref 3.87–5.11)
RDW: 13.6 % (ref 11.5–15.5)
RDW: 13.9 % (ref 11.5–15.5)
WBC: 12.2 10*3/uL — ABNORMAL HIGH (ref 4.0–10.5)
WBC: 8.5 10*3/uL (ref 4.0–10.5)
nRBC: 0 % (ref 0.0–0.2)
nRBC: 0 % (ref 0.0–0.2)

## 2021-10-17 LAB — PROTIME-INR
INR: 1.1 (ref 0.8–1.2)
Prothrombin Time: 13.9 seconds (ref 11.4–15.2)

## 2021-10-17 LAB — COMPREHENSIVE METABOLIC PANEL
ALT: 22 U/L (ref 0–44)
AST: 26 U/L (ref 15–41)
Albumin: 2.9 g/dL — ABNORMAL LOW (ref 3.5–5.0)
Alkaline Phosphatase: 60 U/L (ref 38–126)
Anion gap: 8 (ref 5–15)
BUN: 37 mg/dL — ABNORMAL HIGH (ref 8–23)
CO2: 21 mmol/L — ABNORMAL LOW (ref 22–32)
Calcium: 8.8 mg/dL — ABNORMAL LOW (ref 8.9–10.3)
Chloride: 105 mmol/L (ref 98–111)
Creatinine, Ser: 1 mg/dL (ref 0.44–1.00)
GFR, Estimated: >60 mL/min (ref >60)
Glucose, Bld: 137 mg/dL — ABNORMAL HIGH (ref 70–99)
Potassium: 3.9 mmol/L (ref 3.5–5.1)
Sodium: 134 mmol/L — ABNORMAL LOW (ref 135–145)
Total Bilirubin: 0.9 mg/dL (ref 0.3–1.2)
Total Protein: 5.8 g/dL — ABNORMAL LOW (ref 6.5–8.1)

## 2021-10-17 LAB — POC OCCULT BLOOD, ED: Fecal Occult Bld: POSITIVE — AB

## 2021-10-17 LAB — TROPONIN I (HIGH SENSITIVITY)
Troponin I (High Sensitivity): 9 ng/L (ref <18)
Troponin I (High Sensitivity): 9 ng/L (ref <18)

## 2021-10-17 LAB — CBG MONITORING, ED: Glucose-Capillary: 121 mg/dL — ABNORMAL HIGH (ref 70–99)

## 2021-10-17 LAB — ABO/RH: ABO/RH(D): B POS

## 2021-10-17 SURGERY — ESOPHAGOGASTRODUODENOSCOPY (EGD) WITH PROPOFOL
Anesthesia: Monitor Anesthesia Care

## 2021-10-17 MED ORDER — PROPOFOL 500 MG/50ML IV EMUL
INTRAVENOUS | Status: DC | PRN
  Administered 2021-10-17: 150 ug/kg/min via INTRAVENOUS

## 2021-10-17 MED ORDER — SODIUM CHLORIDE 0.9 % IV BOLUS
1000.0000 mL | Freq: Once | INTRAVENOUS | Status: AC
  Administered 2021-10-17: 1000 mL via INTRAVENOUS

## 2021-10-17 MED ORDER — FLUOXETINE HCL 20 MG PO CAPS
40.0000 mg | ORAL_CAPSULE | Freq: Every day | ORAL | Status: DC
  Administered 2021-10-17 – 2021-10-21 (×5): 40 mg via ORAL
  Filled 2021-10-17 (×5): qty 2

## 2021-10-17 MED ORDER — ONDANSETRON HCL 4 MG/2ML IJ SOLN: 4.0000 mg | Freq: Four times a day (QID) | INTRAMUSCULAR | Status: DC | PRN

## 2021-10-17 MED ORDER — ACETAMINOPHEN 650 MG RE SUPP: 650.0000 mg | Freq: Four times a day (QID) | RECTAL | Status: DC | PRN

## 2021-10-17 MED ORDER — SODIUM CHLORIDE 0.9 % IV SOLN: Freq: Once | INTRAVENOUS | Status: AC

## 2021-10-17 MED ORDER — GABAPENTIN 300 MG PO CAPS
600.0000 mg | ORAL_CAPSULE | Freq: Every evening | ORAL | Status: DC
  Administered 2021-10-17 – 2021-10-20 (×4): 600 mg via ORAL
  Filled 2021-10-17 (×4): qty 2

## 2021-10-17 MED ORDER — HYDRALAZINE HCL 20 MG/ML IJ SOLN: 5.0000 mg | INTRAMUSCULAR | Status: DC | PRN

## 2021-10-17 MED ORDER — LACTATED RINGERS IV SOLN: INTRAVENOUS | Status: DC

## 2021-10-17 MED ORDER — PANTOPRAZOLE SODIUM 40 MG IV SOLR
40.0000 mg | Freq: Once | INTRAVENOUS | Status: AC
  Administered 2021-10-17: 40 mg via INTRAVENOUS
  Filled 2021-10-17: qty 10

## 2021-10-17 MED ORDER — ACETAMINOPHEN 325 MG PO TABS
650.0000 mg | ORAL_TABLET | Freq: Four times a day (QID) | ORAL | Status: DC | PRN
  Administered 2021-10-18 – 2021-10-19 (×2): 650 mg via ORAL
  Filled 2021-10-17 (×2): qty 2

## 2021-10-17 MED ORDER — ONDANSETRON HCL 4 MG PO TABS: 4.0000 mg | ORAL_TABLET | Freq: Four times a day (QID) | ORAL | Status: DC | PRN

## 2021-10-17 MED ORDER — ATORVASTATIN CALCIUM 80 MG PO TABS
80.0000 mg | ORAL_TABLET | Freq: Every day | ORAL | Status: DC
Start: 2021-10-17 — End: 2021-10-22
  Administered 2021-10-17 – 2021-10-21 (×5): 80 mg via ORAL
  Filled 2021-10-17 (×5): qty 1

## 2021-10-17 MED ORDER — LACTATED RINGERS IV SOLN: INTRAVENOUS | Status: DC | PRN

## 2021-10-17 MED ORDER — ONDANSETRON HCL 4 MG/2ML IJ SOLN
4.0000 mg | Freq: Once | INTRAMUSCULAR | Status: AC
  Administered 2021-10-17: 4 mg via INTRAVENOUS
  Filled 2021-10-17: qty 2

## 2021-10-17 MED ORDER — PROMETHAZINE HCL 25 MG/ML IJ SOLN: 6.2500 mg | INTRAMUSCULAR | Status: DC | PRN

## 2021-10-17 MED ORDER — SODIUM CHLORIDE 0.9 % IV SOLN: INTRAVENOUS | Status: DC

## 2021-10-17 MED ORDER — POLYVINYL ALCOHOL 1.4 % OP SOLN: 1.0000 [drp] | Freq: Every day | OPHTHALMIC | Status: DC | PRN

## 2021-10-17 MED ORDER — OXYCODONE HCL 5 MG PO TABS: 5.0000 mg | ORAL_TABLET | Freq: Once | ORAL | Status: DC | PRN

## 2021-10-17 MED ORDER — OXYCODONE HCL 5 MG/5ML PO SOLN: 5.0000 mg | Freq: Once | ORAL | Status: DC | PRN

## 2021-10-17 MED ORDER — PANTOPRAZOLE INFUSION (NEW) - SIMPLE MED
8.0000 mg/h | INTRAVENOUS | Status: DC
  Administered 2021-10-17 – 2021-10-18 (×3): 8 mg/h via INTRAVENOUS
  Filled 2021-10-17 (×2): qty 80
  Filled 2021-10-17: qty 100
  Filled 2021-10-17: qty 80

## 2021-10-17 MED ORDER — LEVOTHYROXINE SODIUM 112 MCG PO TABS
112.0000 ug | ORAL_TABLET | Freq: Every day | ORAL | Status: DC
  Administered 2021-10-18 – 2021-10-21 (×4): 112 ug via ORAL
  Filled 2021-10-17 (×4): qty 1

## 2021-10-17 SURGICAL SUPPLY — 15 items

## 2021-10-17 NOTE — Op Note (Signed)
[facility] ?Patient Name: Cassandra Rios ?Procedure Date : 10/17/2021 ?MRN: [id number] ?Attending MD: Willis Modena , MD ?Date of Birth: Sep 18, 1950 ?CSN: [id number] ?Age: 71 ?Admit Type: Outpatient ?Procedure:                Upper GI endoscopy ?Indications:              Acute post hemorrhagic anemia, Hematemesis, Melena ?Providers:                Willis Modena, MD, Fransisca Connors, Debbora Presto  ?                          Lyman Bishop, Technician ?Referring MD:              ?Medicines:                Monitored Anesthesia Care ?Complications:            No immediate complications. ?Estimated Blood Loss:     Estimated blood loss: none. ?Procedure:                Pre-Anesthesia Assessment: ?                          - Prior to the procedure, a History and Physical  ?                          was performed, and patient medications and  ?                          allergies were reviewed. The patient's tolerance of  ?                          previous anesthesia was also reviewed. The risks  ?                          and benefits of the procedure and the sedation  ?                          options and risks were discussed with the patient.  ?                          All questions were answered, and informed consent  ?                          was obtained. Prior Anticoagulants: The patient has  ?                          taken no previous anticoagulant or antiplatelet  ?                          agents. ASA Grade Assessment: III - A patient with  ?                          severe systemic disease. After reviewing the risks  ?  and benefits, the patient was deemed in  ?                          satisfactory condition to undergo the procedure. ?                          After obtaining informed consent, the endoscope was  ?                          passed under direct vision. Throughout the  ?                          procedure, the patient's blood pressure, pulse, and  ?                           oxygen saturations were monitored continuously. The  ?                          [id number]-[id number] ([id number]) Olympus endoscope was introduced  ?                          through the mouth, and advanced to the second part  ?                          of duodenum. The upper GI endoscopy was  ?                          accomplished without difficulty. The patient  ?                          tolerated the procedure well. ?Scope In: ?Scope Out: ?Findings: ?     The examined esophagus was normal. ?     Patchy mild inflammation was found in the entire examined stomach. ?     Two non-bleeding cratered gastric ulcers with pigmented material were  ?     found in the prepyloric region of the stomach. The largest lesion was 8  ?     mm in largest dimension. ?     The exam of the stomach was otherwise normal. ?     The in the duodenum was normal. ?     No old or fresh blood was seen to the extent of our examination. ?Impression:               - Normal esophagus. ?                          - Gastritis. ?                          - Non-bleeding gastric ulcers with pigmented  ?                          material. ?                          - Normal. ?                          -  No specimens collected. ?Recommendation:           - Return patient to hospital ward for ongoing care. ?                          - Clear liquid diet today. ?                          - Continue present medications. ?                          - Check H. pylori serologies. ?                          - No ASA/NSAIDs. ?                          - [facility] will follow. ?Procedure Code(s):        --- Professional --- ?                          [phone], Esophagogastroduodenoscopy, flexible,  ?                          transoral; diagnostic, including collection of  ?                          specimen(s) by brushing or washing, when performed  ?                          (separate procedure) ?Diagnosis Code(s):        --- Professional --- ?                           K29.70, Gastritis, unspecified, without bleeding ?                          K25.9, Gastric ulcer, unspecified as acute or  ?                          chronic, without hemorrhage or perforation ?                          D62, Acute posthemorrhagic anemia ?                          K92.0, Hematemesis ?                          K92.1, Melena (includes Hematochezia) ?CPT copyright 2019 American Medical Association. All rights reserved. ?The codes documented in this report are preliminary and upon coder review may  ?be revised to meet current compliance requirements. ?Willis Modena, MD ?10/17/2021 11:17:26 AM ?This report has been signed electronically. ?Number of Addenda: 0 ?

## 2021-10-17 NOTE — Interval H&P Note (Signed)
History and Physical Interval Note: ? ?10/17/2021 ?9:57 AM ? ?Cassandra Rios  has presented today for surgery, with the diagnosis of hemaemesis.  The various methods of treatment have been discussed with the patient and family. After consideration of risks, benefits and other options for treatment, the patient has consented to  Procedure(s): ?ESOPHAGOGASTRODUODENOSCOPY (EGD) WITH PROPOFOL (N/A) as a surgical intervention.  The patient's history has been reviewed, patient examined, no change in status, stable for surgery.  I have reviewed the patient's chart and labs.  Questions were answered to the patient's satisfaction.   ? ? ?Landry Dyke ? ? ?

## 2021-10-17 NOTE — Anesthesia Postprocedure Evaluation (Signed)
Anesthesia Post Note ? ?Patient: Cassandra Rios ? ?Procedure(s) Performed: ESOPHAGOGASTRODUODENOSCOPY (EGD) WITH PROPOFOL ? ?  ? ?Patient location during evaluation: PACU ?Anesthesia Type: MAC ?Level of consciousness: awake and alert ?Pain management: pain level controlled ?Vital Signs Assessment: post-procedure vital signs reviewed and stable ?Respiratory status: spontaneous breathing, nonlabored ventilation and respiratory function stable ?Cardiovascular status: blood pressure returned to baseline and stable ?Postop Assessment: no apparent nausea or vomiting ?Anesthetic complications: no ? ? ?No notable events documented. ? ?Last Vitals:  ?Vitals:  ? 10/17/21 1125 10/17/21 1140  ?BP: (!) 114/58 (!) 127/101  ?Pulse: 73 70  ?Resp: 15 20  ?Temp: 36.6 ?C   ?SpO2: 100% 100%  ?  ?Last Pain:  ?Vitals:  ? 10/17/21 1125  ?TempSrc:   ?PainSc: 0-No pain  ? ? ?  ?  ?  ?  ?  ?  ? ?Lynda Rainwater ? ? ? ? ?

## 2021-10-17 NOTE — ED Provider Notes (Signed)
?[facility] ?Provider Note ? ?CSN: [id number] ?Arrival date & time: 10/17/21 0318 ? ?Chief Complaint(s) ?Loss of Consciousness, N/V/D, Hematemesis, and Bloody Stools ? ?HPI ?Cassandra Rios is a 71 y.o. female with a past medical history listed below including hypertension, hyperlipidemia, multiple sclerosis on DMD, prior peptic ulcers from NSAID use here for syncopal episode this evening while having a bowel movement.  Patient does not remember how long she was down.  Her husband awoke to find her on the bathroom floor.  Patient reports that she has had several bouts of bloody emesis and melenic stools in the last 18 hours. ? ?Patient reports that she takes a daily baby aspirin.  She denies any other NSAID use.  She is not on anticoagulation. ? ?She denies any associated chest pain prior during the episodes however in route with EMS she did complain of substernal discomfort that was nonradiating.  Currently asymptomatic no associated shortness of breath.  No abdominal pain.  ? ? ? ?Loss of Consciousness ? ?Past Medical History ?Past Medical History:  ?Diagnosis Date  ? AAA (abdominal aortic aneurysm) ([place])   ? Allergic rhinitis   ? Chronic hoarseness   ? Colon polyps   ? COPD (chronic obstructive pulmonary disease) ([place])   ? Dr. Chase Caller  ? Dyslipidemia   ? Hypertension   ? Hypothyroidism   ? MS (multiple sclerosis) ([place])   ? Obesity   ? Stomach ulcer from aspirin/ibuprofen-like drugs (NSAID's)   ? ?Patient Active Problem List  ? Diagnosis Date Noted  ? Misadventure of surgical procedure 09/28/2021  ? COVID-19 02/22/2021  ? COPD (chronic obstructive pulmonary disease) ([place])   ? Lumbar spondylosis 11/05/2020  ? Lumbar pain 09/14/2020  ? Urinary incontinence, mixed 07/07/2020  ? Chronic midline low back pain without sciatica 04/20/2020  ? 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 Abdominal aortic atherosclerosis ([place]) 03/09/2020  ? Chronic back pain 03/09/2020   ? Cellulitis of breast 02/03/2020  ? Flank pain 02/03/2020  ? CAD (coronary artery disease), native coronary artery 07/09/2019  ? DOE (dyspnea on exertion) 04/21/2019  ? Preventative health care 04/21/2019  ? AAA (abdominal aortic aneurysm) ([place]) 04/19/2018  ? Chronic headaches 04/19/2018  ? Lichen sclerosus et atrophicus 03/31/2018  ? Sinus headache 03/18/2018  ? Closed fracture of right distal fibula 12/29/2017  ? Ankle fracture 12/29/2017  ? 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 Impingement syndrome of left shoulder region 11/05/2017  ? Hyperlipidemia LDL goal <100 09/27/2017  ? Senile purpura ([place]) 03/29/2017  ? Lichen sclerosus of female genitalia 10/23/2016  ? Breast pain, left 03/27/2016  ? Chest pain 03/27/2016  ? HTN (hypertension) 09/24/2015  ? Sinusitis, acute 03/23/2015  ? Depression 07/06/2014  ? Thoracic back pain 07/06/2014  ? UTI (urinary tract infection) 04/28/2014  ? Cellulitis and abscess of trunk 02/16/2014  ? Severe obesity (BMI >= 40) ([place]) 06/30/2013  ? Breast mass, right 01/10/2013  ? Acute sinusitis 03/22/2009  ? MULTIPLE SCLEROSIS, RELAPSING/REMITTING 01/04/2009  ? THROMBOCYTOPENIA 09/15/2008  ? SHOULDER PAIN, LEFT 09/15/2008  ? BARIATRIC SURGERY STATUS 09/15/2008  ? Hypothyroidism 07/20/2008  ? HIATAL HERNIA 07/20/2008  ? COPD with acute bronchitis ([place]) 07/23/2007  ? ABNORMAL PULMONARY TEST RESULTS 07/23/2007  ? Pulmonary function studies abnormal 07/23/2007  ? Allergic rhinitis 06/26/2007  ? HOARSENESS, CHRONIC 06/26/2007  ? WHEEZING 06/26/2007  ? SNORING 06/26/2007  ? COUGH 06/26/2007  ? TOBACCO ABUSE-HISTORY OF 06/26/2007  ? COLONIC POLYPS, HYPERPLASTIC 06/25/2007  ?  Hyperlipidemia 06/25/2007  ? MORBID OBESITY 06/25/2007  ? Depression with anxiety 06/25/2007  ? GASTRIC ULCER 06/25/2007  ? COLONIC POLYPS, ADENOMATOUS, HX OF 06/25/2007  ? Anxiety 06/25/2007  ? ?Home Medication(s) ?Prior to Admission medications   ?Medication Sig Start Date End Date Taking? Authorizing  Provider  ?acetaminophen (TYLENOL) 500 MG tablet Take 1,000 mg by mouth every 6 (six) hours as needed for moderate pain or headache.   Yes [provider]  ?aspirin EC 81 MG tablet Take 1 tablet (81 mg total) by mouth daily. 07/03/19  Yes Isaiah Serge, NP  ?atorvastatin (LIPITOR) 80 MG tablet Take 1 tablet (80 mg total) by mouth daily. 09/21/21  Yes Belva Crome, MD  ?calcium carbonate (TUMS - DOSED IN MG ELEMENTAL CALCIUM) 500 MG chewable tablet Chew 2 tablets by mouth daily as needed for indigestion or heartburn.   Yes [provider]  ?FLUoxetine (PROZAC) 40 MG capsule Take 1 capsule (40 mg total) by mouth daily. 05/24/21  Yes Roma Schanz R, DO  ?gabapentin (NEURONTIN) 300 MG capsule Take 600 mg by mouth every evening.  09/19/18  Yes [provider]  ?HYDROcodone-acetaminophen (NORCO/VICODIN) 5-325 MG per tablet Take 0.5 tablets by mouth 2 (two) times daily as needed for moderate pain.    Yes [provider]  ?Interferon Beta-1b (BETASERON/EXTAVIA) 0.3 MG KIT injection Inject 0.25 mg into the skin every other day.   Yes [provider]  ?levocetirizine (XYZAL) 5 MG tablet Take 1 tablet (5 mg total) by mouth every evening. ?Patient taking differently: Take 5 mg by mouth daily as needed for allergies. 08/25/21  Yes White, Leitha Schuller, NP  ?levothyroxine (SYNTHROID) 112 MCG tablet TAKE 1 TABLET BY MOUTH EVERY DAY BEFORE BREAKFAST ?Patient taking differently: Take 112 mcg by mouth daily before breakfast. 07/19/21  Yes Ann Held, DO  ?mometasone (NASONEX) 50 MCG/ACT nasal spray Place 2 sprays into the nose daily as needed (allergies).   Yes [provider]  ?nitroGLYCERIN (NITROSTAT) 0.4 MG SL tablet Place 1 tablet (0.4 mg total) under the tongue every 5 (five) minutes as needed for chest pain. 10/07/21  Yes Belva Crome, MD  ?Polyethyl Glycol-Propyl Glycol (SYSTANE ULTRA OP) Place 1 drop into the right eye daily as needed (dry eye).    Yes  [provider]  ?silver sulfADIAZINE (SILVADENE) 1 % cream Apply 1 application. topically daily as needed (irritated areas under abdomen). 09/03/20  Yes [provider]  ?telmisartan-hydrochlorothiazide (MICARDIS HCT) 40-12.5 MG tablet TAKE 1/2 TABLET BY MOUTH EVERY DAY ?Patient taking differently: Take 0.5 tablets by mouth daily. 02/28/21  Yes Belva Crome, MD  ?triamcinolone ointment (KENALOG) 0.1 % Apply 1 application. topically daily as needed (rash). 09/03/20  Yes [provider]  ?amoxicillin-clavulanate (AUGMENTIN) 875-125 MG tablet Take 1 tablet by mouth every 12 (twelve) hours. ?Patient not taking: Reported on 10/17/2021 08/25/21   Hans Eden, NP  ?                                                                                                                                  ?  Allergies ?Codeine, Hydromorphone hcl, Ibuprofen, Morphine sulfate, and Metronidazole ? ?Review of Systems ?Review of Systems  ?Cardiovascular:  Positive for syncope.  ?As noted in HPI ? ?Physical Exam ?Vital Signs  ?I have reviewed the triage vital signs ?BP 95/63   Pulse 83   Temp (!) 97.5 ?F (36.4 ?C) (Oral)   Resp 18   Ht 5' 6"  (1.676 m)   Wt 106.6 kg   SpO2 100%   BMI 37.93 kg/m?  ? ?Physical Exam ?Vitals reviewed.  ?Constitutional:   ?   General: She is not in acute distress. ?   Appearance: She is well-developed. She is obese. She is not diaphoretic.  ?   Interventions: Cervical collar in place.  ?HENT:  ?   Head: Normocephalic and atraumatic.  ?   Nose: Nose normal.  ?Eyes:  ?   General: No scleral icterus.    ?   Right eye: No discharge.     ?   Left eye: No discharge.  ?   Conjunctiva/sclera: Conjunctivae normal.  ?   Pupils: Pupils are equal, round, and reactive to light.  ?Cardiovascular:  ?   Rate and Rhythm: Normal rate and regular rhythm.  ?   Heart sounds: No murmur heard. ?  No friction rub. No gallop.  ?Pulmonary:  ?   Effort: Pulmonary effort is normal. No respiratory distress.   ?   Breath sounds: Normal breath sounds. No stridor. No rales.  ?Abdominal:  ?   General: There is no distension.  ?   Palpations: Abdomen is soft.  ?   Tenderness: There is no abdominal tenderness.  ?Musculoskel

## 2021-10-17 NOTE — Consult Note (Signed)
[facility] Consultation Note ? ?Referring Provider: No ref. provider found ?Primary Care Physician:  Carollee Herter, Alferd Apa, DO ?Primary Gastroenterologist:  Dr. Watt Climes ? ?Reason for Consultation:  melena, anemia, hematemesis ? ?HPI: Cassandra Rios is a 71 y.o. female with recent history hematemesis (vs hemoptysis), melena, epigastric discomfort.  Remote history gastric ulcer 20+ years ago.  No NSAIDs.   ? ? ?Past Medical History:  ?Diagnosis Date  ? AAA (abdominal aortic aneurysm) ([place])   ? Allergic rhinitis   ? Chronic hoarseness   ? Colon polyps   ? COPD (chronic obstructive pulmonary disease) ([place])   ? Dr. Chase Caller  ? Dyslipidemia   ? Hypertension   ? Hypothyroidism   ? MS (multiple sclerosis) ([place])   ? Obesity   ? Stomach ulcer from aspirin/ibuprofen-like drugs (NSAID's)   ? ? ?Past Surgical History:  ?Procedure Laterality Date  ? ABDOMINAL HYSTERECTOMY    ? BARIATRIC SURGERY  09/07/08  ? lap band  ? CARPAL TUNNEL RELEASE    ? CATARACT EXTRACTION Right 11/08/2017  ? CATARACT EXTRACTION Left 11/22/2017  ? CATARACT EXTRACTION, BILATERAL    ?  Toric lenses for astigmatism  ? CHOLECYSTECTOMY    ? CHOLECYSTECTOMY    ? HIATAL HERNIA REPAIR  09/07/08  ? LEFT HEART CATH AND CORONARY ANGIOGRAPHY N/A 07/09/2019  ? Procedure: LEFT HEART CATH AND CORONARY ANGIOGRAPHY;  Surgeon: Belva Crome, MD;  Location: [facility];  Service: Cardiovascular;  Laterality: N/A;  ? ? ?Prior to Admission medications   ?Medication Sig Start Date End Date Taking? Authorizing Provider  ?acetaminophen (TYLENOL) 500 MG tablet Take 1,000 mg by mouth every 6 (six) hours as needed for moderate pain or headache.   Yes [provider]  ?aspirin EC 81 MG tablet Take 1 tablet (81 mg total) by mouth daily. 07/03/19  Yes Isaiah Serge, NP  ?atorvastatin (LIPITOR) 80 MG tablet Take 1 tablet (80 mg total) by mouth daily. 09/21/21  Yes Belva Crome, MD  ?calcium carbonate (TUMS - DOSED IN MG ELEMENTAL CALCIUM) 500 MG chewable  tablet Chew 2 tablets by mouth daily as needed for indigestion or heartburn.   Yes [provider]  ?FLUoxetine (PROZAC) 40 MG capsule Take 1 capsule (40 mg total) by mouth daily. 05/24/21  Yes Roma Schanz R, DO  ?gabapentin (NEURONTIN) 300 MG capsule Take 600 mg by mouth every evening.  09/19/18  Yes [provider]  ?HYDROcodone-acetaminophen (NORCO/VICODIN) 5-325 MG per tablet Take 0.5 tablets by mouth 2 (two) times daily as needed for moderate pain.    Yes [provider]  ?Interferon Beta-1b (BETASERON/EXTAVIA) 0.3 MG KIT injection Inject 0.25 mg into the skin every other day.   Yes [provider]  ?levocetirizine (XYZAL) 5 MG tablet Take 1 tablet (5 mg total) by mouth every evening. ?Patient taking differently: Take 5 mg by mouth daily as needed for allergies. 08/25/21  Yes White, Leitha Schuller, NP  ?levothyroxine (SYNTHROID) 112 MCG tablet TAKE 1 TABLET BY MOUTH EVERY DAY BEFORE BREAKFAST ?Patient taking differently: Take 112 mcg by mouth daily before breakfast. 07/19/21  Yes Ann Held, DO  ?mometasone (NASONEX) 50 MCG/ACT nasal spray Place 2 sprays into the nose daily as needed (allergies).   Yes [provider]  ?nitroGLYCERIN (NITROSTAT) 0.4 MG SL tablet Place 1 tablet (0.4 mg total) under the tongue every 5 (five) minutes as needed for chest pain. 10/07/21  Yes Belva Crome, MD  ?Polyethyl Glycol-Propyl  Glycol (SYSTANE ULTRA OP) Place 1 drop into the right eye daily as needed (dry eye).    Yes [provider]  ?silver sulfADIAZINE (SILVADENE) 1 % cream Apply 1 application. topically daily as needed (irritated areas under abdomen). 09/03/20  Yes [provider]  ?telmisartan-hydrochlorothiazide (MICARDIS HCT) 40-12.5 MG tablet TAKE 1/2 TABLET BY MOUTH EVERY DAY ?Patient taking differently: Take 0.5 tablets by mouth daily. 02/28/21  Yes Belva Crome, MD  ?triamcinolone ointment (KENALOG) 0.1 % Apply 1 application. topically daily  as needed (rash). 09/03/20  Yes [provider]  ? ? ?Current Facility-Administered Medications  ?Medication Dose Route Frequency Provider Last Rate Last Admin  ? 0.9 %  sodium chloride infusion   Intravenous Continuous Cardama, Grayce Sessions, MD      ? 0.9 %  sodium chloride infusion   Intravenous Continuous Arta Silence, MD      ? pantoprozole (PROTONIX) 80 mg /NS 100 mL infusion  8 mg/hr Intravenous Continuous Cardama, Grayce Sessions, MD 10 mL/hr at 10/17/21 0652 8 mg/hr at 10/17/21 5284  ? ? ?Allergies as of 10/17/2021 - Review Complete 10/17/2021  ?Allergen Reaction Noted  ? Codeine Itching and Nausea And Vomiting   ? Hydromorphone hcl Nausea And Vomiting   ? Ibuprofen Other (See Comments)   ? Morphine sulfate  06/26/2007  ? Metronidazole Hives, Nausea And Vomiting, and Rash 11/06/2016  ? ? ?Family History  ?Problem Relation Age of Onset  ? Stroke Mother 30  ? Heart disease Mother   ?     congenital heart defect?  ? COPD Father   ? Heart disease Father   ? AAA (abdominal aortic aneurysm) Father   ? COPD Sister   ? Arthritis Sister   ? AAA (abdominal aortic aneurysm) Sister   ? Dementia Sister   ? Other Sister   ?     brain injury  ? Multiple sclerosis Neg Hx   ? ? ?Social History  ? ?Socioeconomic History  ? Marital status: Married  ?  Spouse name: Legrand Como  ? Number of children: 2  ? Years of education: 35  ? Highest education level: Not on file  ?Occupational History  ? Occupation: retired  ?Tobacco Use  ? Smoking status: Former  ?  Packs/day: 2.00  ?  Years: 40.00  ?  Pack years: 80.00  ?  Types: Cigarettes  ?  Quit date: 04/05/2007  ?  Years since quitting: 14.5  ?  Passive exposure: Current (Husband)  ? Smokeless tobacco: Never  ?Vaping Use  ? Vaping Use: Not on file  ?Substance and Sexual Activity  ? Alcohol use: No  ? Drug use: No  ? Sexual activity: Never  ?  Partners: Male  ?Other Topics Concern  ? Not on file  ?Social History Narrative  ? Patient is right handed,reside in home with husband   ? ?Social Determinants of Health  ? ?Financial Resource Strain: Not on file  ?Food Insecurity: Not on file  ?Transportation Needs: Not on file  ?Physical Activity: Not on file  ?Stress: Not on file  ?Social Connections: Not on file  ?Intimate Partner Violence: Not on file  ? ? ?Review of Systems: As per HPI, all others negative ? ?Physical Exam: ?Vital signs in last 24 hours: ?Temp:  [97.5 ?F (36.4 ?C)-97.7 ?F (36.5 ?C)] 97.7 ?F (36.5 ?C) (05/15 1324) ?Pulse Rate:  [75-93] 75 (05/15 0936) ?Resp:  [10-22] 14 (05/15 0936) ?BP: (86-131)/(46-79) 108/48 (05/15 0936) ?SpO2:  [98 %-100 %] 100 % (  05/15 0936) ?Weight:  [106.6 kg] 106.6 kg (05/15 0936) ?  ?General:   Alert,  Well-developed, well-nourished, pleasant and cooperative in NAD ?Head:  Normocephalic and atraumatic. ?Eyes:  Sclera clear, no icterus.   Conjunctiva pink. ?Ears:  Normal auditory acuity. ?Nose:  No deformity, discharge,  or lesions. ?Mouth:  No deformity or lesions.  Oropharynx pink & moist. ?Neck:  Supple; no masses or thyromegaly. ?Lungs:  Clear throughout to auscultation.   No wheezes, crackles, or rhonchi. No acute distress. ?Heart:  Regular rate and rhythm; no murmurs, clicks, rubs,  or gallops. ?Abdomen:  Soft, mild epigastric tenderness without peritonitis, and nondistended. No masses, hepatosplenomegaly or hernias noted. Normal bowel sounds, without guarding, and without rebound.     ?Msk:  Symmetrical without gross deformities. Normal posture. ?Pulses:  Normal pulses noted. ?Extremities:  Without clubbing or edema. ?Neurologic:  Alert and  oriented x4;  grossly normal neurologically. ?Skin:  Intact without significant lesions or rashes. ?Psych:  Alert and cooperative. Normal mood and affect. ? ? ?Lab Results: ?Recent Labs  ?  10/17/21 ?0404 10/17/21 ?4383  ?WBC 12.2*  --   ?HGB 11.1* 11.2*  ?HCT 32.7* 33.0*  ?PLT 227  --   ? ?BMET ?Recent Labs  ?  10/17/21 ?0404 10/17/21 ?0420  ?NA 134* 135  ?K 3.9 3.9  ?CL 105 103  ?CO2 21*  --   ?GLUCOSE 137*  130*  ?BUN 37* 36*  ?CREATININE 1.00 0.90  ?CALCIUM 8.8*  --   ? ?LFT ?Recent Labs  ?  10/17/21 ?0404  ?PROT 5.8*  ?ALBUMIN 2.9*  ?AST 26  ?ALT 22  ?ALKPHOS 60  ?BILITOT 0.9  ? ?PT/INR ?Recent Labs  ?  05/

## 2021-10-17 NOTE — ED Notes (Signed)
Pt stated she was extremely fatigue after standing for orthostatic BP and could not bare the entire time. Pt requested to sit back down after feeling lightheaded and "needing to sit down". Pt stated lying down is the best position. Provider notified.  ?

## 2021-10-17 NOTE — ED Triage Notes (Signed)
Pt bib [facility] from home for N/V/D last 24 hours. Black, tarry diarrhea and bright red blood being spit up. [place] last week and wasn't seen by healthcare provider, NOT on blood thinners. Pt's husband found pt conscious on the floor this AM following unwitnessed syncopal episode. Not currently c/o pain, initially felt neck and back pain upon EMS arrival to their home. Pt on 4L oxygen for room air sats 83%. Arrives in EMS cervical collar.  ? ?114 BP palpated ?HR 90 ? ?20G IV L AC, no meds/fluids given  ?

## 2021-10-17 NOTE — H&P (Signed)
?History and Physical  ? ? ?Patient: Cassandra Rios XBJ:[id number] DOB: 06/08/1950 ?DOA: 10/17/2021 ?DOS: the patient was seen and examined on 10/17/2021 ?PCP: [name], DO  ?Patient coming from: Home - lives with husband; NOKEdmund Hilda, [phone] or [phone] ? ? ?Chief Complaint: Syncope ? ?HPI: Cassandra Rios is a 71 y.o. 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  She reports that she had n/v since Sunday AM.   She got up to the bathroom overnight and passed out on the commode.  She was bleeding, hit her head.  She had been nauseated throughout the day Sunday and was drinking sips of liquid and she was vomiting with some blood in it.  She had been constipated for a couple of days and during her syncopal episode she noticed continuous tarry BMs.  She is still having some nausea.  Last emesis was at home last night.  Her head is continuing to hurt in the ER.   Years ago, she had an ulcer in the past and she does not take ibuprofen.  She takes Tylenol only for pain; she has an rx for Tramadol but has not had to take it. ? ? ? ?ER Course:  Syncope.  Remote h/o ulcer.  Hematemesis, melena.  Hgb 11, down from 14.  Orthostatic.  No emesis in the ER.  On Protonix drip.  [facility] will consult, EGD today.  Chest discomfort, EKG unchanged, trop negative. ? ? ? ? ?Review of Systems: As mentioned in the history of present illness. All other systems reviewed and are negative. ?Past Medical History:  ?Diagnosis Date  ? AAA (abdominal aortic aneurysm) ([facility])   ? Allergic rhinitis   ? Chronic hoarseness   ? Colon polyps   ? COPD (chronic obstructive pulmonary disease) ([facility])   ? Dr. Marchelle Gearing  ? Dyslipidemia   ? Hypertension   ? Hypothyroidism   ? MS (multiple sclerosis) ([facility])   ? Obesity   ? Stomach ulcer from aspirin/ibuprofen-like drugs (NSAID's)   ? ?Past Surgical History:   ?Procedure Laterality Date  ? ABDOMINAL HYSTERECTOMY    ? BARIATRIC SURGERY  09/07/08  ? lap band  ? CARPAL TUNNEL RELEASE    ? CATARACT EXTRACTION Right 11/08/2017  ? CATARACT EXTRACTION Left 11/22/2017  ? CATARACT EXTRACTION, BILATERAL    ?  Toric lenses for astigmatism  ? CHOLECYSTECTOMY    ? CHOLECYSTECTOMY    ? HIATAL HERNIA REPAIR  09/07/08  ? LEFT HEART CATH AND CORONARY ANGIOGRAPHY N/A 07/09/2019  ? Procedure: LEFT HEART CATH AND CORONARY ANGIOGRAPHY;  Surgeon: Lyn Records, MD;  Location: [facility];  Service: Cardiovascular;  Laterality: N/A;  ? ?Social History:  reports that she quit smoking about 14 years ago. Her smoking use included cigarettes. She has a 80.00 pack-year smoking history. She has been exposed to tobacco smoke. She has never used smokeless tobacco. She reports that she does not drink alcohol and does not use drugs. ? ?Allergies  ?Allergen Reactions  ? Codeine Itching and Nausea And Vomiting  ? Hydromorphone Hcl Nausea And Vomiting  ? Ibuprofen Other (See Comments)  ?  stomach ulcers  ? Morphine Sulfate   ?  "makes my blood pressure bottom out"  ? Metronidazole Hives, Nausea And Vomiting and Rash  ? ? ?Family History  ?Problem Relation Age of Onset  ? Stroke Mother 45  ?  Heart disease Mother   ?     congenital heart defect?  ? COPD Father   ? Heart disease Father   ? AAA (abdominal aortic aneurysm) Father   ? COPD Sister   ? Arthritis Sister   ? AAA (abdominal aortic aneurysm) Sister   ? Dementia Sister   ? Other Sister   ?     brain injury  ? Multiple sclerosis Neg Hx   ? ? ?Prior to Admission medications   ?Medication Sig Start Date End Date Taking? Authorizing Provider  ?acetaminophen (TYLENOL) 500 MG tablet Take 1,000 mg by mouth every 6 (six) hours as needed for moderate pain or headache.   Yes [provider]  ?aspirin EC 81 MG tablet Take 1 tablet (81 mg total) by mouth daily. 07/03/19  Yes Leone Brand, NP  ?atorvastatin (LIPITOR) 80 MG tablet Take 1 tablet (80 mg  total) by mouth daily. 09/21/21  Yes Lyn Records, MD  ?calcium carbonate (TUMS - DOSED IN MG ELEMENTAL CALCIUM) 500 MG chewable tablet Chew 2 tablets by mouth daily as needed for indigestion or heartburn.   Yes [provider]  ?FLUoxetine (PROZAC) 40 MG capsule Take 1 capsule (40 mg total) by mouth daily. 05/24/21  Yes Seabron Spates R, DO  ?gabapentin (NEURONTIN) 300 MG capsule Take 600 mg by mouth every evening.  09/19/18  Yes [provider]  ?HYDROcodone-acetaminophen (NORCO/VICODIN) 5-325 MG per tablet Take 0.5 tablets by mouth 2 (two) times daily as needed for moderate pain.    Yes [provider]  ?Interferon Beta-1b (BETASERON/EXTAVIA) 0.3 MG KIT injection Inject 0.25 mg into the skin every other day.   Yes [provider]  ?levocetirizine (XYZAL) 5 MG tablet Take 1 tablet (5 mg total) by mouth every evening. ?Patient taking differently: Take 5 mg by mouth daily as needed for allergies. 08/25/21  Yes White, Elita Boone, NP  ?levothyroxine (SYNTHROID) 112 MCG tablet TAKE 1 TABLET BY MOUTH EVERY DAY BEFORE BREAKFAST ?Patient taking differently: Take 112 mcg by mouth daily before breakfast. 07/19/21  Yes [name], DO  ?mometasone (NASONEX) 50 MCG/ACT nasal spray Place 2 sprays into the nose daily as needed (allergies).   Yes [provider]  ?nitroGLYCERIN (NITROSTAT) 0.4 MG SL tablet Place 1 tablet (0.4 mg total) under the tongue every 5 (five) minutes as needed for chest pain. 10/07/21  Yes Lyn Records, MD  ?Polyethyl Glycol-Propyl Glycol (SYSTANE ULTRA OP) Place 1 drop into the right eye daily as needed (dry eye).    Yes [provider]  ?silver sulfADIAZINE (SILVADENE) 1 % cream Apply 1 application. topically daily as needed (irritated areas under abdomen). 09/03/20  Yes [provider]  ?telmisartan-hydrochlorothiazide (MICARDIS HCT) 40-12.5 MG tablet TAKE 1/2 TABLET BY MOUTH EVERY DAY ?Patient taking differently: Take 0.5  tablets by mouth daily. 02/28/21  Yes Lyn Records, MD  ?triamcinolone ointment (KENALOG) 0.1 % Apply 1 application. topically daily as needed (rash). 09/03/20  Yes [provider]  ?amoxicillin-clavulanate (AUGMENTIN) 875-125 MG tablet Take 1 tablet by mouth every 12 (twelve) hours. ?Patient not taking: Reported on 10/17/2021 08/25/21   Valinda Hoar, NP  ? ? ?Physical Exam: ?Vitals:  ? 10/17/21 1155 10/17/21 1225 10/17/21 1255 10/17/21 1325  ?BP: (!) 144/56 138/80 (!) 130/58 (!) 130/58  ?Pulse: 67 65 63 68  ?Resp: 16 15 11 12   ?Temp:    97.8 ?F (36.6 ?C)  ?TempSrc:    Oral  ?SpO2:  100% 100% 100% 100%  ?Weight:      ?Height:      ? ?General:  Appears calm and comfortable and is in NAD ?Eyes:  PERRL, EOMI, normal lids, iris ?ENT:  grossly normal hearing, lips & tongue, mmm ?Neck:  no LAD, masses or thyromegaly ?Cardiovascular:  RRR, no m/r/g. No LE edema.  ?Respiratory:   CTA bilaterally with no wheezes/rales/rhonchi.  Normal respiratory effort. ?Abdomen:  soft, +epigastric TTP, ND, hypoactive BS ?Skin:  no rash or induration seen on limited exam ?Musculoskeletal:  grossly normal tone BUE/BLE, good ROM, no bony abnormality ?Psychiatric:  grossly normal mood and affect, speech fluent and appropriate, AOx3 ?Neurologic:  CN 2-12 grossly intact, moves all extremities in coordinated fashion ? ? ?Radiological Exams on Admission: ?Independently reviewed - see discussion in A/P where applicable ? ?CT HEAD WO CONTRAST ? ?Result Date: 10/17/2021 ?CLINICAL DATA:  Unwitnessed syncopal episode, head and neck trauma. EXAM: CT HEAD WITHOUT CONTRAST CT CERVICAL SPINE WITHOUT CONTRAST TECHNIQUE: Multidetector CT imaging of the head and cervical spine was performed following the standard protocol without intravenous contrast. Multiplanar CT image reconstructions of the cervical spine were also generated. RADIATION DOSE REDUCTION: This exam was performed according to the departmental dose-optimization program which includes  automated exposure control, adjustment of the mA and/or kV according to patient size and/or use of iterative reconstruction technique. COMPARISON:  None Available. FINDINGS: CT HEAD FINDINGS Brain: No acute int

## 2021-10-17 NOTE — Anesthesia Preprocedure Evaluation (Signed)
Anesthesia Evaluation  ?Patient identified by MRN, date of birth, ID band ?Patient awake ? ? ? ?Reviewed: ?Allergy & Precautions, NPO status , Patient's Chart, lab work & pertinent test results ? ?Airway ?Mallampati: II ? ?TM Distance: >3 FB ?Neck ROM: Full ? ? ? Dental ?no notable dental hx. ? ?  ?Pulmonary ?COPD, former smoker,  ?  ?Pulmonary exam normal ?breath sounds clear to auscultation ? ? ? ? ? ? Cardiovascular ?hypertension, Pt. on medications ?+ CAD, + Peripheral Vascular Disease and + DOE  ?Normal cardiovascular exam ?Rhythm:Regular Rate:Normal ? ? ?  ?Neuro/Psych ? Headaches, Anxiety Depression negative psych ROS  ? GI/Hepatic ?Neg liver ROS, PUD,   ?Endo/Other  ?Hypothyroidism  ? Renal/GU ?negative Renal ROS  ?negative genitourinary ?  ?Musculoskeletal ? ?(+) Arthritis , Osteoarthritis,   ? Abdominal ?(+) + obese,   ?Peds ?negative pediatric ROS ?(+)  Hematology ? ?(+) Blood dyscrasia, anemia ,   ?Anesthesia Other Findings ? ? Reproductive/Obstetrics ?negative OB ROS ? ?  ? ? ? ? ? ? ? ? ? ? ? ? ? ?  ?  ? ? ? ? ? ? ? ? ?Anesthesia Physical ?Anesthesia Plan ? ?ASA: 3 ? ?Anesthesia Plan: MAC  ? ?Post-op Pain Management: Minimal or no pain anticipated  ? ?Induction: Intravenous ? ?PONV Risk Score and Plan: 2 and Propofol infusion and Treatment may vary due to age or medical condition ? ?Airway Management Planned: Nasal Cannula ? ?Additional Equipment:  ? ?Intra-op Plan:  ? ?Post-operative Plan:  ? ?Informed Consent: I have reviewed the patients History and Physical, chart, labs and discussed the procedure including the risks, benefits and alternatives for the proposed anesthesia with the patient or authorized representative who has indicated his/her understanding and acceptance.  ? ? ? ?Dental advisory given ? ?Plan Discussed with: CRNA ? ?Anesthesia Plan Comments:   ? ? ? ? ? ? ?Anesthesia Quick Evaluation ? ?

## 2021-10-17 NOTE — Anesthesia Procedure Notes (Signed)
Procedure Name: [place] ?Date/Time: 10/17/2021 10:41 AM ?Performed by: Lieutenant Diego, CRNA ?Pre-anesthesia Checklist: Patient identified, Emergency Drugs available, Suction available, Timeout performed and Patient being monitored ?Patient Re-evaluated:Patient Re-evaluated prior to induction ?Oxygen Delivery Method: Simple face mask ?Preoxygenation: Pre-oxygenation with 100% oxygen ?Induction Type: IV induction ? ? ? ? ?

## 2021-10-17 NOTE — ED Notes (Signed)
Gone to endo ? ?

## 2021-10-17 NOTE — Transfer of Care (Signed)
Immediate Anesthesia Transfer of Care Note ? ?Patient: Cassandra Rios ? ?Procedure(s) Performed: ESOPHAGOGASTRODUODENOSCOPY (EGD) WITH PROPOFOL ? ?Patient Location: PACU ? ?Anesthesia Type:MAC ? ?Level of Consciousness: drowsy ? ?Airway & Oxygen Therapy: Patient Spontanous Breathing and Patient connected to face mask oxygen ? ?Post-op Assessment: Report given to RN and Post -op Vital signs reviewed and stable ? ?Post vital signs: Reviewed and stable ? ?Last Vitals:  ?Vitals Value Taken Time  ?BP    ?Temp    ?Pulse 77 10/17/21 1055  ?Resp 21 10/17/21 1055  ?SpO2 100 % 10/17/21 1055  ?Vitals shown include unvalidated device data. ? ?Last Pain:  ?Vitals:  ? 10/17/21 0936  ?TempSrc: Temporal  ?PainSc: 0-No pain  ?   ? ?  ? ?Complications: No notable events documented. ?

## 2021-10-18 ENCOUNTER — Encounter (HOSPITAL_COMMUNITY): Payer: Self-pay | Admitting: Gastroenterology

## 2021-10-18 ENCOUNTER — Inpatient Hospital Stay (HOSPITAL_COMMUNITY): Payer: PPO

## 2021-10-18 DIAGNOSIS — R55 Syncope and collapse: Secondary | ICD-10-CM

## 2021-10-18 DIAGNOSIS — K922 Gastrointestinal hemorrhage, unspecified: Secondary | ICD-10-CM | POA: Diagnosis not present

## 2021-10-18 DIAGNOSIS — I714 Abdominal aortic aneurysm, without rupture, unspecified: Secondary | ICD-10-CM | POA: Diagnosis not present

## 2021-10-18 DIAGNOSIS — I10 Essential (primary) hypertension: Secondary | ICD-10-CM | POA: Diagnosis not present

## 2021-10-18 DIAGNOSIS — E039 Hypothyroidism, unspecified: Secondary | ICD-10-CM

## 2021-10-18 LAB — CBC WITH DIFFERENTIAL/PLATELET
Abs Immature Granulocytes: 0.04 10*3/uL (ref 0.00–0.07)
Abs Immature Granulocytes: 0.04 10*3/uL (ref 0.00–0.07)
Basophils Absolute: 0 10*3/uL (ref 0.0–0.1)
Basophils Absolute: 0 10*3/uL (ref 0.0–0.1)
Basophils Relative: 0 %
Basophils Relative: 0 %
Eosinophils Absolute: 0.2 10*3/uL (ref 0.0–0.5)
Eosinophils Absolute: 0.2 10*3/uL (ref 0.0–0.5)
Eosinophils Relative: 2 %
Eosinophils Relative: 2 %
HCT: 27.5 % — ABNORMAL LOW (ref 36.0–46.0)
HCT: 27.2 % — ABNORMAL LOW (ref 36.0–46.0)
Hemoglobin: 8.8 g/dL — ABNORMAL LOW (ref 12.0–15.0)
Hemoglobin: 9 g/dL — ABNORMAL LOW (ref 12.0–15.0)
Immature Granulocytes: 0 %
Immature Granulocytes: 0 %
Lymphocytes Relative: 27 %
Lymphocytes Relative: 30 %
Lymphs Abs: 2.9 10*3/uL (ref 0.7–4.0)
Lymphs Abs: 2.8 10*3/uL (ref 0.7–4.0)
MCH: 29.6 pg (ref 26.0–34.0)
MCH: 30.9 pg (ref 26.0–34.0)
MCHC: 32 g/dL (ref 30.0–36.0)
MCHC: 33.1 g/dL (ref 30.0–36.0)
MCV: 92.6 fL (ref 80.0–100.0)
MCV: 93.5 fL (ref 80.0–100.0)
Monocytes Absolute: 1.1 10*3/uL — ABNORMAL HIGH (ref 0.1–1.0)
Monocytes Absolute: 0.7 10*3/uL (ref 0.1–1.0)
Monocytes Relative: 10 %
Monocytes Relative: 7 %
Neutro Abs: 6.5 10*3/uL (ref 1.7–7.7)
Neutro Abs: 5.7 10*3/uL (ref 1.7–7.7)
Neutrophils Relative %: 61 %
Neutrophils Relative %: 61 %
Platelets: 134 10*3/uL — ABNORMAL LOW (ref 150–400)
Platelets: 129 10*3/uL — ABNORMAL LOW (ref 150–400)
RBC: 2.97 MIL/uL — ABNORMAL LOW (ref 3.87–5.11)
RBC: 2.91 MIL/uL — ABNORMAL LOW (ref 3.87–5.11)
RDW: 14.3 % (ref 11.5–15.5)
RDW: 14 % (ref 11.5–15.5)
WBC: 10.7 10*3/uL — ABNORMAL HIGH (ref 4.0–10.5)
WBC: 9.4 10*3/uL (ref 4.0–10.5)
nRBC: 0 % (ref 0.0–0.2)
nRBC: 0 % (ref 0.0–0.2)

## 2021-10-18 LAB — BASIC METABOLIC PANEL
Anion gap: 6 (ref 5–15)
BUN: 32 mg/dL — ABNORMAL HIGH (ref 8–23)
CO2: 22 mmol/L (ref 22–32)
Calcium: 8.3 mg/dL — ABNORMAL LOW (ref 8.9–10.3)
Chloride: 108 mmol/L (ref 98–111)
Creatinine, Ser: 0.95 mg/dL (ref 0.44–1.00)
GFR, Estimated: >60 mL/min (ref >60)
Glucose, Bld: 98 mg/dL (ref 70–99)
Potassium: 4.2 mmol/L (ref 3.5–5.1)
Sodium: 136 mmol/L (ref 135–145)

## 2021-10-18 LAB — ECHOCARDIOGRAM COMPLETE
Area-P 1/2: 4.49 cm2
Calc EF: 64.4 %
Height: 66 in
S' Lateral: 2.6 cm
Single Plane A2C EF: 62.7 %
Single Plane A4C EF: 67 %
Weight: 3760 oz

## 2021-10-18 LAB — H. PYLORI ANTIBODY, IGG: H Pylori IgG: 0.08 Index Value (ref 0.00–0.79)

## 2021-10-18 MED ORDER — SODIUM CHLORIDE 0.9 % IV SOLN: INTRAVENOUS | Status: DC

## 2021-10-18 MED ORDER — PANTOPRAZOLE SODIUM 40 MG PO TBEC
40.0000 mg | DELAYED_RELEASE_TABLET | Freq: Two times a day (BID) | ORAL | Status: DC
  Administered 2021-10-18 – 2021-10-21 (×6): 40 mg via ORAL
  Filled 2021-10-18 (×6): qty 1

## 2021-10-18 NOTE — Evaluation (Signed)
Physical Therapy Evaluation ?Patient Details ?Name: Cassandra Rios ?MRN: [id number] ?DOB: [birth year] ?Today's Date: 10/18/2021 ? ?History of Present Illness ? Pt is a 71 y/o female admitted secondary to syncope and upper GI bleed. Pt is s/p endoscopy that showed gastric ulcers. PMH includes MS, HTN, AAA, COPD, and CAD.  ?Clinical Impression ? Pt admitted secondary to problem above with deficits below. Pt requiring min guard A for mobility tasks using RW. Pt with + orthostatics and increased shakiness in standing, so further mobility deferred. Anticipate pt will progress well as BP improves. Recommending HHPT at d/c to address current deficits. Will continue to follow acutely.  ? ? 10/18/21 1413  ?Orthostatic Lying   ?BP- Lying 93/47  ?Orthostatic Sitting  ?BP- Sitting 102/63  ?Orthostatic Standing at 0 minutes  ?BP- Standing at 0 minutes (!) 78/36  ?   ?   ? ?Recommendations for follow up therapy are one component of a multi-disciplinary discharge planning process, led by the attending physician.  Recommendations may be updated based on patient status, additional functional criteria and insurance authorization. ? ?Follow Up Recommendations Home health PT ? ?  ?Assistance Recommended at Discharge Intermittent Supervision/Assistance  ?Patient can return home with the following ? Help with stairs or ramp for entrance;Assist for transportation;Assistance with cooking/housework ? ?  ?Equipment Recommendations None recommended by PT  ?Recommendations for Other Services ?    ?  ?Functional Status Assessment Patient has had a recent decline in their functional status and demonstrates the ability to make significant improvements in function in a reasonable and predictable amount of time.  ? ?  ?Precautions / Restrictions Precautions ?Precautions: Fall ?Restrictions ?Weight Bearing Restrictions: No  ? ?  ? ?Mobility ? Bed Mobility ?Overal bed mobility: Needs Assistance ?Bed Mobility: Supine to Sit, Sit to Supine ?  ?   ?Supine to sit: Min guard ?Sit to supine: Min guard ?  ?General bed mobility comments: Min guard for safety. ?  ? ?Transfers ?Overall transfer level: Needs assistance ?Equipment used: Rolling walker (2 wheels) ?Transfers: Sit to/from Stand ?Sit to Stand: Min guard ?  ?  ?  ?  ?  ?General transfer comment: Min guard for safety. Pt with increased shakiness and + orthostatics so further mobility deferred. ?  ? ?Ambulation/Gait ?  ?  ?  ?  ?  ?  ?  ?  ? ?Stairs ?  ?  ?  ?  ?  ? ?Wheelchair Mobility ?  ? ?Modified Rankin (Stroke Patients Only) ?  ? ?  ? ?Balance Overall balance assessment: Needs assistance ?Sitting-balance support: No upper extremity supported ?Sitting balance-Leahy Scale: Good ?  ?  ?Standing balance support: Bilateral upper extremity supported ?Standing balance-Leahy Scale: Poor ?Standing balance comment: Reliant on UE support ?  ?  ?  ?  ?  ?  ?  ?  ?  ?  ?  ?   ? ? ? ?Pertinent Vitals/Pain Pain Assessment ?Pain Assessment: Faces ?Faces Pain Scale: Hurts little more ?Pain Location: headache and back ?Pain Descriptors / Indicators: Headache, Guarding, Grimacing ?Pain Intervention(s): Limited activity within patient's tolerance, Monitored during session, Repositioned  ? ? ?Home Living Family/patient expects to be discharged to:: Private residence ?Living Arrangements: Spouse/significant other ?Available Help at Discharge: Family;Available 24 hours/day ?Type of Home: House ?Home Access: Stairs to enter ?Entrance Stairs-Rails: None ?Entrance Stairs-Number of Steps: 1 ?  ?Home Layout: One level ?Home Equipment: Agricultural consultant (2 wheels) ?   ?  ?Prior Function Prior Level  of Function : Independent/Modified Independent ?  ?  ?  ?  ?  ?  ?Mobility Comments: Uses RW for ambulation ?  ?  ? ? ?Hand Dominance  ?   ? ?  ?Extremity/Trunk Assessment  ? Upper Extremity Assessment ?Upper Extremity Assessment: Defer to OT evaluation ?  ? ?Lower Extremity Assessment ?Lower Extremity Assessment: Generalized weakness ?   ? ?Cervical / Trunk Assessment ?Cervical / Trunk Assessment: Normal  ?Communication  ? Communication: No difficulties  ?Cognition Arousal/Alertness: Awake/alert ?Behavior During Therapy: [facility] for tasks assessed/performed ?Overall Cognitive Status: Within Functional Limits for tasks assessed ?  ?  ?  ?  ?  ?  ?  ?  ?  ?  ?  ?  ?  ?  ?  ?  ?  ?  ?  ? ?  ?General Comments   ? ?  ?Exercises    ? ?Assessment/Plan  ?  ?PT Assessment Patient needs continued PT services  ?PT Problem List Decreased strength;Cardiopulmonary status limiting activity;Decreased balance;Decreased activity tolerance;Decreased mobility;Pain ? ?   ?  ?PT Treatment Interventions DME instruction;Gait training;Stair training;Functional mobility training;Therapeutic activities;Therapeutic exercise;Balance training;Patient/family education   ? ?PT Goals (Current goals can be found in the Care Plan section)  ?Acute Rehab PT Goals ?Patient Stated Goal: to go home ?PT Goal Formulation: With patient ?Time For Goal Achievement: 11/01/21 ?Potential to Achieve Goals: Good ? ?  ?Frequency Min 3X/week ?  ? ? ?Co-evaluation   ?  ?  ?  ?  ? ? ?  ?AM-PAC PT "6 Clicks" Mobility  ?Outcome Measure Help needed turning from your back to your side while in a flat bed without using bedrails?: A Little ?Help needed moving from lying on your back to sitting on the side of a flat bed without using bedrails?: A Little ?Help needed moving to and from a bed to a chair (including a wheelchair)?: A Little ?Help needed standing up from a chair using your arms (e.g., wheelchair or bedside chair)?: A Little ?Help needed to walk in hospital room?: A Little ?Help needed climbing 3-5 steps with a railing? : A Lot ?6 Click Score: 17 ? ?  ?End of Session Equipment Utilized During Treatment: Gait belt ?Activity Tolerance: Treatment limited secondary to medical complications (Comment) (+ orthostatics) ?Patient left: in bed;with call bell/phone within reach ?Nurse Communication: Mobility  status ?PT Visit Diagnosis: Unsteadiness on feet (R26.81);Muscle weakness (generalized) (M62.81) ?  ? ?Time: 1610-9604 ?PT Time Calculation (min) (ACUTE ONLY): 26 min ? ? ?Charges:   PT Evaluation ?$PT Eval Moderate Complexity: 1 Mod ?PT Treatments ?$Therapeutic Activity: 8-22 mins ?  ?   ? ? ?Farley Ly, PT, DPT  ?Acute Rehabilitation Services  ?Pager: [phone] ?Office: [phone] ? ? ?[place] S Kesler Wickham ?10/18/2021, 4:43 PM ? ?

## 2021-10-18 NOTE — Progress Notes (Signed)
?  Echocardiogram ?2D Echocardiogram has been performed. ? ?Fidel Levy ?10/18/2021, 1:23 PM ?

## 2021-10-18 NOTE — Progress Notes (Signed)
Subjective: ?Some abdominal soreness (epigastric). ?No hematemesis or recurrent blood in stool. ? ?Objective: ?Vital signs in last 24 hours: ?Temp:  [97.5 ?F (36.4 ?C)-98 ?F (36.7 ?C)] 97.5 ?F (36.4 ?C) (05/16 0831) ?Pulse Rate:  [61-77] 61 (05/16 0831) ?Resp:  [11-21] 13 (05/16 0831) ?BP: (98-144)/(48-101) 117/55 (05/16 0831) ?SpO2:  [93 %-100 %] 100 % (05/16 0831) ?Weight:  [106.6 kg] 106.6 kg (05/15 0936) ?Weight change: 0 kg ?Last BM Date : 10/16/21 ? ?PE: ?GEN:  NAD ?ABD:  Soft, mild periumbilical and epigastric tenderness without peritonitis ?NEURO:  A/O, no encephalopathy ? ?Lab Results: ?CBC ?   ?Component Value Date/Time  ? WBC 10.7 (H) 10/18/2021 0535  ? RBC 2.97 (L) 10/18/2021 0535  ? HGB 8.8 (L) 10/18/2021 0535  ? HGB 14.7 05/19/2020 0936  ? HGB 14.3 07/07/2010 1530  ? HCT 27.5 (L) 10/18/2021 0535  ? HCT 43.9 05/19/2020 0936  ? HCT 42.1 07/07/2010 1530  ? PLT 134 (L) 10/18/2021 0535  ? PLT 123 (L) 05/19/2020 0936  ? MCV 92.6 10/18/2021 0535  ? MCV 89 05/19/2020 0936  ? MCV 89 07/07/2010 1530  ? MCH 29.6 10/18/2021 0535  ? MCHC 32.0 10/18/2021 0535  ? RDW 14.3 10/18/2021 0535  ? RDW 12.4 05/19/2020 0936  ? RDW 11.4 07/07/2010 1530  ? LYMPHSABS 2.9 10/18/2021 0535  ? LYMPHSABS 2.4 05/19/2020 0936  ? LYMPHSABS 2.6 07/07/2010 1530  ? MONOABS 1.1 (H) 10/18/2021 0535  ? EOSABS 0.2 10/18/2021 0535  ? EOSABS 0.2 05/19/2020 0936  ? EOSABS 0.2 07/07/2010 1530  ? BASOSABS 0.0 10/18/2021 0535  ? BASOSABS 0.0 05/19/2020 0936  ? BASOSABS 0.0 07/07/2010 1530  ?CMP  ?   ?Component Value Date/Time  ? NA 136 10/18/2021 0535  ? NA 138 07/03/2019 1515  ? K 4.2 10/18/2021 0535  ? CL 108 10/18/2021 0535  ? CO2 22 10/18/2021 0535  ? GLUCOSE 98 10/18/2021 0535  ? BUN 32 (H) 10/18/2021 0535  ? BUN 12 07/03/2019 1515  ? CREATININE 0.95 10/18/2021 0535  ? CREATININE 1.04 05/13/2014 0840  ? CALCIUM 8.3 (L) 10/18/2021 0535  ? PROT 5.8 (L) 10/17/2021 0404  ? PROT 6.2 08/26/2019 0731  ? ALBUMIN 2.9 (L) 10/17/2021 0404  ? ALBUMIN 3.9  08/26/2019 0731  ? AST 26 10/17/2021 0404  ? ALT 22 10/17/2021 0404  ? ALKPHOS 60 10/17/2021 0404  ? BILITOT 0.9 10/17/2021 0404  ? BILITOT 0.3 08/26/2019 0731  ? GFRNONAA >60 10/18/2021 0535  ? GFRNONAA 57 (L) 05/13/2014 0840  ? GFRAA 67 07/03/2019 1515  ? GFRAA 66 05/13/2014 0840  ? ?Assessment: ? ? Acute blood loss anemia. ?Melena, hematemesis, resolved. ?Gastric ulcer, due to #1 and #2 above. ? ?Plan: ? ?Oral PPI (pantoprazole 40 mg po bid, or the equivalent) x 6 weeks, then 40 mg po qd thereafter indefinitely. ?Baby ASA ok, but no other NSAIDs. ?Awaiting H. Pylori serologies. ?Slowly advance diet as tolerated. ?OOBTC/mobilize as tolerated. ?Hope for discharge home tomorrow. ?[facility] will follow. ? ? Landry Dyke ?10/18/2021, 9:32 AM ? ? ?Cell [phone] ?If no answer or after 5 PM call [phone] ? ?

## 2021-10-18 NOTE — Progress Notes (Signed)
?      ?                 PROGRESS NOTE ? ?      ?PATIENT DETAILS ?Name: Cassandra Rios ?Age: 71 y.o. ?Sex: female ?Date of Birth: [birth year] ?Admit Date: 10/17/2021 ?Admitting Physician Carollee Herter, DO ?ZOX:[id number] Irish Elders, DO ? ?Brief Summary: ?Patient is a 71 y.o.  female with history of HTN, HLD, hypothyroidism, multiple sclerosis, COPD, AAA-presented with upper GI bleeding with acute blood loss anemia and episode of syncope.  See below for further details. ? ? ?Significant events: ?5/15>> admitted with syncope/melanotic stools-upper GI bleeding with acute blood loss anemia ? ?Significant studies: ?5/14>> CXR: No PNA ?5/14>> CT head: No acute intracranial abnormality. ?5/14>> CT C-spine: No fracture-multilevel degenerative changes ? ? ?Significant microbiology data: ? ? ?Procedures: ?5/15>> EGD: Non bleeding gastric ulcers with pigmented material. ? ?Consults: ?GI ? ?Subjective: ?Lying comfortably in bed-denies any chest pain or shortness of breath.  No melanotic stools-no BM since hospitalization.  No hematemesis. ? ?Objective: ?Vitals: ?Blood pressure (!) 117/55, pulse 61, temperature (!) 97.5 ?F (36.4 ?C), temperature source Oral, resp. rate 13, height 5\' 6"  (1.676 m), weight 106.6 kg, SpO2 100 %.  ? ?Exam: ?Gen Exam:Alert awake-not in any distress ?HEENT:atraumatic, normocephalic ?Chest: B/L clear to auscultation anteriorly ?CVS:S1S2 regular ?Abdomen:soft non tender, non distended ?Extremities:no edema ?Neurology: Non focal ?Skin: no rash ? ?Pertinent Labs/Radiology: ? ?  Latest Ref Rng & Units 10/18/2021  ?  5:35 AM 10/17/2021  ?  4:34 PM 10/17/2021  ?  4:20 AM  ?CBC  ?WBC 4.0 - 10.5 K/uL 10.7   8.5     ?Hemoglobin 12.0 - 15.0 g/dL 8.8   9.0   54.0    ?Hematocrit 36.0 - 46.0 % 27.5   26.9   33.0    ?Platelets 150 - 400 K/uL 134   156     ?  ?Lab Results  ?Component Value Date  ? NA 136 10/18/2021  ? K 4.2 10/18/2021  ? CL 108 10/18/2021  ? CO2 22 10/18/2021  ?  ? ? ?Assessment/Plan: ?Upper GI bleeding  with acute blood loss anemia due to gastric ulcers: No further bleeding-hemoglobin relatively stable-on PPI-diet being advanced-awaiting H. pylori studies.  Mobilize today-if she remains stable-likely discharge home tomorrow.  Continue to follow CBC. ? ?Syncope: Occurred 1 day prior to this hospitalization-apparently occurred when she was in the bathroom-suspect either vasovagal syncope or orthostatic mechanism.  Telemetry negative.  Check orthostatic vital signs today-mobilize-have ordered an echocardiogram as well. ? ?HTN: BP stable without the use of any antihypertensives-resume when able. ? ?HLD: Continue Lipitor. ? ?Hypothyroidism: Continue Synthroid. ? ?Mood disorder: Continue fluoxetine ? ?AAA: Continue outpatient follow-up with vascular surgery ? ?Multiple sclerosis: Should be okay to resume interferon beta-1 B at the time of discharge. ? ?Obesity: ?Estimated body mass index is 37.93 kg/m? as calculated from the following: ?  Height as of this encounter: 5\' 6"  (1.676 m). ?  Weight as of this encounter: 106.6 kg.  ? ?Code status: ?  Code Status: DNR  ? ?DVT Prophylaxis: ?SCDs Start: 10/17/21 1401 ?  ?Family Communication: None at bedside ? ? ?Disposition Plan: ?Status is: Inpatient ?Remains inpatient appropriate because: UGI bleeding with ABLA-diet being advanced-monitoring CBC-likely needs 1 more day of hospitalization.  Possible discharge on 5/17. ?  ?Planned Discharge Destination:Home ? ? ?Diet: ?Diet Order   ? ?       ?  Diet clear  liquid Room service appropriate? Yes with Assist; Fluid consistency: Thin  Diet effective now       ?  ? ?  ?  ? ?  ?  ? ? ?Antimicrobial agents: ?Anti-infectives (From admission, onward)  ? ? None  ? ?  ? ? ? ?MEDICATIONS: ?Scheduled Meds: ? atorvastatin  80 mg Oral Daily  ? FLUoxetine  40 mg Oral Daily  ? gabapentin  600 mg Oral QPM  ? levothyroxine  112 mcg Oral QAC breakfast  ? pantoprazole  40 mg Oral BID AC  ? ?Continuous Infusions: ? lactated ringers 100 mL/hr at  10/18/21 3329  ? ?PRN Meds:.acetaminophen **OR** acetaminophen, hydrALAZINE, ondansetron **OR** ondansetron (ZOFRAN) IV, polyvinyl alcohol ? ? ?I have personally reviewed following labs and imaging studies ? ?LABORATORY DATA: ?CBC: ?Recent Labs  ?Lab 10/17/21 ?0404 10/17/21 ?0420 10/17/21 ?1634 10/18/21 ?5188  ?WBC 12.2*  --  8.5 10.7*  ?NEUTROABS 9.4*  --  5.8 6.5  ?HGB 11.1* 11.2* 9.0* 8.8*  ?HCT 32.7* 33.0* 26.9* 27.5*  ?MCV 90.8  --  91.2 92.6  ?PLT 227  --  156 134*  ? ? ?Basic Metabolic Panel: ?Recent Labs  ?Lab 10/17/21 ?0404 10/17/21 ?0420 10/18/21 ?4166  ?NA 134* 135 136  ?K 3.9 3.9 4.2  ?CL 105 103 108  ?CO2 21*  --  22  ?GLUCOSE 137* 130* 98  ?BUN 37* 36* 32*  ?CREATININE 1.00 0.90 0.95  ?CALCIUM 8.8*  --  8.3*  ? ? ?GFR: ?Estimated Creatinine Clearance: 67.1 mL/min (by C-G formula based on SCr of 0.95 mg/dL). ? ?Liver Function Tests: ?Recent Labs  ?Lab 10/17/21 ?0404  ?AST 26  ?ALT 22  ?ALKPHOS 60  ?BILITOT 0.9  ?PROT 5.8*  ?ALBUMIN 2.9*  ? ?No results for input(s): LIPASE, AMYLASE in the last 168 hours. ?No results for input(s): AMMONIA in the last 168 hours. ? ?Coagulation Profile: ?Recent Labs  ?Lab 10/17/21 ?0404  ?INR 1.1  ? ? ?Cardiac Enzymes: ?No results for input(s): CKTOTAL, CKMB, CKMBINDEX, TROPONINI in the last 168 hours. ? ?BNP (last 3 results) ?No results for input(s): PROBNP in the last 8760 hours. ? ?Lipid Profile: ?No results for input(s): CHOL, HDL, LDLCALC, TRIG, CHOLHDL, LDLDIRECT in the last 72 hours. ? ?Thyroid Function Tests: ?No results for input(s): TSH, T4TOTAL, FREET4, T3FREE, THYROIDAB in the last 72 hours. ? ?Anemia Panel: ?No results for input(s): VITAMINB12, FOLATE, FERRITIN, TIBC, IRON, RETICCTPCT in the last 72 hours. ? ?Urine analysis: ?   ?Component Value Date/Time  ? COLORURINE yellow 01/17/2010 0753  ? APPEARANCEUR Clear 01/17/2010 0753  ? LABSPEC <1.005 01/17/2010 0753  ? PHURINE 6.5 01/17/2010 0753  ? HGBUR negative 01/17/2010 0753  ? BILIRUBINUR negative 02/03/2020  0945  ? PROTEINUR Negative 02/03/2020 0945  ? UROBILINOGEN 0.2 02/03/2020 0945  ? UROBILINOGEN 0.2 01/17/2010 0753  ? NITRITE negative 02/03/2020 0945  ? NITRITE negative 01/17/2010 0753  ? LEUKOCYTESUR Negative 02/03/2020 0945  ? ? ?Sepsis Labs: ?Lactic Acid, Venous ?No results found for: LATICACIDVEN ? ?MICROBIOLOGY: ?No results found for this or any previous visit (from the past 240 hour(s)). ? ?RADIOLOGY STUDIES/RESULTS: ?CT HEAD WO CONTRAST ? ?Result Date: 10/17/2021 ?CLINICAL DATA:  Unwitnessed syncopal episode, head and neck trauma. EXAM: CT HEAD WITHOUT CONTRAST CT CERVICAL SPINE WITHOUT CONTRAST TECHNIQUE: Multidetector CT imaging of the head and cervical spine was performed following the standard protocol without intravenous contrast. Multiplanar CT image reconstructions of the cervical spine were also generated. RADIATION DOSE REDUCTION: This exam was performed  according to the departmental dose-optimization program which includes automated exposure control, adjustment of the mA and/or kV according to patient size and/or use of iterative reconstruction technique. COMPARISON:  None Available. FINDINGS: CT HEAD FINDINGS Brain: No acute intracranial hemorrhage, midline shift or mass effect. No extra-axial fluid collection. Diffuse atrophy is noted. Periventricular white matter hypodensities are present bilaterally. No hydrocephalus. Vascular: No hyperdense vessel or unexpected calcification. Skull: Normal. Negative for fracture or focal lesion. Sinuses/Orbits: Mild mucosal thickening in the right maxillary, sphenoid, and frontal sinuses. No acute orbital abnormality. Other: None. CT CERVICAL SPINE FINDINGS Alignment: Normal. Skull base and vertebrae: No acute fracture. No primary bone lesion or focal pathologic process. Soft tissues and spinal canal: No prevertebral fluid or swelling. No visible canal hematoma. Disc levels: Intervertebral disc space narrowing, endplate osteophyte formation, facet arthropathy  is noted. Upper chest: Atherosclerotic calcification of the aorta. Other: Atherosclerotic calcification of the carotid and vertebral arteries. IMPRESSION: 1. No acute intracranial hemorrhage. 2. Atrophy with c

## 2021-10-18 NOTE — Care Management (Signed)
?  Transition of Care (TOC) Screening Note ? ? ?Patient Details  ?Name: Cassandra Rios ?Date of Birth: [birth year] ? ? ?Transition of Care (TOC) CM/SW Contact:    ?Lawerance Sabal, RN ?Phone Number: ?10/18/2021, 7:54 AM ? ? ? ?Transition of Care Department [facility]) has reviewed patient and no TOC needs have been identified at this time. We will continue to monitor patient advancement through interdisciplinary progression rounds. If new patient transition needs arise, please place a TOC consult. ?  ?

## 2021-10-19 DIAGNOSIS — E039 Hypothyroidism, unspecified: Secondary | ICD-10-CM | POA: Diagnosis not present

## 2021-10-19 DIAGNOSIS — K922 Gastrointestinal hemorrhage, unspecified: Secondary | ICD-10-CM | POA: Diagnosis not present

## 2021-10-19 DIAGNOSIS — I714 Abdominal aortic aneurysm, without rupture, unspecified: Secondary | ICD-10-CM | POA: Diagnosis not present

## 2021-10-19 DIAGNOSIS — I10 Essential (primary) hypertension: Secondary | ICD-10-CM | POA: Diagnosis not present

## 2021-10-19 LAB — CBC WITH DIFFERENTIAL/PLATELET
Abs Immature Granulocytes: 0.02 K/uL (ref 0.00–0.07)
Basophils Absolute: 0 K/uL (ref 0.0–0.1)
Basophils Relative: 0 %
Eosinophils Absolute: 0.2 K/uL (ref 0.0–0.5)
Eosinophils Relative: 3 %
HCT: 24.4 % — ABNORMAL LOW (ref 36.0–46.0)
Hemoglobin: 8 g/dL — ABNORMAL LOW (ref 12.0–15.0)
Immature Granulocytes: 0 %
Lymphocytes Relative: 22 %
Lymphs Abs: 1.7 K/uL (ref 0.7–4.0)
MCH: 30.3 pg (ref 26.0–34.0)
MCHC: 32.8 g/dL (ref 30.0–36.0)
MCV: 92.4 fL (ref 80.0–100.0)
Monocytes Absolute: 0.5 K/uL (ref 0.1–1.0)
Monocytes Relative: 6 %
Neutro Abs: 5.3 K/uL (ref 1.7–7.7)
Neutrophils Relative %: 69 %
Platelets: 110 K/uL — ABNORMAL LOW (ref 150–400)
RBC: 2.64 MIL/uL — ABNORMAL LOW (ref 3.87–5.11)
RDW: 13.8 % (ref 11.5–15.5)
WBC: 7.7 K/uL (ref 4.0–10.5)
nRBC: 0 % (ref 0.0–0.2)

## 2021-10-19 MED ORDER — SODIUM CHLORIDE 0.9 % IV SOLN: INTRAVENOUS | Status: DC

## 2021-10-19 MED ORDER — INTERFERON BETA-1B 0.3 MG ~~LOC~~ KIT: 0.2500 mg | PACK | SUBCUTANEOUS | Status: DC

## 2021-10-19 NOTE — Progress Notes (Signed)
Subjective: ?Large melena this morning.  No further bleeding.  No hematemesis. ?Abdominal pain improving. ? ?Objective: ?Vital signs in last 24 hours: ?Temp:  [97.6 ?F (36.4 ?C)-98 ?F (36.7 ?C)] 97.6 ?F (36.4 ?C) (05/17 1129) ?Pulse Rate:  [59-73] 73 (05/17 1129) ?Resp:  [13-18] 18 (05/17 1129) ?BP: (94-143)/(40-69) 97/60 (05/17 1129) ?SpO2:  [91 %-99 %] 91 % (05/17 1129) ?Weight change:  ?Last BM Date : 10/19/21 ? ?PE: ?GEN:  NAD ?HEENT:  Pale, [place]/AT, anicteric ?NEURO:  A/O, no encephalopathy ? ?Lab Results: ?CBC ?   ?Component Value Date/Time  ? WBC 7.7 10/19/2021 0533  ? RBC 2.64 (L) 10/19/2021 0533  ? HGB 8.0 (L) 10/19/2021 0533  ? HGB 14.7 05/19/2020 0936  ? HGB 14.3 07/07/2010 1530  ? HCT 24.4 (L) 10/19/2021 0533  ? HCT 43.9 05/19/2020 0936  ? HCT 42.1 07/07/2010 1530  ? PLT 110 (L) 10/19/2021 0533  ? PLT 123 (L) 05/19/2020 0936  ? MCV 92.4 10/19/2021 0533  ? MCV 89 05/19/2020 0936  ? MCV 89 07/07/2010 1530  ? MCH 30.3 10/19/2021 0533  ? MCHC 32.8 10/19/2021 0533  ? RDW 13.8 10/19/2021 0533  ? RDW 12.4 05/19/2020 0936  ? RDW 11.4 07/07/2010 1530  ? LYMPHSABS 1.7 10/19/2021 0533  ? LYMPHSABS 2.4 05/19/2020 0936  ? LYMPHSABS 2.6 07/07/2010 1530  ? MONOABS 0.5 10/19/2021 0533  ? EOSABS 0.2 10/19/2021 0533  ? EOSABS 0.2 05/19/2020 0936  ? EOSABS 0.2 07/07/2010 1530  ? BASOSABS 0.0 10/19/2021 0533  ? BASOSABS 0.0 05/19/2020 0936  ? BASOSABS 0.0 07/07/2010 1530  ?CMP  ?   ?Component Value Date/Time  ? NA 136 10/18/2021 0535  ? NA 138 07/03/2019 1515  ? K 4.2 10/18/2021 0535  ? CL 108 10/18/2021 0535  ? CO2 22 10/18/2021 0535  ? GLUCOSE 98 10/18/2021 0535  ? BUN 32 (H) 10/18/2021 0535  ? BUN 12 07/03/2019 1515  ? CREATININE 0.95 10/18/2021 0535  ? CREATININE 1.04 05/13/2014 0840  ? CALCIUM 8.3 (L) 10/18/2021 0535  ? PROT 5.8 (L) 10/17/2021 0404  ? PROT 6.2 08/26/2019 0731  ? ALBUMIN 2.9 (L) 10/17/2021 0404  ? ALBUMIN 3.9 08/26/2019 0731  ? AST 26 10/17/2021 0404  ? ALT 22 10/17/2021 0404  ? ALKPHOS 60 10/17/2021 0404   ? BILITOT 0.9 10/17/2021 0404  ? BILITOT 0.3 08/26/2019 0731  ? GFRNONAA >60 10/18/2021 0535  ? GFRNONAA 57 (L) 05/13/2014 0840  ? GFRAA 67 07/03/2019 1515  ? GFRAA 66 05/13/2014 0840  ? ?Assessment: ? ? Acute blood loss anemia. ?Melena, hematemesis.  Suspect melena this morning is old blood. ?Gastric ulcer, due to #1 and #2 above.  Likely NSAIDs; H pylori negative. ? ?Plan: ? ? Serial CBCs, IVF, transfuse if needed ? PPI. ?Soft diet. ?Hopefully home tomorrow. ? ? Landry Dyke ?10/19/2021, 1:08 PM ? ? ?Cell [phone] ?If no answer or after 5 PM call [phone] ? ?

## 2021-10-19 NOTE — Progress Notes (Addendum)
?      ?                 PROGRESS NOTE ? ?      ?PATIENT DETAILS ?Name: Cassandra Rios ?Age: 71 y.o. ?Sex: female ?Date of Birth: Oct 28, 1950 ?Admit Date: 10/17/2021 ?Admitting Physician Kristopher Oppenheim, DO ?QMV:HQION Koren Shiver, DO ? ?Brief Summary: ?Patient is a 71 y.o.  female with history of HTN, HLD, hypothyroidism, multiple sclerosis, COPD, AAA-presented with upper GI bleeding with acute blood loss anemia and episode of syncope.  See below for further details. ? ? ?Significant events: ?5/15>> admitted with syncope/melanotic stools-upper GI bleeding with acute blood loss anemia ? ?Significant studies: ?5/14>> CXR: No PNA ?5/14>> CT head: No acute intracranial abnormality. ?5/14>> CT C-spine: No fracture-multilevel degenerative changes ? ? ?Significant microbiology data: ? ? ?Procedures: ?5/15>> EGD: Non bleeding gastric ulcers with pigmented material. ? ?Consults: ?GI ? ?Subjective: ?No BM since admission-no hematemesis.  Symptomatic/dizzy when she stands up-orthostatic vital signs continue to be positive. ? ?Objective: ?Vitals: ?Blood pressure (!) 143/69, pulse 64, temperature 98 ?F (36.7 ?C), temperature source Oral, resp. rate 13, height '5\' 6"'$  (1.676 m), weight 106.6 kg, SpO2 94 %.  ? ?Exam: ?Gen Exam:Alert awake-not in any distress ?HEENT:atraumatic, normocephalic ?Chest: B/L clear to auscultation anteriorly ?CVS:S1S2 regular ?Abdomen:soft non tender, non distended ?Extremities:no edema ?Neurology: Non focal ?Skin: no rash  ? ?Pertinent Labs/Radiology: ? ?  Latest Ref Rng & Units 10/19/2021  ?  5:33 AM 10/18/2021  ?  5:03 PM 10/18/2021  ?  5:35 AM  ?CBC  ?WBC 4.0 - 10.5 K/uL 7.7   9.4   10.7    ?Hemoglobin 12.0 - 15.0 g/dL 8.0   9.0   8.8    ?Hematocrit 36.0 - 46.0 % 24.4   27.2   27.5    ?Platelets 150 - 400 K/uL 110   129   134    ?  ?Lab Results  ?Component Value Date  ? NA 136 10/18/2021  ? K 4.2 10/18/2021  ? CL 108 10/18/2021  ? CO2 22 10/18/2021  ? ?  ? ? ?Assessment/Plan: ?Upper GI bleeding with acute  blood loss anemia due to gastric ulcers: GI bleeding seems to have resolved-remains on PPI-slight drop in hemoglobin is likely due to equilibration and IV fluids that was started yesterday for orthostatic hypotension.  She has had no BM or vomiting since hospitalization.  Awaiting H. pylori antibodies-continue to mobilize with therapy.   ? ?Syncope: Likely orthostatic mechanism-telemetry negative-Echo with stable EF.  ? ?Orthostatic hypotension: Remains symptomatic per physical therapy-we will continue gentle hydration-hold all antihypertensives-and place thigh-high TED stockings.  Reassess on 5/18. ? ?HTN: BP slowly creeping up-in the 629B systolic-this morning-allow some amount of permissive hypertension due to ongoing orthostatic vital signs.  Continue to hold all antihypertensives.   ? ?HLD: Continue Lipitor. ? ?Hypothyroidism: Continue Synthroid. ? ?Mood disorder: Continue fluoxetine ? ?AAA: Continue outpatient follow-up with vascular surgery ? ?Multiple sclerosis: Should be okay to resume interferon beta-1 B at the time of discharge. ? ?Obesity: ?Estimated body mass index is 37.93 kg/m? as calculated from the following: ?  Height as of this encounter: '5\' 6"'$  (1.676 m). ?  Weight as of this encounter: 106.6 kg.  ? ?Code status: ?  Code Status: DNR  ? ?DVT Prophylaxis: ?Place TED hose Start: 10/19/21 0929 ?SCDs Start: 10/17/21 1401 ?  ?Family Communication: None at bedside ? ? ?Disposition Plan: ?Status is: Inpatient ?Remains inpatient appropriate because:  Patient Name:   Cassandra Rios Date of Exam: 10/18/2021 Medical Rec #:  [id number]          Height:       66.0 in Accession #:    [id number]         Weight:       235.0 lb Date of Birth:  Aug 07, 1950          BSA:          2.142 m? Patient Age:    48 years           BP:           117/55 mmHg Patient Gender: F                  HR:           61 bpm. Exam Location:  Inpatient Procedure: 2D Echo, Cardiac Doppler and Color Doppler Indications:    R55 Syncope  History:        Patient has no prior history of Echocardiogram examinations.                 COPD; Risk Factors:Hypertension and Dyslipidemia.  Sonographer:    Bernadene Person RDCS Referring Phys: [place]  1. Left ventricular ejection fraction, by estimation, is 70 to 75%. The left ventricle has hyperdynamic function. The left ventricle has no regional wall motion abnormalities. Left ventricular diastolic parameters are consistent with Grade II diastolic dysfunction (pseudonormalization). Elevated left atrial pressure.  2. Right ventricular systolic function is normal. The right ventricular size is normal.  3. The mitral valve is normal in structure. No  evidence of mitral valve regurgitation. No evidence of mitral stenosis.  4. The aortic valve is normal in structure. Aortic valve regurgitation is not visualized. No aortic stenosis is present.  5. The inferior ven  Patient Name:   Cassandra Rios Date of Exam: 10/18/2021 Medical Rec #:  [id number]          Height:       66.0 in Accession #:    [id number]         Weight:       235.0 lb Date of Birth:  Aug 07, 1950          BSA:          2.142 m? Patient Age:    48 years           BP:           117/55 mmHg Patient Gender: F                  HR:           61 bpm. Exam Location:  Inpatient Procedure: 2D Echo, Cardiac Doppler and Color Doppler Indications:    R55 Syncope  History:        Patient has no prior history of Echocardiogram examinations.                 COPD; Risk Factors:Hypertension and Dyslipidemia.  Sonographer:    Bernadene Person RDCS Referring Phys: [place]  1. Left ventricular ejection fraction, by estimation, is 70 to 75%. The left ventricle has hyperdynamic function. The left ventricle has no regional wall motion abnormalities. Left ventricular diastolic parameters are consistent with Grade II diastolic dysfunction (pseudonormalization). Elevated left atrial pressure.  2. Right ventricular systolic function is normal. The right ventricular size is normal.  3. The mitral valve is normal in structure. No  evidence of mitral valve regurgitation. No evidence of mitral stenosis.  4. The aortic valve is normal in structure. Aortic valve regurgitation is not visualized. No aortic stenosis is present.  5. The inferior ven

## 2021-10-19 NOTE — TOC Initial Note (Addendum)
Transition of Care (TOC) - Initial/Assessment Note  ? ? ?Patient Details  ?Name: Nykita Muzzy ?MRN: [id number] ?Date of Birth: 1950-11-21 ? ?Transition of Care (TOC) CM/SW Contact:    ?Harriet Masson, RN ?Phone Number: ?10/19/2021, 9:35 AM ? ?Clinical Narrative:                ?Spoke to patient regarding transition needs. Patient lives with husband who can take her to apts and transport her home. Orders for home health. Patient defers to [facility] to find highly rated agency. Amy with [facility] accepted referral.  ?Patient has Ephraim Hamburger and shower chair at home.  ?Address, Phone number and PCP verified.  ? ?1609 DME 3&1 ordered. Patient agreeable to use in house provider. Spoke to [place]  ?And 3&1 will be delivered to the room prior to discharge.  ? ?Expected Discharge Plan: Home w Home Health Services ?Barriers to Discharge: Continued Medical Work up ? ? ?Patient Goals and CMS Choice ?Patient states their goals for this hospitalization and ongoing recovery are:: return home ?CMS Medicare.gov Compare Post Acute Care list provided to:: Patient ?Choice offered to / list presented to : Patient ? ?Expected Discharge Plan and Services ?Expected Discharge Plan: Home w Home Health Services ?  ?Discharge Planning Services: CM Consult ?Post Acute Care Choice: Home Health ?Living arrangements for the past 2 months: Single Family Home ?                ?  ?  ?  ?  ?  ?HH Arranged: PT, OT ?HH Agency: [facility] ?Date HH Agency Contacted: 10/19/21 ?Time HH Agency Contacted: 0935 ?Representative spoke with at [facility] Agency: Amy ? ?Prior Living Arrangements/Services ?Living arrangements for the past 2 months: Single Family Home ?Lives with:: Spouse ?Patient language and need for interpreter reviewed:: Yes ?Do you feel safe going back to the place where you live?: Yes      ?Need for Family Participation in Patient Care: Yes (Comment) ?Care giver support system in place?: Yes (comment) ?Current home services: DME (walker,  cane, shower chair) ?Criminal Activity/Legal Involvement Pertinent to Current Situation/Hospitalization: No - Comment as needed ? ?Activities of Daily Living ?  ?  ? ?Permission Sought/Granted ?Permission sought to share information with : Case Manager ?  ?   ? Permission granted to share info w AGENCY: HH ?   ?   ? ?Emotional Assessment ?Appearance:: Appears stated age ?Attitude/Demeanor/Rapport: Engaged ?Affect (typically observed): Accepting ?Orientation: : Oriented to Self, Oriented to Place, Oriented to  Time, Oriented to Situation ?Alcohol / Substance Use: Not Applicable ?Psych Involvement: No (comment) ? ?Admission diagnosis:  Syncope and collapse [R55] ?UGI bleed [K92.2] ?Upper GI bleed [K92.2] ?Patient Active Problem List  ? Diagnosis Date Noted  ? UGI bleed 10/17/2021  ? Misadventure of surgical procedure 09/28/2021  ? COVID-19 02/22/2021  ? COPD (chronic obstructive pulmonary disease) (HCC)   ? Lumbar spondylosis 11/05/2020  ? Lumbar pain 09/14/2020  ? Urinary incontinence, mixed 07/07/2020  ? Chronic midline low back pain without sciatica 04/20/2020  ? 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 Abdominal aortic atherosclerosis (HCC) 03/09/2020  ? Chronic back pain 03/09/2020  ? Cellulitis of breast 02/03/2020  ? Flank pain 02/03/2020  ? CAD (coronary artery disease), native coronary artery 07/09/2019  ? DOE (dyspnea on exertion) 04/21/2019  ? Preventative health care 04/21/2019  ? AAA (abdominal aortic aneurysm) (HCC) 04/19/2018  ? Chronic headaches 04/19/2018  ? Lichen sclerosus et atrophicus 03/31/2018  ?  Sinus headache 03/18/2018  ? Closed fracture of right distal fibula 12/29/2017  ? Ankle fracture 12/29/2017  ? 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 Impingement syndrome of left shoulder region 11/05/2017  ? Hyperlipidemia LDL goal <100 09/27/2017  ? Senile purpura (HCC) 03/29/2017  ? Lichen sclerosus of female genitalia 10/23/2016  ? Breast pain, left 03/27/2016  ?  Chest pain 03/27/2016  ? HTN (hypertension) 09/24/2015  ? Sinusitis, acute 03/23/2015  ? Depression 07/06/2014  ? Thoracic back pain 07/06/2014  ? UTI (urinary tract infection) 04/28/2014  ? Cellulitis and abscess of trunk 02/16/2014  ? Class 2 obesity due to excess calories with body mass index (BMI) of 37.0 to 37.9 in adult 06/30/2013  ? Breast mass, right 01/10/2013  ? Acute sinusitis 03/22/2009  ? MULTIPLE SCLEROSIS, RELAPSING/REMITTING 01/04/2009  ? THROMBOCYTOPENIA 09/15/2008  ? SHOULDER PAIN, LEFT 09/15/2008  ? BARIATRIC SURGERY STATUS 09/15/2008  ? Hypothyroidism 07/20/2008  ? HIATAL HERNIA 07/20/2008  ? COPD with acute bronchitis (HCC) 07/23/2007  ? ABNORMAL PULMONARY TEST RESULTS 07/23/2007  ? Pulmonary function studies abnormal 07/23/2007  ? Allergic rhinitis 06/26/2007  ? HOARSENESS, CHRONIC 06/26/2007  ? WHEEZING 06/26/2007  ? SNORING 06/26/2007  ? COUGH 06/26/2007  ? TOBACCO ABUSE-HISTORY OF 06/26/2007  ? COLONIC POLYPS, HYPERPLASTIC 06/25/2007  ? Hyperlipidemia 06/25/2007  ? Depression with anxiety 06/25/2007  ? GASTRIC ULCER 06/25/2007  ? COLONIC POLYPS, ADENOMATOUS, HX OF 06/25/2007  ? Anxiety 06/25/2007  ? ?PCP:  [name], DO ?Pharmacy:   ?[facility] [address] [place] - [address] ?[address] ?[place] [address] ?Phone: [phone] Fax: [phone] ? ?[id number] - [place], [place] - [address] ?[address] ?[place] [address] ?Phone: [phone] Fax: [phone] ? ?[facility] [place] [facility] [place], [place] - [address] ?[address] ?[place] [address] ?Phone: [phone] Fax: [phone] ? ?[name] [place] [place] - [address] ?[address] ?[place] [address] ?Phone: [phone] Fax: [phone] ? ? ? ? ?Social Determinants of Health (SDOH) Interventions ?  ? ?Readmission Risk Interventions ?   ? View : No data to display.  ?  ?  ?   ? ? ? ?

## 2021-10-19 NOTE — Evaluation (Signed)
Occupational Therapy Evaluation ?Patient Details ?Name: Cassandra Rios ?MRN: [id number] ?DOB: [birth year] ?Today's Date: 10/19/2021 ? ? ?History of Present Illness Pt is a 71 y/o female admitted secondary to syncope and upper GI bleed. Pt is s/p endoscopy that showed gastric ulcers. PMH includes MS, HTN, AAA, COPD, and CAD.  ? ?Clinical Impression ?  ?Pt ambulates with a RW at home and is otherwise independent. She lives with her husband. Pt presents with dark diarrhea requiring max to total assist for pericare, LE bathing and set up to change gown. Pt able to stand x 5 minutes, but then returning to sit because "I don't want to push it." Pt likely to progress well and not requires post acute OT once medical issues have resolved.  ?   ? ?Recommendations for follow up therapy are one component of a multi-disciplinary discharge planning process, led by the attending physician.  Recommendations may be updated based on patient status, additional functional criteria and insurance authorization.  ? ?Follow Up Recommendations ? No OT follow up  ?  ?Assistance Recommended at Discharge Intermittent Supervision/Assistance  ?Patient can return home with the following A lot of help with bathing/dressing/bathroom;A little help with walking and/or transfers;Assist for transportation;Help with stairs or ramp for entrance ? ?  ?Functional Status Assessment ? Patient has had a recent decline in their functional status and demonstrates the ability to make significant improvements in function in a reasonable and predictable amount of time.  ?Equipment Recommendations ? BSC/3in1 (vs tub seat)  ?  ?Recommendations for Other Services   ? ? ?  ?Precautions / Restrictions Precautions ?Precautions: Fall ?Precaution Comments: watch BP in standing ?Restrictions ?Weight Bearing Restrictions: No  ? ?  ? ?Mobility Bed Mobility ?  ?  ?  ?  ?  ?  ?  ?General bed mobility comments: pt in chair ?  ? ?Transfers ?Overall transfer level: Needs  assistance ?Equipment used: Rolling walker (2 wheels) ?Transfers: Sit to/from Stand ?Sit to Stand: Supervision ?  ?  ?  ?  ?  ?General transfer comment: good technique, stood x 5 minutes for pericare ?  ? ?  ?Balance Overall balance assessment: Needs assistance ?Sitting-balance support: No upper extremity supported ?Sitting balance-Leahy Scale: Good ?  ?  ?Standing balance support: Bilateral upper extremity supported ?Standing balance-Leahy Scale: Poor ?Standing balance comment: cues to initiate sitting and self monitor for symptoms of hypotension ?  ?  ?  ?  ?  ?  ?  ?  ?  ?  ?  ?   ? ?ADL either performed or assessed with clinical judgement  ? ?ADL Overall ADL's : Needs assistance/impaired ?Eating/Feeding: Independent ?  ?Grooming: Set up;Sitting ?  ?Upper Body Bathing: Set up;Sitting ?  ?Lower Body Bathing: Maximal assistance;Sit to/from stand ?Lower Body Bathing Details (indicate cue type and reason): assisted due to bloody diarrhea ?Upper Body Dressing : Set up;Sitting ?  ?Lower Body Dressing: Supervision/safety;Sit to/from stand ?  ?Toilet Transfer: Supervision/safety;Stand-pivot;Rolling walker (2 wheels) ?  ?Toileting- Clothing Manipulation and Hygiene: Total assistance;Sit to/from stand ?Toileting - Clothing Manipulation Details (indicate cue type and reason): assisted due to bloody diarrhea ?  ?  ?Functional mobility during ADLs: Supervision/safety;Rolling walker (2 wheels) ?   ? ? ? ?Vision Baseline Vision/History: 1 Wears glasses ?Ability to See in Adequate Light: 0 Adequate ?Patient Visual Report: No change from baseline ?   ?   ?Perception   ?  ?Praxis   ?  ? ?Pertinent Vitals/Pain Pain Assessment ?  Pain Assessment: No/denies pain  ? ? ? ?Hand Dominance Right ?  ?Extremity/Trunk Assessment Upper Extremity Assessment ?Upper Extremity Assessment: Overall WFL for tasks assessed ?  ?Lower Extremity Assessment ?Lower Extremity Assessment: Defer to PT evaluation ?  ?Cervical / Trunk Assessment ?Cervical /  Trunk Assessment: Normal ?  ?Communication Communication ?Communication: No difficulties ?  ?Cognition Arousal/Alertness: Awake/alert ?Behavior During Therapy: [facility] for tasks assessed/performed ?Overall Cognitive Status: Within Functional Limits for tasks assessed ?  ?  ?  ?  ?  ?  ?  ?  ?  ?  ?  ?  ?  ?  ?  ?  ?  ?  ?  ?General Comments  Educated pt on self-monitoring for activity tolerance ? ?  ?Exercises   ?  ?Shoulder Instructions    ? ? ?Home Living Family/patient expects to be discharged to:: Private residence ?Living Arrangements: Spouse/significant other ?Available Help at Discharge: Family;Available 24 hours/day ?Type of Home: House ?Home Access: Stairs to enter ?Entrance Stairs-Number of Steps: 1 ?Entrance Stairs-Rails: None ?Home Layout: One level ?  ?  ?  ?  ?Bathroom Toilet: Standard ?  ?  ?Home Equipment: Agricultural consultant (2 wheels) ?  ?  ?  ? ?  ?Prior Functioning/Environment Prior Level of Function : Independent/Modified Independent ?  ?  ?  ?  ?  ?  ?Mobility Comments: Uses RW for ambulation ?  ?  ? ?  ?  ?OT Problem List: Impaired balance (sitting and/or standing);Decreased strength ?  ?   ?OT Treatment/Interventions: Self-care/ADL training;DME and/or AE instruction;Therapeutic activities;Patient/family education;Balance training  ?  ?OT Goals(Current goals can be found in the care plan section) Acute Rehab OT Goals ?OT Goal Formulation: With patient ?Time For Goal Achievement: 11/02/21 ?Potential to Achieve Goals: Good ?ADL Goals ?Pt Will Perform Grooming: with modified independence;standing ?Pt Will Perform Lower Body Bathing: with modified independence;sit to/from stand ?Pt Will Perform Lower Body Dressing: with modified independence;sit to/from stand ?Pt Will Transfer to Toilet: with modified independence;ambulating ?Pt Will Perform Toileting - Clothing Manipulation and hygiene: with modified independence;sit to/from stand ?Additional ADL Goal #1: Pt will monitor for symptoms of hypotension and  initiate sitting.  ?OT Frequency: Min 2X/week ?  ? ?Co-evaluation   ?  ?  ?  ?  ? ?  ?AM-PAC OT "6 Clicks" Daily Activity     ?Outcome Measure Help from another person eating meals?: None ?Help from another person taking care of personal grooming?: A Little ?Help from another person toileting, which includes using toliet, bedpan, or urinal?: Total ?Help from another person bathing (including washing, rinsing, drying)?: A Lot ?Help from another person to put on and taking off regular upper body clothing?: None ?Help from another person to put on and taking off regular lower body clothing?: A Little ?6 Click Score: 17 ?  ?End of Session Equipment Utilized During Treatment: Rolling walker (2 wheels) ?Nurse Communication: Mobility status ? ?Activity Tolerance: Treatment limited secondary to medical complications (Comment) (bloody diarrhea) ?Patient left: in chair;with call bell/phone within reach;with chair alarm set ? ?OT Visit Diagnosis: Other abnormalities of gait and mobility (R26.89)  ?              ?Time: 1001-1041 ?OT Time Calculation (min): 40 min ?Charges:  OT General Charges ?$OT Visit: 1 Visit ?OT Evaluation ?$OT Eval Moderate Complexity: 1 Mod ?OT Treatments ?$Self Care/Home Management : 23-37 mins ? ?Martie Round, OTR/L ?Acute Rehabilitation Services ?Pager: [phone] ?Office: [phone]  ? ?Evern Bio ?  10/19/2021, 11:13 AM ?

## 2021-10-19 NOTE — Progress Notes (Signed)
Physical Therapy Treatment ?Patient Details ?Name: Cassandra Rios ?MRN: [id number] ?DOB: 05-08-1951 ?Today's Date: 10/19/2021 ? ? ?History of Present Illness Pt is a 71 y/o female admitted secondary to syncope and upper GI bleed. Pt is s/p endoscopy that showed gastric ulcers. PMH includes MS, HTN, AAA, COPD, and CAD. ? ?  ?PT Comments  ? ? Continuing work on functional mobility and activity tolerance;  Session focused on obtaining another set of orthsotatic vitals signs for continuing assessment of fall risk and activity tolerance; + for BP drop in standing initially and further drop at 3 minutes; See vitals flow sheets for details; Educated pt on making transitional moves slowly, and paying very close attention to self-monitor for activity tolerance; Dr. Jerral Ralph notified of BP response to upright standing activity ? ? ? 10/19/21 0927  ?Vital Signs  ?Patient Position (if appropriate) Orthostatic Vitals  ?Orthostatic Lying   ?BP- Lying 143/78  ?Pulse- Lying 65  ?Orthostatic Sitting  ?BP- Sitting (!) 143/93  ?Pulse- Sitting 71  ?Orthostatic Standing at 0 minutes  ?BP- Standing at 0 minutes 104/56  ?Pulse- Standing at 0 minutes 83 ?(Reports feeling "less confident" "not normal")  ?Orthostatic Standing at 3 minutes  ?BP- Standing at 3 minutes 91/79  ?Pulse- Standing at 3 minutes 78 ?("swoozy")  ? ?  ?Recommendations for follow up therapy are one component of a multi-disciplinary discharge planning process, led by the attending physician.  Recommendations may be updated based on patient status, additional functional criteria and insurance authorization. ? ?Follow Up Recommendations ? Home health PT ?  ?  ?Assistance Recommended at Discharge Intermittent Supervision/Assistance  ?Patient can return home with the following Help with stairs or ramp for entrance;Assist for transportation;Assistance with cooking/housework ?  ?Equipment Recommendations ? Rollator (4 wheels) (to give pt the opportunity to sit if/when needed)   ?  ?Recommendations for Other Services   ? ? ?  ?Precautions / Restrictions Precautions ?Precautions: Fall ?Precaution Comments: significant BP drop with standing activity  ?  ? ?Mobility ? Bed Mobility ?Overal bed mobility: Needs Assistance ?Bed Mobility: Supine to Sit ?  ?  ?Supine to sit: Min assist ?  ?  ?General bed mobility comments: Min handheld assist ?  ? ?Transfers ?Overall transfer level: Needs assistance ?Equipment used: Rolling walker (2 wheels) ?Transfers: Sit to/from Stand, Bed to chair/wheelchair/BSC ?Sit to Stand: Min guard ?  ?Step pivot transfers: Min assist ?  ?  ?  ?General transfer comment: [place] for safety; smoother rise, without th eneed for rocking; MIn assist to manage RW with pivot steps bed to standing in front of recliner; Cues to self-monitor for activity tolerance  ?  ? ?Ambulation/Gait ?  ?  ?  ?  ?  ?  ?  ?  ? ? ?Stairs ?  ?  ?  ?  ?  ? ? ?Wheelchair Mobility ?  ? ?Modified Rankin (Stroke Patients Only) ?  ? ? ?  ?Balance   ?  ?Sitting balance-Leahy Scale: Good ?  ?  ?  ?Standing balance-Leahy Scale: Poor ?Standing balance comment: Reliant on UE support; Begins to feel uneasy and "less confident" at 1 minute standing; "swoozy' at 3 minutes standing, concurrent with significant BP drop; See vitals flow sheet.  ?  ?  ?  ?  ?  ?  ?  ?  ?  ?  ?  ?  ? ?  ?Cognition Arousal/Alertness: Awake/alert ?Behavior During Therapy: [facility] for tasks assessed/performed ?Overall Cognitive Status: Within Functional Limits  for tasks assessed ?  ?  ?  ?  ?  ?  ?  ?  ?  ?  ?  ?  ?  ?  ?  ?  ?  ?  ?  ? ?  ?Exercises   ? ?  ?General Comments General comments (skin integrity, edema, etc.): Educated pt on self-monitoring for activity tolerance ?  ?  ? ?Pertinent Vitals/Pain Pain Assessment ?Pain Assessment: Faces ?Faces Pain Scale: Hurts a little bit ?Pain Location: headache and back ?Pain Descriptors / Indicators: Headache, Sore ?Pain Intervention(s): Monitored during session, Repositioned  ? ? ?Home Living    ?Living Arrangements: Spouse/significant other ?  ?  ?  ?  ?  ?  ?  ?  ?   ?  ?Prior Function    ?  ?  ?   ? ?PT Goals (current goals can now be found in the care plan section) Acute Rehab PT Goals ?Patient Stated Goal: to go home ?PT Goal Formulation: With patient ?Time For Goal Achievement: 11/01/21 ?Potential to Achieve Goals: Good ?Progress towards PT goals: Progressing toward goals ? ?  ?Frequency ? ? ? Min 3X/week ? ? ? ?  ?PT Plan Current plan remains appropriate  ? ? ?Co-evaluation   ?  ?  ?  ?  ? ?  ?AM-PAC PT "6 Clicks" Mobility   ?Outcome Measure ? Help needed turning from your back to your side while in a flat bed without using bedrails?: None ?Help needed moving from lying on your back to sitting on the side of a flat bed without using bedrails?: A Little ?Help needed moving to and from a bed to a chair (including a wheelchair)?: A Little ?Help needed standing up from a chair using your arms (e.g., wheelchair or bedside chair)?: A Little ?Help needed to walk in hospital room?: A Little ?Help needed climbing 3-5 steps with a railing? : A Lot ?6 Click Score: 18 ? ?  ?End of Session Equipment Utilized During Treatment: Gait belt ?Activity Tolerance: Treatment limited secondary to medical complications (Comment) (+ orthostatics) ?Patient left: in chair;with call bell/phone within reach;with chair alarm set ?Nurse Communication: Mobility status ?PT Visit Diagnosis: Unsteadiness on feet (R26.81);Muscle weakness (generalized) (M62.81) ?  ? ? ?Time: 5784-6962 ?PT Time Calculation (min) (ACUTE ONLY): 35 min ? ?Charges:  $Therapeutic Activity: 23-37 mins          ?          ? ?Van Clines, PT  ?Acute Rehabilitation Services ?Office [phone] ? ? ? ?[name] ?10/19/2021, 10:19 AM ? ?

## 2021-10-20 DIAGNOSIS — E039 Hypothyroidism, unspecified: Secondary | ICD-10-CM | POA: Diagnosis not present

## 2021-10-20 DIAGNOSIS — I714 Abdominal aortic aneurysm, without rupture, unspecified: Secondary | ICD-10-CM | POA: Diagnosis not present

## 2021-10-20 DIAGNOSIS — K922 Gastrointestinal hemorrhage, unspecified: Secondary | ICD-10-CM | POA: Diagnosis not present

## 2021-10-20 DIAGNOSIS — I10 Essential (primary) hypertension: Secondary | ICD-10-CM | POA: Diagnosis not present

## 2021-10-20 LAB — CBC
HCT: 25.7 % — ABNORMAL LOW (ref 36.0–46.0)
Hemoglobin: 8.6 g/dL — ABNORMAL LOW (ref 12.0–15.0)
MCH: 30.6 pg (ref 26.0–34.0)
MCHC: 33.5 g/dL (ref 30.0–36.0)
MCV: 91.5 fL (ref 80.0–100.0)
Platelets: 123 10*3/uL — ABNORMAL LOW (ref 150–400)
RBC: 2.81 MIL/uL — ABNORMAL LOW (ref 3.87–5.11)
RDW: 13.6 % (ref 11.5–15.5)
WBC: 7.7 10*3/uL (ref 4.0–10.5)
nRBC: 0 % (ref 0.0–0.2)

## 2021-10-20 MED ORDER — SODIUM CHLORIDE 0.9 % IV SOLN
INTRAVENOUS | Status: DC
Start: 2021-10-20 — End: 2021-10-21

## 2021-10-20 NOTE — Care Management Important Message (Signed)
Important Message  Patient Details  Name: Cassandra Rios MRN: [id number] Date of Birth: [birth year]   Medicare Important Message Given:  Yes     Orbie Pyo 10/20/2021, 2:57 PM

## 2021-10-20 NOTE — Progress Notes (Signed)
Physical Therapy Treatment Patient Details Name: Cassandra Rios MRN: [id number] DOB: Aug 18, 1950 Today's Date: 10/20/2021   History of Present Illness Pt is a 71 y/o female admitted secondary to syncope and upper GI bleed. Pt is s/p endoscopy that showed gastric ulcers. PMH includes MS, HTN, AAA, COPD, and CAD.    PT Comments    Pt received in chair with TED hose on, agreeable to therapy session with good participation and improved tolerance for standing and gait training at bedside.Pt continues to demonstrate mild symptomatic orthostatic hypotension (see MAP below) but able to perform short household distance gait trial at bedside with RW and min guard for safety with seated breaks. Pt Supervision for transfers with safety cues. Pt needing assist for peri-care due to instability and back pain and increased time to perform tasks due to need for frequent BP checks and due to chronic back pain. RN notified pt would benefit from getting to/from [facility] more frequently with staff assist due to pt c/o skin breakdown in peri area from purewick. Pt continues to benefit from PT services to progress toward functional mobility goals.  Orthostatic BPs Sitting (feet down) L arm 98/86 (93)  Standing L arm 101/68 (78)  Standing after 4 min L arm 95/51 (64) "feeling weird"  Seated (feet down) L arm 111/65 (77)     Recommendations for follow up therapy are one component of a multi-disciplinary discharge planning process, led by the attending physician.  Recommendations may be updated based on patient status, additional functional criteria and insurance authorization.  Follow Up Recommendations  Home health PT     Assistance Recommended at Discharge Intermittent Supervision/Assistance  Patient can return home with the following Help with stairs or ramp for entrance;Assist for transportation;Assistance with cooking/housework;A little help with walking and/or transfers   Equipment Recommendations  Rollator  (4 wheels) (walker with sitting surface to decrease fall risk due to unstable diastolic BP with sx fatigue.)    Recommendations for Other Services       Precautions / Restrictions Precautions Precautions: Fall Precaution Comments: symptomatic orthostatic hypotension Required Braces or Orthoses:  (TED hose to help with hemodynamics) Restrictions Weight Bearing Restrictions: No     Mobility  Bed Mobility               General bed mobility comments: pt in chair pre/post    Transfers Overall transfer level: Needs assistance Equipment used: Rolling walker (2 wheels) Transfers: Sit to/from Stand Sit to Stand: Supervision           General transfer comment: cues for safer UE placement (to push from chair not RW); x5 total reps with transfers/seated breaks    Ambulation/Gait Ambulation/Gait assistance: Min guard Gait Distance (Feet): 25 Feet (85ft to BSC, seated break, then 95ft) Assistive device: Rolling walker (2 wheels) Gait Pattern/deviations: Step-through pattern, Wide base of support       General Gait Details: fair RW management but pt needs cues for directional navigation due to multiple lines; SpO2 WFL on RA     Balance Overall balance assessment: Needs assistance Sitting-balance support: No upper extremity supported Sitting balance-Leahy Scale: Good     Standing balance support: Bilateral upper extremity supported Standing balance-Leahy Scale: Poor Standing balance comment: cues to initiate sitting and self monitor for symptoms of hypotension; reliant on U UE support for static standing and BUE support for dynamic standing tasks  Cognition Arousal/Alertness: Awake/alert Behavior During Therapy: WFL for tasks assessed/performed Overall Cognitive Status: Within Functional Limits for tasks assessed                                 General Comments: Pt with some decreased insight into safety with  transfers and due to symptomatic orthostatic hypotension but with improved standing tolerance for short periods of time this date. Pt eager to get OOB more and BSC in room, NT notified she does well with +1 assist for pivotal transfers so she can be helped up more frequently for toileting if BP sx remain improved. Tangential at times.           General Comments General comments (skin integrity, edema, etc.): see comments above; pt with symptomatic drop in MAP/diastolic BP with positional changes and feeling "odd" after standing ~5 mins but improves with seated breaks      Pertinent Vitals/Pain Pain Assessment Pain Assessment: 0-10 Pain Score: 9  Faces Pain Scale: Hurts even more Pain Location: low back pain, increases with time standing (pt initially states "10/10" but sx not correlating with this amount). Pain Descriptors / Indicators: Sore, Grimacing, Discomfort Pain Intervention(s): Monitored during session, Repositioned     PT Goals (current goals can now be found in the care plan section) Acute Rehab PT Goals Patient Stated Goal: to go home PT Goal Formulation: With patient Time For Goal Achievement: 11/01/21 Progress towards PT goals: Progressing toward goals    Frequency    Min 3X/week      PT Plan Current plan remains appropriate       AM-PAC PT "6 Clicks" Mobility   Outcome Measure  Help needed turning from your back to your side while in a flat bed without using bedrails?: None Help needed moving from lying on your back to sitting on the side of a flat bed without using bedrails?: A Little Help needed moving to and from a bed to a chair (including a wheelchair)?: A Little Help needed standing up from a chair using your arms (e.g., wheelchair or bedside chair)?: A Little Help needed to walk in hospital room?: A Little Help needed climbing 3-5 steps with a railing? : A Lot 6 Click Score: 18    End of Session Equipment Utilized During Treatment: Gait  belt Activity Tolerance: Patient tolerated treatment well Patient left: in chair;with call bell/phone within reach;with chair alarm set Nurse Communication: Mobility status;Other (comment) (vitals in flowsheet) PT Visit Diagnosis: Unsteadiness on feet (R26.81);Muscle weakness (generalized) (M62.81)     Time: 4098-1191 PT Time Calculation (min) (ACUTE ONLY): 43 min  Charges:  $Gait Training: 8-22 mins $Therapeutic Activity: 23-37 mins                     Witney Huie P., PTA Acute Rehabilitation Services Secure Chat Preferred 9a-5:30pm Office: [phone]    Dorathy Kinsman [facility] 10/20/2021, 4:09 PM

## 2021-10-20 NOTE — Progress Notes (Signed)
Subjective: Feels weak. Little more black stool yesterday.  Objective: Vital signs in last 24 hours: Temp:  [97.6 F (36.4 C)-98 F (36.7 C)] 98 F (36.7 C) (05/18 1213) Pulse Rate:  [67-89] 70 (05/18 1213) Resp:  [13-16] 16 (05/18 1213) BP: (114-148)/(56-90) 148/86 (05/18 1213) SpO2:  [96 %-100 %] 97 % (05/18 1213) Weight change:  Last BM Date : 10/20/21  PE: GEN:  NAD HEENT:  [place]/AT SKIN:  [place] NEURO:  A/O, no encephalopathy  Lab Results: CBC    Component Value Date/Time   WBC 7.7 10/20/2021 0624   RBC 2.81 (L) 10/20/2021 0624   HGB 8.6 (L) 10/20/2021 0624   HGB 14.7 05/19/2020 0936   HGB 14.3 07/07/2010 1530   HCT 25.7 (L) 10/20/2021 0624   HCT 43.9 05/19/2020 0936   HCT 42.1 07/07/2010 1530   PLT 123 (L) 10/20/2021 0624   PLT 123 (L) 05/19/2020 0936   MCV 91.5 10/20/2021 0624   MCV 89 05/19/2020 0936   MCV 89 07/07/2010 1530   MCH 30.6 10/20/2021 0624   MCHC 33.5 10/20/2021 0624   RDW 13.6 10/20/2021 0624   RDW 12.4 05/19/2020 0936   RDW 11.4 07/07/2010 1530   LYMPHSABS 1.7 10/19/2021 0533   LYMPHSABS 2.4 05/19/2020 0936   LYMPHSABS 2.6 07/07/2010 1530   MONOABS 0.5 10/19/2021 0533   EOSABS 0.2 10/19/2021 0533   EOSABS 0.2 05/19/2020 0936   EOSABS 0.2 07/07/2010 1530   BASOSABS 0.0 10/19/2021 0533   BASOSABS 0.0 05/19/2020 0936   BASOSABS 0.0 07/07/2010 1530  CMP     Component Value Date/Time   NA 136 10/18/2021 0535   NA 138 07/03/2019 1515   K 4.2 10/18/2021 0535   CL 108 10/18/2021 0535   CO2 22 10/18/2021 0535   GLUCOSE 98 10/18/2021 0535   BUN 32 (H) 10/18/2021 0535   BUN 12 07/03/2019 1515   CREATININE 0.95 10/18/2021 0535   CREATININE 1.04 05/13/2014 0840   CALCIUM 8.3 (L) 10/18/2021 0535   PROT 5.8 (L) 10/17/2021 0404   PROT 6.2 08/26/2019 0731   ALBUMIN 2.9 (L) 10/17/2021 0404   ALBUMIN 3.9 08/26/2019 0731   AST 26 10/17/2021 0404   ALT 22 10/17/2021 0404   ALKPHOS 60 10/17/2021 0404   BILITOT 0.9 10/17/2021 0404   BILITOT 0.3  08/26/2019 0731   GFRNONAA >60 10/18/2021 0535   GFRNONAA 57 (L) 05/13/2014 0840   GFRAA 67 07/03/2019 1515   GFRAA 66 05/13/2014 0840   Assessment:  Acute blood loss anemia. Melena, hematemesis.  Suspect passing old blood, doubt ongoing bleeding. Gastric ulcer, due to #1 and #2 above.  Likely NSAIDs; H pylori negative.  Plan:   Pantoprazole 40 mg po bid until further notice. Soft diet. No ASA/NSAIDs. [facility] will sign-off; will make arrangements for outpatient CBC and outpatient office visit with [name]; thank you for the consultation; please call with questions.   Cassandra Rios 10/20/2021, 3:08 PM   Cell [phone] If no answer or after 5 PM call [phone]

## 2021-10-20 NOTE — Progress Notes (Signed)
PROGRESS NOTE        PATIENT DETAILS Name: Cassandra Rios Age: 71 y.o. Sex: female Date of Birth: 02-07-1951 Admit Date: 10/17/2021 Admitting Physician Kristopher Oppenheim, DO [id number] Koren Shiver, DO  Brief Summary: Patient is a 71 y.o.  female with history of HTN, HLD, hypothyroidism, multiple sclerosis, COPD, AAA-presented with upper GI bleeding with acute blood loss anemia and episode of syncope.  See below for further details.   Significant events: 5/15>> admitted with syncope/melanotic stools-upper GI bleeding with acute blood loss anemia  Significant studies: 5/14>> CXR: No PNA 5/14>> CT head: No acute intracranial abnormality. 5/14>> CT C-spine: No fracture-multilevel degenerative changes   Significant microbiology data:   Procedures: 5/15>> EGD: Non bleeding gastric ulcers with pigmented material.  Consults: GI  Subjective: Had multiple black-colored stools yesterday.  Was very dizzy when she stood up-was not able to ambulate.  Objective: Vitals: Blood pressure 130/87, pulse 69, temperature 97.8 F (36.6 C), temperature source Oral, resp. rate 15, height '5\' 6"'$  (1.676 m), weight 106.6 kg, SpO2 100 %.   Exam: Gen Exam:Alert awake-not in any distress HEENT:atraumatic, normocephalic Chest: B/L clear to auscultation anteriorly CVS:S1S2 regular Abdomen:soft non tender, non distended Extremities:no edema Neurology: Non focal Skin: no rash   Pertinent Labs/Radiology:    Latest Ref Rng & Units 10/20/2021    6:24 AM 10/19/2021    5:33 AM 10/18/2021    5:03 PM  CBC  WBC 4.0 - 10.5 K/uL 7.7   7.7   9.4    Hemoglobin 12.0 - 15.0 g/dL 8.6   8.0   9.0    Hematocrit 36.0 - 46.0 % 25.7   24.4   27.2    Platelets 150 - 400 K/uL 123   110   129      Lab Results  Component Value Date   NA 136 10/18/2021   K 4.2 10/18/2021   CL 108 10/18/2021   CO2 22 10/18/2021       Assessment/Plan: Upper GI bleeding with acute blood loss anemia due  to gastric ulcers: Hb stable-had several dark stools yesterday-likely reflecting old blood making its way out.  H. pylori serology negative.  Unfortunately-remains orthostatic-rechecking orthostatic vital signs-awaiting reevaluation with PT to see if she can ambulate today (unable to ambulate yesterday due to dizziness)  Syncope: Likely orthostatic mechanism-telemetry negative-Echo with stable EF.   Orthostatic hypotension: Very symptomatic yesterday-unable to ambulate with therapy.  Hydrated with IVF overnight-I have asked RN to see if we can get thigh-high TED stockings.  Awaiting repeat orthostatic vital signs-awaiting reevaluation by PT to see if she is able to ambulate today.    HTN: BP on the higher side-allow some amount of permissive hypertension given orthostatic vital signs.  Continue to hold all antihypertensives.    HLD: Continue Lipitor.  Hypothyroidism: Continue Synthroid.  Mood disorder: Continue fluoxetine  AAA: Continue outpatient follow-up with vascular surgery  Multiple sclerosis: Interferon beta-1b resumed.  Obesity: Estimated body mass index is 37.93 kg/m as calculated from the following:   Height as of this encounter: '5\' 6"'$  (1.676 m).   Weight as of this encounter: 106.6 kg.   Code status:   Code Status: DNR   DVT Prophylaxis: Place TED hose Start: 10/19/21 0929 SCDs Start: 10/17/21 1401   Family Communication: None at bedside   Disposition Plan: Status is: Inpatient Remains  PROGRESS NOTE        PATIENT DETAILS Name: Cassandra Rios Age: 71 y.o. Sex: female Date of Birth: 02-07-1951 Admit Date: 10/17/2021 Admitting Physician Kristopher Oppenheim, DO QBV:[id number] Koren Shiver, DO  Brief Summary: Patient is a 71 y.o.  female with history of HTN, HLD, hypothyroidism, multiple sclerosis, COPD, AAA-presented with upper GI bleeding with acute blood loss anemia and episode of syncope.  See below for further details.   Significant events: 5/15>> admitted with syncope/melanotic stools-upper GI bleeding with acute blood loss anemia  Significant studies: 5/14>> CXR: No PNA 5/14>> CT head: No acute intracranial abnormality. 5/14>> CT C-spine: No fracture-multilevel degenerative changes   Significant microbiology data:   Procedures: 5/15>> EGD: Non bleeding gastric ulcers with pigmented material.  Consults: GI  Subjective: Had multiple black-colored stools yesterday.  Was very dizzy when she stood up-was not able to ambulate.  Objective: Vitals: Blood pressure 130/87, pulse 69, temperature 97.8 F (36.6 C), temperature source Oral, resp. rate 15, height '5\' 6"'$  (1.676 m), weight 106.6 kg, SpO2 100 %.   Exam: Gen Exam:Alert awake-not in any distress HEENT:atraumatic, normocephalic Chest: B/L clear to auscultation anteriorly CVS:S1S2 regular Abdomen:soft non tender, non distended Extremities:no edema Neurology: Non focal Skin: no rash   Pertinent Labs/Radiology:    Latest Ref Rng & Units 10/20/2021    6:24 AM 10/19/2021    5:33 AM 10/18/2021    5:03 PM  CBC  WBC 4.0 - 10.5 K/uL 7.7   7.7   9.4    Hemoglobin 12.0 - 15.0 g/dL 8.6   8.0   9.0    Hematocrit 36.0 - 46.0 % 25.7   24.4   27.2    Platelets 150 - 400 K/uL 123   110   129      Lab Results  Component Value Date   NA 136 10/18/2021   K 4.2 10/18/2021   CL 108 10/18/2021   CO2 22 10/18/2021       Assessment/Plan: Upper GI bleeding with acute blood loss anemia due  to gastric ulcers: Hb stable-had several dark stools yesterday-likely reflecting old blood making its way out.  H. pylori serology negative.  Unfortunately-remains orthostatic-rechecking orthostatic vital signs-awaiting reevaluation with PT to see if she can ambulate today (unable to ambulate yesterday due to dizziness)  Syncope: Likely orthostatic mechanism-telemetry negative-Echo with stable EF.   Orthostatic hypotension: Very symptomatic yesterday-unable to ambulate with therapy.  Hydrated with IVF overnight-I have asked RN to see if we can get thigh-high TED stockings.  Awaiting repeat orthostatic vital signs-awaiting reevaluation by PT to see if she is able to ambulate today.    HTN: BP on the higher side-allow some amount of permissive hypertension given orthostatic vital signs.  Continue to hold all antihypertensives.    HLD: Continue Lipitor.  Hypothyroidism: Continue Synthroid.  Mood disorder: Continue fluoxetine  AAA: Continue outpatient follow-up with vascular surgery  Multiple sclerosis: Interferon beta-1b resumed.  Obesity: Estimated body mass index is 37.93 kg/m as calculated from the following:   Height as of this encounter: '5\' 6"'$  (1.676 m).   Weight as of this encounter: 106.6 kg.   Code status:   Code Status: DNR   DVT Prophylaxis: Place TED hose Start: 10/19/21 0929 SCDs Start: 10/17/21 1401   Family Communication: None at bedside   Disposition Plan: Status is: Inpatient Remains  Sepsis Labs: Lactic Acid, Venous No results found for: LATICACIDVEN  MICROBIOLOGY: No results found for this or any previous visit (from the past 240 hour(s)).  RADIOLOGY STUDIES/RESULTS: ECHOCARDIOGRAM COMPLETE  Result Date: 10/18/2021    ECHOCARDIOGRAM REPORT   Patient Name:   GIZELLA BELLEVILLE Date of Exam: 10/18/2021 Medical Rec #:  [id number]          Height:       66.0 in Accession #:    [id number]         Weight:       235.0 lb Date of Birth:  September 17, 1950          BSA:          2.142 m Patient Age:    15 years           BP:           117/55 mmHg Patient Gender: F                  HR:           61 bpm. Exam Location:  Inpatient Procedure: 2D Echo, Cardiac Doppler and Color Doppler Indications:    R55 Syncope  History:        Patient has no prior history of Echocardiogram examinations.                 COPD; Risk Factors:Hypertension and Dyslipidemia.  Sonographer:    Bernadene Person RDCS Referring Phys: [place]  1. Left ventricular ejection fraction, by estimation, is 70 to 75%. The left ventricle has hyperdynamic function. The left ventricle has no regional wall motion abnormalities. Left ventricular diastolic parameters are consistent with Grade II diastolic dysfunction (pseudonormalization). Elevated left atrial pressure.  2.  Right ventricular systolic function is normal. The right ventricular size is normal.  3. The mitral valve is normal in structure. No evidence of mitral valve regurgitation. No evidence of mitral stenosis.  4. The aortic valve is normal in structure. Aortic valve regurgitation is not visualized. No aortic stenosis is present.  5. The inferior vena cava is normal in size with <50% respiratory variability, suggesting right atrial pressure of 8 mmHg. FINDINGS  Left Ventricle: Left ventricular ejection fraction, by estimation, is 70 to 75%. The left ventricle has hyperdynamic function. The left ventricle has no regional wall motion abnormalities. The left ventricular internal cavity size was normal in size. There is no left ventricular hypertrophy. Left ventricular diastolic parameters are consistent with Grade II diastolic dysfunction (pseudonormalization). Elevated left atrial pressure. Right Ventricle: The right ventricular size is normal. No increase in right ventricular wall thickness. Right ventricular systolic function is normal. Left Atrium: Left atrial size was normal in size. Right Atrium: Right atrial size was normal in size. Pericardium: There is no evidence of pericardial effusion. Mitral Valve: The mitral valve is normal in structure. No evidence of mitral valve regurgitation. No evidence of mitral valve stenosis. Tricuspid Valve: The tricuspid valve is normal in structure. Tricuspid valve regurgitation is not demonstrated. No evidence of tricuspid stenosis. Aortic Valve: The aortic valve is normal in structure. Aortic valve regurgitation is not visualized. No aortic stenosis is present. Pulmonic Valve: The pulmonic valve was normal in structure. Pulmonic valve regurgitation is not visualized. No evidence of pulmonic stenosis. Aorta: The aortic root is normal in size and structure. Venous: The inferior vena cava is normal in size with less than 50% respiratory variability, suggesting right atrial pressure  of 8  Sepsis Labs: Lactic Acid, Venous No results found for: LATICACIDVEN  MICROBIOLOGY: No results found for this or any previous visit (from the past 240 hour(s)).  RADIOLOGY STUDIES/RESULTS: ECHOCARDIOGRAM COMPLETE  Result Date: 10/18/2021    ECHOCARDIOGRAM REPORT   Patient Name:   GIZELLA BELLEVILLE Date of Exam: 10/18/2021 Medical Rec #:  [id number]          Height:       66.0 in Accession #:    [id number]         Weight:       235.0 lb Date of Birth:  September 17, 1950          BSA:          2.142 m Patient Age:    15 years           BP:           117/55 mmHg Patient Gender: F                  HR:           61 bpm. Exam Location:  Inpatient Procedure: 2D Echo, Cardiac Doppler and Color Doppler Indications:    R55 Syncope  History:        Patient has no prior history of Echocardiogram examinations.                 COPD; Risk Factors:Hypertension and Dyslipidemia.  Sonographer:    Bernadene Person RDCS Referring Phys: [place]  1. Left ventricular ejection fraction, by estimation, is 70 to 75%. The left ventricle has hyperdynamic function. The left ventricle has no regional wall motion abnormalities. Left ventricular diastolic parameters are consistent with Grade II diastolic dysfunction (pseudonormalization). Elevated left atrial pressure.  2.  Right ventricular systolic function is normal. The right ventricular size is normal.  3. The mitral valve is normal in structure. No evidence of mitral valve regurgitation. No evidence of mitral stenosis.  4. The aortic valve is normal in structure. Aortic valve regurgitation is not visualized. No aortic stenosis is present.  5. The inferior vena cava is normal in size with <50% respiratory variability, suggesting right atrial pressure of 8 mmHg. FINDINGS  Left Ventricle: Left ventricular ejection fraction, by estimation, is 70 to 75%. The left ventricle has hyperdynamic function. The left ventricle has no regional wall motion abnormalities. The left ventricular internal cavity size was normal in size. There is no left ventricular hypertrophy. Left ventricular diastolic parameters are consistent with Grade II diastolic dysfunction (pseudonormalization). Elevated left atrial pressure. Right Ventricle: The right ventricular size is normal. No increase in right ventricular wall thickness. Right ventricular systolic function is normal. Left Atrium: Left atrial size was normal in size. Right Atrium: Right atrial size was normal in size. Pericardium: There is no evidence of pericardial effusion. Mitral Valve: The mitral valve is normal in structure. No evidence of mitral valve regurgitation. No evidence of mitral valve stenosis. Tricuspid Valve: The tricuspid valve is normal in structure. Tricuspid valve regurgitation is not demonstrated. No evidence of tricuspid stenosis. Aortic Valve: The aortic valve is normal in structure. Aortic valve regurgitation is not visualized. No aortic stenosis is present. Pulmonic Valve: The pulmonic valve was normal in structure. Pulmonic valve regurgitation is not visualized. No evidence of pulmonic stenosis. Aorta: The aortic root is normal in size and structure. Venous: The inferior vena cava is normal in size with less than 50% respiratory variability, suggesting right atrial pressure  of 8

## 2021-10-21 DIAGNOSIS — K922 Gastrointestinal hemorrhage, unspecified: Secondary | ICD-10-CM | POA: Diagnosis not present

## 2021-10-21 DIAGNOSIS — E039 Hypothyroidism, unspecified: Secondary | ICD-10-CM | POA: Diagnosis not present

## 2021-10-21 DIAGNOSIS — G35 Multiple sclerosis: Secondary | ICD-10-CM

## 2021-10-21 DIAGNOSIS — I714 Abdominal aortic aneurysm, without rupture, unspecified: Secondary | ICD-10-CM | POA: Diagnosis not present

## 2021-10-21 LAB — CBC
HCT: 23.6 % — ABNORMAL LOW (ref 36.0–46.0)
Hemoglobin: 7.9 g/dL — ABNORMAL LOW (ref 12.0–15.0)
MCH: 30.4 pg (ref 26.0–34.0)
MCHC: 33.5 g/dL (ref 30.0–36.0)
MCV: 90.8 fL (ref 80.0–100.0)
Platelets: 107 10*3/uL — ABNORMAL LOW (ref 150–400)
RBC: 2.6 MIL/uL — ABNORMAL LOW (ref 3.87–5.11)
RDW: 13.9 % (ref 11.5–15.5)
WBC: 7.4 10*3/uL (ref 4.0–10.5)
nRBC: 0 % (ref 0.0–0.2)

## 2021-10-21 MED ORDER — PANTOPRAZOLE SODIUM 40 MG PO TBEC: 40.0000 mg | DELAYED_RELEASE_TABLET | Freq: Two times a day (BID) | ORAL | 3 refills | Status: DC

## 2021-10-21 MED ORDER — FERROUS SULFATE 325 (65 FE) MG PO TBEC: 325.0000 mg | DELAYED_RELEASE_TABLET | Freq: Two times a day (BID) | ORAL | 0 refills | Status: DC

## 2021-10-21 NOTE — Progress Notes (Signed)
patient still has concerns regarding most recent hemoglobin (lowest its been this admission) and orthostatic BP with PT. patient not comfortable with discharge.

## 2021-10-21 NOTE — Progress Notes (Signed)
Physical Therapy Treatment Patient Details Name: Cassandra Rios MRN: [id number] DOB: [birth year] Today's Date: 10/21/2021   History of Present Illness Pt is a 71 y/o female admitted secondary to syncope and upper GI bleed. Pt is s/p endoscopy that showed gastric ulcers. PMH includes MS, HTN, AAA, COPD, and CAD.    PT Comments    Pt received in bed.  Supervision bed mobility, supervision transfers, and min guard assist ambulation 62' with RW. +orthostatics but pt only reporting minimal dizziness. Educated pt on increased time with position changes, TED hose, and ankle pumps. Pt in recliner with feet elevated at end of session.                                      BP Supine                    148/80 Sitting                     144/86 Initial stand             119/85 Stand after 3 min    117/73 In recliner                113/58    Recommendations for follow up therapy are one component of a multi-disciplinary discharge planning process, led by the attending physician.  Recommendations may be updated based on patient status, additional functional criteria and insurance authorization.  Follow Up Recommendations  Home health PT     Assistance Recommended at Discharge Intermittent Supervision/Assistance  Patient can return home with the following Help with stairs or ramp for entrance;Assist for transportation;Assistance with cooking/housework;A little help with walking and/or transfers   Equipment Recommendations  Rollator (4 wheels)    Recommendations for Other Services       Precautions / Restrictions Precautions Precautions: Fall;Other (comment) Precaution Comments: +orthostatics, mild dizziness     Mobility  Bed Mobility Overal bed mobility: Needs Assistance Bed Mobility: Supine to Sit     Supine to sit: Supervision, HOB elevated     General bed mobility comments: +rail, increased time    Transfers Overall transfer level: Needs assistance Equipment used: Rolling  walker (2 wheels) Transfers: Sit to/from Stand Sit to Stand: Supervision           General transfer comment: cues for hand placement    Ambulation/Gait Ambulation/Gait assistance: Min guard Gait Distance (Feet): 75 Feet Assistive device: Rolling walker (2 wheels) Gait Pattern/deviations: Step-through pattern, Decreased stride length Gait velocity: decreased Gait velocity interpretation: <1.31 ft/sec, indicative of household ambulator   General Gait Details: slow, steady gait with RW   Stairs             Wheelchair Mobility    Modified Rankin (Stroke Patients Only)       Balance Overall balance assessment: Needs assistance Sitting-balance support: No upper extremity supported, Feet supported Sitting balance-Leahy Scale: Good     Standing balance support: Bilateral upper extremity supported, During functional activity, Reliant on assistive device for balance Standing balance-Leahy Scale: Poor                              Cognition Arousal/Alertness: Awake/alert Behavior During Therapy: WFL for tasks assessed/performed Overall Cognitive Status: Within Functional Limits for tasks assessed  Exercises General Exercises - Lower Extremity Ankle Circles/Pumps: AROM, Both, 10 reps, Seated    General Comments General comments (skin integrity, edema, etc.): +orthostatics. Reports mild dizziness. TED hose in place      Pertinent Vitals/Pain Pain Assessment Pain Assessment: Faces Faces Pain Scale: Hurts little more Pain Location: chronic low back pain Pain Descriptors / Indicators: Discomfort Pain Intervention(s): Monitored during session, Limited activity within patient's tolerance    Home Living                          Prior Function            PT Goals (current goals can now be found in the care plan section) Acute Rehab PT Goals Patient Stated Goal: home Progress  towards PT goals: Progressing toward goals    Frequency    Min 3X/week      PT Plan Current plan remains appropriate    Co-evaluation              AM-PAC PT "6 Clicks" Mobility   Outcome Measure  Help needed turning from your back to your side while in a flat bed without using bedrails?: None Help needed moving from lying on your back to sitting on the side of a flat bed without using bedrails?: A Little Help needed moving to and from a bed to a chair (including a wheelchair)?: A Little Help needed standing up from a chair using your arms (e.g., wheelchair or bedside chair)?: A Little Help needed to walk in hospital room?: A Little Help needed climbing 3-5 steps with a railing? : A Lot 6 Click Score: 18    End of Session Equipment Utilized During Treatment: Gait belt Activity Tolerance: Patient tolerated treatment well Patient left: in chair;with call bell/phone within reach;with chair alarm set Nurse Communication: Mobility status PT Visit Diagnosis: Unsteadiness on feet (R26.81);Muscle weakness (generalized) (M62.81)     Time: 1478-2956 PT Time Calculation (min) (ACUTE ONLY): 25 min  Charges:  $Gait Training: 8-22 mins $Therapeutic Activity: 8-22 mins                     Aida Raider, PT  Office # [phone] Pager [phone]    Ilda Foil 10/21/2021, 10:02 AM

## 2021-10-21 NOTE — Progress Notes (Signed)
Occupational Therapy Treatment Patient Details Name: Cassandra Rios MRN: [id number] DOB: [birth year] Today's Date: 10/21/2021   History of present illness Pt is a 71 y/o female admitted secondary to syncope and upper GI bleed. Pt is s/p endoscopy that showed gastric ulcers. PMH includes MS, HTN, AAA, COPD, and CAD.   OT comments  Focus of session on home safety/fall prevention, monitoring for symptoms of orthostatic hypotension and sitting as appropriate, benefits of rollator and use of toilet aids for pericare. Did not repeat blood pressures as PT had already recorded reading this morning. Pt with compression stockings donned.    Recommendations for follow up therapy are one component of a multi-disciplinary discharge planning process, led by the attending physician.  Recommendations may be updated based on patient status, additional functional criteria and insurance authorization.    Follow Up Recommendations  No OT follow up    Assistance Recommended at Discharge Intermittent Supervision/Assistance  Patient can return home with the following  A lot of help with bathing/dressing/bathroom;A little help with walking and/or transfers;Assist for transportation;Help with stairs or ramp for entrance   Equipment Recommendations  BSC/3in1 (bariatric)    Recommendations for Other Services      Precautions / Restrictions Precautions Precautions: Fall;Other (comment) Precaution Comments: +orthostatics, mild dizziness Restrictions Weight Bearing Restrictions: No       Mobility Bed Mobility               General bed mobility comments: in chair    Transfers Overall transfer level: Needs assistance Equipment used: Rolling walker (2 wheels) Transfers: Sit to/from Stand Sit to Stand: Supervision                 Balance Overall balance assessment: Needs assistance   Sitting balance-Leahy Scale: Good                                     ADL either  performed or assessed with clinical judgement   ADL                                         General ADL Comments: Educated pt to sit at sink during ADLs and to have sitting surfaces throughout her home should she become light headed. Pt plans to sit to shower, recommended pt shower only when husband is in home. Educated in use and availability of toilet aids. Pt is considering a rollator, the one she owns does not have brakes, cautioned pt not to use.    Extremity/Trunk Assessment              Vision       Perception     Praxis      Cognition Arousal/Alertness: Awake/alert Behavior During Therapy: WFL for tasks assessed/performed Overall Cognitive Status: Within Functional Limits for tasks assessed                                          Exercises      Shoulder Instructions       General Comments +orthostatics. Reports mild dizziness. TED hose in place    Pertinent Vitals/ Pain       Pain Assessment Pain Assessment: Faces Faces Pain Scale: Hurts a  little bit Pain Location: chronic low back pain, buttocks Pain Descriptors / Indicators: Discomfort Pain Intervention(s): Repositioned  Home Living                                          Prior Functioning/Environment              Frequency  Min 2X/week        Progress Toward Goals  OT Goals(current goals can now be found in the care plan section)  Progress towards OT goals: Progressing toward goals  Acute Rehab OT Goals OT Goal Formulation: With patient Time For Goal Achievement: 11/02/21 Potential to Achieve Goals: Good  Plan Discharge plan remains appropriate    Co-evaluation                 AM-PAC OT "6 Clicks" Daily Activity     Outcome Measure   Help from another person eating meals?: None Help from another person taking care of personal grooming?: A Little Help from another person toileting, which includes using toliet,  bedpan, or urinal?: A Lot Help from another person bathing (including washing, rinsing, drying)?: A Lot Help from another person to put on and taking off regular upper body clothing?: None Help from another person to put on and taking off regular lower body clothing?: A Little 6 Click Score: 18    End of Session    OT Visit Diagnosis: Other abnormalities of gait and mobility (R26.89)   Activity Tolerance Patient tolerated treatment well   Patient Left in chair;with call bell/phone within reach;with chair alarm set;with nursing/sitter in room   Nurse Communication Other (comment) (orthostatic with PT)        Time: 1610-9604 OT Time Calculation (min): 37 min  Charges: OT General Charges $OT Visit: 1 Visit OT Treatments $Self Care/Home Management : 23-37 mins Cassandra Rios, Cassandra Rios Acute Rehabilitation Services Pager: [phone] Office: [phone]  Evern Bio 10/21/2021, 12:50 PM

## 2021-10-21 NOTE — Discharge Summary (Addendum)
of the carotid and vertebral arteries. IMPRESSION: 1. No acute intracranial hemorrhage. 2. Atrophy with chronic microvascular ischemic changes. 3. Mild multilevel degenerative changes in the cervical spine without evidence of acute fracture. Electronically Signed   By: Brett Fairy M.D.   On: 10/17/2021 04:59   DG Chest Port 1 View  Result Date: 10/17/2021 CLINICAL DATA:  Hematemesis. EXAM: PORTABLE CHEST 1 VIEW COMPARISON:  04/21/2019. FINDINGS: The heart size and mediastinal contours are within normal limits. There is atherosclerotic calcification of the aorta. Lung volumes are low and elevation of the right diaphragm is noted. No consolidation, effusion, or pneumothorax. Mild apical pleural thickening on the right. No acute osseous abnormality. IMPRESSION: No active disease. Electronically Signed   By: Brett Fairy M.D.   On: 10/17/2021 04:07   MYOCARDIAL PERFUSION IMAGING  Result Date: 10/04/2021   The study is normal. The study is low risk.   No ST deviation was noted.   Left ventricular function is normal. Nuclear stress EF: 78 %. The left ventricular ejection fraction is hyperdynamic (>65%). End diastolic cavity size is normal.   Prior study available for comparison from 05/23/2016.  No changes compared to prior study. Low risk stress nuclear study with normal perfusion and normal left ventricular regional and global systolic function.  VAS [name] AAA DUPLEX  Result Date: 09/28/2021 ABDOMINAL AORTA STUDY Patient Name:  Cassandra Rios  Date of Exam:   09/28/2021 Medical Rec #: [id number]           Accession #:    [id number] Date of Birth: 10/26/1950           Patient Gender: F Patient Age:   60 years Exam Location:  [facility] Procedure:      VAS [name] AAA DUPLEX Referring Phys: Servando Snare --------------------------------------------------------------------------------  Indications: Follow up exam for known AAA. Risk Factors: Hypertension, hyperlipidemia. Limitations: Obesity.  Comparison Study: 03/23/2021: Largest aortic measurement 4.6 cm. Right CIA 1.8                   cm. Limited visualization of the left CIA. Performing Technologist: Ivan Croft  Examination Guidelines: A complete evaluation includes B-mode imaging, spectral Doppler, color Doppler, and power Doppler as needed of all accessible portions of each vessel. Bilateral testing is considered an integral part of a complete examination. Limited examinations for reoccurring indications may be performed as noted.  Abdominal Aorta Findings: +-----------+-------+----------+----------+--------+--------+--------+ Location   AP (cm)Trans (cm)PSV (cm/s)WaveformThrombusComments +-----------+-------+----------+----------+--------+--------+--------+ Proximal   2.70   2.59      60                                 +-----------+-------+----------+----------+--------+--------+--------+ Mid        4.30   4.16      32                                 +-----------+-------+----------+----------+--------+--------+--------+ Distal     4.52   4.58      76                                 +-----------+-------+----------+----------+--------+--------+--------+ RT CIA Prox1.6    1.9       148                                 +-----------+-------+----------+----------+--------+--------+--------+  PATIENT DETAILS Name: Cassandra Rios Age: 71 y.o. Sex: female Date of Birth: July 14, 1950 MRN: [id number]. Admitting Physician: Kristopher Oppenheim, DO ZSW:FUXNA Koren Shiver, DO  Admit Date: 10/17/2021 Discharge date: 10/21/2021  Recommendations for Outpatient Follow-up:  Follow up with PCP in 1-2 weeks Please obtain CMP/CBC in one week   Admitted From:  Home  Disposition: Home health   Discharge Condition: good  CODE STATUS:   Code Status: DNR   Diet recommendation:  Diet Order             Diet regular Room service appropriate? Yes; Fluid consistency: Thin  Diet effective now           Diet - low sodium heart healthy                    Brief Summary: Patient is a 71 y.o.  female with history of HTN, HLD, hypothyroidism, multiple sclerosis, COPD, AAA-presented with upper GI bleeding with acute blood loss anemia and episode of syncope.  See below for further details.    Significant events: 5/15>> admitted with syncope/melanotic stools-upper GI bleeding with acute blood loss anemia   Significant studies: 5/14>> CXR: No PNA 5/14>> CT head: No acute intracranial abnormality. 5/14>> CT C-spine: No fracture-multilevel degenerative changes     Significant microbiology data:     Procedures: 5/15>> EGD: Non bleeding gastric ulcers with pigmented material.   Consults: GI   Brief Hospital Course: Upper GI bleeding with acute blood loss anemia due to gastric ulcers: GI bleeding has resolved-hemoglobin stable-some fluctuation due to equilibration and IV fluid dilution (IV fluid restarted for orthostatic hypotension).  Had some melanotic stools-likely old blood coming out for the past few days-however yesterday-she had brown-colored stools.  She has not had a bowel movement since yesterday-H. pylori serology was negative.  GI followed closely-recommendations are to continue PPI twice daily until further notice.  No aspirin/NSAIDs.  Per Shirline Frees will arrange follow-up in  the office for repeat CBC.  Syncope: Likely orthostatic mechanism-telemetry negative-Echo with stable EF.    Orthostatic hypotension: Was symptomatic a few days back-but with IV fluid hydration-gradual overall improvement-she has improved significantly.  Per physical therapy today-very minimally symptomatic.  Stable for discharge-continue TED hose.    HTN: Resting BP on the higher side-allow some amount of permissive hypertension to prevent severe orthostatic hypotension/syncope.  Hold all antihypertensives on discharge-follow-up with PCP in 1 week to see if antihypertensives can be restarted.  HLD: Continue Lipitor.  Hypothyroidism: Continue Synthroid.   Mood disorder: Continue fluoxetine   AAA: Continue outpatient follow-up with vascular surgery   Multiple sclerosis: Interferon beta-1b resumed.  Obesity: Estimated body mass index is 37.93 kg/m as calculated from the following:   Height as of this encounter: 5' 6"  (1.676 m).   Weight as of this encounter: 106.6 kg.    Discharge Diagnoses:  Principal Problem:   UGI bleed Active Problems:   Hypothyroidism   Hyperlipidemia   MULTIPLE SCLEROSIS, RELAPSING/REMITTING   Class 2 obesity due to excess calories with body mass index (BMI) of 37.0 to 37.9 in adult   HTN (hypertension)   AAA (abdominal aortic aneurysm) (HCC)   Lichen sclerosus et atrophicus   Discharge Instructions:  Activity:  As tolerated with Full fall precautions use walker/cane & assistance as needed   Discharge Instructions     Call MD for:   Complete by: As directed    Black tarry stools or bloody stools.   Call MD for:  of the carotid and vertebral arteries. IMPRESSION: 1. No acute intracranial hemorrhage. 2. Atrophy with chronic microvascular ischemic changes. 3. Mild multilevel degenerative changes in the cervical spine without evidence of acute fracture. Electronically Signed   By: Brett Fairy M.D.   On: 10/17/2021 04:59   DG Chest Port 1 View  Result Date: 10/17/2021 CLINICAL DATA:  Hematemesis. EXAM: PORTABLE CHEST 1 VIEW COMPARISON:  04/21/2019. FINDINGS: The heart size and mediastinal contours are within normal limits. There is atherosclerotic calcification of the aorta. Lung volumes are low and elevation of the right diaphragm is noted. No consolidation, effusion, or pneumothorax. Mild apical pleural thickening on the right. No acute osseous abnormality. IMPRESSION: No active disease. Electronically Signed   By: Brett Fairy M.D.   On: 10/17/2021 04:07   MYOCARDIAL PERFUSION IMAGING  Result Date: 10/04/2021   The study is normal. The study is low risk.   No ST deviation was noted.   Left ventricular function is normal. Nuclear stress EF: 78 %. The left ventricular ejection fraction is hyperdynamic (>65%). End diastolic cavity size is normal.   Prior study available for comparison from 05/23/2016.  No changes compared to prior study. Low risk stress nuclear study with normal perfusion and normal left ventricular regional and global systolic function.  VAS [name] AAA DUPLEX  Result Date: 09/28/2021 ABDOMINAL AORTA STUDY Patient Name:  Cassandra Rios  Date of Exam:   09/28/2021 Medical Rec #: [id number]           Accession #:    [id number] Date of Birth: 10/26/1950           Patient Gender: F Patient Age:   60 years Exam Location:  [facility] Procedure:      VAS [name] AAA DUPLEX Referring Phys: Servando Snare --------------------------------------------------------------------------------  Indications: Follow up exam for known AAA. Risk Factors: Hypertension, hyperlipidemia. Limitations: Obesity.  Comparison Study: 03/23/2021: Largest aortic measurement 4.6 cm. Right CIA 1.8                   cm. Limited visualization of the left CIA. Performing Technologist: Ivan Croft  Examination Guidelines: A complete evaluation includes B-mode imaging, spectral Doppler, color Doppler, and power Doppler as needed of all accessible portions of each vessel. Bilateral testing is considered an integral part of a complete examination. Limited examinations for reoccurring indications may be performed as noted.  Abdominal Aorta Findings: +-----------+-------+----------+----------+--------+--------+--------+ Location   AP (cm)Trans (cm)PSV (cm/s)WaveformThrombusComments +-----------+-------+----------+----------+--------+--------+--------+ Proximal   2.70   2.59      60                                 +-----------+-------+----------+----------+--------+--------+--------+ Mid        4.30   4.16      32                                 +-----------+-------+----------+----------+--------+--------+--------+ Distal     4.52   4.58      76                                 +-----------+-------+----------+----------+--------+--------+--------+ RT CIA Prox1.6    1.9       148                                 +-----------+-------+----------+----------+--------+--------+--------+  PATIENT DETAILS Name: Cassandra Rios Age: 71 y.o. Sex: female Date of Birth: July 14, 1950 MRN: [id number]. Admitting Physician: Kristopher Oppenheim, DO ZSW:FUXNA Koren Shiver, DO  Admit Date: 10/17/2021 Discharge date: 10/21/2021  Recommendations for Outpatient Follow-up:  Follow up with PCP in 1-2 weeks Please obtain CMP/CBC in one week   Admitted From:  Home  Disposition: Home health   Discharge Condition: good  CODE STATUS:   Code Status: DNR   Diet recommendation:  Diet Order             Diet regular Room service appropriate? Yes; Fluid consistency: Thin  Diet effective now           Diet - low sodium heart healthy                    Brief Summary: Patient is a 71 y.o.  female with history of HTN, HLD, hypothyroidism, multiple sclerosis, COPD, AAA-presented with upper GI bleeding with acute blood loss anemia and episode of syncope.  See below for further details.    Significant events: 5/15>> admitted with syncope/melanotic stools-upper GI bleeding with acute blood loss anemia   Significant studies: 5/14>> CXR: No PNA 5/14>> CT head: No acute intracranial abnormality. 5/14>> CT C-spine: No fracture-multilevel degenerative changes     Significant microbiology data:     Procedures: 5/15>> EGD: Non bleeding gastric ulcers with pigmented material.   Consults: GI   Brief Hospital Course: Upper GI bleeding with acute blood loss anemia due to gastric ulcers: GI bleeding has resolved-hemoglobin stable-some fluctuation due to equilibration and IV fluid dilution (IV fluid restarted for orthostatic hypotension).  Had some melanotic stools-likely old blood coming out for the past few days-however yesterday-she had brown-colored stools.  She has not had a bowel movement since yesterday-H. pylori serology was negative.  GI followed closely-recommendations are to continue PPI twice daily until further notice.  No aspirin/NSAIDs.  Per Shirline Frees will arrange follow-up in  the office for repeat CBC.  Syncope: Likely orthostatic mechanism-telemetry negative-Echo with stable EF.    Orthostatic hypotension: Was symptomatic a few days back-but with IV fluid hydration-gradual overall improvement-she has improved significantly.  Per physical therapy today-very minimally symptomatic.  Stable for discharge-continue TED hose.    HTN: Resting BP on the higher side-allow some amount of permissive hypertension to prevent severe orthostatic hypotension/syncope.  Hold all antihypertensives on discharge-follow-up with PCP in 1 week to see if antihypertensives can be restarted.  HLD: Continue Lipitor.  Hypothyroidism: Continue Synthroid.   Mood disorder: Continue fluoxetine   AAA: Continue outpatient follow-up with vascular surgery   Multiple sclerosis: Interferon beta-1b resumed.  Obesity: Estimated body mass index is 37.93 kg/m as calculated from the following:   Height as of this encounter: 5' 6"  (1.676 m).   Weight as of this encounter: 106.6 kg.    Discharge Diagnoses:  Principal Problem:   UGI bleed Active Problems:   Hypothyroidism   Hyperlipidemia   MULTIPLE SCLEROSIS, RELAPSING/REMITTING   Class 2 obesity due to excess calories with body mass index (BMI) of 37.0 to 37.9 in adult   HTN (hypertension)   AAA (abdominal aortic aneurysm) (HCC)   Lichen sclerosus et atrophicus   Discharge Instructions:  Activity:  As tolerated with Full fall precautions use walker/cane & assistance as needed   Discharge Instructions     Call MD for:   Complete by: As directed    Black tarry stools or bloody stools.   Call MD for:  PATIENT DETAILS Name: Cassandra Rios Age: 71 y.o. Sex: female Date of Birth: July 14, 1950 MRN: [id number]. Admitting Physician: Kristopher Oppenheim, DO ZSW:FUXNA Koren Shiver, DO  Admit Date: 10/17/2021 Discharge date: 10/21/2021  Recommendations for Outpatient Follow-up:  Follow up with PCP in 1-2 weeks Please obtain CMP/CBC in one week   Admitted From:  Home  Disposition: Home health   Discharge Condition: good  CODE STATUS:   Code Status: DNR   Diet recommendation:  Diet Order             Diet regular Room service appropriate? Yes; Fluid consistency: Thin  Diet effective now           Diet - low sodium heart healthy                    Brief Summary: Patient is a 71 y.o.  female with history of HTN, HLD, hypothyroidism, multiple sclerosis, COPD, AAA-presented with upper GI bleeding with acute blood loss anemia and episode of syncope.  See below for further details.    Significant events: 5/15>> admitted with syncope/melanotic stools-upper GI bleeding with acute blood loss anemia   Significant studies: 5/14>> CXR: No PNA 5/14>> CT head: No acute intracranial abnormality. 5/14>> CT C-spine: No fracture-multilevel degenerative changes     Significant microbiology data:     Procedures: 5/15>> EGD: Non bleeding gastric ulcers with pigmented material.   Consults: GI   Brief Hospital Course: Upper GI bleeding with acute blood loss anemia due to gastric ulcers: GI bleeding has resolved-hemoglobin stable-some fluctuation due to equilibration and IV fluid dilution (IV fluid restarted for orthostatic hypotension).  Had some melanotic stools-likely old blood coming out for the past few days-however yesterday-she had brown-colored stools.  She has not had a bowel movement since yesterday-H. pylori serology was negative.  GI followed closely-recommendations are to continue PPI twice daily until further notice.  No aspirin/NSAIDs.  Per Shirline Frees will arrange follow-up in  the office for repeat CBC.  Syncope: Likely orthostatic mechanism-telemetry negative-Echo with stable EF.    Orthostatic hypotension: Was symptomatic a few days back-but with IV fluid hydration-gradual overall improvement-she has improved significantly.  Per physical therapy today-very minimally symptomatic.  Stable for discharge-continue TED hose.    HTN: Resting BP on the higher side-allow some amount of permissive hypertension to prevent severe orthostatic hypotension/syncope.  Hold all antihypertensives on discharge-follow-up with PCP in 1 week to see if antihypertensives can be restarted.  HLD: Continue Lipitor.  Hypothyroidism: Continue Synthroid.   Mood disorder: Continue fluoxetine   AAA: Continue outpatient follow-up with vascular surgery   Multiple sclerosis: Interferon beta-1b resumed.  Obesity: Estimated body mass index is 37.93 kg/m as calculated from the following:   Height as of this encounter: 5' 6"  (1.676 m).   Weight as of this encounter: 106.6 kg.    Discharge Diagnoses:  Principal Problem:   UGI bleed Active Problems:   Hypothyroidism   Hyperlipidemia   MULTIPLE SCLEROSIS, RELAPSING/REMITTING   Class 2 obesity due to excess calories with body mass index (BMI) of 37.0 to 37.9 in adult   HTN (hypertension)   AAA (abdominal aortic aneurysm) (HCC)   Lichen sclerosus et atrophicus   Discharge Instructions:  Activity:  As tolerated with Full fall precautions use walker/cane & assistance as needed   Discharge Instructions     Call MD for:   Complete by: As directed    Black tarry stools or bloody stools.   Call MD for:  PATIENT DETAILS Name: Cassandra Rios Age: 71 y.o. Sex: female Date of Birth: July 14, 1950 MRN: [id number]. Admitting Physician: Kristopher Oppenheim, DO ZSW:FUXNA Koren Shiver, DO  Admit Date: 10/17/2021 Discharge date: 10/21/2021  Recommendations for Outpatient Follow-up:  Follow up with PCP in 1-2 weeks Please obtain CMP/CBC in one week   Admitted From:  Home  Disposition: Home health   Discharge Condition: good  CODE STATUS:   Code Status: DNR   Diet recommendation:  Diet Order             Diet regular Room service appropriate? Yes; Fluid consistency: Thin  Diet effective now           Diet - low sodium heart healthy                    Brief Summary: Patient is a 71 y.o.  female with history of HTN, HLD, hypothyroidism, multiple sclerosis, COPD, AAA-presented with upper GI bleeding with acute blood loss anemia and episode of syncope.  See below for further details.    Significant events: 5/15>> admitted with syncope/melanotic stools-upper GI bleeding with acute blood loss anemia   Significant studies: 5/14>> CXR: No PNA 5/14>> CT head: No acute intracranial abnormality. 5/14>> CT C-spine: No fracture-multilevel degenerative changes     Significant microbiology data:     Procedures: 5/15>> EGD: Non bleeding gastric ulcers with pigmented material.   Consults: GI   Brief Hospital Course: Upper GI bleeding with acute blood loss anemia due to gastric ulcers: GI bleeding has resolved-hemoglobin stable-some fluctuation due to equilibration and IV fluid dilution (IV fluid restarted for orthostatic hypotension).  Had some melanotic stools-likely old blood coming out for the past few days-however yesterday-she had brown-colored stools.  She has not had a bowel movement since yesterday-H. pylori serology was negative.  GI followed closely-recommendations are to continue PPI twice daily until further notice.  No aspirin/NSAIDs.  Per Shirline Frees will arrange follow-up in  the office for repeat CBC.  Syncope: Likely orthostatic mechanism-telemetry negative-Echo with stable EF.    Orthostatic hypotension: Was symptomatic a few days back-but with IV fluid hydration-gradual overall improvement-she has improved significantly.  Per physical therapy today-very minimally symptomatic.  Stable for discharge-continue TED hose.    HTN: Resting BP on the higher side-allow some amount of permissive hypertension to prevent severe orthostatic hypotension/syncope.  Hold all antihypertensives on discharge-follow-up with PCP in 1 week to see if antihypertensives can be restarted.  HLD: Continue Lipitor.  Hypothyroidism: Continue Synthroid.   Mood disorder: Continue fluoxetine   AAA: Continue outpatient follow-up with vascular surgery   Multiple sclerosis: Interferon beta-1b resumed.  Obesity: Estimated body mass index is 37.93 kg/m as calculated from the following:   Height as of this encounter: 5' 6"  (1.676 m).   Weight as of this encounter: 106.6 kg.    Discharge Diagnoses:  Principal Problem:   UGI bleed Active Problems:   Hypothyroidism   Hyperlipidemia   MULTIPLE SCLEROSIS, RELAPSING/REMITTING   Class 2 obesity due to excess calories with body mass index (BMI) of 37.0 to 37.9 in adult   HTN (hypertension)   AAA (abdominal aortic aneurysm) (HCC)   Lichen sclerosus et atrophicus   Discharge Instructions:  Activity:  As tolerated with Full fall precautions use walker/cane & assistance as needed   Discharge Instructions     Call MD for:   Complete by: As directed    Black tarry stools or bloody stools.   Call MD for:  of the carotid and vertebral arteries. IMPRESSION: 1. No acute intracranial hemorrhage. 2. Atrophy with chronic microvascular ischemic changes. 3. Mild multilevel degenerative changes in the cervical spine without evidence of acute fracture. Electronically Signed   By: Brett Fairy M.D.   On: 10/17/2021 04:59   DG Chest Port 1 View  Result Date: 10/17/2021 CLINICAL DATA:  Hematemesis. EXAM: PORTABLE CHEST 1 VIEW COMPARISON:  04/21/2019. FINDINGS: The heart size and mediastinal contours are within normal limits. There is atherosclerotic calcification of the aorta. Lung volumes are low and elevation of the right diaphragm is noted. No consolidation, effusion, or pneumothorax. Mild apical pleural thickening on the right. No acute osseous abnormality. IMPRESSION: No active disease. Electronically Signed   By: Brett Fairy M.D.   On: 10/17/2021 04:07   MYOCARDIAL PERFUSION IMAGING  Result Date: 10/04/2021   The study is normal. The study is low risk.   No ST deviation was noted.   Left ventricular function is normal. Nuclear stress EF: 78 %. The left ventricular ejection fraction is hyperdynamic (>65%). End diastolic cavity size is normal.   Prior study available for comparison from 05/23/2016.  No changes compared to prior study. Low risk stress nuclear study with normal perfusion and normal left ventricular regional and global systolic function.  VAS [name] AAA DUPLEX  Result Date: 09/28/2021 ABDOMINAL AORTA STUDY Patient Name:  Cassandra Rios  Date of Exam:   09/28/2021 Medical Rec #: [id number]           Accession #:    [id number] Date of Birth: 10/26/1950           Patient Gender: F Patient Age:   60 years Exam Location:  [facility] Procedure:      VAS [name] AAA DUPLEX Referring Phys: Servando Snare --------------------------------------------------------------------------------  Indications: Follow up exam for known AAA. Risk Factors: Hypertension, hyperlipidemia. Limitations: Obesity.  Comparison Study: 03/23/2021: Largest aortic measurement 4.6 cm. Right CIA 1.8                   cm. Limited visualization of the left CIA. Performing Technologist: Ivan Croft  Examination Guidelines: A complete evaluation includes B-mode imaging, spectral Doppler, color Doppler, and power Doppler as needed of all accessible portions of each vessel. Bilateral testing is considered an integral part of a complete examination. Limited examinations for reoccurring indications may be performed as noted.  Abdominal Aorta Findings: +-----------+-------+----------+----------+--------+--------+--------+ Location   AP (cm)Trans (cm)PSV (cm/s)WaveformThrombusComments +-----------+-------+----------+----------+--------+--------+--------+ Proximal   2.70   2.59      60                                 +-----------+-------+----------+----------+--------+--------+--------+ Mid        4.30   4.16      32                                 +-----------+-------+----------+----------+--------+--------+--------+ Distal     4.52   4.58      76                                 +-----------+-------+----------+----------+--------+--------+--------+ RT CIA Prox1.6    1.9       148                                 +-----------+-------+----------+----------+--------+--------+--------+  PATIENT DETAILS Name: Cassandra Rios Age: 71 y.o. Sex: female Date of Birth: July 14, 1950 MRN: [id number]. Admitting Physician: Kristopher Oppenheim, DO ZSW:FUXNA Koren Shiver, DO  Admit Date: 10/17/2021 Discharge date: 10/21/2021  Recommendations for Outpatient Follow-up:  Follow up with PCP in 1-2 weeks Please obtain CMP/CBC in one week   Admitted From:  Home  Disposition: Home health   Discharge Condition: good  CODE STATUS:   Code Status: DNR   Diet recommendation:  Diet Order             Diet regular Room service appropriate? Yes; Fluid consistency: Thin  Diet effective now           Diet - low sodium heart healthy                    Brief Summary: Patient is a 71 y.o.  female with history of HTN, HLD, hypothyroidism, multiple sclerosis, COPD, AAA-presented with upper GI bleeding with acute blood loss anemia and episode of syncope.  See below for further details.    Significant events: 5/15>> admitted with syncope/melanotic stools-upper GI bleeding with acute blood loss anemia   Significant studies: 5/14>> CXR: No PNA 5/14>> CT head: No acute intracranial abnormality. 5/14>> CT C-spine: No fracture-multilevel degenerative changes     Significant microbiology data:     Procedures: 5/15>> EGD: Non bleeding gastric ulcers with pigmented material.   Consults: GI   Brief Hospital Course: Upper GI bleeding with acute blood loss anemia due to gastric ulcers: GI bleeding has resolved-hemoglobin stable-some fluctuation due to equilibration and IV fluid dilution (IV fluid restarted for orthostatic hypotension).  Had some melanotic stools-likely old blood coming out for the past few days-however yesterday-she had brown-colored stools.  She has not had a bowel movement since yesterday-H. pylori serology was negative.  GI followed closely-recommendations are to continue PPI twice daily until further notice.  No aspirin/NSAIDs.  Per Shirline Frees will arrange follow-up in  the office for repeat CBC.  Syncope: Likely orthostatic mechanism-telemetry negative-Echo with stable EF.    Orthostatic hypotension: Was symptomatic a few days back-but with IV fluid hydration-gradual overall improvement-she has improved significantly.  Per physical therapy today-very minimally symptomatic.  Stable for discharge-continue TED hose.    HTN: Resting BP on the higher side-allow some amount of permissive hypertension to prevent severe orthostatic hypotension/syncope.  Hold all antihypertensives on discharge-follow-up with PCP in 1 week to see if antihypertensives can be restarted.  HLD: Continue Lipitor.  Hypothyroidism: Continue Synthroid.   Mood disorder: Continue fluoxetine   AAA: Continue outpatient follow-up with vascular surgery   Multiple sclerosis: Interferon beta-1b resumed.  Obesity: Estimated body mass index is 37.93 kg/m as calculated from the following:   Height as of this encounter: 5' 6"  (1.676 m).   Weight as of this encounter: 106.6 kg.    Discharge Diagnoses:  Principal Problem:   UGI bleed Active Problems:   Hypothyroidism   Hyperlipidemia   MULTIPLE SCLEROSIS, RELAPSING/REMITTING   Class 2 obesity due to excess calories with body mass index (BMI) of 37.0 to 37.9 in adult   HTN (hypertension)   AAA (abdominal aortic aneurysm) (HCC)   Lichen sclerosus et atrophicus   Discharge Instructions:  Activity:  As tolerated with Full fall precautions use walker/cane & assistance as needed   Discharge Instructions     Call MD for:   Complete by: As directed    Black tarry stools or bloody stools.   Call MD for:  LT CIA Prox1.6    1.8       112                                +-----------+-------+----------+----------+--------+--------+--------+  Summary: Abdominal Aorta: There is evidence of abnormal dilatation of the mid and distal Abdominal aorta. The largest aortic measurement is 4.6 cm. The largest aortic diameter remains essentially unchanged compared to prior exam. Previous diameter measurement was  4.6 cm obtained on 03/23/2021.  *See table(s) above for measurements and observations.  Electronically signed by Servando Snare MD on 09/28/2021 at 1:35:55 PM.    Final    ECHOCARDIOGRAM COMPLETE  Result Date: 10/18/2021    ECHOCARDIOGRAM REPORT   Patient Name:   Cassandra Rios Date of Exam: 10/18/2021 Medical Rec #:  [id number]          Height:       66.0 in Accession #:    [id number]         Weight:       235.0 lb Date of Birth:  [birth year]          BSA:          2.142 m Patient Age:    82 years           BP:           117/55 mmHg Patient Gender: F                  HR:           61 bpm. Exam Location:  Inpatient Procedure: 2D Echo, Cardiac Doppler and Color Doppler Indications:    R55 Syncope  History:        Patient has no prior history of Echocardiogram examinations.                 COPD; Risk Factors:Hypertension and Dyslipidemia.  Sonographer:    Bernadene Person RDCS Referring Phys: [place]  1. Left ventricular ejection fraction, by estimation, is 70 to 75%. The left ventricle has hyperdynamic function. The left ventricle has no regional wall motion abnormalities. Left ventricular diastolic parameters are consistent with Grade II diastolic dysfunction (pseudonormalization). Elevated left atrial pressure.  2. Right ventricular systolic function is normal. The right ventricular size is normal.  3. The mitral valve is normal in structure. No evidence of mitral valve regurgitation. No evidence of  mitral stenosis.  4. The aortic valve is normal in structure. Aortic valve regurgitation is not visualized. No aortic stenosis is present.  5. The inferior vena cava is normal in size with <50% respiratory variability, suggesting right atrial pressure of 8 mmHg. FINDINGS  Left Ventricle: Left ventricular ejection fraction, by estimation, is 70 to 75%. The left ventricle has hyperdynamic function. The left ventricle has no regional wall motion abnormalities. The left ventricular internal cavity size was normal in size. There is no left ventricular hypertrophy. Left ventricular diastolic parameters are consistent with Grade II diastolic dysfunction (pseudonormalization). Elevated left atrial pressure. Right Ventricle: The right ventricular size is normal. No increase in right ventricular wall thickness. Right ventricular systolic function is normal. Left Atrium: Left atrial size was normal in size. Right Atrium: Right atrial size was normal in size. Pericardium: There is no evidence of pericardial effusion. Mitral Valve: The mitral valve is normal in structure. No evidence of mitral valve regurgitation. No evidence of mitral valve stenosis. Tricuspid Valve: The

## 2021-10-21 NOTE — TOC Transition Note (Addendum)
Transition of Care [facility]) - CM/SW Discharge Note   Patient Details  Name: Cassandra Rios MRN: [id number] Date of Birth: Mar 18, 1951  [facility] CM/SW Contact:  Carles Collet, RN Phone Number: 10/21/2021, 11:04 AM   Clinical Narrative:   Dorene Grebe of DC today, and [employer] will deliver DME to room prior to DC today. Spoke w patient and she would like a rollator delivered to her house. Order placed and notified [employer] that she would like to discuss the cost with them as well.       Barriers to Discharge: Continued Medical Work up   Patient Goals and CMS Choice Patient states their goals for this hospitalization and ongoing recovery are:: return home CMS Medicare.gov Compare Post Acute Care list provided to:: Patient Choice offered to / list presented to : Patient  Discharge Placement                       Discharge Plan and Services   Discharge Planning Services: CM Consult Post Acute Care Choice: Home Health          DME Arranged: 3-N-1 DME Agency: [employer] Date DME Agency Contacted: 10/19/21 Time DME Agency Contacted: [phone] Representative spoke with at DME Agency: [place]: PT, OT [place] Agency: [place] Date [place]: 10/19/21 Time [place]: [phone] Representative spoke with at [place]: Amy  Social Determinants of Health ([place]) Interventions     Readmission Risk Interventions     View : No data to display.

## 2021-10-23 DIAGNOSIS — R55 Syncope and collapse: Secondary | ICD-10-CM | POA: Diagnosis not present

## 2021-10-24 ENCOUNTER — Telehealth: Payer: Self-pay

## 2021-10-24 ENCOUNTER — Telehealth: Payer: Self-pay | Admitting: Interventional Cardiology

## 2021-10-24 NOTE — Telephone Encounter (Signed)
Transition Care Management Follow-up Telephone Call Date of discharge and from where: [facility] 10-21-21 Dx:  Upper GI bleed How have you been since you were released from the hospital? Feeling some better  Any questions or concerns? No  Items Reviewed: Did the pt receive and understand the discharge instructions provided? Yes  Medications obtained and verified? Yes  Other? No  Any new allergies since your discharge? No  Dietary orders reviewed? Yes Do you have support at home? Yes   Home Care and Equipment/Supplies: Were home health services ordered? yes If so, what is the name of the agency? [facility] care systems   Has the agency set up a time to come to the patient's home? no Were any new equipment or medical supplies ordered?  Yes: BSC - rollator What is the name of the medical supply agency? [employer]  Were you able to get the supplies/equipment? yes Do you have any questions related to the use of the equipment or supplies? No  Functional Questionnaire: (I = Independent and D = Dependent) ADLs: I  Bathing/Dressing- I  Meal Prep- I  Eating- I  Maintaining continence- I  Transferring/Ambulation- I  Managing Meds- I  Follow up appointments reviewed:  PCP Hospital f/u appt confirmed? Yes  Scheduled to see Dr Carollee Herter on 10-25-21 @ [facility] f/u appt confirmed? No . Are transportation arrangements needed? Yes- Dr Watt Climes 12-05-21 at 3pm- Dr Tamala Julian on 11-25-21 at 845am If their condition worsens, is the pt aware to call PCP or go to the Emergency Dept.? Yes Was the patient provided with contact information for the PCP's office or ED? Yes Was to pt encouraged to call back with questions or concerns? Yes

## 2021-10-24 NOTE — Telephone Encounter (Signed)
Pt scheduled for tomorrow

## 2021-10-24 NOTE — Telephone Encounter (Signed)
Spoke with patient about echo from hospitalization.  Patient asked why was this performed and what did it show. Reviewed hospital summary, explained to patient it appears the echo was performed due to her syncope (to check heart pumping function/valve function) and that the comment was that the echo showed stable EF.  Patient requested Dr. Tamala Julian review this echo. Forwarded to Dr. Tamala Julian for review.  Patient expressed appreciation for call.

## 2021-10-24 NOTE — Telephone Encounter (Signed)
Patient is requesting to have someone review her echo results and discuss with her if possible. She had the echo in the hospital and states no one went over it with her.

## 2021-10-24 NOTE — Telephone Encounter (Signed)
Caller Name Story Phone Number [phone] Patient Name Cassandra Rios [name] Patient DOB January 07, 1951 Call Type Message Only Information Provided Reason for Call Request to Schedule Office Appointment Initial Comment Caller states she was just discharged from the hospital yesterday (due to bleeding ulcers.) State she was told to make an appt here within a week of her discharge date (10/21/2021.) Says she needs to get some labs done as soon as possible. States she had an appt in June but her platelets are so low so she needs to come in asap. States since she has health care at home she said if the doctor could call her she could possibly set her up to get her lab work done somewhere close to where she lives. Patient request to speak to RN No Additional Comment Caller would like to get a call first thing Monday morning to try to get set up an appt this week as soon as possible. But again would like to get labs done somewhere near her house. States they took her off of her bp because her bp was bottoming out so bad. States they added a iron pill and errous-sulfate pill. Also added protonic's for her ulcers. STATES SHE WOULD LIKE TO TRY TO SET UP A TELEMED MONDAY. States the she wanted to give the doc a copy of all her papers from when she was in the hospital. States to call anytime at the earliest as possible. Disp. Time Disposition Final User 10/22/2021 8:57:32 AM General Information Provided Yes Vena Rua Call Closed By: Vena Rua Transaction Date/Time: 10/22/2021 8:46:42 AM (ET)

## 2021-10-24 NOTE — Telephone Encounter (Signed)
Spoke with patient to discuss Dr. Thompson Caul review of echo.  Per Dr. Tamala Julian: The heart is stiff with elevated pressure in lungs. Is she short of breath? If so start SCLT-2, Bmet in 2 weeks after starting [place] or Farxiga and report on breathing in 2-4 weeks.  Patient states she has some shortness of breath with activity. She wrote down the names of Vania Rea and Wilder Glade and states she will research them and ask about them at her appt with Richardson Dopp on June 23rd.  Patient expressed appreciation for Dr. Tamala Julian reviewing her echo and the prompt replies.

## 2021-10-24 NOTE — Telephone Encounter (Signed)
-----   Message from Belva Crome, MD sent at 10/24/2021 11:38 AM EDT ----- The heart is stiff with elevated pressure in lungs. Is she short of breath? If so start SCLT-2, Bmet in 2 weeks after starting [place] or Farxiga and report on breathing in 2-4 weeks. ----- Message ----- From: Molli Barrows Sent: 10/24/2021  11:08 AM EDT To: Belva Crome, MD  For your review per patient request. This was from her last hospitalization on 10/17/21 for syncope/upper GI bleed.

## 2021-10-25 ENCOUNTER — Encounter: Payer: Self-pay | Admitting: Family Medicine

## 2021-10-25 ENCOUNTER — Telehealth: Payer: Self-pay

## 2021-10-25 ENCOUNTER — Telehealth (INDEPENDENT_AMBULATORY_CARE_PROVIDER_SITE_OTHER): Payer: PPO | Admitting: Family Medicine

## 2021-10-25 VITALS — BP 120/68 | HR 80

## 2021-10-25 DIAGNOSIS — E039 Hypothyroidism, unspecified: Secondary | ICD-10-CM | POA: Diagnosis not present

## 2021-10-25 DIAGNOSIS — I714 Abdominal aortic aneurysm, without rupture, unspecified: Secondary | ICD-10-CM | POA: Diagnosis not present

## 2021-10-25 DIAGNOSIS — Z6837 Body mass index (BMI) 37.0-37.9, adult: Secondary | ICD-10-CM | POA: Diagnosis not present

## 2021-10-25 DIAGNOSIS — G35 Multiple sclerosis: Secondary | ICD-10-CM | POA: Diagnosis not present

## 2021-10-25 DIAGNOSIS — K254 Chronic or unspecified gastric ulcer with hemorrhage: Secondary | ICD-10-CM | POA: Diagnosis not present

## 2021-10-25 DIAGNOSIS — I10 Essential (primary) hypertension: Secondary | ICD-10-CM | POA: Diagnosis not present

## 2021-10-25 DIAGNOSIS — K922 Gastrointestinal hemorrhage, unspecified: Secondary | ICD-10-CM | POA: Diagnosis not present

## 2021-10-25 DIAGNOSIS — L900 Lichen sclerosus et atrophicus: Secondary | ICD-10-CM | POA: Diagnosis not present

## 2021-10-25 DIAGNOSIS — E669 Obesity, unspecified: Secondary | ICD-10-CM | POA: Diagnosis not present

## 2021-10-25 DIAGNOSIS — M6281 Muscle weakness (generalized): Secondary | ICD-10-CM | POA: Diagnosis not present

## 2021-10-25 DIAGNOSIS — E785 Hyperlipidemia, unspecified: Secondary | ICD-10-CM | POA: Diagnosis not present

## 2021-10-25 NOTE — Progress Notes (Signed)
Virtual telephone visit    Virtual Visit via Telephone Note   This visit type was conducted due to national recommendations for restrictions regarding the COVID-19 Pandemic (e.g. social distancing) in an effort to limit this patient's exposure and mitigate transmission in our community. Due to her co-morbid illnesses, this patient is at least at moderate risk for complications without adequate follow up. This format is felt to be most appropriate for this patient at this time. The patient did not have access to video technology or had technical difficulties with video requiring transitioning to audio format only (telephone). Physical exam was limited to content and character of the telephone converstion. Herbert Seta  was able to get the patient set up on a telephone visit.   Patient location: home  Patient and provider in visit Provider location: Office  I discussed the limitations of evaluation and management by telemedicine and the availability of in person appointments. The patient expressed understanding and agreed to proceed.   Visit Date: 10/25/2021  Today's healthcare provider: [name], DO     Subjective:    Patient ID: Cassandra Rios, female    DOB: 17-Apr-1951, 71 y.o.   MRN: [id number]  Chief Complaint  Patient presents with   Hospitalization Follow-up    Upper GI bleed    HPI Patient is in today for hosp f/u for gi bleed ----  5/15-5/19   pt had a syncopal episode and ems brought her to the er.  She was admitted and gi did a egd---- + gastic  ulcer---- not bleeding at the time   pt d/c home with stable hgb- and dec pain.  She is taking protonix daily   Past Medical History:  Diagnosis Date   AAA (abdominal aortic aneurysm) (HCC)    Allergic rhinitis    Chronic hoarseness    Colon polyps    COPD (chronic obstructive pulmonary disease) (HCC)    Dr. Marchelle Gearing   Dyslipidemia    Hypertension    Hypothyroidism    MS (multiple sclerosis) (HCC)     Obesity    Stomach ulcer from aspirin/ibuprofen-like drugs (NSAID's)     Past Surgical History:  Procedure Laterality Date   ABDOMINAL HYSTERECTOMY     BARIATRIC SURGERY  09/07/08   lap band   CARPAL TUNNEL RELEASE     CATARACT EXTRACTION Right 11/08/2017   CATARACT EXTRACTION Left 11/22/2017   CATARACT EXTRACTION, BILATERAL      Toric lenses for astigmatism   CHOLECYSTECTOMY     CHOLECYSTECTOMY     ESOPHAGOGASTRODUODENOSCOPY (EGD) WITH PROPOFOL N/A 10/17/2021   Procedure: ESOPHAGOGASTRODUODENOSCOPY (EGD) WITH PROPOFOL;  Surgeon: Willis Modena, MD;  Location: [facility] ENDOSCOPY;  Service: Gastroenterology;  Laterality: N/A;   HIATAL HERNIA REPAIR  09/07/08   LEFT HEART CATH AND CORONARY ANGIOGRAPHY N/A 07/09/2019   Procedure: LEFT HEART CATH AND CORONARY ANGIOGRAPHY;  Surgeon: Lyn Records, MD;  Location: [facility] INVASIVE CV LAB;  Service: Cardiovascular;  Laterality: N/A;    Family History  Problem Relation Age of Onset   Stroke Mother 36   Heart disease Mother        congenital heart defect?   COPD Father    Heart disease Father    AAA (abdominal aortic aneurysm) Father    COPD Sister    Arthritis Sister    AAA (abdominal aortic aneurysm) Sister    Dementia Sister    Other Sister        brain injury   Multiple  sclerosis Neg Hx     Social History   Socioeconomic History   Marital status: Married    Spouse name: Casimiro Needle   Number of children: 2   Years of education: 16   Highest education level: Not on file  Occupational History   Occupation: retired  Tobacco Use   Smoking status: Former    Packs/day: 2.00    Years: 40.00    Pack years: 80.00    Types: Cigarettes    Quit date: 04/05/2007    Years since quitting: 14.5    Passive exposure: Current (Husband)   Smokeless tobacco: Never  Vaping Use   Vaping Use: Not on file  Substance and Sexual Activity   Alcohol use: No   Drug use: No   Sexual activity: Never    Partners: Male  Other Topics Concern   Not on file   Social History Narrative   Patient is right handed,reside in home with husband   Social Determinants of Health   Financial Resource Strain: Not on file  Food Insecurity: Not on file  Transportation Needs: Not on file  Physical Activity: Not on file  Stress: Not on file  Social Connections: Not on file  Intimate Partner Violence: Not on file    Outpatient Medications Prior to Visit  Medication Sig Dispense Refill   acetaminophen (TYLENOL) 500 MG tablet Take 1,000 mg by mouth every 6 (six) hours as needed for moderate pain or headache.     atorvastatin (LIPITOR) 80 MG tablet Take 1 tablet (80 mg total) by mouth daily. 90 tablet 3   calcium carbonate (TUMS - DOSED IN MG ELEMENTAL CALCIUM) 500 MG chewable tablet Chew 2 tablets by mouth daily as needed for indigestion or heartburn.     ferrous sulfate 325 (65 FE) MG EC tablet Take 1 tablet (325 mg total) by mouth 2 (two) times daily. 60 tablet 0   FLUoxetine (PROZAC) 40 MG capsule Take 1 capsule (40 mg total) by mouth daily. 90 capsule 3   gabapentin (NEURONTIN) 300 MG capsule Take 600 mg by mouth every evening.      HYDROcodone-acetaminophen (NORCO/VICODIN) 5-325 MG per tablet Take 0.5 tablets by mouth 2 (two) times daily as needed for moderate pain.      Interferon Beta-1b (BETASERON/EXTAVIA) 0.3 MG KIT injection Inject 0.25 mg into the skin every other day.     levocetirizine (XYZAL) 5 MG tablet Take 1 tablet (5 mg total) by mouth every evening. (Patient taking differently: Take 5 mg by mouth daily as needed for allergies.) 30 tablet 5   levothyroxine (SYNTHROID) 112 MCG tablet TAKE 1 TABLET BY MOUTH EVERY DAY BEFORE BREAKFAST (Patient taking differently: Take 112 mcg by mouth daily before breakfast.) 90 tablet 1   mometasone (NASONEX) 50 MCG/ACT nasal spray Place 2 sprays into the nose daily as needed (allergies).     nitroGLYCERIN (NITROSTAT) 0.4 MG SL tablet Place 1 tablet (0.4 mg total) under the tongue every 5 (five) minutes as needed  for chest pain. 30 tablet 3   pantoprazole (PROTONIX) 40 MG tablet Take 1 tablet (40 mg total) by mouth 2 (two) times daily before a meal. 60 tablet 3   Polyethyl Glycol-Propyl Glycol (SYSTANE ULTRA OP) Place 1 drop into the right eye daily as needed (dry eye).      silver sulfADIAZINE (SILVADENE) 1 % cream Apply 1 application. topically daily as needed (irritated areas under abdomen).     triamcinolone ointment (KENALOG) 0.1 % Apply 1 application. topically daily  as needed (rash).     No facility-administered medications prior to visit.    Allergies  Allergen Reactions   Codeine Itching and Nausea And Vomiting   Hydromorphone Hcl Nausea And Vomiting   Ibuprofen Other (See Comments)    stomach ulcers   Morphine Sulfate     "makes my blood pressure bottom out"   Metronidazole Hives, Nausea And Vomiting and Rash    Review of Systems  Constitutional:  Negative for fever and malaise/fatigue.  HENT:  Negative for congestion.   Eyes:  Negative for blurred vision.  Respiratory:  Negative for cough and shortness of breath.   Cardiovascular:  Negative for chest pain, palpitations and leg swelling.  Gastrointestinal:  Negative for vomiting.  Musculoskeletal:  Negative for back pain.  Skin:  Negative for rash.  Neurological:  Negative for loss of consciousness and headaches.      Objective:    Physical Exam Vitals and nursing note reviewed.  Pulmonary:     Effort: Pulmonary effort is normal.  Abdominal:     Tenderness: There is no abdominal tenderness. There is no guarding or rebound.  Neurological:     General: No focal deficit present.  Psychiatric:        Mood and Affect: Mood normal.        Behavior: Behavior normal.        Thought Content: Thought content normal.        Judgment: Judgment normal.    There were no vitals taken for this visit. Wt Readings from Last 3 Encounters:  10/17/21 235 lb (106.6 kg)  10/03/21 235 lb (106.6 kg)  09/28/21 233 lb 8 oz (105.9 kg)     Diabetic Foot Exam - Simple   No data filed    Lab Results  Component Value Date   WBC 7.4 10/21/2021   HGB 7.9 (L) 10/21/2021   HCT 23.6 (L) 10/21/2021   PLT 107 (L) 10/21/2021   GLUCOSE 98 10/18/2021   CHOL 142 05/24/2021   TRIG 145.0 05/24/2021   HDL 42.00 05/24/2021   LDLCALC 71 05/24/2021   ALT 22 10/17/2021   AST 26 10/17/2021   NA 136 10/18/2021   K 4.2 10/18/2021   CL 108 10/18/2021   CREATININE 0.95 10/18/2021   BUN 32 (H) 10/18/2021   CO2 22 10/18/2021   TSH 1.04 05/24/2021   INR 1.1 10/17/2021   HGBA1C 5.9 09/25/2016   MICROALBUR 2.5 (H) 09/15/2014    Lab Results  Component Value Date   TSH 1.04 05/24/2021   Lab Results  Component Value Date   WBC 7.4 10/21/2021   HGB 7.9 (L) 10/21/2021   HCT 23.6 (L) 10/21/2021   MCV 90.8 10/21/2021   PLT 107 (L) 10/21/2021   Lab Results  Component Value Date   NA 136 10/18/2021   K 4.2 10/18/2021   CO2 22 10/18/2021   GLUCOSE 98 10/18/2021   BUN 32 (H) 10/18/2021   CREATININE 0.95 10/18/2021   BILITOT 0.9 10/17/2021   ALKPHOS 60 10/17/2021   AST 26 10/17/2021   ALT 22 10/17/2021   PROT 5.8 (L) 10/17/2021   ALBUMIN 2.9 (L) 10/17/2021   CALCIUM 8.3 (L) 10/18/2021   ANIONGAP 6 10/18/2021   GFR 76.48 05/24/2021   Lab Results  Component Value Date   CHOL 142 05/24/2021   Lab Results  Component Value Date   HDL 42.00 05/24/2021   Lab Results  Component Value Date   LDLCALC 71 05/24/2021   Lab Results  Component Value Date   TRIG 145.0 05/24/2021   Lab Results  Component Value Date   CHOLHDL 3 05/24/2021   Lab Results  Component Value Date   HGBA1C 5.9 09/25/2016       Assessment & Plan:   Problem List Items Addressed This Visit       Unprioritized   Gastrointestinal hemorrhage associated with gastric ulcer - Primary    con't protonix  F/u GI  Check labs        Relevant Orders   Ambulatory referral to Home Health    I am having Tery Sanfilippo. [place]pock maintain her  HYDROcodone-acetaminophen, gabapentin, Polyethyl Glycol-Propyl Glycol (SYSTANE ULTRA OP), acetaminophen, calcium carbonate, Interferon Beta-1b, mometasone, silver sulfADIAZINE, triamcinolone ointment, FLUoxetine, levothyroxine, levocetirizine, atorvastatin, nitroGLYCERIN, pantoprazole, and ferrous sulfate.  No orders of the defined types were placed in this encounter.    I discussed the assessment and treatment plan with the patient. The patient was provided an opportunity to ask questions and all were answered. The patient agreed with the plan and demonstrated an understanding of the instructions.   The patient was advised to call back or seek an in-person evaluation if the symptoms worsen or if the condition fails to improve as anticipated.  I provided 25 minutes of non-face-to-face time during this encounter.   [name], DO [facility] [employer] [phone] (phone) [phone] (fax)  [facility]

## 2021-10-25 NOTE — Telephone Encounter (Signed)
Spoke w/ [place]- verbal orders given for PT.

## 2021-10-25 NOTE — Patient Instructions (Signed)
Gastrointestinal Bleeding Gastrointestinal (GI) bleeding is bleeding somewhere along the digestive tract, between the mouth and the anus. The digestive tract includes the mouth, esophagus, stomach, small intestine, large intestine, and anus. The large intestine is often called the colon. GI bleeding can be caused by various problems. The severity of these problems can be mild, serious, or life-threatening. If you have GI bleeding, you may find blood in your stools (feces), you may have black stools, or you may vomit blood. You may need to stay in the hospital if there is a lot of bleeding. What are the causes? This condition may be caused by: Inflammation, irritation, or swelling of the esophagus (esophagitis). The esophagus is part of the body that moves food from your mouth to your stomach. Swollen veins in the rectum (hemorrhoids). Tears in the anus (anal fissures). The tears are often caused by passing hard stool. Pouches that form on the colon and may bleed (diverticulosis). Inflammation in areas with diverticulosis. This is called diverticulitis.This can cause pain, fever, and bloody stools. Growths (polyps) or cancer. Colon cancer often starts out as precancerous polyps. Gastritis and ulcers. These may cause bleeding in the upper GI tract, near the stomach. What increases the risk? You are more likely to develop this condition if: You have an infection in your stomach from a type of bacteria called Helicobacter pylori. You take certain medicines, such as: NSAIDs. Aspirin. Selective serotonin reuptake inhibitors (SSRIs). Steroids. Antiplatelet or anticoagulant medicines. You smoke. You drink alcohol. What are the signs or symptoms? Common symptoms of this condition include: Bright red blood in your vomit, or vomit that looks like coffee grounds. Bloody, black, or tarry stools. Bleeding from the lower GI tract will usually cause red or maroon blood in the stools. Bleeding from the  upper GI tract may cause black, tarry stools that are often stronger smelling than usual. In certain cases, if the bleeding is fast enough, the stools may be red. Pain or cramping in the abdomen. How is this diagnosed? This condition may be diagnosed based on: Your medical history and a physical exam. Various tests, such as: Blood tests. Stool tests. X-rays and other imaging tests. Esophagogastroduodenoscopy (EGD). In this test, a flexible, lighted tube is used to look at your esophagus, stomach, and small intestine. Colonoscopy. In this test, a flexible, lighted tube is used to look at your colon. How is this treated? Treatment for this condition depends on the cause of the bleeding. For example: For bleeding from the esophagus, stomach, small intestine, or colon, the health care provider may do a procedure to stop bleeding during your EGD or colonoscopy. Inflammation or infection of the colon can be treated with medicines. Certain rectal problems can be treated with creams, suppositories, or warm baths. Medicines may be given to reduce acid in your stomach. Surgery is sometimes done. Blood transfusions are sometimes needed if a lot of blood has been lost. If there is a lot of bleeding, you will need to stay in the hospital for observation. If bleeding is mild, you may be allowed to go home. Follow these instructions at home:  Take over-the-counter and prescription medicines only as told by your health care provider. Eat foods that are high in fiber, such as beans, whole grains, and fresh fruits and vegetables. This will help to keep your stools soft. Eating 1-3 prunes each day works well for many people. Drink enough fluid to keep your urine pale yellow. Keep all follow-up visits. This is important. Contact a   health care provider if: Your symptoms do not improve with treatment. Get help right away if: Your bleeding does not stop. You feel light-headed or you faint. You feel  weak. You have severe cramps in your back or abdomen. You pass large blood clots in your stool. Your symptoms are getting worse. You have chest pain or fast heartbeats. These symptoms may be an emergency. Get help right away. Call 911. Do not wait to see if the symptoms will go away. Do not drive yourself to the hospital. Summary Gastrointestinal (GI) bleeding is bleeding somewhere along the digestive tract, between the mouth and anus. GI bleeding can be caused by various problems. Treatment for this condition depends on the cause of the bleeding. Take over-the-counter and prescription medicines only as told by your health care provider. Get help right away if your bleeding increases, your symptoms are getting worse, or you have new symptoms. Keep all follow-up visits. This is important. This information is not intended to replace advice given to you by your health care provider. Make sure you discuss any questions you have with your health care provider. Document Revised: 12/24/2020 Document Reviewed: 12/24/2020 Elsevier Patient Education  2023 Elsevier Inc.  

## 2021-10-25 NOTE — Assessment & Plan Note (Signed)
con't protonix F/u GI Check labs 

## 2021-11-01 ENCOUNTER — Telehealth: Payer: Self-pay | Admitting: Family Medicine

## 2021-11-01 NOTE — Telephone Encounter (Signed)
They would have to have another discipline other than nursing to do the home health referral and also they probably will not get out until next week to draw labs.  Do you have another discipline to add?

## 2021-11-01 NOTE — Telephone Encounter (Signed)
Referral from 10/25/21 updated can you resend over to [facility]?

## 2021-11-01 NOTE — Addendum Note (Signed)
Addended by: Roma Schanz R on: 11/01/2021 03:47 PM   Modules accepted: Orders

## 2021-11-01 NOTE — Telephone Encounter (Signed)
Pt called stating that the [facility] agency has not received orders for an at-home blood draw for labs. Per her appt on 5.23.23, Dr. Etter Sjogren said she would send in those orders. Pt would like a call back to go over info if possible.

## 2021-11-01 NOTE — Telephone Encounter (Signed)
Could you place appropriate orders please?

## 2021-11-03 DIAGNOSIS — I714 Abdominal aortic aneurysm, without rupture, unspecified: Secondary | ICD-10-CM | POA: Diagnosis not present

## 2021-11-03 DIAGNOSIS — L900 Lichen sclerosus et atrophicus: Secondary | ICD-10-CM | POA: Diagnosis not present

## 2021-11-03 DIAGNOSIS — E669 Obesity, unspecified: Secondary | ICD-10-CM | POA: Diagnosis not present

## 2021-11-03 DIAGNOSIS — E785 Hyperlipidemia, unspecified: Secondary | ICD-10-CM | POA: Diagnosis not present

## 2021-11-03 DIAGNOSIS — M6281 Muscle weakness (generalized): Secondary | ICD-10-CM | POA: Diagnosis not present

## 2021-11-03 DIAGNOSIS — G35 Multiple sclerosis: Secondary | ICD-10-CM | POA: Diagnosis not present

## 2021-11-03 DIAGNOSIS — Z6837 Body mass index (BMI) 37.0-37.9, adult: Secondary | ICD-10-CM | POA: Diagnosis not present

## 2021-11-03 DIAGNOSIS — K922 Gastrointestinal hemorrhage, unspecified: Secondary | ICD-10-CM | POA: Diagnosis not present

## 2021-11-03 DIAGNOSIS — E039 Hypothyroidism, unspecified: Secondary | ICD-10-CM | POA: Diagnosis not present

## 2021-11-03 DIAGNOSIS — I10 Essential (primary) hypertension: Secondary | ICD-10-CM | POA: Diagnosis not present

## 2021-11-07 ENCOUNTER — Telehealth: Payer: Self-pay | Admitting: Family Medicine

## 2021-11-07 ENCOUNTER — Other Ambulatory Visit: Payer: Self-pay | Admitting: Family Medicine

## 2021-11-07 DIAGNOSIS — I10 Essential (primary) hypertension: Secondary | ICD-10-CM

## 2021-11-07 MED ORDER — LOSARTAN POTASSIUM 50 MG PO TABS: 50.0000 mg | ORAL_TABLET | Freq: Every day | ORAL | 1 refills | Status: DC

## 2021-11-07 NOTE — Telephone Encounter (Signed)
HH wanted dr.Lowne to be aware of vitals. Her bp is staying around 190/112 and she has been experiencing some headaches. [facility] stated they can be called with directions or pt. Please advise.

## 2021-11-08 NOTE — Telephone Encounter (Signed)
HH is still inquiring about starting another rx. Raquel Sarna can be reached at [phone].

## 2021-11-08 NOTE — Telephone Encounter (Signed)
Rx already sent to pharmacy. VM left with Raquel Sarna regarding medication and blood work

## 2021-11-10 ENCOUNTER — Telehealth: Payer: Self-pay | Admitting: Family Medicine

## 2021-11-10 NOTE — Telephone Encounter (Signed)
HH wanted to notify pcp that last two bp readings were 184/90 and 192/94. Only sxs pt has is a headache.

## 2021-11-11 NOTE — Telephone Encounter (Signed)
Spoke with patient. Pt states she just restarted the Losartan. Pharmacy told her it could take a few days before the medication was effective. Her blood pressures recently have been 178/97, 184/90, 192/106. Pt states checking bp today and it was down to 110/77. She states this is the first time in a 1 1/2 weeks that she hasn't had a headache. I advised patient monitor bp through the weekend and only check bps twice a day. An hour or 2 after taking or medication and at bedtime. I alos advised if she develops a headache or dizziness to check her blood pressure. Pt will call on Monday will bps

## 2021-11-14 ENCOUNTER — Telehealth: Payer: Self-pay

## 2021-11-14 NOTE — Telephone Encounter (Signed)
[facility] calling to report elevated BP-   L arm- 174/92 R arm- 170/90  Pt is not having any sx's  Cassandra Rios can be reached at [phone] if questions/concerns.

## 2021-11-17 ENCOUNTER — Other Ambulatory Visit: Payer: Self-pay

## 2021-11-17 ENCOUNTER — Telehealth: Payer: Self-pay | Admitting: Family Medicine

## 2021-11-17 NOTE — Telephone Encounter (Signed)
LVM informing that we are increasing the Losartan from 50 MG to 100 MG once a day.

## 2021-11-17 NOTE — Telephone Encounter (Signed)
Laurey Arrow [facility] PT) stating that pt's BP till in 190s wants to extend care until that BP is lower. Laurey Arrow stated that they would call back with new orders later on.

## 2021-11-17 NOTE — Telephone Encounter (Signed)
Pt made aware about the increase of medication. Pt states she will take two of the 50 MG for now

## 2021-11-18 ENCOUNTER — Ambulatory Visit (INDEPENDENT_AMBULATORY_CARE_PROVIDER_SITE_OTHER): Payer: PPO | Admitting: Family Medicine

## 2021-11-18 ENCOUNTER — Encounter: Payer: Self-pay | Admitting: Family Medicine

## 2021-11-18 VITALS — BP 140/90 | HR 80 | Temp 97.9°F | Resp 20 | Ht 66.0 in | Wt 231.8 lb

## 2021-11-18 DIAGNOSIS — T7840XA Allergy, unspecified, initial encounter: Secondary | ICD-10-CM

## 2021-11-18 DIAGNOSIS — F419 Anxiety disorder, unspecified: Secondary | ICD-10-CM | POA: Diagnosis not present

## 2021-11-18 DIAGNOSIS — I10 Essential (primary) hypertension: Secondary | ICD-10-CM

## 2021-11-18 DIAGNOSIS — E785 Hyperlipidemia, unspecified: Secondary | ICD-10-CM | POA: Diagnosis not present

## 2021-11-18 DIAGNOSIS — E039 Hypothyroidism, unspecified: Secondary | ICD-10-CM

## 2021-11-18 DIAGNOSIS — G35 Multiple sclerosis: Secondary | ICD-10-CM

## 2021-11-18 DIAGNOSIS — G35D Multiple sclerosis, unspecified: Secondary | ICD-10-CM

## 2021-11-18 DIAGNOSIS — Z0000 Encounter for general adult medical examination without abnormal findings: Secondary | ICD-10-CM | POA: Diagnosis not present

## 2021-11-18 DIAGNOSIS — K254 Chronic or unspecified gastric ulcer with hemorrhage: Secondary | ICD-10-CM

## 2021-11-18 LAB — COMPREHENSIVE METABOLIC PANEL
ALT: 19 U/L (ref 0–35)
AST: 38 U/L — ABNORMAL HIGH (ref 0–37)
Albumin: 3.6 g/dL (ref 3.5–5.2)
Alkaline Phosphatase: 84 U/L (ref 39–117)
BUN: 6 mg/dL (ref 6–23)
CO2: 28 mEq/L (ref 19–32)
Calcium: 9.4 mg/dL (ref 8.4–10.5)
Chloride: 104 mEq/L (ref 96–112)
Creatinine, Ser: 0.92 mg/dL (ref 0.40–1.20)
GFR: 62.52 mL/min (ref >60.00)
Glucose, Bld: 106 mg/dL — ABNORMAL HIGH (ref 70–99)
Potassium: 3.9 mEq/L (ref 3.5–5.1)
Sodium: 141 mEq/L (ref 135–145)
Total Bilirubin: 0.5 mg/dL (ref 0.2–1.2)
Total Protein: 6.2 g/dL (ref 6.0–8.3)

## 2021-11-18 LAB — CBC WITH DIFFERENTIAL/PLATELET
Basophils Absolute: 0 10*3/uL (ref 0.0–0.1)
Basophils Relative: 0.7 % (ref 0.0–3.0)
Eosinophils Absolute: 0.1 10*3/uL (ref 0.0–0.7)
Eosinophils Relative: 2 % (ref 0.0–5.0)
HCT: 37.9 % (ref 36.0–46.0)
Hemoglobin: 12.2 g/dL (ref 12.0–15.0)
Lymphocytes Relative: 29.7 % (ref 12.0–46.0)
Lymphs Abs: 1.7 10*3/uL (ref 0.7–4.0)
MCHC: 32.1 g/dL (ref 30.0–36.0)
MCV: 92.4 fl (ref 78.0–100.0)
Monocytes Absolute: 0.5 10*3/uL (ref 0.1–1.0)
Monocytes Relative: 8.9 % (ref 3.0–12.0)
Neutro Abs: 3.3 10*3/uL (ref 1.4–7.7)
Neutrophils Relative %: 58.7 % (ref 43.0–77.0)
Platelets: 145 10*3/uL — ABNORMAL LOW (ref 150.0–400.0)
RBC: 4.11 Mil/uL (ref 3.87–5.11)
RDW: 15.5 % (ref 11.5–15.5)
WBC: 5.6 10*3/uL (ref 4.0–10.5)

## 2021-11-18 LAB — LIPID PANEL
Cholesterol: 116 mg/dL (ref 0–200)
HDL: 49.7 mg/dL (ref >39.00)
LDL Cholesterol: 47 mg/dL (ref 0–99)
NonHDL: 66.04
Total CHOL/HDL Ratio: 2
Triglycerides: 96 mg/dL (ref 0.0–149.0)
VLDL: 19.2 mg/dL (ref 0.0–40.0)

## 2021-11-18 LAB — IBC PANEL
Iron: 64 ug/dL (ref 42–145)
Saturation Ratios: 21 % (ref 20.0–50.0)
TIBC: 305.2 ug/dL (ref 250.0–450.0)
Transferrin: 218 mg/dL (ref 212.0–360.0)

## 2021-11-18 LAB — TSH: TSH: 3 u[IU]/mL (ref 0.35–5.50)

## 2021-11-18 MED ORDER — FLUOXETINE HCL 40 MG PO CAPS: 40.0000 mg | ORAL_CAPSULE | Freq: Every day | ORAL | 3 refills | Status: DC

## 2021-11-18 NOTE — Progress Notes (Signed)
Subjective:     Cassandra Rios is a 71 y.o. female and is here for a comprehensive physical exam. The patient reports problems - headaches when bp was up---  we increased the losartan to 100 mg daily-- she has only taken 100 mg 2 days --- headaches have resolved and she is sleeping better  . Pt has seen cardiology for check on AAA--- all stable  Social History   Socioeconomic History   Marital status: Married    Spouse name: Legrand Como   Number of children: 2   Years of education: 76   Highest education level: Not on file  Occupational History   Occupation: retired  Tobacco Use   Smoking status: Former    Packs/day: 2.00    Years: 40.00    Total pack years: 80.00    Types: Cigarettes    Quit date: 04/05/2007    Years since quitting: 14.6    Passive exposure: Current (Husband)   Smokeless tobacco: Never  Vaping Use   Vaping Use: Not on file  Substance and Sexual Activity   Alcohol use: No   Drug use: No   Sexual activity: Never    Partners: Male  Other Topics Concern   Not on file  Social History Narrative   Patient is right handed,reside in home with husband   Social Determinants of Health   Financial Resource Strain: Not on file  Food Insecurity: Not on file  Transportation Needs: Not on file  Physical Activity: Inactive (04/19/2018)   Exercise Vital Sign    Days of Exercise per Week: 0 days    Minutes of Exercise per Session: 0 min  Stress: No Stress Concern Present (04/19/2018)   [place]    Feeling of Stress : Not at all  Social Connections: Not on file  Intimate Partner Violence: Not on file   Health Maintenance  Topic Date Due   Zoster Vaccines- Shingrix (1 of 2) Never done   TETANUS/TDAP  03/06/2021   INFLUENZA VACCINE  01/03/2022   MAMMOGRAM  06/14/2022   COLONOSCOPY (Pts 45-35yr Insurance coverage will need to be confirmed)  07/26/2025   Pneumonia Vaccine 71 Years old  Completed    DEXA SCAN  Completed   COVID-19 Vaccine  Completed   Hepatitis C Screening  Completed   HPV VACCINES  Aged Out    The following portions of the patient's history were reviewed and updated as appropriate: She  has a past medical history of AAA (abdominal aortic aneurysm) ([place], Allergic rhinitis, Chronic hoarseness, Colon polyps, COPD (chronic obstructive pulmonary disease) ([place], Dyslipidemia, Hypertension, Hypothyroidism, MS (multiple sclerosis) ([place], Obesity, and Stomach ulcer from aspirin/ibuprofen-like drugs (NSAID's). She does not have any pertinent problems on file. She  has a past surgical history that includes Cholecystectomy; Carpal tunnel release; Bariatric Surgery (09/07/08); Hiatal hernia repair (09/07/08); Abdominal hysterectomy; Cholecystectomy; Cataract extraction, bilateral; Cataract extraction (Right, 11/08/2017); Cataract extraction (Left, 11/22/2017); LEFT HEART CATH AND CORONARY ANGIOGRAPHY (N/A, 07/09/2019); and Esophagogastroduodenoscopy (egd) with propofol (N/A, 10/17/2021). Her family history includes AAA (abdominal aortic aneurysm) in her father and sister; Arthritis in her sister; COPD in her father and sister; Dementia in her sister; Heart disease in her father and mother; Other in her sister; Stroke (age of onset: 786 in her mother. She  reports that she quit smoking about 14 years ago. Her smoking use included cigarettes. She has a 80.00 pack-year smoking history. She has been exposed to tobacco smoke. She  has never used smokeless tobacco. She reports that she does not drink alcohol and does not use drugs. She has a current medication list which includes the following prescription(s): acetaminophen, atorvastatin, calcium carbonate, ferrous sulfate, gabapentin, hydrocodone-acetaminophen, interferon beta-1b, levocetirizine, levothyroxine, losartan, mometasone, nitroglycerin, pantoprazole, polyethyl glycol-propyl glycol, silver sulfadiazine, triamcinolone ointment, and  fluoxetine. Current Outpatient Medications on File Prior to Visit  Medication Sig Dispense Refill   acetaminophen (TYLENOL) 500 MG tablet Take 1,000 mg by mouth every 6 (six) hours as needed for moderate pain or headache.     atorvastatin (LIPITOR) 80 MG tablet Take 1 tablet (80 mg total) by mouth daily. 90 tablet 3   calcium carbonate (TUMS - DOSED IN MG ELEMENTAL CALCIUM) 500 MG chewable tablet Chew 2 tablets by mouth daily as needed for indigestion or heartburn.     ferrous sulfate 325 (65 FE) MG EC tablet Take 1 tablet (325 mg total) by mouth 2 (two) times daily. 60 tablet 0   gabapentin (NEURONTIN) 300 MG capsule Take 600 mg by mouth every evening.      HYDROcodone-acetaminophen (NORCO/VICODIN) 5-325 MG per tablet Take 0.5 tablets by mouth 2 (two) times daily as needed for moderate pain.      Interferon Beta-1b (BETASERON/EXTAVIA) 0.3 MG KIT injection Inject 0.25 mg into the skin every other day.     levocetirizine (XYZAL) 5 MG tablet Take 1 tablet (5 mg total) by mouth every evening. (Patient taking differently: Take 5 mg by mouth daily as needed for allergies.) 30 tablet 5   levothyroxine (SYNTHROID) 112 MCG tablet TAKE 1 TABLET BY MOUTH EVERY DAY BEFORE BREAKFAST (Patient taking differently: Take 112 mcg by mouth daily before breakfast.) 90 tablet 1   losartan (COZAAR) 50 MG tablet Take 1 tablet (50 mg total) by mouth daily. 90 tablet 1   mometasone (NASONEX) 50 MCG/ACT nasal spray Place 2 sprays into the nose daily as needed (allergies).     nitroGLYCERIN (NITROSTAT) 0.4 MG SL tablet Place 1 tablet (0.4 mg total) under the tongue every 5 (five) minutes as needed for chest pain. 30 tablet 3   pantoprazole (PROTONIX) 40 MG tablet Take 1 tablet (40 mg total) by mouth 2 (two) times daily before a meal. 60 tablet 3   Polyethyl Glycol-Propyl Glycol (SYSTANE ULTRA OP) Place 1 drop into the right eye daily as needed (dry eye).      silver sulfADIAZINE (SILVADENE) 1 % cream Apply 1 application.  topically daily as needed (irritated areas under abdomen).     triamcinolone ointment (KENALOG) 0.1 % Apply 1 application. topically daily as needed (rash).     No current facility-administered medications on file prior to visit.   She is allergic to codeine, hydromorphone hcl, ibuprofen, morphine sulfate, and metronidazole..  Review of Systems   Objective:  Review of Systems  Constitutional: Negative for activity change, appetite change and fatigue.  HENT: Negative for hearing loss, congestion, tinnitus and ear discharge.  dentist q37mEyes: Negative for visual disturbance (see optho q1y -- vision corrected to 20/20 with glasses).  Respiratory: Negative for cough, chest tightness and shortness of breath.   Cardiovascular: Negative for chest pain, palpitations and leg swelling.  Gastrointestinal: Negative for abdominal pain, diarrhea, constipation and abdominal distention.  Genitourinary: Negative for urgency, frequency, decreased urine volume and difficulty urinating.  Musculoskeletal: Negative for back pain, arthralgias and gait problem.  Skin: Negative for color change, pallor and rash.  Neurological: Negative for dizziness, light-headedness, numbness and headaches.  Hematological: Negative for adenopathy. Does  not bruise/bleed easily.  Psychiatric/Behavioral: Negative for suicidal ideas, confusion, sleep disturbance, self-injury, dysphoric mood, decreased concentration and agitation.      BP 140/90 (BP Location: Left Arm, Patient Position: Sitting, Cuff Size: Large)   Pulse 80   Temp 97.9 F (36.6 C) (Oral)   Resp 20   Ht _0  (1.676 m)   Wt 231 lb 12.8 oz (105.1 kg)   SpO2 98%   BMI 37.41 kg/m  General appearance: alert, cooperative, appears stated age, and no distress Head: Normocephalic, without obvious abnormality, atraumatic Eyes: conjunctivae/corneas clear. PERRL, EOM's intact. Fundi benign. Ears: normal TM's and external ear canals both ears Nose: Nares normal.  Septum midline. Mucosa normal. No drainage or sinus tenderness. Throat: lips, mucosa, and tongue normal; teeth and gums normal Neck: no adenopathy, no carotid bruit, no JVD, supple, symmetrical, trachea midline, and thyroid not enlarged, symmetric, no tenderness/mass/nodules Back: symmetric, no curvature. ROM normal. No CVA tenderness. Lungs: clear to auscultation bilaterally Heart: regular rate and rhythm, S1, S2 normal, no murmur, click, rub or gallop Abdomen: soft, non-tender; bowel sounds normal; no masses,  no organomegaly Extremities: extremities normal, atraumatic, no cyanosis or edema Pulses: 2+ and symmetric Skin: Skin color, texture, turgor normal. No rashes or lesions Lymph nodes: Cervical, supraclavicular, and axillary nodes normal. Neurologic: Alert and oriented X 3, normal strength and tone. Normal symmetric reflexes. Normal coordination and gait    Assessment:    Healthy female exam.      Plan:    Ghm utd Check labs  See After Visit Summary for Counseling Recommendations    1. Primary hypertension Ghm utd - Comprehensive metabolic panel - CBC with Differential/Platelet  2. Multiple sclerosis ([place]) Per neuro  3. Hypothyroidism, unspecified type Check labs  - TSH  4. Allergy, initial encounter Stable   5. Preventative health care Ghm utd  Check labs   6. Hyperlipidemia, unspecified hyperlipidemia type Encourage heart healthy diet such as MIND or DASH diet, increase exercise, avoid trans fats, simple carbohydrates and processed foods, consider a krill or fish or flaxseed oil cap daily.   - Comprehensive metabolic panel - Lipid panel  7. Gastrointestinal hemorrhage associated with gastric ulcer stable - IBC panel

## 2021-11-18 NOTE — Patient Instructions (Signed)
Preventive Care 65 Years and Older, Female Preventive care refers to lifestyle choices and visits with your health care provider that can promote health and wellness. Preventive care visits are also called wellness exams. What can I expect for my preventive care visit? Counseling Your health care provider may ask you questions about your: Medical history, including: Past medical problems. Family medical history. Pregnancy and menstrual history. History of falls. Current health, including: Memory and ability to understand (cognition). Emotional well-being. Home life and relationship well-being. Sexual activity and sexual health. Lifestyle, including: Alcohol, nicotine or tobacco, and drug use. Access to firearms. Diet, exercise, and sleep habits. Work and work environment. Sunscreen use. Safety issues such as seatbelt and bike helmet use. Physical exam Your health care provider will check your: Height and weight. These may be used to calculate your BMI (body mass index). BMI is a measurement that tells if you are at a healthy weight. Waist circumference. This measures the distance around your waistline. This measurement also tells if you are at a healthy weight and may help predict your risk of certain diseases, such as type 2 diabetes and high blood pressure. Heart rate and blood pressure. Body temperature. Skin for abnormal spots. What immunizations do I need?  Vaccines are usually given at various ages, according to a schedule. Your health care provider will recommend vaccines for you based on your age, medical history, and lifestyle or other factors, such as travel or where you work. What tests do I need? Screening Your health care provider may recommend screening tests for certain conditions. This may include: Lipid and cholesterol levels. Hepatitis C test. Hepatitis B test. HIV (human immunodeficiency virus) test. STI (sexually transmitted infection) testing, if you are at  risk. Lung cancer screening. Colorectal cancer screening. Diabetes screening. This is done by checking your blood sugar (glucose) after you have not eaten for a while (fasting). Mammogram. Talk with your health care provider about how often you should have regular mammograms. BRCA-related cancer screening. This may be done if you have a family history of breast, ovarian, tubal, or peritoneal cancers. Bone density scan. This is done to screen for osteoporosis. Talk with your health care provider about your test results, treatment options, and if necessary, the need for more tests. Follow these instructions at home: Eating and drinking  Eat a diet that includes fresh fruits and vegetables, whole grains, lean protein, and low-fat dairy products. Limit your intake of foods with high amounts of sugar, saturated fats, and salt. Take vitamin and mineral supplements as recommended by your health care provider. Do not drink alcohol if your health care provider tells you not to drink. If you drink alcohol: Limit how much you have to 0-1 drink a day. Know how much alcohol is in your drink. In the [place] one drink equals one 12 oz bottle of beer (355 mL), one 5 oz glass of wine (148 mL), or one 1 oz glass of hard liquor (44 mL). Lifestyle Brush your teeth every morning and night with fluoride toothpaste. Floss one time each day. Exercise for at least 30 minutes 5 or more days each week. Do not use any products that contain nicotine or tobacco. These products include cigarettes, chewing tobacco, and vaping devices, such as e-cigarettes. If you need help quitting, ask your health care provider. Do not use drugs. If you are sexually active, practice safe sex. Use a condom or other form of protection in order to prevent STIs. Take aspirin only as told by   your health care provider. Make sure that you understand how much to take and what form to take. Work with your health care provider to find out whether it  is safe and beneficial for you to take aspirin daily. Ask your health care provider if you need to take a cholesterol-lowering medicine (statin). Find healthy ways to manage stress, such as: Meditation, yoga, or listening to music. Journaling. Talking to a trusted person. Spending time with friends and family. Minimize exposure to UV radiation to reduce your risk of skin cancer. Safety Always wear your seat belt while driving or riding in a vehicle. Do not drive: If you have been drinking alcohol. Do not ride with someone who has been drinking. When you are tired or distracted. While texting. If you have been using any mind-altering substances or drugs. Wear a helmet and other protective equipment during sports activities. If you have firearms in your house, make sure you follow all gun safety procedures. What's next? Visit your health care provider once a year for an annual wellness visit. Ask your health care provider how often you should have your eyes and teeth checked. Stay up to date on all vaccines. This information is not intended to replace advice given to you by your health care provider. Make sure you discuss any questions you have with your health care provider. Document Revised: 11/17/2020 Document Reviewed: 11/17/2020 Elsevier Patient Education  2023 Elsevier Inc.  

## 2021-11-21 DIAGNOSIS — M5136 Other intervertebral disc degeneration, lumbar region: Secondary | ICD-10-CM | POA: Diagnosis not present

## 2021-11-21 DIAGNOSIS — M549 Dorsalgia, unspecified: Secondary | ICD-10-CM | POA: Diagnosis not present

## 2021-11-22 DIAGNOSIS — L292 Pruritus vulvae: Secondary | ICD-10-CM | POA: Diagnosis not present

## 2021-11-22 DIAGNOSIS — L900 Lichen sclerosus et atrophicus: Secondary | ICD-10-CM | POA: Diagnosis not present

## 2021-11-24 DIAGNOSIS — I5030 Unspecified diastolic (congestive) heart failure: Secondary | ICD-10-CM | POA: Insufficient documentation

## 2021-11-25 ENCOUNTER — Ambulatory Visit (INDEPENDENT_AMBULATORY_CARE_PROVIDER_SITE_OTHER): Payer: PPO | Admitting: Physician Assistant

## 2021-11-25 ENCOUNTER — Encounter: Payer: Self-pay | Admitting: Physician Assistant

## 2021-11-25 ENCOUNTER — Encounter: Payer: PPO | Admitting: Family Medicine

## 2021-11-25 VITALS — BP 134/70 | HR 88 | Ht 66.0 in | Wt 229.4 lb

## 2021-11-25 DIAGNOSIS — I5032 Chronic diastolic (congestive) heart failure: Secondary | ICD-10-CM | POA: Diagnosis not present

## 2021-11-25 DIAGNOSIS — I25118 Atherosclerotic heart disease of native coronary artery with other forms of angina pectoris: Secondary | ICD-10-CM

## 2021-11-25 DIAGNOSIS — I700 Atherosclerosis of aorta: Secondary | ICD-10-CM | POA: Diagnosis not present

## 2021-11-25 DIAGNOSIS — E785 Hyperlipidemia, unspecified: Secondary | ICD-10-CM | POA: Diagnosis not present

## 2021-11-25 DIAGNOSIS — I10 Essential (primary) hypertension: Secondary | ICD-10-CM | POA: Diagnosis not present

## 2021-11-25 DIAGNOSIS — I714 Abdominal aortic aneurysm, without rupture, unspecified: Secondary | ICD-10-CM

## 2021-11-25 MED ORDER — EMPAGLIFLOZIN 10 MG PO TABS: 10.0000 mg | ORAL_TABLET | Freq: Every day | ORAL | 1 refills | Status: DC

## 2021-11-25 NOTE — Assessment & Plan Note (Addendum)
Continue statin.  We will see if we can place her on clopidogrel if acceptable with gastroenterology.

## 2021-11-29 ENCOUNTER — Ambulatory Visit: Payer: PPO | Admitting: Diagnostic Neuroimaging

## 2021-11-29 ENCOUNTER — Telehealth: Payer: Self-pay | Admitting: Family Medicine

## 2021-11-29 ENCOUNTER — Encounter: Payer: Self-pay | Admitting: Diagnostic Neuroimaging

## 2021-11-29 VITALS — BP 178/102 | HR 76 | Ht 66.0 in | Wt 226.0 lb

## 2021-11-29 DIAGNOSIS — G35 Multiple sclerosis: Secondary | ICD-10-CM | POA: Diagnosis not present

## 2021-11-29 DIAGNOSIS — G44209 Tension-type headache, unspecified, not intractable: Secondary | ICD-10-CM | POA: Diagnosis not present

## 2021-11-29 DIAGNOSIS — G250 Essential tremor: Secondary | ICD-10-CM | POA: Diagnosis not present

## 2021-11-29 DIAGNOSIS — Z5181 Encounter for therapeutic drug level monitoring: Secondary | ICD-10-CM

## 2021-12-02 ENCOUNTER — Other Ambulatory Visit: Payer: Self-pay

## 2021-12-02 ENCOUNTER — Telehealth: Payer: Self-pay | Admitting: Family Medicine

## 2021-12-02 ENCOUNTER — Telehealth: Payer: Self-pay | Admitting: Diagnostic Neuroimaging

## 2021-12-02 MED ORDER — METOPROLOL SUCCINATE ER 50 MG PO TB24: 50.0000 mg | ORAL_TABLET | Freq: Every day | ORAL | 2 refills | Status: DC

## 2021-12-02 NOTE — Telephone Encounter (Signed)
Pt called back. Medication is covered. Disregard previous message

## 2021-12-02 NOTE — Telephone Encounter (Signed)
Marita Kansas the PT assistant from [facility] was calling to dicuss the pt's bp readings that were outside parameters. She stated that the patient will be calling Neurologist about bps, and for any questions we can call the pt and discuss with her the reading. Please advise.

## 2021-12-02 NOTE — Telephone Encounter (Signed)
Pt made aware and rx sent in. F/u scheduled

## 2021-12-02 NOTE — Telephone Encounter (Signed)
Pt states when she last saw Dr Leta Baptist he told her he would be willing to manage her blood pressure medications.  Pt has not been able to connect with her PCP on this request so she would very much like for Dr Leta Baptist and Jinny Blossom, NP to manage her blood pressure medications. Pt aware the office is not open today (6-30), but she would like a call from RN to discuss this request on Monday.

## 2021-12-02 NOTE — Telephone Encounter (Signed)
Pt states Toprol- XL isn't covered by insurance.

## 2021-12-02 NOTE — Telephone Encounter (Signed)
Noted. Will document on other tele note

## 2021-12-05 ENCOUNTER — Telehealth: Payer: Self-pay | Admitting: Family Medicine

## 2021-12-05 DIAGNOSIS — Z6837 Body mass index (BMI) 37.0-37.9, adult: Secondary | ICD-10-CM | POA: Diagnosis not present

## 2021-12-05 DIAGNOSIS — I10 Essential (primary) hypertension: Secondary | ICD-10-CM | POA: Diagnosis not present

## 2021-12-05 DIAGNOSIS — E039 Hypothyroidism, unspecified: Secondary | ICD-10-CM | POA: Diagnosis not present

## 2021-12-05 DIAGNOSIS — M6281 Muscle weakness (generalized): Secondary | ICD-10-CM | POA: Diagnosis not present

## 2021-12-05 DIAGNOSIS — E669 Obesity, unspecified: Secondary | ICD-10-CM | POA: Diagnosis not present

## 2021-12-05 DIAGNOSIS — R109 Unspecified abdominal pain: Secondary | ICD-10-CM | POA: Diagnosis not present

## 2021-12-05 DIAGNOSIS — E785 Hyperlipidemia, unspecified: Secondary | ICD-10-CM | POA: Diagnosis not present

## 2021-12-05 DIAGNOSIS — G35 Multiple sclerosis: Secondary | ICD-10-CM | POA: Diagnosis not present

## 2021-12-05 DIAGNOSIS — I714 Abdominal aortic aneurysm, without rupture, unspecified: Secondary | ICD-10-CM | POA: Diagnosis not present

## 2021-12-05 DIAGNOSIS — L900 Lichen sclerosus et atrophicus: Secondary | ICD-10-CM | POA: Diagnosis not present

## 2021-12-05 DIAGNOSIS — L98499 Non-pressure chronic ulcer of skin of other sites with unspecified severity: Secondary | ICD-10-CM | POA: Diagnosis not present

## 2021-12-05 NOTE — Telephone Encounter (Signed)
[place] physical therapist called to update dr ab pt's condition. She started taking new medication and rate is at 48 currently, and bp 172/82 resting. Pt is still experiencing a headache. He would like to know if they can get orders for a nurse to come out and evaluate her.

## 2021-12-05 NOTE — Telephone Encounter (Signed)
Contacted pt back, she stated she was able to get in touch with her PCP Friday, they are going to trial beta blocker for 30 days and go form there. Pt was appreciative of call back.

## 2021-12-08 NOTE — Telephone Encounter (Signed)
Returned call. LVM with order

## 2021-12-09 ENCOUNTER — Other Ambulatory Visit: Payer: PPO

## 2021-12-13 ENCOUNTER — Telehealth: Payer: Self-pay | Admitting: Family Medicine

## 2021-12-13 NOTE — Telephone Encounter (Signed)
Cassandra Rios PT with [facility] - [phone]  Cassandra Rios wanted to make Dr. Etter Sjogren aware that pt's bp has still been in the high numbers (last reading 160/90). He states that pt is not due to see Dr. Etter Sjogren until 07/37.

## 2021-12-19 NOTE — Telephone Encounter (Signed)
Pt has appt on 01/02/22. Do you want to change meds again while we wait for her appointment?

## 2021-12-20 MED ORDER — LOSARTAN POTASSIUM 100 MG PO TABS: 100.0000 mg | ORAL_TABLET | Freq: Every day | ORAL | 1 refills | Status: DC

## 2021-12-20 MED ORDER — HYDROCHLOROTHIAZIDE 25 MG PO TABS: 25.0000 mg | ORAL_TABLET | Freq: Every day | ORAL | 0 refills | Status: DC

## 2021-12-20 NOTE — Telephone Encounter (Signed)
Patient notified.  She needed rx for losartan called in as well.  Rx sent in for that and hctz.

## 2021-12-20 NOTE — Addendum Note (Signed)
Addended by: Kem Boroughs D on: 12/20/2021 04:12 PM   Modules accepted: Orders

## 2021-12-21 ENCOUNTER — Telehealth: Payer: Self-pay | Admitting: Family Medicine

## 2021-12-21 NOTE — Telephone Encounter (Signed)
Caller/Agency: [facility] Callback Number: [phone] Requesting OT/PT/Skilled Nursing/Social Work/Speech Therapy: PT Frequency: 1x for 4 weeks  Also wanted to report bp reading. Pt used old machine before taking losartan and it was 190/88  She then took losartan at 9:30.  Bp taken 30 min ago was 172/74 and 10 min ago was 152/60 with no sxs.

## 2021-12-21 NOTE — Telephone Encounter (Signed)
Verba given and FYI about blood pressures

## 2021-12-23 NOTE — Telephone Encounter (Signed)
Verbal given. Pt has appt scheduled.

## 2022-01-02 ENCOUNTER — Ambulatory Visit (INDEPENDENT_AMBULATORY_CARE_PROVIDER_SITE_OTHER): Payer: PPO | Admitting: Family Medicine

## 2022-01-02 ENCOUNTER — Encounter: Payer: Self-pay | Admitting: Family Medicine

## 2022-01-02 VITALS — BP 140/90 | HR 86 | Temp 97.9°F | Resp 20 | Ht 66.0 in | Wt 220.0 lb

## 2022-01-02 DIAGNOSIS — I10 Essential (primary) hypertension: Secondary | ICD-10-CM

## 2022-01-02 DIAGNOSIS — R251 Tremor, unspecified: Secondary | ICD-10-CM | POA: Diagnosis not present

## 2022-01-02 DIAGNOSIS — K254 Chronic or unspecified gastric ulcer with hemorrhage: Secondary | ICD-10-CM | POA: Diagnosis not present

## 2022-01-02 LAB — IBC + FERRITIN
Ferritin: 52.8 ng/mL (ref 10.0–291.0)
Iron: 70 ug/dL (ref 42–145)
Saturation Ratios: 19.3 % — ABNORMAL LOW (ref 20.0–50.0)
TIBC: 362.6 ug/dL (ref 250.0–450.0)
Transferrin: 259 mg/dL (ref 212.0–360.0)

## 2022-01-02 LAB — CBC WITH DIFFERENTIAL/PLATELET
Basophils Absolute: 0 10*3/uL (ref 0.0–0.1)
Basophils Relative: 0.6 % (ref 0.0–3.0)
Eosinophils Absolute: 0.2 10*3/uL (ref 0.0–0.7)
Eosinophils Relative: 2.7 % (ref 0.0–5.0)
HCT: 41.9 % (ref 36.0–46.0)
Hemoglobin: 13.6 g/dL (ref 12.0–15.0)
Lymphocytes Relative: 21.1 % (ref 12.0–46.0)
Lymphs Abs: 1.3 10*3/uL (ref 0.7–4.0)
MCHC: 32.6 g/dL (ref 30.0–36.0)
MCV: 88.5 fl (ref 78.0–100.0)
Monocytes Absolute: 0.7 10*3/uL (ref 0.1–1.0)
Monocytes Relative: 10.9 % (ref 3.0–12.0)
Neutro Abs: 4.1 10*3/uL (ref 1.4–7.7)
Neutrophils Relative %: 64.7 % (ref 43.0–77.0)
Platelets: 130 10*3/uL — ABNORMAL LOW (ref 150.0–400.0)
RBC: 4.73 Mil/uL (ref 3.87–5.11)
RDW: 13.7 % (ref 11.5–15.5)
WBC: 6.3 10*3/uL (ref 4.0–10.5)

## 2022-01-02 LAB — COMPREHENSIVE METABOLIC PANEL
ALT: 21 U/L (ref 0–35)
AST: 39 U/L — ABNORMAL HIGH (ref 0–37)
Albumin: 4 g/dL (ref 3.5–5.2)
Alkaline Phosphatase: 84 U/L (ref 39–117)
BUN: 8 mg/dL (ref 6–23)
CO2: 28 mEq/L (ref 19–32)
Calcium: 9.9 mg/dL (ref 8.4–10.5)
Chloride: 98 mEq/L (ref 96–112)
Creatinine, Ser: 1.04 mg/dL (ref 0.40–1.20)
GFR: 53.92 mL/min — ABNORMAL LOW (ref >60.00)
Glucose, Bld: 106 mg/dL — ABNORMAL HIGH (ref 70–99)
Potassium: 4.7 mEq/L (ref 3.5–5.1)
Sodium: 134 mEq/L — ABNORMAL LOW (ref 135–145)
Total Bilirubin: 0.5 mg/dL (ref 0.2–1.2)
Total Protein: 6.9 g/dL (ref 6.0–8.3)

## 2022-01-02 MED ORDER — PROPRANOLOL HCL 10 MG PO TABS: 10.0000 mg | ORAL_TABLET | Freq: Three times a day (TID) | ORAL | 0 refills | Status: DC

## 2022-01-02 MED ORDER — PANTOPRAZOLE SODIUM 40 MG PO TBEC: 40.0000 mg | DELAYED_RELEASE_TABLET | Freq: Two times a day (BID) | ORAL | 3 refills | Status: DC

## 2022-01-02 NOTE — Assessment & Plan Note (Signed)
con't protonix bid

## 2022-01-02 NOTE — Patient Instructions (Signed)
Tremor A tremor is trembling or shaking that you cannot control. Most tremors affect the hands or arms. Tremors can also affect the head, vocal cords, face, and other parts of the body. There are many types of tremors. Common types include: Essential tremor. These usually occur in people older than 40. This type of tremor may run in families and can happen in otherwise healthy people. Resting tremor. These occur when the muscles are at rest, such as when your hands are resting in your lap. People with Parkinson's disease often have resting tremors. Postural tremor. These occur when you try to hold a pose, such as keeping your hands outstretched. Kinetic tremor. These occur during purposeful movement, such as trying to touch a finger to your nose. Task-specific tremor. These may occur when you do certain tasks such as writing, speaking, or standing. Psychogenic tremor. These are greatly reduced or go away when you are distracted. These tremors happen due to underlying stress or psychiatric disease. They can happen in people of all ages. Some types of tremors have no known cause. Tremors can also be a symptom of nervous system problems (neurological disorders) that may occur with aging. Some tremors go away with treatment, while others do not. Follow these instructions at home: Lifestyle     If you drink alcohol: Limit how much you have to: 0-1 drink a day for women who are not pregnant. 0-2 drinks a day for men. Know how much alcohol is in a drink. In the [place] one drink equals one 12 oz bottle of beer (355 mL), one 5 oz glass of wine (148 mL), or one 1 oz glass of hard liquor (44 mL). Do not use any products that contain nicotine or tobacco. These products include cigarettes, chewing tobacco, and vaping devices, such as e-cigarettes. If you need help quitting, ask your health care provider. Avoid extreme heat and extreme cold. Limit your caffeine intake, as told by your health care  provider. Try to get 8 hours of sleep each night. Find ways to manage your stress, such as meditation or yoga. General instructions Take over-the-counter and prescription medicines only as told by your health care provider. Keep all follow-up visits. This is important. Contact a health care provider if: You develop a tremor after starting a new medicine. You have a tremor along with other symptoms such as: Numbness. Tingling. Pain. Weakness. Your tremor gets worse. Your tremor interferes with your day-to-day life. Summary A tremor is trembling or shaking that you cannot control. Most tremors affect the hands or arms. Some types of tremors have no known cause. Others may be a symptom of nervous system problems (neurological disorders). Make sure you discuss any tremors you have with your health care provider. This information is not intended to replace advice given to you by your health care provider. Make sure you discuss any questions you have with your health care provider. Document Revised: 03/11/2021 Document Reviewed: 03/11/2021 Elsevier Patient Education  2023 Elsevier Inc.  

## 2022-01-02 NOTE — Progress Notes (Signed)
Subjective:   By signing my name below, I, Carylon Perches, attest that this documentation has been prepared under the direction and in the presence of Ann Held DO 01/02/2022   Patient ID: Cassandra Rios, female    DOB: 1951-04-01, 71 y.o.   MRN: [id number]  Chief Complaint  Patient presents with   Hypertension   Follow-up    HPI Patient is in today for an office visit   She is requesting a refill of 40 Mg of Pantoprazole.   She reports that her blood pressure has been elevating and inconsistent. Her systolic ranges from 071 -219 and her diastolic from 75-883 BP Readings from Last 3 Encounters:  01/02/22 140/90  11/29/21 (!) 178/102  11/25/21 134/70   Pulse Readings from Last 3 Encounters:  01/02/22 86  11/29/21 76  11/25/21 88   She was previously taking 50 Mg of Metropolol which stopped her tremoring but dropped her heart rate to about 40. She is requesting to be prescribed a medication that is short term but effective for 30 days.   She is requesting to get her iron levels checked to see if or when she needs to take her iron supplements.   She is inquiring about whether her platelets levels are normal.  Past Medical History:  Diagnosis Date   AAA (abdominal aortic aneurysm) (HCC)    Allergic rhinitis    Chronic hoarseness    Colon polyps    COPD (chronic obstructive pulmonary disease) ([place])    Dr. Chase Caller   Dyslipidemia    Gastric bleed 10/2021   stomach ulcers   Hypertension    Hypothyroidism    MS (multiple sclerosis) ([place])    Obesity    Stomach ulcer from aspirin/ibuprofen-like drugs (NSAID's)     Past Surgical History:  Procedure Laterality Date   ABDOMINAL HYSTERECTOMY     BARIATRIC SURGERY  09/07/08   lap band   CARPAL TUNNEL RELEASE     CATARACT EXTRACTION Right 11/08/2017   CATARACT EXTRACTION Left 11/22/2017   CATARACT EXTRACTION, BILATERAL      Toric lenses for astigmatism   CHOLECYSTECTOMY     CHOLECYSTECTOMY      ESOPHAGOGASTRODUODENOSCOPY (EGD) WITH PROPOFOL N/A 10/17/2021   Procedure: ESOPHAGOGASTRODUODENOSCOPY (EGD) WITH PROPOFOL;  Surgeon: Arta Silence, MD;  Location: [place];  Service: Gastroenterology;  Laterality: N/A;   HIATAL HERNIA REPAIR  09/07/08   LEFT HEART CATH AND CORONARY ANGIOGRAPHY N/A 07/09/2019   Procedure: LEFT HEART CATH AND CORONARY ANGIOGRAPHY;  Surgeon: Belva Crome, MD;  Location: [facility];  Service: Cardiovascular;  Laterality: N/A;    Family History  Problem Relation Age of Onset   Stroke Mother 58   Heart disease Mother        congenital heart defect?   COPD Father    Heart disease Father    AAA (abdominal aortic aneurysm) Father    COPD Sister    Arthritis Sister    AAA (abdominal aortic aneurysm) Sister    Dementia Sister    Other Sister        brain injury   Multiple sclerosis Neg Hx     Social History   Socioeconomic History   Marital status: Married    Spouse name: Legrand Como   Number of children: 2   Years of education: 16   Highest education level: Not on file  Occupational History   Occupation: retired  Tobacco Use   Smoking status: Former   Subjective:   By signing my name below, I, Amber Collins, attest that this documentation has been prepared under the direction and in the presence of Lowne Chase, Kamila Broda R DO 01/02/2022   Patient ID: Cassandra Rios, female    DOB: 07/17/1950, 71 y.o.   MRN: [id number]  Chief Complaint  Patient presents with   Hypertension   Follow-up    HPI Patient is in today for an office visit   She is requesting a refill of 40 Mg of Pantoprazole.   She reports that her blood pressure has been elevating and inconsistent. Her systolic ranges from 119 -207 and her diastolic from 61-106 BP Readings from Last 3 Encounters:  01/02/22 140/90  11/29/21 (!) 178/102  11/25/21 134/70   Pulse Readings from Last 3 Encounters:  01/02/22 86  11/29/21 76  11/25/21 88   She was previously taking 50 Mg of Metropolol which stopped her tremoring but dropped her heart rate to about 40. She is requesting to be prescribed a medication that is short term but effective for 30 days.   She is requesting to get her iron levels checked to see if or when she needs to take her iron supplements.   She is inquiring about whether her platelets levels are normal.  Past Medical History:  Diagnosis Date   AAA (abdominal aortic aneurysm) (HCC)    Allergic rhinitis    Chronic hoarseness    Colon polyps    COPD (chronic obstructive pulmonary disease) (HCC)    Dr. Ramaswamy   Dyslipidemia    Gastric bleed 10/2021   stomach ulcers   Hypertension    Hypothyroidism    MS (multiple sclerosis) (HCC)    Obesity    Stomach ulcer from aspirin/ibuprofen-like drugs (NSAID's)     Past Surgical History:  Procedure Laterality Date   ABDOMINAL HYSTERECTOMY     BARIATRIC SURGERY  09/07/08   lap band   CARPAL TUNNEL RELEASE     CATARACT EXTRACTION Right 11/08/2017   CATARACT EXTRACTION Left 11/22/2017   CATARACT EXTRACTION, BILATERAL      Toric lenses for astigmatism   CHOLECYSTECTOMY     CHOLECYSTECTOMY      ESOPHAGOGASTRODUODENOSCOPY (EGD) WITH PROPOFOL N/A 10/17/2021   Procedure: ESOPHAGOGASTRODUODENOSCOPY (EGD) WITH PROPOFOL;  Surgeon: Outlaw, William, MD;  Location: [facility];  Service: Gastroenterology;  Laterality: N/A;   HIATAL HERNIA REPAIR  09/07/08   LEFT HEART CATH AND CORONARY ANGIOGRAPHY N/A 07/09/2019   Procedure: LEFT HEART CATH AND CORONARY ANGIOGRAPHY;  Surgeon: Smith, Henry W, MD;  Location: [facility];  Service: Cardiovascular;  Laterality: N/A;    Family History  Problem Relation Age of Onset   Stroke Mother 77   Heart disease Mother        congenital heart defect?   COPD Father    Heart disease Father    AAA (abdominal aortic aneurysm) Father    COPD Sister    Arthritis Sister    AAA (abdominal aortic aneurysm) Sister    Dementia Sister    Other Sister        brain injury   Multiple sclerosis Neg Hx     Social History   Socioeconomic History   Marital status: Married    Spouse name: Michael   Number of children: 2   Years of education: 16   Highest education level: Not on file  Occupational History   Occupation: retired  Tobacco Use   Smoking status: Former       Subjective:   By signing my name below, I, Amber Collins, attest that this documentation has been prepared under the direction and in the presence of Lowne Chase, Yvonne R DO 01/02/2022   Patient ID: Cassandra Rios, female    DOB: 07/17/1950, 71 y.o.   MRN: [id number]  Chief Complaint  Patient presents with   Hypertension   Follow-up    HPI Patient is in today for an office visit   She is requesting a refill of 40 Mg of Pantoprazole.   She reports that her blood pressure has been elevating and inconsistent. Her systolic ranges from 119 -207 and her diastolic from 61-106 BP Readings from Last 3 Encounters:  01/02/22 140/90  11/29/21 (!) 178/102  11/25/21 134/70   Pulse Readings from Last 3 Encounters:  01/02/22 86  11/29/21 76  11/25/21 88   She was previously taking 50 Mg of Metropolol which stopped her tremoring but dropped her heart rate to about 40. She is requesting to be prescribed a medication that is short term but effective for 30 days.   She is requesting to get her iron levels checked to see if or when she needs to take her iron supplements.   She is inquiring about whether her platelets levels are normal.  Past Medical History:  Diagnosis Date   AAA (abdominal aortic aneurysm) (HCC)    Allergic rhinitis    Chronic hoarseness    Colon polyps    COPD (chronic obstructive pulmonary disease) (HCC)    Dr. Ramaswamy   Dyslipidemia    Gastric bleed 10/2021   stomach ulcers   Hypertension    Hypothyroidism    MS (multiple sclerosis) (HCC)    Obesity    Stomach ulcer from aspirin/ibuprofen-like drugs (NSAID's)     Past Surgical History:  Procedure Laterality Date   ABDOMINAL HYSTERECTOMY     BARIATRIC SURGERY  09/07/08   lap band   CARPAL TUNNEL RELEASE     CATARACT EXTRACTION Right 11/08/2017   CATARACT EXTRACTION Left 11/22/2017   CATARACT EXTRACTION, BILATERAL      Toric lenses for astigmatism   CHOLECYSTECTOMY     CHOLECYSTECTOMY      ESOPHAGOGASTRODUODENOSCOPY (EGD) WITH PROPOFOL N/A 10/17/2021   Procedure: ESOPHAGOGASTRODUODENOSCOPY (EGD) WITH PROPOFOL;  Surgeon: Outlaw, William, MD;  Location: [facility];  Service: Gastroenterology;  Laterality: N/A;   HIATAL HERNIA REPAIR  09/07/08   LEFT HEART CATH AND CORONARY ANGIOGRAPHY N/A 07/09/2019   Procedure: LEFT HEART CATH AND CORONARY ANGIOGRAPHY;  Surgeon: Smith, Henry W, MD;  Location: [facility];  Service: Cardiovascular;  Laterality: N/A;    Family History  Problem Relation Age of Onset   Stroke Mother 77   Heart disease Mother        congenital heart defect?   COPD Father    Heart disease Father    AAA (abdominal aortic aneurysm) Father    COPD Sister    Arthritis Sister    AAA (abdominal aortic aneurysm) Sister    Dementia Sister    Other Sister        brain injury   Multiple sclerosis Neg Hx     Social History   Socioeconomic History   Marital status: Married    Spouse name: Michael   Number of children: 2   Years of education: 16   Highest education level: Not on file  Occupational History   Occupation: retired  Tobacco Use   Smoking status: Former      Subjective:   By signing my name below, I, Carylon Perches, attest that this documentation has been prepared under the direction and in the presence of Ann Held DO 01/02/2022   Patient ID: Cassandra Rios, female    DOB: 1951-04-01, 71 y.o.   MRN: [id number]  Chief Complaint  Patient presents with   Hypertension   Follow-up    HPI Patient is in today for an office visit   She is requesting a refill of 40 Mg of Pantoprazole.   She reports that her blood pressure has been elevating and inconsistent. Her systolic ranges from 071 -219 and her diastolic from 75-883 BP Readings from Last 3 Encounters:  01/02/22 140/90  11/29/21 (!) 178/102  11/25/21 134/70   Pulse Readings from Last 3 Encounters:  01/02/22 86  11/29/21 76  11/25/21 88   She was previously taking 50 Mg of Metropolol which stopped her tremoring but dropped her heart rate to about 40. She is requesting to be prescribed a medication that is short term but effective for 30 days.   She is requesting to get her iron levels checked to see if or when she needs to take her iron supplements.   She is inquiring about whether her platelets levels are normal.  Past Medical History:  Diagnosis Date   AAA (abdominal aortic aneurysm) (HCC)    Allergic rhinitis    Chronic hoarseness    Colon polyps    COPD (chronic obstructive pulmonary disease) ([place])    Dr. Chase Caller   Dyslipidemia    Gastric bleed 10/2021   stomach ulcers   Hypertension    Hypothyroidism    MS (multiple sclerosis) ([place])    Obesity    Stomach ulcer from aspirin/ibuprofen-like drugs (NSAID's)     Past Surgical History:  Procedure Laterality Date   ABDOMINAL HYSTERECTOMY     BARIATRIC SURGERY  09/07/08   lap band   CARPAL TUNNEL RELEASE     CATARACT EXTRACTION Right 11/08/2017   CATARACT EXTRACTION Left 11/22/2017   CATARACT EXTRACTION, BILATERAL      Toric lenses for astigmatism   CHOLECYSTECTOMY     CHOLECYSTECTOMY      ESOPHAGOGASTRODUODENOSCOPY (EGD) WITH PROPOFOL N/A 10/17/2021   Procedure: ESOPHAGOGASTRODUODENOSCOPY (EGD) WITH PROPOFOL;  Surgeon: Arta Silence, MD;  Location: [place];  Service: Gastroenterology;  Laterality: N/A;   HIATAL HERNIA REPAIR  09/07/08   LEFT HEART CATH AND CORONARY ANGIOGRAPHY N/A 07/09/2019   Procedure: LEFT HEART CATH AND CORONARY ANGIOGRAPHY;  Surgeon: Belva Crome, MD;  Location: [facility];  Service: Cardiovascular;  Laterality: N/A;    Family History  Problem Relation Age of Onset   Stroke Mother 58   Heart disease Mother        congenital heart defect?   COPD Father    Heart disease Father    AAA (abdominal aortic aneurysm) Father    COPD Sister    Arthritis Sister    AAA (abdominal aortic aneurysm) Sister    Dementia Sister    Other Sister        brain injury   Multiple sclerosis Neg Hx     Social History   Socioeconomic History   Marital status: Married    Spouse name: Legrand Como   Number of children: 2   Years of education: 16   Highest education level: Not on file  Occupational History   Occupation: retired  Tobacco Use   Smoking status: Former  Subjective:   By signing my name below, I, Carylon Perches, attest that this documentation has been prepared under the direction and in the presence of Ann Held DO 01/02/2022   Patient ID: Cassandra Rios, female    DOB: 1951-04-01, 71 y.o.   MRN: [id number]  Chief Complaint  Patient presents with   Hypertension   Follow-up    HPI Patient is in today for an office visit   She is requesting a refill of 40 Mg of Pantoprazole.   She reports that her blood pressure has been elevating and inconsistent. Her systolic ranges from 071 -219 and her diastolic from 75-883 BP Readings from Last 3 Encounters:  01/02/22 140/90  11/29/21 (!) 178/102  11/25/21 134/70   Pulse Readings from Last 3 Encounters:  01/02/22 86  11/29/21 76  11/25/21 88   She was previously taking 50 Mg of Metropolol which stopped her tremoring but dropped her heart rate to about 40. She is requesting to be prescribed a medication that is short term but effective for 30 days.   She is requesting to get her iron levels checked to see if or when she needs to take her iron supplements.   She is inquiring about whether her platelets levels are normal.  Past Medical History:  Diagnosis Date   AAA (abdominal aortic aneurysm) (HCC)    Allergic rhinitis    Chronic hoarseness    Colon polyps    COPD (chronic obstructive pulmonary disease) ([place])    Dr. Chase Caller   Dyslipidemia    Gastric bleed 10/2021   stomach ulcers   Hypertension    Hypothyroidism    MS (multiple sclerosis) ([place])    Obesity    Stomach ulcer from aspirin/ibuprofen-like drugs (NSAID's)     Past Surgical History:  Procedure Laterality Date   ABDOMINAL HYSTERECTOMY     BARIATRIC SURGERY  09/07/08   lap band   CARPAL TUNNEL RELEASE     CATARACT EXTRACTION Right 11/08/2017   CATARACT EXTRACTION Left 11/22/2017   CATARACT EXTRACTION, BILATERAL      Toric lenses for astigmatism   CHOLECYSTECTOMY     CHOLECYSTECTOMY      ESOPHAGOGASTRODUODENOSCOPY (EGD) WITH PROPOFOL N/A 10/17/2021   Procedure: ESOPHAGOGASTRODUODENOSCOPY (EGD) WITH PROPOFOL;  Surgeon: Arta Silence, MD;  Location: [place];  Service: Gastroenterology;  Laterality: N/A;   HIATAL HERNIA REPAIR  09/07/08   LEFT HEART CATH AND CORONARY ANGIOGRAPHY N/A 07/09/2019   Procedure: LEFT HEART CATH AND CORONARY ANGIOGRAPHY;  Surgeon: Belva Crome, MD;  Location: [facility];  Service: Cardiovascular;  Laterality: N/A;    Family History  Problem Relation Age of Onset   Stroke Mother 58   Heart disease Mother        congenital heart defect?   COPD Father    Heart disease Father    AAA (abdominal aortic aneurysm) Father    COPD Sister    Arthritis Sister    AAA (abdominal aortic aneurysm) Sister    Dementia Sister    Other Sister        brain injury   Multiple sclerosis Neg Hx     Social History   Socioeconomic History   Marital status: Married    Spouse name: Legrand Como   Number of children: 2   Years of education: 16   Highest education level: Not on file  Occupational History   Occupation: retired  Tobacco Use   Smoking status: Former

## 2022-01-02 NOTE — Assessment & Plan Note (Signed)
Poorly controlled will alter medications, encouraged DASH diet, minimize caffeine and obtain adequate sleep. Report concerning symptoms and follow up as directed and as needed con't losartan  Add propranolol due to tremor

## 2022-01-02 NOTE — Assessment & Plan Note (Signed)
Propranolol 10 mg tid  Pt has f/u neuro

## 2022-01-04 DIAGNOSIS — L900 Lichen sclerosus et atrophicus: Secondary | ICD-10-CM | POA: Diagnosis not present

## 2022-01-04 DIAGNOSIS — Z6837 Body mass index (BMI) 37.0-37.9, adult: Secondary | ICD-10-CM | POA: Diagnosis not present

## 2022-01-04 DIAGNOSIS — G35 Multiple sclerosis: Secondary | ICD-10-CM | POA: Diagnosis not present

## 2022-01-04 DIAGNOSIS — E039 Hypothyroidism, unspecified: Secondary | ICD-10-CM | POA: Diagnosis not present

## 2022-01-04 DIAGNOSIS — E785 Hyperlipidemia, unspecified: Secondary | ICD-10-CM | POA: Diagnosis not present

## 2022-01-04 DIAGNOSIS — E669 Obesity, unspecified: Secondary | ICD-10-CM | POA: Diagnosis not present

## 2022-01-04 DIAGNOSIS — I10 Essential (primary) hypertension: Secondary | ICD-10-CM | POA: Diagnosis not present

## 2022-01-04 DIAGNOSIS — M6281 Muscle weakness (generalized): Secondary | ICD-10-CM | POA: Diagnosis not present

## 2022-01-04 DIAGNOSIS — I714 Abdominal aortic aneurysm, without rupture, unspecified: Secondary | ICD-10-CM | POA: Diagnosis not present

## 2022-01-07 NOTE — Progress Notes (Signed)
Cardiology Office Note:    Date:  01/11/2022   ID:  Cassandra Rios, DOB Mar 24, 1951, MRN [id number]  PCP:  [name], DO  Cardiologist:  Lesleigh Noe, MD   Referring MD: Zola Button, Grayling Congress, *   Chief Complaint  Patient presents with   Coronary Artery Disease    Vertigo Nausea and vomiting Orthostasis   Advice Only    Vertigo    History of Present Illness:    Cassandra Rios is a 71 y.o. 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 UGI bleed 10/17/2021.  F/u after starting Jardiance but only took the medication for 3 days and then discontinued it.  For the past 48 hours she has been having a spinning type dizziness associated with nausea and vomiting.  Started suddenly.  The only other time she has felt similar was when she had syncope in May but at that time she was also having abdominal discomfort and was found to have ulcers.  She was admitted to the hospital and had to be transfused.  She has not had melena on this occasion.  She is having no abdominal discomfort as before.  She is just having nausea which seems to be precipitated by the onset of "vertigo".  She has never before had vertigo.  She has had intermittent chest discomfort over the years.  We have done workup with coronary CT as well as coronary angiography.  She does have nonobstructive disease with a 70% LAD.  Chest discomfort is not a part of this particular presentation.  Initially, she was spotted in the lobby complaining of feeling weak.  She was put in the wheelchair and brought upstairs for evaluation.  Initial blood pressures were less than 90 mmHg systolic and a heart rate was 99 bpm.  Given that we brought her upstairs, placed her  supine, gave 500 cc of saline, and she has started feeling better.  She is no longer having vertigo.  She is not nauseated.  The most recent blood pressure while sitting was 130/82 with heart rate in the 60s obtained by me.  Past Medical History:  Diagnosis Date   AAA (abdominal aortic aneurysm) (HCC)    Allergic rhinitis    Chronic hoarseness    Colon polyps    COPD (chronic obstructive pulmonary disease) (HCC)    Dr. Marchelle Gearing   Dyslipidemia    Gastric bleed 10/2021   stomach ulcers   Hypertension    Hypothyroidism    MS (multiple sclerosis) (HCC)    Obesity    Stomach ulcer from aspirin/ibuprofen-like drugs (NSAID's)     Past Surgical History:  Procedure Laterality Date   ABDOMINAL HYSTERECTOMY     BARIATRIC SURGERY  09/07/08   lap band   CARPAL TUNNEL RELEASE     CATARACT EXTRACTION Right 11/08/2017   CATARACT EXTRACTION Left 11/22/2017   CATARACT EXTRACTION, BILATERAL      Toric lenses for astigmatism   CHOLECYSTECTOMY     CHOLECYSTECTOMY     ESOPHAGOGASTRODUODENOSCOPY (EGD) WITH PROPOFOL N/A 10/17/2021   Procedure: ESOPHAGOGASTRODUODENOSCOPY (EGD) WITH PROPOFOL;  Surgeon: Willis Modena, MD;  Location: [facility] ENDOSCOPY;  Service: Gastroenterology;  Laterality: N/A;   HIATAL HERNIA REPAIR  09/07/08   LEFT HEART CATH AND CORONARY ANGIOGRAPHY N/A  07/09/2019   Procedure: LEFT HEART CATH AND CORONARY ANGIOGRAPHY;  Surgeon: Lyn Records, MD;  Location: [facility] INVASIVE CV LAB;  Service: Cardiovascular;  Laterality: N/A;    Current Medications: Current Meds  Medication Sig   meclizine (ANTIVERT) 12.5 MG tablet Take 1 tablet (12.5 mg total) by mouth 3 (three) times daily as needed for dizziness (vertigo).     Allergies:   Codeine, Hydromorphone hcl, Ibuprofen, Morphine sulfate, and Metronidazole   Social History   Socioeconomic History   Marital status: Married    Spouse name: Casimiro Needle   Number of children: 2   Years of education: 16   Highest education level: Not on file   Occupational History   Occupation: retired  Tobacco Use   Smoking status: Former    Packs/day: 2.00    Years: 40.00    Total pack years: 80.00    Types: Cigarettes    Quit date: 04/05/2007    Years since quitting: 14.7    Passive exposure: Current (Husband)   Smokeless tobacco: Never  Vaping Use   Vaping Use: Not on file  Substance and Sexual Activity   Alcohol use: No   Drug use: No   Sexual activity: Never    Partners: Male  Other Topics Concern   Not on file  Social History Narrative   Patient is right handed,reside in home with husband   Social Determinants of Health   Financial Resource Strain: Not on file  Food Insecurity: Not on file  Transportation Needs: Not on file  Physical Activity: Inactive (04/19/2018)   Exercise Vital Sign    Days of Exercise per Week: 0 days    Minutes of Exercise per Session: 0 min  Stress: No Stress Concern Present (04/19/2018)   [employer] of Occupational Health - Occupational Stress Questionnaire    Feeling of Stress : Not at all  Social Connections: Not on file     Family History: The patient's family history includes AAA (abdominal aortic aneurysm) in her father and sister; Arthritis in her sister; COPD in her father and sister; Dementia in her sister; Heart disease in her father and mother; Other in her sister; Stroke (age of onset: 27) in her mother. There is no history of Multiple sclerosis.  ROS:   Please see the history of present illness.    She is wearing shades because of light sensitivity.  She has not had melena similar to May.  She denies abdominal discomfort.  She is not pale.  She has not had syncope.  All other systems reviewed and are negative.  EKGs/Labs/Other Studies Reviewed:    The following studies were reviewed today: ECHOCARDIOGRAPHY 10/18/2021: IMPRESSIONS   1. Left ventricular ejection fraction, by estimation, is 70 to 75%. The  left ventricle has hyperdynamic function. The left ventricle has no   regional wall motion abnormalities. Left ventricular diastolic parameters  are consistent with Grade II diastolic  dysfunction (pseudonormalization). Elevated left atrial pressure.   2. Right ventricular systolic function is normal. The right ventricular  size is normal.   3. The mitral valve is normal in structure. No evidence of mitral valve  regurgitation. No evidence of mitral stenosis.   4. The aortic valve is normal in structure. Aortic valve regurgitation is  not visualized. No aortic stenosis is present.   5. The inferior vena cava is normal in size with <50% respiratory  variability, suggesting right atrial pressure of 8 mmHg.   EKG:  EKG sinus rhythm 70 bpm, right  bundle, otherwise unremarkable and unchanged from the prior tracing in [facility].  Recent Labs: 11/18/2021: TSH 3.00 01/02/2022: ALT 21; BUN 8; Creatinine, Ser 1.04; Hemoglobin 13.6; Platelets 130.0; Potassium 4.7; Sodium 134  Recent Lipid Panel    Component Value Date/Time   CHOL 116 11/18/2021 1018   CHOL 130 08/26/2019 0731   TRIG 96.0 11/18/2021 1018   HDL 49.70 11/18/2021 1018   HDL 47 08/26/2019 0731   CHOLHDL 2 11/18/2021 1018   VLDL 19.2 11/18/2021 1018   LDLCALC 47 11/18/2021 1018   LDLCALC 65 08/26/2019 0731    Physical Exam:    VS:  BP 116/68   Pulse 65   Ht 5\' 6"  (1.676 m)   Wt 215 lb (97.5 kg)   SpO2 98%   BMI 34.70 kg/m     Wt Readings from Last 3 Encounters:  01/11/22 215 lb (97.5 kg)  01/02/22 220 lb (99.8 kg)  11/29/21 226 lb (102.5 kg)     GEN: Obese without pallor. No acute distress HEENT: Normal NECK: No JVD. LYMPHATICS: No lymphadenopathy CARDIAC: No murmur. RRR no gallop, or edema. VASCULAR:  Normal Pulses. No bruits. RESPIRATORY:  Clear to auscultation without rales, wheezing or rhonchi  ABDOMEN: Soft, non-tender, non-distended, No pulsatile mass, MUSCULOSKELETAL: No deformity  SKIN: Warm and dry NEUROLOGIC:  Alert and oriented x 3 PSYCHIATRIC:  Normal affect    ASSESSMENT:    1. Vertigo   2. Nausea and vomiting, unspecified vomiting type   3. Coronary artery disease of native artery of native heart with stable angina pectoris (HCC)   4. Orthostasis   5. Primary hypertension   6. Abdominal aortic atherosclerosis (HCC)   7. Abdominal aortic aneurysm (AAA) without rupture, unspecified part (HCC)   8. Hyperlipidemia LDL goal <70   9. Chronic heart failure with preserved ejection fraction (HCC)   10. Right bundle branch block   11. Dizziness    PLAN:    In order of problems listed above:  Clinically, symptoms of nausea precipitated by episodes of a spinning dizzy feeling (vertigo) seems to be the current issue.  She has also had several bouts of vomiting.  She may be a little dehydrated.  We have given her 500 cc of IV saline.  Blood pressure is now improved.  Start meclizine 12.5 mg every 8 hours as needed vertigo.  Get in to see Dr. Laury Axon is soon as possible.  Related to the vertigo.  Hopefully nausea and vomiting will improve with resolution of vertigo. See #1 above. Blood pressure is low today.  IV fluid has recovered the blood pressure and orthostasis has resolved.  Basic metabolic panel and hemoglobin are being done to exclude dehydration and bleeding (as she had in May) Not discussed No abdominal discomfort Did not discuss Did not discuss No evidence of volume overload.  Neck veins are flat and if anything clinically dry. Unchanged from prior tracing.  No acute changes. Dizziness is felt to be related to vertigo and perhaps a component of orthostasis/dehydration.  See above.   Needs to follow through with Dr. Ewing Schlein with the upper GI endoscopy which is scheduled in 10 days.  Follow-up with Dr. Laury Axon as soon as possible.  If meclizine does not work, Dr. Laury Axon will figure out what to do.   General cardiology follow-up 4 to 6 months.   Medication Adjustments/Labs and Tests Ordered: Current medicines are reviewed at length with the  patient today.  Concerns regarding medicines are outlined above.  Orders Placed This  Encounter  Procedures   Basic Metabolic Panel (BMET)   CBC w/Diff   EKG 12-Lead   Meds ordered this encounter  Medications   meclizine (ANTIVERT) 12.5 MG tablet    Sig: Take 1 tablet (12.5 mg total) by mouth 3 (three) times daily as needed for dizziness (vertigo).    Dispense:  90 tablet    Refill:  6   0.9 %  sodium chloride infusion    Patient Instructions  Medication Instructions:  Your physician has recommended you make the following change in your medication:   1) START Meclizine (Antivert) 12.5mg  may take every 8 hours as needed for dizziness (vertigo)  *If you need a refill on your cardiac medications before your next appointment, please call your pharmacy*  Lab Work: TODAY: CBC, BMET If you have labs (blood work) drawn today and your tests are completely normal, you will receive your results only by: MyChart Message (if you have MyChart) OR A paper copy in the mail If you have any lab test that is abnormal or we need to change your treatment, we will call you to review the results.  Testing/Procedures: NONE  Follow-Up: At [facility], you and your health needs are our priority.  As part of our continuing mission to provide you with exceptional heart care, we have created designated Provider Care Teams.  These Care Teams include your primary Cardiologist (physician) and Advanced Practice Providers (APPs -  Physician Assistants and Nurse Practitioners) who all work together to provide you with the care you need, when you need it.  Your next appointment:   4-6 month(s)  The format for your next appointment:   In Person  Provider:   Lesleigh Noe, MD {  Other Instructions Please see your primary care provider as soon as possible.  Please check your blood pressures at home and call our office at [phone] with an update early next week (Monday August 14th) and also report  readings to your primary care doctor.  Important Information About Sugar         Signed, Lesleigh Noe, MD  01/11/2022 12:52 PM    [facility]

## 2022-01-11 ENCOUNTER — Ambulatory Visit (INDEPENDENT_AMBULATORY_CARE_PROVIDER_SITE_OTHER): Payer: PPO | Admitting: Interventional Cardiology

## 2022-01-11 ENCOUNTER — Encounter: Payer: Self-pay | Admitting: Interventional Cardiology

## 2022-01-11 VITALS — BP 116/68 | HR 65 | Ht 66.0 in | Wt 215.0 lb

## 2022-01-11 DIAGNOSIS — I951 Orthostatic hypotension: Secondary | ICD-10-CM | POA: Diagnosis not present

## 2022-01-11 DIAGNOSIS — I25118 Atherosclerotic heart disease of native coronary artery with other forms of angina pectoris: Secondary | ICD-10-CM | POA: Diagnosis not present

## 2022-01-11 DIAGNOSIS — I10 Essential (primary) hypertension: Secondary | ICD-10-CM | POA: Diagnosis not present

## 2022-01-11 DIAGNOSIS — E785 Hyperlipidemia, unspecified: Secondary | ICD-10-CM | POA: Diagnosis not present

## 2022-01-11 DIAGNOSIS — I5032 Chronic diastolic (congestive) heart failure: Secondary | ICD-10-CM | POA: Diagnosis not present

## 2022-01-11 DIAGNOSIS — R42 Dizziness and giddiness: Secondary | ICD-10-CM

## 2022-01-11 DIAGNOSIS — I4510 Unspecified right bundle-branch block: Secondary | ICD-10-CM

## 2022-01-11 DIAGNOSIS — R112 Nausea with vomiting, unspecified: Secondary | ICD-10-CM | POA: Diagnosis not present

## 2022-01-11 DIAGNOSIS — I714 Abdominal aortic aneurysm, without rupture, unspecified: Secondary | ICD-10-CM

## 2022-01-11 DIAGNOSIS — I700 Atherosclerosis of aorta: Secondary | ICD-10-CM | POA: Diagnosis not present

## 2022-01-11 LAB — CBC WITH DIFFERENTIAL/PLATELET
Basophils Absolute: 0 10*3/uL (ref 0.0–0.2)
Basos: 0 %
EOS (ABSOLUTE): 0.1 10*3/uL (ref 0.0–0.4)
Eos: 1 %
Hematocrit: 43.8 % (ref 34.0–46.6)
Hemoglobin: 14.9 g/dL (ref 11.1–15.9)
Lymphocytes Absolute: 1.6 10*3/uL (ref 0.7–3.1)
Lymphs: 20 %
MCH: 29.3 pg (ref 26.6–33.0)
MCHC: 34 g/dL (ref 31.5–35.7)
MCV: 86 fL (ref 79–97)
Monocytes Absolute: 0.7 10*3/uL (ref 0.1–0.9)
Monocytes: 9 %
Neutrophils Absolute: 5.4 10*3/uL (ref 1.4–7.0)
Neutrophils: 70 %
Platelets: 139 10*3/uL — ABNORMAL LOW (ref 150–450)
RBC: 5.08 x10E6/uL (ref 3.77–5.28)
RDW: 14.5 % (ref 11.7–15.4)
WBC: 7.8 10*3/uL (ref 3.4–10.8)

## 2022-01-11 LAB — BASIC METABOLIC PANEL
BUN/Creatinine Ratio: 9 — ABNORMAL LOW (ref 12–28)
BUN: 12 mg/dL (ref 8–27)
CO2: 24 mmol/L (ref 20–29)
Calcium: 10.2 mg/dL (ref 8.7–10.3)
Chloride: 96 mmol/L (ref 96–106)
Creatinine, Ser: 1.3 mg/dL — ABNORMAL HIGH (ref 0.57–1.00)
Glucose: 135 mg/dL — ABNORMAL HIGH (ref 70–99)
Potassium: 4.5 mmol/L (ref 3.5–5.2)
Sodium: 131 mmol/L — ABNORMAL LOW (ref 134–144)
eGFR: 44 mL/min/{1.73_m2} — ABNORMAL LOW (ref >59)

## 2022-01-11 MED ORDER — MECLIZINE HCL 12.5 MG PO TABS: 12.5000 mg | ORAL_TABLET | Freq: Three times a day (TID) | ORAL | 6 refills | Status: DC | PRN

## 2022-01-11 MED ORDER — SODIUM CHLORIDE 0.9 % IV SOLN
Freq: Once | INTRAVENOUS | Status: AC
  Administered 2022-01-11: 500 mL via INTRAVENOUS

## 2022-01-11 NOTE — Patient Instructions (Signed)
Medication Instructions:  Your physician has recommended you make the following change in your medication:   1) START Meclizine (Antivert) 12.'5mg'$  may take every 8 hours as needed for dizziness (vertigo)  *If you need a refill on your cardiac medications before your next appointment, please call your pharmacy*  Lab Work: TODAY: CBC, BMET If you have labs (blood work) drawn today and your tests are completely normal, you will receive your results only by: [place] (if you have MyChart) OR A paper copy in the mail If you have any lab test that is abnormal or we need to change your treatment, we will call you to review the results.  Testing/Procedures: NONE  Follow-Up: At [facility], you and your health needs are our priority.  As part of our continuing mission to provide you with exceptional heart care, we have created designated Provider Care Teams.  These Care Teams include your primary Cardiologist (physician) and Advanced Practice Providers (APPs -  Physician Assistants and Nurse Practitioners) who all work together to provide you with the care you need, when you need it.  Your next appointment:   4-6 month(s)  The format for your next appointment:   In Person  Provider:   Sinclair Grooms, MD {  Other Instructions Please see your primary care provider as soon as possible.  Please check your blood pressures at home and call our office at [phone] with an update early next week (Monday August 14th) and also report readings to your primary care doctor.  Important Information About Sugar

## 2022-01-12 ENCOUNTER — Encounter: Payer: Self-pay | Admitting: Family Medicine

## 2022-01-12 ENCOUNTER — Other Ambulatory Visit: Payer: Self-pay | Admitting: Family Medicine

## 2022-01-12 ENCOUNTER — Ambulatory Visit (INDEPENDENT_AMBULATORY_CARE_PROVIDER_SITE_OTHER): Payer: PPO | Admitting: Family Medicine

## 2022-01-12 VITALS — BP 118/70 | HR 75 | Temp 98.1°F | Resp 20 | Ht 66.0 in | Wt 218.0 lb

## 2022-01-12 DIAGNOSIS — R112 Nausea with vomiting, unspecified: Secondary | ICD-10-CM

## 2022-01-12 LAB — POC URINALSYSI DIPSTICK (AUTOMATED)
Bilirubin, UA: NEGATIVE
Blood, UA: NEGATIVE
Glucose, UA: NEGATIVE
Ketones, UA: NEGATIVE
Nitrite, UA: NEGATIVE
Protein, UA: NEGATIVE
Spec Grav, UA: 1.01 (ref 1.010–1.025)
Urobilinogen, UA: 0.2 E.U./dL
pH, UA: 6 (ref 5.0–8.0)

## 2022-01-12 MED ORDER — ONDANSETRON HCL 4 MG PO TABS: 4.0000 mg | ORAL_TABLET | Freq: Three times a day (TID) | ORAL | 0 refills | Status: DC | PRN

## 2022-01-12 MED ORDER — PROMETHAZINE HCL 25 MG/ML IJ SOLN
50.0000 mg | Freq: Once | INTRAMUSCULAR | Status: AC
  Administered 2022-01-12: 50 mg via INTRAMUSCULAR

## 2022-01-12 NOTE — Progress Notes (Signed)
Subjective:   By signing my name below, I, Cassandra Rios, attest that this documentation has been prepared under the direction and in the presence of [name], DO  01/12/2022    Patient ID: Cassandra Rios, female    DOB: 05-30-1951, 71 y.o.   MRN: [id number]  Chief Complaint  Patient presents with   Dizziness    Pt states not feeling good on Tuesday and rested most of the day. Pt states getting sick at Dr office yesterday. Pt was given fluids in office and felt better. Pt states taking Antivert yesterday afternoon and then took 4am and then 1 about hour ago. Pt states feel nauseous, weak, and dizzy    Dizziness Associated symptoms include nausea and vomiting.   Patient is in today for a office visit.   She complains of nausea, dizziness, vomiting and weakness since yesterday. She seen another provider yesterday and found she was dehydrated. She was given Antivert yesterday and has taken 2 since. She is urinating less frequently.    Past Medical History:  Diagnosis Date   AAA (abdominal aortic aneurysm) (HCC)    Allergic rhinitis    Chronic hoarseness    Colon polyps    COPD (chronic obstructive pulmonary disease) (HCC)    Dr. Marchelle Gearing   Dyslipidemia    Gastric bleed 10/2021   stomach ulcers   Hypertension    Hypothyroidism    MS (multiple sclerosis) (HCC)    Obesity    Stomach ulcer from aspirin/ibuprofen-like drugs (NSAID's)     Past Surgical History:  Procedure Laterality Date   ABDOMINAL HYSTERECTOMY     BARIATRIC SURGERY  09/07/08   lap band   CARPAL TUNNEL RELEASE     CATARACT EXTRACTION Right 11/08/2017   CATARACT EXTRACTION Left 11/22/2017   CATARACT EXTRACTION, BILATERAL      Toric lenses for astigmatism   CHOLECYSTECTOMY     CHOLECYSTECTOMY     ESOPHAGOGASTRODUODENOSCOPY (EGD) WITH PROPOFOL N/A 10/17/2021   Procedure: ESOPHAGOGASTRODUODENOSCOPY (EGD) WITH PROPOFOL;  Surgeon: Willis Modena, MD;  Location: [facility] ENDOSCOPY;  Service:  Gastroenterology;  Laterality: N/A;   HIATAL HERNIA REPAIR  09/07/08   LEFT HEART CATH AND CORONARY ANGIOGRAPHY N/A 07/09/2019   Procedure: LEFT HEART CATH AND CORONARY ANGIOGRAPHY;  Surgeon: Lyn Records, MD;  Location: [facility] INVASIVE CV LAB;  Service: Cardiovascular;  Laterality: N/A;    Family History  Problem Relation Age of Onset   Stroke Mother 76   Heart disease Mother        congenital heart defect?   COPD Father    Heart disease Father    AAA (abdominal aortic aneurysm) Father    COPD Sister    Arthritis Sister    AAA (abdominal aortic aneurysm) Sister    Dementia Sister    Other Sister        brain injury   Multiple sclerosis Neg Hx     Social History   Socioeconomic History   Marital status: Married    Spouse name: Casimiro Needle   Number of children: 2   Years of education: 16   Highest education level: Not on file  Occupational History   Occupation: retired  Tobacco Use   Smoking status: Former    Packs/day: 2.00    Years: 40.00    Total pack years: 80.00    Types: Cigarettes    Quit date: 04/05/2007    Years since quitting: 14.7    Passive exposure: Current (Husband)  Smokeless tobacco: Never  Vaping Use   Vaping Use: Not on file  Substance and Sexual Activity   Alcohol use: No   Drug use: No   Sexual activity: Never    Partners: Male  Other Topics Concern   Not on file  Social History Narrative   Patient is right handed,reside in home with husband   Social Determinants of Health   Financial Resource Strain: Not on file  Food Insecurity: Not on file  Transportation Needs: Not on file  Physical Activity: Inactive (04/19/2018)   Exercise Vital Sign    Days of Exercise per Week: 0 days    Minutes of Exercise per Session: 0 min  Stress: No Stress Concern Present (04/19/2018)   [employer] of Occupational Health - Occupational Stress Questionnaire    Feeling of Stress : Not at all  Social Connections: Not on file  Intimate Partner Violence: Not  on file    Outpatient Medications Prior to Visit  Medication Sig Dispense Refill   acetaminophen (TYLENOL) 500 MG tablet Take 1,000 mg by mouth every 6 (six) hours as needed for moderate pain or headache.     atorvastatin (LIPITOR) 80 MG tablet Take 1 tablet (80 mg total) by mouth daily. 90 tablet 3   clobetasol ointment (TEMOVATE) 0.05 % Apply topically 2 (two) times daily.     FLUoxetine (PROZAC) 40 MG capsule Take 1 capsule (40 mg total) by mouth daily. 90 capsule 3   gabapentin (NEURONTIN) 300 MG capsule Take 600 mg by mouth every evening.      Interferon Beta-1b (BETASERON/EXTAVIA) 0.3 MG KIT injection Inject 0.25 mg into the skin every other day.     levothyroxine (SYNTHROID) 112 MCG tablet TAKE 1 TABLET BY MOUTH EVERY DAY BEFORE BREAKFAST 90 tablet 1   losartan (COZAAR) 100 MG tablet Take 1 tablet (100 mg total) by mouth daily. 90 tablet 1   meclizine (ANTIVERT) 12.5 MG tablet Take 1 tablet (12.5 mg total) by mouth 3 (three) times daily as needed for dizziness (vertigo). 90 tablet 6   mometasone (NASONEX) 50 MCG/ACT nasal spray Place 2 sprays into the nose daily as needed (allergies).     pantoprazole (PROTONIX) 40 MG tablet Take 1 tablet (40 mg total) by mouth 2 (two) times daily before a meal. 180 tablet 3   Polyethyl Glycol-Propyl Glycol (SYSTANE ULTRA OP) Place 1 drop into the right eye daily as needed (dry eye).      propranolol (INDERAL) 10 MG tablet Take 1 tablet (10 mg total) by mouth 3 (three) times daily. 90 tablet 0   silver sulfADIAZINE (SILVADENE) 1 % cream Apply 1 application. topically daily as needed (irritated areas under abdomen).     triamcinolone ointment (KENALOG) 0.1 % Apply 1 application. topically daily as needed (rash).     hydrochlorothiazide (HYDRODIURIL) 25 MG tablet Take 1 tablet (25 mg total) by mouth daily. 30 tablet 0   nitroGLYCERIN (NITROSTAT) 0.4 MG SL tablet Place 1 tablet (0.4 mg total) under the tongue every 5 (five) minutes as needed for chest pain.  (Patient not taking: Reported on 11/29/2021) 30 tablet 3   No facility-administered medications prior to visit.    Allergies  Allergen Reactions   Codeine Itching and Nausea And Vomiting   Hydromorphone Hcl Nausea And Vomiting   Ibuprofen Other (See Comments)    stomach ulcers   Morphine Sulfate     "makes my blood pressure bottom out"   Metronidazole Hives, Nausea And Vomiting and Rash  Review of Systems  Constitutional:  Positive for malaise/fatigue.  Gastrointestinal:  Positive for nausea and vomiting.  Neurological:  Positive for dizziness.       Objective:    Physical Exam Constitutional:      General: She is not in acute distress.    Appearance: Normal appearance. She is not ill-appearing.  HENT:     Head: Normocephalic and atraumatic.     Right Ear: External ear normal.     Left Ear: External ear normal.  Eyes:     Extraocular Movements: Extraocular movements intact.     Pupils: Pupils are equal, round, and reactive to light.  Cardiovascular:     Rate and Rhythm: Normal rate and regular rhythm.     Heart sounds: Normal heart sounds. No murmur heard.    No gallop.  Pulmonary:     Effort: Pulmonary effort is normal. No respiratory distress.     Breath sounds: Normal breath sounds. No wheezing or rales.  Abdominal:     Palpations: Abdomen is soft.     Tenderness: There is abdominal tenderness (mild) in the epigastric area.  Skin:    General: Skin is warm and dry.  Neurological:     Mental Status: She is alert and oriented to person, place, and time.  Psychiatric:        Judgment: Judgment normal.     BP 118/70 (BP Location: Left Arm, Patient Position: Sitting, Cuff Size: Large)   Pulse 75   Temp 98.1 F (36.7 C) (Oral)   Resp 20   Ht 5\' 6"  (1.676 m)   Wt 218 lb (98.9 kg)   SpO2 99%   BMI 35.19 kg/m  Wt Readings from Last 3 Encounters:  01/12/22 218 lb (98.9 kg)  01/11/22 215 lb (97.5 kg)  01/02/22 220 lb (99.8 kg)    Diabetic Foot Exam -  Simple   No data filed    Lab Results  Component Value Date   WBC 7.8 01/11/2022   HGB 14.9 01/11/2022   HCT 43.8 01/11/2022   PLT 139 (L) 01/11/2022   GLUCOSE 135 (H) 01/11/2022   CHOL 116 11/18/2021   TRIG 96.0 11/18/2021   HDL 49.70 11/18/2021   LDLCALC 47 11/18/2021   ALT 21 01/02/2022   AST 39 (H) 01/02/2022   NA 131 (L) 01/11/2022   K 4.5 01/11/2022   CL 96 01/11/2022   CREATININE 1.30 (H) 01/11/2022   BUN 12 01/11/2022   CO2 24 01/11/2022   TSH 3.00 11/18/2021   INR 1.1 10/17/2021   HGBA1C 5.9 09/25/2016   MICROALBUR 2.5 (H) 09/15/2014    Lab Results  Component Value Date   TSH 3.00 11/18/2021   Lab Results  Component Value Date   WBC 7.8 01/11/2022   HGB 14.9 01/11/2022   HCT 43.8 01/11/2022   MCV 86 01/11/2022   PLT 139 (L) 01/11/2022   Lab Results  Component Value Date   NA 131 (L) 01/11/2022   K 4.5 01/11/2022   CO2 24 01/11/2022   GLUCOSE 135 (H) 01/11/2022   BUN 12 01/11/2022   CREATININE 1.30 (H) 01/11/2022   BILITOT 0.5 01/02/2022   ALKPHOS 84 01/02/2022   AST 39 (H) 01/02/2022   ALT 21 01/02/2022   PROT 6.9 01/02/2022   ALBUMIN 4.0 01/02/2022   CALCIUM 10.2 01/11/2022   ANIONGAP 6 10/18/2021   EGFR 44 (L) 01/11/2022   GFR 53.92 (L) 01/02/2022   Lab Results  Component Value Date   CHOL  116 11/18/2021   Lab Results  Component Value Date   HDL 49.70 11/18/2021   Lab Results  Component Value Date   LDLCALC 47 11/18/2021   Lab Results  Component Value Date   TRIG 96.0 11/18/2021   Lab Results  Component Value Date   CHOLHDL 2 11/18/2021   Lab Results  Component Value Date   HGBA1C 5.9 09/25/2016       Assessment & Plan:   Problem List Items Addressed This Visit   None Visit Diagnoses     Nausea and vomiting, unspecified vomiting type    -  Primary   Relevant Medications   ondansetron (ZOFRAN) 4 MG tablet   promethazine (PHENERGAN) injection 50 mg (Completed)   Other Relevant Orders   CBC with  Differential/Platelet   Comprehensive metabolic panel   POCT Urinalysis Dipstick (Automated) (Completed)   Urine Culture     If vomiting cont after phenergan ---  go to er Pedialyte ice pops/ gatorade  Brat diet    Meds ordered this encounter  Medications   ondansetron (ZOFRAN) 4 MG tablet    Sig: Take 1 tablet (4 mg total) by mouth every 8 (eight) hours as needed for nausea or vomiting.    Dispense:  20 tablet    Refill:  0   promethazine (PHENERGAN) injection 50 mg    I, [name], DO, personally preformed the services described in this documentation.  All medical record entries made by the scribe were at my direction and in my presence.  I have reviewed the chart and discharge instructions (if applicable) and agree that the record reflects my personal performance and is accurate and complete. 01/12/2022   I,Cassandra Rios,acting as a scribe for [name], DO.,have documented all relevant documentation on the behalf of [name], DO,as directed by  [name], DO while in the presence of [name], DO.   [name], DO

## 2022-01-12 NOTE — Patient Instructions (Signed)
Viral Gastroenteritis, Adult  Viral gastroenteritis is also known as the stomach flu. This condition may affect your stomach, your small intestine, and your large intestine. It can cause sudden watery poop (diarrhea), fever, and vomiting. This condition is caused by certain germs (viruses). These germs can be passed from person to person very easily (are contagious). Having watery poop and vomiting can make you feel weak and cause you to not have enough water in your body (get dehydrated). This can make you tired and thirsty, make you have a dry mouth, and make it so you pee (urinate) less often. It is important to replace the fluids that you lose from having watery poop and vomiting. What are the causes? You can get sick by catching germs from other people. You can also get sick by: Eating food, drinking water, or touching a surface that has the germs on it (is contaminated). Sharing utensils or other personal items with a person who is sick. What increases the risk? Having a weak body defense system (immune system). Living with one or more children who are younger than 2 years. Living in a nursing home. Going on cruise ships. What are the signs or symptoms? Symptoms of this condition start suddenly. Symptoms may last for a few days or for as long as a week. Common symptoms include: Watery poop. Vomiting. Other symptoms include: Fever. Headache. Feeling tired (fatigue). Pain in the belly (abdomen). Chills. Feeling weak. Feeling like you may vomit (nauseous). Muscle aches. Not feeling hungry. How is this treated? This condition typically goes away on its own. The focus of treatment is to replace the fluids that you lose. This condition may be treated with: An ORS (oral rehydration solution). This is a drink that helps you replace fluids and minerals your body lost. It is sold at pharmacies and stores. Medicines to help with your symptoms. Probiotic supplements to reduce symptoms of  watery poop. Fluids given through an IV tube, if needed. Older adults and people with other diseases or a weak body defense system are at higher risk for not having enough water in the body. Follow these instructions at home: Eating and drinking  Take an ORS as told by your doctor. Drink clear fluids in small amounts as you are able. Clear fluids include: Water. Ice chips. Fruit juice that has water added to it (is diluted). Low-calorie sports drinks. Drink enough fluid to keep your pee (urine) pale yellow. Eat small amounts of healthy foods every 3-4 hours as you are able. This may include whole grains, fruits, vegetables, lean meats, and yogurt. Avoid fluids that have a lot of sugar or caffeine in them. This includes energy drinks, sports drinks, and soda. Avoid spicy or fatty foods. Avoid alcohol. General instructions  Wash your hands often. This is very important after you have watery poop or you vomit. If you cannot use soap and water, use hand sanitizer. Make sure that all people in your home wash their hands well and often. Take over-the-counter and prescription medicines only as told by your doctor. Rest at home while you get better. Watch your condition for any changes. Take a warm bath to help with any burning or pain from having watery poop. Keep all follow-up visits. Contact a doctor if: You cannot keep fluids down. Your symptoms get worse. You have new symptoms. You feel light-headed or dizzy. You have muscle cramps. Get help right away if: You have chest pain. You have trouble breathing, or you are breathing very fast.   You have a fast heartbeat. You feel very weak or you faint. You have a very bad headache, a stiff neck, or both. You have a rash. You have very bad pain, cramping, or bloating in your belly. Your skin feels cold and clammy. You feel mixed up (confused). You have pain when you pee. You have signs of not having enough water in the body, such  as: Dark pee, hardly any pee, or no pee. Cracked lips. Dry mouth. Sunken eyes. Feeling very sleepy. Feeling weak. You have signs of bleeding, such as: You see blood in your vomit. Your vomit looks like coffee grounds. You have bloody or black poop or poop that looks like tar. These symptoms may be an emergency. Get help right away. Call 911. Do not wait to see if the symptoms will go away. Do not drive yourself to the hospital. Summary Viral gastroenteritis is also known as the stomach flu. This condition can cause sudden watery poop (diarrhea), fever, and vomiting. These germs can be passed from person to person very easily. Take an ORS (oral rehydration solution) as told by your doctor. This is a drink that is sold at pharmacies and stores. Wash your hands often, especially after having watery poop or vomiting. If you cannot use soap and water, use hand sanitizer. This information is not intended to replace advice given to you by your health care provider. Make sure you discuss any questions you have with your health care provider. Document Revised: 03/21/2021 Document Reviewed: 03/21/2021 Elsevier Patient Education  2023 [employer] Inc.  

## 2022-01-13 LAB — URINE CULTURE
MICRO NUMBER:: 13762058
SPECIMEN QUALITY:: ADEQUATE

## 2022-01-13 LAB — COMPREHENSIVE METABOLIC PANEL
ALT: 19 U/L (ref 0–35)
AST: 36 U/L (ref 0–37)
Albumin: 3.9 g/dL (ref 3.5–5.2)
Alkaline Phosphatase: 89 U/L (ref 39–117)
BUN: 14 mg/dL (ref 6–23)
CO2: 26 mEq/L (ref 19–32)
Calcium: 9.3 mg/dL (ref 8.4–10.5)
Chloride: 95 mEq/L — ABNORMAL LOW (ref 96–112)
Creatinine, Ser: 1.32 mg/dL — ABNORMAL HIGH (ref 0.40–1.20)
GFR: 40.5 mL/min — ABNORMAL LOW (ref >60.00)
Glucose, Bld: 117 mg/dL — ABNORMAL HIGH (ref 70–99)
Potassium: 4.3 mEq/L (ref 3.5–5.1)
Sodium: 131 mEq/L — ABNORMAL LOW (ref 135–145)
Total Bilirubin: 0.4 mg/dL (ref 0.2–1.2)
Total Protein: 6.6 g/dL (ref 6.0–8.3)

## 2022-01-13 LAB — CBC WITH DIFFERENTIAL/PLATELET
Basophils Absolute: 0 10*3/uL (ref 0.0–0.1)
Basophils Relative: 0.6 % (ref 0.0–3.0)
Eosinophils Absolute: 0.1 10*3/uL (ref 0.0–0.7)
Eosinophils Relative: 1.6 % (ref 0.0–5.0)
HCT: 42.5 % (ref 36.0–46.0)
Hemoglobin: 14 g/dL (ref 12.0–15.0)
Lymphocytes Relative: 19.8 % (ref 12.0–46.0)
Lymphs Abs: 1.4 10*3/uL (ref 0.7–4.0)
MCHC: 33 g/dL (ref 30.0–36.0)
MCV: 87.3 fl (ref 78.0–100.0)
Monocytes Absolute: 0.7 10*3/uL (ref 0.1–1.0)
Monocytes Relative: 9 % (ref 3.0–12.0)
Neutro Abs: 5 10*3/uL (ref 1.4–7.7)
Neutrophils Relative %: 69 % (ref 43.0–77.0)
Platelets: 153 10*3/uL (ref 150.0–400.0)
RBC: 4.86 Mil/uL (ref 3.87–5.11)
RDW: 13.6 % (ref 11.5–15.5)
WBC: 7.3 10*3/uL (ref 4.0–10.5)

## 2022-01-14 ENCOUNTER — Other Ambulatory Visit: Payer: Self-pay | Admitting: Family Medicine

## 2022-01-14 DIAGNOSIS — R112 Nausea with vomiting, unspecified: Secondary | ICD-10-CM

## 2022-01-16 ENCOUNTER — Other Ambulatory Visit: Payer: Self-pay | Admitting: Family Medicine

## 2022-01-16 DIAGNOSIS — R112 Nausea with vomiting, unspecified: Secondary | ICD-10-CM

## 2022-01-17 ENCOUNTER — Telehealth: Payer: Self-pay | Admitting: Family Medicine

## 2022-01-17 NOTE — Telephone Encounter (Signed)
Caller/Agency: [facility]) Callback Number: [phone] Requesting OT/PT/Skilled Nursing/Social Work/Speech Therapy: PT - Vestibular  Frequency: 1 w 4

## 2022-01-18 NOTE — Telephone Encounter (Signed)
Verbal given 

## 2022-01-20 ENCOUNTER — Other Ambulatory Visit (INDEPENDENT_AMBULATORY_CARE_PROVIDER_SITE_OTHER): Payer: PPO

## 2022-01-20 DIAGNOSIS — R112 Nausea with vomiting, unspecified: Secondary | ICD-10-CM

## 2022-01-20 LAB — BASIC METABOLIC PANEL
BUN: 12 mg/dL (ref 6–23)
CO2: 30 mEq/L (ref 19–32)
Calcium: 9.1 mg/dL (ref 8.4–10.5)
Chloride: 94 mEq/L — ABNORMAL LOW (ref 96–112)
Creatinine, Ser: 1.12 mg/dL (ref 0.40–1.20)
GFR: 49.32 mL/min — ABNORMAL LOW (ref >60.00)
Glucose, Bld: 102 mg/dL — ABNORMAL HIGH (ref 70–99)
Potassium: 3.8 mEq/L (ref 3.5–5.1)
Sodium: 133 mEq/L — ABNORMAL LOW (ref 135–145)

## 2022-01-21 ENCOUNTER — Other Ambulatory Visit: Payer: Self-pay | Admitting: Family Medicine

## 2022-01-24 DIAGNOSIS — K2950 Unspecified chronic gastritis without bleeding: Secondary | ICD-10-CM | POA: Diagnosis not present

## 2022-01-24 DIAGNOSIS — K319 Disease of stomach and duodenum, unspecified: Secondary | ICD-10-CM | POA: Diagnosis not present

## 2022-01-24 DIAGNOSIS — K449 Diaphragmatic hernia without obstruction or gangrene: Secondary | ICD-10-CM | POA: Diagnosis not present

## 2022-01-24 DIAGNOSIS — K253 Acute gastric ulcer without hemorrhage or perforation: Secondary | ICD-10-CM | POA: Diagnosis not present

## 2022-01-26 DIAGNOSIS — K319 Disease of stomach and duodenum, unspecified: Secondary | ICD-10-CM | POA: Diagnosis not present

## 2022-01-28 ENCOUNTER — Other Ambulatory Visit: Payer: Self-pay | Admitting: Family Medicine

## 2022-01-28 DIAGNOSIS — R251 Tremor, unspecified: Secondary | ICD-10-CM

## 2022-02-07 DIAGNOSIS — G35 Multiple sclerosis: Secondary | ICD-10-CM | POA: Diagnosis not present

## 2022-02-07 DIAGNOSIS — E039 Hypothyroidism, unspecified: Secondary | ICD-10-CM | POA: Diagnosis not present

## 2022-02-07 DIAGNOSIS — E785 Hyperlipidemia, unspecified: Secondary | ICD-10-CM | POA: Diagnosis not present

## 2022-02-07 DIAGNOSIS — M6281 Muscle weakness (generalized): Secondary | ICD-10-CM | POA: Diagnosis not present

## 2022-02-07 DIAGNOSIS — E669 Obesity, unspecified: Secondary | ICD-10-CM | POA: Diagnosis not present

## 2022-02-07 DIAGNOSIS — I714 Abdominal aortic aneurysm, without rupture, unspecified: Secondary | ICD-10-CM | POA: Diagnosis not present

## 2022-02-07 DIAGNOSIS — Z6837 Body mass index (BMI) 37.0-37.9, adult: Secondary | ICD-10-CM | POA: Diagnosis not present

## 2022-02-07 DIAGNOSIS — I10 Essential (primary) hypertension: Secondary | ICD-10-CM | POA: Diagnosis not present

## 2022-02-07 DIAGNOSIS — L900 Lichen sclerosus et atrophicus: Secondary | ICD-10-CM | POA: Diagnosis not present

## 2022-02-14 ENCOUNTER — Emergency Department (HOSPITAL_BASED_OUTPATIENT_CLINIC_OR_DEPARTMENT_OTHER): Payer: PPO

## 2022-02-14 ENCOUNTER — Encounter (HOSPITAL_BASED_OUTPATIENT_CLINIC_OR_DEPARTMENT_OTHER): Payer: Self-pay | Admitting: Obstetrics and Gynecology

## 2022-02-14 ENCOUNTER — Inpatient Hospital Stay (HOSPITAL_BASED_OUTPATIENT_CLINIC_OR_DEPARTMENT_OTHER)
Admission: EM | Admit: 2022-02-14 | Discharge: 2022-02-17 | DRG: 392 | Disposition: A | Discharge status: Home Health | Payer: PPO | Attending: Internal Medicine | Admitting: Internal Medicine

## 2022-02-14 ENCOUNTER — Encounter (HOSPITAL_COMMUNITY): Payer: Self-pay

## 2022-02-14 ENCOUNTER — Other Ambulatory Visit: Payer: Self-pay

## 2022-02-14 DIAGNOSIS — Z885 Allergy status to narcotic agent status: Secondary | ICD-10-CM

## 2022-02-14 DIAGNOSIS — I110 Hypertensive heart disease with heart failure: Secondary | ICD-10-CM | POA: Diagnosis not present

## 2022-02-14 DIAGNOSIS — I5032 Chronic diastolic (congestive) heart failure: Secondary | ICD-10-CM | POA: Diagnosis not present

## 2022-02-14 DIAGNOSIS — Z79899 Other long term (current) drug therapy: Secondary | ICD-10-CM | POA: Diagnosis not present

## 2022-02-14 DIAGNOSIS — K922 Gastrointestinal hemorrhage, unspecified: Secondary | ICD-10-CM | POA: Diagnosis not present

## 2022-02-14 DIAGNOSIS — Z9049 Acquired absence of other specified parts of digestive tract: Secondary | ICD-10-CM

## 2022-02-14 DIAGNOSIS — A084 Viral intestinal infection, unspecified: Principal | ICD-10-CM | POA: Diagnosis present

## 2022-02-14 DIAGNOSIS — Z6834 Body mass index (BMI) 34.0-34.9, adult: Secondary | ICD-10-CM

## 2022-02-14 DIAGNOSIS — N179 Acute kidney failure, unspecified: Secondary | ICD-10-CM | POA: Diagnosis not present

## 2022-02-14 DIAGNOSIS — E669 Obesity, unspecified: Secondary | ICD-10-CM | POA: Diagnosis not present

## 2022-02-14 DIAGNOSIS — F32A Depression, unspecified: Secondary | ICD-10-CM | POA: Diagnosis not present

## 2022-02-14 DIAGNOSIS — F418 Other specified anxiety disorders: Secondary | ICD-10-CM | POA: Diagnosis present

## 2022-02-14 DIAGNOSIS — M549 Dorsalgia, unspecified: Secondary | ICD-10-CM | POA: Diagnosis not present

## 2022-02-14 DIAGNOSIS — I7143 Infrarenal abdominal aortic aneurysm, without rupture: Secondary | ICD-10-CM | POA: Diagnosis present

## 2022-02-14 DIAGNOSIS — Z8719 Personal history of other diseases of the digestive system: Secondary | ICD-10-CM

## 2022-02-14 DIAGNOSIS — Z825 Family history of asthma and other chronic lower respiratory diseases: Secondary | ICD-10-CM

## 2022-02-14 DIAGNOSIS — F419 Anxiety disorder, unspecified: Secondary | ICD-10-CM | POA: Diagnosis present

## 2022-02-14 DIAGNOSIS — Z87891 Personal history of nicotine dependence: Secondary | ICD-10-CM | POA: Diagnosis not present

## 2022-02-14 DIAGNOSIS — Z8249 Family history of ischemic heart disease and other diseases of the circulatory system: Secondary | ICD-10-CM

## 2022-02-14 DIAGNOSIS — E66811 Obesity, class 1: Secondary | ICD-10-CM | POA: Diagnosis present

## 2022-02-14 DIAGNOSIS — E039 Hypothyroidism, unspecified: Secondary | ICD-10-CM | POA: Diagnosis present

## 2022-02-14 DIAGNOSIS — Z86010 Personal history of colonic polyps: Secondary | ICD-10-CM

## 2022-02-14 DIAGNOSIS — J449 Chronic obstructive pulmonary disease, unspecified: Secondary | ICD-10-CM | POA: Diagnosis not present

## 2022-02-14 DIAGNOSIS — I8001 Phlebitis and thrombophlebitis of superficial vessels of right lower extremity: Secondary | ICD-10-CM | POA: Diagnosis not present

## 2022-02-14 DIAGNOSIS — D696 Thrombocytopenia, unspecified: Secondary | ICD-10-CM | POA: Diagnosis not present

## 2022-02-14 DIAGNOSIS — I2510 Atherosclerotic heart disease of native coronary artery without angina pectoris: Secondary | ICD-10-CM | POA: Diagnosis present

## 2022-02-14 DIAGNOSIS — E860 Dehydration: Secondary | ICD-10-CM | POA: Diagnosis present

## 2022-02-14 DIAGNOSIS — E876 Hypokalemia: Secondary | ICD-10-CM | POA: Diagnosis not present

## 2022-02-14 DIAGNOSIS — Z8711 Personal history of peptic ulcer disease: Secondary | ICD-10-CM

## 2022-02-14 DIAGNOSIS — Z20822 Contact with and (suspected) exposure to covid-19: Secondary | ICD-10-CM | POA: Diagnosis not present

## 2022-02-14 DIAGNOSIS — Z79890 Hormone replacement therapy (postmenopausal): Secondary | ICD-10-CM

## 2022-02-14 DIAGNOSIS — J309 Allergic rhinitis, unspecified: Secondary | ICD-10-CM | POA: Diagnosis present

## 2022-02-14 DIAGNOSIS — Z91148 Patient's other noncompliance with medication regimen for other reason: Secondary | ICD-10-CM

## 2022-02-14 DIAGNOSIS — K529 Noninfective gastroenteritis and colitis, unspecified: Secondary | ICD-10-CM | POA: Diagnosis not present

## 2022-02-14 DIAGNOSIS — I5030 Unspecified diastolic (congestive) heart failure: Secondary | ICD-10-CM | POA: Diagnosis present

## 2022-02-14 DIAGNOSIS — R112 Nausea with vomiting, unspecified: Secondary | ICD-10-CM | POA: Diagnosis not present

## 2022-02-14 DIAGNOSIS — Z90710 Acquired absence of both cervix and uterus: Secondary | ICD-10-CM

## 2022-02-14 DIAGNOSIS — G35 Multiple sclerosis: Secondary | ICD-10-CM | POA: Diagnosis present

## 2022-02-14 DIAGNOSIS — Z881 Allergy status to other antibiotic agents status: Secondary | ICD-10-CM

## 2022-02-14 DIAGNOSIS — G8929 Other chronic pain: Secondary | ICD-10-CM | POA: Diagnosis not present

## 2022-02-14 DIAGNOSIS — E785 Hyperlipidemia, unspecified: Secondary | ICD-10-CM | POA: Diagnosis not present

## 2022-02-14 DIAGNOSIS — Z886 Allergy status to analgesic agent status: Secondary | ICD-10-CM

## 2022-02-14 DIAGNOSIS — I714 Abdominal aortic aneurysm, without rupture, unspecified: Secondary | ICD-10-CM | POA: Diagnosis present

## 2022-02-14 LAB — COMPREHENSIVE METABOLIC PANEL
ALT: 28 U/L (ref 0–44)
AST: 88 U/L — ABNORMAL HIGH (ref 15–41)
Albumin: 4 g/dL (ref 3.5–5.0)
Alkaline Phosphatase: 77 U/L (ref 38–126)
Anion gap: 16 — ABNORMAL HIGH (ref 5–15)
BUN: 12 mg/dL (ref 8–23)
CO2: 24 mmol/L (ref 22–32)
Calcium: 9.6 mg/dL (ref 8.9–10.3)
Chloride: 96 mmol/L — ABNORMAL LOW (ref 98–111)
Creatinine, Ser: 1.39 mg/dL — ABNORMAL HIGH (ref 0.44–1.00)
GFR, Estimated: 40 mL/min — ABNORMAL LOW (ref >60)
Glucose, Bld: 130 mg/dL — ABNORMAL HIGH (ref 70–99)
Potassium: 2.6 mmol/L — CL (ref 3.5–5.1)
Sodium: 136 mmol/L (ref 135–145)
Total Bilirubin: 0.9 mg/dL (ref 0.3–1.2)
Total Protein: 7.2 g/dL (ref 6.5–8.1)

## 2022-02-14 LAB — LACTIC ACID, PLASMA
Lactic Acid, Venous: 2.7 mmol/L (ref 0.5–1.9)
Lactic Acid, Venous: 1.2 mmol/L (ref 0.5–1.9)

## 2022-02-14 LAB — URINALYSIS, ROUTINE W REFLEX MICROSCOPIC
Bilirubin Urine: NEGATIVE
Glucose, UA: NEGATIVE mg/dL
Hgb urine dipstick: NEGATIVE
Ketones, ur: NEGATIVE mg/dL
Nitrite: NEGATIVE
Protein, ur: NEGATIVE mg/dL
Specific Gravity, Urine: 1.01 (ref 1.005–1.030)
pH: 7.5 (ref 5.0–8.0)

## 2022-02-14 LAB — CBC
HCT: 43.6 % (ref 36.0–46.0)
Hemoglobin: 15 g/dL (ref 12.0–15.0)
MCH: 28.6 pg (ref 26.0–34.0)
MCHC: 34.4 g/dL (ref 30.0–36.0)
MCV: 83.2 fL (ref 80.0–100.0)
Platelets: 185 10*3/uL (ref 150–400)
RBC: 5.24 MIL/uL — ABNORMAL HIGH (ref 3.87–5.11)
RDW: 13.2 % (ref 11.5–15.5)
WBC: 8.2 10*3/uL (ref 4.0–10.5)
nRBC: 0 % (ref 0.0–0.2)

## 2022-02-14 LAB — RESP PANEL BY RT-PCR (FLU A&B, COVID) ARPGX2
Influenza A by PCR: NEGATIVE
Influenza B by PCR: NEGATIVE
SARS Coronavirus 2 by RT PCR: NEGATIVE

## 2022-02-14 LAB — OCCULT BLOOD X 1 CARD TO LAB, STOOL: Fecal Occult Bld: NEGATIVE

## 2022-02-14 LAB — LIPASE, BLOOD: Lipase: 37 U/L (ref 11–51)

## 2022-02-14 LAB — MAGNESIUM: Magnesium: 1.4 mg/dL — ABNORMAL LOW (ref 1.7–2.4)

## 2022-02-14 MED ORDER — ONDANSETRON HCL 4 MG PO TABS: 4.0000 mg | ORAL_TABLET | Freq: Four times a day (QID) | ORAL | Status: DC | PRN

## 2022-02-14 MED ORDER — FOSFOMYCIN TROMETHAMINE 3 G PO PACK
3.0000 g | PACK | Freq: Once | ORAL | Status: AC
  Administered 2022-02-14: 3 g via ORAL
  Filled 2022-02-14: qty 3

## 2022-02-14 MED ORDER — ACETAMINOPHEN 325 MG PO TABS
650.0000 mg | ORAL_TABLET | Freq: Four times a day (QID) | ORAL | Status: DC | PRN
  Administered 2022-02-16: 325 mg via ORAL
  Filled 2022-02-14: qty 2

## 2022-02-14 MED ORDER — IOHEXOL 350 MG/ML SOLN
100.0000 mL | Freq: Once | INTRAVENOUS | Status: AC | PRN
  Administered 2022-02-14: 80 mL via INTRAVENOUS

## 2022-02-14 MED ORDER — SODIUM CHLORIDE 0.9 % IV BOLUS
1000.0000 mL | Freq: Once | INTRAVENOUS | Status: AC
  Administered 2022-02-14: 1000 mL via INTRAVENOUS

## 2022-02-14 MED ORDER — FENTANYL CITRATE PF 50 MCG/ML IJ SOSY: 50.0000 ug | PREFILLED_SYRINGE | INTRAMUSCULAR | Status: DC | PRN

## 2022-02-14 MED ORDER — ACETAMINOPHEN 650 MG RE SUPP: 650.0000 mg | Freq: Four times a day (QID) | RECTAL | Status: DC | PRN

## 2022-02-14 MED ORDER — POTASSIUM CHLORIDE IN NACL 40-0.9 MEQ/L-% IV SOLN
INTRAVENOUS | Status: AC
  Filled 2022-02-14 (×3): qty 1000

## 2022-02-14 MED ORDER — HYDROCODONE-ACETAMINOPHEN 5-325 MG PO TABS: 1.0000 | ORAL_TABLET | Freq: Four times a day (QID) | ORAL | Status: DC | PRN

## 2022-02-14 MED ORDER — ONDANSETRON HCL 4 MG/2ML IJ SOLN: 4.0000 mg | Freq: Four times a day (QID) | INTRAMUSCULAR | Status: DC | PRN

## 2022-02-14 MED ORDER — FENTANYL CITRATE PF 50 MCG/ML IJ SOSY
50.0000 ug | PREFILLED_SYRINGE | Freq: Once | INTRAMUSCULAR | Status: AC
  Administered 2022-02-14: 50 ug via INTRAVENOUS
  Filled 2022-02-14: qty 1

## 2022-02-14 MED ORDER — MAGNESIUM SULFATE 2 GM/50ML IV SOLN
2.0000 g | Freq: Once | INTRAVENOUS | Status: AC
  Administered 2022-02-14: 2 g via INTRAVENOUS
  Filled 2022-02-14: qty 50

## 2022-02-14 MED ORDER — ONDANSETRON HCL 4 MG/2ML IJ SOLN
4.0000 mg | Freq: Once | INTRAMUSCULAR | Status: AC | PRN
  Administered 2022-02-14: 4 mg via INTRAVENOUS
  Filled 2022-02-14: qty 2

## 2022-02-14 MED ORDER — MAGNESIUM OXIDE -MG SUPPLEMENT 400 (240 MG) MG PO TABS
400.0000 mg | ORAL_TABLET | Freq: Once | ORAL | Status: AC
  Administered 2022-02-14: 400 mg via ORAL
  Filled 2022-02-14: qty 1

## 2022-02-14 MED ORDER — POTASSIUM CHLORIDE CRYS ER 20 MEQ PO TBCR
40.0000 meq | EXTENDED_RELEASE_TABLET | Freq: Once | ORAL | Status: AC
  Administered 2022-02-14: 40 meq via ORAL
  Filled 2022-02-14: qty 2

## 2022-02-14 MED ORDER — POTASSIUM CHLORIDE 10 MEQ/100ML IV SOLN
10.0000 meq | INTRAVENOUS | Status: AC
  Administered 2022-02-14 (×4): 10 meq via INTRAVENOUS
  Filled 2022-02-14 (×4): qty 100

## 2022-02-14 MED ORDER — LEVOTHYROXINE SODIUM 112 MCG PO TABS
112.0000 ug | ORAL_TABLET | Freq: Every day | ORAL | Status: DC
  Administered 2022-02-15 – 2022-02-17 (×3): 112 ug via ORAL
  Filled 2022-02-14 (×3): qty 1

## 2022-02-14 MED ORDER — PANTOPRAZOLE SODIUM 40 MG IV SOLR
40.0000 mg | INTRAVENOUS | Status: DC
  Administered 2022-02-14 – 2022-02-15 (×2): 40 mg via INTRAVENOUS
  Filled 2022-02-14 (×3): qty 10

## 2022-02-14 NOTE — ED Notes (Signed)
Patient transported to CT 

## 2022-02-14 NOTE — ED Provider Notes (Signed)
[facility] Provider Note   CSN: [id number] Arrival date & time: 02/14/22  2458     History  Chief Complaint  Patient presents with   Nausea   Emesis   Diarrhea    Cassandra Rios is a 71 y.o. female.  HPI    71 y.o.  female with history of HTN, HLD, hypothyroidism, multiple sclerosis, COPD, AAA, hiatal hernia status post repair, status post cholecystectomy who presents to the emergency department with nausea, vomiting, diarrhea for the past week.  She states that she was hospitalized for a GI bleed in the ICU in May.  She denies any hematemesis or hematochezia.  She does not think she had any melena.  She states that her C. difficile testing in the hospital in May was negative.  She denies any recent antibiotic use.  She endorses crampy diffuse abdominal pain, rated as 9 out of 10 in severity.  She has been unable to tolerate any oral intake.  She is having watery bowel movements, ongoing since this past Monday.  She is passing gas.  She does have a history of AAA and her most recent CT angiogram was in 2021.  She follows outpatient with vascular surgery for this with Dr. Donzetta Matters.  Her CTA showed an infrarenal abdominal aortic aneurysm measuring 4.2 cm in October 2021.    Home Medications Prior to Admission medications   Medication Sig Start Date End Date Taking? Authorizing Provider  atorvastatin (LIPITOR) 80 MG tablet Take 1 tablet (80 mg total) by mouth daily. 09/21/21  Yes Belva Crome, MD  FLUoxetine (PROZAC) 40 MG capsule Take 1 capsule (40 mg total) by mouth daily. 11/18/21  Yes Roma Schanz R, DO  gabapentin (NEURONTIN) 300 MG capsule Take 600 mg by mouth every evening.  09/19/18  Yes [provider]  hydrochlorothiazide (HYDRODIURIL) 25 MG tablet TAKE 1 TABLET (25 MG TOTAL) BY MOUTH DAILY. 01/12/22  Yes Ann Held, DO  HYDROcodone-acetaminophen (NORCO/VICODIN) 5-325 MG tablet Take 1 tablet by mouth every 6 (six) hours as  needed for moderate pain.   Yes [provider]  Interferon Beta-1b (BETASERON/EXTAVIA) 0.3 MG KIT injection Inject 0.25 mg into the skin every other day.   Yes [provider]  levothyroxine (SYNTHROID) 112 MCG tablet Take 1 tablet (112 mcg total) by mouth daily before breakfast. 01/23/22  Yes Carollee Herter, Yvonne R, DO  losartan (COZAAR) 100 MG tablet Take 1 tablet (100 mg total) by mouth daily. 12/20/21  Yes Ann Held, DO  meclizine (ANTIVERT) 12.5 MG tablet Take 1 tablet (12.5 mg total) by mouth 3 (three) times daily as needed for dizziness (vertigo). 01/11/22  Yes Belva Crome, MD  pantoprazole (PROTONIX) 40 MG tablet Take 1 tablet (40 mg total) by mouth 2 (two) times daily before a meal. 01/02/22  Yes Carollee Herter, Alferd Apa, DO  Polyethyl Glycol-Propyl Glycol (SYSTANE ULTRA OP) Place 1 drop into the right eye daily as needed (dry eye).    Yes [provider]  acetaminophen (TYLENOL) 500 MG tablet Take 1,000 mg by mouth every 6 (six) hours as needed for moderate pain or headache.    [provider]  clobetasol ointment (TEMOVATE) 0.05 % Apply topically 2 (two) times daily. 11/22/21   [provider]  mometasone (NASONEX) 50 MCG/ACT nasal spray Place 2 sprays into the nose daily as needed (allergies).    [provider]  nitroGLYCERIN (NITROSTAT) 0.4 MG SL tablet Place 1  tablet (0.4 mg total) under the tongue every 5 (five) minutes as needed for chest pain. Patient not taking: Reported on 11/29/2021 10/07/21   Belva Crome, MD  ondansetron [facility]) 4 MG tablet Take 1 tablet (4 mg total) by mouth every 8 (eight) hours as needed for nausea or vomiting. 01/12/22   Carollee Herter, Alferd Apa, DO  propranolol (INDERAL) 10 MG tablet Take 1 tablet (10 mg total) by mouth 3 (three) times daily. Patient not taking: Reported on 02/14/2022 01/30/22   Carollee Herter, Alferd Apa, DO  silver sulfADIAZINE (SILVADENE) 1 % cream Apply 1 application. topically daily as  needed (irritated areas under abdomen). 09/03/20   [provider]  traMADol (ULTRAM) 50 MG tablet Take 50 mg by mouth every 8 (eight) hours as needed.    [provider]  triamcinolone ointment (KENALOG) 0.1 % Apply 1 application. topically daily as needed (rash). 09/03/20   [provider]      Allergies    Codeine, Hydromorphone hcl, Ibuprofen, Morphine sulfate, and Metronidazole    Review of Systems   Review of Systems  Gastrointestinal:  Positive for abdominal pain, diarrhea, nausea and vomiting. Negative for blood in stool.  All other systems reviewed and are negative.   Physical Exam Updated Vital Signs BP (!) 171/93 (BP Location: Right Wrist)   Pulse 64   Temp 98 F (36.7 C) (Oral)   Resp 16   Ht 5' 6" (1.676 m)   Wt 95.7 kg   SpO2 100%   BMI 34.06 kg/m  Physical Exam Vitals and nursing note reviewed.  Constitutional:      General: She is not in acute distress.    Appearance: She is well-developed.  HENT:     Head: Normocephalic and atraumatic.  Eyes:     Conjunctiva/sclera: Conjunctivae normal.  Cardiovascular:     Rate and Rhythm: Normal rate and regular rhythm.  Pulmonary:     Effort: Pulmonary effort is normal. No respiratory distress.     Breath sounds: Normal breath sounds.  Abdominal:     Palpations: Abdomen is soft.     Tenderness: There is abdominal tenderness. There is no guarding or rebound.     Comments: Generalized diffuse abdominal tenderness to palpation, no rebound or guarding  Musculoskeletal:        General: No swelling.     Cervical back: Neck supple.     Comments: Right lower extremity palpable cordlike mass along the patient's calf anteriorly, surrounding mild hematoma appreciated  Skin:    General: Skin is warm and dry.     Capillary Refill: Capillary refill takes less than 2 seconds.  Neurological:     Mental Status: She is alert.  Psychiatric:        Mood and Affect: Mood normal.     ED Results /  Procedures / Treatments   Labs (all labs ordered are listed, but only abnormal results are displayed) Labs Reviewed  COMPREHENSIVE METABOLIC PANEL - Abnormal; Notable for the following components:      Result Value   Potassium 2.6 (*)    Chloride 96 (*)    Glucose, Bld 130 (*)    Creatinine, Ser 1.39 (*)    AST 88 (*)    GFR, Estimated 40 (*)    Anion gap 16 (*)    All other components within normal limits  CBC - Abnormal; Notable for the following components:   RBC 5.24 (*)    All other components within normal limits  URINALYSIS, ROUTINE W REFLEX MICROSCOPIC - Abnormal; Notable for the following components:   Color, Urine COLORLESS (*)    Leukocytes,Ua SMALL (*)    Bacteria, UA FEW (*)    All other components within normal limits  LACTIC ACID, PLASMA - Abnormal; Notable for the following components:   Lactic Acid, Venous 2.7 (*)    All other components within normal limits  MAGNESIUM - Abnormal; Notable for the following components:   Magnesium 1.4 (*)    All other components within normal limits  RESP PANEL BY RT-PCR (FLU A&B, COVID) ARPGX2  LIPASE, BLOOD  LACTIC ACID, PLASMA  OCCULT BLOOD X 1 CARD TO LAB, STOOL    EKG EKG Interpretation  Date/Time:  Tuesday February 14 2022 08:36:13 EDT Ventricular Rate:  62 PR Interval:  144 QRS Duration: 138 QT Interval:  477 QTC Calculation: 485 R Axis:   5 Text Interpretation: Sinus rhythm Atrial premature complex Right bundle branch block U-waves noted in leads I and II Confirmed by Lawsing, James ([id number]) on 02/14/2022 9:13:05 AM  Radiology CT Angio Abd/Pel W and/or Wo Contrast  Result Date: 02/14/2022 CLINICAL DATA:  Vomiting, diarrhea and nausea. Recent GI bleed. Known history of abdominal aortic aneurysm. EXAM: CT ANGIOGRAPHY ABDOMEN AND PELVIS WITH CONTRAST AND WITHOUT CONTRAST TECHNIQUE: Multidetector CT imaging of the abdomen and pelvis was performed using the standard protocol during bolus administration of intravenous  contrast. Multiplanar reconstructed images and MIPs were obtained and reviewed to evaluate the vascular anatomy. RADIATION DOSE REDUCTION: This exam was performed according to the departmental dose-optimization program which includes automated exposure control, adjustment of the mA and/or kV according to patient size and/or use of iterative reconstruction technique. CONTRAST:  80mL OMNIPAQUE IOHEXOL 350 MG/ML SOLN COMPARISON:  Prior CTA of the abdomen and pelvis on 03/17/2020 FINDINGS: VASCULAR Aorta: Infrarenal abdominal aortic aneurysm demonstrates increased in size since the prior CTA in 2021. Maximal transverse dimensions on axial image are now approximately 4.2 x 4.8 cm on image 119/4 compared to 4.2 x 3.9 cm previously. No evidence of aneurysm rupture or aortic dissection. Celiac: Stable and normal patency with minimal atherosclerosis. Normally patent branch vessels and branch vessel anatomy. SMA: Stable atherosclerosis at origin without significant stenosis. Normally patent trunk and visualized branch vessels. Renals: Stable patency and mild atherosclerosis of single left and paired right renal arteries. 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 Distal branch vessels are reconstituted by SMA collateral supply. 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 Stable focal dilatation of the mid left common iliac artery with segment measuring 17 mm in diameter at a level of penetrating ulcer disease/ulcerated plaque. No significant iliac obstructive disease bilaterally. Proximal Outflow: Common femoral arteries and femoral bifurcations demonstrate normal patency. Veins: Venous phase imaging was also performed demonstrating normally patent venous structures in the abdomen and pelvis with no anatomic variants. Review of the MIP images confirms the above findings. NON-VASCULAR Lower chest: No acute abnormality. Calcified coronary  artery plaque again noted. Hepatobiliary: No focal liver abnormality is seen. Status post cholecystectomy. No biliary dilatation. Pancreas: Unremarkable. No pancreatic ductal dilatation or surrounding inflammatory changes. Spleen: Normal in size without focal abnormality. Adrenals/Urinary Tract: Unremarkable adrenal glands. Stable simple benign cysts of both kidneys. No hydronephrosis, renal calculi or bladder abnormalities. Stomach/Bowel: Stable appearance status post laparoscopic gastric band bariatric surgery. Bowel shows no evidence of obstruction, ileus, inflammation or lesion. The appendix is normal. No free intraperitoneal   air. No evidence on arterial or venous phases of imaging to suggest active bleeding in the gastrointestinal tract. Lymphatic: No enlarged abdominal or pelvic lymph nodes. Reproductive: Status post hysterectomy. No adnexal masses. Stable appearance of small retained left ovary. Other: No abdominal wall hernia or abnormality. No abdominopelvic ascites. Musculoskeletal: No acute or significant osseous findings. IMPRESSION: 1. Interval increase in size of infrarenal abdominal aortic aneurysm from previous measurement of 4.2 x 3.9 cm in 2021 to 4.8 x 4.2 cm currently. If the patient has not been referred previously to vascular surgery, recommend non emergent referral for follow-up as maximal aneurysm size is approaching 5 cm. Recommend follow-up CT/MR every 6 months and vascular consultation. This recommendation follows ACR consensus guidelines: White Paper of the ACR Incidental Findings Committee II on Vascular Findings. J Am Coll Radiol 2013; 10:789-794. 2. No acute findings in the abdomen or pelvis. 3. Coronary atherosclerosis again noted. 4. Stable appearance of laparoscopic gastric band. Electronically Signed   By: Glenn  Yamagata M.D.   On: 02/14/2022 09:29    Procedures Procedures    Medications Ordered in ED Medications  potassium chloride 10 mEq in 100 mL IVPB (10 mEq  Intravenous New Bag/Given 02/14/22 1120)  fosfomycin (MONUROL) packet 3 g (has no administration in time range)  ondansetron (ZOFRAN) injection 4 mg (4 mg Intravenous Given 02/14/22 0817)  fentaNYL (SUBLIMAZE) injection 50 mcg (50 mcg Intravenous Given 02/14/22 0817)  sodium chloride 0.9 % bolus 1,000 mL (1,000 mLs Intravenous New Bag/Given 02/14/22 0817)  potassium chloride SA (KLOR-CON M) CR tablet 40 mEq (40 mEq Oral Given 02/14/22 0836)  iohexol (OMNIPAQUE) 350 MG/ML injection 100 mL (80 mLs Intravenous Contrast Given 02/14/22 0848)  magnesium sulfate IVPB 2 g 50 mL (0 g Intravenous Stopped 02/14/22 1120)  magnesium oxide (MAG-OX) tablet 400 mg (400 mg Oral Given 02/14/22 0943)    ED Course/ Medical Decision Making/ A&P Clinical Course as of 02/14/22 1149  Tue Feb 14, 2022  0824 Potassium(!!): 2.6 [JL]  0824 Creatinine(!): 1.39 [JL]  0911 Fecal Occult Blood, POC: NEGATIVE [JL]  0911 Magnesium(!): 1.4 [JL]    Clinical Course User Index [JL] Lawsing, James, MD                           Medical Decision Making Amount and/or Complexity of Data Reviewed Labs: ordered. Decision-making details documented in ED Course. Radiology: ordered.  Risk OTC drugs. Prescription drug management. Decision regarding hospitalization.     71 y.o.  female with history of HTN, HLD, hypothyroidism, multiple sclerosis, COPD, AAA, hiatal hernia status post repair, status post cholecystectomy who presents to the emergency department with nausea, vomiting, diarrhea for the past week.  She states that she was hospitalized for a GI bleed in the ICU in May.  She denies any hematemesis or hematochezia.  She does not think she had any melena.  She states that her C. difficile testing in the hospital in May was negative.  She denies any recent antibiotic use.  She endorses crampy diffuse abdominal pain, rated as 9 out of 10 in severity.  She has been unable to tolerate any oral intake.  She is having watery bowel  movements, ongoing since this past Monday.  She is passing gas.  She does have a history of AAA and her most recent CT angiogram was in 2021.  She follows outpatient with vascular surgery for this with Dr. Cain.  Her CTA showed an infrarenal abdominal aortic aneurysm   measuring 4.2 cm in October 2021.    On arrival, the patient was vitally stable, afebrile, not tachycardic or tachypneic, BP 134/68, saturating 100% on room air.  Physical exam significant for an abdomen that had diffuse tenderness to palpation, no rebound or guarding.  Additionally on exam patient appears to have a superficial thrombus palpated anteriorly.  She has no right lower extremity swelling and no pain or cramping in her posterior leg.  Lower concern for DVT at this time.  Differential diagnosis of the patient's abdominal pain includes viral gastroenteritis (considered most likely), foodborne illness, considered mesenteric ischemia.  Lower concern for ruptured AAA, SBO.  Her evaluation significant for multiple electrolyte abnormalities to include a potassium of 2.6, hypomagnesemia to 1.4.  The patient also has an AKI with a serum creatinine of 1.39.  She was found to have a lactic acidosis 2.7 which resolved following IV fluid resuscitation to 1.2.  COVID-19 influenza PCR testing was negative, CBC without a leukocytosis or anemia, lipase normal, urinalysis without clear evidence of UTI with small leukocytes and few bacteria present.  Patient has no urinary frequency or dysuria at this time.  Given her abdominal cramping, will treat with a one-time dose of fosfomycin.   CTA abdomen pelvis revealed the following: IMPRESSION:  1. Interval increase in size of infrarenal abdominal aortic aneurysm  from previous measurement of 4.2 x 3.9 cm in 2021 to 4.8 x 4.2 cm  currently. If the patient has not been referred previously to  vascular surgery, recommend non emergent referral for follow-up as  maximal aneurysm size is approaching 5 cm.  Recommend follow-up CT/MR  every 6 months and vascular consultation. This recommendation  follows ACR consensus guidelines: White Paper of the ACR Incidental  Findings Committee II on Vascular Findings. J Am Coll Radiol 2013;  10:789-794.  2. No acute findings in the abdomen or pelvis.  3. Coronary atherosclerosis again noted.  4. Stable appearance of laparoscopic gastric band.    The patient was administered IV fentanyl, IV potassium and magnesium supplementation, IV Zofran and IV fluid bolus.  Given her AKI and multiple electrolyte abnormalities, hospitalist medicine was admitted for rehydration, continued electrolyte replenishment, observation.  Dr. Olevia Bowens of hospitalist medicine excepted the patient in admission.  Final Clinical Impression(s) / ED Diagnoses Final diagnoses:  Thrombophlebitis of superficial veins of right lower extremity  Hypokalemia  AKI (acute kidney injury) ([place])  Hypomagnesemia  Dehydration  Gastroenteritis    Rx / DC Orders ED Discharge Orders     None         Regan Lemming, MD 02/14/22 1149

## 2022-02-14 NOTE — Discharge Instructions (Addendum)
You have evidence on physical exam of a thrombophlebitis of your right lower extremity.  Recommend warm compresses for this.  Do not recommend NSAIDs given your history of GI bleeding and gastritis on EGD, do not recommend a blood thinner for this as it is a superficial clot and you have no signs or symptoms of a deep vein clot. If you develop crampy pain in your posterior leg/calf, unilateral leg swelling, this would be concerning for a clot and would warrant an ultrasound to further investigate.

## 2022-02-14 NOTE — H&P (Signed)
History and Physical    Patient: Cassandra Rios MRN:[id number] DOB: 12/15/1950 DOA: 02/14/2022 DOS: the patient was seen and examined on 02/14/2022 PCP: Lowne Chase, Yvonne R, DO  Patient coming from: Home  Chief Complaint:  Chief Complaint  Patient presents with   Nausea   Emesis   Diarrhea   HPI: Cassandra Rios is a 71 y.o. female with medical history significant of AAA, allergic rhinitis, chronic hoarseness, colon polyps, COPD, hyperlipidemia, hypertension, hypothyroidism, multiple sclerosis, class I obesity, history of UGI bleed in May due to NSAIDs induced gastric PUD who is coming to the emergency department due to nausea, vomiting and diarrhea associated with generalized abdominal pain for the past week.  No fever, night sweats, but positive chills.  No hematemesis.  No melena or hematochezia.  Her appetite is decreased.  She denied fever, chills, rhinorrhea, sore throat, wheezing or hemoptysis.  No chest pain, palpitations, diaphoresis, PND, orthopnea or recent pitting edema of the lower extremities.  Believe that no flank pain, dysuria, frequency or hematuria.  No polyuria, polydipsia, polyphagia or blurred vision.  She has not been taking her medications since last week to avoid GI upset or dehydration from the diuretic.  ED course: Initial vital signs were temperature 97.9 F, pulse 73, respirations 17, BP 134 to 68 mmHg O2 sat 100% on room air.  The patient received fentanyl 50 mcg p.o. x1 fosfomycin 3 g p.o., magnesium oxide 400 mg p.o., magnesium sulfate 2 g IVPB, ondansetron 4 mg IVP, KCl 40 mEq p.o. x1 and 1 L of normal saline bolus.  Lab work: Urinalysis shows small leukocyte esterase and a few bacteria.  CBC with a white count of 8.2, hemoglobin 15.0 g/dL and platelets 185.  Fecal occult blood was negative.  Coronavirus and influenza PCR negative.  Lipase normal.  CMP showed a potassium of 2.6 and chloride 96 mmol/L.  Glucose 130 and creatinine 1.39 mg/dL.  AST mildly  elevated at 88 units/L.  All other CMP measurements were normal.  Lactic acid 2.7 then 1.2 mmol/L.  Magnesium was 1.4 mg/dL.  Imaging: No acute findings in the abdomen.  There was increase size some AAA from 4.2 x 3.9 cm to 4.8 and by 4.2 cm.  Coronary atherosclerosis.   Review of Systems: As mentioned in the history of present illness. All other systems reviewed and are negative. Past Medical History:  Diagnosis Date   AAA (abdominal aortic aneurysm) (HCC)    Allergic rhinitis    Chronic hoarseness    Colon polyps    COPD (chronic obstructive pulmonary disease) (HCC)    Dr. Ramaswamy   Dyslipidemia    Gastric bleed 10/2021   stomach ulcers   Hypertension    Hypothyroidism    MS (multiple sclerosis) (HCC)    Obesity    Stomach ulcer from aspirin/ibuprofen-like drugs (NSAID's)    Past Surgical History:  Procedure Laterality Date   ABDOMINAL HYSTERECTOMY     BARIATRIC SURGERY  09/07/08   lap band   CARPAL TUNNEL RELEASE     CATARACT EXTRACTION Right 11/08/2017   CATARACT EXTRACTION Left 11/22/2017   CATARACT EXTRACTION, BILATERAL      Toric lenses for astigmatism   CHOLECYSTECTOMY     CHOLECYSTECTOMY     ESOPHAGOGASTRODUODENOSCOPY (EGD) WITH PROPOFOL N/A 10/17/2021   Procedure: ESOPHAGOGASTRODUODENOSCOPY (EGD) WITH PROPOFOL;  Surgeon: Outlaw, William, MD;  Location: [facility];  Service: Gastroenterology;  Laterality: N/A;   HIATAL HERNIA REPAIR  09/07/08   LEFT HEART CATH   History and Physical    Patient: Cassandra Rios EXB:[id number] DOB: 11/17/1950 DOA: 02/14/2022 DOS: the patient was seen and examined on 02/14/2022 PCP: Ann Held, DO  Patient coming from: Home  Chief Complaint:  Chief Complaint  Patient presents with   Nausea   Emesis   Diarrhea   HPI: Cassandra Rios is a 71 y.o. female with medical history significant of AAA, allergic rhinitis, chronic hoarseness, colon polyps, COPD, hyperlipidemia, hypertension, hypothyroidism, multiple sclerosis, class I obesity, history of UGI bleed in May due to NSAIDs induced gastric PUD who is coming to the emergency department due to nausea, vomiting and diarrhea associated with generalized abdominal pain for the past week.  No fever, night sweats, but positive chills.  No hematemesis.  No melena or hematochezia.  Her appetite is decreased.  She denied fever, chills, rhinorrhea, sore throat, wheezing or hemoptysis.  No chest pain, palpitations, diaphoresis, PND, orthopnea or recent pitting edema of the lower extremities.  Believe that no flank pain, dysuria, frequency or hematuria.  No polyuria, polydipsia, polyphagia or blurred vision.  She has not been taking her medications since last week to avoid GI upset or dehydration from the diuretic.  ED course: Initial vital signs were temperature 97.9 F, pulse 73, respirations 17, BP 134 to 68 mmHg O2 sat 100% on room air.  The patient received fentanyl 50 mcg p.o. x1 fosfomycin 3 g p.o., magnesium oxide 400 mg p.o., magnesium sulfate 2 g IVPB, ondansetron 4 mg IVP, KCl 40 mEq p.o. x1 and 1 L of normal saline bolus.  Lab work: Urinalysis shows small leukocyte esterase and a few bacteria.  CBC with a white count of 8.2, hemoglobin 15.0 g/dL and platelets 185.  Fecal occult blood was negative.  Coronavirus and influenza PCR negative.  Lipase normal.  CMP showed a potassium of 2.6 and chloride 96 mmol/L.  Glucose 130 and creatinine 1.39 mg/dL.  AST mildly  elevated at 88 units/L.  All other CMP measurements were normal.  Lactic acid 2.7 then 1.2 mmol/L.  Magnesium was 1.4 mg/dL.  Imaging: No acute findings in the abdomen.  There was increase size some AAA from 4.2 x 3.9 cm to 4.8 and by 4.2 cm.  Coronary atherosclerosis.   Review of Systems: As mentioned in the history of present illness. All other systems reviewed and are negative. Past Medical History:  Diagnosis Date   AAA (abdominal aortic aneurysm) (HCC)    Allergic rhinitis    Chronic hoarseness    Colon polyps    COPD (chronic obstructive pulmonary disease) ([place])    Dr. Chase Caller   Dyslipidemia    Gastric bleed 10/2021   stomach ulcers   Hypertension    Hypothyroidism    MS (multiple sclerosis) ([place])    Obesity    Stomach ulcer from aspirin/ibuprofen-like drugs (NSAID's)    Past Surgical History:  Procedure Laterality Date   ABDOMINAL HYSTERECTOMY     BARIATRIC SURGERY  09/07/08   lap band   CARPAL TUNNEL RELEASE     CATARACT EXTRACTION Right 11/08/2017   CATARACT EXTRACTION Left 11/22/2017   CATARACT EXTRACTION, BILATERAL      Toric lenses for astigmatism   CHOLECYSTECTOMY     CHOLECYSTECTOMY     ESOPHAGOGASTRODUODENOSCOPY (EGD) WITH PROPOFOL N/A 10/17/2021   Procedure: ESOPHAGOGASTRODUODENOSCOPY (EGD) WITH PROPOFOL;  Surgeon: Arta Silence, MD;  Location: [place];  Service: Gastroenterology;  Laterality: N/A;   HIATAL HERNIA REPAIR  09/07/08   LEFT HEART CATH  History and Physical    Patient: Cassandra Rios MRN:[id number] DOB: 12/15/1950 DOA: 02/14/2022 DOS: the patient was seen and examined on 02/14/2022 PCP: Lowne Chase, Yvonne R, DO  Patient coming from: Home  Chief Complaint:  Chief Complaint  Patient presents with   Nausea   Emesis   Diarrhea   HPI: Cassandra Rios is a 71 y.o. female with medical history significant of AAA, allergic rhinitis, chronic hoarseness, colon polyps, COPD, hyperlipidemia, hypertension, hypothyroidism, multiple sclerosis, class I obesity, history of UGI bleed in May due to NSAIDs induced gastric PUD who is coming to the emergency department due to nausea, vomiting and diarrhea associated with generalized abdominal pain for the past week.  No fever, night sweats, but positive chills.  No hematemesis.  No melena or hematochezia.  Her appetite is decreased.  She denied fever, chills, rhinorrhea, sore throat, wheezing or hemoptysis.  No chest pain, palpitations, diaphoresis, PND, orthopnea or recent pitting edema of the lower extremities.  Believe that no flank pain, dysuria, frequency or hematuria.  No polyuria, polydipsia, polyphagia or blurred vision.  She has not been taking her medications since last week to avoid GI upset or dehydration from the diuretic.  ED course: Initial vital signs were temperature 97.9 F, pulse 73, respirations 17, BP 134 to 68 mmHg O2 sat 100% on room air.  The patient received fentanyl 50 mcg p.o. x1 fosfomycin 3 g p.o., magnesium oxide 400 mg p.o., magnesium sulfate 2 g IVPB, ondansetron 4 mg IVP, KCl 40 mEq p.o. x1 and 1 L of normal saline bolus.  Lab work: Urinalysis shows small leukocyte esterase and a few bacteria.  CBC with a white count of 8.2, hemoglobin 15.0 g/dL and platelets 185.  Fecal occult blood was negative.  Coronavirus and influenza PCR negative.  Lipase normal.  CMP showed a potassium of 2.6 and chloride 96 mmol/L.  Glucose 130 and creatinine 1.39 mg/dL.  AST mildly  elevated at 88 units/L.  All other CMP measurements were normal.  Lactic acid 2.7 then 1.2 mmol/L.  Magnesium was 1.4 mg/dL.  Imaging: No acute findings in the abdomen.  There was increase size some AAA from 4.2 x 3.9 cm to 4.8 and by 4.2 cm.  Coronary atherosclerosis.   Review of Systems: As mentioned in the history of present illness. All other systems reviewed and are negative. Past Medical History:  Diagnosis Date   AAA (abdominal aortic aneurysm) (HCC)    Allergic rhinitis    Chronic hoarseness    Colon polyps    COPD (chronic obstructive pulmonary disease) (HCC)    Dr. Ramaswamy   Dyslipidemia    Gastric bleed 10/2021   stomach ulcers   Hypertension    Hypothyroidism    MS (multiple sclerosis) (HCC)    Obesity    Stomach ulcer from aspirin/ibuprofen-like drugs (NSAID's)    Past Surgical History:  Procedure Laterality Date   ABDOMINAL HYSTERECTOMY     BARIATRIC SURGERY  09/07/08   lap band   CARPAL TUNNEL RELEASE     CATARACT EXTRACTION Right 11/08/2017   CATARACT EXTRACTION Left 11/22/2017   CATARACT EXTRACTION, BILATERAL      Toric lenses for astigmatism   CHOLECYSTECTOMY     CHOLECYSTECTOMY     ESOPHAGOGASTRODUODENOSCOPY (EGD) WITH PROPOFOL N/A 10/17/2021   Procedure: ESOPHAGOGASTRODUODENOSCOPY (EGD) WITH PROPOFOL;  Surgeon: Outlaw, William, MD;  Location: [facility];  Service: Gastroenterology;  Laterality: N/A;   HIATAL HERNIA REPAIR  09/07/08   LEFT HEART CATH   History and Physical    Patient: Cassandra Rios EXB:[id number] DOB: 11/17/1950 DOA: 02/14/2022 DOS: the patient was seen and examined on 02/14/2022 PCP: Ann Held, DO  Patient coming from: Home  Chief Complaint:  Chief Complaint  Patient presents with   Nausea   Emesis   Diarrhea   HPI: Cassandra Rios is a 71 y.o. female with medical history significant of AAA, allergic rhinitis, chronic hoarseness, colon polyps, COPD, hyperlipidemia, hypertension, hypothyroidism, multiple sclerosis, class I obesity, history of UGI bleed in May due to NSAIDs induced gastric PUD who is coming to the emergency department due to nausea, vomiting and diarrhea associated with generalized abdominal pain for the past week.  No fever, night sweats, but positive chills.  No hematemesis.  No melena or hematochezia.  Her appetite is decreased.  She denied fever, chills, rhinorrhea, sore throat, wheezing or hemoptysis.  No chest pain, palpitations, diaphoresis, PND, orthopnea or recent pitting edema of the lower extremities.  Believe that no flank pain, dysuria, frequency or hematuria.  No polyuria, polydipsia, polyphagia or blurred vision.  She has not been taking her medications since last week to avoid GI upset or dehydration from the diuretic.  ED course: Initial vital signs were temperature 97.9 F, pulse 73, respirations 17, BP 134 to 68 mmHg O2 sat 100% on room air.  The patient received fentanyl 50 mcg p.o. x1 fosfomycin 3 g p.o., magnesium oxide 400 mg p.o., magnesium sulfate 2 g IVPB, ondansetron 4 mg IVP, KCl 40 mEq p.o. x1 and 1 L of normal saline bolus.  Lab work: Urinalysis shows small leukocyte esterase and a few bacteria.  CBC with a white count of 8.2, hemoglobin 15.0 g/dL and platelets 185.  Fecal occult blood was negative.  Coronavirus and influenza PCR negative.  Lipase normal.  CMP showed a potassium of 2.6 and chloride 96 mmol/L.  Glucose 130 and creatinine 1.39 mg/dL.  AST mildly  elevated at 88 units/L.  All other CMP measurements were normal.  Lactic acid 2.7 then 1.2 mmol/L.  Magnesium was 1.4 mg/dL.  Imaging: No acute findings in the abdomen.  There was increase size some AAA from 4.2 x 3.9 cm to 4.8 and by 4.2 cm.  Coronary atherosclerosis.   Review of Systems: As mentioned in the history of present illness. All other systems reviewed and are negative. Past Medical History:  Diagnosis Date   AAA (abdominal aortic aneurysm) (HCC)    Allergic rhinitis    Chronic hoarseness    Colon polyps    COPD (chronic obstructive pulmonary disease) ([place])    Dr. Chase Caller   Dyslipidemia    Gastric bleed 10/2021   stomach ulcers   Hypertension    Hypothyroidism    MS (multiple sclerosis) ([place])    Obesity    Stomach ulcer from aspirin/ibuprofen-like drugs (NSAID's)    Past Surgical History:  Procedure Laterality Date   ABDOMINAL HYSTERECTOMY     BARIATRIC SURGERY  09/07/08   lap band   CARPAL TUNNEL RELEASE     CATARACT EXTRACTION Right 11/08/2017   CATARACT EXTRACTION Left 11/22/2017   CATARACT EXTRACTION, BILATERAL      Toric lenses for astigmatism   CHOLECYSTECTOMY     CHOLECYSTECTOMY     ESOPHAGOGASTRODUODENOSCOPY (EGD) WITH PROPOFOL N/A 10/17/2021   Procedure: ESOPHAGOGASTRODUODENOSCOPY (EGD) WITH PROPOFOL;  Surgeon: Arta Silence, MD;  Location: [place];  Service: Gastroenterology;  Laterality: N/A;   HIATAL HERNIA REPAIR  09/07/08   LEFT HEART CATH

## 2022-02-14 NOTE — Progress Notes (Signed)
Plan of Care Note for accepted transfer   Patient: Cassandra Rios MRN: [id number]   DOA: 02/14/2022  Facility requesting transfer: [facility]. Requesting Provider: Tinnie Gens, MD. Reason for transfer: Electrolyte abnormalities and gastroenteritis. Facility course:  Per EDP: " HPI     71 y.o.  female with history of HTN, HLD, hypothyroidism, multiple sclerosis, COPD, AAA, hiatal hernia status post repair, status post cholecystectomy who presents to the emergency department with nausea, vomiting, diarrhea for the past week.  She states that she was hospitalized for a GI bleed in the ICU in May.  She denies any hematemesis or hematochezia.  She does not think she had any melena.  She states that her C. difficile testing in the hospital in May was negative.  She denies any recent antibiotic use.  She endorses crampy diffuse abdominal pain, rated as 9 out of 10 in severity.  She has been unable to tolerate any oral intake.  She is having watery bowel movements, ongoing since this past Monday.  She is passing gas.  She does have a history of AAA and her most recent CT angiogram was in 2021.  She follows outpatient with vascular surgery for this with Dr. Donzetta Matters.  Her CTA showed an infrarenal abdominal aortic aneurysm measuring 4.2 cm in October 2021."  Lab work:  Lactic acid, plasma [[id number]]   Collected: 02/14/22 1016   Updated: 02/14/22 1137   Specimen Type: Blood   Specimen Source: Vein    Lactic Acid, Venous 1.2 mmol/L  Urinalysis, Routine w reflex microscopic Urine, Clean Catch [[id number]] (Abnormal)   Collected: 02/14/22 0745   Updated: 02/14/22 1010   Specimen Source: Urine, Clean Catch    Color, Urine COLORLESS Abnormal    APPearance CLEAR   Specific Gravity, Urine 1.010   pH 7.5   Glucose, UA NEGATIVE mg/dL   Hgb urine dipstick NEGATIVE   Bilirubin Urine NEGATIVE   Ketones, ur NEGATIVE mg/dL   Protein, ur NEGATIVE mg/dL   Nitrite NEGATIVE   Leukocytes,Ua SMALL Abnormal    RBC /  HPF 0-5 RBC/hpf   WBC, UA 6-10 WBC/hpf   Bacteria, UA FEW Abnormal    Squamous Epithelial / LPF 0-5   Mucus PRESENT  Resp Panel by RT-PCR (Flu A&B, Covid) Anterior Nasal Swab [[id number]]   Collected: 02/14/22 0840   Updated: 02/14/22 0934   Specimen Source: Anterior Nasal Swab    SARS Coronavirus 2 by RT PCR NEGATIVE   Influenza A by PCR NEGATIVE   Influenza B by PCR NEGATIVE  Lactic acid, plasma [[id number]] (Abnormal)   Collected: 02/14/22 0813   Updated: 02/14/22 0923   Specimen Type: Blood   Specimen Source: Vein    Lactic Acid, Venous 2.7 High Panic   mmol/L  Magnesium [[id number]] (Abnormal)   Collected: 02/14/22 0745   Updated: 02/14/22 0851   Specimen Type: Blood   Specimen Source: Vein    Magnesium 1.4 Low  mg/dL  Occult blood card to lab, stool Provider will collect [[id number]]   Collected: 02/14/22 0813   Updated: 02/14/22 0847   Specimen Type: Stool   Specimen Source: Per Rectum    Fecal Occult Bld NEGATIVE  CBC [[id number]] (Abnormal)   Collected: 02/14/22 0746   Updated: 02/14/22 0824   Specimen Type: Blood   Specimen Source: Vein    WBC 8.2 K/uL   RBC 5.24 High  MIL/uL   Hemoglobin 15.0 g/dL   HCT 43.6 %   MCV 83.2 fL   MCH 28.6 pg  MCHC 34.4 g/dL   RDW 13.2 %   Platelets 185 K/uL   nRBC 0.0 %  Lipase, blood [[id number]]   Collected: 02/14/22 0746   Updated: 02/14/22 0823   Specimen Type: Blood   Specimen Source: Vein    Lipase 37 U/L  Comprehensive metabolic panel [[id number]] (Abnormal)   Collected: 02/14/22 0746   Updated: 02/14/22 0823   Specimen Type: Blood   Specimen Source: Vein    Sodium 136 mmol/L   Potassium 2.6 Low Panic   mmol/L   Chloride 96 Low  mmol/L   CO2 24 mmol/L   Glucose, Bld 130 High  mg/dL   BUN 12 mg/dL   Creatinine, Ser 1.39 High  mg/dL   Calcium 9.6 mg/dL   Total Protein 7.2 g/dL   Albumin 4.0 g/dL   AST 88 High  U/L   ALT 28 U/L   Alkaline Phosphatase 77 U/L   Total Bilirubin 0.9 mg/dL   GFR, Estimated 40 Low   mL/min   Anion gap 16 High     CTA abdomen and pelvis   IMPRESSION: 1. Interval increase in size of infrarenal abdominal aortic aneurysm from previous measurement of 4.2 x 3.9 cm in 2021 to 4.8 x 4.2 cm currently. If the patient has not been referred previously to vascular surgery, recommend non emergent referral for follow-up as maximal aneurysm size is approaching 5 cm. Recommend follow-up CT/MR every 6 months and vascular consultation. This recommendation follows ACR consensus guidelines: White Paper of the ACR Incidental Findings Committee II on Vascular Findings. J Am Coll Radiol 2013; 10:789-794. 2. No acute findings in the abdomen or pelvis. 3. Coronary atherosclerosis again noted. 4. Stable appearance of laparoscopic gastric band.   Plan of care: The patient is accepted for admission to Telemetry unit, at [facility]    Author: Reubin Milan, MD 02/14/2022  Check [email or web address] for on-call coverage.  Nursing staff, Please call [place] number on Amion as soon as patient's arrival, so appropriate admitting provider can evaluate the pt.

## 2022-02-14 NOTE — ED Notes (Signed)
CRITICAL VALUE STICKER  CRITICAL VALUE: K+ 2.6  RECEIVER (on-site recipient of call):Katelynn Heidler, RN  DATE & TIME NOTIFIED: [phone]  MESSENGER (representative from lab):Phyllis  MD NOTIFIED: [place]  TIME OF NOTIFICATION: 6803  RESPONSE:  See orders

## 2022-02-14 NOTE — ED Notes (Signed)
Pt complained of discomfort with IV.  IV infusing well, site is clean and intact, no signs of infiltration. Rate dose change of K to 90. Pt stated that she thought the IV was "OK"

## 2022-02-14 NOTE — ED Notes (Signed)
CRITICAL VALUE STICKER  CRITICAL VALUE: Lactic 2.7  RECEIVER (on-site recipient of call): Fara Chute, [place] NOTIFIED: [phone] 02/14/2022  MESSENGER (representative from lab): Silva Bandy  MD NOTIFIED: Armandina Gemma  TIME OF NOTIFICATION: 0920  RESPONSE:  See orders

## 2022-02-14 NOTE — ED Notes (Signed)
Handoff report given to [facility] 

## 2022-02-14 NOTE — ED Triage Notes (Signed)
Patient reports she has been vomiting and has had diarrhea and nausea for the past week. Reports she was hospitalized in ICU for a GI bleed in May. Reports she has not visualized any blood in emesis or stool but is concerned.

## 2022-02-15 DIAGNOSIS — M549 Dorsalgia, unspecified: Secondary | ICD-10-CM | POA: Diagnosis present

## 2022-02-15 DIAGNOSIS — E039 Hypothyroidism, unspecified: Secondary | ICD-10-CM | POA: Diagnosis present

## 2022-02-15 DIAGNOSIS — E785 Hyperlipidemia, unspecified: Secondary | ICD-10-CM | POA: Diagnosis present

## 2022-02-15 DIAGNOSIS — N179 Acute kidney failure, unspecified: Secondary | ICD-10-CM | POA: Diagnosis present

## 2022-02-15 DIAGNOSIS — I2510 Atherosclerotic heart disease of native coronary artery without angina pectoris: Secondary | ICD-10-CM | POA: Diagnosis present

## 2022-02-15 DIAGNOSIS — I5032 Chronic diastolic (congestive) heart failure: Secondary | ICD-10-CM

## 2022-02-15 DIAGNOSIS — Z6834 Body mass index (BMI) 34.0-34.9, adult: Secondary | ICD-10-CM | POA: Diagnosis not present

## 2022-02-15 DIAGNOSIS — R112 Nausea with vomiting, unspecified: Secondary | ICD-10-CM | POA: Diagnosis present

## 2022-02-15 DIAGNOSIS — Z20822 Contact with and (suspected) exposure to covid-19: Secondary | ICD-10-CM | POA: Diagnosis present

## 2022-02-15 DIAGNOSIS — A084 Viral intestinal infection, unspecified: Secondary | ICD-10-CM | POA: Diagnosis present

## 2022-02-15 DIAGNOSIS — Z79899 Other long term (current) drug therapy: Secondary | ICD-10-CM | POA: Diagnosis not present

## 2022-02-15 DIAGNOSIS — E669 Obesity, unspecified: Secondary | ICD-10-CM

## 2022-02-15 DIAGNOSIS — F32A Depression, unspecified: Secondary | ICD-10-CM | POA: Diagnosis present

## 2022-02-15 DIAGNOSIS — E860 Dehydration: Secondary | ICD-10-CM | POA: Diagnosis present

## 2022-02-15 DIAGNOSIS — K529 Noninfective gastroenteritis and colitis, unspecified: Secondary | ICD-10-CM

## 2022-02-15 DIAGNOSIS — G35 Multiple sclerosis: Secondary | ICD-10-CM | POA: Diagnosis present

## 2022-02-15 DIAGNOSIS — D696 Thrombocytopenia, unspecified: Secondary | ICD-10-CM | POA: Diagnosis present

## 2022-02-15 DIAGNOSIS — Z87891 Personal history of nicotine dependence: Secondary | ICD-10-CM | POA: Diagnosis not present

## 2022-02-15 DIAGNOSIS — I8001 Phlebitis and thrombophlebitis of superficial vessels of right lower extremity: Secondary | ICD-10-CM | POA: Diagnosis present

## 2022-02-15 DIAGNOSIS — E876 Hypokalemia: Secondary | ICD-10-CM | POA: Diagnosis present

## 2022-02-15 DIAGNOSIS — G8929 Other chronic pain: Secondary | ICD-10-CM | POA: Diagnosis present

## 2022-02-15 DIAGNOSIS — F419 Anxiety disorder, unspecified: Secondary | ICD-10-CM | POA: Diagnosis present

## 2022-02-15 DIAGNOSIS — I110 Hypertensive heart disease with heart failure: Secondary | ICD-10-CM | POA: Diagnosis present

## 2022-02-15 DIAGNOSIS — J449 Chronic obstructive pulmonary disease, unspecified: Secondary | ICD-10-CM | POA: Diagnosis present

## 2022-02-15 DIAGNOSIS — I7143 Infrarenal abdominal aortic aneurysm, without rupture: Secondary | ICD-10-CM | POA: Diagnosis present

## 2022-02-15 DIAGNOSIS — F418 Other specified anxiety disorders: Secondary | ICD-10-CM

## 2022-02-15 LAB — COMPREHENSIVE METABOLIC PANEL
ALT: 26 U/L (ref 0–44)
AST: 70 U/L — ABNORMAL HIGH (ref 15–41)
Albumin: 2.8 g/dL — ABNORMAL LOW (ref 3.5–5.0)
Alkaline Phosphatase: 60 U/L (ref 38–126)
Anion gap: 6 (ref 5–15)
BUN: 8 mg/dL (ref 8–23)
CO2: 20 mmol/L — ABNORMAL LOW (ref 22–32)
Calcium: 8.6 mg/dL — ABNORMAL LOW (ref 8.9–10.3)
Chloride: 112 mmol/L — ABNORMAL HIGH (ref 98–111)
Creatinine, Ser: 1.1 mg/dL — ABNORMAL HIGH (ref 0.44–1.00)
GFR, Estimated: 53 mL/min — ABNORMAL LOW (ref >60)
Glucose, Bld: 89 mg/dL (ref 70–99)
Potassium: 3.7 mmol/L (ref 3.5–5.1)
Sodium: 138 mmol/L (ref 135–145)
Total Bilirubin: 0.8 mg/dL (ref 0.3–1.2)
Total Protein: 6 g/dL — ABNORMAL LOW (ref 6.5–8.1)

## 2022-02-15 LAB — CBC
HCT: 37.7 % (ref 36.0–46.0)
Hemoglobin: 12.4 g/dL (ref 12.0–15.0)
MCH: 28.8 pg (ref 26.0–34.0)
MCHC: 32.9 g/dL (ref 30.0–36.0)
MCV: 87.7 fL (ref 80.0–100.0)
Platelets: 127 10*3/uL — ABNORMAL LOW (ref 150–400)
RBC: 4.3 MIL/uL (ref 3.87–5.11)
RDW: 13.2 % (ref 11.5–15.5)
WBC: 7.8 10*3/uL (ref 4.0–10.5)
nRBC: 0 % (ref 0.0–0.2)

## 2022-02-15 MED ORDER — FLUOXETINE HCL 20 MG PO CAPS
40.0000 mg | ORAL_CAPSULE | Freq: Every day | ORAL | Status: DC
  Administered 2022-02-15 – 2022-02-17 (×3): 40 mg via ORAL
  Filled 2022-02-15 (×3): qty 2

## 2022-02-15 MED ORDER — GABAPENTIN 300 MG PO CAPS
600.0000 mg | ORAL_CAPSULE | Freq: Every evening | ORAL | Status: DC
  Administered 2022-02-15 – 2022-02-16 (×2): 600 mg via ORAL
  Filled 2022-02-15 (×3): qty 2

## 2022-02-15 MED ORDER — HYDRALAZINE HCL 25 MG PO TABS
25.0000 mg | ORAL_TABLET | Freq: Four times a day (QID) | ORAL | Status: DC | PRN
  Administered 2022-02-15 – 2022-02-17 (×2): 25 mg via ORAL
  Filled 2022-02-15 (×2): qty 1

## 2022-02-15 MED ORDER — INTERFERON BETA-1B 0.3 MG ~~LOC~~ KIT
0.2500 mg | PACK | SUBCUTANEOUS | Status: DC
  Administered 2022-02-15: 0.25 mg via SUBCUTANEOUS
  Filled 2022-02-15: qty 0.3

## 2022-02-15 NOTE — Progress Notes (Signed)
Transition of Care [facility]) Screening Note   Patient Details  Name: Sergio Duchnowski Date of Birth: 1950/06/21   Transition of Care [facility]) CM/SW Contact:    Lavenia Atlas, RN Phone Number: 02/15/2022, 3:09 PM    Transition of Care Department [facility]) has reviewed patient and no TOC needs have been identified at this time. We will continue to monitor patient advancement through interdisciplinary progression rounds. If new patient transition needs arise, please place a TOC consult.

## 2022-02-15 NOTE — Progress Notes (Signed)
Mobility Specialist Cancellation/Refusal Note:    02/15/22 1010  Mobility  Activity Refused mobility     Reason for Cancellation/Refusal: Pt declined mobility at this time. NT just assisted w/ sitting in recliner. Will ambulate in the afternoon. Will check back as schedule permits.      [facility]

## 2022-02-15 NOTE — Progress Notes (Signed)
PROGRESS NOTE    Cassandra Rios  ZOX:[id number] DOB: July 17, 1950 DOA: 02/14/2022 PCP: [name], DO   Brief Narrative: Cassandra Rios is a 71 y.o. 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 Patient presented secondary to nausea, vomiting and diarrhea with concern for acute gastroenteritis. IV fluids initiated. Stool studies ordered and are pending.    Assessment and Plan:  Hypokalemia Severe with potassium of 2.6 on admission. Secondary to diarrhea/emesis. Potasium supplementation given. Hypokalemia resolved.  Acute gastroenteritis Uncertain etiology. Possibly viral. Supportive care initiated with IV fluids. -Continue IV fluids -Follow-up GI pathogen panel and C. Difficile testing  Depression Anxiety -Continue home Prozac  CAD Aspirin and Lipitor held on admission.  COPD Stable.  Chronic back pain -Continue Norco  Chronic diastolic heart failure Volume depleted on admission. Lasix and losartan held on admission.  Obesity Body mass index is 34.06 kg/m.   DVT prophylaxis: SCDs Code Status:   Code Status: Full Code Family Communication: None at bedside Disposition Plan: Discharge home when emesis improved and able to tolerate oral diet   Consultants:  None  Procedures:  None  Antimicrobials: None    Subjective: Patient reports a history of coffee ground spit up. Last episode yesterday. Patient reports using Mobic infrequently. No abdominal pain. No melena or hematochezia. No history of upper endoscopy.  Objective: BP (!) 153/69 (BP Location: Right Arm)   Pulse 68   Temp 98.1 F (36.7 C) (Oral)   Resp 20   Ht 5\' 6"  (1.676 m)   Wt 95.7 kg   SpO2 96%   BMI 34.06 kg/m   Examination:  General exam: Appears calm and comfortable Respiratory system: Clear to auscultation. Respiratory  effort normal. Cardiovascular system: S1 & S2 heard, RRR. No murmurs, rubs, gallops or clicks. Gastrointestinal system: Abdomen is nondistended, soft and nontender. Normal bowel sounds heard. Central nervous system: Alert and oriented. No focal neurological deficits. Musculoskeletal: No edema. No calf tenderness Skin: No cyanosis. No rashes Psychiatry: Judgement and insight appear normal. Mood & affect appropriate.    Data Reviewed: I have personally reviewed following labs and imaging studies  CBC Lab Results  Component Value Date   WBC 7.8 02/15/2022   RBC 4.30 02/15/2022   HGB 12.4 02/15/2022   HCT 37.7 02/15/2022   MCV 87.7 02/15/2022   MCH 28.8 02/15/2022   PLT 127 (L) 02/15/2022   MCHC 32.9 02/15/2022   RDW 13.2 02/15/2022   LYMPHSABS 1.4 01/12/2022   MONOABS 0.7 01/12/2022   EOSABS 0.1 01/12/2022   BASOSABS 0.0 01/12/2022     Last metabolic panel Lab Results  Component Value Date   NA 138 02/15/2022   K 3.7 02/15/2022   CL 112 (H) 02/15/2022   CO2 20 (L) 02/15/2022   BUN 8 02/15/2022   CREATININE 1.10 (H) 02/15/2022   GLUCOSE 89 02/15/2022   GFRNONAA 53 (L) 02/15/2022   GFRAA 67 07/03/2019   CALCIUM 8.6 (L) 02/15/2022   PHOS 4.2 12/30/2017   PROT 6.0 (L) 02/15/2022   ALBUMIN 2.8 (L) 02/15/2022   LABGLOB 2.7 09/05/2016   AGRATIO 1.5 09/05/2016   BILITOT 0.8 02/15/2022   ALKPHOS 60 02/15/2022   AST 70 (H) 02/15/2022   ALT 26 02/15/2022   ANIONGAP 6 02/15/2022    GFR: Estimated Creatinine Clearance: 53.9 mL/min (A) (by C-G formula based on SCr of 1.1 mg/dL (H)).  Recent  Results (from the past 240 hour(s))  Resp Panel by RT-PCR (Flu A&B, Covid) Anterior Nasal Swab     Status: None   Collection Time: 02/14/22  8:40 AM   Specimen: Anterior Nasal Swab  Result Value Ref Range Status   SARS Coronavirus 2 by RT PCR NEGATIVE NEGATIVE Final    Comment: (NOTE) SARS-CoV-2 target nucleic acids are NOT DETECTED.  The SARS-CoV-2 RNA is generally detectable in  upper respiratory specimens during the acute phase of infection. The lowest concentration of SARS-CoV-2 viral copies this assay can detect is 138 copies/mL. A negative result does not preclude SARS-Cov-2 infection and should not be used as the sole basis for treatment or other patient management decisions. A negative result may occur with  improper specimen collection/handling, submission of specimen other than nasopharyngeal swab, presence of viral mutation(s) within the areas targeted by this assay, and inadequate number of viral copies(<138 copies/mL). A negative result must be combined with clinical observations, patient history, and epidemiological information. The expected result is Negative.  Fact Sheet for Patients:  BloggerCourse.com  Fact Sheet for Healthcare Providers:  SeriousBroker.it  This test is no t yet approved or cleared by the [place] FDA and  has been authorized for detection and/or diagnosis of SARS-CoV-2 by FDA under an Emergency Use Authorization (EUA). This EUA will remain  in effect (meaning this test can be used) for the duration of the COVID-19 declaration under Section 564(b)(1) of the Act, 21 U.S.C.section 360bbb-3(b)(1), unless the authorization is terminated  or revoked sooner.       Influenza A by PCR NEGATIVE NEGATIVE Final   Influenza B by PCR NEGATIVE NEGATIVE Final    Comment: (NOTE) The Xpert Xpress SARS-CoV-2/FLU/RSV plus assay is intended as an aid in the diagnosis of influenza from Nasopharyngeal swab specimens and should not be used as a sole basis for treatment. Nasal washings and aspirates are unacceptable for Xpert Xpress SARS-CoV-2/FLU/RSV testing.  Fact Sheet for Patients: BloggerCourse.com  Fact Sheet for Healthcare Providers: SeriousBroker.it  This test is not yet approved or cleared by the [place] FDA and has been  authorized for detection and/or diagnosis of SARS-CoV-2 by FDA under an Emergency Use Authorization (EUA). This EUA will remain in effect (meaning this test can be used) for the duration of the COVID-19 declaration under Section 564(b)(1) of the Act, 21 U.S.C. section 360bbb-3(b)(1), unless the authorization is terminated or revoked.  Performed at [facility], [address] [place], [place] [address]       Radiology Studies: CT Angio Abd/Pel W and/or Wo Contrast  Result Date: 02/14/2022 CLINICAL DATA:  Vomiting, diarrhea and nausea. Recent GI bleed. Known history of abdominal aortic aneurysm. EXAM: CT ANGIOGRAPHY ABDOMEN AND PELVIS WITH CONTRAST AND WITHOUT CONTRAST TECHNIQUE: Multidetector CT imaging of the abdomen and pelvis was performed using the standard protocol during bolus administration of intravenous contrast. Multiplanar reconstructed images and MIPs were obtained and reviewed to evaluate the vascular anatomy. RADIATION DOSE REDUCTION: This exam was performed according to the departmental dose-optimization program which includes automated exposure control, adjustment of the mA and/or kV according to patient size and/or use of iterative reconstruction technique. CONTRAST:  80mL OMNIPAQUE IOHEXOL 350 MG/ML SOLN COMPARISON:  Prior CTA of the abdomen and pelvis on 03/17/2020 FINDINGS: VASCULAR Aorta: Infrarenal abdominal aortic aneurysm demonstrates increased in size since the prior CTA in 2021. Maximal transverse dimensions on axial image are now approximately 4.2 x 4.8 cm on image 119/4 compared to 4.2 x 3.9 cm  previously. No evidence of aneurysm rupture or aortic dissection. Celiac: Stable and normal patency with minimal atherosclerosis. Normally patent branch vessels and branch vessel anatomy. SMA: Stable atherosclerosis at origin without significant stenosis. Normally patent trunk and visualized branch vessels. Renals: Stable patency and mild atherosclerosis of  single left and paired right renal arteries. 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 Distal branch vessels are reconstituted by SMA collateral supply. 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 Stable focal dilatation of the mid left common iliac artery with segment measuring 17 mm in diameter at a level of penetrating ulcer disease/ulcerated plaque. No significant iliac obstructive disease bilaterally. Proximal Outflow: Common femoral arteries and femoral bifurcations demonstrate normal patency. Veins: Venous phase imaging was also performed demonstrating normally patent venous structures in the abdomen and pelvis with no anatomic variants. Review of the MIP images confirms the above findings. NON-VASCULAR Lower chest: No acute abnormality. Calcified coronary artery plaque again noted. Hepatobiliary: No focal liver abnormality is seen. Status post cholecystectomy. No biliary dilatation. Pancreas: Unremarkable. No pancreatic ductal dilatation or surrounding inflammatory changes. Spleen: Normal in size without focal abnormality. Adrenals/Urinary Tract: Unremarkable adrenal glands. Stable simple benign cysts of both kidneys. No hydronephrosis, renal calculi or bladder abnormalities. Stomach/Bowel: Stable appearance status post laparoscopic gastric band bariatric surgery. Bowel shows no evidence of obstruction, ileus, inflammation or lesion. The appendix is normal. No free intraperitoneal air. No evidence on arterial or venous phases of imaging to suggest active bleeding in the gastrointestinal tract. Lymphatic: No enlarged abdominal or pelvic lymph nodes. Reproductive: Status post hysterectomy. No adnexal masses. Stable appearance of small retained left ovary. Other: No abdominal wall hernia or abnormality. No abdominopelvic ascites. Musculoskeletal: No acute or significant osseous findings. IMPRESSION: 1. Interval increase  in size of infrarenal abdominal aortic aneurysm from previous measurement of 4.2 x 3.9 cm in 2021 to 4.8 x 4.2 cm currently. If the patient has not been referred previously to vascular surgery, recommend non emergent referral for follow-up as maximal aneurysm size is approaching 5 cm. Recommend follow-up CT/MR every 6 months and vascular consultation. This recommendation follows ACR consensus guidelines: White Paper of the ACR Incidental Findings Committee II on Vascular Findings. J Am Coll Radiol 2013; 10:789-794. 2. No acute findings in the abdomen or pelvis. 3. Coronary atherosclerosis again noted. 4. Stable appearance of laparoscopic gastric band. Electronically Signed   By: Irish Lack M.D.   On: 02/14/2022 09:29      LOS: 0 days    Jacquelin Hawking, MD [facility] 02/15/2022, 8:50 AM   If 7PM-7AM, please contact night-coverage [email or web address]

## 2022-02-15 NOTE — Progress Notes (Signed)
   02/15/22 1720  Vitals  Temp 98 F (36.7 C)  Temp Source Oral  BP (!) 182/72  MAP (mmHg) 106  BP Location Right Arm  BP Method Automatic  Patient Position (if appropriate) Lying  Pulse Rate (!) 59  Pulse Rate Source Monitor  MEWS COLOR  MEWS Score Color Green  Oxygen Therapy  SpO2 100 %  O2 Device Room Air  MEWS Score  MEWS Temp 0  MEWS Systolic 0  MEWS Pulse 0  MEWS RR 0  MEWS LOC 0  MEWS Score 0   Will notify Dr. Lonny Prude, & question whether hydralazine will decrease HR

## 2022-02-15 NOTE — Progress Notes (Signed)
Notified on call provider about patient's elevated blood pressures. No new orders at this time. Will continue to monitor.

## 2022-02-15 NOTE — Progress Notes (Signed)
Follow up post hydralazine 25 mg po   02/15/22 1939  Vitals  BP (!) 144/85

## 2022-02-15 NOTE — Plan of Care (Signed)

## 2022-02-15 NOTE — Plan of Care (Signed)
  Problem: Education: Goal: Knowledge of General Education information will improve Description Including pain rating scale, medication(s)/side effects and non-pharmacologic comfort measures Outcome: Progressing   Problem: Health Behavior/Discharge Planning: Goal: Ability to manage health-related needs will improve Outcome: Progressing   

## 2022-02-15 NOTE — Progress Notes (Signed)
Mobility Specialist Cancellation/Refusal Note:    02/15/22 1343  Mobility  Activity Refused mobility     Reason for Cancellation/Refusal: Pt declined mobility at this time. Pt mentioned this being the first day out the bed & wants to try ambulating tomorrow. Will check back as schedule permits.       [facility]

## 2022-02-15 NOTE — Hospital Course (Addendum)
Cassandra Rios is a 71 y.o. 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 Patient presented secondary to nausea, vomiting and diarrhea with concern for acute gastroenteritis. IV fluids initiated. Stool studies ordered and are pending but patient is improved.

## 2022-02-16 DIAGNOSIS — E669 Obesity, unspecified: Secondary | ICD-10-CM | POA: Diagnosis not present

## 2022-02-16 DIAGNOSIS — I5032 Chronic diastolic (congestive) heart failure: Secondary | ICD-10-CM | POA: Diagnosis not present

## 2022-02-16 DIAGNOSIS — K529 Noninfective gastroenteritis and colitis, unspecified: Secondary | ICD-10-CM | POA: Diagnosis not present

## 2022-02-16 DIAGNOSIS — E876 Hypokalemia: Secondary | ICD-10-CM | POA: Diagnosis not present

## 2022-02-16 LAB — PLATELET COUNT: Platelets: 137 10*3/uL — ABNORMAL LOW (ref 150–400)

## 2022-02-16 MED ORDER — PANTOPRAZOLE SODIUM 40 MG PO TBEC
40.0000 mg | DELAYED_RELEASE_TABLET | Freq: Two times a day (BID) | ORAL | Status: DC
  Administered 2022-02-16 – 2022-02-17 (×3): 40 mg via ORAL
  Filled 2022-02-16 (×3): qty 1

## 2022-02-16 NOTE — Progress Notes (Signed)
Mobility Specialist - Progress Note   02/16/22 1455  Mobility  HOB Elevated/Bed Position Self regulated  Activity Ambulated with assistance in hallway  Range of Motion/Exercises Active  Level of Assistance Standby assist, set-up cues, supervision of patient - no hands on  Assistive Device Front wheel walker  Distance Ambulated (ft) 175 ft  Activity Response Tolerated well  Transport method Ambulatory  $Mobility charge 1 Mobility   Pt received in recliner and agreeable to mobility. C/o arm pain from the walker during ambulation. Pt to bed after session with all needs met.    [facility]

## 2022-02-16 NOTE — Progress Notes (Signed)
Patient had two stools on this shift from 7p-0430am so far. Unable to collect specimen due to urine contaminating the stool. Will cont to attempt collection.

## 2022-02-16 NOTE — Progress Notes (Signed)
PROGRESS NOTE    Cassandra Rios  NUU:[id number] DOB: 1950/08/22 DOA: 02/14/2022 PCP: [name], DO   Brief Narrative: Cassandra Rios is a 71 y.o. 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 Patient presented secondary to nausea, vomiting and diarrhea with concern for acute gastroenteritis. IV fluids initiated. Stool studies ordered and are pending.    Assessment and Plan:  Hypokalemia Severe with potassium of 2.6 on admission. Secondary to diarrhea/emesis. Potasium supplementation given. Hypokalemia resolved.  Acute gastroenteritis Uncertain etiology. Possibly viral. Supportive care initiated with IV fluids. -Continue IV fluids -Follow-up GI pathogen panel and C. Difficile testing -Advance to soft diet -Discontinue IV fluids  Depression Anxiety -Continue home Prozac  CAD Aspirin and Lipitor held on admission.  COPD Stable.  Chronic back pain -Continue Norco  Chronic diastolic heart failure Volume depleted on admission. Lasix and losartan held on admission.  Thrombocytopenia Mild with platelets down to 127. Likely related to acute illness. -CBC today  Obesity Body mass index is 34.06 kg/m.   DVT prophylaxis: SCDs Code Status:   Code Status: Full Code Family Communication: None at bedside Disposition Plan: Discharge home when emesis improved and able to tolerate oral diet   Consultants:  None  Procedures:  None  Antimicrobials: None    Subjective: Patient with more formed stool overnight. No abdominal pain. No emesis.  Objective: BP (!) 167/67 (BP Location: Left Arm)   Pulse 60   Temp 98.3 F (36.8 C) (Oral)   Resp 20   Ht 5\' 6"  (1.676 m)   Wt 95.7 kg   SpO2 97%   BMI 34.06 kg/m   Examination:  General exam: Appears calm and comfortable Respiratory system: Clear to  auscultation. Respiratory effort normal. Cardiovascular system: S1 & S2 heard, Normal rate with regular rhythm. Gastrointestinal system: Abdomen is nondistended, soft and nontender. Normal bowel sounds heard. Central nervous system: Alert and oriented. No focal neurological deficits. Musculoskeletal: No edema. No calf tenderness Skin: No cyanosis. No rashes Psychiatry: Judgement and insight appear normal. Mood & affect appropriate.    Data Reviewed: I have personally reviewed following labs and imaging studies  CBC Lab Results  Component Value Date   WBC 7.8 02/15/2022   RBC 4.30 02/15/2022   HGB 12.4 02/15/2022   HCT 37.7 02/15/2022   MCV 87.7 02/15/2022   MCH 28.8 02/15/2022   PLT 127 (L) 02/15/2022   MCHC 32.9 02/15/2022   RDW 13.2 02/15/2022   LYMPHSABS 1.4 01/12/2022   MONOABS 0.7 01/12/2022   EOSABS 0.1 01/12/2022   BASOSABS 0.0 01/12/2022     Last metabolic panel Lab Results  Component Value Date   NA 138 02/15/2022   K 3.7 02/15/2022   CL 112 (H) 02/15/2022   CO2 20 (L) 02/15/2022   BUN 8 02/15/2022   CREATININE 1.10 (H) 02/15/2022   GLUCOSE 89 02/15/2022   GFRNONAA 53 (L) 02/15/2022   GFRAA 67 07/03/2019   CALCIUM 8.6 (L) 02/15/2022   PHOS 4.2 12/30/2017   PROT 6.0 (L) 02/15/2022   ALBUMIN 2.8 (L) 02/15/2022   LABGLOB 2.7 09/05/2016   AGRATIO 1.5 09/05/2016   BILITOT 0.8 02/15/2022   ALKPHOS 60 02/15/2022   AST 70 (H) 02/15/2022   ALT 26 02/15/2022   ANIONGAP 6 02/15/2022    GFR: Estimated Creatinine Clearance: 53.9 mL/min (A) (by C-G formula based on SCr of 1.1 mg/dL (H)).  Recent Results (from the past 240 hour(s))  Resp Panel by RT-PCR (Flu A&B, Covid) Anterior Nasal Swab     Status: None   Collection Time: 02/14/22  8:40 AM   Specimen: Anterior Nasal Swab  Result Value Ref Range Status   SARS Coronavirus 2 by RT PCR NEGATIVE NEGATIVE Final    Comment: (NOTE) SARS-CoV-2 target nucleic acids are NOT DETECTED.  The SARS-CoV-2 RNA is  generally detectable in upper respiratory specimens during the acute phase of infection. The lowest concentration of SARS-CoV-2 viral copies this assay can detect is 138 copies/mL. A negative result does not preclude SARS-Cov-2 infection and should not be used as the sole basis for treatment or other patient management decisions. A negative result may occur with  improper specimen collection/handling, submission of specimen other than nasopharyngeal swab, presence of viral mutation(s) within the areas targeted by this assay, and inadequate number of viral copies(<138 copies/mL). A negative result must be combined with clinical observations, patient history, and epidemiological information. The expected result is Negative.  Fact Sheet for Patients:  BloggerCourse.com  Fact Sheet for Healthcare Providers:  SeriousBroker.it  This test is no t yet approved or cleared by the [place] FDA and  has been authorized for detection and/or diagnosis of SARS-CoV-2 by FDA under an Emergency Use Authorization (EUA). This EUA will remain  in effect (meaning this test can be used) for the duration of the COVID-19 declaration under Section 564(b)(1) of the Act, 21 U.S.C.section 360bbb-3(b)(1), unless the authorization is terminated  or revoked sooner.       Influenza A by PCR NEGATIVE NEGATIVE Final   Influenza B by PCR NEGATIVE NEGATIVE Final    Comment: (NOTE) The Xpert Xpress SARS-CoV-2/FLU/RSV plus assay is intended as an aid in the diagnosis of influenza from Nasopharyngeal swab specimens and should not be used as a sole basis for treatment. Nasal washings and aspirates are unacceptable for Xpert Xpress SARS-CoV-2/FLU/RSV testing.  Fact Sheet for Patients: BloggerCourse.com  Fact Sheet for Healthcare Providers: SeriousBroker.it  This test is not yet approved or cleared by the [place] FDA and has been authorized for detection and/or diagnosis of SARS-CoV-2 by FDA under an Emergency Use Authorization (EUA). This EUA will remain in effect (meaning this test can be used) for the duration of the COVID-19 declaration under Section 564(b)(1) of the Act, 21 U.S.C. section 360bbb-3(b)(1), unless the authorization is terminated or revoked.  Performed at [facility], [address], [place], [place] [address]       Radiology Studies: CT Angio Abd/Pel W and/or Wo Contrast  Result Date: 02/14/2022 CLINICAL DATA:  Vomiting, diarrhea and nausea. Recent GI bleed. Known history of abdominal aortic aneurysm. EXAM: CT ANGIOGRAPHY ABDOMEN AND PELVIS WITH CONTRAST AND WITHOUT CONTRAST TECHNIQUE: Multidetector CT imaging of the abdomen and pelvis was performed using the standard protocol during bolus administration of intravenous contrast. Multiplanar reconstructed images and MIPs were obtained and reviewed to evaluate the vascular anatomy. RADIATION DOSE REDUCTION: This exam was performed according to the departmental dose-optimization program which includes automated exposure control, adjustment of the mA and/or kV according to patient size and/or use of iterative reconstruction technique. CONTRAST:  80mL OMNIPAQUE IOHEXOL 350 MG/ML SOLN COMPARISON:  Prior CTA of the abdomen and pelvis on 03/17/2020 FINDINGS: VASCULAR Aorta: Infrarenal abdominal aortic aneurysm demonstrates increased in size since the prior CTA in 2021. Maximal transverse dimensions on axial image are now approximately 4.2 x 4.8 cm on image 119/4 compared to 4.2 x 3.9  cm previously. No evidence of aneurysm rupture or aortic dissection. Celiac: Stable and normal patency with minimal atherosclerosis. Normally patent branch vessels and branch vessel anatomy. SMA: Stable atherosclerosis at origin without significant stenosis. Normally patent trunk and visualized branch vessels. Renals: Stable patency and  mild atherosclerosis of single left and paired right renal arteries. 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 Distal branch vessels are reconstituted by SMA collateral supply. 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 Stable focal dilatation of the mid left common iliac artery with segment measuring 17 mm in diameter at a level of penetrating ulcer disease/ulcerated plaque. No significant iliac obstructive disease bilaterally. Proximal Outflow: Common femoral arteries and femoral bifurcations demonstrate normal patency. Veins: Venous phase imaging was also performed demonstrating normally patent venous structures in the abdomen and pelvis with no anatomic variants. Review of the MIP images confirms the above findings. NON-VASCULAR Lower chest: No acute abnormality. Calcified coronary artery plaque again noted. Hepatobiliary: No focal liver abnormality is seen. Status post cholecystectomy. No biliary dilatation. Pancreas: Unremarkable. No pancreatic ductal dilatation or surrounding inflammatory changes. Spleen: Normal in size without focal abnormality. Adrenals/Urinary Tract: Unremarkable adrenal glands. Stable simple benign cysts of both kidneys. No hydronephrosis, renal calculi or bladder abnormalities. Stomach/Bowel: Stable appearance status post laparoscopic gastric band bariatric surgery. Bowel shows no evidence of obstruction, ileus, inflammation or lesion. The appendix is normal. No free intraperitoneal air. No evidence on arterial or venous phases of imaging to suggest active bleeding in the gastrointestinal tract. Lymphatic: No enlarged abdominal or pelvic lymph nodes. Reproductive: Status post hysterectomy. No adnexal masses. Stable appearance of small retained left ovary. Other: No abdominal wall hernia or abnormality. No abdominopelvic ascites. Musculoskeletal: No acute or significant osseous findings.  IMPRESSION: 1. Interval increase in size of infrarenal abdominal aortic aneurysm from previous measurement of 4.2 x 3.9 cm in 2021 to 4.8 x 4.2 cm currently. If the patient has not been referred previously to vascular surgery, recommend non emergent referral for follow-up as maximal aneurysm size is approaching 5 cm. Recommend follow-up CT/MR every 6 months and vascular consultation. This recommendation follows ACR consensus guidelines: White Paper of the ACR Incidental Findings Committee II on Vascular Findings. J Am Coll Radiol 2013; 10:789-794. 2. No acute findings in the abdomen or pelvis. 3. Coronary atherosclerosis again noted. 4. Stable appearance of laparoscopic gastric band. Electronically Signed   By: Irish Lack M.D.   On: 02/14/2022 09:29      LOS: 1 day    Jacquelin Hawking, MD [facility] 02/16/2022, 8:30 AM   If 7PM-7AM, please contact night-coverage [email or web address]

## 2022-02-16 NOTE — Progress Notes (Signed)
Mobility Specialist - Progress Note   02/16/22 0959  Mobility  HOB Elevated/Bed Position Self regulated  Activity Ambulated with assistance in hallway  Range of Motion/Exercises Active  Level of Assistance Standby assist, set-up cues, supervision of patient - no hands on  Assistive Device Front wheel walker  Distance Ambulated (ft) 80 ft  Activity Response Tolerated well  Transport method Ambulatory  $Mobility charge 1 Mobility   Pt received in bed and agreeable to mobility. Pt to recliner after session with all needs met.    [facility]

## 2022-02-17 DIAGNOSIS — E876 Hypokalemia: Secondary | ICD-10-CM | POA: Diagnosis not present

## 2022-02-17 LAB — COMPREHENSIVE METABOLIC PANEL
ALT: 26 U/L (ref 0–44)
AST: 51 U/L — ABNORMAL HIGH (ref 15–41)
Albumin: 2.8 g/dL — ABNORMAL LOW (ref 3.5–5.0)
Alkaline Phosphatase: 65 U/L (ref 38–126)
Anion gap: 5 (ref 5–15)
BUN: 5 mg/dL — ABNORMAL LOW (ref 8–23)
CO2: 23 mmol/L (ref 22–32)
Calcium: 8.9 mg/dL (ref 8.9–10.3)
Chloride: 109 mmol/L (ref 98–111)
Creatinine, Ser: 0.81 mg/dL (ref 0.44–1.00)
GFR, Estimated: >60 mL/min (ref >60)
Glucose, Bld: 101 mg/dL — ABNORMAL HIGH (ref 70–99)
Potassium: 4 mmol/L (ref 3.5–5.1)
Sodium: 137 mmol/L (ref 135–145)
Total Bilirubin: 0.4 mg/dL (ref 0.3–1.2)
Total Protein: 5.8 g/dL — ABNORMAL LOW (ref 6.5–8.1)

## 2022-02-17 LAB — CBC
HCT: 39 % (ref 36.0–46.0)
Hemoglobin: 12.6 g/dL (ref 12.0–15.0)
MCH: 29 pg (ref 26.0–34.0)
MCHC: 32.3 g/dL (ref 30.0–36.0)
MCV: 89.7 fL (ref 80.0–100.0)
Platelets: 133 10*3/uL — ABNORMAL LOW (ref 150–400)
RBC: 4.35 MIL/uL (ref 3.87–5.11)
RDW: 13.7 % (ref 11.5–15.5)
WBC: 6.4 10*3/uL (ref 4.0–10.5)
nRBC: 0 % (ref 0.0–0.2)

## 2022-02-17 MED ORDER — ORAL CARE MOUTH RINSE: 15.0000 mL | OROMUCOSAL | Status: DC | PRN

## 2022-02-17 NOTE — Plan of Care (Signed)

## 2022-02-17 NOTE — TOC Transition Note (Signed)
Transition of Care [facility]) - CM/SW Discharge Note   Patient Details  Name: Cassandra Rios MRN: [id number] Date of Birth: [birth year]  [facility]) CM/SW Contact:  Roseanne Kaufman, RN Phone Number: 02/17/2022, 10:07 AM   Clinical Narrative:   Prior to admission patient had HHPT with [facility], wants to resume HHPT. Notified Amy with [facility]. Patient's husband Legrand Como will transport her at discharge.   No additional TOC needs at this time.    Final next level of care: [place] Barriers to Discharge: No Barriers Identified   Patient Goals and CMS Choice Patient states their goals for this hospitalization and ongoing recovery are:: return home with home health services CMS Medicare.gov Compare Post Acute Care list provided to:: Patient Represenative (must comment) (Micheal Hartland) Choice offered to / list presented to : Spouse  Discharge Placement                       Discharge Plan and Services In-house Referral: NA Discharge Planning Services: CM Consult Post Acute Care Choice: Home Health          DME Arranged: N/A         HH Arranged: PT HH Agency: [place] Date [place]: 02/17/22 Time HH Agency Contacted: 1005 Representative spoke with at [place]: Amy  Social Determinants of Health ([place]) Interventions     Readmission Risk Interventions     No data to display

## 2022-02-17 NOTE — Care Management Important Message (Signed)
Important Message  Patient Details IM Letter given to the Patient. Name: Remell Giaimo MRN: [id number] Date of Birth: 1950-12-30   Medicare Important Message Given:  Yes     Kerin Salen 02/17/2022, 11:00 AM

## 2022-02-17 NOTE — Discharge Summary (Signed)
Physician Discharge Summary  Cassandra Rios WGN:[id number] DOB: 07/20/1950 DOA: 02/14/2022  PCP: [name], DO  Admit date: 02/14/2022 Discharge date: 02/17/2022  Admitted From: home Disposition:  home  Recommendations for Outpatient Follow-up:  Follow up with PCP in 1-2 weeks  Home Health: PT Equipment/Devices: none  Discharge Condition: stable CODE STATUS: Full code   HPI: Per admitting MD, Cassandra Rios is a 71 y.o. female with medical history significant of AAA, allergic rhinitis, chronic hoarseness, colon polyps, COPD, hyperlipidemia, hypertension, hypothyroidism, multiple sclerosis, class I obesity, history of UGI bleed in May due to NSAIDs induced gastric PUD who is coming to the emergency department due to nausea, vomiting and diarrhea associated with generalized abdominal pain for the past week.  No fever, night sweats, but positive chills.  No hematemesis.  No melena or hematochezia.  Her appetite is decreased.  She denied fever, chills, rhinorrhea, sore throat, wheezing or hemoptysis.  No chest pain, palpitations, diaphoresis, PND, orthopnea or recent pitting edema of the lower extremities.  Believe that no flank pain, dysuria, frequency or hematuria.  No polyuria, polydipsia, polyphagia or blurred vision.  She has not been taking her medications since last week to avoid GI upset or dehydration from the diuretic.  Hospital Course / Discharge diagnoses: Principal Problem:   Hypokalemia Active Problems:   Depression with anxiety   AAA (abdominal aortic aneurysm) (HCC)   CAD (coronary artery disease), native coronary artery   COPD (chronic obstructive pulmonary disease) (HCC)   Chronic back pain   (HFpEF) heart failure with preserved ejection fraction (HCC)   Class 1 obesity   Acute gastroenteritis  Principal problem Acute viral gastroenteritis-patient was admitted to the hospital with significant hypokalemia, dehydration in the setting of diarrheal  illness.  She was treated conservatively with good liquid diet, IV fluid, a GI pathogen panel and C. difficile could not be sent because her diarrhea resolved after admission.  She is gradually improving, no longer has diarrhea, he is tolerating a diet and will be discharged home in stable condition.  We will briefly discontinue the PPI until she fully recovers  Active problems Hypokalemia-repleted adequately, potassium 4.0 on discharge, she no longer has diarrhea. Depression, Anxiety -Continue home Prozac CAD -resume home medications  COPD -Stable. Chronic back pain-Continue Norco Chronic diastolic heart failure -euvolemic, resume home medications Thrombocytopenia -Mild, related to acute illness. Obesity -Body mass index is 34.06 kg/m.  She would benefit from weight loss  Sepsis ruled out   Discharge Instructions   Allergies as of 02/17/2022       Reactions   Codeine Itching, Nausea And Vomiting   Hydromorphone Hcl Nausea And Vomiting   Ibuprofen Other (See Comments)   stomach ulcers   Morphine Sulfate    "makes my blood pressure bottom out"   Metronidazole Hives, Nausea And Vomiting, Rash        Medication List     STOP taking these medications    nitroGLYCERIN 0.4 MG SL tablet Commonly known as: NITROSTAT   pantoprazole 40 MG tablet Commonly known as: PROTONIX   propranolol 10 MG tablet Commonly known as: INDERAL       TAKE these medications    acetaminophen 500 MG tablet Commonly known as: TYLENOL Take 1,000 mg by mouth every 6 (six) hours as needed for moderate pain or headache.   atorvastatin 80 MG tablet Commonly known as: LIPITOR Take 1 tablet (80 mg total) by mouth daily.   clobetasol ointment 0.05 %  Commonly known as: TEMOVATE Apply topically 2 (two) times daily.   FLUoxetine 40 MG capsule Commonly known as: PROZAC Take 1 capsule (40 mg total) by mouth daily.   gabapentin 300 MG capsule Commonly known as: NEURONTIN Take 600 mg by mouth  every evening.   hydrochlorothiazide 25 MG tablet Commonly known as: HYDRODIURIL TAKE 1 TABLET (25 MG TOTAL) BY MOUTH DAILY.   HYDROcodone-acetaminophen 5-325 MG tablet Commonly known as: NORCO/VICODIN Take 1 tablet by mouth every 6 (six) hours as needed for moderate pain.   Interferon Beta-1b 0.3 MG Kit injection Commonly known as: BETASERON/EXTAVIA Inject 0.25 mg into the skin every other day.   levothyroxine 112 MCG tablet Commonly known as: SYNTHROID Take 1 tablet (112 mcg total) by mouth daily before breakfast.   losartan 100 MG tablet Commonly known as: COZAAR Take 1 tablet (100 mg total) by mouth daily.   meclizine 12.5 MG tablet Commonly known as: ANTIVERT Take 1 tablet (12.5 mg total) by mouth 3 (three) times daily as needed for dizziness (vertigo).   mometasone 50 MCG/ACT nasal spray Commonly known as: NASONEX Place 2 sprays into the nose daily as needed (allergies).   ondansetron 4 MG tablet Commonly known as: Zofran Take 1 tablet (4 mg total) by mouth every 8 (eight) hours as needed for nausea or vomiting.   silver sulfADIAZINE 1 % cream Commonly known as: SILVADENE Apply 1 application. topically daily as needed (irritated areas under abdomen).   SYSTANE ULTRA OP Place 1 drop into the right eye daily as needed (dry eye).   traMADol 50 MG tablet Commonly known as: ULTRAM Take 50 mg by mouth every 8 (eight) hours as needed.   triamcinolone ointment 0.1 % Commonly known as: KENALOG Apply 1 application. topically daily as needed (rash).         Consultations: none  Procedures/Studies:  CT Angio Abd/Pel W and/or Wo Contrast  Result Date: 02/14/2022 CLINICAL DATA:  Vomiting, diarrhea and nausea. Recent GI bleed. Known history of abdominal aortic aneurysm. EXAM: CT ANGIOGRAPHY ABDOMEN AND PELVIS WITH CONTRAST AND WITHOUT CONTRAST TECHNIQUE: Multidetector CT imaging of the abdomen and pelvis was performed using the standard protocol during bolus  administration of intravenous contrast. Multiplanar reconstructed images and MIPs were obtained and reviewed to evaluate the vascular anatomy. RADIATION DOSE REDUCTION: This exam was performed according to the departmental dose-optimization program which includes automated exposure control, adjustment of the mA and/or kV according to patient size and/or use of iterative reconstruction technique. CONTRAST:  80mL OMNIPAQUE IOHEXOL 350 MG/ML SOLN COMPARISON:  Prior CTA of the abdomen and pelvis on 03/17/2020 FINDINGS: VASCULAR Aorta: Infrarenal abdominal aortic aneurysm demonstrates increased in size since the prior CTA in 2021. Maximal transverse dimensions on axial image are now approximately 4.2 x 4.8 cm on image 119/4 compared to 4.2 x 3.9 cm previously. No evidence of aneurysm rupture or aortic dissection. Celiac: Stable and normal patency with minimal atherosclerosis. Normally patent branch vessels and branch vessel anatomy. SMA: Stable atherosclerosis at origin without significant stenosis. Normally patent trunk and visualized branch vessels. Renals: Stable patency and mild atherosclerosis of single left and paired right renal arteries. 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 Distal branch vessels are reconstituted by SMA collateral supply. 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 Stable focal dilatation of the mid left common iliac artery with segment measuring 17 mm in diameter at a level of penetrating ulcer disease/ulcerated plaque.  No significant iliac obstructive disease bilaterally. Proximal Outflow: Common femoral arteries and femoral bifurcations demonstrate normal patency. Veins: Venous phase imaging was also performed demonstrating normally patent venous structures in the abdomen and pelvis with no anatomic variants. Review of the MIP images confirms the above findings. NON-VASCULAR Lower chest: No acute  abnormality. Calcified coronary artery plaque again noted. Hepatobiliary: No focal liver abnormality is seen. Status post cholecystectomy. No biliary dilatation. Pancreas: Unremarkable. No pancreatic ductal dilatation or surrounding inflammatory changes. Spleen: Normal in size without focal abnormality. Adrenals/Urinary Tract: Unremarkable adrenal glands. Stable simple benign cysts of both kidneys. No hydronephrosis, renal calculi or bladder abnormalities. Stomach/Bowel: Stable appearance status post laparoscopic gastric band bariatric surgery. Bowel shows no evidence of obstruction, ileus, inflammation or lesion. The appendix is normal. No free intraperitoneal air. No evidence on arterial or venous phases of imaging to suggest active bleeding in the gastrointestinal tract. Lymphatic: No enlarged abdominal or pelvic lymph nodes. Reproductive: Status post hysterectomy. No adnexal masses. Stable appearance of small retained left ovary. Other: No abdominal wall hernia or abnormality. No abdominopelvic ascites. Musculoskeletal: No acute or significant osseous findings. IMPRESSION: 1. Interval increase in size of infrarenal abdominal aortic aneurysm from previous measurement of 4.2 x 3.9 cm in 2021 to 4.8 x 4.2 cm currently. If the patient has not been referred previously to vascular surgery, recommend non emergent referral for follow-up as maximal aneurysm size is approaching 5 cm. Recommend follow-up CT/MR every 6 months and vascular consultation. This recommendation follows ACR consensus guidelines: White Paper of the ACR Incidental Findings Committee II on Vascular Findings. J Am Coll Radiol 2013; 10:789-794. 2. No acute findings in the abdomen or pelvis. 3. Coronary atherosclerosis again noted. 4. Stable appearance of laparoscopic gastric band. Electronically Signed   By: Irish Lack M.D.   On: 02/14/2022 09:29     Subjective: - no chest pain, shortness of breath, no abdominal pain, nausea or vomiting.    Discharge Exam: BP (!) 187/91 (BP Location: Right Arm)   Pulse 62   Temp 97.9 F (36.6 C) (Oral)   Resp 17   Ht 5\' 6"  (1.676 m)   Wt 95.7 kg   SpO2 97%   BMI 34.06 kg/m   General: Pt is alert, awake, not in acute distress Cardiovascular: RRR, S1/S2 +, no rubs, no gallops Respiratory: CTA bilaterally, no wheezing, no rhonchi Abdominal: Soft, NT, ND, bowel sounds + Extremities: no edema, no cyanosis    The results of significant diagnostics from this hospitalization (including imaging, microbiology, ancillary and laboratory) are listed below for reference.     Microbiology: Recent Results (from the past 240 hour(s))  Resp Panel by RT-PCR (Flu A&B, Covid) Anterior Nasal Swab     Status: None   Collection Time: 02/14/22  8:40 AM   Specimen: Anterior Nasal Swab  Result Value Ref Range Status   SARS Coronavirus 2 by RT PCR NEGATIVE NEGATIVE Final    Comment: (NOTE) SARS-CoV-2 target nucleic acids are NOT DETECTED.  The SARS-CoV-2 RNA is generally detectable in upper respiratory specimens during the acute phase of infection. The lowest concentration of SARS-CoV-2 viral copies this assay can detect is 138 copies/mL. A negative result does not preclude SARS-Cov-2 infection and should not be used as the sole basis for treatment or other patient management decisions. A negative result may occur with  improper specimen collection/handling, submission of specimen other than nasopharyngeal swab, presence of viral mutation(s) within the areas targeted by this assay, and inadequate number of viral  copies(<138 copies/mL). A negative result must be combined with clinical observations, patient history, and epidemiological information. The expected result is Negative.  Fact Sheet for Patients:  BloggerCourse.com  Fact Sheet for Healthcare Providers:  SeriousBroker.it  This test is no t yet approved or cleared by the [place]  FDA and  has been authorized for detection and/or diagnosis of SARS-CoV-2 by FDA under an Emergency Use Authorization (EUA). This EUA will remain  in effect (meaning this test can be used) for the duration of the COVID-19 declaration under Section 564(b)(1) of the Act, 21 U.S.C.section 360bbb-3(b)(1), unless the authorization is terminated  or revoked sooner.       Influenza A by PCR NEGATIVE NEGATIVE Final   Influenza B by PCR NEGATIVE NEGATIVE Final    Comment: (NOTE) The Xpert Xpress SARS-CoV-2/FLU/RSV plus assay is intended as an aid in the diagnosis of influenza from Nasopharyngeal swab specimens and should not be used as a sole basis for treatment. Nasal washings and aspirates are unacceptable for Xpert Xpress SARS-CoV-2/FLU/RSV testing.  Fact Sheet for Patients: BloggerCourse.com  Fact Sheet for Healthcare Providers: SeriousBroker.it  This test is not yet approved or cleared by the [place] FDA and has been authorized for detection and/or diagnosis of SARS-CoV-2 by FDA under an Emergency Use Authorization (EUA). This EUA will remain in effect (meaning this test can be used) for the duration of the COVID-19 declaration under Section 564(b)(1) of the Act, 21 U.S.C. section 360bbb-3(b)(1), unless the authorization is terminated or revoked.  Performed at [facility], [address] [place], [place] [address]      Labs: Basic Metabolic Panel: Recent Labs  Lab 02/14/22 0745 02/14/22 0746 02/15/22 0355 02/17/22 0754  NA  --  136 138 137  K  --  2.6* 3.7 4.0  CL  --  96* 112* 109  CO2  --  24 20* 23  GLUCOSE  --  130* 89 101*  BUN  --  12 8 5*  CREATININE  --  1.39* 1.10* 0.81  CALCIUM  --  9.6 8.6* 8.9  MG 1.4*  --   --   --    Liver Function Tests: Recent Labs  Lab 02/14/22 0746 02/15/22 0355 02/17/22 0754  AST 88* 70* 51*  ALT 28 26 26   ALKPHOS 77 60 65  BILITOT 0.9 0.8 0.4   PROT 7.2 6.0* 5.8*  ALBUMIN 4.0 2.8* 2.8*   CBC: Recent Labs  Lab 02/14/22 0746 02/15/22 0355 02/16/22 0936 02/17/22 0754  WBC 8.2 7.8  --  6.4  HGB 15.0 12.4  --  12.6  HCT 43.6 37.7  --  39.0  MCV 83.2 87.7  --  89.7  PLT 185 127* 137* 133*   CBG: No results for input(s): "GLUCAP" in the last 168 hours. Hgb A1c No results for input(s): "HGBA1C" in the last 72 hours. Lipid Profile No results for input(s): "CHOL", "HDL", "LDLCALC", "TRIG", "CHOLHDL", "LDLDIRECT" in the last 72 hours. Thyroid function studies No results for input(s): "TSH", "T4TOTAL", "T3FREE", "THYROIDAB" in the last 72 hours.  Invalid input(s): "FREET3" Urinalysis    Component Value Date/Time   COLORURINE COLORLESS (A) 02/14/2022 0745   APPEARANCEUR CLEAR 02/14/2022 0745   LABSPEC 1.010 02/14/2022 0745   PHURINE 7.5 02/14/2022 0745   GLUCOSEU NEGATIVE 02/14/2022 0745   HGBUR NEGATIVE 02/14/2022 0745   HGBUR negative 01/17/2010 0753   BILIRUBINUR NEGATIVE 02/14/2022 0745   BILIRUBINUR NEG 01/12/2022 1617   KETONESUR NEGATIVE 02/14/2022 0745  PROTEINUR NEGATIVE 02/14/2022 0745   UROBILINOGEN 0.2 01/12/2022 1617   UROBILINOGEN 0.2 01/17/2010 0753   NITRITE NEGATIVE 02/14/2022 0745   LEUKOCYTESUR SMALL (A) 02/14/2022 0745    FURTHER DISCHARGE INSTRUCTIONS:   Get Medicines reviewed and adjusted: Please take all your medications with you for your next visit with your Primary MD   Laboratory/radiological data: Please request your Primary MD to go over all hospital tests and procedure/radiological results at the follow up, please ask your Primary MD to get all Hospital records sent to his/her office.   In some cases, they will be blood work, cultures and biopsy results pending at the time of your discharge. Please request that your primary care M.D. goes through all the records of your hospital data and follows up on these results.   Also Note the following: If you experience worsening of your  admission symptoms, develop shortness of breath, life threatening emergency, suicidal or homicidal thoughts you must seek medical attention immediately by calling 911 or calling your MD immediately  if symptoms less severe.   You must read complete instructions/literature along with all the possible adverse reactions/side effects for all the Medicines you take and that have been prescribed to you. Take any new Medicines after you have completely understood and accpet all the possible adverse reactions/side effects.    Do not drive when taking Pain medications or sleeping medications (Benzodaizepines)   Do not take more than prescribed Pain, Sleep and Anxiety Medications. It is not advisable to combine anxiety,sleep and pain medications without talking with your primary care practitioner   Special Instructions: If you have smoked or chewed Tobacco  in the last 2 yrs please stop smoking, stop any regular Alcohol  and or any Recreational drug use.   Wear Seat belts while driving.   Please note: You were cared for by a hospitalist during your hospital stay. Once you are discharged, your primary care physician will handle any further medical issues. Please note that NO REFILLS for any discharge medications will be authorized once you are discharged, as it is imperative that you return to your primary care physician (or establish a relationship with a primary care physician if you do not have one) for your post hospital discharge needs so that they can reassess your need for medications and monitor your lab values.  Time coordinating discharge: 40 minutes  SIGNED:  Pamella Pert, MD, PhD 02/17/2022, 9:26 AM

## 2022-02-20 ENCOUNTER — Telehealth: Payer: Self-pay

## 2022-02-20 NOTE — Telephone Encounter (Signed)
Transition Care Management Follow-up Telephone Call Date of discharge and from where: [place] 02-17-22 Dx: hypokalemia How have you been since you were released from the hospital? Doing good  Any questions or concerns? No  Items Reviewed: Did the pt receive and understand the discharge instructions provided? Yes  Medications obtained and verified? Yes  Other? No  Any new allergies since your discharge? No  Dietary orders reviewed? Yes Do you have support at home? Yes   Home Care and Equipment/Supplies: Were home health services ordered? Yes PT If so, what is the name of the agency? [facility]  Has the agency set up a time to come to the patient's home? yes Were any new equipment or medical supplies ordered?  No What is the name of the medical supply agency? [facility] Were you able to get the supplies/equipment? not applicable Do you have any questions related to the use of the equipment or supplies? No  Functional Questionnaire: (I = Independent and D = Dependent) ADLs: I  Bathing/Dressing- I  Meal Prep- I  Eating- I  Maintaining continence- I  Transferring/Ambulation- I-WALKER  Managing Meds- I  Follow up appointments reviewed:  PCP Hospital f/u appt confirmed? Yes  Scheduled to see Roma Schanz DO on 02-27-22 @ 1140am. [facility] f/u appt confirmed? No . Are transportation arrangements needed? No  If their condition worsens, is the pt aware to call PCP or go to the Emergency Dept.? Yes Was the patient provided with contact information for the PCP's office or ED? Yes Was to pt encouraged to call back with questions or concerns? Yes

## 2022-02-27 ENCOUNTER — Ambulatory Visit (INDEPENDENT_AMBULATORY_CARE_PROVIDER_SITE_OTHER): Payer: PPO | Admitting: Family Medicine

## 2022-02-27 ENCOUNTER — Encounter: Payer: Self-pay | Admitting: Family Medicine

## 2022-02-27 VITALS — BP 108/80 | HR 76 | Temp 97.7°F | Resp 18 | Ht 66.0 in | Wt 212.2 lb

## 2022-02-27 DIAGNOSIS — E876 Hypokalemia: Secondary | ICD-10-CM | POA: Diagnosis not present

## 2022-02-27 DIAGNOSIS — E039 Hypothyroidism, unspecified: Secondary | ICD-10-CM

## 2022-02-27 DIAGNOSIS — I10 Essential (primary) hypertension: Secondary | ICD-10-CM | POA: Diagnosis not present

## 2022-02-27 DIAGNOSIS — Z23 Encounter for immunization: Secondary | ICD-10-CM | POA: Diagnosis not present

## 2022-02-27 DIAGNOSIS — E785 Hyperlipidemia, unspecified: Secondary | ICD-10-CM | POA: Diagnosis not present

## 2022-02-27 DIAGNOSIS — I2510 Atherosclerotic heart disease of native coronary artery without angina pectoris: Secondary | ICD-10-CM | POA: Diagnosis not present

## 2022-02-27 LAB — CBC WITH DIFFERENTIAL/PLATELET
Basophils Absolute: 0 10*3/uL (ref 0.0–0.1)
Basophils Relative: 0.6 % (ref 0.0–3.0)
Eosinophils Absolute: 0.1 10*3/uL (ref 0.0–0.7)
Eosinophils Relative: 1.5 % (ref 0.0–5.0)
HCT: 38.4 % (ref 36.0–46.0)
Hemoglobin: 12.9 g/dL (ref 12.0–15.0)
Lymphocytes Relative: 21.5 % (ref 12.0–46.0)
Lymphs Abs: 1.5 10*3/uL (ref 0.7–4.0)
MCHC: 33.6 g/dL (ref 30.0–36.0)
MCV: 87 fl (ref 78.0–100.0)
Monocytes Absolute: 0.6 10*3/uL (ref 0.1–1.0)
Monocytes Relative: 7.9 % (ref 3.0–12.0)
Neutro Abs: 4.8 10*3/uL (ref 1.4–7.7)
Neutrophils Relative %: 68.5 % (ref 43.0–77.0)
Platelets: 149 10*3/uL — ABNORMAL LOW (ref 150.0–400.0)
RBC: 4.41 Mil/uL (ref 3.87–5.11)
RDW: 14.7 % (ref 11.5–15.5)
WBC: 7 10*3/uL (ref 4.0–10.5)

## 2022-02-27 LAB — COMPREHENSIVE METABOLIC PANEL
ALT: 22 U/L (ref 0–35)
AST: 42 U/L — ABNORMAL HIGH (ref 0–37)
Albumin: 3.5 g/dL (ref 3.5–5.2)
Alkaline Phosphatase: 71 U/L (ref 39–117)
BUN: 10 mg/dL (ref 6–23)
CO2: 27 mEq/L (ref 19–32)
Calcium: 9 mg/dL (ref 8.4–10.5)
Chloride: 98 mEq/L (ref 96–112)
Creatinine, Ser: 1 mg/dL (ref 0.40–1.20)
GFR: 56.46 mL/min — ABNORMAL LOW (ref >60.00)
Glucose, Bld: 99 mg/dL (ref 70–99)
Potassium: 3.4 mEq/L — ABNORMAL LOW (ref 3.5–5.1)
Sodium: 137 mEq/L (ref 135–145)
Total Bilirubin: 0.9 mg/dL (ref 0.2–1.2)
Total Protein: 6.2 g/dL (ref 6.0–8.3)

## 2022-02-27 LAB — LIPID PANEL
Cholesterol: 129 mg/dL (ref 0–200)
HDL: 40.5 mg/dL (ref >39.00)
LDL Cholesterol: 62 mg/dL (ref 0–99)
NonHDL: 88.64
Total CHOL/HDL Ratio: 3
Triglycerides: 133 mg/dL (ref 0.0–149.0)
VLDL: 26.6 mg/dL (ref 0.0–40.0)

## 2022-02-27 LAB — TSH: TSH: 1.75 u[IU]/mL (ref 0.35–5.50)

## 2022-02-27 NOTE — Assessment & Plan Note (Signed)
Check labs today F/u cardiology

## 2022-02-27 NOTE — Patient Instructions (Signed)
Hypokalemia Hypokalemia means that the amount of potassium in the blood is lower than normal. Potassium is a mineral (electrolyte) that helps regulate the amount of fluid in the body. It also stimulates muscle tightening (contraction) and helps nerves work properly. Normally, most of the body's potassium is inside cells, and only a very small amount is in the blood. Because the amount in the blood is so small, minor changes to potassium levels in the blood can be life-threatening. What are the causes? This condition may be caused by: Antibiotic medicine. Diarrhea or vomiting. Taking too much of a medicine that helps you have a bowel movement (laxative) can cause diarrhea and lead to hypokalemia. Chronic kidney disease (CKD). Medicines that help the body get rid of excess fluid (diuretics). Eating disorders, such as anorexia or bulimia. Low magnesium levels in the body. Sweating a lot. What are the signs or symptoms? Symptoms of this condition include: Weakness. Constipation. Fatigue. Muscle cramps. Mental confusion. Skipped heartbeats or irregular heartbeat (palpitations). Tingling or numbness. How is this diagnosed? This condition is diagnosed with a blood test. How is this treated? This condition may be treated by: Taking potassium supplements. Adjusting the medicines that you take. Eating more foods that contain a lot of potassium. If your potassium level is very low, you may need to get potassium through an IV and be monitored in the hospital. Follow these instructions at home: Eating and drinking  Eat a healthy diet. A healthy diet includes fresh fruits and vegetables, whole grains, healthy fats, and lean proteins. If told, eat more foods that contain a lot of potassium. These include: Nuts, such as peanuts and pistachios. Seeds, such as sunflower seeds and pumpkin seeds. Peas, lentils, and lima beans. Whole grain and bran cereals and breads. Fresh fruits and vegetables,  such as apricots, avocado, bananas, cantaloupe, kiwi, oranges, tomatoes, asparagus, and potatoes. Juices, such as orange, tomato, and prune. Lean meats, including fish. Milk and milk products, such as yogurt. General instructions Take over-the-counter and prescription medicines only as told by your health care provider. This includes vitamins, natural food products, and supplements. Keep all follow-up visits. This is important. Contact a health care provider if: You have weakness that gets worse. You feel your heart pounding or racing. You vomit. You have diarrhea. You have diabetes and you have trouble keeping your blood sugar in your target range. Get help right away if: You have chest pain. You have shortness of breath. You have vomiting or diarrhea that lasts for more than 2 days. You faint. These symptoms may be an emergency. Get help right away. Call 911. Do not wait to see if the symptoms will go away. Do not drive yourself to the hospital. Summary Hypokalemia means that the amount of potassium in the blood is lower than normal. This condition is diagnosed with a blood test. Hypokalemia may be treated by taking potassium supplements, adjusting the medicines that you take, or eating more foods that are high in potassium. If your potassium level is very low, you may need to get potassium through an IV and be monitored in the hospital. This information is not intended to replace advice given to you by your health care provider. Make sure you discuss any questions you have with your health care provider. Document Revised: 02/03/2021 Document Reviewed: 02/03/2021 Elsevier Patient Education  2023 Elsevier Inc.  

## 2022-02-27 NOTE — Progress Notes (Signed)
Subjective:   By signing my name below, I, Cassell Clement, attest that this documentation has been prepared under the direction and in the presence of Seabron Spates R DO 02/27/2022.   Patient ID: Cassandra Rios, female    DOB: Jan 25, 1951, 71 y.o.   MRN: [id number]  Chief Complaint  Patient presents with   Hospitalization Follow-up    Pt states still not feeling well but states no nausea or vomiting.     HPI Patient is in today for office visit.  She was admitted to the ED on 02/14/2022 for diarrhea. She was discharged on 02/17/2022 with a diagnosis of Gastroenteritis. Her diarrhea symptoms are improving since discharge. She reports that she is no longer taking Protonix but is drinking Gatorade and occasionally water. She is no longer doing physical therapy due to her symptoms. She is interested in waiting a week before restarting therapy.   She reports that her vertigo symptoms are improving. She takes 12.5 Mg of Meclizine PRN  She reports that her 4 Mg of Zofran was not filled at the pharmacy due to lack of prior of authorization. She is no longer taking the medication  She is interested in receiving an influenza vaccine during today's visit  She reports that her aneurysm has increased in size. She is regularly following up with her vascular specialist, Dr.Cain.  Past Medical History:  Diagnosis Date   AAA (abdominal aortic aneurysm) (HCC)    Allergic rhinitis    Chronic hoarseness    Colon polyps    COPD (chronic obstructive pulmonary disease) (HCC)    Dr. Marchelle Gearing   Dyslipidemia    Gastric bleed 10/2021   stomach ulcers   Hypertension    Hypothyroidism    MS (multiple sclerosis) (HCC)    Obesity    Stomach ulcer from aspirin/ibuprofen-like drugs (NSAID's)     Past Surgical History:  Procedure Laterality Date   ABDOMINAL HYSTERECTOMY     BARIATRIC SURGERY  09/07/08   lap band   CARPAL TUNNEL RELEASE     CATARACT EXTRACTION Right 11/08/2017   CATARACT  EXTRACTION Left 11/22/2017   CATARACT EXTRACTION, BILATERAL      Toric lenses for astigmatism   CHOLECYSTECTOMY     CHOLECYSTECTOMY     ESOPHAGOGASTRODUODENOSCOPY (EGD) WITH PROPOFOL N/A 10/17/2021   Procedure: ESOPHAGOGASTRODUODENOSCOPY (EGD) WITH PROPOFOL;  Surgeon: Willis Modena, MD;  Location: [facility] ENDOSCOPY;  Service: Gastroenterology;  Laterality: N/A;   HIATAL HERNIA REPAIR  09/07/08   LEFT HEART CATH AND CORONARY ANGIOGRAPHY N/A 07/09/2019   Procedure: LEFT HEART CATH AND CORONARY ANGIOGRAPHY;  Surgeon: Lyn Records, MD;  Location: [facility] INVASIVE CV LAB;  Service: Cardiovascular;  Laterality: N/A;    Family History  Problem Relation Age of Onset   Stroke Mother 18   Heart disease Mother        congenital heart defect?   COPD Father    Heart disease Father    AAA (abdominal aortic aneurysm) Father    COPD Sister    Arthritis Sister    AAA (abdominal aortic aneurysm) Sister    Dementia Sister    Other Sister        brain injury   Multiple sclerosis Neg Hx     Social History   Socioeconomic History   Marital status: Married    Spouse name: Casimiro Needle   Number of children: 2   Years of education: 16   Highest education level: Not on file  Occupational History  Occupation: retired  Tobacco Use   Smoking status: Former    Packs/day: 2.00    Years: 40.00    Total pack years: 80.00    Types: Cigarettes    Quit date: 04/05/2007    Years since quitting: 14.9    Passive exposure: Current (Husband)   Smokeless tobacco: Never  Vaping Use   Vaping Use: Not on file  Substance and Sexual Activity   Alcohol use: No   Drug use: No   Sexual activity: Never    Partners: Male  Other Topics Concern   Not on file  Social History Narrative   Patient is right handed,reside in home with husband   Social Determinants of Health   Financial Resource Strain: Not on file  Food Insecurity: No Food Insecurity (02/14/2022)   Hunger Vital Sign    Worried About Running Out of Food in the  Last Year: Never true    Ran Out of Food in the Last Year: Never true  Transportation Needs: No Transportation Needs (02/14/2022)   PRAPARE - Administrator, Civil Service (Medical): No    Lack of Transportation (Non-Medical): No  Physical Activity: Inactive (04/19/2018)   Exercise Vital Sign    Days of Exercise per Week: 0 days    Minutes of Exercise per Session: 0 min  Stress: No Stress Concern Present (04/19/2018)   [employer] of Occupational Health - Occupational Stress Questionnaire    Feeling of Stress : Not at all  Social Connections: Not on file  Intimate Partner Violence: Not At Risk (02/14/2022)   Humiliation, Afraid, Rape, and Kick questionnaire    Fear of Current or Ex-Partner: No    Emotionally Abused: No    Physically Abused: No    Sexually Abused: No    Outpatient Medications Prior to Visit  Medication Sig Dispense Refill   acetaminophen (TYLENOL) 500 MG tablet Take 1,000 mg by mouth every 6 (six) hours as needed for moderate pain or headache.     atorvastatin (LIPITOR) 80 MG tablet Take 1 tablet (80 mg total) by mouth daily. 90 tablet 3   clobetasol ointment (TEMOVATE) 0.05 % Apply topically 2 (two) times daily.     FLUoxetine (PROZAC) 40 MG capsule Take 1 capsule (40 mg total) by mouth daily. 90 capsule 3   gabapentin (NEURONTIN) 300 MG capsule Take 600 mg by mouth every evening.      hydrochlorothiazide (HYDRODIURIL) 25 MG tablet TAKE 1 TABLET (25 MG TOTAL) BY MOUTH DAILY. 30 tablet 4   Interferon Beta-1b (BETASERON/EXTAVIA) 0.3 MG KIT injection Inject 0.25 mg into the skin every other day.     levothyroxine (SYNTHROID) 112 MCG tablet Take 1 tablet (112 mcg total) by mouth daily before breakfast. 90 tablet 1   losartan (COZAAR) 100 MG tablet Take 1 tablet (100 mg total) by mouth daily. 90 tablet 1   meclizine (ANTIVERT) 12.5 MG tablet Take 1 tablet (12.5 mg total) by mouth 3 (three) times daily as needed for dizziness (vertigo). 90 tablet 6    mometasone (NASONEX) 50 MCG/ACT nasal spray Place 2 sprays into the nose daily as needed (allergies).     Polyethyl Glycol-Propyl Glycol (SYSTANE ULTRA OP) Place 1 drop into the right eye daily as needed (dry eye).      silver sulfADIAZINE (SILVADENE) 1 % cream Apply 1 application. topically daily as needed (irritated areas under abdomen).     triamcinolone ointment (KENALOG) 0.1 % Apply 1 application. topically daily as needed (rash).  ondansetron (ZOFRAN) 4 MG tablet Take 1 tablet (4 mg total) by mouth every 8 (eight) hours as needed for nausea or vomiting. 20 tablet 0   HYDROcodone-acetaminophen (NORCO/VICODIN) 5-325 MG tablet Take 1 tablet by mouth every 6 (six) hours as needed for moderate pain. (Patient not taking: Reported on 02/20/2022)     traMADol (ULTRAM) 50 MG tablet Take 50 mg by mouth every 8 (eight) hours as needed. (Patient not taking: Reported on 02/20/2022)     No facility-administered medications prior to visit.    Allergies  Allergen Reactions   Codeine Itching and Nausea And Vomiting   Hydromorphone Hcl Nausea And Vomiting   Ibuprofen Other (See Comments)    stomach ulcers   Morphine Sulfate     "makes my blood pressure bottom out"   Metronidazole Hives, Nausea And Vomiting and Rash    Review of Systems  Constitutional:  Negative for fever and malaise/fatigue.  HENT:  Negative for congestion.   Eyes:  Negative for blurred vision.  Respiratory:  Negative for shortness of breath.   Cardiovascular:  Negative for chest pain, palpitations and leg swelling.  Gastrointestinal:  Negative for abdominal pain, blood in stool, constipation, diarrhea, nausea and vomiting.  Genitourinary:  Negative for dysuria and frequency.  Musculoskeletal:  Negative for falls.  Skin:  Negative for rash.  Neurological:  Negative for dizziness, loss of consciousness and headaches.  Endo/Heme/Allergies:  Negative for environmental allergies.  Psychiatric/Behavioral:  Negative for depression.  The patient is not nervous/anxious.        Objective:    Physical Exam Vitals and nursing note reviewed.  Constitutional:      General: She is not in acute distress.    Appearance: Normal appearance. She is not ill-appearing.  HENT:     Head: Normocephalic and atraumatic.     Right Ear: External ear normal.     Left Ear: External ear normal.  Eyes:     Extraocular Movements: Extraocular movements intact.     Pupils: Pupils are equal, round, and reactive to light.  Cardiovascular:     Rate and Rhythm: Normal rate and regular rhythm.     Heart sounds: Normal heart sounds. No murmur heard.    No gallop.  Pulmonary:     Effort: Pulmonary effort is normal. No respiratory distress.     Breath sounds: Normal breath sounds. No wheezing or rales.  Abdominal:     General: There is no distension.     Tenderness: There is no abdominal tenderness. There is no guarding or rebound.  Skin:    General: Skin is warm and dry.  Neurological:     Mental Status: She is alert and oriented to person, place, and time.  Psychiatric:        Judgment: Judgment normal.     BP 108/80 (BP Location: Left Arm, Patient Position: Sitting, Cuff Size: Large)   Pulse 76   Temp 97.7 F (36.5 C) (Oral)   Resp 18   Ht 5\' 6"  (1.676 m)   Wt 212 lb 3.2 oz (96.3 kg)   SpO2 99%   BMI 34.25 kg/m  Wt Readings from Last 3 Encounters:  02/27/22 212 lb 3.2 oz (96.3 kg)  02/14/22 211 lb (95.7 kg)  01/12/22 218 lb (98.9 kg)    Diabetic Foot Exam - Simple   No data filed    Lab Results  Component Value Date   WBC 6.4 02/17/2022   HGB 12.6 02/17/2022   HCT 39.0 02/17/2022  PLT 133 (L) 02/17/2022   GLUCOSE 101 (H) 02/17/2022   CHOL 116 11/18/2021   TRIG 96.0 11/18/2021   HDL 49.70 11/18/2021   LDLCALC 47 11/18/2021   ALT 26 02/17/2022   AST 51 (H) 02/17/2022   NA 137 02/17/2022   K 4.0 02/17/2022   CL 109 02/17/2022   CREATININE 0.81 02/17/2022   BUN 5 (L) 02/17/2022   CO2 23 02/17/2022   TSH  3.00 11/18/2021   INR 1.1 10/17/2021   HGBA1C 5.9 09/25/2016   MICROALBUR 2.5 (H) 09/15/2014    Lab Results  Component Value Date   TSH 3.00 11/18/2021   Lab Results  Component Value Date   WBC 6.4 02/17/2022   HGB 12.6 02/17/2022   HCT 39.0 02/17/2022   MCV 89.7 02/17/2022   PLT 133 (L) 02/17/2022   Lab Results  Component Value Date   NA 137 02/17/2022   K 4.0 02/17/2022   CO2 23 02/17/2022   GLUCOSE 101 (H) 02/17/2022   BUN 5 (L) 02/17/2022   CREATININE 0.81 02/17/2022   BILITOT 0.4 02/17/2022   ALKPHOS 65 02/17/2022   AST 51 (H) 02/17/2022   ALT 26 02/17/2022   PROT 5.8 (L) 02/17/2022   ALBUMIN 2.8 (L) 02/17/2022   CALCIUM 8.9 02/17/2022   ANIONGAP 5 02/17/2022   EGFR 44 (L) 01/11/2022   GFR 49.32 (L) 01/20/2022   Lab Results  Component Value Date   CHOL 116 11/18/2021   Lab Results  Component Value Date   HDL 49.70 11/18/2021   Lab Results  Component Value Date   LDLCALC 47 11/18/2021   Lab Results  Component Value Date   TRIG 96.0 11/18/2021   Lab Results  Component Value Date   CHOLHDL 2 11/18/2021   Lab Results  Component Value Date   HGBA1C 5.9 09/25/2016       Assessment & Plan:   Problem List Items Addressed This Visit       Unprioritized   Hyperlipidemia LDL goal <70 (Chronic)    Encourage heart healthy diet such as MIND or DASH diet, increase exercise, avoid trans fats, simple carbohydrates and processed foods, consider a krill or fish or flaxseed oil cap daily.       Essential hypertension (Chronic)    Well controlled, no changes to meds. Encouraged heart healthy diet such as the DASH diet and exercise as tolerated.       CAD (coronary artery disease), native coronary artery (Chronic)    Check labs today F/u cardiology      Hypothyroidism    Check labs  con't synthroid      Relevant Orders   TSH   CBC with Differential/Platelet   Hypokalemia - Primary    Recheck labs today  hosp notes reviewed       Relevant  Orders   Comprehensive metabolic panel   CBC with Differential/Platelet   Hyperlipidemia   Relevant Orders   Lipid panel   Other Visit Diagnoses     Primary hypertension       Relevant Orders   CBC with Differential/Platelet   Need for influenza vaccination       Relevant Orders   Flu Vaccine QUAD High Dose(Fluad) (Completed)        No orders of the defined types were placed in this encounter.   IDonato Schultz, DO, personally preformed the services described in this documentation.  All medical record entries made by the scribe were at my direction and  in my presence.  I have reviewed the chart and discharge instructions (if applicable) and agree that the record reflects my personal performance and is accurate and complete. 02/27/2022   I,Amber Collins,acting as a scribe for [name], DO.,have documented all relevant documentation on the behalf of [name], DO,as directed by  [name], DO while in the presence of [name], DO.    [name], DO

## 2022-02-27 NOTE — Assessment & Plan Note (Signed)
Well controlled, no changes to meds. Encouraged heart healthy diet such as the DASH diet and exercise as tolerated.  °

## 2022-02-27 NOTE — Assessment & Plan Note (Signed)
Encourage heart healthy diet such as MIND or DASH diet, increase exercise, avoid trans fats, simple carbohydrates and processed foods, consider a krill or fish or flaxseed oil cap daily.  °

## 2022-02-27 NOTE — Assessment & Plan Note (Signed)
Check labs con't synthroid 

## 2022-02-27 NOTE — Assessment & Plan Note (Signed)
Recheck labs today  hosp notes reviewed

## 2022-03-08 DIAGNOSIS — E785 Hyperlipidemia, unspecified: Secondary | ICD-10-CM | POA: Diagnosis not present

## 2022-03-08 DIAGNOSIS — I10 Essential (primary) hypertension: Secondary | ICD-10-CM | POA: Diagnosis not present

## 2022-03-08 DIAGNOSIS — L900 Lichen sclerosus et atrophicus: Secondary | ICD-10-CM | POA: Diagnosis not present

## 2022-03-08 DIAGNOSIS — I714 Abdominal aortic aneurysm, without rupture, unspecified: Secondary | ICD-10-CM | POA: Diagnosis not present

## 2022-03-08 DIAGNOSIS — M6281 Muscle weakness (generalized): Secondary | ICD-10-CM | POA: Diagnosis not present

## 2022-03-08 DIAGNOSIS — Z6837 Body mass index (BMI) 37.0-37.9, adult: Secondary | ICD-10-CM | POA: Diagnosis not present

## 2022-03-08 DIAGNOSIS — E669 Obesity, unspecified: Secondary | ICD-10-CM | POA: Diagnosis not present

## 2022-03-08 DIAGNOSIS — G35 Multiple sclerosis: Secondary | ICD-10-CM | POA: Diagnosis not present

## 2022-03-08 DIAGNOSIS — E039 Hypothyroidism, unspecified: Secondary | ICD-10-CM | POA: Diagnosis not present

## 2022-03-16 ENCOUNTER — Telehealth: Payer: Self-pay | Admitting: Acute Care

## 2022-03-20 NOTE — Telephone Encounter (Signed)
Letter has been sent to close [facility] for this patient,  Order for LDCT has been cancelled.

## 2022-03-27 DIAGNOSIS — Z79891 Long term (current) use of opiate analgesic: Secondary | ICD-10-CM | POA: Diagnosis not present

## 2022-03-27 DIAGNOSIS — M5136 Other intervertebral disc degeneration, lumbar region: Secondary | ICD-10-CM | POA: Diagnosis not present

## 2022-05-04 ENCOUNTER — Other Ambulatory Visit: Payer: Self-pay | Admitting: *Deleted

## 2022-05-04 ENCOUNTER — Telehealth: Payer: Self-pay | Admitting: *Deleted

## 2022-05-04 MED ORDER — INTERFERON BETA-1B 0.3 MG ~~LOC~~ KIT: 0.2500 mg | PACK | SUBCUTANEOUS | 3 refills | Status: DC

## 2022-05-04 NOTE — Telephone Encounter (Signed)
Received fax concerning needing new prescription for pt relating to reenrollment for next year for BETASERON 0.3mg kit #42 and 3 refills.  Printed rx and to fax to [employer]. And pt needs to sign attestation form mailed to her.   

## 2022-05-08 NOTE — Telephone Encounter (Signed)
Fax confirmation received from [employer] [facility].  [phone].

## 2022-05-08 NOTE — Telephone Encounter (Signed)
Pt said have received forms, will complete form and drop off in a next couple of days. If have a question can call back.

## 2022-05-11 ENCOUNTER — Other Ambulatory Visit: Payer: Self-pay | Admitting: Family Medicine

## 2022-05-11 DIAGNOSIS — I10 Essential (primary) hypertension: Secondary | ICD-10-CM

## 2022-05-14 NOTE — Progress Notes (Signed)
Cardiology Office Note:    Date:  05/15/2022   ID:  Cassandra Rios, DOB September 26, 1950, MRN [id number]  PCP:  [name], DO  Cardiologist:  Lesleigh Noe, MD   Referring MD: Zola Button, Grayling Congress, *   Chief Complaint  Patient presents with   Hypertension   Congestive Heart Failure   Coronary Artery Disease    History of Present Illness:    Cassandra Rios is a 71 y.o. 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 UGI bleed 10/17/2021.     Data suggest diastolic dysfunction/heart failure.  She is not very active.  Appropriately, Tereso Newcomer attempted to start SGLT2 therapy.  She took the medication for 3 days and stopped.  She stopped because she developed vertigo which I do not believe had anything to do with SGLT2 therapy.  Says she is currently not having any issues with shortness of breath.  No pounding or racing heart.  Home blood pressure monitoring demonstrates blood pressures less than 140/90.  Recent echo and stress perfusion study were low risk/normal except for the finding of grade 2 diastolic dysfunction.  She denies angina.  Past Medical History:  Diagnosis Date   AAA (abdominal aortic aneurysm) (HCC)    Allergic rhinitis    Chronic hoarseness    Colon polyps    COPD (chronic obstructive pulmonary disease) (HCC)    Dr. Marchelle Gearing   Dyslipidemia    Gastric bleed 10/2021   stomach ulcers   Hypertension    Hypothyroidism    MS (multiple sclerosis) (HCC)    Obesity    Stomach ulcer from aspirin/ibuprofen-like drugs (NSAID's)     Past Surgical History:  Procedure Laterality Date   ABDOMINAL HYSTERECTOMY     BARIATRIC SURGERY  09/07/08   lap band   CARPAL TUNNEL RELEASE     CATARACT  EXTRACTION Right 11/08/2017   CATARACT EXTRACTION Left 11/22/2017   CATARACT EXTRACTION, BILATERAL      Toric lenses for astigmatism   CHOLECYSTECTOMY     CHOLECYSTECTOMY     ESOPHAGOGASTRODUODENOSCOPY (EGD) WITH PROPOFOL N/A 10/17/2021   Procedure: ESOPHAGOGASTRODUODENOSCOPY (EGD) WITH PROPOFOL;  Surgeon: Willis Modena, MD;  Location: [facility] ENDOSCOPY;  Service: Gastroenterology;  Laterality: N/A;   HIATAL HERNIA REPAIR  09/07/08   LEFT HEART CATH AND CORONARY ANGIOGRAPHY N/A 07/09/2019   Procedure: LEFT HEART CATH AND CORONARY ANGIOGRAPHY;  Surgeon: Lyn Records, MD;  Location: [facility] INVASIVE CV LAB;  Service: Cardiovascular;  Laterality: N/A;    Current Medications: Current Meds  Medication Sig   acetaminophen (TYLENOL) 500 MG tablet Take 1,000 mg by mouth every 6 (six) hours as needed for moderate pain or headache.   atorvastatin (LIPITOR) 80 MG tablet Take 1 tablet (80 mg total) by mouth daily.   clobetasol ointment (TEMOVATE) 0.05 % Apply topically 2 (two) times daily.   FLUoxetine (PROZAC) 40 MG capsule Take 1 capsule (40 mg total) by mouth daily.   gabapentin (NEURONTIN) 300 MG capsule Take 600 mg by mouth every evening.    hydrochlorothiazide (HYDRODIURIL) 25 MG tablet TAKE 1 TABLET (25 MG TOTAL) BY MOUTH DAILY.   HYDROcodone-acetaminophen (NORCO/VICODIN) 5-325 MG tablet Take 1 tablet by mouth every 6 (six) hours as needed for moderate pain.   Interferon Beta-1b (  BETASERON/EXTAVIA) 0.3 MG KIT injection Inject 0.25 mg into the skin every other day.   levothyroxine (SYNTHROID) 112 MCG tablet Take 1 tablet (112 mcg total) by mouth daily before breakfast.   losartan (COZAAR) 100 MG tablet Take 1 tablet (100 mg total) by mouth daily.   meclizine (ANTIVERT) 12.5 MG tablet Take 1 tablet (12.5 mg total) by mouth 3 (three) times daily as needed for dizziness (vertigo).   mometasone (NASONEX) 50 MCG/ACT nasal spray Place 2 sprays into the nose daily as needed (allergies).   Polyethyl Glycol-Propyl  Glycol (SYSTANE ULTRA OP) Place 1 drop into the right eye daily as needed (dry eye).    silver sulfADIAZINE (SILVADENE) 1 % cream Apply 1 application. topically daily as needed (irritated areas under abdomen).   triamcinolone ointment (KENALOG) 0.1 % Apply 1 application. topically daily as needed (rash).     Allergies:   Codeine, Hydromorphone hcl, Ibuprofen, Morphine sulfate, and Metronidazole   Social History   Socioeconomic History   Marital status: Married    Spouse name: Casimiro Needle   Number of children: 2   Years of education: 16   Highest education level: Not on file  Occupational History   Occupation: retired  Tobacco Use   Smoking status: Former    Packs/day: 2.00    Years: 40.00    Total pack years: 80.00    Types: Cigarettes    Quit date: 04/05/2007    Years since quitting: 15.1    Passive exposure: Current (Husband)   Smokeless tobacco: Never  Vaping Use   Vaping Use: Not on file  Substance and Sexual Activity   Alcohol use: No   Drug use: No   Sexual activity: Never    Partners: Male  Other Topics Concern   Not on file  Social History Narrative   Patient is right handed,reside in home with husband   Social Determinants of Health   Financial Resource Strain: Not on file  Food Insecurity: No Food Insecurity (02/14/2022)   Hunger Vital Sign    Worried About Running Out of Food in the Last Year: Never true    Ran Out of Food in the Last Year: Never true  Transportation Needs: No Transportation Needs (02/14/2022)   PRAPARE - Administrator, Civil Service (Medical): No    Lack of Transportation (Non-Medical): No  Physical Activity: Inactive (04/19/2018)   Exercise Vital Sign    Days of Exercise per Week: 0 days    Minutes of Exercise per Session: 0 min  Stress: No Stress Concern Present (04/19/2018)   [employer] of Occupational Health - Occupational Stress Questionnaire    Feeling of Stress : Not at all  Social Connections: Not on file      Family History: The patient's family history includes AAA (abdominal aortic aneurysm) in her father and sister; Arthritis in her sister; COPD in her father and sister; Dementia in her sister; Heart disease in her father and mother; Other in her sister; Stroke (age of onset: 52) in her mother. There is no history of Multiple sclerosis.  ROS:   Please see the history of present illness.    She has no real complaints.  Says she does not understand the results of her prior cardiac studies from September.  These were discussed with her. all other systems reviewed and are negative.  EKGs/Labs/Other Studies Reviewed:    The following studies were reviewed today:  2 D Doppler ECHO 10/2021: IMPRESSIONS   1. Left ventricular ejection  fraction, by estimation, is 70 to 75%. The  left ventricle has hyperdynamic function. The left ventricle has no  regional wall motion abnormalities. Left ventricular diastolic parameters  are consistent with Grade II diastolic  dysfunction (pseudonormalization). Elevated left atrial pressure.   2. Right ventricular systolic function is normal. The right ventricular  size is normal.   3. The mitral valve is normal in structure. No evidence of mitral valve  regurgitation. No evidence of mitral stenosis.   4. The aortic valve is normal in structure. Aortic valve regurgitation is  not visualized. No aortic stenosis is present.   5. The inferior vena cava is normal in size with <50% respiratory  variability, suggesting right atrial pressure of 8 mmHg.   MYOCARDIAL PERFUSION IMAGING 10/2021:   The study is normal. The study is low risk.   No ST deviation was noted.   Left ventricular function is normal. Nuclear stress EF: 78 %. The left ventricular ejection fraction is hyperdynamic (>65%). End diastolic cavity size is normal.   Prior study available for comparison from 05/23/2016. No changes compared to prior study.   Low risk stress nuclear study with normal  perfusion and normal left ventricular regional and global systolic function.  EKG:  EKG not performed today.  Prior EKG reveals sinus rhythm with PACs.  This EKG was performed in September.  Recent Labs: 02/14/2022: Magnesium 1.4 02/27/2022: ALT 22; BUN 10; Creatinine, Ser 1.00; Hemoglobin 12.9; Platelets 149.0; Potassium 3.4; Sodium 137; TSH 1.75  Recent Lipid Panel    Component Value Date/Time   CHOL 129 02/27/2022 1213   CHOL 130 08/26/2019 0731   TRIG 133.0 02/27/2022 1213   HDL 40.50 02/27/2022 1213   HDL 47 08/26/2019 0731   CHOLHDL 3 02/27/2022 1213   VLDL 26.6 02/27/2022 1213   LDLCALC 62 02/27/2022 1213   LDLCALC 65 08/26/2019 0731    Physical Exam:    VS:  BP 106/82   Pulse 91   Ht 5\' 6"  (1.676 m)   Wt 199 lb 6.4 oz (90.4 kg)   SpO2 99%   BMI 32.18 kg/m     Wt Readings from Last 3 Encounters:  05/15/22 199 lb 6.4 oz (90.4 kg)  02/27/22 212 lb 3.2 oz (96.3 kg)  02/14/22 211 lb (95.7 kg)     GEN: Compatible with age. No acute distress HEENT: Normal NECK: No JVD. LYMPHATICS: No lymphadenopathy CARDIAC: Scratchy soft left parasternal systolic murmur. IRR no gallop, or edema. VASCULAR:  Normal Pulses. No bruits. RESPIRATORY:  Clear to auscultation without rales, wheezing or rhonchi  ABDOMEN: Soft, non-tender, non-distended, No pulsatile mass, MUSCULOSKELETAL: No deformity  SKIN: Warm and dry NEUROLOGIC:  Alert and oriented x 3 PSYCHIATRIC:  Normal affect   ASSESSMENT:    1. Orthostasis   2. Primary hypertension   3. Abdominal aortic atherosclerosis (HCC)   4. Abdominal aortic aneurysm (AAA) without rupture, unspecified part (HCC)   5. Hyperlipidemia LDL goal <70   6. Chronic heart failure with preserved ejection fraction (HCC)   7. Coronary artery disease of native artery of native heart with stable angina pectoris (HCC)    PLAN:    In order of problems listed above:  Resolved.  Was likely related to dehydration from vomiting in the setting of  vertigo. Blood pressure is well-controlled on HCTZ and losartan.  I reviewed her blood pressure calendar and it was excellent. Secondary prevention reviewed.  She does have nonobstructive CAD. Will be followed as needed. Continue high intensity statin  therapy.  Target LDL less than 70.  Most recent was at target. She is theoretically still a candidate for SGLT2 therapy.  She tried Jardiance for 3 days and stopped.  Currently complaining of dyspnea so would not force the issue. Prevention reviewed.   Overall education and awareness concerning primary/secondary risk prevention was discussed in detail: LDL less than 70, hemoglobin A1c less than 7, blood pressure target less than 130/80 mmHg, >150 minutes of moderate aerobic activity per week, avoidance of smoking, weight control (via diet and exercise), and continued surveillance/management of/for obstructive sleep apnea.    Medication Adjustments/Labs and Tests Ordered: Current medicines are reviewed at length with the patient today.  Concerns regarding medicines are outlined above.  No orders of the defined types were placed in this encounter.  No orders of the defined types were placed in this encounter.   There are no Patient Instructions on file for this visit.   Signed, Lesleigh Noe, MD  05/15/2022 9:32 AM    [facility]

## 2022-05-15 ENCOUNTER — Ambulatory Visit: Payer: PPO | Attending: Interventional Cardiology | Admitting: Interventional Cardiology

## 2022-05-15 ENCOUNTER — Encounter: Payer: Self-pay | Admitting: Interventional Cardiology

## 2022-05-15 VITALS — BP 106/82 | HR 91 | Ht 66.0 in | Wt 199.4 lb

## 2022-05-15 DIAGNOSIS — E785 Hyperlipidemia, unspecified: Secondary | ICD-10-CM

## 2022-05-15 DIAGNOSIS — I951 Orthostatic hypotension: Secondary | ICD-10-CM | POA: Diagnosis not present

## 2022-05-15 DIAGNOSIS — I25118 Atherosclerotic heart disease of native coronary artery with other forms of angina pectoris: Secondary | ICD-10-CM | POA: Diagnosis not present

## 2022-05-15 DIAGNOSIS — I700 Atherosclerosis of aorta: Secondary | ICD-10-CM | POA: Diagnosis not present

## 2022-05-15 DIAGNOSIS — I10 Essential (primary) hypertension: Secondary | ICD-10-CM | POA: Diagnosis not present

## 2022-05-15 DIAGNOSIS — I5032 Chronic diastolic (congestive) heart failure: Secondary | ICD-10-CM | POA: Diagnosis not present

## 2022-05-15 DIAGNOSIS — I714 Abdominal aortic aneurysm, without rupture, unspecified: Secondary | ICD-10-CM | POA: Diagnosis not present

## 2022-05-15 NOTE — Patient Instructions (Signed)
Medication Instructions:  Your physician recommends that you continue on your current medications as directed. Please refer to the Current Medication list given to you today.  *If you need a refill on your cardiac medications before your next appointment, please call your pharmacy*  Follow-Up: At [facility], you and your health needs are our priority.  As part of our continuing mission to provide you with exceptional heart care, we have created designated Provider Care Teams.  These Care Teams include your primary Cardiologist (physician) and Advanced Practice Providers (APPs -  Physician Assistants and Nurse Practitioners) who all work together to provide you with the care you need, when you need it.  Your next appointment:   6 month(s)  The format for your next appointment:   In Person  Provider:   Richardson Dopp, PA-C   Important Information About Sugar

## 2022-05-18 ENCOUNTER — Other Ambulatory Visit: Payer: Self-pay | Admitting: Family Medicine

## 2022-05-18 DIAGNOSIS — Z1231 Encounter for screening mammogram for malignant neoplasm of breast: Secondary | ICD-10-CM

## 2022-05-19 ENCOUNTER — Ambulatory Visit (INDEPENDENT_AMBULATORY_CARE_PROVIDER_SITE_OTHER): Payer: PPO | Admitting: Family Medicine

## 2022-05-19 VITALS — BP 108/80 | HR 60 | Temp 98.1°F | Resp 20 | Ht 66.0 in | Wt 198.2 lb

## 2022-05-19 DIAGNOSIS — E669 Obesity, unspecified: Secondary | ICD-10-CM | POA: Diagnosis not present

## 2022-05-19 DIAGNOSIS — E039 Hypothyroidism, unspecified: Secondary | ICD-10-CM

## 2022-05-19 DIAGNOSIS — I700 Atherosclerosis of aorta: Secondary | ICD-10-CM

## 2022-05-19 DIAGNOSIS — I5032 Chronic diastolic (congestive) heart failure: Secondary | ICD-10-CM

## 2022-05-19 DIAGNOSIS — J439 Emphysema, unspecified: Secondary | ICD-10-CM | POA: Diagnosis not present

## 2022-05-19 DIAGNOSIS — E785 Hyperlipidemia, unspecified: Secondary | ICD-10-CM

## 2022-05-19 DIAGNOSIS — I10 Essential (primary) hypertension: Secondary | ICD-10-CM

## 2022-05-19 DIAGNOSIS — R5383 Other fatigue: Secondary | ICD-10-CM

## 2022-05-19 LAB — CBC WITH DIFFERENTIAL/PLATELET
Basophils Absolute: 0 10*3/uL (ref 0.0–0.1)
Basophils Relative: 0.5 % (ref 0.0–3.0)
Eosinophils Absolute: 0.1 10*3/uL (ref 0.0–0.7)
Eosinophils Relative: 1.3 % (ref 0.0–5.0)
HCT: 40.1 % (ref 36.0–46.0)
Hemoglobin: 13.5 g/dL (ref 12.0–15.0)
Lymphocytes Relative: 18.3 % (ref 12.0–46.0)
Lymphs Abs: 1.5 10*3/uL (ref 0.7–4.0)
MCHC: 33.6 g/dL (ref 30.0–36.0)
MCV: 91.1 fl (ref 78.0–100.0)
Monocytes Absolute: 0.6 10*3/uL (ref 0.1–1.0)
Monocytes Relative: 7.3 % (ref 3.0–12.0)
Neutro Abs: 5.9 10*3/uL (ref 1.4–7.7)
Neutrophils Relative %: 72.6 % (ref 43.0–77.0)
Platelets: 186 10*3/uL (ref 150.0–400.0)
RBC: 4.41 Mil/uL (ref 3.87–5.11)
RDW: 13.4 % (ref 11.5–15.5)
WBC: 8.1 10*3/uL (ref 4.0–10.5)

## 2022-05-19 LAB — LIPID PANEL
Cholesterol: 129 mg/dL (ref 0–200)
HDL: 42.9 mg/dL (ref >39.00)
LDL Cholesterol: 62 mg/dL (ref 0–99)
NonHDL: 86.53
Total CHOL/HDL Ratio: 3
Triglycerides: 122 mg/dL (ref 0.0–149.0)
VLDL: 24.4 mg/dL (ref 0.0–40.0)

## 2022-05-19 LAB — COMPREHENSIVE METABOLIC PANEL
ALT: 20 U/L (ref 0–35)
AST: 40 U/L — ABNORMAL HIGH (ref 0–37)
Albumin: 3.8 g/dL (ref 3.5–5.2)
Alkaline Phosphatase: 83 U/L (ref 39–117)
BUN: 15 mg/dL (ref 6–23)
CO2: 28 mEq/L (ref 19–32)
Calcium: 9.5 mg/dL (ref 8.4–10.5)
Chloride: 99 mEq/L (ref 96–112)
Creatinine, Ser: 1.15 mg/dL (ref 0.40–1.20)
GFR: 47.67 mL/min — ABNORMAL LOW (ref >60.00)
Glucose, Bld: 102 mg/dL — ABNORMAL HIGH (ref 70–99)
Potassium: 3.8 mEq/L (ref 3.5–5.1)
Sodium: 137 mEq/L (ref 135–145)
Total Bilirubin: 0.6 mg/dL (ref 0.2–1.2)
Total Protein: 6.4 g/dL (ref 6.0–8.3)

## 2022-05-19 LAB — VITAMIN D 25 HYDROXY (VIT D DEFICIENCY, FRACTURES): VITD: 8.57 ng/mL — ABNORMAL LOW (ref 30.00–100.00)

## 2022-05-19 LAB — VITAMIN B12: Vitamin B-12: 256 pg/mL (ref 211–911)

## 2022-05-19 LAB — TSH: TSH: 0.14 u[IU]/mL — ABNORMAL LOW (ref 0.35–5.50)

## 2022-05-19 NOTE — Patient Instructions (Signed)
Thyroid-Stimulating Hormone Test Why am I having this test? The thyroid is a gland in the lower front of the neck. It makes hormones that affect many body parts and systems, including the system that affects how quickly the body burns fuel for energy (metabolism). The pituitary gland is located just below the brain, behind the eyes and nasal passages. It helps maintain thyroid hormone levels and thyroid gland function. You may have a thyroid-stimulating hormone (TSH) test if you have possible symptoms of abnormal thyroid hormone levels. This test can help your health care provider: Diagnose a disorder of the thyroid gland or pituitary gland. Manage your condition and treatment if you have an underactive thyroid (hypothyroidism) or an overactive thyroid (hyperthyroidism). Newborn babies may have this test done to screen for hypothyroidism that is present at birth (congenital). What is being tested? This test measures the amount of TSH in your blood. TSH may also be called thyrotropin. When the thyroid does not make enough hormones, the pituitary gland releases TSH into the bloodstream to stimulate the thyroid gland to make more hormones. What kind of sample is taken?     A blood sample is required for this test. It is usually collected by inserting a needle into a blood vessel. For newborns, a small amount of blood may be collected from the umbilical cord, or by using a small needle to prick the baby's heel (heel stick). Tell a health care provider about: All medicines you are taking, including vitamins, herbs, eye drops, creams, and over-the-counter medicines. Any bleeding problems you have. Any surgeries you have had. Any medical conditions you have. Whether you are pregnant or may be pregnant. How are the results reported? Your test results will be reported as a value that indicates how much TSH is in your blood. Your health care provider will compare your results to normal ranges that were  established after testing a large group of people (reference ranges). Reference ranges may vary among labs and hospitals. For this test, common reference ranges are: Adult: 2-10 microunits/mL or 2-10 milliunits/L. Newborn: Heel stick: 3-18 microunits/mL or 3-18 milliunits/L. Umbilical cord: 3-12 microunits/mL or 3-12 milliunits/L. What do the results mean? Results that are within the reference range are considered normal. This means that you have a normal amount of TSH in your blood. Results that are higher than the reference range mean that your TSH levels are too high. This may mean: Your thyroid gland is not making enough thyroid hormones. Your thyroid medicine dosage is too low. You have a tumor on your pituitary gland. This is rare. Results that are lower than the reference range mean that your TSH levels are too low. This may be caused by hyperthyroidism or by a problem with the pituitary gland function. Talk with your health care provider about what your results mean. Questions to ask your health care provider Ask your health care provider, or the department that is doing the test: When will my results be ready? How will I get my results? What are my treatment options? What other tests do I need? What are my next steps? Summary You may have a thyroid-stimulating hormone (TSH) test if you have possible symptoms of abnormal thyroid hormone levels. The thyroid is a gland in the lower front of the neck. It makes hormones that affect many body parts and systems. The pituitary gland is located just below the brain, behind the eyes and nasal passages. It helps maintain thyroid hormone levels and thyroid gland function. This test   measures the amount of TSH in your blood. TSH is made by the pituitary gland. It may also be called thyrotropin. This information is not intended to replace advice given to you by your health care provider. Make sure you discuss any questions you have with your  health care provider. Document Revised: 05/24/2021 Document Reviewed: 05/24/2021 Elsevier Patient Education  2023 Elsevier Inc.  

## 2022-05-20 ENCOUNTER — Encounter: Payer: Self-pay | Admitting: Family Medicine

## 2022-05-20 ENCOUNTER — Other Ambulatory Visit: Payer: Self-pay | Admitting: Family Medicine

## 2022-05-20 DIAGNOSIS — E559 Vitamin D deficiency, unspecified: Secondary | ICD-10-CM

## 2022-05-20 DIAGNOSIS — E039 Hypothyroidism, unspecified: Secondary | ICD-10-CM

## 2022-05-20 NOTE — Assessment & Plan Note (Signed)
On a statin 

## 2022-05-20 NOTE — Assessment & Plan Note (Signed)
Con't diet and exercise

## 2022-05-20 NOTE — Assessment & Plan Note (Signed)
Stable Con't synthroid

## 2022-05-20 NOTE — Assessment & Plan Note (Signed)
Tolerating statin, encouraged heart healthy diet, avoid trans fats, minimize simple carbs and saturated fats. Increase exercise as tolerated 

## 2022-05-20 NOTE — Assessment & Plan Note (Signed)
Per cardio 

## 2022-05-20 NOTE — Assessment & Plan Note (Signed)
Well controlled, no changes to meds. Encouraged heart healthy diet such as the DASH diet and exercise as tolerated.  °

## 2022-05-20 NOTE — Assessment & Plan Note (Signed)
stable °

## 2022-05-20 NOTE — Progress Notes (Signed)
Established Patient Office Visit  Subjective   Patient ID: Cassandra Rios, female    DOB: [birth year]  Age: 71 y.o. MRN: [id number]  Chief Complaint  Patient presents with   Hypertension   Hyperlipidemia   Hypothyroidism   Follow-up    HPI Pt is here to f/u bp ,, chol and dm.   HPI HYPERTENSION  Blood pressure range-not checking   Chest pain- no      Dyspnea- no Lightheadedness- no   Edema- no Other side effects - no   Medication compliance: good Low salt diet- yes  DIABETES  Blood Sugar ranges-not checking   Polyuria- no New Visual problems- no Hypoglycemic symptoms- no Other side effects-no Medication compliance - good  Foot exam- today  HYPERLIPIDEMIA  Medication compliance- good RUQ pain- no  Muscle aches- no Other side effects-no     Patient Active Problem List   Diagnosis Date Noted   Hypokalemia 02/14/2022   Class 1 obesity 02/14/2022   Acute gastroenteritis 02/14/2022   Tremor 01/02/2022   (HFpEF) heart failure with preserved ejection fraction (HCC) 11/24/2021   Gastrointestinal hemorrhage associated with gastric ulcer 10/25/2021   UGI bleed 10/17/2021   Misadventure of surgical procedure 09/28/2021   COVID-19 02/22/2021   COPD (chronic obstructive pulmonary disease) (HCC)    Lumbar spondylosis 11/05/2020   Lumbar pain 09/14/2020   Urinary incontinence, mixed 07/07/2020   Chronic midline low back pain without sciatica 04/20/2020   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/25/2020   Abdominal aortic atherosclerosis (HCC) 03/09/2020   Chronic back pain 03/09/2020   Cellulitis of breast 02/03/2020   Flank pain 02/03/2020   CAD (coronary artery disease), native coronary artery 07/09/2019   DOE (dyspnea on exertion) 04/21/2019   Preventative health care 04/21/2019   AAA (abdominal aortic aneurysm) (HCC) 04/19/2018   Chronic headaches 04/19/2018   Lichen sclerosus et atrophicus 03/31/2018   Sinus headache 03/18/2018   Closed fracture  of right distal fibula 12/29/2017   Ankle fracture 12/29/2017   Osteoarthritis of acromioclavicular joint 11/05/2017   Impingement syndrome of left shoulder region 11/05/2017   Hyperlipidemia LDL goal <70 09/27/2017   Senile purpura (HCC) 03/29/2017   Lichen sclerosus of female genitalia 10/23/2016   Breast pain, left 03/27/2016   Chest pain 03/27/2016   Essential hypertension 09/24/2015   Sinusitis, acute 03/23/2015   Depression 07/06/2014   Thoracic back pain 07/06/2014   UTI (urinary tract infection) 04/28/2014   Cellulitis and abscess of trunk 02/16/2014   Class 2 obesity due to excess calories with body mass index (BMI) of 37.0 to 37.9 in adult 06/30/2013   Breast mass, right 01/10/2013   Acute sinusitis 03/22/2009   MULTIPLE SCLEROSIS, RELAPSING/REMITTING 01/04/2009   THROMBOCYTOPENIA 09/15/2008   SHOULDER PAIN, LEFT 09/15/2008   BARIATRIC SURGERY STATUS 09/15/2008   Hypothyroidism 07/20/2008   HIATAL HERNIA 07/20/2008   COPD with acute bronchitis (HCC) 07/23/2007   ABNORMAL PULMONARY TEST RESULTS 07/23/2007   Pulmonary function studies abnormal 07/23/2007   Allergic rhinitis 06/26/2007   HOARSENESS, CHRONIC 06/26/2007   WHEEZING 06/26/2007   SNORING 06/26/2007   COUGH 06/26/2007   TOBACCO ABUSE-HISTORY OF 06/26/2007   COLONIC POLYPS, HYPERPLASTIC 06/25/2007   Hyperlipidemia 06/25/2007   Depression with anxiety 06/25/2007   GASTRIC ULCER 06/25/2007   COLONIC POLYPS, ADENOMATOUS, HX OF 06/25/2007   Anxiety 06/25/2007   Past Medical History:  Diagnosis Date   AAA (abdominal aortic aneurysm) (HCC)    Allergic rhinitis    Chronic hoarseness  Colon polyps    COPD (chronic obstructive pulmonary disease) (HCC)    Dr. Marchelle Gearing   Dyslipidemia    Gastric bleed 10/2021   stomach ulcers   Hypertension    Hypothyroidism    MS (multiple sclerosis) (HCC)    Obesity    Stomach ulcer from aspirin/ibuprofen-like drugs (NSAID's)    Past Surgical History:  Procedure  Laterality Date   ABDOMINAL HYSTERECTOMY     BARIATRIC SURGERY  09/07/08   lap band   CARPAL TUNNEL RELEASE     CATARACT EXTRACTION Right 11/08/2017   CATARACT EXTRACTION Left 11/22/2017   CATARACT EXTRACTION, BILATERAL      Toric lenses for astigmatism   CHOLECYSTECTOMY     CHOLECYSTECTOMY     ESOPHAGOGASTRODUODENOSCOPY (EGD) WITH PROPOFOL N/A 10/17/2021   Procedure: ESOPHAGOGASTRODUODENOSCOPY (EGD) WITH PROPOFOL;  Surgeon: Willis Modena, MD;  Location[facility] ENDOSCOPY;  Service: Gastroenterology;  Laterality: N/A;   HIATAL HERNIA REPAIR  09/07/08   LEFT HEART CATH AND CORONARY ANGIOGRAPHY N/A 07/09/2019   Procedure: LEFT HEART CATH AND CORONARY ANGIOGRAPHY;  Surgeon: Lyn Records, MD;  Location: [facility] INVASIVE CV LAB;  Service: Cardiovascular;  Laterality: N/A;   Social History   Tobacco Use   Smoking status: Former    Packs/day: 2.00    Years: 40.00    Total pack years: 80.00    Types: Cigarettes    Quit date: 04/05/2007    Years since quitting: 15.1    Passive exposure: Current (Husband)   Smokeless tobacco: Never  Substance Use Topics   Alcohol use: No   Drug use: No   Social History   Socioeconomic History   Marital status: Married    Spouse name: Casimiro Needle   Number of children: 2   Years of education: 16   Highest education level: Not on file  Occupational History   Occupation: retired  Tobacco Use   Smoking status: Former    Packs/day: 2.00    Years: 40.00    Total pack years: 80.00    Types: Cigarettes    Quit date: 04/05/2007    Years since quitting: 15.1    Passive exposure: Current (Husband)   Smokeless tobacco: Never  Vaping Use   Vaping Use: Not on file  Substance and Sexual Activity   Alcohol use: No   Drug use: No   Sexual activity: Never    Partners: Male  Other Topics Concern   Not on file  Social History Narrative   Patient is right handed,reside in home with husband   Social Determinants of Health   Financial Resource Strain: Not on file   Food Insecurity: No Food Insecurity (02/14/2022)   Hunger Vital Sign    Worried About Running Out of Food in the Last Year: Never true    Ran Out of Food in the Last Year: Never true  Transportation Needs: No Transportation Needs (02/14/2022)   PRAPARE - Administrator, Civil Service (Medical): No    Lack of Transportation (Non-Medical): No  Physical Activity: Inactive (04/19/2018)   Exercise Vital Sign    Days of Exercise per Week: 0 days    Minutes of Exercise per Session: 0 min  Stress: No Stress Concern Present (04/19/2018)   [employer] of Occupational Health - Occupational Stress Questionnaire    Feeling of Stress : Not at all  Social Connections: Not on file  Intimate Partner Violence: Not At Risk (02/14/2022)   Humiliation, Afraid, Rape, and Kick questionnaire    Fear  of Current or Ex-Partner: No    Emotionally Abused: No    Physically Abused: No    Sexually Abused: No   Family Status  Relation Name Status   Mother  Deceased at age 20   Father  Deceased at age 33       pneumonia   Sister  Deceased       11-29-17   Neg Hx  (Not Specified)   Family History  Problem Relation Age of Onset   Stroke Mother 40   Heart disease Mother        congenital heart defect?   COPD Father    Heart disease Father    AAA (abdominal aortic aneurysm) Father    COPD Sister    Arthritis Sister    AAA (abdominal aortic aneurysm) Sister    Dementia Sister    Other Sister        brain injury   Multiple sclerosis Neg Hx    Allergies  Allergen Reactions   Codeine Itching and Nausea And Vomiting   Hydromorphone Hcl Nausea And Vomiting   Ibuprofen Other (See Comments)    stomach ulcers   Morphine Sulfate     "makes my blood pressure bottom out"   Metronidazole Hives, Nausea And Vomiting and Rash      Review of Systems  Constitutional:  Negative for fever and malaise/fatigue.  HENT:  Negative for congestion.   Eyes:  Negative for blurred vision.  Respiratory:   Negative for cough and shortness of breath.   Cardiovascular:  Negative for chest pain, palpitations and leg swelling.  Gastrointestinal:  Negative for vomiting.  Musculoskeletal:  Negative for back pain.  Skin:  Negative for rash.  Neurological:  Negative for loss of consciousness and headaches.      Objective:     BP 108/80 (BP Location: Right Arm, Patient Position: Sitting, Cuff Size: Large)   Pulse 60   Temp 98.1 F (36.7 C) (Oral)   Resp 20   Ht 5\' 6"  (1.676 m)   Wt 198 lb 3.2 oz (89.9 kg)   SpO2 98%   BMI 31.99 kg/m  BP Readings from Last 3 Encounters:  05/19/22 108/80  05/15/22 106/82  02/27/22 108/80   Wt Readings from Last 3 Encounters:  05/19/22 198 lb 3.2 oz (89.9 kg)  05/15/22 199 lb 6.4 oz (90.4 kg)  02/27/22 212 lb 3.2 oz (96.3 kg)   SpO2 Readings from Last 3 Encounters:  05/19/22 98%  05/15/22 99%  02/27/22 99%      Physical Exam Vitals and nursing note reviewed.  Constitutional:      Appearance: She is well-developed.  HENT:     Head: Normocephalic and atraumatic.  Eyes:     Conjunctiva/sclera: Conjunctivae normal.  Neck:     Thyroid: No thyromegaly.     Vascular: No carotid bruit or JVD.  Cardiovascular:     Rate and Rhythm: Normal rate and regular rhythm.     Heart sounds: Normal heart sounds. No murmur heard. Pulmonary:     Effort: Pulmonary effort is normal. No respiratory distress.     Breath sounds: Normal breath sounds. No wheezing or rales.  Chest:     Chest wall: No tenderness.  Musculoskeletal:     Cervical back: Normal range of motion and neck supple.  Neurological:     Mental Status: She is alert and oriented to person, place, and time.      Results for orders placed or performed in visit on  05/19/22  CBC with Differential/Platelet  Result Value Ref Range   WBC 8.1 4.0 - 10.5 K/uL   RBC 4.41 3.87 - 5.11 Mil/uL   Hemoglobin 13.5 12.0 - 15.0 g/dL   HCT 16.1 09.6 - 04.5 %   MCV 91.1 78.0 - 100.0 fl   MCHC 33.6 30.0 -  36.0 g/dL   RDW 40.9 81.1 - 91.4 %   Platelets 186.0 150.0 - 400.0 K/uL   Neutrophils Relative % 72.6 43.0 - 77.0 %   Lymphocytes Relative 18.3 12.0 - 46.0 %   Monocytes Relative 7.3 3.0 - 12.0 %   Eosinophils Relative 1.3 0.0 - 5.0 %   Basophils Relative 0.5 0.0 - 3.0 %   Neutro Abs 5.9 1.4 - 7.7 K/uL   Lymphs Abs 1.5 0.7 - 4.0 K/uL   Monocytes Absolute 0.6 0.1 - 1.0 K/uL   Eosinophils Absolute 0.1 0.0 - 0.7 K/uL   Basophils Absolute 0.0 0.0 - 0.1 K/uL  Comprehensive metabolic panel  Result Value Ref Range   Sodium 137 135 - 145 mEq/L   Potassium 3.8 3.5 - 5.1 mEq/L   Chloride 99 96 - 112 mEq/L   CO2 28 19 - 32 mEq/L   Glucose, Bld 102 (H) 70 - 99 mg/dL   BUN 15 6 - 23 mg/dL   Creatinine, Ser 7.82 0.40 - 1.20 mg/dL   Total Bilirubin 0.6 0.2 - 1.2 mg/dL   Alkaline Phosphatase 83 39 - 117 U/L   AST 40 (H) 0 - 37 U/L   ALT 20 0 - 35 U/L   Total Protein 6.4 6.0 - 8.3 g/dL   Albumin 3.8 3.5 - 5.2 g/dL   GFR 95.62 (L) >13.08 mL/min   Calcium 9.5 8.4 - 10.5 mg/dL  Lipid panel  Result Value Ref Range   Cholesterol 129 0 - 200 mg/dL   Triglycerides 657.8 0.0 - 149.0 mg/dL   HDL 46.96 >29.52 mg/dL   VLDL 84.1 0.0 - 32.4 mg/dL   LDL Cholesterol 62 0 - 99 mg/dL   Total CHOL/HDL Ratio 3    NonHDL 86.53   TSH  Result Value Ref Range   TSH 0.14 (L) 0.35 - 5.50 uIU/mL  Vitamin B12  Result Value Ref Range   Vitamin B-12 256 211 - 911 pg/mL  VITAMIN D 25 Hydroxy (Vit-D Deficiency, Fractures)  Result Value Ref Range   VITD 8.57 (L) 30.00 - 100.00 ng/mL    Last CBC Lab Results  Component Value Date   WBC 8.1 05/19/2022   HGB 13.5 05/19/2022   HCT 40.1 05/19/2022   MCV 91.1 05/19/2022   MCH 29.0 02/17/2022   RDW 13.4 05/19/2022   PLT 186.0 05/19/2022   Last metabolic panel Lab Results  Component Value Date   GLUCOSE 102 (H) 05/19/2022   NA 137 05/19/2022   K 3.8 05/19/2022   CL 99 05/19/2022   CO2 28 05/19/2022   BUN 15 05/19/2022   CREATININE 1.15 05/19/2022    GFRNONAA >60 02/17/2022   CALCIUM 9.5 05/19/2022   PHOS 4.2 12/30/2017   PROT 6.4 05/19/2022   ALBUMIN 3.8 05/19/2022   LABGLOB 2.7 09/05/2016   AGRATIO 1.5 09/05/2016   BILITOT 0.6 05/19/2022   ALKPHOS 83 05/19/2022   AST 40 (H) 05/19/2022   ALT 20 05/19/2022   ANIONGAP 5 02/17/2022   Last lipids Lab Results  Component Value Date   CHOL 129 05/19/2022   HDL 42.90 05/19/2022   LDLCALC 62 05/19/2022   TRIG 122.0 05/19/2022  CHOLHDL 3 05/19/2022   Last hemoglobin A1c Lab Results  Component Value Date   HGBA1C 5.9 09/25/2016   Last thyroid functions Lab Results  Component Value Date   TSH 0.14 (L) 05/19/2022   T4TOTAL 10.7 12/23/2018   Last vitamin D Lab Results  Component Value Date   VD25OH 8.57 (L) 05/19/2022   Last vitamin B12 and Folate Lab Results  Component Value Date   VITAMINB12 256 05/19/2022      The ASCVD Risk score (Arnett DK, et al., 2019) failed to calculate for the following reasons:   The valid total cholesterol range is 130 to 320 mg/dL    Assessment & Plan:   Problem List Items Addressed This Visit       Unprioritized   Hypothyroidism   Relevant Orders   CBC with Differential/Platelet (Completed)   Comprehensive metabolic panel (Completed)   Lipid panel (Completed)   TSH (Completed)   Vitamin B12 (Completed)   VITAMIN D 25 Hydroxy (Vit-D Deficiency, Fractures) (Completed)   Hyperlipidemia   Relevant Orders   CBC with Differential/Platelet (Completed)   Comprehensive metabolic panel (Completed)   Lipid panel (Completed)   TSH (Completed)   Vitamin B12 (Completed)   VITAMIN D 25 Hydroxy (Vit-D Deficiency, Fractures) (Completed)   Other Visit Diagnoses     Primary hypertension    -  Primary   Relevant Orders   CBC with Differential/Platelet (Completed)   Comprehensive metabolic panel (Completed)   Lipid panel (Completed)   TSH (Completed)   Vitamin B12 (Completed)   VITAMIN D 25 Hydroxy (Vit-D Deficiency, Fractures)  (Completed)   Other fatigue       Relevant Orders   Vitamin B12 (Completed)   VITAMIN D 25 Hydroxy (Vit-D Deficiency, Fractures) (Completed)       Return in about 6 months (around 11/18/2022), or if symptoms worsen or fail to improve, for hypertension, hyperlipidemia, diabetes II, annual exam, fasting.    [name], DO

## 2022-05-23 ENCOUNTER — Telehealth: Payer: Self-pay | Admitting: Family Medicine

## 2022-05-23 ENCOUNTER — Ambulatory Visit: Payer: PPO

## 2022-05-23 MED ORDER — LEVOTHYROXINE SODIUM 100 MCG PO TABS: 100.0000 ug | ORAL_TABLET | Freq: Every day | ORAL | 1 refills | Status: DC

## 2022-05-23 MED ORDER — VITAMIN D (ERGOCALCIFEROL) 1.25 MG (50000 UNIT) PO CAPS: 50000.0000 [IU] | ORAL_CAPSULE | ORAL | 1 refills | Status: DC

## 2022-05-23 NOTE — Telephone Encounter (Signed)
Patient said that in the message regarding her labs, Dr.Lowne said she was going to decrease her thyroid medication and call in a vitamin d prescription but they have not been called in yet. Please advise

## 2022-05-23 NOTE — Telephone Encounter (Signed)
Rx sent 

## 2022-05-24 ENCOUNTER — Telehealth: Payer: Self-pay | Admitting: Neurology

## 2022-05-24 NOTE — Telephone Encounter (Signed)
Received a notification that the patient is eligible to receive betaseron at no cost from 1/1/202412/31/2024 from the [place] pt assistance [employer] free drug program

## 2022-06-07 ENCOUNTER — Ambulatory Visit (INDEPENDENT_AMBULATORY_CARE_PROVIDER_SITE_OTHER): Payer: PPO | Admitting: Vascular Surgery

## 2022-06-07 ENCOUNTER — Encounter: Payer: Self-pay | Admitting: Vascular Surgery

## 2022-06-07 VITALS — BP 110/71 | HR 80 | Temp 97.9°F | Resp 20 | Ht 66.0 in | Wt 196.3 lb

## 2022-06-07 DIAGNOSIS — I7143 Infrarenal abdominal aortic aneurysm, without rupture: Secondary | ICD-10-CM | POA: Diagnosis not present

## 2022-06-07 NOTE — Progress Notes (Signed)
Patient ID: Cassandra Rios, female   DOB: 1950-08-20, 72 y.o.   MRN: [id number]  Reason for Consult: Follow-up   Referred by Zola Button, Grayling Congress, *  Subjective:     HPI:  Cassandra Rios is a 72 y.o. female has a known history of abdominal aortic aneurysm with a sister that died after aneurysm surgery in the past.  Her father also was followed for abdominal aortic aneurysm.  She recently was evaluated with CT scan in September now follows up for her scheduled follow-up appointment.  States that she does have abdominal pain radiating from the epigastrium laterally on both sides but no new back pain.  She walks with the help of a walker.  She is otherwise in her normal state of health.  Past Medical History:  Diagnosis Date   AAA (abdominal aortic aneurysm) (HCC)    Allergic rhinitis    Chronic hoarseness    Colon polyps    COPD (chronic obstructive pulmonary disease) (HCC)    Dr. Marchelle Gearing   Dyslipidemia    Gastric bleed 10/2021   stomach ulcers   Hypertension    Hypothyroidism    MS (multiple sclerosis) (HCC)    Obesity    Stomach ulcer from aspirin/ibuprofen-like drugs (NSAID's)    Family History  Problem Relation Age of Onset   Stroke Mother 65   Heart disease Mother        congenital heart defect?   COPD Father    Heart disease Father    AAA (abdominal aortic aneurysm) Father    COPD Sister    Arthritis Sister    AAA (abdominal aortic aneurysm) Sister    Dementia Sister    Other Sister        brain injury   Multiple sclerosis Neg Hx    Past Surgical History:  Procedure Laterality Date   ABDOMINAL HYSTERECTOMY     BARIATRIC SURGERY  09/07/08   lap band   CARPAL TUNNEL RELEASE     CATARACT EXTRACTION Right 11/08/2017   CATARACT EXTRACTION Left 11/22/2017   CATARACT EXTRACTION, BILATERAL      Toric lenses for astigmatism   CHOLECYSTECTOMY     CHOLECYSTECTOMY     ESOPHAGOGASTRODUODENOSCOPY (EGD) WITH PROPOFOL N/A 10/17/2021   Procedure:  ESOPHAGOGASTRODUODENOSCOPY (EGD) WITH PROPOFOL;  Surgeon: Willis Modena, MD;  Location: [facility] ENDOSCOPY;  Service: Gastroenterology;  Laterality: N/A;   HIATAL HERNIA REPAIR  09/07/08   LEFT HEART CATH AND CORONARY ANGIOGRAPHY N/A 07/09/2019   Procedure: LEFT HEART CATH AND CORONARY ANGIOGRAPHY;  Surgeon: Lyn Records, MD;  Location: [facility] INVASIVE CV LAB;  Service: Cardiovascular;  Laterality: N/A;    Short Social History:  Social History   Tobacco Use   Smoking status: Former    Packs/day: 2.00    Years: 40.00    Total pack years: 80.00    Types: Cigarettes    Quit date: 04/05/2007    Years since quitting: 15.1    Passive exposure: Current (Husband)   Smokeless tobacco: Never  Substance Use Topics   Alcohol use: No    Allergies  Allergen Reactions   Codeine Itching and Nausea And Vomiting   Hydromorphone Hcl Nausea And Vomiting   Ibuprofen Other (See Comments)    stomach ulcers   Morphine Sulfate     "makes my blood pressure bottom out"   Metronidazole Hives, Nausea And Vomiting and Rash    Current Outpatient Medications  Medication Sig Dispense Refill   acetaminophen (TYLENOL)  500 MG tablet Take 1,000 mg by mouth every 6 (six) hours as needed for moderate pain or headache.     atorvastatin (LIPITOR) 80 MG tablet Take 1 tablet (80 mg total) by mouth daily. 90 tablet 3   clobetasol ointment (TEMOVATE) 0.05 % Apply topically 2 (two) times daily.     FLUoxetine (PROZAC) 40 MG capsule Take 1 capsule (40 mg total) by mouth daily. 90 capsule 3   gabapentin (NEURONTIN) 300 MG capsule Take 600 mg by mouth every evening.      hydrochlorothiazide (HYDRODIURIL) 25 MG tablet TAKE 1 TABLET (25 MG TOTAL) BY MOUTH DAILY. 30 tablet 4   HYDROcodone-acetaminophen (NORCO/VICODIN) 5-325 MG tablet Take 1 tablet by mouth every 6 (six) hours as needed for moderate pain.     Interferon Beta-1b (BETASERON/EXTAVIA) 0.3 MG KIT injection Inject 0.25 mg into the skin every other day. 42 each 3    levothyroxine (SYNTHROID) 100 MCG tablet Take 1 tablet (100 mcg total) by mouth daily. 30 tablet 1   losartan (COZAAR) 100 MG tablet Take 1 tablet (100 mg total) by mouth daily. 90 tablet 1   meclizine (ANTIVERT) 12.5 MG tablet Take 1 tablet (12.5 mg total) by mouth 3 (three) times daily as needed for dizziness (vertigo). 90 tablet 6   mometasone (NASONEX) 50 MCG/ACT nasal spray Place 2 sprays into the nose daily as needed (allergies).     Polyethyl Glycol-Propyl Glycol (SYSTANE ULTRA OP) Place 1 drop into the right eye daily as needed (dry eye).      silver sulfADIAZINE (SILVADENE) 1 % cream Apply 1 application. topically daily as needed (irritated areas under abdomen).     triamcinolone ointment (KENALOG) 0.1 % Apply 1 application. topically daily as needed (rash).     Vitamin D, Ergocalciferol, (DRISDOL) 1.25 MG (50000 UNIT) CAPS capsule Take 1 capsule (50,000 Units total) by mouth every 7 (seven) days. 12 capsule 1   No current facility-administered medications for this visit.    Review of Systems  Constitutional:  Constitutional negative. HENT: HENT negative.  Eyes: Eyes negative.  Cardiovascular: Cardiovascular negative.  GI: Positive for abdominal pain.  Musculoskeletal: Musculoskeletal negative.  Skin: Skin negative.  Neurological: Neurological negative. Hematologic: Hematologic/lymphatic negative.        Objective:  Objective   Vitals:   06/07/22 0931  BP: 110/71  Pulse: 80  Resp: 20  Temp: 97.9 F (36.6 C)  SpO2: 99%  Weight: 196 lb 4.8 oz (89 kg)  Height: 5\' 6"  (1.676 m)   Body mass index is 31.68 kg/m.  Physical Exam Constitutional:      Appearance: She is obese.  HENT:     Head: Normocephalic.     Nose: Nose normal.  Eyes:     Pupils: Pupils are equal, round, and reactive to light.  Cardiovascular:     Rate and Rhythm: Normal rate.     Pulses:          Femoral pulses are 2+ on the right side and 2+ on the left side.      Popliteal pulses are 2+ on  the right side and 2+ on the left side.  Pulmonary:     Effort: Pulmonary effort is normal.  Abdominal:     General: Abdomen is flat. There is no distension.     Palpations: Abdomen is soft.     Tenderness: There is no abdominal tenderness.  Musculoskeletal:        General: Normal range of motion.  Right lower leg: No edema.     Left lower leg: No edema.  Skin:    General: Skin is warm.     Capillary Refill: Capillary refill takes less than 2 seconds.  Neurological:     General: No focal deficit present.     Mental Status: She is alert.  Psychiatric:        Mood and Affect: Mood normal.        Thought Content: Thought content normal.        Judgment: Judgment normal.     Data: CT IMPRESSION: 1. Interval increase in size of infrarenal abdominal aortic aneurysm from previous measurement of 4.2 x 3.9 cm in 2021 to 4.8 x 4.2 cm currently. If the patient has not been referred previously to vascular surgery, recommend non emergent referral for follow-up as maximal aneurysm size is approaching 5 cm. Recommend follow-up CT/MR every 6 months and vascular consultation. This recommendation follows ACR consensus guidelines: White Paper of the ACR Incidental Findings Committee II on Vascular Findings. J Am Coll Radiol 2013; 10:789-794. 2. No acute findings in the abdomen or pelvis. 3. Coronary atherosclerosis again noted. 4. Stable appearance of laparoscopic gastric band.     Assessment/Plan:    72 year old female with 4.8 cm aneurysm by recent CT scan which was 3 months ago.  We will get repeat CT in 3 months and possibly discuss repair if she is greater than 5 cm.  By recent CT possibly would need fenestrated repair given the reverse conical shape of the proximal neck of the aneurysm.  I discussed this with the patient we also discussed the signs and symptoms of rupture for which she would require emergent medical evaluation and she demonstrates good understanding.  Short of this we  will see her in 3 months with repeat CT.     Maeola Harman MD [facility]

## 2022-06-12 ENCOUNTER — Telehealth: Payer: Self-pay | Admitting: Adult Health

## 2022-06-12 ENCOUNTER — Ambulatory Visit: Payer: PPO | Admitting: Adult Health

## 2022-06-12 ENCOUNTER — Encounter: Payer: Self-pay | Admitting: Adult Health

## 2022-06-12 VITALS — BP 129/75 | HR 82 | Ht 66.0 in | Wt 195.2 lb

## 2022-06-12 DIAGNOSIS — G35 Multiple sclerosis: Secondary | ICD-10-CM | POA: Diagnosis not present

## 2022-06-12 DIAGNOSIS — G4489 Other headache syndrome: Secondary | ICD-10-CM | POA: Diagnosis not present

## 2022-06-12 DIAGNOSIS — Z5181 Encounter for therapeutic drug level monitoring: Secondary | ICD-10-CM

## 2022-06-12 NOTE — Telephone Encounter (Signed)
[employer] NPR sent to GI [phone] 

## 2022-06-12 NOTE — Progress Notes (Signed)
PATIENT: Cassandra Rios DOB: Oct 04, 1950  REASON FOR VISIT: follow up HISTORY FROM: patient Primary neurologist: Dr. Anne Hahn  Chief Complaint  Patient presents with   Follow-up    Pt in 8 Pt here for MS f/u Pt states she has fungus in her mouth and causing blisters. Pt states hard to swallow and cough increased.      HISTORY OF PRESENT ILLNESS: Today 06/12/22:  Cassandra Rios is a 72 y.o.female with a history of multiple sclerosis. Returns today for follow-up.  She remains on Betaseron. Has been in the hospital twice over the last year- one was for gastric ulcers and next time she went in for viral gastritis. Overall she feels that she has been relatively stable.  Reports that she is more dizzy currently taking meclizine.  Feels weaker since hospitalization. Reports constipation and uses sennakot. States that she stays well hydrated.  Reports that she may have a secondary cataract in the left- had to cancel her eye appointment d/t being sick. Needs to make another appointment.  Last MRI of the brain was in June 2021.  She recently had blood work in December.  Reports in the last 5 to 6 months she has had more headaches.  She states that they are typically in the left temporal region.  But has been in both temporal regions.  She states that the pain can sometimes be stabbing pain but Tylenol usually relieves it  At the last visit with Dr. Marjory Lies put her on proprnaolol for tremor but it caused bradycardia and PCP took her off.   She does report that she has a fungus in her mouth and is causing her blisters which makes it hard to swallow and her cough has increased. This started within  the last month. No new medication. Saw PCP couple of weeks ago and reports that PCP did not see anything and therefore no medication was started.   Also, has Adominal aortic aneursym- will be repeating MRI d/t enlargement. Possible intervention this year  UPDATE (11/29/21, [facility]): Since last visit,  had GI bleed admission in May 2023. Now having some issues with increased BP, working with PCP. Has had 1 fall since last visit (home).    05/25/21: Cassandra Rios is a 72 year old female with a history of multiple sclerosis.  She returns today for follow-up.  She remains on Betaseron.  Overall she feels that she is doing well.  Denies any new numbness or weakness.  No changes with her gait or balance.  She does have ongoing back pain and uses a walker.  No changes with the bowels or bladder.  No changes with the vision.  Reports that she has received injections for back pain with Dr. Ethelene Hal with a did not give her lasting benefit neck step is ablation of the nerve.  11/27/19: MRI of the brain with and without contrast:  IMPRESSION: This MRI of the brain with and without contrast shows the following: 1.   Multiple T2/FLAIR hyperintense foci in the hemispheres in a pattern and configuration consistent with chronic demyelinating plaque associated with multiple sclerosis.  Some foci could also be due to chronic microvascular ischemic changes.  None of the lesions appear to be acute and they do not enhance.  Compared to the MRI dated 09/21/2017, there is no definite interval change. 2.   The pituitary gland is thinned within a normal sized sella turcica.  This is usually an incidental finding but could also be due to  elevated intracranial pressures. 3.   There are no acute findings and there is a normal enhancement pattern.  11/22/20: Cassandra Rios is a 72 year old female with a history of Multiple sclerosis. She returns today for follow-up.  Overall the patient feels that she is doing well.  She remains on Betaseron.  Denies any new numbness or weakness.  Patient states that she does use a walker but this is due to back pain.  Denies any changes with the bowels or bladder.  She does have some issues with constipation due to her pain medication.  No changes with her vision.  She continues to have low back pain and  will be receiving injections by Dr. Ethelene Hal on Thursday  05/19/20: Cassandra Rios is a 72 year old female with a history of multiple sclerosis.  She returns today for follow-up.  She continues on Betaseron.  Reports that she tolerates the medication well other than some bruising at the injection site.  She denies any new numbness or weakness.  Denies any significant changes with her gait or balance.  States that she may get off balance with certain movements.  Denies any changes with the bowels or bladder.  Returns today for evaluation.  11/17/19: Cassandra Rios is a 72 year old female with a history of multiple sclerosis.  She returns today for follow-up.  She remains on Betaseron.  She denies any changes with her gait or balance.  No changes with her vision.  Denies any changes with the bowels or bladder.  She does report new tingling sensation in the lower extremities bilaterally.  She describes this as a pins and needle sensation.  Reports that the sensation comes and goes.  Seems to be worse when she sits down.  She does take gabapentin at bedtime and finds it helpful.  She denies being diabetic although her recent lab work shows elevated glucose level.  I do not see a recent hemoglobin A1c.  She returns today for an evaluation.  HISTORY 05/14/19:   Cassandra Rios is a 72 year old female with a history of multiple sclerosis.  She returns today for follow-up.  She remains on Betaseron.  She denies any new symptoms.  No new numbness or tingling.  No changes with her gait or balance.  No change with the bowels or bladder.  No changes with her vision.  She states that she has been having shortness of breath.  Her cardiologist has ordered a CT of the heart.  She denies any other symptoms.  She returns today for an evaluation.  REVIEW OF SYSTEMS: Out of a complete 14 system review of symptoms, the patient complains only of the following symptoms, and all other reviewed systems are negative.  See  HPI  ALLERGIES: Allergies  Allergen Reactions   Codeine Itching and Nausea And Vomiting   Hydromorphone Hcl Nausea And Vomiting   Ibuprofen Other (See Comments)    stomach ulcers   Morphine Sulfate     "makes my blood pressure bottom out"   Metronidazole Hives, Nausea And Vomiting and Rash    HOME MEDICATIONS: Outpatient Medications Prior to Visit  Medication Sig Dispense Refill   acetaminophen (TYLENOL) 500 MG tablet Take 1,000 mg by mouth every 6 (six) hours as needed for moderate pain or headache.     atorvastatin (LIPITOR) 80 MG tablet Take 1 tablet (80 mg total) by mouth daily. 90 tablet 3   clobetasol ointment (TEMOVATE) 0.05 % Apply topically 2 (two) times daily.     FLUoxetine (PROZAC) 40  MG capsule Take 1 capsule (40 mg total) by mouth daily. 90 capsule 3   gabapentin (NEURONTIN) 300 MG capsule Take 600 mg by mouth every evening.      hydrochlorothiazide (HYDRODIURIL) 25 MG tablet TAKE 1 TABLET (25 MG TOTAL) BY MOUTH DAILY. 30 tablet 4   HYDROcodone-acetaminophen (NORCO/VICODIN) 5-325 MG tablet Take 1 tablet by mouth every 6 (six) hours as needed for moderate pain.     Interferon Beta-1b (BETASERON/EXTAVIA) 0.3 MG KIT injection Inject 0.25 mg into the skin every other day. 42 each 3   levothyroxine (SYNTHROID) 100 MCG tablet Take 1 tablet (100 mcg total) by mouth daily. 30 tablet 1   losartan (COZAAR) 100 MG tablet Take 1 tablet (100 mg total) by mouth daily. 90 tablet 1   meclizine (ANTIVERT) 12.5 MG tablet Take 1 tablet (12.5 mg total) by mouth 3 (three) times daily as needed for dizziness (vertigo). 90 tablet 6   mometasone (NASONEX) 50 MCG/ACT nasal spray Place 2 sprays into the nose daily as needed (allergies).     Polyethyl Glycol-Propyl Glycol (SYSTANE ULTRA OP) Place 1 drop into the right eye daily as needed (dry eye).      silver sulfADIAZINE (SILVADENE) 1 % cream Apply 1 application. topically daily as needed (irritated areas under abdomen).     triamcinolone  ointment (KENALOG) 0.1 % Apply 1 application. topically daily as needed (rash).     Vitamin D, Ergocalciferol, (DRISDOL) 1.25 MG (50000 UNIT) CAPS capsule Take 1 capsule (50,000 Units total) by mouth every 7 (seven) days. 12 capsule 1   No facility-administered medications prior to visit.    PAST MEDICAL HISTORY: Past Medical History:  Diagnosis Date   AAA (abdominal aortic aneurysm) (HCC)    Allergic rhinitis    Chronic hoarseness    Colon polyps    COPD (chronic obstructive pulmonary disease) (HCC)    Dr. Marchelle Gearing   Dyslipidemia    Gastric bleed 10/2021   stomach ulcers   Gastritis    Hypertension    Hypothyroidism    MS (multiple sclerosis) (HCC)    Obesity    Stomach ulcer from aspirin/ibuprofen-like drugs (NSAID's)     PAST SURGICAL HISTORY: Past Surgical History:  Procedure Laterality Date   ABDOMINAL HYSTERECTOMY     BARIATRIC SURGERY  09/07/08   lap band   CARPAL TUNNEL RELEASE     CATARACT EXTRACTION Right 11/08/2017   CATARACT EXTRACTION Left 11/22/2017   CATARACT EXTRACTION, BILATERAL      Toric lenses for astigmatism   CHOLECYSTECTOMY     CHOLECYSTECTOMY     ESOPHAGOGASTRODUODENOSCOPY (EGD) WITH PROPOFOL N/A 10/17/2021   Procedure: ESOPHAGOGASTRODUODENOSCOPY (EGD) WITH PROPOFOL;  Surgeon: Willis Modena, MD;  Location: [facility] ENDOSCOPY;  Service: Gastroenterology;  Laterality: N/A;   HIATAL HERNIA REPAIR  09/07/08   LEFT HEART CATH AND CORONARY ANGIOGRAPHY N/A 07/09/2019   Procedure: LEFT HEART CATH AND CORONARY ANGIOGRAPHY;  Surgeon: Lyn Records, MD;  Location: [facility] INVASIVE CV LAB;  Service: Cardiovascular;  Laterality: N/A;    FAMILY HISTORY: Family History  Problem Relation Age of Onset   Stroke Mother 66   Heart disease Mother        congenital heart defect?   COPD Father    Heart disease Father    AAA (abdominal aortic aneurysm) Father    COPD Sister    Arthritis Sister    AAA (abdominal aortic aneurysm) Sister    Dementia Sister    Other Sister  brain injury   Multiple sclerosis Neg Hx     SOCIAL HISTORY: Social History   Socioeconomic History   Marital status: Married    Spouse name: Casimiro Needle   Number of children: 2   Years of education: 16   Highest education level: Not on file  Occupational History   Occupation: retired  Tobacco Use   Smoking status: Former    Packs/day: 2.00    Years: 40.00    Total pack years: 80.00    Types: Cigarettes    Quit date: 04/05/2007    Years since quitting: 15.1    Passive exposure: Current (Husband)   Smokeless tobacco: Never  Vaping Use   Vaping Use: Not on file  Substance and Sexual Activity   Alcohol use: No   Drug use: No   Sexual activity: Never    Partners: Male  Other Topics Concern   Not on file  Social History Narrative   Patient is right handed,reside in home with husband   Social Determinants of Health   Financial Resource Strain: Not on file  Food Insecurity: No Food Insecurity (02/14/2022)   Hunger Vital Sign    Worried About Running Out of Food in the Last Year: Never true    Ran Out of Food in the Last Year: Never true  Transportation Needs: No Transportation Needs (02/14/2022)   PRAPARE - Administrator, Civil Service (Medical): No    Lack of Transportation (Non-Medical): No  Physical Activity: Inactive (04/19/2018)   Exercise Vital Sign    Days of Exercise per Week: 0 days    Minutes of Exercise per Session: 0 min  Stress: No Stress Concern Present (04/19/2018)   [employer] of Occupational Health - Occupational Stress Questionnaire    Feeling of Stress : Not at all  Social Connections: Not on file  Intimate Partner Violence: Not At Risk (02/14/2022)   Humiliation, Afraid, Rape, and Kick questionnaire    Fear of Current or Ex-Partner: No    Emotionally Abused: No    Physically Abused: No    Sexually Abused: No      PHYSICAL EXAM  Vitals:   06/12/22 0930  BP: 129/75  Pulse: 82  Weight: 195 lb 3.2 oz (88.5 kg)   Height: 5\' 6"  (1.676 m)   Body mass index is 31.51 kg/m.  Generalized: Well developed, in no acute distress   Neurological examination  Mentation: Alert oriented to time, place, history taking. Follows all commands speech and language fluent Cranial nerve II-XII: Pupils were equal round reactive to light.  Head turning and shoulder shrug  were normal and symmetric. Motor: The motor testing reveals 5 over 5 strength of all 4 extremities. Good symmetric motor tone is noted throughout.  Sensory: Sensory testing is intact to soft touch on all 4 extremities. No evidence of extinction is noted.  Coordination: Cerebellar testing reveals good finger-nose-finger and heel-to-shin bilaterally.  Mild tremor noted in the upper extremities. Gait and station: Patient uses a walker when ambulating.   DIAGNOSTIC DATA (LABS, IMAGING, TESTING) - I reviewed patient records, labs, notes, testing and imaging myself where available.  Lab Results  Component Value Date   WBC 8.1 05/19/2022   HGB 13.5 05/19/2022   HCT 40.1 05/19/2022   MCV 91.1 05/19/2022   PLT 186.0 05/19/2022      Component Value Date/Time   NA 137 05/19/2022 1010   NA 131 (L) 01/11/2022 1008   K 3.8 05/19/2022 1010   CL 99  05/19/2022 1010   CO2 28 05/19/2022 1010   GLUCOSE 102 (H) 05/19/2022 1010   BUN 15 05/19/2022 1010   BUN 12 01/11/2022 1008   CREATININE 1.15 05/19/2022 1010   CREATININE 1.04 05/13/2014 0840   CALCIUM 9.5 05/19/2022 1010   PROT 6.4 05/19/2022 1010   PROT 6.2 08/26/2019 0731   ALBUMIN 3.8 05/19/2022 1010   ALBUMIN 3.9 08/26/2019 0731   AST 40 (H) 05/19/2022 1010   ALT 20 05/19/2022 1010   ALKPHOS 83 05/19/2022 1010   BILITOT 0.6 05/19/2022 1010   BILITOT 0.3 08/26/2019 0731   GFRNONAA >60 02/17/2022 0754   GFRNONAA 57 (L) 05/13/2014 0840   GFRAA 67 07/03/2019 1515   GFRAA 66 05/13/2014 0840   Lab Results  Component Value Date   CHOL 129 05/19/2022   HDL 42.90 05/19/2022   LDLCALC 62 05/19/2022    TRIG 122.0 05/19/2022   CHOLHDL 3 05/19/2022   Lab Results  Component Value Date   HGBA1C 5.9 09/25/2016   Lab Results  Component Value Date   VITAMINB12 256 05/19/2022   Lab Results  Component Value Date   TSH 0.14 (L) 05/19/2022      ASSESSMENT AND PLAN 72 y.o. year old female  has a past medical history of AAA (abdominal aortic aneurysm) (HCC), Allergic rhinitis, Chronic hoarseness, Colon polyps, COPD (chronic obstructive pulmonary disease) (HCC), Dyslipidemia, Gastric bleed (10/2021), Gastritis, Hypertension, Hypothyroidism, MS (multiple sclerosis) (HCC), Obesity, and Stomach ulcer from aspirin/ibuprofen-like drugs (NSAID's). here with:  Multiple sclerosis  Continue Betaseron Reviewed labwork in EP[facility] from 05/19/2022 Repeat MRI of the brain  Follow-up with PCP if you continue to have trouble swallowing.  Monitor headaches ( seems less likely that its temporal arteritis- ongoing for 5-6 months, visual changes on the opposite side most likely d/t cataracts.) FU 6 months are sooner if needed  Advised patient if her symptoms worsen or she develops new symptoms she should let us know follow-up in 6 months or sooner if needed    Butch Penny, MSN, NP-C 06/12/2022, 9:42 AM [facility] [address], [address] [place], [place] [address] [phone]

## 2022-06-14 ENCOUNTER — Other Ambulatory Visit: Payer: Self-pay | Admitting: Family Medicine

## 2022-06-15 ENCOUNTER — Ambulatory Visit (HOSPITAL_COMMUNITY): Payer: PPO

## 2022-06-20 DIAGNOSIS — L900 Lichen sclerosus et atrophicus: Secondary | ICD-10-CM | POA: Diagnosis not present

## 2022-06-20 DIAGNOSIS — L292 Pruritus vulvae: Secondary | ICD-10-CM | POA: Diagnosis not present

## 2022-06-22 ENCOUNTER — Other Ambulatory Visit: Payer: Self-pay | Admitting: Family Medicine

## 2022-06-29 ENCOUNTER — Ambulatory Visit
Admission: RE | Admit: 2022-06-29 | Discharge: 2022-06-29 | Disposition: A | Discharge status: Home / Self Care | Payer: PPO | Source: Ambulatory Visit | Attending: Adult Health | Admitting: Adult Health

## 2022-06-29 DIAGNOSIS — G35 Multiple sclerosis: Secondary | ICD-10-CM | POA: Diagnosis not present

## 2022-06-29 MED ORDER — GADOPICLENOL 0.5 MMOL/ML IV SOLN
10.0000 mL | Freq: Once | INTRAVENOUS | Status: AC | PRN
  Administered 2022-06-29: 10 mL via INTRAVENOUS

## 2022-07-05 ENCOUNTER — Telehealth: Payer: Self-pay | Admitting: Family Medicine

## 2022-07-05 DIAGNOSIS — H524 Presbyopia: Secondary | ICD-10-CM | POA: Diagnosis not present

## 2022-07-05 DIAGNOSIS — H04123 Dry eye syndrome of bilateral lacrimal glands: Secondary | ICD-10-CM | POA: Diagnosis not present

## 2022-07-05 DIAGNOSIS — H35371 Puckering of macula, right eye: Secondary | ICD-10-CM | POA: Diagnosis not present

## 2022-07-05 DIAGNOSIS — H26491 Other secondary cataract, right eye: Secondary | ICD-10-CM | POA: Diagnosis not present

## 2022-07-05 DIAGNOSIS — H3500 Unspecified background retinopathy: Secondary | ICD-10-CM | POA: Diagnosis not present

## 2022-07-05 DIAGNOSIS — H5213 Myopia, bilateral: Secondary | ICD-10-CM | POA: Diagnosis not present

## 2022-07-05 NOTE — Telephone Encounter (Signed)
Sched an appt w Dr. Etter Sjogren in the next week or so. Ty.

## 2022-07-05 NOTE — Telephone Encounter (Signed)
Spoke with Dr. Nani Ravens since Etter Sjogren is out for 2 weeks it would be best for patient to be evaluated. Please schedule patient at her convenience.

## 2022-07-05 NOTE — Telephone Encounter (Signed)
Patient called to say she saw her eye doctor this morning and has a bleed on the retina of her right eye. Dr. Satira Sark, eye doc, is concerned is related to her blood pressure fluctuating.She was advised to let us know. Patient will be out of the house around 2:30 pm so she can take her husband to his appointment. Please advise what she should do next.

## 2022-07-07 NOTE — Telephone Encounter (Signed)
Patient states she was just advising [name] & does not want an appt. Her eye doc just wanted her to let us know.  She will keep evaluating with the eye doctor about this.  She has a eye surg for cataracts at the end of the month.

## 2022-07-13 ENCOUNTER — Ambulatory Visit
Admission: RE | Admit: 2022-07-13 | Discharge: 2022-07-13 | Disposition: A | Discharge status: Home / Self Care | Payer: PPO | Source: Ambulatory Visit | Attending: Family Medicine | Admitting: Family Medicine

## 2022-07-13 DIAGNOSIS — Z1231 Encounter for screening mammogram for malignant neoplasm of breast: Secondary | ICD-10-CM | POA: Diagnosis not present

## 2022-07-20 ENCOUNTER — Other Ambulatory Visit (INDEPENDENT_AMBULATORY_CARE_PROVIDER_SITE_OTHER): Payer: PPO

## 2022-07-20 DIAGNOSIS — E559 Vitamin D deficiency, unspecified: Secondary | ICD-10-CM | POA: Diagnosis not present

## 2022-07-20 DIAGNOSIS — E039 Hypothyroidism, unspecified: Secondary | ICD-10-CM | POA: Diagnosis not present

## 2022-07-20 LAB — VITAMIN D 25 HYDROXY (VIT D DEFICIENCY, FRACTURES): VITD: 25.28 ng/mL — ABNORMAL LOW (ref 30.00–100.00)

## 2022-07-20 LAB — TSH: TSH: 0.17 u[IU]/mL — ABNORMAL LOW (ref 0.35–5.50)

## 2022-07-23 ENCOUNTER — Other Ambulatory Visit: Payer: Self-pay | Admitting: Family Medicine

## 2022-07-26 ENCOUNTER — Other Ambulatory Visit: Payer: Self-pay | Admitting: Family Medicine

## 2022-07-26 DIAGNOSIS — E039 Hypothyroidism, unspecified: Secondary | ICD-10-CM

## 2022-07-27 ENCOUNTER — Encounter: Payer: Self-pay | Admitting: Family Medicine

## 2022-07-27 ENCOUNTER — Other Ambulatory Visit: Payer: Self-pay | Admitting: *Deleted

## 2022-07-27 DIAGNOSIS — E039 Hypothyroidism, unspecified: Secondary | ICD-10-CM

## 2022-07-27 MED ORDER — LEVOTHYROXINE SODIUM 88 MCG PO TABS: 88.0000 ug | ORAL_TABLET | Freq: Every day | ORAL | 2 refills | Status: DC

## 2022-08-01 DIAGNOSIS — H26491 Other secondary cataract, right eye: Secondary | ICD-10-CM | POA: Diagnosis not present

## 2022-08-09 ENCOUNTER — Other Ambulatory Visit: Payer: Self-pay | Admitting: Family Medicine

## 2022-08-22 DIAGNOSIS — L900 Lichen sclerosus et atrophicus: Secondary | ICD-10-CM | POA: Diagnosis not present

## 2022-08-22 DIAGNOSIS — D485 Neoplasm of uncertain behavior of skin: Secondary | ICD-10-CM | POA: Diagnosis not present

## 2022-08-24 ENCOUNTER — Other Ambulatory Visit: Payer: Self-pay

## 2022-08-24 DIAGNOSIS — I7143 Infrarenal abdominal aortic aneurysm, without rupture: Secondary | ICD-10-CM

## 2022-08-29 DIAGNOSIS — H43813 Vitreous degeneration, bilateral: Secondary | ICD-10-CM | POA: Diagnosis not present

## 2022-08-29 DIAGNOSIS — H35362 Drusen (degenerative) of macula, left eye: Secondary | ICD-10-CM | POA: Diagnosis not present

## 2022-08-29 DIAGNOSIS — H35371 Puckering of macula, right eye: Secondary | ICD-10-CM | POA: Diagnosis not present

## 2022-08-29 DIAGNOSIS — H3561 Retinal hemorrhage, right eye: Secondary | ICD-10-CM | POA: Diagnosis not present

## 2022-08-29 DIAGNOSIS — H04123 Dry eye syndrome of bilateral lacrimal glands: Secondary | ICD-10-CM | POA: Diagnosis not present

## 2022-09-08 ENCOUNTER — Ambulatory Visit
Admission: RE | Admit: 2022-09-08 | Discharge: 2022-09-08 | Disposition: A | Discharge status: Home / Self Care | Payer: PPO | Source: Ambulatory Visit | Attending: Vascular Surgery | Admitting: Vascular Surgery

## 2022-09-08 DIAGNOSIS — I701 Atherosclerosis of renal artery: Secondary | ICD-10-CM | POA: Diagnosis not present

## 2022-09-08 DIAGNOSIS — I513 Intracardiac thrombosis, not elsewhere classified: Secondary | ICD-10-CM | POA: Diagnosis not present

## 2022-09-08 DIAGNOSIS — I7143 Infrarenal abdominal aortic aneurysm, without rupture: Secondary | ICD-10-CM

## 2022-09-08 DIAGNOSIS — N281 Cyst of kidney, acquired: Secondary | ICD-10-CM | POA: Diagnosis not present

## 2022-09-08 MED ORDER — IOPAMIDOL (ISOVUE-370) INJECTION 76%
100.0000 mL | Freq: Once | INTRAVENOUS | Status: AC | PRN
  Administered 2022-09-08: 100 mL via INTRAVENOUS

## 2022-09-13 ENCOUNTER — Encounter: Payer: Self-pay | Admitting: Vascular Surgery

## 2022-09-13 ENCOUNTER — Ambulatory Visit (INDEPENDENT_AMBULATORY_CARE_PROVIDER_SITE_OTHER): Payer: PPO | Admitting: Vascular Surgery

## 2022-09-13 VITALS — BP 132/78 | HR 60 | Temp 97.8°F | Resp 20 | Ht 66.0 in | Wt 204.5 lb

## 2022-09-13 DIAGNOSIS — I714 Abdominal aortic aneurysm, without rupture, unspecified: Secondary | ICD-10-CM | POA: Diagnosis not present

## 2022-09-13 NOTE — Progress Notes (Signed)
Patient ID: Cassandra Rios, female   DOB: 1951-03-29, 72 y.o.   MRN: [id number]  Reason for Consult: Follow-up   Referred by Zola Button, Grayling Congress, *  Subjective:     HPI:  Cassandra Rios is a 72 y.o. female history of abdominal aortic aneurysm with a sister that died from aneurysm surgery in the past and also had a father followed with abdominal arctic aneurysm.  She remains active walking without the walker and is recently lost weight in preparation for upcoming surgery.  She does not have any abdominal pain or back pain at this time and remains in her usual state of health.  She does not take any blood thinners.  Previously followed by Dr. Garnette Scheuermann with cardiology.  Past Medical History:  Diagnosis Date   AAA (abdominal aortic aneurysm)    Allergic rhinitis    Chronic hoarseness    Colon polyps    COPD (chronic obstructive pulmonary disease)    Dr. Marchelle Gearing   Dyslipidemia    Gastric bleed 10/2021   stomach ulcers   Gastritis    Hypertension    Hypothyroidism    MS (multiple sclerosis)    Obesity    Stomach ulcer from aspirin/ibuprofen-like drugs (NSAID's)    Family History  Problem Relation Age of Onset   Stroke Mother 28   Heart disease Mother        congenital heart defect?   COPD Father    Heart disease Father    AAA (abdominal aortic aneurysm) Father    COPD Sister    Arthritis Sister    AAA (abdominal aortic aneurysm) Sister    Dementia Sister    Other Sister        brain injury   Multiple sclerosis Neg Hx    Past Surgical History:  Procedure Laterality Date   ABDOMINAL HYSTERECTOMY     BARIATRIC SURGERY  09/07/08   lap band   CARPAL TUNNEL RELEASE     CATARACT EXTRACTION Right 11/08/2017   CATARACT EXTRACTION Left 11/22/2017   CATARACT EXTRACTION, BILATERAL      Toric lenses for astigmatism   CHOLECYSTECTOMY     CHOLECYSTECTOMY     ESOPHAGOGASTRODUODENOSCOPY (EGD) WITH PROPOFOL N/A 10/17/2021   Procedure: ESOPHAGOGASTRODUODENOSCOPY (EGD)  WITH PROPOFOL;  Surgeon: Willis Modena, MD;  Location: [facility] ENDOSCOPY;  Service: Gastroenterology;  Laterality: N/A;   HIATAL HERNIA REPAIR  09/07/08   LEFT HEART CATH AND CORONARY ANGIOGRAPHY N/A 07/09/2019   Procedure: LEFT HEART CATH AND CORONARY ANGIOGRAPHY;  Surgeon: Lyn Records, MD;  Location: [facility] INVASIVE CV LAB;  Service: Cardiovascular;  Laterality: N/A;    Short Social History:  Social History   Tobacco Use   Smoking status: Former    Packs/day: 2.00    Years: 40.00    Additional pack years: 0.00    Total pack years: 80.00    Types: Cigarettes    Quit date: 04/05/2007    Years since quitting: 15.4    Passive exposure: Current (Husband)   Smokeless tobacco: Never  Substance Use Topics   Alcohol use: No    Allergies  Allergen Reactions   Codeine Itching and Nausea And Vomiting   Hydromorphone Hcl Nausea And Vomiting   Ibuprofen Other (See Comments)    stomach ulcers   Morphine Sulfate     "makes my blood pressure bottom out"   Metronidazole Hives, Nausea And Vomiting and Rash    Current Outpatient Medications  Medication Sig Dispense Refill  acetaminophen (TYLENOL) 500 MG tablet Take 1,000 mg by mouth every 6 (six) hours as needed for moderate pain or headache.     atorvastatin (LIPITOR) 80 MG tablet Take 1 tablet (80 mg total) by mouth daily. 90 tablet 3   clobetasol ointment (TEMOVATE) 0.05 % Apply topically 2 (two) times daily.     FLUoxetine (PROZAC) 40 MG capsule Take 1 capsule (40 mg total) by mouth daily. 90 capsule 3   gabapentin (NEURONTIN) 300 MG capsule Take 600 mg by mouth every evening.      hydrochlorothiazide (HYDRODIURIL) 25 MG tablet TAKE 1 TABLET (25 MG TOTAL) BY MOUTH DAILY. 90 tablet 1   HYDROcodone-acetaminophen (NORCO/VICODIN) 5-325 MG tablet Take 1 tablet by mouth every 6 (six) hours as needed for moderate pain.     Interferon Beta-1b (BETASERON/EXTAVIA) 0.3 MG KIT injection Inject 0.25 mg into the skin every other day. 42 each 3    levothyroxine (SYNTHROID) 88 MCG tablet Take 1 tablet (88 mcg total) by mouth daily. 30 tablet 2   losartan (COZAAR) 100 MG tablet TAKE 1 TABLET BY MOUTH EVERY DAY 90 tablet 1   meclizine (ANTIVERT) 12.5 MG tablet Take 1 tablet (12.5 mg total) by mouth 3 (three) times daily as needed for dizziness (vertigo). 90 tablet 6   mometasone (NASONEX) 50 MCG/ACT nasal spray Place 2 sprays into the nose daily as needed (allergies).     Polyethyl Glycol-Propyl Glycol (SYSTANE ULTRA OP) Place 1 drop into the right eye daily as needed (dry eye).      silver sulfADIAZINE (SILVADENE) 1 % cream Apply 1 application. topically daily as needed (irritated areas under abdomen).     triamcinolone ointment (KENALOG) 0.1 % Apply 1 application. topically daily as needed (rash).     Vitamin D, Ergocalciferol, (DRISDOL) 1.25 MG (50000 UNIT) CAPS capsule Take 1 capsule (50,000 Units total) by mouth every 7 (seven) days. 12 capsule 1   No current facility-administered medications for this visit.    Review of Systems  Constitutional:  Constitutional negative. HENT: HENT negative.  Eyes: Eyes negative.  Respiratory: Respiratory negative.  Cardiovascular: Cardiovascular negative.  GI: Gastrointestinal negative.  Musculoskeletal: Musculoskeletal negative.  Skin: Skin negative.  Neurological: Neurological negative. Hematologic: Hematologic/lymphatic negative.  Psychiatric: Psychiatric negative.        Objective:  Objective   Vitals:   09/13/22 0954  BP: 132/78  Pulse: 60  Resp: 20  Temp: 97.8 F (36.6 C)  SpO2: 97%  Weight: 204 lb 8 oz (92.8 kg)  Height: 5\' 6"  (1.676 m)   Body mass index is 33.01 kg/m.  Physical Exam HENT:     Head: Normocephalic.     Nose: Nose normal.  Eyes:     Pupils: Pupils are equal, round, and reactive to light.  Cardiovascular:     Rate and Rhythm: Normal rate.  Pulmonary:     Effort: Pulmonary effort is normal.  Abdominal:     General: Abdomen is flat.   Musculoskeletal:     Cervical back: Normal range of motion and neck supple.     Right lower leg: No edema.     Left lower leg: No edema.  Skin:    General: Skin is warm.     Capillary Refill: Capillary refill takes less than 2 seconds.  Neurological:     General: No focal deficit present.     Mental Status: She is alert.  Psychiatric:        Mood and Affect: Mood normal.  Behavior: Behavior normal.        Thought Content: Thought content normal.        Judgment: Judgment normal.     Data: CT IMPRESSION: VASCULAR   1. Enlarging infrarenal abdominal aortic aneurysm now measuring up to 5 cm in maximal diameter. Recommend follow-up every 6 months. Patient is already under the care of a vascular surgeon. This recommendation follows ACR consensus guidelines: White Paper of the ACR Incidental Findings Committee II on Vascular Findings. J Am Coll Radiol 2013; 10:789-794. 2. 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 3. Aortic and coronary artery atherosclerotic vascular calcifications.     Assessment/Plan:    72 year old female with now greater than 5 cm abdominal aortic aneurysm.  We reviewed the CT scan together and discussed her options being continued watchful waiting versus open or endovascular repair.  She has elected for endovascular repair.  Will get her by cardiology and plan for EVAR in the near future.  All questions were answered today.     Maeola Harman MD [facility]

## 2022-09-14 NOTE — Telephone Encounter (Signed)
Received fax from [employer] pt needing autoinjector and training for betaseron. Fprm completed and back to [employer] [facility] [phone].  Fax confirmation received.

## 2022-09-17 ENCOUNTER — Other Ambulatory Visit: Payer: Self-pay | Admitting: Family Medicine

## 2022-09-17 DIAGNOSIS — E039 Hypothyroidism, unspecified: Secondary | ICD-10-CM

## 2022-09-18 NOTE — Progress Notes (Addendum)
Office Visit    Patient Name: Cassandra Rios Date of Encounter: 09/18/2022  Primary Care Provider:  Zola Button, Grayling Congress, DO Primary Cardiologist:  Lesleigh Noe, MD (Inactive) Primary Electrophysiologist: None  Chief Complaint    Cassandra Rios is a 72 y.o. female with PMH of nonobstructive CAD, HFpEF, RBBB, hypothyroidism HTN, HLD, COPD, AAA, obesity, MS who presents today for preoperative clearance.  Past Medical History    Past Medical History:  Diagnosis Date   AAA (abdominal aortic aneurysm)    Allergic rhinitis    Chronic hoarseness    Colon polyps    COPD (chronic obstructive pulmonary disease)    Dr. Marchelle Gearing   Dyslipidemia    Gastric bleed 10/2021   stomach ulcers   Gastritis    Hypertension    Hypothyroidism    MS (multiple sclerosis)    Obesity    Stomach ulcer from aspirin/ibuprofen-like drugs (NSAID's)    Past Surgical History:  Procedure Laterality Date   ABDOMINAL HYSTERECTOMY     BARIATRIC SURGERY  09/07/08   lap band   CARPAL TUNNEL RELEASE     CATARACT EXTRACTION Right 11/08/2017   CATARACT EXTRACTION Left 11/22/2017   CATARACT EXTRACTION, BILATERAL      Toric lenses for astigmatism   CHOLECYSTECTOMY     CHOLECYSTECTOMY     ESOPHAGOGASTRODUODENOSCOPY (EGD) WITH PROPOFOL N/A 10/17/2021   Procedure: ESOPHAGOGASTRODUODENOSCOPY (EGD) WITH PROPOFOL;  Surgeon: Willis Modena, MD;  Location: [facility] ENDOSCOPY;  Service: Gastroenterology;  Laterality: N/A;   HIATAL HERNIA REPAIR  09/07/08   LEFT HEART CATH AND CORONARY ANGIOGRAPHY N/A 07/09/2019   Procedure: LEFT HEART CATH AND CORONARY ANGIOGRAPHY;  Surgeon: Lyn Records, MD;  Location: [facility] INVASIVE CV LAB;  Service: Cardiovascular;  Laterality: N/A;    Allergies  Allergies  Allergen Reactions   Codeine Itching and Nausea And Vomiting   Hydromorphone Hcl Nausea And Vomiting   Ibuprofen Other (See Comments)    stomach ulcers   Morphine Sulfate     "makes my blood pressure bottom out"    Metronidazole Hives, Nausea And Vomiting and Rash    History of Present Illness    Cassandra Rios  is a 72 year old female with the above mention past medical history who presents today for preoperative clearance.  She was initially seen by Dr. Katrinka Blazing in 2020 for evaluation of shortness of breath.  She had a cardiac CTA completed that showed significant stenosis in proximal left circumflex.  Left heart cath was recommended and patient underwent procedure on 07/09/2019 mid LAD concentric 60 to 70% narrowing and proximal to mid circumflex with 70% bifurcation stenosis.  Medical management was recommended with aggressive preventative therapy.  She was seen in 09/2021 with left parasternal chest discomfort.  She underwent Lexiscan Myoview that was normal and low risk.  She had upper GI bleed 10/17/2021.  She had CT of the abdomen performed that showed infrarenal aortic aneurysm increased in size from previous CTA in 2021.  She was last seen by Dr. Katrinka Blazing 05/15/22 with complaint of orthostasis that was related to dehydration.  She was seen by Dr. Randie Heinz for management of AAA in follow-up on 09/13/2022.  CT scan results were reviewed and options were evaluated in a shared decision and patient elected to undergo endovascular repair of AAA.  [name] presents today for surgical clearance visit alone.  Since last being seen in the office patient reports that she is feeling better than  she has in over a year.  She had adjustments made to her thyroid medication and has not had no exacerbations of her MS recently.  She has lost over 60 pounds with her sickness over the past year. She is compliant with her medications and denies any adverse reactions. Her blood pressure today is well-controlled at 120/76 and heart rate was 69 bpm. Patient denies chest pain, palpitations, dyspnea, PND, orthopnea, nausea, vomiting, dizziness, syncope, edema, weight gain, or early satiety.    Home Medications    Current Outpatient  Medications  Medication Sig Dispense Refill   acetaminophen (TYLENOL) 500 MG tablet Take 1,000 mg by mouth every 6 (six) hours as needed for moderate pain or headache.     atorvastatin (LIPITOR) 80 MG tablet Take 1 tablet (80 mg total) by mouth daily. 90 tablet 3   clobetasol ointment (TEMOVATE) 0.05 % Apply topically 2 (two) times daily.     FLUoxetine (PROZAC) 40 MG capsule Take 1 capsule (40 mg total) by mouth daily. 90 capsule 3   gabapentin (NEURONTIN) 300 MG capsule Take 600 mg by mouth every evening.      hydrochlorothiazide (HYDRODIURIL) 25 MG tablet TAKE 1 TABLET (25 MG TOTAL) BY MOUTH DAILY. 90 tablet 1   HYDROcodone-acetaminophen (NORCO/VICODIN) 5-325 MG tablet Take 1 tablet by mouth every 6 (six) hours as needed for moderate pain.     Interferon Beta-1b (BETASERON/EXTAVIA) 0.3 MG KIT injection Inject 0.25 mg into the skin every other day. 42 each 3   levothyroxine (SYNTHROID) 88 MCG tablet Take 1 tablet (88 mcg total) by mouth daily. 30 tablet 2   losartan (COZAAR) 100 MG tablet TAKE 1 TABLET BY MOUTH EVERY DAY 90 tablet 1   meclizine (ANTIVERT) 12.5 MG tablet Take 1 tablet (12.5 mg total) by mouth 3 (three) times daily as needed for dizziness (vertigo). 90 tablet 6   mometasone (NASONEX) 50 MCG/ACT nasal spray Place 2 sprays into the nose daily as needed (allergies).     Polyethyl Glycol-Propyl Glycol (SYSTANE ULTRA OP) Place 1 drop into the right eye daily as needed (dry eye).      silver sulfADIAZINE (SILVADENE) 1 % cream Apply 1 application. topically daily as needed (irritated areas under abdomen).     triamcinolone ointment (KENALOG) 0.1 % Apply 1 application. topically daily as needed (rash).     Vitamin D, Ergocalciferol, (DRISDOL) 1.25 MG (50000 UNIT) CAPS capsule Take 1 capsule (50,000 Units total) by mouth every 7 (seven) days. 12 capsule 1   No current facility-administered medications for this visit.     Review of Systems  Please see the history of present illness.     (+) Fatigue (+) Dizziness  All other systems reviewed and are otherwise negative except as noted above.  Physical Exam    Wt Readings from Last 3 Encounters:  09/13/22 204 lb 8 oz (92.8 kg)  06/12/22 195 lb 3.2 oz (88.5 kg)  06/07/22 196 lb 4.8 oz (89 kg)   YN:WGNFA were no vitals filed for this visit.,There is no height or weight on file to calculate BMI.  Constitutional:      Appearance: Healthy appearance. Not in distress.  Neck:     Vascular: JVD normal.  Pulmonary:     Effort: Pulmonary effort is normal.     Breath sounds: No wheezing. No rales. Diminished in the bases Cardiovascular:     Normal rate. Regular rhythm. Normal S1. Normal S2.      Murmurs: There is no murmur.  Edema:    Peripheral edema absent.  Abdominal:     Palpations: Abdomen is soft non tender. There is no hepatomegaly.  Skin:    General: Skin is warm and dry.  Neurological:     General: No focal deficit present.     Mental Status: Alert and oriented to person, place and time.     Cranial Nerves: Cranial nerves are intact.  EKG/LABS/ Recent Cardiac Studies    ECG personally reviewed by me today -sinus rhythm with RBBB and rate of 69 bpm with TWI in leads III no acute changes consistent with previous EKG.  Lab Results  Component Value Date   WBC 8.1 05/19/2022   HGB 13.5 05/19/2022   HCT 40.1 05/19/2022   MCV 91.1 05/19/2022   PLT 186.0 05/19/2022   Lab Results  Component Value Date   CREATININE 1.15 05/19/2022   BUN 15 05/19/2022   NA 137 05/19/2022   K 3.8 05/19/2022   CL 99 05/19/2022   CO2 28 05/19/2022   Lab Results  Component Value Date   ALT 20 05/19/2022   AST 40 (H) 05/19/2022   ALKPHOS 83 05/19/2022   BILITOT 0.6 05/19/2022   Lab Results  Component Value Date   CHOL 129 05/19/2022   HDL 42.90 05/19/2022   LDLCALC 62 05/19/2022   TRIG 122.0 05/19/2022   CHOLHDL 3 05/19/2022    Lab Results  Component Value Date   HGBA1C 5.9 09/25/2016    Cardiac Studies &  Procedures   CARDIAC CATHETERIZATION  CARDIAC CATHETERIZATION 07/09/2019  Narrative  Normal left main  Mid LAD concentric 60 to 70% narrowing  Proximal to mid circumflex prior to bifurcation 70%  Mid dominant RCA 40 to 45%  Normal left ventricular systolic function with EF 60%.  RECOMMENDATIONS:   Aggressive preventive therapy.  Increase intensity of statin therapy.  PCI at some point in the future if symptoms warrant.  Findings Coronary Findings Diagnostic  Dominance: Right  Left Anterior Descending Mid LAD lesion is 70% stenosed.  Left Circumflex Prox Cx to Mid Cx lesion is 70% stenosed.  First Obtuse Marginal Branch Vessel is small in size.  Second Obtuse Marginal Branch Vessel is large in size.  Right Coronary Artery Prox RCA to Mid RCA lesion is 45% stenosed.  Intervention  No interventions have been documented.   STRESS TESTS  MYOCARDIAL PERFUSION IMAGING 10/04/2021  Narrative   The study is normal. The study is low risk.   No ST deviation was noted.   Left ventricular function is normal. Nuclear stress EF: 78 %. The left ventricular ejection fraction is hyperdynamic (>65%). End diastolic cavity size is normal.   Prior study available for comparison from 05/23/2016. No changes compared to prior study.  Low risk stress nuclear study with normal perfusion and normal left ventricular regional and global systolic function.   ECHOCARDIOGRAM  ECHOCARDIOGRAM COMPLETE 10/18/2021  Narrative ECHOCARDIOGRAM REPORT    Patient Name:   TYJANAE CARRERAS Date of Exam: 10/18/2021 Medical Rec #:  [id number]          Height:       66.0 in Accession #:    [id number]         Weight:       235.0 lb Date of Birth:  [birth year]          BSA:          2.142 m Patient Age:    63 years  BP:           117/55 mmHg Patient Gender: F                  HR:           61 bpm. Exam Location:  Inpatient  Procedure: 2D Echo, Cardiac Doppler and Color  Doppler  Indications:    R55 Syncope  History:        Patient has no prior history of Echocardiogram examinations. COPD; Risk Factors:Hypertension and Dyslipidemia.  Sonographer:    Eulah Pont RDCS Referring Phys: Sharla Kidney [facility]  IMPRESSIONS   1. Left ventricular ejection fraction, by estimation, is 70 to 75%. The left ventricle has hyperdynamic function. The left ventricle has no regional wall motion abnormalities. Left ventricular diastolic parameters are consistent with Grade II diastolic dysfunction (pseudonormalization). Elevated left atrial pressure. 2. Right ventricular systolic function is normal. The right ventricular size is normal. 3. The mitral valve is normal in structure. No evidence of mitral valve regurgitation. No evidence of mitral stenosis. 4. The aortic valve is normal in structure. Aortic valve regurgitation is not visualized. No aortic stenosis is present. 5. The inferior vena cava is normal in size with <50% respiratory variability, suggesting right atrial pressure of 8 mmHg.  FINDINGS Left Ventricle: Left ventricular ejection fraction, by estimation, is 70 to 75%. The left ventricle has hyperdynamic function. The left ventricle has no regional wall motion abnormalities. The left ventricular internal cavity size was normal in size. There is no left ventricular hypertrophy. Left ventricular diastolic parameters are consistent with Grade II diastolic dysfunction (pseudonormalization). Elevated left atrial pressure.  Right Ventricle: The right ventricular size is normal. No increase in right ventricular wall thickness. Right ventricular systolic function is normal.  Left Atrium: Left atrial size was normal in size.  Right Atrium: Right atrial size was normal in size.  Pericardium: There is no evidence of pericardial effusion.  Mitral Valve: The mitral valve is normal in structure. No evidence of mitral valve regurgitation. No evidence of mitral valve  stenosis.  Tricuspid Valve: The tricuspid valve is normal in structure. Tricuspid valve regurgitation is not demonstrated. No evidence of tricuspid stenosis.  Aortic Valve: The aortic valve is normal in structure. Aortic valve regurgitation is not visualized. No aortic stenosis is present.  Pulmonic Valve: The pulmonic valve was normal in structure. Pulmonic valve regurgitation is not visualized. No evidence of pulmonic stenosis.  Aorta: The aortic root is normal in size and structure.  Venous: The inferior vena cava is normal in size with less than 50% respiratory variability, suggesting right atrial pressure of 8 mmHg.  IAS/Shunts: No atrial level shunt detected by color flow Doppler.   LEFT VENTRICLE PLAX 2D LVIDd:         3.60 cm     Diastology LVIDs:         2.60 cm     LV e' medial:    5.85 cm/s LV PW:         1.00 cm     LV E/e' medial:  13.3 LV IVS:        0.80 cm     LV e' lateral:   7.05 cm/s LVOT diam:     2.00 cm     LV E/e' lateral: 11.0 LV SV:         89 LV SV Index:   42 LVOT Area:     3.14 cm  LV Volumes (MOD) LV vol d, MOD  A2C: 66.2 ml LV vol d, MOD A4C: 87.2 ml LV vol s, MOD A2C: 24.7 ml LV vol s, MOD A4C: 28.8 ml LV SV MOD A2C:     41.5 ml LV SV MOD A4C:     87.2 ml LV SV MOD BP:      49.6 ml  RIGHT VENTRICLE RV S prime:     10.40 cm/s TAPSE (M-mode): 2.1 cm  LEFT ATRIUM             Index        RIGHT ATRIUM           Index LA diam:        3.60 cm 1.68 cm/m   RA Area:     13.30 cm LA Vol (A2C):   51.0 ml 23.81 ml/m  RA Volume:   29.80 ml  13.91 ml/m LA Vol (A4C):   46.3 ml 21.62 ml/m LA Biplane Vol: 52.8 ml 24.65 ml/m AORTIC VALVE LVOT Vmax:   108.00 cm/s LVOT Vmean:  73.700 cm/s LVOT VTI:    0.284 m  AORTA Ao Root diam: 3.10 cm Ao Asc diam:  3.40 cm  MITRAL VALVE MV Area (PHT): 4.49 cm    SHUNTS MV Decel Time: 169 msec    Systemic VTI:  0.28 m MV E velocity: 77.60 cm/s  Systemic Diam: 2.00 cm MV A velocity: 58.30 cm/s MV E/A ratio:   1.33  Mihai Croitoru MD Electronically signed by Thurmon Fair MD Signature Date/Time: 10/18/2021/2:48:57 PM    Final     CT SCANS  CT CORONARY MORPH W/CTA COR W/SCORE 06/30/2019  Addendum 06/30/2019 10:00 AM ADDENDUM REPORT: 06/30/2019 09:58  : CT FFR ANALYSIS  CLINICAL DATA:  47F with AAA, RBBB, chest discomfort and abnormal coronary CT-A.  FINDINGS: FFRct analysis was performed on the original cardiac CT angiogram dataset. Diagrammatic representation of the FFRct analysis is provided in a separate PDF document in PACS. This dictation was created using the PDF document and an interactive 3D model of the results. 3D model is not available in the EMR/PACS. Normal FFR range is >0.80.  1. Left Main:  No significant stenosis.  FFRct 0.98  2. LAD: Possible significant stenosis. FFRct 0.98 proximal, 0.95 mid, 0.78 distal 3. LCX: Significant stenosis.  FFRct 0.93 proximal, 0.59 mid 4. RCA: No significant stenosis. FFRct 0.98 proximal, 0.90 mid, 0.85 distal.  IMPRESSION: 1. CT FFR analysis showed significant stenosis in the proximal LCX and possibly significant stenosis in the mid to distal LAD.  2.  Coronary calcium score 1106, 98th percentile for age and gender.  3.  Recommend cardiac catheterization for further evaluation.  Sharyl Nimrod, MD  06/30/2019  09:57   Electronically Signed By: Chilton Si On: 06/30/2019 09:58  Addendum 06/28/2019  2:55 PM ADDENDUM REPORT: 06/28/2019 14:53  CLINICAL DATA:  47F with AAA, RBBB, chest discomfort and coronary calcification.  EXAM: Cardiac/Coronary  CT  TECHNIQUE: The patient was scanned on a [employer].  FINDINGS: A 120 kV prospective scan was triggered in the descending thoracic aorta at 111 HU's. Axial non-contrast 3 mm slices were carried out through the heart. The data set was analyzed on a dedicated work station and scored using the Agatson method. Gantry rotation speed was 250  msecs and collimation was .6 mm. No beta blockade and 0.8 mg of sl NTG was given. The 3D data set was reconstructed in 5% intervals of the 67-82 % of the R-R cycle. Diastolic phases were analyzed on  a dedicated work station using MPR, MIP and VRT modes. The patient received 80 cc of contrast.  Aorta: Normal size. Ascending aorta 2.8 cm. Mild calcification of the aortic root. Moderate atherosclerosis of the descending aorta. No dissection.  Aortic Valve:  Trileaflet.  No calcifications.  Coronary Arteries:  Normal coronary origin.  Right dominance.  RCA is a large dominant artery that gives rise to PDA and PLVB. Diffuse calcification with minimal(<25%) obstruction proximally and moderate (50-69%) mixed plaque in the mid RCA. Minimal distal RCA plaque.  Left main is a large artery that gives rise to LAD and LCX arteries. There is minimal (<25%) calcified plaque in the distal LM.  LAD is a large vessel that is heavily calcified. There is mild (25-49%) calcified plaque proximally. The mid LAD is heavily calcified with moderate (50-69%) calcified plaque followed by moderate mixed plaque. There is mild (25-49%) mixed plaque distally. Small D1 and D2 that are heavily calcified. Moderate calcified plaque.  LCX is a non-dominant artery. There is minimal (<25%) calcified plaque in the ostium and severe (>70%) mixed plaque just proximal to OM1. Minimal calcified plaque in the mid LCX. Small OM1 and OM2 without significant disease.  Other findings:  Normal pulmonary vein drainage into the left atrium.  Normal let atrial appendage without a thrombus.  Normal size of the pulmonary artery.  IMPRESSION: 1. Coronary calcium score of 1106. This was 98th percentile for age and sex matched control.  2. Normal coronary origin with right dominance.  3.  Heavily calcified coronary arteries.  4. There appears to be obstructive disease in the proximal LCX and possibly in D2. D2 is a small  vessel best treated medically.  5.  Will send study for FFRct.  6.  Moderate atherosclerosis of the descending aorta.  Chilton Si, MD   Electronically Signed By: Chilton Si On: 06/28/2019 14:53  Narrative EXAM: OVER-READ INTERPRETATION  CT CHEST  The following report is an over-read performed by radiologist Dr. Trudie Reed of [facility] on 06/27/2019. This over-read does not include interpretation of cardiac or coronary anatomy or pathology. The coronary calcium score/coronary CTA interpretation by the cardiologist is attached.  COMPARISON:  Chest CT 05/13/2018.  FINDINGS: Aortic atherosclerosis. 3 mm nodule in the right middle lobe (axial image 9 of series 12), stable compared to 2019, considered definitively benign. Within the visualized portions of the thorax there are no other larger more suspicious appearing pulmonary nodules or masses, there is no acute consolidative airspace disease, no pleural effusions, no pneumothorax and no lymphadenopathy. Visualized portions of the upper abdomen are unremarkable. There are no aggressive appearing lytic or blastic lesions noted in the visualized portions of the skeleton. LapBand noted in the upper abdomen.  IMPRESSION: 1.  Aortic Atherosclerosis (ICD10-I70.0).  Electronically Signed: By: Trudie Reed M.D. On: 06/27/2019 08:18          Assessment & Plan    1.  Preoperative clearance: -Patient was unable to complete 4 METS of activity due to current health condition.  She underwent a Lexiscan Myoview that was low risk and showed no evidence of ischemia.  She would therefore be at acceptable risk to proceed with her scheduled procedure with no further cardiovascular testing.  2.  Nonobstructive CAD: -s/p LHC in 07/09/2019 showing nonobstructive CAD and most recent ischemic evaluation completed by [facility] that was normal. -Today patient reports no chest pain or anginal equivalent  since previous visit. -Patient will have Lexiscan stress test completed prior to  clearance being granted for upcoming procedure.  3.  AAA: -Most recent CT scan revealed diameter greater than 5 cm and shared decision made with election for endovascular repair. -Continue BP medications and Lipitor 80 mg daily  4.  Essential hypertension: -Patient's last blood pressure was well-controlled at 120/76 -Continue HCTZ 25 mg and losartan 100 mg daily  5.  Hyperlipidemia: -Patient's last LDL cholesterol was 62 -Continue Lipitor 80 mg daily  Disposition: Follow-up with Lesleigh Noe, MD (Inactive) or APP in 6 months Shared Decision Making/Informed Consent The risks [chest pain, shortness of breath, cardiac arrhythmias, dizziness, blood pressure fluctuations, myocardial infarction, stroke/transient ischemic attack, nausea, vomiting, allergic reaction, radiation exposure, metallic taste sensation and life-threatening complications (estimated to be 1 in 10,000)], benefits (risk stratification, diagnosing coronary artery disease, treatment guidance) and alternatives of a nuclear stress test were discussed in detail with Ms. Twitty and she agrees to proceed.   Medication Adjustments/Labs and Tests Ordered: Current medicines are reviewed at length with the patient today.  Concerns regarding medicines are outlined above.   Signed, Napoleon Form, Leodis Rains, NP 09/18/2022, 8:29 AM [facility]  Note:  This document was prepared using Dragon voice recognition software and may include unintentional dictation errors.

## 2022-09-19 ENCOUNTER — Ambulatory Visit: Payer: PPO | Attending: Physician Assistant | Admitting: Nurse Practitioner

## 2022-09-19 ENCOUNTER — Encounter: Payer: Self-pay | Admitting: Nurse Practitioner

## 2022-09-19 VITALS — BP 120/76 | HR 69 | Ht 66.0 in | Wt 205.8 lb

## 2022-09-19 DIAGNOSIS — I714 Abdominal aortic aneurysm, without rupture, unspecified: Secondary | ICD-10-CM

## 2022-09-19 DIAGNOSIS — E785 Hyperlipidemia, unspecified: Secondary | ICD-10-CM | POA: Diagnosis not present

## 2022-09-19 DIAGNOSIS — I10 Essential (primary) hypertension: Secondary | ICD-10-CM | POA: Diagnosis not present

## 2022-09-19 DIAGNOSIS — I2510 Atherosclerotic heart disease of native coronary artery without angina pectoris: Secondary | ICD-10-CM

## 2022-09-19 DIAGNOSIS — Z01810 Encounter for preprocedural cardiovascular examination: Secondary | ICD-10-CM

## 2022-09-19 NOTE — Patient Instructions (Signed)
Medication Instructions:  Your physician recommends that you continue on your current medications as directed. Please refer to the Current Medication list given to you today.  *If you need a refill on your cardiac medications before your next appointment, please call your pharmacy*   Lab Work: None Ordered If you have labs (blood work) drawn today and your tests are completely normal, you will receive your results only by: MyChart Message (if you have MyChart) OR A paper copy in the mail If you have any lab test that is abnormal or we need to change your treatment, we will call you to review the results.   Testing/Procedures: Your physician has requested that you have a lexiscan myoview. For further information please visit [email or web address] Please follow instruction sheet, as given.   Follow-Up: At [facility], you and your health needs are our priority.  As part of our continuing mission to provide you with exceptional heart care, we have created designated Provider Care Teams.  These Care Teams include your primary Cardiologist (physician) and Advanced Practice Providers (APPs -  Physician Assistants and Nurse Practitioners) who all work together to provide you with the care you need, when you need it.  We recommend signing up for the patient portal called "MyChart".  Sign up information is provided on this After Visit Summary.  MyChart is used to connect with patients for Virtual Visits (Telemedicine).  Patients are able to view lab/test results, encounter notes, upcoming appointments, etc.  Non-urgent messages can be sent to your provider as well.   To learn more about what you can do with MyChart, go to [email or web address].    Your next appointment:   6 month(s)  Provider:   Jake Bathe, MD  Other Instructions

## 2022-09-20 ENCOUNTER — Telehealth (HOSPITAL_COMMUNITY): Payer: Self-pay | Admitting: *Deleted

## 2022-09-20 NOTE — Telephone Encounter (Signed)
Per DPR left detailed instructions for MPI study scheduled on 09/28/22.

## 2022-09-20 NOTE — Addendum Note (Signed)
Addended byDurenda Hurt on: 09/20/2022 09:57 AM   Modules accepted: Orders

## 2022-09-21 ENCOUNTER — Encounter: Payer: Self-pay | Admitting: Adult Health

## 2022-09-25 ENCOUNTER — Other Ambulatory Visit (INDEPENDENT_AMBULATORY_CARE_PROVIDER_SITE_OTHER): Payer: PPO

## 2022-09-25 DIAGNOSIS — Z79891 Long term (current) use of opiate analgesic: Secondary | ICD-10-CM | POA: Diagnosis not present

## 2022-09-25 DIAGNOSIS — M549 Dorsalgia, unspecified: Secondary | ICD-10-CM | POA: Diagnosis not present

## 2022-09-25 DIAGNOSIS — Z5181 Encounter for therapeutic drug level monitoring: Secondary | ICD-10-CM | POA: Diagnosis not present

## 2022-09-25 DIAGNOSIS — Z79899 Other long term (current) drug therapy: Secondary | ICD-10-CM | POA: Diagnosis not present

## 2022-09-25 DIAGNOSIS — E039 Hypothyroidism, unspecified: Secondary | ICD-10-CM | POA: Diagnosis not present

## 2022-09-25 LAB — TSH: TSH: 3.02 u[IU]/mL (ref 0.35–5.50)

## 2022-09-27 ENCOUNTER — Encounter: Payer: Self-pay | Admitting: Family Medicine

## 2022-09-27 MED ORDER — VITAMIN D (ERGOCALCIFEROL) 1.25 MG (50000 UNIT) PO CAPS: 50000.0000 [IU] | ORAL_CAPSULE | ORAL | 1 refills | Status: DC

## 2022-09-28 ENCOUNTER — Ambulatory Visit (HOSPITAL_COMMUNITY): Payer: PPO | Attending: Internal Medicine

## 2022-09-28 DIAGNOSIS — Z01810 Encounter for preprocedural cardiovascular examination: Secondary | ICD-10-CM | POA: Diagnosis not present

## 2022-09-28 DIAGNOSIS — I2510 Atherosclerotic heart disease of native coronary artery without angina pectoris: Secondary | ICD-10-CM | POA: Diagnosis not present

## 2022-09-28 DIAGNOSIS — E785 Hyperlipidemia, unspecified: Secondary | ICD-10-CM | POA: Insufficient documentation

## 2022-09-28 DIAGNOSIS — I10 Essential (primary) hypertension: Secondary | ICD-10-CM | POA: Diagnosis not present

## 2022-09-28 DIAGNOSIS — I714 Abdominal aortic aneurysm, without rupture, unspecified: Secondary | ICD-10-CM | POA: Diagnosis not present

## 2022-09-28 LAB — MYOCARDIAL PERFUSION IMAGING
LV dias vol: 45 mL (ref 46–106)
LV sys vol: 13 mL
Nuc Stress EF: 71 %
Peak HR: 65 {beats}/min
Rest HR: 56 {beats}/min
Rest Nuclear Isotope Dose: 10.8 mCi
SDS: 2
SRS: 0
SSS: 2
ST Depression (mm): 0 mm
Stress Nuclear Isotope Dose: 30.7 mCi
TID: 0.94

## 2022-09-28 MED ORDER — REGADENOSON 0.4 MG/5ML IV SOLN
0.4000 mg | Freq: Once | INTRAVENOUS | Status: AC
Start: 2022-09-28 — End: 2022-09-28
  Administered 2022-09-28: 0.4 mg via INTRAVENOUS

## 2022-09-28 MED ORDER — TECHNETIUM TC 99M TETROFOSMIN IV KIT
30.7000 | PACK | Freq: Once | INTRAVENOUS | Status: AC | PRN
  Administered 2022-09-28: 30.7 via INTRAVENOUS

## 2022-09-28 MED ORDER — TECHNETIUM TC 99M TETROFOSMIN IV KIT
10.8000 | PACK | Freq: Once | INTRAVENOUS | Status: AC | PRN
  Administered 2022-09-28: 10.8 via INTRAVENOUS

## 2022-10-06 DIAGNOSIS — L02211 Cutaneous abscess of abdominal wall: Secondary | ICD-10-CM | POA: Diagnosis not present

## 2022-10-09 ENCOUNTER — Other Ambulatory Visit: Payer: Self-pay

## 2022-10-09 ENCOUNTER — Telehealth: Payer: Self-pay

## 2022-10-09 DIAGNOSIS — I714 Abdominal aortic aneurysm, without rupture, unspecified: Secondary | ICD-10-CM

## 2022-10-09 NOTE — Telephone Encounter (Signed)
Pt's EVAR surgery has been scheduled for later this month. She has what she called an abscess on her abdomen and is on day 3 of antibiotics. She said she is f/u with her pcp later this week regarding it. She will call us if this does not resolve prior to surgery.

## 2022-10-20 ENCOUNTER — Other Ambulatory Visit: Payer: Self-pay

## 2022-10-20 MED ORDER — ATORVASTATIN CALCIUM 80 MG PO TABS: 80.0000 mg | ORAL_TABLET | Freq: Every day | ORAL | 3 refills | Status: DC

## 2022-10-20 NOTE — Progress Notes (Signed)
Ok to continue taking interferon B injection through the [employer] per [employer], PA-C.

## 2022-10-20 NOTE — Progress Notes (Signed)
Surgical Instructions    Your procedure is scheduled on Tuesday, 10/31/22.  Report to [facility] Main Entrance "A" at 7:30 A.M., then check in with the Admitting office.  Call this number if you have problems the morning of surgery:  [phone]   If you have any questions prior to your surgery date call [phone]: Open Monday-Friday 8am-4pm If you experience any cold or flu symptoms such as cough, fever, chills, shortness of breath, etc. between now and your scheduled surgery, please notify us at the above number     Remember:  Do not eat or drink after midnight the night before your surgery    Take these medicines the morning of surgery with A SIP OF WATER:  atorvastatin (LIPITOR)  FLUoxetine (PROZAC)  levothyroxine (SYNTHROID)  pantoprazole (PROTONIX)   IF NEEDED: acetaminophen (TYLENOL)  HYDROcodone-acetaminophen (NORCO/VICODIN)  meclizine (ANTIVERT)  mometasone (NASONEX) nasal spray traMADol (ULTRAM)   As of today, STOP taking any Aspirin (unless otherwise instructed by your surgeon) Aleve, Naproxen, Ibuprofen, Motrin, Advil, Goody's, BC's, all herbal medications, fish oil, and all vitamins.            Do not wear jewelry or makeup. Do not wear lotions, powders, perfumes or deodorant. Do not shave 48 hours prior to surgery.   Do not bring valuables to the hospital. Do not wear nail polish, gel polish, artificial nails, or any other type of covering on natural nails (fingers and toes) If you have artificial nails or gel coating that need to be removed by a nail salon, please have this removed prior to surgery. Artificial nails or gel coating may interfere with anesthesia's ability to adequately monitor your vital signs.  [facility] is not responsible for any belongings or valuables.    Do NOT Smoke (Tobacco/Vaping)  24 hours prior to your procedure  If you use a CPAP at night, you may bring your mask for your overnight stay.   Contacts, glasses, hearing aids,  dentures or partials may not be worn into surgery, please bring cases for these belongings   For patients admitted to the hospital, discharge time will be determined by your treatment team.   Patients discharged the day of surgery will not be allowed to drive home, and someone needs to stay with them for 24 hours.   SURGICAL WAITING ROOM VISITATION Patients having surgery or a procedure may have no more than 2 support people in the waiting area - these visitors may rotate.   Children under the age of 68 must have an adult with them who is not the patient. If the patient needs to stay at the hospital during part of their recovery, the visitor guidelines for inpatient rooms apply. Pre-op nurse will coordinate an appropriate time for 1 support person to accompany patient in pre-op.  This support person may not rotate.   Please refer to [email or web address] for the visitor guidelines for Inpatients (after your surgery is over and you are in a regular room).    Special instructions:    Oral Hygiene is also important to reduce your risk of infection.  Remember - BRUSH YOUR TEETH THE MORNING OF SURGERY WITH YOUR REGULAR TOOTHPASTE   [facility]- Preparing For Surgery  Before surgery, you can play an important role. Because skin is not sterile, your skin needs to be as free of germs as possible. You can reduce the number of germs on your skin by washing with CHG (chlorahexidine gluconate) Soap before surgery.  CHG is an antiseptic cleaner  which kills germs and bonds with the skin to continue killing germs even after washing.     Please do not use if you have an allergy to CHG or antibacterial soaps. If your skin becomes reddened/irritated stop using the CHG.  Do not shave (including legs and underarms) for at least 48 hours prior to first CHG shower. It is OK to shave your face.  Please follow these instructions carefully.     Shower the NIGHT  BEFORE SURGERY and the MORNING OF SURGERY with CHG Soap.   If you chose to wash your hair, wash your hair first as usual with your normal shampoo. After you shampoo, rinse your hair and body thoroughly to remove the shampoo.  Then [employer] and genitals (private parts) with your normal soap and rinse thoroughly to remove soap.  After that Use CHG Soap as you would any other liquid soap. You can apply CHG directly to the skin and wash gently with a scrungie or a clean washcloth.   Apply the CHG Soap to your body ONLY FROM THE NECK DOWN.  Do not use on open wounds or open sores. Avoid contact with your eyes, ears, mouth and genitals (private parts). Wash Face and genitals (private parts)  with your normal soap.   Wash thoroughly, paying special attention to the area where your surgery will be performed.  Thoroughly rinse your body with warm water from the neck down.  DO NOT shower/wash with your normal soap after using and rinsing off the CHG Soap.  Pat yourself dry with a CLEAN TOWEL.  Wear CLEAN PAJAMAS to bed the night before surgery  Place CLEAN SHEETS on your bed the night before your surgery  DO NOT SLEEP WITH PETS.   Day of Surgery: Take a shower with CHG soap. Wear Clean/Comfortable clothing the morning of surgery Do not apply any deodorants/lotions.   Remember to brush your teeth WITH YOUR REGULAR TOOTHPASTE.    If you received a COVID test during your pre-op visit, it is requested that you wear a mask when out in public, stay away from anyone that may not be feeling well, and notify your surgeon if you develop symptoms. If you have been in contact with anyone that has tested positive in the last 10 days, please notify your surgeon.    Please read over the following fact sheets that you were given.

## 2022-10-23 ENCOUNTER — Encounter (HOSPITAL_COMMUNITY): Payer: Self-pay

## 2022-10-23 ENCOUNTER — Encounter (HOSPITAL_COMMUNITY)
Admission: RE | Admit: 2022-10-23 | Discharge: 2022-10-23 | Disposition: A | Discharge status: Home / Self Care | Payer: PPO | Source: Ambulatory Visit | Attending: Vascular Surgery | Admitting: Vascular Surgery

## 2022-10-23 ENCOUNTER — Other Ambulatory Visit: Payer: Self-pay

## 2022-10-23 VITALS — BP 147/73 | HR 72 | Temp 97.6°F | Resp 18 | Ht 66.0 in | Wt 99.8 lb

## 2022-10-23 DIAGNOSIS — Z79899 Other long term (current) drug therapy: Secondary | ICD-10-CM | POA: Diagnosis not present

## 2022-10-23 DIAGNOSIS — G35 Multiple sclerosis: Secondary | ICD-10-CM | POA: Diagnosis not present

## 2022-10-23 DIAGNOSIS — Z01812 Encounter for preprocedural laboratory examination: Secondary | ICD-10-CM | POA: Insufficient documentation

## 2022-10-23 DIAGNOSIS — I714 Abdominal aortic aneurysm, without rupture, unspecified: Secondary | ICD-10-CM | POA: Insufficient documentation

## 2022-10-23 DIAGNOSIS — Z87891 Personal history of nicotine dependence: Secondary | ICD-10-CM | POA: Diagnosis not present

## 2022-10-23 DIAGNOSIS — I5032 Chronic diastolic (congestive) heart failure: Secondary | ICD-10-CM | POA: Diagnosis not present

## 2022-10-23 DIAGNOSIS — I110 Hypertensive heart disease with heart failure: Secondary | ICD-10-CM | POA: Diagnosis not present

## 2022-10-23 DIAGNOSIS — E785 Hyperlipidemia, unspecified: Secondary | ICD-10-CM | POA: Insufficient documentation

## 2022-10-23 DIAGNOSIS — J449 Chronic obstructive pulmonary disease, unspecified: Secondary | ICD-10-CM | POA: Diagnosis not present

## 2022-10-23 DIAGNOSIS — Z01818 Encounter for other preprocedural examination: Secondary | ICD-10-CM

## 2022-10-23 HISTORY — DX: Unspecified osteoarthritis, unspecified site: M19.90

## 2022-10-23 HISTORY — DX: Myoneural disorder, unspecified: G70.9

## 2022-10-23 HISTORY — DX: Gastro-esophageal reflux disease without esophagitis: K21.9

## 2022-10-23 LAB — COMPREHENSIVE METABOLIC PANEL
ALT: 26 U/L (ref 0–44)
AST: 36 U/L (ref 15–41)
Albumin: 3.1 g/dL — ABNORMAL LOW (ref 3.5–5.0)
Alkaline Phosphatase: 77 U/L (ref 38–126)
Anion gap: 10 (ref 5–15)
BUN: 14 mg/dL (ref 8–23)
CO2: 26 mmol/L (ref 22–32)
Calcium: 9.1 mg/dL (ref 8.9–10.3)
Chloride: 98 mmol/L (ref 98–111)
Creatinine, Ser: 1.1 mg/dL — ABNORMAL HIGH (ref 0.44–1.00)
GFR, Estimated: 53 mL/min — ABNORMAL LOW (ref >60)
Glucose, Bld: 106 mg/dL — ABNORMAL HIGH (ref 70–99)
Potassium: 2.9 mmol/L — ABNORMAL LOW (ref 3.5–5.1)
Sodium: 134 mmol/L — ABNORMAL LOW (ref 135–145)
Total Bilirubin: 0.6 mg/dL (ref 0.3–1.2)
Total Protein: 6.5 g/dL (ref 6.5–8.1)

## 2022-10-23 LAB — CBC
HCT: 39.6 % (ref 36.0–46.0)
Hemoglobin: 13.1 g/dL (ref 12.0–15.0)
MCH: 29.2 pg (ref 26.0–34.0)
MCHC: 33.1 g/dL (ref 30.0–36.0)
MCV: 88.2 fL (ref 80.0–100.0)
Platelets: 161 10*3/uL (ref 150–400)
RBC: 4.49 MIL/uL (ref 3.87–5.11)
RDW: 12.2 % (ref 11.5–15.5)
WBC: 7.9 10*3/uL (ref 4.0–10.5)
nRBC: 0 % (ref 0.0–0.2)

## 2022-10-23 LAB — APTT: aPTT: 33 seconds (ref 24–36)

## 2022-10-23 LAB — PROTIME-INR
INR: 1 (ref 0.8–1.2)
Prothrombin Time: 13.4 seconds (ref 11.4–15.2)

## 2022-10-23 LAB — SURGICAL PCR SCREEN
MRSA, PCR: NEGATIVE
Staphylococcus aureus: NEGATIVE

## 2022-10-23 LAB — TYPE AND SCREEN
ABO/RH(D): B POS
Antibody Screen: NEGATIVE

## 2022-10-23 NOTE — Progress Notes (Signed)
PCP - Dr. Seabron Spates Cardiologist - Dr. [name] Pulm: Dr. Kalman Shan Neuro: Butch Penny NP  PPM/ICD - Denies  Chest x-ray - N/A EKG - 09/19/22 Stress Test - 09/28/22 ECHO - 10/18/21 Cardiac Cath - 07/09/19  Sleep Study - Denies  Diabetes: Denies  Blood Thinner Instructions: N/A Aspirin Instructions: N/A  ERAS Protcol - No  COVID TEST- N/A   Anesthesia review: Yes, cardiac & MS hx; cardiac clearance 09/19/22  Patient denies shortness of breath, fever, cough and chest pain at PAT appointment   All instructions explained to the patient, with a verbal understanding of the material. Patient agrees to go over the instructions while at home for a better understanding. Patient also instructed to self quarantine after being tested for COVID-19. The opportunity to ask questions was provided.

## 2022-10-24 NOTE — Progress Notes (Signed)
Anesthesia Chart Review:  Follows with cardiology at [facility] for hx of HTN, HLD, nonobstructive CQAD, AAA, HFpEF. Cassandra Rios was initially seen by Dr. Katrinka Blazing in 2020 for evaluation of shortness of breath.  Cassandra Rios had a cardiac CTA completed that showed significant stenosis in proximal left circumflex.  Left heart cath was recommended and patient underwent procedure on 07/09/2019 mid LAD concentric 60 to 70% narrowing and proximal to mid circumflex with 70% bifurcation stenosis.  Medical management was recommended with aggressive preventative therapy.  Cassandra Rios was seen in 09/2021 with left parasternal chest discomfort.  Cassandra Rios underwent Lexiscan Myoview that was normal and low risk. Seen by Robin Searing, NP 09/19/22 for preop eval. Per note, "-Patient was unable to complete 4 METS of activity due to current health condition.  Cassandra Rios underwent a Lexiscan Myoview that was low risk and showed no evidence of ischemia.  Cassandra Rios would therefore be at acceptable risk to proceed with her scheduled procedure with no further cardiovascular testing."  Follows with neurology for hx of MS, maintained on Betaseron.   Former smoker, 80 pack years (quit 2008), with associated COPD. Not on any daily inhaled medications.   Preop labs reviewed, mild hyponatremia sodium 134, moderate hypokalemia potassium 2.9, otherwise unremarkable. Dr. Randie Heinz has reviewed results. Will order recheck istat on DOS.   EKG 09/19/22: NSR. Rate 69. RBBB  Nuclear stress 09/28/22:   The study is normal. The study is low risk.   No ST deviation was noted.   Left ventricular function is normal. Nuclear stress EF: 71 %. The left ventricular ejection fraction is hyperdynamic (>65%). End diastolic cavity size is normal.   Prior study available for comparison from 10/04/2021.  TTE 10/19/22: 1. Left ventricular ejection fraction, by estimation, is 70 to 75%. The  left ventricle has hyperdynamic function. The left ventricle has no  regional wall motion abnormalities. Left ventricular  diastolic parameters  are consistent with Grade II diastolic  dysfunction (pseudonormalization). Elevated left atrial pressure.   2. Right ventricular systolic function is normal. The right ventricular  size is normal.   3. The mitral valve is normal in structure. No evidence of mitral valve  regurgitation. No evidence of mitral stenosis.   4. The aortic valve is normal in structure. Aortic valve regurgitation is  not visualized. No aortic stenosis is present.   5. The inferior vena cava is normal in size with <50% respiratory  variability, suggesting right atrial pressure of 8 mmHg.     [facility] Short Stay Center/Anesthesiology Phone [phone] 10/24/2022 12:25 PM

## 2022-10-24 NOTE — Anesthesia Preprocedure Evaluation (Addendum)
Anesthesia Evaluation  Patient identified by MRN, date of birth, ID band Patient awake    Reviewed: Allergy & Precautions, H&P , NPO status , Patient's Chart, lab work & pertinent test results  Airway Mallampati: III  TM Distance: <3 FB Neck ROM: Full    Dental no notable dental hx.    Pulmonary COPD, former smoker   Pulmonary exam normal breath sounds clear to auscultation       Cardiovascular hypertension, + Peripheral Vascular Disease and + DOE  Normal cardiovascular exam Rhythm:Regular Rate:Normal     Neuro/Psych   Anxiety Depression    MS  Neuromuscular disease    GI/Hepatic Neg liver ROS,GERD  ,,  Endo/Other  Hypothyroidism    Renal/GU negative Renal ROS  negative genitourinary   Musculoskeletal negative musculoskeletal ROS (+)    Abdominal   Peds negative pediatric ROS (+)  Hematology negative hematology ROS (+)   Anesthesia Other Findings   Reproductive/Obstetrics negative OB ROS                             Anesthesia Physical Anesthesia Plan  ASA: 3  Anesthesia Plan: General   Post-op Pain Management: Minimal or no pain anticipated   Induction: Intravenous  PONV Risk Score and Plan: 2 and Ondansetron, Dexamethasone and Treatment may vary due to age or medical condition  Airway Management Planned: Oral ETT  Additional Equipment: Arterial line  Intra-op Plan:   Post-operative Plan: Extubation in OR  Informed Consent: I have reviewed the patients History and Physical, chart, labs and discussed the procedure including the risks, benefits and alternatives for the proposed anesthesia with the patient or authorized representative who has indicated his/her understanding and acceptance.     Dental advisory given  Plan Discussed with: CRNA and Surgeon  Anesthesia Plan Comments: (PAT note by Antionette Poles, PA-C: Follows with cardiology at [facility] for hx of HTN, HLD,  nonobstructive CQAD, AAA, HFpEF. She was initially seen by Dr. Katrinka Blazing in 2020 for evaluation of shortness of breath.  She had a cardiac CTA completed that showed significant stenosis in proximal left circumflex.  Left heart cath was recommended and patient underwent procedure on 07/09/2019 mid LAD concentric 60 to 70% narrowing and proximal to mid circumflex with 70% bifurcation stenosis.  Medical management was recommended with aggressive preventative therapy.  She was seen in 09/2021 with left parasternal chest discomfort.  She underwent Lexiscan Myoview that was normal and low risk. Seen by Robin Searing, NP 09/19/22 for preop eval. Per note, "-Patient was unable to complete 4 METS of activity due to current health condition.  She underwent a Lexiscan Myoview that was low risk and showed no evidence of ischemia.  She would therefore be at acceptable risk to proceed with her scheduled procedure with no further cardiovascular testing."   Follows with neurology for hx of MS, maintained on Betaseron.    Former smoker, 80 pack years (quit 2008), with associated COPD. Not on any daily inhaled medications.    Preop labs reviewed, mild hyponatremia sodium 134, moderate hypokalemia potassium 2.9, otherwise unremarkable. Dr. Randie Heinz has reviewed results. Will order recheck istat on DOS.    EKG 09/19/22: NSR. Rate 69. RBBB   Nuclear stress 09/28/22:   The study is normal. The study is low risk.   No ST deviation was noted.   Left ventricular function is normal. Nuclear stress EF: 71 %. The left ventricular ejection fraction is hyperdynamic (>65%). End diastolic  cavity size is normal.   Prior study available for comparison from 10/04/2021.   TTE 10/19/22: 1. Left ventricular ejection fraction, by estimation, is 70 to 75%. The  left ventricle has hyperdynamic function. The left ventricle has no  regional wall motion abnormalities. Left ventricular diastolic parameters  are consistent with Grade II diastolic   dysfunction (pseudonormalization). Elevated left atrial pressure.   2. Right ventricular systolic function is normal. The right ventricular  size is normal.   3. The mitral valve is normal in structure. No evidence of mitral valve  regurgitation. No evidence of mitral stenosis.   4. The aortic valve is normal in structure. Aortic valve regurgitation is  not visualized. No aortic stenosis is present.   5. The inferior vena cava is normal in size with <50% respiratory  variability, suggesting right atrial pressure of 8 mmHg.     )        Anesthesia Quick Evaluation

## 2022-10-31 ENCOUNTER — Inpatient Hospital Stay (HOSPITAL_COMMUNITY)
Admission: RE | Admit: 2022-10-31 | Discharge: 2022-11-01 | DRG: 269 | Disposition: A | Discharge status: Home / Self Care | Payer: PPO | Attending: Vascular Surgery | Admitting: Vascular Surgery

## 2022-10-31 ENCOUNTER — Inpatient Hospital Stay (HOSPITAL_COMMUNITY): Payer: PPO

## 2022-10-31 ENCOUNTER — Ambulatory Visit: Payer: PPO | Admitting: Physician Assistant

## 2022-10-31 ENCOUNTER — Encounter (HOSPITAL_COMMUNITY): Payer: Self-pay | Admitting: Vascular Surgery

## 2022-10-31 ENCOUNTER — Encounter (HOSPITAL_COMMUNITY)
Admission: RE | Disposition: A | Discharge status: Home / Self Care | Payer: Self-pay | Source: Home / Self Care | Attending: Vascular Surgery

## 2022-10-31 ENCOUNTER — Other Ambulatory Visit: Payer: Self-pay

## 2022-10-31 ENCOUNTER — Inpatient Hospital Stay (HOSPITAL_COMMUNITY): Payer: PPO | Admitting: Anesthesiology

## 2022-10-31 ENCOUNTER — Inpatient Hospital Stay (HOSPITAL_COMMUNITY): Payer: PPO | Admitting: Physician Assistant

## 2022-10-31 DIAGNOSIS — J449 Chronic obstructive pulmonary disease, unspecified: Secondary | ICD-10-CM | POA: Diagnosis not present

## 2022-10-31 DIAGNOSIS — Z87891 Personal history of nicotine dependence: Secondary | ICD-10-CM | POA: Diagnosis not present

## 2022-10-31 DIAGNOSIS — E785 Hyperlipidemia, unspecified: Secondary | ICD-10-CM | POA: Diagnosis not present

## 2022-10-31 DIAGNOSIS — I10 Essential (primary) hypertension: Secondary | ICD-10-CM | POA: Diagnosis not present

## 2022-10-31 DIAGNOSIS — Z825 Family history of asthma and other chronic lower respiratory diseases: Secondary | ICD-10-CM | POA: Diagnosis not present

## 2022-10-31 DIAGNOSIS — Z86010 Personal history of colonic polyps: Secondary | ICD-10-CM

## 2022-10-31 DIAGNOSIS — Z8249 Family history of ischemic heart disease and other diseases of the circulatory system: Secondary | ICD-10-CM

## 2022-10-31 DIAGNOSIS — Z90710 Acquired absence of both cervix and uterus: Secondary | ICD-10-CM | POA: Diagnosis not present

## 2022-10-31 DIAGNOSIS — K219 Gastro-esophageal reflux disease without esophagitis: Secondary | ICD-10-CM | POA: Diagnosis not present

## 2022-10-31 DIAGNOSIS — Z8261 Family history of arthritis: Secondary | ICD-10-CM | POA: Diagnosis not present

## 2022-10-31 DIAGNOSIS — I714 Abdominal aortic aneurysm, without rupture, unspecified: Secondary | ICD-10-CM | POA: Diagnosis not present

## 2022-10-31 DIAGNOSIS — E039 Hypothyroidism, unspecified: Secondary | ICD-10-CM | POA: Diagnosis present

## 2022-10-31 DIAGNOSIS — Z823 Family history of stroke: Secondary | ICD-10-CM

## 2022-10-31 DIAGNOSIS — Z79890 Hormone replacement therapy (postmenopausal): Secondary | ICD-10-CM

## 2022-10-31 DIAGNOSIS — Z8711 Personal history of peptic ulcer disease: Secondary | ICD-10-CM

## 2022-10-31 DIAGNOSIS — Z01818 Encounter for other preprocedural examination: Secondary | ICD-10-CM

## 2022-10-31 DIAGNOSIS — G35 Multiple sclerosis: Secondary | ICD-10-CM | POA: Diagnosis not present

## 2022-10-31 HISTORY — PX: ABDOMINAL AORTIC ENDOVASCULAR STENT GRAFT: SHX5707

## 2022-10-31 HISTORY — PX: ULTRASOUND GUIDANCE FOR VASCULAR ACCESS: SHX6516

## 2022-10-31 LAB — URINALYSIS, ROUTINE W REFLEX MICROSCOPIC
Bilirubin Urine: NEGATIVE
Glucose, UA: NEGATIVE mg/dL
Hgb urine dipstick: NEGATIVE
Ketones, ur: NEGATIVE mg/dL
Nitrite: NEGATIVE
Protein, ur: NEGATIVE mg/dL
Specific Gravity, Urine: 1.012 (ref 1.005–1.030)
pH: 6 (ref 5.0–8.0)

## 2022-10-31 LAB — POCT I-STAT, CHEM 8
BUN: 12 mg/dL (ref 8–23)
Calcium, Ion: 1.19 mmol/L (ref 1.15–1.40)
Chloride: 98 mmol/L (ref 98–111)
Creatinine, Ser: 1 mg/dL (ref 0.44–1.00)
Glucose, Bld: 81 mg/dL (ref 70–99)
HCT: 38 % (ref 36.0–46.0)
Hemoglobin: 12.9 g/dL (ref 12.0–15.0)
Potassium: 3.2 mmol/L — ABNORMAL LOW (ref 3.5–5.1)
Sodium: 136 mmol/L (ref 135–145)
TCO2: 28 mmol/L (ref 22–32)

## 2022-10-31 LAB — BASIC METABOLIC PANEL
Anion gap: 6 (ref 5–15)
BUN: 8 mg/dL (ref 8–23)
CO2: 21 mmol/L — ABNORMAL LOW (ref 22–32)
Calcium: 8.1 mg/dL — ABNORMAL LOW (ref 8.9–10.3)
Chloride: 104 mmol/L (ref 98–111)
Creatinine, Ser: 0.94 mg/dL (ref 0.44–1.00)
GFR, Estimated: >60 mL/min (ref >60)
Glucose, Bld: 117 mg/dL — ABNORMAL HIGH (ref 70–99)
Potassium: 3 mmol/L — ABNORMAL LOW (ref 3.5–5.1)
Sodium: 131 mmol/L — ABNORMAL LOW (ref 135–145)

## 2022-10-31 LAB — CBC
HCT: 33.2 % — ABNORMAL LOW (ref 36.0–46.0)
Hemoglobin: 10.9 g/dL — ABNORMAL LOW (ref 12.0–15.0)
MCH: 29.1 pg (ref 26.0–34.0)
MCHC: 32.8 g/dL (ref 30.0–36.0)
MCV: 88.8 fL (ref 80.0–100.0)
Platelets: 106 10*3/uL — ABNORMAL LOW (ref 150–400)
RBC: 3.74 MIL/uL — ABNORMAL LOW (ref 3.87–5.11)
RDW: 12.5 % (ref 11.5–15.5)
WBC: 6.5 10*3/uL (ref 4.0–10.5)
nRBC: 0 % (ref 0.0–0.2)

## 2022-10-31 LAB — POCT ACTIVATED CLOTTING TIME
Activated Clotting Time: 223 seconds
Activated Clotting Time: 239 seconds

## 2022-10-31 LAB — PROTIME-INR
INR: 1.2 (ref 0.8–1.2)
Prothrombin Time: 15.1 seconds (ref 11.4–15.2)

## 2022-10-31 LAB — APTT: aPTT: 47 seconds — ABNORMAL HIGH (ref 24–36)

## 2022-10-31 LAB — MAGNESIUM: Magnesium: 1.7 mg/dL (ref 1.7–2.4)

## 2022-10-31 SURGERY — INSERTION, ENDOVASCULAR STENT GRAFT, AORTA, ABDOMINAL
Anesthesia: General | Site: Groin

## 2022-10-31 MED ORDER — PHENOL 1.4 % MT LIQD: 1.0000 | OROMUCOSAL | Status: DC | PRN

## 2022-10-31 MED ORDER — OXYCODONE HCL 5 MG/5ML PO SOLN: 5.0000 mg | Freq: Once | ORAL | Status: DC | PRN

## 2022-10-31 MED ORDER — HYDROCHLOROTHIAZIDE 25 MG PO TABS
25.0000 mg | ORAL_TABLET | Freq: Every day | ORAL | Status: DC
  Administered 2022-10-31 – 2022-11-01 (×2): 25 mg via ORAL
  Filled 2022-10-31 (×2): qty 1

## 2022-10-31 MED ORDER — CHLORHEXIDINE GLUCONATE CLOTH 2 % EX PADS
6.0000 | MEDICATED_PAD | Freq: Once | CUTANEOUS | Status: DC
  Administered 2022-10-31: 6 via TOPICAL

## 2022-10-31 MED ORDER — ACETAMINOPHEN 325 MG PO TABS
325.0000 mg | ORAL_TABLET | ORAL | Status: DC | PRN
  Administered 2022-10-31 – 2022-11-01 (×2): 650 mg via ORAL
  Filled 2022-10-31 (×2): qty 2

## 2022-10-31 MED ORDER — INTERFERON BETA-1B 0.3 MG ~~LOC~~ KIT: 0.2500 mg | PACK | SUBCUTANEOUS | Status: DC

## 2022-10-31 MED ORDER — ACETAMINOPHEN 650 MG RE SUPP: 325.0000 mg | RECTAL | Status: DC | PRN

## 2022-10-31 MED ORDER — LACTATED RINGERS IV SOLN: INTRAVENOUS | Status: DC

## 2022-10-31 MED ORDER — LIDOCAINE 2% (20 MG/ML) 5 ML SYRINGE
INTRAMUSCULAR | Status: DC | PRN
  Administered 2022-10-31: 100 mg via INTRAVENOUS

## 2022-10-31 MED ORDER — ONDANSETRON HCL 4 MG/2ML IJ SOLN: 4.0000 mg | Freq: Once | INTRAMUSCULAR | Status: DC | PRN

## 2022-10-31 MED ORDER — ONDANSETRON HCL 4 MG/2ML IJ SOLN
INTRAMUSCULAR | Status: DC | PRN
  Administered 2022-10-31: 4 mg via INTRAVENOUS

## 2022-10-31 MED ORDER — ROCURONIUM BROMIDE 10 MG/ML (PF) SYRINGE
PREFILLED_SYRINGE | INTRAVENOUS | Status: AC
  Filled 2022-10-31: qty 10

## 2022-10-31 MED ORDER — DOCUSATE SODIUM 100 MG PO CAPS
100.0000 mg | ORAL_CAPSULE | Freq: Every day | ORAL | Status: DC
  Administered 2022-11-01: 100 mg via ORAL
  Filled 2022-10-31: qty 1

## 2022-10-31 MED ORDER — LABETALOL HCL 5 MG/ML IV SOLN
10.0000 mg | INTRAVENOUS | Status: DC | PRN
  Administered 2022-10-31: 10 mg via INTRAVENOUS
  Filled 2022-10-31: qty 4

## 2022-10-31 MED ORDER — ORAL CARE MOUTH RINSE: 15.0000 mL | Freq: Once | OROMUCOSAL | Status: AC

## 2022-10-31 MED ORDER — HEPARIN SODIUM (PORCINE) 1000 UNIT/ML IJ SOLN
INTRAMUSCULAR | Status: AC
  Filled 2022-10-31: qty 20

## 2022-10-31 MED ORDER — SODIUM CHLORIDE 0.9 % IV SOLN: 500.0000 mL | Freq: Once | INTRAVENOUS | Status: DC | PRN

## 2022-10-31 MED ORDER — PROTAMINE SULFATE 10 MG/ML IV SOLN
INTRAVENOUS | Status: AC
  Filled 2022-10-31: qty 5

## 2022-10-31 MED ORDER — OXYCODONE HCL 5 MG PO TABS: 5.0000 mg | ORAL_TABLET | Freq: Once | ORAL | Status: DC | PRN

## 2022-10-31 MED ORDER — ACETAMINOPHEN 500 MG PO TABS: 1000.0000 mg | ORAL_TABLET | Freq: Once | ORAL | Status: AC

## 2022-10-31 MED ORDER — FENTANYL CITRATE (PF) 250 MCG/5ML IJ SOLN
INTRAMUSCULAR | Status: AC
  Filled 2022-10-31: qty 5

## 2022-10-31 MED ORDER — ROCURONIUM BROMIDE 10 MG/ML (PF) SYRINGE
PREFILLED_SYRINGE | INTRAVENOUS | Status: DC | PRN
  Administered 2022-10-31: 65 mg via INTRAVENOUS
  Administered 2022-10-31: 25 mg via INTRAVENOUS

## 2022-10-31 MED ORDER — PHENYLEPHRINE 80 MCG/ML (10ML) SYRINGE FOR IV PUSH (FOR BLOOD PRESSURE SUPPORT)
PREFILLED_SYRINGE | INTRAVENOUS | Status: AC
  Filled 2022-10-31: qty 10

## 2022-10-31 MED ORDER — GABAPENTIN 300 MG PO CAPS
600.0000 mg | ORAL_CAPSULE | Freq: Every evening | ORAL | Status: DC
  Administered 2022-10-31: 600 mg via ORAL
  Filled 2022-10-31: qty 2

## 2022-10-31 MED ORDER — ATORVASTATIN CALCIUM 80 MG PO TABS
80.0000 mg | ORAL_TABLET | Freq: Every day | ORAL | Status: DC
  Administered 2022-10-31 – 2022-11-01 (×2): 80 mg via ORAL
  Filled 2022-10-31 (×2): qty 1

## 2022-10-31 MED ORDER — DEXAMETHASONE SODIUM PHOSPHATE 10 MG/ML IJ SOLN
INTRAMUSCULAR | Status: AC
  Filled 2022-10-31: qty 1

## 2022-10-31 MED ORDER — PHENYLEPHRINE HCL-NACL 20-0.9 MG/250ML-% IV SOLN
INTRAVENOUS | Status: DC | PRN
  Administered 2022-10-31: 80 ug via INTRAVENOUS
  Administered 2022-10-31: 50 ug/min via INTRAVENOUS
  Administered 2022-10-31: 80 ug via INTRAVENOUS
  Administered 2022-10-31: 160 ug via INTRAVENOUS

## 2022-10-31 MED ORDER — ACETAMINOPHEN 500 MG PO TABS: 1000.0000 mg | ORAL_TABLET | Freq: Once | ORAL | Status: DC

## 2022-10-31 MED ORDER — FLUOXETINE HCL 20 MG PO CAPS
40.0000 mg | ORAL_CAPSULE | Freq: Every day | ORAL | Status: DC
  Administered 2022-10-31 – 2022-11-01 (×2): 40 mg via ORAL
  Filled 2022-10-31 (×2): qty 2

## 2022-10-31 MED ORDER — GUAIFENESIN-DM 100-10 MG/5ML PO SYRP: 15.0000 mL | ORAL_SOLUTION | ORAL | Status: DC | PRN

## 2022-10-31 MED ORDER — ACETAMINOPHEN 500 MG PO TABS: 750.0000 mg | ORAL_TABLET | Freq: Once | ORAL | Status: DC

## 2022-10-31 MED ORDER — CEFAZOLIN SODIUM-DEXTROSE 2-4 GM/100ML-% IV SOLN
2.0000 g | INTRAVENOUS | Status: AC
  Administered 2022-10-31: 2 g via INTRAVENOUS
  Filled 2022-10-31: qty 100

## 2022-10-31 MED ORDER — MAGNESIUM SULFATE 2 GM/50ML IV SOLN: 2.0000 g | Freq: Every day | INTRAVENOUS | Status: DC | PRN

## 2022-10-31 MED ORDER — IODIXANOL 320 MG/ML IV SOLN
INTRAVENOUS | Status: DC | PRN
  Administered 2022-10-31: 65.9 mL via INTRA_ARTERIAL

## 2022-10-31 MED ORDER — ONDANSETRON HCL 4 MG/2ML IJ SOLN: 4.0000 mg | Freq: Four times a day (QID) | INTRAMUSCULAR | Status: DC | PRN

## 2022-10-31 MED ORDER — PANTOPRAZOLE SODIUM 40 MG PO TBEC
40.0000 mg | DELAYED_RELEASE_TABLET | Freq: Every day | ORAL | Status: DC
  Administered 2022-10-31 – 2022-11-01 (×2): 40 mg via ORAL
  Filled 2022-10-31 (×2): qty 1

## 2022-10-31 MED ORDER — ALUM & MAG HYDROXIDE-SIMETH 200-200-20 MG/5ML PO SUSP: 15.0000 mL | ORAL | Status: DC | PRN

## 2022-10-31 MED ORDER — PROTAMINE SULFATE 10 MG/ML IV SOLN
INTRAVENOUS | Status: DC | PRN
  Administered 2022-10-31: 50 mg via INTRAVENOUS

## 2022-10-31 MED ORDER — LIDOCAINE 2% (20 MG/ML) 5 ML SYRINGE
INTRAMUSCULAR | Status: AC
  Filled 2022-10-31: qty 5

## 2022-10-31 MED ORDER — HEPARIN 6000 UNIT IRRIGATION SOLUTION
Status: DC | PRN
  Administered 2022-10-31: 1

## 2022-10-31 MED ORDER — FENTANYL CITRATE (PF) 100 MCG/2ML IJ SOLN: 25.0000 ug | INTRAMUSCULAR | Status: DC | PRN

## 2022-10-31 MED ORDER — CEFAZOLIN SODIUM-DEXTROSE 2-4 GM/100ML-% IV SOLN
2.0000 g | Freq: Three times a day (TID) | INTRAVENOUS | Status: AC
  Administered 2022-10-31 (×2): 2 g via INTRAVENOUS
  Filled 2022-10-31 (×2): qty 100

## 2022-10-31 MED ORDER — PROPOFOL 10 MG/ML IV BOLUS
INTRAVENOUS | Status: DC | PRN
  Administered 2022-10-31: 140 mg via INTRAVENOUS
  Administered 2022-10-31: 50 ug/kg/min via INTRAVENOUS

## 2022-10-31 MED ORDER — FENTANYL CITRATE (PF) 250 MCG/5ML IJ SOLN
INTRAMUSCULAR | Status: DC | PRN
  Administered 2022-10-31: 50 ug via INTRAVENOUS

## 2022-10-31 MED ORDER — LEVOTHYROXINE SODIUM 88 MCG PO TABS
88.0000 ug | ORAL_TABLET | Freq: Every day | ORAL | Status: DC
  Administered 2022-10-31 – 2022-11-01 (×2): 88 ug via ORAL
  Filled 2022-10-31 (×2): qty 1

## 2022-10-31 MED ORDER — ONDANSETRON HCL 4 MG/2ML IJ SOLN
INTRAMUSCULAR | Status: AC
  Filled 2022-10-31: qty 2

## 2022-10-31 MED ORDER — SUGAMMADEX SODIUM 200 MG/2ML IV SOLN
INTRAVENOUS | Status: DC | PRN
  Administered 2022-10-31: 180.6 mg via INTRAVENOUS

## 2022-10-31 MED ORDER — 0.9 % SODIUM CHLORIDE (POUR BTL) OPTIME
TOPICAL | Status: DC | PRN
  Administered 2022-10-31: 1000 mL

## 2022-10-31 MED ORDER — HYDRALAZINE HCL 20 MG/ML IJ SOLN: 5.0000 mg | INTRAMUSCULAR | Status: DC | PRN

## 2022-10-31 MED ORDER — OXYCODONE HCL 5 MG PO TABS
5.0000 mg | ORAL_TABLET | ORAL | Status: DC | PRN
  Administered 2022-10-31: 5 mg via ORAL
  Filled 2022-10-31: qty 1

## 2022-10-31 MED ORDER — POTASSIUM CHLORIDE CRYS ER 20 MEQ PO TBCR: 20.0000 meq | EXTENDED_RELEASE_TABLET | Freq: Every day | ORAL | Status: DC | PRN

## 2022-10-31 MED ORDER — ACETAMINOPHEN 500 MG PO TABS
ORAL_TABLET | ORAL | Status: AC
  Administered 2022-10-31: 1000 mg via ORAL
  Filled 2022-10-31: qty 2

## 2022-10-31 MED ORDER — HEPARIN SODIUM (PORCINE) 1000 UNIT/ML IJ SOLN
INTRAMUSCULAR | Status: DC | PRN
  Administered 2022-10-31: 3000 [IU] via INTRAVENOUS
  Administered 2022-10-31: 10000 [IU] via INTRAVENOUS

## 2022-10-31 MED ORDER — PROPOFOL 10 MG/ML IV BOLUS
INTRAVENOUS | Status: AC
  Filled 2022-10-31: qty 20

## 2022-10-31 MED ORDER — LOSARTAN POTASSIUM 50 MG PO TABS
100.0000 mg | ORAL_TABLET | Freq: Every day | ORAL | Status: DC
  Administered 2022-10-31 – 2022-11-01 (×2): 100 mg via ORAL
  Filled 2022-10-31 (×2): qty 2

## 2022-10-31 MED ORDER — SODIUM CHLORIDE 0.9 % IV SOLN: INTRAVENOUS | Status: DC

## 2022-10-31 MED ORDER — MECLIZINE HCL 12.5 MG PO TABS: 12.5000 mg | ORAL_TABLET | Freq: Three times a day (TID) | ORAL | Status: DC | PRN

## 2022-10-31 MED ORDER — HEPARIN SODIUM (PORCINE) 5000 UNIT/ML IJ SOLN
5000.0000 [IU] | Freq: Three times a day (TID) | INTRAMUSCULAR | Status: DC
  Administered 2022-11-01: 5000 [IU] via SUBCUTANEOUS
  Filled 2022-10-31: qty 1

## 2022-10-31 MED ORDER — DEXAMETHASONE SODIUM PHOSPHATE 10 MG/ML IJ SOLN
INTRAMUSCULAR | Status: DC | PRN
  Administered 2022-10-31: 10 mg via INTRAVENOUS

## 2022-10-31 MED ORDER — CHLORHEXIDINE GLUCONATE 0.12 % MT SOLN
15.0000 mL | Freq: Once | OROMUCOSAL | Status: AC
  Administered 2022-10-31: 15 mL via OROMUCOSAL
  Filled 2022-10-31: qty 15

## 2022-10-31 MED ORDER — METOPROLOL TARTRATE 5 MG/5ML IV SOLN: 2.0000 mg | INTRAVENOUS | Status: DC | PRN

## 2022-10-31 SURGICAL SUPPLY — 48 items
ADH SKN CLS APL DERMABOND .7 (GAUZE/BANDAGES/DRESSINGS) ×4
BAG COUNTER SPONGE SURGICOUNT (BAG) ×3 IMPLANT
BAG SPNG CNTER NS LX DISP (BAG) ×2
BLADE CLIPPER SURG (BLADE) ×3 IMPLANT
CANISTER SUCT 3000ML PPV (MISCELLANEOUS) ×3 IMPLANT
CATH BEACON 5.038 65CM KMP-01 (CATHETERS) ×3 IMPLANT
CATH OMNI FLUSH .035X70CM (CATHETERS) ×3 IMPLANT
CLIP LIGATING EXTRA MED SLVR (CLIP) IMPLANT
CLIP LIGATING EXTRA SM BLUE (MISCELLANEOUS) IMPLANT
DERMABOND ADVANCED .7 DNX12 (GAUZE/BANDAGES/DRESSINGS) ×3 IMPLANT
DEVICE CLOSURE PERCLS PRGLD 6F (VASCULAR PRODUCTS) ×12 IMPLANT
DEVICE TORQUE KENDALL .025-038 (MISCELLANEOUS) IMPLANT
ELECT REM PT RETURN 9FT ADLT (ELECTROSURGICAL) ×4
ELECTRODE REM PT RTRN 9FT ADLT (ELECTROSURGICAL) ×6 IMPLANT
EXCLDR TRNK 28.5X14.5X12 16F (Endovascular Graft) ×2 IMPLANT
EXCLUDER TNK 28.5X14.5X12 16F (Endovascular Graft) IMPLANT
GAUZE 4X4 16PLY ~~LOC~~+RFID DBL (SPONGE) ×3 IMPLANT
GAUZE SPONGE 2X2 8PLY STRL LF (GAUZE/BANDAGES/DRESSINGS) ×6 IMPLANT
GLIDEWIRE ADV .035X180CM (WIRE) ×3 IMPLANT
GLOVE BIO SURGEON STRL SZ7.5 (GLOVE) ×3 IMPLANT
GOWN STRL REUS W/ TWL LRG LVL3 (GOWN DISPOSABLE) ×6 IMPLANT
GOWN STRL REUS W/ TWL XL LVL3 (GOWN DISPOSABLE) ×6 IMPLANT
GOWN STRL REUS W/TWL LRG LVL3 (GOWN DISPOSABLE) ×4
GOWN STRL REUS W/TWL XL LVL3 (GOWN DISPOSABLE) ×4
GRAFT BALLN CATH 65CM (BALLOONS) ×3 IMPLANT
GUIDEWIRE ANGLED .035X150CM (WIRE) IMPLANT
KIT BASIN OR (CUSTOM PROCEDURE TRAY) ×3 IMPLANT
KIT TURNOVER KIT B (KITS) ×3 IMPLANT
LEG CONTRALATERAL 16X14.5X12 (Vascular Products) IMPLANT
LEG CONTRALATERAL 16X18X9.5 (Endovascular Graft) IMPLANT
NS IRRIG 1000ML POUR BTL (IV SOLUTION) ×3 IMPLANT
PACK ENDOVASCULAR (PACKS) ×3 IMPLANT
PAD ARMBOARD 7.5X6 YLW CONV (MISCELLANEOUS) ×6 IMPLANT
PERCLOSE PROGLIDE 6F (VASCULAR PRODUCTS) ×8
SET MICROPUNCTURE 5F STIFF (MISCELLANEOUS) ×3 IMPLANT
SHEATH BRITE TIP 8FR 23CM (SHEATH) ×3 IMPLANT
SHEATH DRYSEAL FLEX 12FR 33CM (SHEATH) IMPLANT
SHEATH DRYSEAL FLEX 16FR 33CM (SHEATH) IMPLANT
SHEATH DRYSEAL FLEX 18FR 33CM (SHEATH) IMPLANT
SHEATH PINNACLE 8F 10CM (SHEATH) ×3 IMPLANT
STOPCOCK MORSE 400PSI 3WAY (MISCELLANEOUS) ×3 IMPLANT
SUT MNCRL AB 4-0 PS2 18 (SUTURE) ×6 IMPLANT
SYR 20ML LL LF (SYRINGE) ×3 IMPLANT
TOWEL GREEN STERILE (TOWEL DISPOSABLE) ×3 IMPLANT
TRAY FOLEY MTR SLVR 14FR STAT (SET/KITS/TRAYS/PACK) IMPLANT
TUBING INJECTOR 48 (MISCELLANEOUS) ×3 IMPLANT
WIRE AMPLATZ SS-J .035X180CM (WIRE) ×3 IMPLANT
WIRE BENTSON .035X145CM (WIRE) ×6 IMPLANT

## 2022-10-31 NOTE — Anesthesia Postprocedure Evaluation (Signed)
Anesthesia Post Note  Patient: Cassandra Rios  Procedure(s) Performed: ABDOMINAL AORTIC ENDOVASCULAR STENT GRAFT ULTRASOUND GUIDANCE FOR VASCULAR ACCESS (Bilateral: Groin)     Patient location during evaluation: PACU Anesthesia Type: General Level of consciousness: awake and alert Pain management: pain level controlled Vital Signs Assessment: post-procedure vital signs reviewed and stable Respiratory status: spontaneous breathing, nonlabored ventilation, respiratory function stable and patient connected to nasal cannula oxygen Cardiovascular status: blood pressure returned to baseline and stable Postop Assessment: no apparent nausea or vomiting Anesthetic complications: no  No notable events documented.  Last Vitals:  Vitals:   10/31/22 1245 10/31/22 1300  BP: (!) 185/88 (!) 158/69  Pulse: (!) 53 (!) 54  Resp: 14 13  Temp:    SpO2: 100% 100%    Last Pain:  Vitals:   10/31/22 1300  TempSrc:   PainSc: 0-No pain                 Breannah Kratt S

## 2022-10-31 NOTE — Transfer of Care (Signed)
Immediate Anesthesia Transfer of Care Note  Patient: Cassandra Rios  Procedure(s) Performed: ABDOMINAL AORTIC ENDOVASCULAR STENT GRAFT ULTRASOUND GUIDANCE FOR VASCULAR ACCESS (Bilateral: Groin)  Patient Location: PACU  Anesthesia Type:General  Level of Consciousness: awake, alert , and oriented  Airway & Oxygen Therapy: Patient Spontanous Breathing and Patient connected to nasal cannula oxygen  Post-op Assessment: Report given to RN, Post -op Vital signs reviewed and stable, Patient moving all extremities X 4, and Patient able to stick tongue midline  Post vital signs: Reviewed  Last Vitals:  Vitals Value Taken Time  BP 144/50 10/31/22 1116  Temp 97.4   Pulse 53 10/31/22 1117  Resp 18 10/31/22 1117  SpO2 95 % 10/31/22 1117  Vitals shown include unvalidated device data.  Last Pain:  Vitals:   10/31/22 0828  PainSc: 0-No pain         Complications: No notable events documented.

## 2022-10-31 NOTE — Anesthesia Procedure Notes (Signed)

## 2022-10-31 NOTE — TOC Initial Note (Signed)
Transition of Care [facility]) - Initial/Assessment Note    Patient Details  Name: Cassandra Rios MRN: [id number] Date of Birth: Jun 06, 1950  [facility]) CM/SW Contact:    Lawerance Sabal, RN Phone Number: 10/31/2022, 2:53 PM  Clinical Narrative:                  Patient admitted from home for AAA repair.  Anticipate DC to home when stable, TOC will follow for DC needs.   Expected Discharge Plan: Home/Self Care Barriers to Discharge: Continued Medical Work up   Patient Goals and CMS Choice Patient states their goals for this hospitalization and ongoing recovery are:: to go home          Expected Discharge Plan and Services   Discharge Planning Services: CM Consult   Living arrangements for the past 2 months: Single Family Home                                      Prior Living Arrangements/Services Living arrangements for the past 2 months: Single Family Home Lives with:: Spouse              Current home services: DME (walker cane and shower chair)    Activities of Daily Living      Permission Sought/Granted                  Emotional Assessment              Admission diagnosis:  AAA (abdominal aortic aneurysm) (HCC) [I71.40] Patient Active Problem List   Diagnosis Date Noted   Hypokalemia 02/14/2022   Class 1 obesity 02/14/2022   Acute gastroenteritis 02/14/2022   Tremor 01/02/2022   (HFpEF) heart failure with preserved ejection fraction (HCC) 11/24/2021   Gastrointestinal hemorrhage associated with gastric ulcer 10/25/2021   UGI bleed 10/17/2021   Misadventure of surgical procedure 09/28/2021   COVID-19 02/22/2021   COPD (chronic obstructive pulmonary disease) (HCC)    Lumbar spondylosis 11/05/2020   Lumbar pain 09/14/2020   Urinary incontinence, mixed 07/07/2020   Chronic midline low back pain without sciatica 04/20/2020   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/25/2020   Abdominal aortic atherosclerosis (HCC)  03/09/2020   Chronic back pain 03/09/2020   Cellulitis of breast 02/03/2020   Flank pain 02/03/2020   CAD (coronary artery disease), native coronary artery 07/09/2019   DOE (dyspnea on exertion) 04/21/2019   Preventative health care 04/21/2019   AAA (abdominal aortic aneurysm) (HCC) 04/19/2018   Chronic headaches 04/19/2018   Lichen sclerosus et atrophicus 03/31/2018   Sinus headache 03/18/2018   Closed fracture of right distal fibula 12/29/2017   Ankle fracture 12/29/2017   Osteoarthritis of acromioclavicular joint 11/05/2017   Impingement syndrome of left shoulder region 11/05/2017   Hyperlipidemia LDL goal <70 09/27/2017   Senile purpura (HCC) 03/29/2017   Lichen sclerosus of female genitalia 10/23/2016   Breast pain, left 03/27/2016   Chest pain 03/27/2016   Essential hypertension 09/24/2015   Sinusitis, acute 03/23/2015   Depression 07/06/2014   Thoracic back pain 07/06/2014   UTI (urinary tract infection) 04/28/2014   Cellulitis and abscess of trunk 02/16/2014   Class 2 obesity due to excess calories with body mass index (BMI) of 37.0 to 37.9 in adult 06/30/2013   Breast mass, right 01/10/2013   Acute sinusitis 03/22/2009   MULTIPLE SCLEROSIS, RELAPSING/REMITTING 01/04/2009   THROMBOCYTOPENIA 09/15/2008  SHOULDER PAIN, LEFT 09/15/2008   BARIATRIC SURGERY STATUS 09/15/2008   Hypothyroidism 07/20/2008   HIATAL HERNIA 07/20/2008   COPD with acute bronchitis (HCC) 07/23/2007   ABNORMAL PULMONARY TEST RESULTS 07/23/2007   Pulmonary function studies abnormal 07/23/2007   Allergic rhinitis 06/26/2007   HOARSENESS, CHRONIC 06/26/2007   WHEEZING 06/26/2007   SNORING 06/26/2007   COUGH 06/26/2007   TOBACCO ABUSE-HISTORY OF 06/26/2007   COLONIC POLYPS, HYPERPLASTIC 06/25/2007   Hyperlipidemia 06/25/2007   Depression with anxiety 06/25/2007   GASTRIC ULCER 06/25/2007   COLONIC POLYPS, ADENOMATOUS, HX OF 06/25/2007   Anxiety 06/25/2007   PCP:  [name],  DO Pharmacy:   [facility] [address] [place] - [address] [place] [address] Phone: [phone] Fax: [phone]  [id number] - [place], [place] - [address] AT [facility] [address] [place] [address] Phone: [phone] Fax: [phone]  [facility] [place] [place] [facility] [address] [place] [address] Phone: [phone] Fax: [phone]  [name] [place], [place] - [address] [place] [address] Phone: [phone] Fax: [phone]     Social Determinants of Health (SDOH) Social History: SDOH Screenings   Food Insecurity: No Food Insecurity (02/14/2022)  Housing: Low Risk  (02/14/2022)  Transportation Needs: No Transportation Needs (02/14/2022)  Utilities: Not At Risk (02/14/2022)  Depression (PHQ2-9): Low Risk  (05/19/2022)  Physical Activity: Inactive (04/19/2018)  Stress: No Stress Concern Present (04/19/2018)  Tobacco Use: Medium Risk (10/31/2022)   SDOH Interventions:     Readmission Risk Interventions     No data to display

## 2022-10-31 NOTE — Anesthesia Procedure Notes (Signed)
Arterial Line Insertion Start/End5/28/2024 9:40 AM, 10/31/2022 9:43 AM Performed by: Eilene Ghazi, MD, anesthesiologist  Patient location: OR. Preanesthetic checklist: patient identified, IV checked, site marked, risks and benefits discussed, surgical consent, monitors and equipment checked, pre-op evaluation, timeout performed and anesthesia consent Patient sedated Left, radial was placed Catheter size: 20 G Hand hygiene performed  and maximum sterile barriers used  Allen's test indicative of satisfactory collateral circulation Attempts: 2 Procedure performed using ultrasound guided technique. Ultrasound Notes:anatomy identified, needle tip was noted to be adjacent to the nerve/plexus identified and no ultrasound evidence of intravascular and/or intraneural injection Following insertion, Biopatch. Post procedure assessment: unchanged  Patient tolerated the procedure well with no immediate complications.

## 2022-10-31 NOTE — Plan of Care (Signed)
  Problem: Education: Goal: Knowledge of General Education information will improve Description: Including pain rating scale, medication(s)/side effects and non-pharmacologic comfort measures Outcome: Progressing   Problem: Health Behavior/Discharge Planning: Goal: Ability to manage health-related needs will improve Outcome: Progressing   Problem: Clinical Measurements: Goal: Respiratory complications will improve Outcome: Progressing   Problem: Nutrition: Goal: Adequate nutrition will be maintained Outcome: Progressing   Problem: Skin Integrity: Goal: Risk for impaired skin integrity will decrease Outcome: Progressing   Problem: Education: Goal: Knowledge of discharge needs will improve Outcome: Progressing   Problem: Clinical Measurements: Goal: Postoperative complications will be avoided or minimized Outcome: Progressing   Problem: Coping: Goal: Level of anxiety will decrease Outcome: Not Progressing

## 2022-10-31 NOTE — Discharge Instructions (Signed)
  [facility]   Discharge Instructions  Endovascular Aortic Aneurysm Repair  Please refer to the following instructions for your post-procedure care. Your surgeon or Physician Assistant will discuss any changes with you.  Activity  You are encouraged to walk as much as you can. You can slowly return to normal activities but must avoid strenuous activity and heavy lifting until your doctor tells you it's OK. Avoid activities such as vacuuming or swinging a gold club. It is normal to feel tired for several weeks after your surgery. Do not drive until your doctor gives the OK and you are no longer taking prescription pain medications. It is also normal to have difficulty with sleep habits, eating, and bowel movements after surgery. These will go away with time.  Bathing/Showering  Shower daily after you go home.  Do not soak in a bathtub, hot tub, or swim until the incision heals completely.  If you have incisions in your groin, wash the groin wounds with soap and water daily and pat dry. (No tub bath-only shower)  Then put a dry gauze or washcloth there to keep this area dry to help prevent wound infection daily and as needed.  Do not use Vaseline or neosporin on your incisions.  Only use soap and water on your incisions and then protect and keep dry.  Incision Care  Shower every day. Clean your incision with mild soap and water. Pat the area dry with a clean towel. You do not need a bandage unless otherwise instructed. Do not apply any ointments or creams to your incision. If you clothing is irritating, you may cover your incision with a dry gauze pad.  Diet  Resume your normal diet. There are no special food restrictions following this procedure. A low fat/low cholesterol diet is recommended for all patients with vascular disease. In order to heal from your surgery, it is CRITICAL to get adequate nutrition. Your body requires vitamins, minerals, and protein.  Vegetables are the best source of vitamins and minerals. Vegetables also provide the perfect balance of protein. Processed food has little nutritional value, so try to avoid this.  Medications  Resume taking all of your medications unless your doctor or nurse practitioner tells you not to. If your incision is causing pain, you may take over-the-counter pain relievers such as acetaminophen (Tylenol). If you were prescribed a stronger pain medication, please be aware these medications can cause nausea and constipation. Prevent nausea by taking the medication with a snack or meal. Avoid constipation by drinking plenty of fluids and eating foods with a high amount of fiber, such as fruits, vegetables, and grains.  Do not take Tylenol if you are taking prescription pain medications.   Follow up  Our office will schedule a follow-up appointment with a CT scan 3-4 weeks after your surgery.  Please call us immediately for any of the following conditions  Severe or worsening pain in your legs or feet or in your abdomen back or chest. Increased pain, redness, drainage (pus) from your incision site. Increased abdominal pain, bloating, nausea, vomiting or persistent diarrhea. Fever of 101 degrees or higher. Swelling in your leg (s),  Reduce your risk of vascular disease  Stop smoking. If you would like help call [facility] at 1-800-QUIT-NOW (1-800-784-8669) or [facility] at 336-586-4000. Manage your cholesterol Maintain a desired weight Control your diabetes Keep your blood pressure down  If you have questions, please call the office at [phone].  

## 2022-10-31 NOTE — H&P (Signed)
HPI:   Cassandra Rios is a 72 y.o. female history of abdominal aortic aneurysm with a sister that died from aneurysm surgery in the past and also had a father followed with abdominal arctic aneurysm.  She remains active walking without the walker and is recently lost weight in preparation for upcoming surgery.  She does not have any abdominal pain or back pain at this time and remains in her usual state of health.  She does not take any blood thinners.  Previously followed by Dr. Garnette Scheuermann with cardiology.       Past Medical History:  Diagnosis Date   AAA (abdominal aortic aneurysm)     Allergic rhinitis     Chronic hoarseness     Colon polyps     COPD (chronic obstructive pulmonary disease)      Dr. Marchelle Gearing   Dyslipidemia     Gastric bleed 10/2021    stomach ulcers   Gastritis     Hypertension     Hypothyroidism     MS (multiple sclerosis)     Obesity     Stomach ulcer from aspirin/ibuprofen-like drugs (NSAID's)           Family History  Problem Relation Age of Onset   Stroke Mother 61   Heart disease Mother          congenital heart defect?   COPD Father     Heart disease Father     AAA (abdominal aortic aneurysm) Father     COPD Sister     Arthritis Sister     AAA (abdominal aortic aneurysm) Sister     Dementia Sister     Other Sister          brain injury   Multiple sclerosis Neg Hx           Past Surgical History:  Procedure Laterality Date   ABDOMINAL HYSTERECTOMY       BARIATRIC SURGERY   09/07/08    lap band   CARPAL TUNNEL RELEASE       CATARACT EXTRACTION Right 11/08/2017   CATARACT EXTRACTION Left 11/22/2017   CATARACT EXTRACTION, BILATERAL         Toric lenses for astigmatism   CHOLECYSTECTOMY       CHOLECYSTECTOMY       ESOPHAGOGASTRODUODENOSCOPY (EGD) WITH PROPOFOL N/A 10/17/2021    Procedure: ESOPHAGOGASTRODUODENOSCOPY (EGD) WITH PROPOFOL;  Surgeon: Willis Modena, MD;  Location: [facility] ENDOSCOPY;  Service: Gastroenterology;  Laterality: N/A;    HIATAL HERNIA REPAIR   09/07/08   LEFT HEART CATH AND CORONARY ANGIOGRAPHY N/A 07/09/2019    Procedure: LEFT HEART CATH AND CORONARY ANGIOGRAPHY;  Surgeon: Lyn Records, MD;  Location: [facility] INVASIVE CV LAB;  Service: Cardiovascular;  Laterality: N/A;      Short Social History:  Social History         Tobacco Use   Smoking status: Former      Packs/day: 2.00      Years: 40.00      Additional pack years: 0.00      Total pack years: 80.00      Types: Cigarettes      Quit date: 04/05/2007      Years since quitting: 15.4      Passive exposure: Current (Husband)   Smokeless tobacco: Never  Substance Use Topics   Alcohol use: No           Allergies  Allergen Reactions   Codeine Itching and  Nausea And Vomiting   Hydromorphone Hcl Nausea And Vomiting   Ibuprofen Other (See Comments)      stomach ulcers   Morphine Sulfate        "makes my blood pressure bottom out"   Metronidazole Hives, Nausea And Vomiting and Rash            Current Outpatient Medications  Medication Sig Dispense Refill   acetaminophen (TYLENOL) 500 MG tablet Take 1,000 mg by mouth every 6 (six) hours as needed for moderate pain or headache.       atorvastatin (LIPITOR) 80 MG tablet Take 1 tablet (80 mg total) by mouth daily. 90 tablet 3   clobetasol ointment (TEMOVATE) 0.05 % Apply topically 2 (two) times daily.       FLUoxetine (PROZAC) 40 MG capsule Take 1 capsule (40 mg total) by mouth daily. 90 capsule 3   gabapentin (NEURONTIN) 300 MG capsule Take 600 mg by mouth every evening.        hydrochlorothiazide (HYDRODIURIL) 25 MG tablet TAKE 1 TABLET (25 MG TOTAL) BY MOUTH DAILY. 90 tablet 1   HYDROcodone-acetaminophen (NORCO/VICODIN) 5-325 MG tablet Take 1 tablet by mouth every 6 (six) hours as needed for moderate pain.       Interferon Beta-1b (BETASERON/EXTAVIA) 0.3 MG KIT injection Inject 0.25 mg into the skin every other day. 42 each 3   levothyroxine (SYNTHROID) 88 MCG tablet Take 1 tablet (88 mcg total)  by mouth daily. 30 tablet 2   losartan (COZAAR) 100 MG tablet TAKE 1 TABLET BY MOUTH EVERY DAY 90 tablet 1   meclizine (ANTIVERT) 12.5 MG tablet Take 1 tablet (12.5 mg total) by mouth 3 (three) times daily as needed for dizziness (vertigo). 90 tablet 6   mometasone (NASONEX) 50 MCG/ACT nasal spray Place 2 sprays into the nose daily as needed (allergies).       Polyethyl Glycol-Propyl Glycol (SYSTANE ULTRA OP) Place 1 drop into the right eye daily as needed (dry eye).        silver sulfADIAZINE (SILVADENE) 1 % cream Apply 1 application. topically daily as needed (irritated areas under abdomen).       triamcinolone ointment (KENALOG) 0.1 % Apply 1 application. topically daily as needed (rash).       Vitamin D, Ergocalciferol, (DRISDOL) 1.25 MG (50000 UNIT) CAPS capsule Take 1 capsule (50,000 Units total) by mouth every 7 (seven) days. 12 capsule 1    No current facility-administered medications for this visit.      Review of Systems  Constitutional:  Constitutional negative. HENT: HENT negative.  Eyes: Eyes negative.  Respiratory: Respiratory negative.  Cardiovascular: Cardiovascular negative.  GI: Gastrointestinal negative.  Musculoskeletal: Musculoskeletal negative.  Skin: Skin negative.  Neurological: Neurological negative. Hematologic: Hematologic/lymphatic negative.  Psychiatric: Psychiatric negative.          Objective:   Vitals:   10/31/22 0750  BP: (!) 174/82  Pulse: (!) 58  Resp: 18  Temp: 98.5 F (36.9 C)  SpO2: 99%       Physical Exam HENT:     Head: Normocephalic.     Nose: Nose normal.  Eyes:     Pupils: Pupils are equal, round, and reactive to light.  Cardiovascular:     Rate and Rhythm: Normal rate.  2+ bilateral DP Pulmonary:     Effort: Pulmonary effort is normal.  Abdominal:     General: Abdomen is flat.  Musculoskeletal:     Cervical back: Normal range of motion and neck supple.  Right lower leg: No edema.     Left lower leg: No edema.   Skin:    General: Skin is warm.     Capillary Refill: Capillary refill takes less than 2 seconds.  Neurological:     General: No focal deficit present.     Mental Status: She is alert.  Psychiatric:        Mood and Affect: Mood normal.        Behavior: Behavior normal.        Thought Content: Thought content normal.        Judgment: Judgment normal.        Data: CT IMPRESSION: VASCULAR   1. Enlarging infrarenal abdominal aortic aneurysm now measuring up to 5 cm in maximal diameter. Recommend follow-up every 6 months. Patient is already under the care of a vascular surgeon. This recommendation follows ACR consensus guidelines: White Paper of the ACR Incidental Findings Committee II on Vascular Findings. J Am Coll Radiol 2013; 10:789-794. 2. 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 3. Aortic and coronary artery atherosclerotic vascular calcifications.      Assessment/Plan:   72 year old female with now greater than 5 cm abdominal aortic aneurysm.  She has elected for endovascular repair and we again discussed risks, benefits and alternatives to surgery and will proceed today.         Calib Wadhwa C. Randie Heinz, MD [facility] Office: [phone] Pager: [phone]

## 2022-10-31 NOTE — Op Note (Addendum)
Patient name: Cassandra Rios MRN: [id number] DOB: 1950-11-02 Sex: female  10/31/2022 Pre-operative Diagnosis:  Abdominal aortic aneurysm Post-operative diagnosis:  Same Surgeon:  Apolinar Junes C. Randie Heinz, MD Assistant: Loel Dubonnet, PA Procedure Performed: 1.  Percutaneous access and closure bilateral common femoral arteries 2.  Endovascular aortic aneurysm repair with main body right 20 x 14 x 12 extended with 18 x 10 cm limb and contralateral left 14 x 12 cm limb  Indications: 72 year old female with history of abdominal aortic aneurysm which now meets requirement for repair measuring greater than 5 cm.  We have discussed the risk benefits and alternatives to proceeding with endovascular aneurysm repair and she demonstrates good understanding.  Experience assistant was necessary to facilitate percutaneous access and closure of the bilateral common femoral arteries as well as passage of endovascular catheters, balloons and stents.  Findings: The aortic aneurysm was readily identified with what appeared to be approximately 2 cm tapered neck.  The left renal was the lowest.  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  At completion initially there was a large type Ia endoleak that filled the aneurysm sac and possibly compression of the left limb of the graft.  After repeat ballooning of the neck of the aneurysm as well as the left limb there was no longer any stenosis in the limb filling both hypogastric arteries and the external neck arteries briskly there did appear to be maybe a very small left lateral type Ia endoleak but did not fill the aneurysm sac and with only 4 mm below the lowest renal artery I elected for no cuff to be placed although the future cuff could be placed or anchors could be used but likely the endoleak will seal with reversal of heparinization.   Procedure:  The patient was identified in the holding area and taken to  the operating room where she was placed supine operative table general anesthesia was induced.  She was sterilely prepped and draped in the abdomen bilateral groins in the usual fashion, antibiotics were administered and a timeout was called.  Ultrasound was used to identify bilateral common femoral arteries and these were percutaneously accessed with micropuncture needle followed by wire and sheath and then Bentson wires were placed and they were dilated with 8 French dilators and 2 percutaneous access devices were placed.  We initially placed a stiff wire on the right followed by 16 French sheath and a stiff wire on the left and a 12 French sheath was placed and the patient was fully heparinized.  The main body was placed at the top of L2 and cranial angulation was performed and pigtail catheter was placed in the left and aortogram was performed.  Main body was then deployed recaptured moved cephalad and redeployed.  The gate was then cannulated with a Kumpe catheter followed by Glidewire advantage followed by a pigtail catheter which was easily able to spin.  We performed angiogram which demonstrated patency of the bilateral renal arteries at that time.  We then performed retrograde angiography from the left common femoral sheath identified the left hypogastric artery and then extended the main the device on the left side with a 14 x 12 cm limb down to the bifurcation of the common iliac artery.  On the right side we then fully deployed the device perform retrograde angiography from the right and then extended with an 18 x 10 cm device.  The entirety of the graft  was then ironed out with MOB balloon.  Completion demonstrated possible type Ia endoleak on the left lateral side and stenosis of the left limb which were both read ballooned through the 12 French sheath on the left.  Completion angio after that demonstrated may be a very small type I endoleak which will likely resolve with reversal of heparinization the  limb no longer had any stenosis.  Satisfied with this we placed Bentson wires on both sides up with Pro-glide devices.  There were palpable pulses that were confirmed with Doppler at the feet and heparinization was reversed with 50 mg of protamine and the skin incisions were closed with 4-0 Monocryl and Dermabond.  Patient was then awakened from anesthesia having tolerated the procedure without any complication.  All counts were correct at completion.  EBL: 50 cc  Contrast: 75 cc     Cassandra Salce C. Randie Heinz, MD [facility] [place] Office: [phone] Pager: [phone]

## 2022-10-31 NOTE — Progress Notes (Signed)
Pt arrived from ...PACU.., A/ox ..4.pt denies any pain, MD aware,CCMD called. CHG bath given,no further needs at this time    

## 2022-11-01 LAB — BASIC METABOLIC PANEL
Anion gap: 10 (ref 5–15)
BUN: 13 mg/dL (ref 8–23)
CO2: 21 mmol/L — ABNORMAL LOW (ref 22–32)
Calcium: 8.4 mg/dL — ABNORMAL LOW (ref 8.9–10.3)
Chloride: 100 mmol/L (ref 98–111)
Creatinine, Ser: 1.43 mg/dL — ABNORMAL HIGH (ref 0.44–1.00)
GFR, Estimated: 39 mL/min — ABNORMAL LOW (ref >60)
Glucose, Bld: 136 mg/dL — ABNORMAL HIGH (ref 70–99)
Potassium: 3.9 mmol/L (ref 3.5–5.1)
Sodium: 131 mmol/L — ABNORMAL LOW (ref 135–145)

## 2022-11-01 LAB — CBC
HCT: 33.3 % — ABNORMAL LOW (ref 36.0–46.0)
Hemoglobin: 11.2 g/dL — ABNORMAL LOW (ref 12.0–15.0)
MCH: 29.9 pg (ref 26.0–34.0)
MCHC: 33.6 g/dL (ref 30.0–36.0)
MCV: 88.8 fL (ref 80.0–100.0)
Platelets: 101 10*3/uL — ABNORMAL LOW (ref 150–400)
RBC: 3.75 MIL/uL — ABNORMAL LOW (ref 3.87–5.11)
RDW: 12.3 % (ref 11.5–15.5)
WBC: 10.2 10*3/uL (ref 4.0–10.5)
nRBC: 0 % (ref 0.0–0.2)

## 2022-11-01 MED ORDER — SODIUM CHLORIDE 0.9 % IV BOLUS
500.0000 mL | Freq: Once | INTRAVENOUS | Status: AC
  Administered 2022-11-01: 500 mL via INTRAVENOUS

## 2022-11-01 NOTE — Progress Notes (Addendum)
Progress Note    11/01/2022 7:31 AM 1 Day Post-Op  Subjective:  No major complaints   Vitals:   10/31/22 2100 10/31/22 2300  BP:  139/70  Pulse: 64 67  Resp: 16 15  Temp:    SpO2: 98% 100%   Physical Exam: Cardiac:  regular Lungs:  non labored Incisions:  B groin incisions are clean, dry and intact without swelling or hematoma Extremities:  well perfused and warm with palpable DP pulses bilaterally Abdomen:  soft Neurologic: alert and oriented  CBC    Component Value Date/Time   WBC 10.2 11/01/2022 0244   RBC 3.75 (L) 11/01/2022 0244   HGB 11.2 (L) 11/01/2022 0244   HGB 14.9 01/11/2022 1008   HGB 14.3 07/07/2010 1530   HCT 33.3 (L) 11/01/2022 0244   HCT 43.8 01/11/2022 1008   HCT 42.1 07/07/2010 1530   PLT 101 (L) 11/01/2022 0244   PLT 139 (L) 01/11/2022 1008   MCV 88.8 11/01/2022 0244   MCV 86 01/11/2022 1008   MCV 89 07/07/2010 1530   MCH 29.9 11/01/2022 0244   MCHC 33.6 11/01/2022 0244   RDW 12.3 11/01/2022 0244   RDW 14.5 01/11/2022 1008   RDW 11.4 07/07/2010 1530   LYMPHSABS 1.5 05/19/2022 1010   LYMPHSABS 1.6 01/11/2022 1008   LYMPHSABS 2.6 07/07/2010 1530   MONOABS 0.6 05/19/2022 1010   EOSABS 0.1 05/19/2022 1010   EOSABS 0.1 01/11/2022 1008   EOSABS 0.2 07/07/2010 1530   BASOSABS 0.0 05/19/2022 1010   BASOSABS 0.0 01/11/2022 1008   BASOSABS 0.0 07/07/2010 1530    BMET    Component Value Date/Time   NA 131 (L) 11/01/2022 0244   NA 131 (L) 01/11/2022 1008   K 3.9 11/01/2022 0244   CL 100 11/01/2022 0244   CO2 21 (L) 11/01/2022 0244   GLUCOSE 136 (H) 11/01/2022 0244   BUN 13 11/01/2022 0244   BUN 12 01/11/2022 1008   CREATININE 1.43 (H) 11/01/2022 0244   CREATININE 1.04 05/13/2014 0840   CALCIUM 8.4 (L) 11/01/2022 0244   GFRNONAA 39 (L) 11/01/2022 0244   GFRNONAA 57 (L) 05/13/2014 0840   GFRAA 67 07/03/2019 1515   GFRAA 66 05/13/2014 0840    INR    Component Value Date/Time   INR 1.2 10/31/2022 1124     Intake/Output  Summary (Last 24 hours) at 11/01/2022 0731 Last data filed at 10/31/2022 2000 Gross per 24 hour  Intake 1207 ml  Output 305 ml  Net 902 ml     Assessment/Plan:  72 y.o. female is s/p 1.  Percutaneous access and closure bilateral common femoral arteries 2.  Endovascular aortic aneurysm repair with main body right 20 x 14 x 12 extended with 18 x 10 cm limb and contralateral left 14 x 12 cm limb  1 Day Post-Op   B groin access sites c/d/I without swelling or hematoma BLE well perfused and warm with palpable DP pulses Voiding without difficulty Scr 1.43 up from her baseline Ordered 500 cc bolus Encourage PO fluids Mobilize this morning She is stable for discharge home later today Follow up in 1 month with Dr. Randie Heinz with CTA arranged   Graceann Congress, PA-C [facility] [phone] 11/01/2022 7:31 AM   Patient was discharged prior to my evaluation but I connected with her via telephone.  I encouraged her to stay hydrated given her bump in creatinine and as long as she is making adequate urine she should be okay for  CT angio in 1 month and I will see her after this.  Areebah Meinders C. Randie Heinz, MD [facility] [place] Office: [phone] Pager: [phone]

## 2022-11-01 NOTE — Plan of Care (Signed)
  Problem: Education: Goal: Knowledge of General Education information will improve Description: Including pain rating scale, medication(s)/side effects and non-pharmacologic comfort measures Outcome: Adequate for Discharge   Problem: Health Behavior/Discharge Planning: Goal: Ability to manage health-related needs will improve Outcome: Adequate for Discharge   Problem: Clinical Measurements: Goal: Ability to maintain clinical measurements within normal limits will improve Outcome: Adequate for Discharge Goal: Will remain free from infection Outcome: Adequate for Discharge Goal: Diagnostic test results will improve Outcome: Adequate for Discharge Goal: Respiratory complications will improve Outcome: Adequate for Discharge Goal: Cardiovascular complication will be avoided Outcome: Adequate for Discharge   Problem: Elimination: Goal: Will not experience complications related to bowel motility Outcome: Adequate for Discharge Goal: Will not experience complications related to urinary retention Outcome: Adequate for Discharge   Problem: Pain Managment: Goal: General experience of comfort will improve Outcome: Adequate for Discharge

## 2022-11-02 ENCOUNTER — Telehealth: Payer: Self-pay | Admitting: *Deleted

## 2022-11-02 ENCOUNTER — Encounter: Payer: Self-pay | Admitting: *Deleted

## 2022-11-02 NOTE — Transitions of Care (Post Inpatient/ED Visit) (Signed)
11/02/2022  Name: Cassandra Rios MRN: [id number] DOB: [birth year]  Today's TOC FU Call Status: Today's TOC FU Call Status:: Successful TOC FU Call Competed TOC FU Call Complete Date: 11/02/22  Transition Care Management Follow-up Telephone Call Date of Discharge: 11/01/22 Discharge Facility: [facility]) Type of Discharge: Inpatient Admission Primary Inpatient Discharge Diagnosis:: AAA with surgical grafting How have you been since you were released from the hospital?: Better ("I am doing fine; no problems; pain under control, doing things for myself.  They did not tell me to see my PCP I don't feel like you need to schedule -- I have my regular appointment with her on 11/21/22 and will call if I need anything before that") Any questions or concerns?: No  Items Reviewed: Did you receive and understand the discharge instructions provided?: Yes (thoroughly reviewed with patient who verbalizes good understanding of same) Medications obtained,verified, and reconciled?: Yes (Medications Reviewed) (Full medication reconciliation/ review completed; no concerns or discrepancies identified; confirmed patient obtained/ is taking all newly Rx'd medications as instructed; self-manages medications and denies questions/ concerns around medications today) Any new allergies since your discharge?: No Dietary orders reviewed?: Yes Type of Diet Ordered:: Regular Do you have support at home?: Yes People in Home: spouse Name of Support/Comfort Primary Source: Reports independent in self-care activities; supportive spouse assists as/ if needed/ indicated  Medications Reviewed Today: Medications Reviewed Today     Reviewed by Michaela Corner, RN (Registered Nurse) on 11/02/22 at 1127  Med List Status: <None>   Medication Order Taking? Sig Documenting Provider Last Dose Status Informant  acetaminophen (TYLENOL) 500 MG tablet [id number] Yes Take 1,000 mg by mouth every 6 (six) hours as needed for  moderate pain or headache. [provider] Taking Active Self           Med Note Armandina Stammer Oct 18, 2022  3:26 PM)    atorvastatin (LIPITOR) 80 MG tablet [id number] Yes Take 1 tablet (80 mg total) by mouth daily. Gaston Islam., NP Taking Active   clobetasol ointment (TEMOVATE) 0.05 % [id number] Yes Apply 1 Application topically daily as needed (rash). [provider] Taking Active Self  FLUoxetine (PROZAC) 40 MG capsule [id number] Yes Take 1 capsule (40 mg total) by mouth daily. [name], DO Taking Active Self  gabapentin (NEURONTIN) 300 MG capsule [id number] Yes Take 600 mg by mouth every evening.  [provider] Taking Active Self  hydrochlorothiazide (HYDRODIURIL) 25 MG tablet [id number] Yes TAKE 1 TABLET (25 MG TOTAL) BY MOUTH DAILY. Bradd Canary, MD Taking Active Self  HYDROcodone-acetaminophen (NORCO/VICODIN) 5-325 MG tablet [id number] Yes Take 0.5 tablets by mouth every 6 (six) hours as needed for moderate pain. [provider] Taking Active Self  Interferon Beta-1b (BETASERON/EXTAVIA) 0.3 MG KIT injection [id number] Yes Inject 0.25 mg into the skin every other day. Butch Penny, NP Taking Active Self  levothyroxine (SYNTHROID) 88 MCG tablet [id number] Yes TAKE 1 TABLET BY MOUTH EVERY DAY Zola Button, Grayling Congress, DO Taking Active Self  losartan (COZAAR) 100 MG tablet [id number] Yes TAKE 1 TABLET BY MOUTH EVERY DAY Zola Button, Grayling Congress, DO Taking Active Self  meclizine (ANTIVERT) 12.5 MG tablet [id number] Yes Take 1 tablet (12.5 mg total) by mouth 3 (three) times daily as needed for dizziness (vertigo). Lyn Records, MD Taking Active Self  mometasone (NASONEX) 50 MCG/ACT nasal spray [id number] Yes Place 2 sprays into the nose daily as needed (allergies). [provider] Taking Active Self           Med Note Alphonzo Dublin   Wed Oct 18, 2022  3:26 PM)    pantoprazole (PROTONIX) 40 MG tablet [id number] Yes Take 40 mg by  mouth daily as needed (acid reflux). [provider] Taking Active Self           Med Note Jonnie Kind Nov 02, 2022 11:25 AM) 11/02/22- reports during TOC cal uses only as needed  Polyethyl Glycol-Propyl Glycol (SYSTANE ULTRA OP) [id number] Yes Place 1 drop into the right eye daily as needed (dry eye).  [provider] Taking Active Self  silver sulfADIAZINE (SILVADENE) 1 % cream [id number] Yes Apply 1 application. topically daily as needed (irritated areas under abdomen). [provider] Taking Active Self           Med Note Armandina Stammer Oct 18, 2022  3:26 PM)    traMADol (ULTRAM) 50 MG tablet [id number] Yes Take 50 mg by mouth every 12 (twelve) hours as needed (headache). [provider] Taking Active Self  triamcinolone ointment (KENALOG) 0.1 % [id number] Yes Apply 1 application  topically 2 (two) times daily. [provider] Taking Active Self           Med Note Armandina Stammer Oct 18, 2022  3:26 PM)    Vitamin D, Ergocalciferol, (DRISDOL) 1.25 MG (50000 UNIT) CAPS capsule [id number] No Take 1 capsule (50,000 Units total) by mouth every 7 (seven) days.  Patient not taking: Reported on 10/18/2022   [name], DO Not Taking Active Self            Home Care and Equipment/Supplies: Were Home Health Services Ordered?: No Any new equipment or medical supplies ordered?: No  Functional Questionnaire: Do you need assistance with bathing/showering or dressing?: No Do you need assistance with meal preparation?: No Do you need assistance with eating?: No Do you have difficulty maintaining continence: No Do you need assistance with getting out of bed/getting out of a chair/moving?: No Do you have difficulty managing or taking your medications?: No  Follow up appointments reviewed: PCP Follow-up appointment confirmed?: NA (verified not indicated per hospital discharging provider discharge notes) Specialist  Hospital Follow-up appointment confirmed?: No (verified recommended time frame for follow up per hospital discharging provider notes is 4 weeks- patient reports office is to call to schedule; confirms she has contact info for surgeon's office) Reason Specialist Follow-Up Not Confirmed: Patient has Specialist Provider Number and will Call for Appointment Do you need transportation to your follow-up appointment?: No Do you understand care options if your condition(s) worsen?: Yes-patient verbalized understanding  SDOH Interventions Today    Flowsheet Row Most Recent Value  SDOH Interventions   Food Insecurity Interventions Intervention Not Indicated  Transportation Interventions Intervention Not Indicated  [husband provides transportation]      TOC Interventions Today    Flowsheet Row Most Recent Value  TOC Interventions   TOC Interventions Discussed/Reviewed TOC Interventions Discussed, Post op wound/incision care, S/S of infection  [Patient declines need for ongoing/ further care coordination outreach,  no care coordination needs identified at time of TOC call today,  provided my direct contact information should questions/ concerns/ needs arise post-TOC call]      Interventions Today    Flowsheet Row Most Recent Value  Chronic Disease   Chronic disease during today's visit Other  [surgical AAA grafting]  General  Interventions   General Interventions Discussed/Reviewed General Interventions Discussed, Doctor Visits, Durable Medical Equipment (DME)  Doctor Visits Discussed/Reviewed Doctor Visits Discussed, Specialist, PCP  Durable Medical Equipment (DME) Walker  PCP/Specialist Visits Compliance with follow-up visit  Nutrition Interventions   Nutrition Discussed/Reviewed Nutrition Discussed  Pharmacy Interventions   Pharmacy Dicussed/Reviewed Pharmacy Topics Discussed  [Full medication review with updating medication list in EHR per patient report]  Safety Interventions   Safety  Discussed/Reviewed Safety Discussed, Fall Risk      Caryl Pina, RN, BSN, CCRN Alumnus RN CM Care Coordination/ Transition of Care- [facility] Care Management [phone]: direct office

## 2022-11-03 ENCOUNTER — Encounter (HOSPITAL_COMMUNITY): Payer: Self-pay | Admitting: Vascular Surgery

## 2022-11-03 DIAGNOSIS — F32A Depression, unspecified: Secondary | ICD-10-CM | POA: Diagnosis not present

## 2022-11-03 DIAGNOSIS — G35 Multiple sclerosis: Secondary | ICD-10-CM | POA: Diagnosis not present

## 2022-11-03 DIAGNOSIS — Z6832 Body mass index (BMI) 32.0-32.9, adult: Secondary | ICD-10-CM | POA: Diagnosis not present

## 2022-11-03 DIAGNOSIS — I714 Abdominal aortic aneurysm, without rupture, unspecified: Secondary | ICD-10-CM | POA: Diagnosis not present

## 2022-11-03 DIAGNOSIS — I5030 Unspecified diastolic (congestive) heart failure: Secondary | ICD-10-CM | POA: Diagnosis not present

## 2022-11-03 DIAGNOSIS — Z48812 Encounter for surgical aftercare following surgery on the circulatory system: Secondary | ICD-10-CM | POA: Diagnosis not present

## 2022-11-03 DIAGNOSIS — J449 Chronic obstructive pulmonary disease, unspecified: Secondary | ICD-10-CM | POA: Diagnosis not present

## 2022-11-03 DIAGNOSIS — I110 Hypertensive heart disease with heart failure: Secondary | ICD-10-CM | POA: Diagnosis not present

## 2022-11-03 DIAGNOSIS — E785 Hyperlipidemia, unspecified: Secondary | ICD-10-CM | POA: Diagnosis not present

## 2022-11-03 DIAGNOSIS — I2510 Atherosclerotic heart disease of native coronary artery without angina pectoris: Secondary | ICD-10-CM | POA: Diagnosis not present

## 2022-11-03 DIAGNOSIS — E663 Overweight: Secondary | ICD-10-CM | POA: Diagnosis not present

## 2022-11-03 NOTE — Discharge Summary (Signed)
[facility] Discharge Summary   Cassandra Rios [birth year] 72 y.o. female  MRN: [id number]  Admission Date: 10/31/2022  Discharge Date: 11/01/2022  Physician: Cassandra Harman, MD  Admission Diagnosis: AAA (abdominal aortic aneurysm) [facility]) [I71.40]   Hospital Course:  The patient was admitted to the hospital and taken to the operating room on 10/31/2022 and underwent: 1.  Percutaneous access and closure bilateral common femoral arteries 2.  Endovascular aortic aneurysm repair with main body right 20 x 14 x 12 extended with 18 x 10 cm limb and contralateral left 14 x 12 cm limb     The pt tolerated the procedure well and was transported to the PACU in good condition.   POD#1, bilateral groin access sites remained clean, dry and intact without swelling or hematoma. Bilateral legs well perfused and warm with palpable DP pulses. Voiding without difficulty. Slight increase in her creatinine from baseline. Given IV NS bolus. Encouraged po fluid intake.   The remainder of the hospital course consisted of increasing mobilization and increasing intake of solids without difficulty.  She remained stable for discharge home POD#1. She will resume all home medications as prescribed. She will follow up in 1 month with Dr. Randie Rios with CTA abdomen and Pelvis   CBC    Component Value Date/Time   WBC 10.2 11/01/2022 0244   RBC 3.75 (L) 11/01/2022 0244   HGB 11.2 (L) 11/01/2022 0244   HGB 14.9 01/11/2022 1008   HGB 14.3 07/07/2010 1530   HCT 33.3 (L) 11/01/2022 0244   HCT 43.8 01/11/2022 1008   HCT 42.1 07/07/2010 1530   PLT 101 (L) 11/01/2022 0244   PLT 139 (L) 01/11/2022 1008   MCV 88.8 11/01/2022 0244   MCV 86 01/11/2022 1008   MCV 89 07/07/2010 1530   MCH 29.9 11/01/2022 0244   MCHC 33.6 11/01/2022 0244   RDW 12.3 11/01/2022 0244   RDW 14.5 01/11/2022 1008   RDW 11.4 07/07/2010 1530   LYMPHSABS 1.5 05/19/2022 1010   LYMPHSABS 1.6 01/11/2022 1008   LYMPHSABS 2.6 07/07/2010 1530    MONOABS 0.6 05/19/2022 1010   EOSABS 0.1 05/19/2022 1010   EOSABS 0.1 01/11/2022 1008   EOSABS 0.2 07/07/2010 1530   BASOSABS 0.0 05/19/2022 1010   BASOSABS 0.0 01/11/2022 1008   BASOSABS 0.0 07/07/2010 1530    BMET    Component Value Date/Time   NA 131 (L) 11/01/2022 0244   NA 131 (L) 01/11/2022 1008   K 3.9 11/01/2022 0244   CL 100 11/01/2022 0244   CO2 21 (L) 11/01/2022 0244   GLUCOSE 136 (H) 11/01/2022 0244   BUN 13 11/01/2022 0244   BUN 12 01/11/2022 1008   CREATININE 1.43 (H) 11/01/2022 0244   CREATININE 1.04 05/13/2014 0840   CALCIUM 8.4 (L) 11/01/2022 0244   GFRNONAA 39 (L) 11/01/2022 0244   GFRNONAA 57 (L) 05/13/2014 0840   GFRAA 67 07/03/2019 1515   GFRAA 66 05/13/2014 0840       Discharge Instructions     ABDOMINAL PROCEDURE/ANEURYSM REPAIR/AORTO-BIFEMORAL BYPASS:  Call MD for increased abdominal pain; cramping diarrhea; nausea/vomiting   Complete by: As directed    Call MD for:  redness, tenderness, or signs of infection (pain, swelling, bleeding, redness, odor or green/yellow discharge around incision site)   Complete by: As directed    Call MD for:  severe or increased pain, loss or decreased feeling  in affected limb(s)   Complete by: As directed    Call MD for:  temperature >  100.5   Complete by: As directed    Discharge wound care:   Complete by: As directed    Shower as normal. Wash bilateral groins with mild soap and water, pat dry. Do not soak in bathtub, pool, etc   Driving Restrictions   Complete by: As directed    No driving for 3 days or while taking any narcotic pain medication   Increase activity slowly   Complete by: As directed    Walk with assistance use walker or cane as needed   Lifting restrictions   Complete by: As directed    No lifting for 2 weeks   May shower    Complete by: As directed    Resume previous diet   Complete by: As directed        Discharge Diagnosis:  AAA (abdominal aortic aneurysm) (HCC)  [I71.40]  Secondary Diagnosis: Patient Active Problem List   Diagnosis Date Noted   Hypokalemia 02/14/2022   Class 1 obesity 02/14/2022   Acute gastroenteritis 02/14/2022   Tremor 01/02/2022   (HFpEF) heart failure with preserved ejection fraction (HCC) 11/24/2021   Gastrointestinal hemorrhage associated with gastric ulcer 10/25/2021   UGI bleed 10/17/2021   Misadventure of surgical procedure 09/28/2021   COVID-19 02/22/2021   COPD (chronic obstructive pulmonary disease) (HCC)    Lumbar spondylosis 11/05/2020   Lumbar pain 09/14/2020   Urinary incontinence, mixed 07/07/2020   Chronic midline low back pain without sciatica 04/20/2020   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/25/2020   Abdominal aortic atherosclerosis (HCC) 03/09/2020   Chronic back pain 03/09/2020   Cellulitis of breast 02/03/2020   Flank pain 02/03/2020   CAD (coronary artery disease), native coronary artery 07/09/2019   DOE (dyspnea on exertion) 04/21/2019   Preventative health care 04/21/2019   AAA (abdominal aortic aneurysm) (HCC) 04/19/2018   Chronic headaches 04/19/2018   Lichen sclerosus et atrophicus 03/31/2018   Sinus headache 03/18/2018   Closed fracture of right distal fibula 12/29/2017   Ankle fracture 12/29/2017   Osteoarthritis of acromioclavicular joint 11/05/2017   Impingement syndrome of left shoulder region 11/05/2017   Hyperlipidemia LDL goal <70 09/27/2017   Senile purpura (HCC) 03/29/2017   Lichen sclerosus of female genitalia 10/23/2016   Breast pain, left 03/27/2016   Chest pain 03/27/2016   Essential hypertension 09/24/2015   Sinusitis, acute 03/23/2015   Depression 07/06/2014   Thoracic back pain 07/06/2014   UTI (urinary tract infection) 04/28/2014   Cellulitis and abscess of trunk 02/16/2014   Class 2 obesity due to excess calories with body mass index (BMI) of 37.0 to 37.9 in adult 06/30/2013   Breast mass, right 01/10/2013   Acute sinusitis 03/22/2009   MULTIPLE  SCLEROSIS, RELAPSING/REMITTING 01/04/2009   THROMBOCYTOPENIA 09/15/2008   SHOULDER PAIN, LEFT 09/15/2008   BARIATRIC SURGERY STATUS 09/15/2008   Hypothyroidism 07/20/2008   HIATAL HERNIA 07/20/2008   COPD with acute bronchitis (HCC) 07/23/2007   ABNORMAL PULMONARY TEST RESULTS 07/23/2007   Pulmonary function studies abnormal 07/23/2007   Allergic rhinitis 06/26/2007   HOARSENESS, CHRONIC 06/26/2007   WHEEZING 06/26/2007   SNORING 06/26/2007   COUGH 06/26/2007   TOBACCO ABUSE-HISTORY OF 06/26/2007   COLONIC POLYPS, HYPERPLASTIC 06/25/2007   Hyperlipidemia 06/25/2007   Depression with anxiety 06/25/2007   GASTRIC ULCER 06/25/2007   COLONIC POLYPS, ADENOMATOUS, HX OF 06/25/2007   Anxiety 06/25/2007   Past Medical History:  Diagnosis Date   AAA (abdominal aortic aneurysm) (HCC)    Allergic rhinitis    Arthritis  Chronic hoarseness    Colon polyps    COPD (chronic obstructive pulmonary disease) (HCC)    Dr. Marchelle Gearing   Dyslipidemia    Gastric bleed 10/2021   stomach ulcers   Gastritis    GERD (gastroesophageal reflux disease)    Hypertension    Hypothyroidism    MS (multiple sclerosis) (HCC)    Neuromuscular disorder (HCC)    Obesity    Stomach ulcer from aspirin/ibuprofen-like drugs (NSAID's)      Allergies as of 11/01/2022       Reactions   Codeine Itching, Nausea And Vomiting   Hydromorphone Hcl Nausea And Vomiting   Ibuprofen Other (See Comments)   stomach ulcers   Morphine Sulfate    "makes my blood pressure bottom out"   Metronidazole Hives, Nausea And Vomiting, Rash        Medication List     TAKE these medications    acetaminophen 500 MG tablet Commonly known as: TYLENOL Take 1,000 mg by mouth every 6 (six) hours as needed for moderate pain or headache.   atorvastatin 80 MG tablet Commonly known as: LIPITOR Take 1 tablet (80 mg total) by mouth daily.   clobetasol ointment 0.05 % Commonly known as: TEMOVATE Apply 1 Application topically  daily as needed (rash).   FLUoxetine 40 MG capsule Commonly known as: PROZAC Take 1 capsule (40 mg total) by mouth daily.   gabapentin 300 MG capsule Commonly known as: NEURONTIN Take 600 mg by mouth every evening.   hydrochlorothiazide 25 MG tablet Commonly known as: HYDRODIURIL TAKE 1 TABLET (25 MG TOTAL) BY MOUTH DAILY.   HYDROcodone-acetaminophen 5-325 MG tablet Commonly known as: NORCO/VICODIN Take 0.5 tablets by mouth every 6 (six) hours as needed for moderate pain.   Interferon Beta-1b 0.3 MG Kit injection Commonly known as: BETASERON/EXTAVIA Inject 0.25 mg into the skin every other day.   levothyroxine 88 MCG tablet Commonly known as: SYNTHROID TAKE 1 TABLET BY MOUTH EVERY DAY   losartan 100 MG tablet Commonly known as: COZAAR TAKE 1 TABLET BY MOUTH EVERY DAY   meclizine 12.5 MG tablet Commonly known as: ANTIVERT Take 1 tablet (12.5 mg total) by mouth 3 (three) times daily as needed for dizziness (vertigo).   mometasone 50 MCG/ACT nasal spray Commonly known as: NASONEX Place 2 sprays into the nose daily as needed (allergies).   pantoprazole 40 MG tablet Commonly known as: PROTONIX Take 40 mg by mouth daily as needed (acid reflux).   silver sulfADIAZINE 1 % cream Commonly known as: SILVADENE Apply 1 application. topically daily as needed (irritated areas under abdomen).   SYSTANE ULTRA OP Place 1 drop into the right eye daily as needed (dry eye).   traMADol 50 MG tablet Commonly known as: ULTRAM Take 50 mg by mouth every 12 (twelve) hours as needed (headache).   triamcinolone ointment 0.1 % Commonly known as: KENALOG Apply 1 application  topically 2 (two) times daily.   Vitamin D (Ergocalciferol) 1.25 MG (50000 UNIT) Caps capsule Commonly known as: DRISDOL Take 1 capsule (50,000 Units total) by mouth every 7 (seven) days.               Discharge Care Instructions  (From admission, onward)           Start     Ordered   11/01/22 0000   Discharge wound care:       Comments: Shower as normal. Wash bilateral groins with mild soap and water, pat dry. Do not soak in bathtub,  pool, etc   11/01/22 1051            Discharge Instructions:   [facility]  Discharge Instructions Endovascular Aortic Aneurysm Repair  Please refer to the following instructions for your post-procedure care. Your surgeon or Physician Assistant will discuss any changes with you.  Activity  You are encouraged to walk as much as you can. You can slowly return to normal activities but must avoid strenuous activity and heavy lifting until your doctor tells you it's OK. Avoid activities such as vacuuming or swinging a gold club. It is normal to feel tired for several weeks after your surgery. Do not drive until your doctor gives the OK and you are no longer taking prescription pain medications. It is also normal to have difficulty with sleep habits, eating, and bowel movements after surgery. These will go away with time.  Bathing/Showering  You may shower after you go home. If you have an incision, do not soak in a bathtub, hot tub, or swim until the incision heals completely.  Incision Care  Shower every day. Clean your incision with mild soap and water. Pat the area dry with a clean towel. You do not need a bandage unless otherwise instructed. Do not apply any ointments or creams to your incision. If you clothing is irritating, you may cover your incision with a dry gauze pad.  Diet  Resume your normal diet. There are no special food restrictions following this procedure. A low fat/low cholesterol diet is recommended for all patients with vascular disease. In order to heal from your surgery, it is CRITICAL to get adequate nutrition. Your body requires vitamins, minerals, and protein. Vegetables are the best source of vitamins and minerals. Vegetables also provide the perfect balance of protein. Processed food has little  nutritional value, so try to avoid this.  Medications  Resume taking all of your medications unless your doctor or Physician Assistnat tells you not to. If your incision is causing pain, you may take over-the-counter pain relievers such as acetaminophen (Tylenol). If you were prescribed a stronger pain medication, please be aware these medications can cause nausea and constipation. Prevent nausea by taking the medication with a snack or meal. Avoid constipation by drinking plenty of fluids and eating foods with a high amount of fiber, such as fruits, vegetables, and grains. Do not take Tylenol if you are taking prescription pain medications.   Follow up  Our office will schedule a follow-up appointment with a C.T. scan 3-4 weeks after your surgery.  Please call us immediately for any of the following conditions  Severe or worsening pain in your legs or feet or in your abdomen back or chest. Increased pain, redness, drainage (pus) from your incision sit. Increased abdominal pain, bloating, nausea, vomiting or persistent diarrhea. Fever of 101 degrees or higher. Swelling in your leg (s),  Reduce your risk of vascular disease  Stop smoking. If you would like help call [facility] at 1-800-QUIT-NOW ((512) 089-3246) or [facility] at (508)625-9095. Manage your cholesterol Maintain a desired weight Control your diabetes Keep your blood pressure down  If you have questions, please call the office at [phone].    Prescriptions given: None  Disposition: Home  Patient's condition: is Good  Follow up: 1. Dr. Randie Rios in 4 weeks with CTA protocol   Graceann Congress, PA-C [facility] [phone] 11/03/2022  8:57 AM   - For VQI Registry use - Post-op:  Time to Extubation: [X]  In  OR, [ ]  < 12 hrs, [ ]  12-24 hrs, [ ]  >=24 hrs Vasopressors Req. Post-op: No MI: No., [ ]  Troponin only, [ ]  EKG or Clinical New Arrhythmia: No CHF: No ICU Stay: 0 days Transfusion: No      If yes, 0 units given  Complications: Resp failure: No., [ ]  Pneumonia, [ ]  Ventilator Chg in renal function: Yes.  , [ X] Inc. Cr > 0.5, [ ]  Temp. Dialysis,  [ ]  Permanent dialysis Leg ischemia: No., no Surgery needed, [ ]  Yes, Surgery needed,  [ ]  Amputation Bowel ischemia: No., [ ]  Medical Rx, [ ]  Surgical Rx Wound complication: No., [ ]  Superficial separation/infection, [ ]  Return to OR Return to OR: No  Return to OR for bleeding: No Stroke: No., [ ]  Minor, [ ]  Major  Discharge medications: Statin use:  Yes  ASA use:  No  Plavix use:  No  Beta blocker use:  No  ARB use:  Yes ACEI use:  No CCB use:  No

## 2022-11-07 ENCOUNTER — Telehealth: Payer: Self-pay | Admitting: Family Medicine

## 2022-11-07 NOTE — Telephone Encounter (Signed)
The nurse from [facility] called and needed verbal order for a urine uralysis for patient as patient suspects have a UTI. Please call back [phone]

## 2022-11-08 NOTE — Telephone Encounter (Signed)
Orders have been given.  

## 2022-11-09 ENCOUNTER — Other Ambulatory Visit: Payer: Self-pay

## 2022-11-09 DIAGNOSIS — I7143 Infrarenal abdominal aortic aneurysm, without rupture: Secondary | ICD-10-CM

## 2022-11-09 NOTE — Addendum Note (Signed)
Addended by: Leilani Able, Ravenne Wayment A on: 11/09/2022 09:30 AM   Modules accepted: Orders

## 2022-11-10 NOTE — Telephone Encounter (Addendum)
[  11:03 AM] Delora Fuel from [employer] lost the order for MRN: [id number] so she just needs it read off to her by someone clinical pls. (lowne) pt    VO given to nurse Tonya over the phone ( nurse was also in the car driving with GPS going ) during the time of the call.

## 2022-11-15 ENCOUNTER — Telehealth: Payer: Self-pay

## 2022-11-15 NOTE — Telephone Encounter (Signed)
VO given to Cassandra Rios [facility] nurse to get UA/cultures, as pt was symptomatic and to add one week of HH. No further questions/concerns at this time.

## 2022-11-21 ENCOUNTER — Encounter: Payer: Self-pay | Admitting: Family Medicine

## 2022-11-21 ENCOUNTER — Ambulatory Visit (INDEPENDENT_AMBULATORY_CARE_PROVIDER_SITE_OTHER): Payer: PPO | Admitting: Family Medicine

## 2022-11-21 VITALS — BP 124/70 | HR 71 | Temp 98.1°F | Resp 18 | Ht 66.0 in | Wt 198.4 lb

## 2022-11-21 DIAGNOSIS — R350 Frequency of micturition: Secondary | ICD-10-CM

## 2022-11-21 DIAGNOSIS — Z0000 Encounter for general adult medical examination without abnormal findings: Secondary | ICD-10-CM

## 2022-11-21 DIAGNOSIS — F419 Anxiety disorder, unspecified: Secondary | ICD-10-CM

## 2022-11-21 DIAGNOSIS — E039 Hypothyroidism, unspecified: Secondary | ICD-10-CM | POA: Diagnosis not present

## 2022-11-21 DIAGNOSIS — E785 Hyperlipidemia, unspecified: Secondary | ICD-10-CM | POA: Diagnosis not present

## 2022-11-21 DIAGNOSIS — L900 Lichen sclerosus et atrophicus: Secondary | ICD-10-CM

## 2022-11-21 DIAGNOSIS — I10 Essential (primary) hypertension: Secondary | ICD-10-CM | POA: Diagnosis not present

## 2022-11-21 DIAGNOSIS — I2510 Atherosclerotic heart disease of native coronary artery without angina pectoris: Secondary | ICD-10-CM

## 2022-11-21 DIAGNOSIS — E559 Vitamin D deficiency, unspecified: Secondary | ICD-10-CM | POA: Diagnosis not present

## 2022-11-21 DIAGNOSIS — R82998 Other abnormal findings in urine: Secondary | ICD-10-CM

## 2022-11-21 DIAGNOSIS — D696 Thrombocytopenia, unspecified: Secondary | ICD-10-CM | POA: Diagnosis not present

## 2022-11-21 LAB — COMPREHENSIVE METABOLIC PANEL
ALT: 14 U/L (ref 0–35)
AST: 36 U/L (ref 0–37)
Albumin: 3.7 g/dL (ref 3.5–5.2)
Alkaline Phosphatase: 88 U/L (ref 39–117)
BUN: 11 mg/dL (ref 6–23)
CO2: 24 mEq/L (ref 19–32)
Calcium: 9.2 mg/dL (ref 8.4–10.5)
Chloride: 94 mEq/L — ABNORMAL LOW (ref 96–112)
Creatinine, Ser: 0.97 mg/dL (ref 0.40–1.20)
GFR: 58.26 mL/min — ABNORMAL LOW (ref >60.00)
Glucose, Bld: 113 mg/dL — ABNORMAL HIGH (ref 70–99)
Potassium: 3.6 mEq/L (ref 3.5–5.1)
Sodium: 130 mEq/L — ABNORMAL LOW (ref 135–145)
Total Bilirubin: 0.7 mg/dL (ref 0.2–1.2)
Total Protein: 6.7 g/dL (ref 6.0–8.3)

## 2022-11-21 LAB — CBC WITH DIFFERENTIAL/PLATELET
Basophils Absolute: 0 10*3/uL (ref 0.0–0.1)
Basophils Relative: 0.4 % (ref 0.0–3.0)
Eosinophils Absolute: 0.1 10*3/uL (ref 0.0–0.7)
Eosinophils Relative: 1.3 % (ref 0.0–5.0)
HCT: 37.3 % (ref 36.0–46.0)
Hemoglobin: 12.2 g/dL (ref 12.0–15.0)
Lymphocytes Relative: 18.7 % (ref 12.0–46.0)
Lymphs Abs: 1.2 10*3/uL (ref 0.7–4.0)
MCHC: 32.8 g/dL (ref 30.0–36.0)
MCV: 87.6 fl (ref 78.0–100.0)
Monocytes Absolute: 0.5 10*3/uL (ref 0.1–1.0)
Monocytes Relative: 7.7 % (ref 3.0–12.0)
Neutro Abs: 4.5 10*3/uL (ref 1.4–7.7)
Neutrophils Relative %: 71.9 % (ref 43.0–77.0)
Platelets: 185 10*3/uL (ref 150.0–400.0)
RBC: 4.26 Mil/uL (ref 3.87–5.11)
RDW: 13.8 % (ref 11.5–15.5)
WBC: 6.3 10*3/uL (ref 4.0–10.5)

## 2022-11-21 LAB — POC URINALSYSI DIPSTICK (AUTOMATED)
Bilirubin, UA: NEGATIVE
Blood, UA: NEGATIVE
Glucose, UA: NEGATIVE
Ketones, UA: NEGATIVE
Nitrite, UA: NEGATIVE
Protein, UA: NEGATIVE
Spec Grav, UA: <=1.005 — AB (ref 1.010–1.025)
Urobilinogen, UA: 0.2 E.U./dL
pH, UA: 7 (ref 5.0–8.0)

## 2022-11-21 LAB — VITAMIN D 25 HYDROXY (VIT D DEFICIENCY, FRACTURES): VITD: 44.36 ng/mL (ref 30.00–100.00)

## 2022-11-21 LAB — LIPID PANEL
Cholesterol: 119 mg/dL (ref 0–200)
HDL: 45.8 mg/dL (ref >39.00)
LDL Cholesterol: 54 mg/dL (ref 0–99)
NonHDL: 72.96
Total CHOL/HDL Ratio: 3
Triglycerides: 94 mg/dL (ref 0.0–149.0)
VLDL: 18.8 mg/dL (ref 0.0–40.0)

## 2022-11-21 LAB — TSH: TSH: 2.07 u[IU]/mL (ref 0.35–5.50)

## 2022-11-21 MED ORDER — LEVOTHYROXINE SODIUM 88 MCG PO TABS: 88.0000 ug | ORAL_TABLET | Freq: Every day | ORAL | 0 refills | Status: DC

## 2022-11-21 MED ORDER — VITAMIN D (ERGOCALCIFEROL) 1.25 MG (50000 UNIT) PO CAPS: 50000.0000 [IU] | ORAL_CAPSULE | ORAL | 1 refills | Status: DC

## 2022-11-21 MED ORDER — LOSARTAN POTASSIUM 100 MG PO TABS: 100.0000 mg | ORAL_TABLET | Freq: Every day | ORAL | 1 refills | Status: DC

## 2022-11-21 MED ORDER — FLUOXETINE HCL 40 MG PO CAPS: 40.0000 mg | ORAL_CAPSULE | Freq: Every day | ORAL | 3 refills | Status: DC

## 2022-11-21 MED ORDER — HYDROCHLOROTHIAZIDE 25 MG PO TABS: 25.0000 mg | ORAL_TABLET | Freq: Every day | ORAL | 1 refills | Status: DC

## 2022-11-21 NOTE — Assessment & Plan Note (Signed)
Con't prozac  Stable

## 2022-11-21 NOTE — Addendum Note (Signed)
Addended by: Mervin Kung A on: 11/21/2022 03:40 PM   Modules accepted: Orders

## 2022-11-21 NOTE — Assessment & Plan Note (Signed)
Well controlled, no changes to meds. Encouraged heart healthy diet such as the DASH diet and exercise as tolerated.  °

## 2022-11-21 NOTE — Patient Instructions (Signed)
Preventive Care 65 Years and Older, Female Preventive care refers to lifestyle choices and visits with your health care provider that can promote health and wellness. Preventive care visits are also called wellness exams. What can I expect for my preventive care visit? Counseling Your health care provider may ask you questions about your: Medical history, including: Past medical problems. Family medical history. Pregnancy and menstrual history. History of falls. Current health, including: Memory and ability to understand (cognition). Emotional well-being. Home life and relationship well-being. Sexual activity and sexual health. Lifestyle, including: Alcohol, nicotine or tobacco, and drug use. Access to firearms. Diet, exercise, and sleep habits. Work and work environment. Sunscreen use. Safety issues such as seatbelt and bike helmet use. Physical exam Your health care provider will check your: Height and weight. These may be used to calculate your BMI (body mass index). BMI is a measurement that tells if you are at a healthy weight. Waist circumference. This measures the distance around your waistline. This measurement also tells if you are at a healthy weight and may help predict your risk of certain diseases, such as type 2 diabetes and high blood pressure. Heart rate and blood pressure. Body temperature. Skin for abnormal spots. What immunizations do I need?  Vaccines are usually given at various ages, according to a schedule. Your health care provider will recommend vaccines for you based on your age, medical history, and lifestyle or other factors, such as travel or where you work. What tests do I need? Screening Your health care provider may recommend screening tests for certain conditions. This may include: Lipid and cholesterol levels. Hepatitis C test. Hepatitis B test. HIV (human immunodeficiency virus) test. STI (sexually transmitted infection) testing, if you are at  risk. Lung cancer screening. Colorectal cancer screening. Diabetes screening. This is done by checking your blood sugar (glucose) after you have not eaten for a while (fasting). Mammogram. Talk with your health care provider about how often you should have regular mammograms. BRCA-related cancer screening. This may be done if you have a family history of breast, ovarian, tubal, or peritoneal cancers. Bone density scan. This is done to screen for osteoporosis. Talk with your health care provider about your test results, treatment options, and if necessary, the need for more tests. Follow these instructions at home: Eating and drinking  Eat a diet that includes fresh fruits and vegetables, whole grains, lean protein, and low-fat dairy products. Limit your intake of foods with high amounts of sugar, saturated fats, and salt. Take vitamin and mineral supplements as recommended by your health care provider. Do not drink alcohol if your health care provider tells you not to drink. If you drink alcohol: Limit how much you have to 0-1 drink a day. Know how much alcohol is in your drink. In the [place] one drink equals one 12 oz bottle of beer (355 mL), one 5 oz glass of wine (148 mL), or one 1 oz glass of hard liquor (44 mL). Lifestyle Brush your teeth every morning and night with fluoride toothpaste. Floss one time each day. Exercise for at least 30 minutes 5 or more days each week. Do not use any products that contain nicotine or tobacco. These products include cigarettes, chewing tobacco, and vaping devices, such as e-cigarettes. If you need help quitting, ask your health care provider. Do not use drugs. If you are sexually active, practice safe sex. Use a condom or other form of protection in order to prevent STIs. Take aspirin only as told by   your health care provider. Make sure that you understand how much to take and what form to take. Work with your health care provider to find out whether it  is safe and beneficial for you to take aspirin daily. Ask your health care provider if you need to take a cholesterol-lowering medicine (statin). Find healthy ways to manage stress, such as: Meditation, yoga, or listening to music. Journaling. Talking to a trusted person. Spending time with friends and family. Minimize exposure to UV radiation to reduce your risk of skin cancer. Safety Always wear your seat belt while driving or riding in a vehicle. Do not drive: If you have been drinking alcohol. Do not ride with someone who has been drinking. When you are tired or distracted. While texting. If you have been using any mind-altering substances or drugs. Wear a helmet and other protective equipment during sports activities. If you have firearms in your house, make sure you follow all gun safety procedures. What's next? Visit your health care provider once a year for an annual wellness visit. Ask your health care provider how often you should have your eyes and teeth checked. Stay up to date on all vaccines. This information is not intended to replace advice given to you by your health care provider. Make sure you discuss any questions you have with your health care provider. Document Revised: 11/17/2020 Document Reviewed: 11/17/2020 Elsevier Patient Education  2024 Elsevier Inc.  

## 2022-11-21 NOTE — Assessment & Plan Note (Signed)
Encourage heart healthy diet such as MIND or DASH diet, increase exercise, avoid trans fats, simple carbohydrates and processed foods, consider a krill or fish or flaxseed oil cap daily.  °

## 2022-11-21 NOTE — Progress Notes (Signed)
Established Patient Office Visit  Subjective   Patient ID: Cassandra Rios, female    DOB: 1950/10/14  Age: 72 y.o. MRN: [id number]  Chief Complaint  Patient presents with   Annual Exam    Pt states fasting     HPI Discussed the use of AI scribe software for clinical note transcription with the patient, who gave verbal consent to proceed.  History of Present Illness   The patient, with a history of aneurysm, frequent urination, and sclerosis, presents for a physical exam. She recently underwent aneurysm surgery three weeks ago and is currently in her last week of physical therapy. She reports feeling much better since the surgery and is recovering well. She also mentions that she has not been taking her vitamin D supplement since the surgery but still feels good.  The patient also reports frequent urination and noticed some dry blood in her vaginal area after wiping, which she attributes to the catheter used during surgery or the scraping from sclerosis. She plans to discuss this with her gynecologist at her next visit.  The patient also mentions that she has not been taking the shingles vaccine and is overdue for a tetanus shot. She plans to get the tetanus shot at the pharmacy. She regularly sees an eye doctor due to a bleed in one eye and has a follow-up appointment with a retina specialist. She also has a six-month follow-up appointment with her cardiologist.      Patient Active Problem List   Diagnosis Date Noted   Hypokalemia 02/14/2022   Class 1 obesity 02/14/2022   Acute gastroenteritis 02/14/2022   Tremor 01/02/2022   (HFpEF) heart failure with preserved ejection fraction (HCC) 11/24/2021   Gastrointestinal hemorrhage associated with gastric ulcer 10/25/2021   UGI bleed 10/17/2021   Misadventure of surgical procedure 09/28/2021   COVID-19 02/22/2021   COPD (chronic obstructive pulmonary disease) (HCC)    Lumbar spondylosis 11/05/2020   Lumbar pain 09/14/2020    Urinary incontinence, mixed 07/07/2020   Chronic midline low back pain without sciatica 04/20/2020   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/25/2020   Abdominal aortic atherosclerosis (HCC) 03/09/2020   Chronic back pain 03/09/2020   Cellulitis of breast 02/03/2020   Flank pain 02/03/2020   CAD (coronary artery disease), native coronary artery 07/09/2019   DOE (dyspnea on exertion) 04/21/2019   Preventative health care 04/21/2019   AAA (abdominal aortic aneurysm) (HCC) 04/19/2018   Chronic headaches 04/19/2018   Lichen sclerosus et atrophicus 03/31/2018   Sinus headache 03/18/2018   Closed fracture of right distal fibula 12/29/2017   Ankle fracture 12/29/2017   Osteoarthritis of acromioclavicular joint 11/05/2017   Impingement syndrome of left shoulder region 11/05/2017   Hyperlipidemia LDL goal <70 09/27/2017   Senile purpura (HCC) 03/29/2017   Lichen sclerosus of female genitalia 10/23/2016   Breast pain, left 03/27/2016   Chest pain 03/27/2016   Essential hypertension 09/24/2015   Sinusitis, acute 03/23/2015   Depression 07/06/2014   Thoracic back pain 07/06/2014   UTI (urinary tract infection) 04/28/2014   Cellulitis and abscess of trunk 02/16/2014   Class 2 obesity due to excess calories with body mass index (BMI) of 37.0 to 37.9 in adult 06/30/2013   Breast mass, right 01/10/2013   Acute sinusitis 03/22/2009   MULTIPLE SCLEROSIS, RELAPSING/REMITTING 01/04/2009   THROMBOCYTOPENIA 09/15/2008   SHOULDER PAIN, LEFT 09/15/2008   BARIATRIC SURGERY STATUS 09/15/2008   Hypothyroidism 07/20/2008   HIATAL HERNIA 07/20/2008   COPD with acute bronchitis (  HCC) 07/23/2007   ABNORMAL PULMONARY TEST RESULTS 07/23/2007   Pulmonary function studies abnormal 07/23/2007   Allergic rhinitis 06/26/2007   HOARSENESS, CHRONIC 06/26/2007   WHEEZING 06/26/2007   SNORING 06/26/2007   COUGH 06/26/2007   TOBACCO ABUSE-HISTORY OF 06/26/2007   COLONIC POLYPS, HYPERPLASTIC 06/25/2007    Hyperlipidemia 06/25/2007   Depression with anxiety 06/25/2007   GASTRIC ULCER 06/25/2007   COLONIC POLYPS, ADENOMATOUS, HX OF 06/25/2007   Anxiety 06/25/2007   Past Medical History:  Diagnosis Date   AAA (abdominal aortic aneurysm) (HCC)    Allergic rhinitis    Arthritis    Chronic hoarseness    Colon polyps    COPD (chronic obstructive pulmonary disease) (HCC)    Dr. Marchelle Gearing   Dyslipidemia    Gastric bleed 10/2021   stomach ulcers   Gastritis    GERD (gastroesophageal reflux disease)    Hypertension    Hypothyroidism    MS (multiple sclerosis) (HCC)    Neuromuscular disorder (HCC)    Obesity    Stomach ulcer from aspirin/ibuprofen-like drugs (NSAID's)    Past Surgical History:  Procedure Laterality Date   ABDOMINAL AORTIC ENDOVASCULAR STENT GRAFT N/A 10/31/2022   Procedure: ABDOMINAL AORTIC ENDOVASCULAR STENT GRAFT;  Surgeon: Maeola Harman, MD;  Location: [facility] OR;  Service: Vascular;  Laterality: N/A;   ABDOMINAL HYSTERECTOMY     BARIATRIC SURGERY  09/07/2008   lap band   CARPAL TUNNEL RELEASE     CATARACT EXTRACTION Right 11/08/2017   CATARACT EXTRACTION Left 11/22/2017   CATARACT EXTRACTION, BILATERAL      Toric lenses for astigmatism   CHOLECYSTECTOMY     CHOLECYSTECTOMY     ESOPHAGOGASTRODUODENOSCOPY (EGD) WITH PROPOFOL N/A 10/17/2021   Procedure: ESOPHAGOGASTRODUODENOSCOPY (EGD) WITH PROPOFOL;  Surgeon: Willis Modena, MD;  Location: [facility] ENDOSCOPY;  Service: Gastroenterology;  Laterality: N/A;   HIATAL HERNIA REPAIR  09/07/2008   LEFT HEART CATH AND CORONARY ANGIOGRAPHY N/A 07/09/2019   Procedure: LEFT HEART CATH AND CORONARY ANGIOGRAPHY;  Surgeon: Lyn Records, MD;  Location: [facility] INVASIVE CV LAB;  Service: Cardiovascular;  Laterality: N/A;   TONSILLECTOMY     ULTRASOUND GUIDANCE FOR VASCULAR ACCESS Bilateral 10/31/2022   Procedure: ULTRASOUND GUIDANCE FOR VASCULAR ACCESS;  Surgeon: Maeola Harman, MD;  Location: [facility] OR;  Service: Vascular;   Laterality: Bilateral;   Social History   Tobacco Use   Smoking status: Former    Packs/day: 2.00    Years: 40.00    Additional pack years: 0.00    Total pack years: 80.00    Types: Cigarettes    Quit date: 04/05/2007    Years since quitting: 15.6    Passive exposure: Current (Husband)   Smokeless tobacco: Never  Substance Use Topics   Alcohol use: No   Drug use: No   Social History   Socioeconomic History   Marital status: Married    Spouse name: Casimiro Needle   Number of children: 2   Years of education: 16   Highest education level: Not on file  Occupational History   Occupation: retired  Tobacco Use   Smoking status: Former    Packs/day: 2.00    Years: 40.00    Additional pack years: 0.00    Total pack years: 80.00    Types: Cigarettes    Quit date: 04/05/2007    Years since quitting: 15.6    Passive exposure: Current (Husband)   Smokeless tobacco: Never  Vaping Use   Vaping Use: Not on file  Substance and  Sexual Activity   Alcohol use: No   Drug use: No   Sexual activity: Never    Partners: Male  Other Topics Concern   Not on file  Social History Narrative   Patient is right handed,reside in home with husband   Social Determinants of Health   Financial Resource Strain: Not on file  Food Insecurity: No Food Insecurity (11/02/2022)   Hunger Vital Sign    Worried About Running Out of Food in the Last Year: Never true    Ran Out of Food in the Last Year: Never true  Transportation Needs: No Transportation Needs (11/02/2022)   PRAPARE - Administrator, Civil Service (Medical): No    Lack of Transportation (Non-Medical): No  Physical Activity: Inactive (04/19/2018)   Exercise Vital Sign    Days of Exercise per Week: 0 days    Minutes of Exercise per Session: 0 min  Stress: No Stress Concern Present (04/19/2018)   [employer] of Occupational Health - Occupational Stress Questionnaire    Feeling of Stress : Not at all  Social Connections:  Not on file  Intimate Partner Violence: Not At Risk (02/14/2022)   Humiliation, Afraid, Rape, and Kick questionnaire    Fear of Current or Ex-Partner: No    Emotionally Abused: No    Physically Abused: No    Sexually Abused: No   Family Status  Relation Name Status   Mother  Deceased at age 76   Father  Deceased at age 16       pneumonia   Sister  Deceased       11/29/17   Neg Hx  (Not Specified)   Family History  Problem Relation Age of Onset   Stroke Mother 35   Heart disease Mother        congenital heart defect?   COPD Father    Heart disease Father    AAA (abdominal aortic aneurysm) Father    COPD Sister    Arthritis Sister    AAA (abdominal aortic aneurysm) Sister    Dementia Sister    Other Sister        brain injury   Multiple sclerosis Neg Hx    Allergies  Allergen Reactions   Codeine Itching and Nausea And Vomiting   Hydromorphone Hcl Nausea And Vomiting   Ibuprofen Other (See Comments)    stomach ulcers   Morphine Sulfate     "makes my blood pressure bottom out"   Metronidazole Hives, Nausea And Vomiting and Rash      Review of Systems  Constitutional:  Negative for fever and malaise/fatigue.  HENT:  Negative for congestion.   Eyes:  Negative for blurred vision.  Respiratory:  Negative for shortness of breath.   Cardiovascular:  Negative for chest pain, palpitations and leg swelling.  Gastrointestinal:  Negative for abdominal pain, blood in stool and nausea.  Genitourinary:  Negative for dysuria and frequency.  Musculoskeletal:  Negative for falls.  Skin:  Negative for rash.  Neurological:  Negative for dizziness, loss of consciousness and headaches.       Walks with walker but improving Finishing up with PT  Endo/Heme/Allergies:  Negative for environmental allergies.  Psychiatric/Behavioral:  Negative for depression. The patient is not nervous/anxious.       Objective:     BP 124/70 (BP Location: Left Arm, Patient Position: Sitting, Cuff  Size: Large)   Pulse 71   Temp 98.1 F (36.7 C) (Oral)   Resp 18  Ht 5\' 6"  (1.676 m)   Wt 198 lb 6.4 oz (90 kg)   SpO2 100%   BMI 32.02 kg/m  BP Readings from Last 3 Encounters:  11/21/22 124/70  11/01/22 (!) 144/70  10/23/22 (!) 147/73   Wt Readings from Last 3 Encounters:  11/21/22 198 lb 6.4 oz (90 kg)  10/31/22 199 lb (90.3 kg)  10/23/22 99 lb 12.8 oz (45.3 kg)   SpO2 Readings from Last 3 Encounters:  11/21/22 100%  10/31/22 100%  10/23/22 100%      Physical Exam Vitals and nursing note reviewed.  Constitutional:      Appearance: She is well-developed.  HENT:     Head: Normocephalic and atraumatic.     Nose: Nose normal.     Mouth/Throat:     Mouth: Mucous membranes are moist.     Pharynx: Oropharynx is clear.  Eyes:     Conjunctiva/sclera: Conjunctivae normal.  Neck:     Thyroid: No thyromegaly.     Vascular: No carotid bruit or JVD.  Cardiovascular:     Rate and Rhythm: Normal rate and regular rhythm.     Heart sounds: Normal heart sounds. No murmur heard. Pulmonary:     Effort: Pulmonary effort is normal. No respiratory distress.     Breath sounds: Normal breath sounds. No wheezing or rales.  Chest:     Chest wall: No tenderness.  Abdominal:     Palpations: Abdomen is soft.     Tenderness: There is no abdominal tenderness. There is no guarding or rebound.  Musculoskeletal:        General: Normal range of motion.     Cervical back: Normal range of motion and neck supple.     Comments: Walking with walker   Skin:    Findings: No rash.  Neurological:     General: No focal deficit present.     Mental Status: She is alert and oriented to person, place, and time.     Gait: Gait normal.     Deep Tendon Reflexes: Reflexes normal.  Psychiatric:        Mood and Affect: Mood normal.        Behavior: Behavior normal.        Thought Content: Thought content normal.        Judgment: Judgment normal.      Results for orders placed or performed in  visit on 11/21/22  CBC with Differential/Platelet  Result Value Ref Range   WBC 6.3 4.0 - 10.5 K/uL   RBC 4.26 3.87 - 5.11 Mil/uL   Hemoglobin 12.2 12.0 - 15.0 g/dL   HCT 54.0 98.1 - 19.1 %   MCV 87.6 78.0 - 100.0 fl   MCHC 32.8 30.0 - 36.0 g/dL   RDW 47.8 29.5 - 62.1 %   Platelets 185.0 150.0 - 400.0 K/uL   Neutrophils Relative % 71.9 43.0 - 77.0 %   Lymphocytes Relative 18.7 12.0 - 46.0 %   Monocytes Relative 7.7 3.0 - 12.0 %   Eosinophils Relative 1.3 0.0 - 5.0 %   Basophils Relative 0.4 0.0 - 3.0 %   Neutro Abs 4.5 1.4 - 7.7 K/uL   Lymphs Abs 1.2 0.7 - 4.0 K/uL   Monocytes Absolute 0.5 0.1 - 1.0 K/uL   Eosinophils Absolute 0.1 0.0 - 0.7 K/uL   Basophils Absolute 0.0 0.0 - 0.1 K/uL  Comprehensive metabolic panel  Result Value Ref Range   Sodium 130 (L) 135 - 145 mEq/L   Potassium  3.6 3.5 - 5.1 mEq/L   Chloride 94 (L) 96 - 112 mEq/L   CO2 24 19 - 32 mEq/L   Glucose, Bld 113 (H) 70 - 99 mg/dL   BUN 11 6 - 23 mg/dL   Creatinine, Ser 1.61 0.40 - 1.20 mg/dL   Total Bilirubin 0.7 0.2 - 1.2 mg/dL   Alkaline Phosphatase 88 39 - 117 U/L   AST 36 0 - 37 U/L   ALT 14 0 - 35 U/L   Total Protein 6.7 6.0 - 8.3 g/dL   Albumin 3.7 3.5 - 5.2 g/dL   GFR 09.60 (L) >45.40 mL/min   Calcium 9.2 8.4 - 10.5 mg/dL  Lipid panel  Result Value Ref Range   Cholesterol 119 0 - 200 mg/dL   Triglycerides 98.1 0.0 - 149.0 mg/dL   HDL 19.14 >78.29 mg/dL   VLDL 56.2 0.0 - 13.0 mg/dL   LDL Cholesterol 54 0 - 99 mg/dL   Total CHOL/HDL Ratio 3    NonHDL 72.96   TSH  Result Value Ref Range   TSH 2.07 0.35 - 5.50 uIU/mL  VITAMIN D 25 Hydroxy (Vit-D Deficiency, Fractures)  Result Value Ref Range   VITD 44.36 30.00 - 100.00 ng/mL  POCT Urinalysis Dipstick (Automated)  Result Value Ref Range   Color, UA yellow    Clarity, UA clear    Glucose, UA Negative Negative   Bilirubin, UA Negative    Ketones, UA Negative    Spec Grav, UA <=1.005 (A) 1.010 - 1.025   Blood, UA Negative    pH, UA 7.0 5.0 -  8.0   Protein, UA Negative Negative   Urobilinogen, UA 0.2 0.2 or 1.0 E.U./dL   Nitrite, UA Negative    Leukocytes, UA Small (1+) (A) Negative    Last CBC Lab Results  Component Value Date   WBC 6.3 11/21/2022   HGB 12.2 11/21/2022   HCT 37.3 11/21/2022   MCV 87.6 11/21/2022   MCH 29.9 11/01/2022   RDW 13.8 11/21/2022   PLT 185.0 11/21/2022   Last metabolic panel Lab Results  Component Value Date   GLUCOSE 113 (H) 11/21/2022   NA 130 (L) 11/21/2022   K 3.6 11/21/2022   CL 94 (L) 11/21/2022   CO2 24 11/21/2022   BUN 11 11/21/2022   CREATININE 0.97 11/21/2022   GFRNONAA 39 (L) 11/01/2022   CALCIUM 9.2 11/21/2022   PHOS 4.2 12/30/2017   PROT 6.7 11/21/2022   ALBUMIN 3.7 11/21/2022   LABGLOB 2.7 09/05/2016   AGRATIO 1.5 09/05/2016   BILITOT 0.7 11/21/2022   ALKPHOS 88 11/21/2022   AST 36 11/21/2022   ALT 14 11/21/2022   ANIONGAP 10 11/01/2022   Last lipids Lab Results  Component Value Date   CHOL 119 11/21/2022   HDL 45.80 11/21/2022   LDLCALC 54 11/21/2022   TRIG 94.0 11/21/2022   CHOLHDL 3 11/21/2022   Last hemoglobin A1c Lab Results  Component Value Date   HGBA1C 5.9 09/25/2016   Last thyroid functions Lab Results  Component Value Date   TSH 2.07 11/21/2022   T4TOTAL 10.7 12/23/2018   Last vitamin D Lab Results  Component Value Date   VD25OH 44.36 11/21/2022   Last vitamin B12 and Folate Lab Results  Component Value Date   VITAMINB12 256 05/19/2022      The ASCVD Risk score (Arnett DK, et al., 2019) failed to calculate for the following reasons:   The valid total cholesterol range is 130 to 320 mg/dL  Assessment & Plan:   Problem List Items Addressed This Visit       Unprioritized   Hypothyroidism   Relevant Medications   levothyroxine (SYNTHROID) 88 MCG tablet   Other Relevant Orders   TSH (Completed)   Hyperlipidemia   Relevant Medications   hydrochlorothiazide (HYDRODIURIL) 25 MG tablet   losartan (COZAAR) 100 MG tablet    Other Relevant Orders   Comprehensive metabolic panel (Completed)   Lipid panel (Completed)   THROMBOCYTOPENIA    Recheck today      Preventative health care - Primary    Ghm utd Check labs  See AVS Health Maintenance  Topic Date Due   Medicare Annual Wellness (AWV)  09/28/2018   DTaP/Tdap/Td (2 - Td or Tdap) 03/06/2021   COVID-19 Vaccine (5 - 2023-24 season) 12/07/2022 (Originally 02/03/2022)   Zoster Vaccines- Shingrix (1 of 2) 02/21/2023 (Originally 02/02/1970)   INFLUENZA VACCINE  01/04/2023   MAMMOGRAM  07/14/2023   Colonoscopy  07/26/2025   Pneumonia Vaccine 59+ Years old  Completed   DEXA SCAN  Completed   Hepatitis C Screening  Completed   HPV VACCINES  Aged Out   Lung Cancer Screening  Discontinued        Lichen sclerosus et atrophicus    stable      Hyperlipidemia LDL goal <70 (Chronic)    Encourage heart healthy diet such as MIND or DASH diet, increase exercise, avoid trans fats, simple carbohydrates and processed foods, consider a krill or fish or flaxseed oil cap daily.        Relevant Medications   hydrochlorothiazide (HYDRODIURIL) 25 MG tablet   losartan (COZAAR) 100 MG tablet   Essential hypertension (Chronic)    Well controlled, no changes to meds. Encouraged heart healthy diet such as the DASH diet and exercise as tolerated.        Relevant Medications   hydrochlorothiazide (HYDRODIURIL) 25 MG tablet   losartan (COZAAR) 100 MG tablet   CAD (coronary artery disease), native coronary artery (Chronic)    Per cardiology      Relevant Medications   hydrochlorothiazide (HYDRODIURIL) 25 MG tablet   losartan (COZAAR) 100 MG tablet   Anxiety    Con't prozac  Stable       Relevant Medications   FLUoxetine (PROZAC) 40 MG capsule   Other Visit Diagnoses     Vitamin D deficiency       Relevant Medications   Vitamin D, Ergocalciferol, (DRISDOL) 1.25 MG (50000 UNIT) CAPS capsule   Other Relevant Orders   VITAMIN D 25 Hydroxy (Vit-D Deficiency,  Fractures) (Completed)   Primary hypertension       Relevant Medications   hydrochlorothiazide (HYDRODIURIL) 25 MG tablet   losartan (COZAAR) 100 MG tablet   Other Relevant Orders   CBC with Differential/Platelet (Completed)   Comprehensive metabolic panel (Completed)   Lipid panel (Completed)   Frequent urination       Relevant Orders   POCT Urinalysis Dipstick (Automated) (Completed)     Assessment and Plan    Post-operative Aneurysm Surgery: Recovery progressing well three weeks post-operation. Incisions healing well. -Continue current care and follow-up with vascular surgeon, Dr. Randie Heinz.  Frequent Urination: Noted dry blood in vaginal area post-catheter removal. No dysuria. -Collect urinalysis to assess for potential urinary tract infection or other abnormalities.  General Health Maintenance: -Overdue for tetanus shot. Advised to get it at the pharmacy. -Declined shingles vaccine. -Continue regular follow-ups with ophthalmologist, Dr. Burgess Estelle, for retinal bleed. -Continue regular follow-ups with  neurologist, Dr. Ervin Knack. -Continue regular follow-ups with cardiologist, Dr. Rosalita Levan. -Continue regular dental check-ups at [facility]. -Declined mammograms and colonoscopies.  Follow-up: -Print out patient's papers for her records. -Meet patient's son, Yanessa Threats, and provide him with a note for his work.        Return in about 6 months (around 05/23/2023), or if symptoms worsen or fail to improve, for hypertension, hyperlipidemia.    [name], DO

## 2022-11-21 NOTE — Assessment & Plan Note (Signed)
Recheck today. 

## 2022-11-21 NOTE — Assessment & Plan Note (Signed)
Per cardiology 

## 2022-11-21 NOTE — Assessment & Plan Note (Signed)
stable °

## 2022-11-21 NOTE — Assessment & Plan Note (Signed)
Ghm utd Check labs  See AVS Health Maintenance  Topic Date Due   Medicare Annual Wellness (AWV)  09/28/2018   DTaP/Tdap/Td (2 - Td or Tdap) 03/06/2021   COVID-19 Vaccine (5 - 2023-24 season) 12/07/2022 (Originally 02/03/2022)   Zoster Vaccines- Shingrix (1 of 2) 02/21/2023 (Originally 02/02/1970)   INFLUENZA VACCINE  01/04/2023   MAMMOGRAM  07/14/2023   Colonoscopy  07/26/2025   Pneumonia Vaccine 19+ Years old  Completed   DEXA SCAN  Completed   Hepatitis C Screening  Completed   HPV VACCINES  Aged Out   Lung Cancer Screening  Discontinued

## 2022-11-22 LAB — URINE CULTURE
MICRO NUMBER:: 15096711
SPECIMEN QUALITY:: ADEQUATE

## 2022-11-28 DIAGNOSIS — H43813 Vitreous degeneration, bilateral: Secondary | ICD-10-CM | POA: Diagnosis not present

## 2022-11-28 DIAGNOSIS — H3561 Retinal hemorrhage, right eye: Secondary | ICD-10-CM | POA: Diagnosis not present

## 2022-11-28 DIAGNOSIS — H35371 Puckering of macula, right eye: Secondary | ICD-10-CM | POA: Diagnosis not present

## 2022-11-28 DIAGNOSIS — H04123 Dry eye syndrome of bilateral lacrimal glands: Secondary | ICD-10-CM | POA: Diagnosis not present

## 2022-11-28 DIAGNOSIS — H35362 Drusen (degenerative) of macula, left eye: Secondary | ICD-10-CM | POA: Diagnosis not present

## 2022-11-29 ENCOUNTER — Ambulatory Visit (HOSPITAL_BASED_OUTPATIENT_CLINIC_OR_DEPARTMENT_OTHER)
Admission: RE | Admit: 2022-11-29 | Discharge: 2022-11-29 | Disposition: A | Discharge status: Home / Self Care | Payer: PPO | Source: Ambulatory Visit | Attending: Vascular Surgery | Admitting: Vascular Surgery

## 2022-11-29 DIAGNOSIS — I7143 Infrarenal abdominal aortic aneurysm, without rupture: Secondary | ICD-10-CM | POA: Insufficient documentation

## 2022-11-29 DIAGNOSIS — I7772 Dissection of iliac artery: Secondary | ICD-10-CM | POA: Diagnosis not present

## 2022-11-29 DIAGNOSIS — Q272 Other congenital malformations of renal artery: Secondary | ICD-10-CM | POA: Diagnosis not present

## 2022-11-29 DIAGNOSIS — I728 Aneurysm of other specified arteries: Secondary | ICD-10-CM | POA: Diagnosis not present

## 2022-11-29 MED ORDER — IOHEXOL 300 MG/ML  SOLN
100.0000 mL | Freq: Once | INTRAMUSCULAR | Status: AC | PRN
  Administered 2022-11-29: 90 mL via INTRAVENOUS

## 2022-11-29 MED ORDER — IOHEXOL 350 MG/ML SOLN: 100.0000 mL | Freq: Once | INTRAVENOUS | Status: DC | PRN

## 2022-12-05 ENCOUNTER — Encounter: Payer: Self-pay | Admitting: Adult Health

## 2022-12-05 ENCOUNTER — Ambulatory Visit: Payer: PPO | Admitting: Adult Health

## 2022-12-05 VITALS — BP 125/72 | HR 62 | Ht 66.0 in | Wt 196.0 lb

## 2022-12-05 DIAGNOSIS — G35 Multiple sclerosis: Secondary | ICD-10-CM

## 2022-12-05 NOTE — Progress Notes (Signed)
PATIENT: Cassandra Rios DOB: Sep 01, 1950  REASON FOR VISIT: follow up HISTORY FROM: patient Primary neurologist: Dr. Anne Hahn  Chief Complaint  Patient presents with   Follow-up    Rm 8 alone Pt is well and stable, no new MS concerns since last visit      HISTORY OF PRESENT ILLNESS: Today 12/05/22:  Cassandra Rios is a 72 y.o. female with a history of multiple sclerosis. Returns today for follow-up. Remains on Betaseron and tolerating it well. Had MRI early this year that was relatively stable. No new symptoms. No new numbness or weakness. No change in gait or balance. Using a rollator. Notices some brain fog- notices it more after her AAA surgery. Reports some trouble remembering things. If she gets interrupted then she can't remember the conversation. Lives at home with her husband and son. Able to complete all ADLs independently. Able to do some household chores.   Had AAA surgery and it went well. Has FU tomorrow. Had a retinal bleed in right eye but it has cleared.     06/12/22: Cassandra Rios is a 72 y.o.female with a history of multiple sclerosis. Returns today for follow-up.  She remains on Betaseron. Has been in the hospital twice over the last year- one was for gastric ulcers and next time she went in for viral gastritis. Overall she feels that she has been relatively stable.  Reports that she is more dizzy currently taking meclizine.  Feels weaker since hospitalization. Reports constipation and uses sennakot. States that she stays well hydrated.  Reports that she may have a secondary cataract in the left- had to cancel her eye appointment d/t being sick. Needs to make another appointment.  Last MRI of the brain was in June 2021.  She recently had blood work in December.  Reports in the last 5 to 6 months she has had more headaches.  She states that they are typically in the left temporal region.  But has been in both temporal regions.  She states that the pain can  sometimes be stabbing pain but Tylenol usually relieves it  At the last visit with Dr. Marjory Lies put her on proprnaolol for tremor but it caused bradycardia and PCP took her off.   She does report that she has a fungus in her mouth and is causing her blisters which makes it hard to swallow and her cough has increased. This started within  the last month. No new medication. Saw PCP couple of weeks ago and reports that PCP did not see anything and therefore no medication was started.   Also, has Adominal aortic aneursym- will be repeating MRI d/t enlargement. Possible intervention this year  UPDATE (11/29/21, VRP): Since last visit, had GI bleed admission in May 2023. Now having some issues with increased BP, working with PCP. Has had 1 fall since last visit (home).    05/25/21: Cassandra Rios is a 72 year old female with a history of multiple sclerosis.  She returns today for follow-up.  She remains on Betaseron.  Overall she feels that she is doing well.  Denies any new numbness or weakness.  No changes with her gait or balance.  She does have ongoing back pain and uses a walker.  No changes with the bowels or bladder.  No changes with the vision.  Reports that she has received injections for back pain with Dr. Ethelene Hal with a did not give her lasting benefit neck step is ablation of the nerve.  11/27/19: MRI of the brain with and without contrast:  IMPRESSION: This MRI of the brain with and without contrast shows the following: 1.   Multiple T2/FLAIR hyperintense foci in the hemispheres in a pattern and configuration consistent with chronic demyelinating plaque associated with multiple sclerosis.  Some foci could also be due to chronic microvascular ischemic changes.  None of the lesions appear to be acute and they do not enhance.  Compared to the MRI dated 09/21/2017, there is no definite interval change. 2.   The pituitary gland is thinned within a normal sized sella turcica.  This is usually an incidental  finding but could also be due to elevated intracranial pressures. 3.   There are no acute findings and there is a normal enhancement pattern.  11/22/20: [name] is a 72 year old female with a history of Multiple sclerosis. She returns today for follow-up.  Overall the patient feels that she is doing well.  She remains on Betaseron.  Denies any new numbness or weakness.  Patient states that she does use a walker but this is due to back pain.  Denies any changes with the bowels or bladder.  She does have some issues with constipation due to her pain medication.  No changes with her vision.  She continues to have low back pain and will be receiving injections by Dr. Ethelene Hal on Thursday  05/19/20: Cassandra Rios is a 71 year old female with a history of multiple sclerosis.  She returns today for follow-up.  She continues on Betaseron.  Reports that she tolerates the medication well other than some bruising at the injection site.  She denies any new numbness or weakness.  Denies any significant changes with her gait or balance.  States that she may get off balance with certain movements.  Denies any changes with the bowels or bladder.  Returns today for evaluation.  11/17/19: Cassandra Rios is a 72 year old female with a history of multiple sclerosis.  She returns today for follow-up.  She remains on Betaseron.  She denies any changes with her gait or balance.  No changes with her vision.  Denies any changes with the bowels or bladder.  She does report new tingling sensation in the lower extremities bilaterally.  She describes this as a pins and needle sensation.  Reports that the sensation comes and goes.  Seems to be worse when she sits down.  She does take gabapentin at bedtime and finds it helpful.  She denies being diabetic although her recent lab work shows elevated glucose level.  I do not see a recent hemoglobin A1c.  She returns today for an evaluation.  HISTORY 05/14/19:   Cassandra Rios is a 72 year old  female with a history of multiple sclerosis.  She returns today for follow-up.  She remains on Betaseron.  She denies any new symptoms.  No new numbness or tingling.  No changes with her gait or balance.  No change with the bowels or bladder.  No changes with her vision.  She states that she has been having shortness of breath.  Her cardiologist has ordered a CT of the heart.  She denies any other symptoms.  She returns today for an evaluation.  REVIEW OF SYSTEMS: Out of a complete 14 system review of symptoms, the patient complains only of the following symptoms, and all other reviewed systems are negative.  See HPI  ALLERGIES: Allergies  Allergen Reactions   Codeine Itching and Nausea And Vomiting   Hydromorphone Hcl Nausea And Vomiting  Ibuprofen Other (See Comments)    stomach ulcers   Morphine Sulfate     "makes my blood pressure bottom out"   Metronidazole Hives, Nausea And Vomiting and Rash    HOME MEDICATIONS: Outpatient Medications Prior to Visit  Medication Sig Dispense Refill   acetaminophen (TYLENOL) 500 MG tablet Take 1,000 mg by mouth every 6 (six) hours as needed for moderate pain or headache.     atorvastatin (LIPITOR) 80 MG tablet Take 1 tablet (80 mg total) by mouth daily. 90 tablet 3   clobetasol ointment (TEMOVATE) 0.05 % Apply 1 Application topically daily as needed (rash).     FLUoxetine (PROZAC) 40 MG capsule Take 1 capsule (40 mg total) by mouth daily. 90 capsule 3   gabapentin (NEURONTIN) 300 MG capsule Take 600 mg by mouth every evening.      hydrochlorothiazide (HYDRODIURIL) 25 MG tablet Take 1 tablet (25 mg total) by mouth daily. 90 tablet 1   HYDROcodone-acetaminophen (NORCO/VICODIN) 5-325 MG tablet Take 0.5 tablets by mouth every 6 (six) hours as needed for moderate pain.     Interferon Beta-1b (BETASERON/EXTAVIA) 0.3 MG KIT injection Inject 0.25 mg into the skin every other day. 42 each 3   levothyroxine (SYNTHROID) 88 MCG tablet Take 1 tablet (88 mcg  total) by mouth daily. 90 tablet 0   losartan (COZAAR) 100 MG tablet Take 1 tablet (100 mg total) by mouth daily. 90 tablet 1   mometasone (NASONEX) 50 MCG/ACT nasal spray Place 2 sprays into the nose daily as needed (allergies).     pantoprazole (PROTONIX) 40 MG tablet Take 40 mg by mouth daily as needed (acid reflux).     Polyethyl Glycol-Propyl Glycol (SYSTANE ULTRA OP) Place 1 drop into the right eye daily as needed (dry eye).      silver sulfADIAZINE (SILVADENE) 1 % cream Apply 1 application. topically daily as needed (irritated areas under abdomen).     triamcinolone ointment (KENALOG) 0.1 % Apply 1 application  topically 2 (two) times daily.     traMADol (ULTRAM) 50 MG tablet Take 50 mg by mouth every 12 (twelve) hours as needed (headache). (Patient not taking: Reported on 12/05/2022)     Vitamin D, Ergocalciferol, (DRISDOL) 1.25 MG (50000 UNIT) CAPS capsule Take 1 capsule (50,000 Units total) by mouth every 7 (seven) days. (Patient not taking: Reported on 12/05/2022) 12 capsule 1   No facility-administered medications prior to visit.    PAST MEDICAL HISTORY: Past Medical History:  Diagnosis Date   AAA (abdominal aortic aneurysm) (HCC)    Allergic rhinitis    Arthritis    Chronic hoarseness    Colon polyps    COPD (chronic obstructive pulmonary disease) (HCC)    Dr. Marchelle Gearing   Dyslipidemia    Gastric bleed 10/2021   stomach ulcers   Gastritis    GERD (gastroesophageal reflux disease)    Hypertension    Hypothyroidism    MS (multiple sclerosis) (HCC)    Neuromuscular disorder (HCC)    Obesity    Stomach ulcer from aspirin/ibuprofen-like drugs (NSAID's)     PAST SURGICAL HISTORY: Past Surgical History:  Procedure Laterality Date   ABDOMINAL AORTIC ENDOVASCULAR STENT GRAFT N/A 10/31/2022   Procedure: ABDOMINAL AORTIC ENDOVASCULAR STENT GRAFT;  Surgeon: Maeola Harman, MD;  Location[facility];  Service: Vascular;  Laterality: N/A;   ABDOMINAL HYSTERECTOMY      BARIATRIC SURGERY  09/07/2008   lap band   CARPAL TUNNEL RELEASE     CATARACT EXTRACTION Right  11/08/2017   CATARACT EXTRACTION Left 11/22/2017   CATARACT EXTRACTION, BILATERAL      Toric lenses for astigmatism   CHOLECYSTECTOMY     CHOLECYSTECTOMY     ESOPHAGOGASTRODUODENOSCOPY (EGD) WITH PROPOFOL N/A 10/17/2021   Procedure: ESOPHAGOGASTRODUODENOSCOPY (EGD) WITH PROPOFOL;  Surgeon: Willis Modena, MD;  Location[facility] ENDOSCOPY;  Service: Gastroenterology;  Laterality: N/A;   HIATAL HERNIA REPAIR  09/07/2008   LEFT HEART CATH AND CORONARY ANGIOGRAPHY N/A 07/09/2019   Procedure: LEFT HEART CATH AND CORONARY ANGIOGRAPHY;  Surgeon: Lyn Records, MD;  Location: [facility] INVASIVE CV LAB;  Service: Cardiovascular;  Laterality: N/A;   TONSILLECTOMY     ULTRASOUND GUIDANCE FOR VASCULAR ACCESS Bilateral 10/31/2022   Procedure: ULTRASOUND GUIDANCE FOR VASCULAR ACCESS;  Surgeon: Maeola Harman, MD;  Location: [facility] OR;  Service: Vascular;  Laterality: Bilateral;    FAMILY HISTORY: Family History  Problem Relation Age of Onset   Stroke Mother 36   Heart disease Mother        congenital heart defect?   COPD Father    Heart disease Father    AAA (abdominal aortic aneurysm) Father    COPD Sister    Arthritis Sister    AAA (abdominal aortic aneurysm) Sister    Dementia Sister    Other Sister        brain injury   Multiple sclerosis Neg Hx     SOCIAL HISTORY: Social History   Socioeconomic History   Marital status: Married    Spouse name: Casimiro Needle   Number of children: 2   Years of education: 16   Highest education level: Not on file  Occupational History   Occupation: retired  Tobacco Use   Smoking status: Former    Packs/day: 2.00    Years: 40.00    Additional pack years: 0.00    Total pack years: 80.00    Types: Cigarettes    Quit date: 04/05/2007    Years since quitting: 15.6    Passive exposure: Current (Husband)   Smokeless tobacco: Never  Vaping Use   Vaping Use:  Not on file  Substance and Sexual Activity   Alcohol use: No   Drug use: No   Sexual activity: Never    Partners: Male  Other Topics Concern   Not on file  Social History Narrative   Patient is right handed,reside in home with husband   Social Determinants of Health   Financial Resource Strain: Not on file  Food Insecurity: No Food Insecurity (11/02/2022)   Hunger Vital Sign    Worried About Running Out of Food in the Last Year: Never true    Ran Out of Food in the Last Year: Never true  Transportation Needs: No Transportation Needs (11/02/2022)   PRAPARE - Administrator, Civil Service (Medical): No    Lack of Transportation (Non-Medical): No  Physical Activity: Inactive (04/19/2018)   Exercise Vital Sign    Days of Exercise per Week: 0 days    Minutes of Exercise per Session: 0 min  Stress: No Stress Concern Present (04/19/2018)   [employer] of Occupational Health - Occupational Stress Questionnaire    Feeling of Stress : Not at all  Social Connections: Not on file  Intimate Partner Violence: Not At Risk (02/14/2022)   Humiliation, Afraid, Rape, and Kick questionnaire    Fear of Current or Ex-Partner: No    Emotionally Abused: No    Physically Abused: No    Sexually Abused: No  PHYSICAL EXAM  Vitals:   12/05/22 1011  BP: 125/72  Pulse: 62  Weight: 196 lb (88.9 kg)  Height: 5\' 6"  (1.676 m)   Body mass index is 31.64 kg/m.  Generalized: Well developed, in no acute distress   Neurological examination  Mentation: Alert oriented to time, place, history taking. Follows all commands speech and language fluent Cranial nerve II-XII: Pupils were equal round reactive to light.  Head turning and shoulder shrug  were normal and symmetric. Motor: The motor testing reveals 5 over 5 strength of all 4 extremities. Good symmetric motor tone is noted throughout.  Sensory: Sensory testing is intact to soft touch on all 4 extremities. No evidence of  extinction is noted.  Coordination: Cerebellar testing reveals good finger-nose-finger and heel-to-shin bilaterally.  Mild tremor noted in the upper extremities. Gait and station: Patient uses a rollator when ambulating.   DIAGNOSTIC DATA (LABS, IMAGING, TESTING) - I reviewed patient records, labs, notes, testing and imaging myself where available.  Lab Results  Component Value Date   WBC 6.3 11/21/2022   HGB 12.2 11/21/2022   HCT 37.3 11/21/2022   MCV 87.6 11/21/2022   PLT 185.0 11/21/2022      Component Value Date/Time   NA 130 (L) 11/21/2022 1037   NA 131 (L) 01/11/2022 1008   K 3.6 11/21/2022 1037   CL 94 (L) 11/21/2022 1037   CO2 24 11/21/2022 1037   GLUCOSE 113 (H) 11/21/2022 1037   BUN 11 11/21/2022 1037   BUN 12 01/11/2022 1008   CREATININE 0.97 11/21/2022 1037   CREATININE 1.04 05/13/2014 0840   CALCIUM 9.2 11/21/2022 1037   PROT 6.7 11/21/2022 1037   PROT 6.2 08/26/2019 0731   ALBUMIN 3.7 11/21/2022 1037   ALBUMIN 3.9 08/26/2019 0731   AST 36 11/21/2022 1037   ALT 14 11/21/2022 1037   ALKPHOS 88 11/21/2022 1037   BILITOT 0.7 11/21/2022 1037   BILITOT 0.3 08/26/2019 0731   GFRNONAA 39 (L) 11/01/2022 0244   GFRNONAA 57 (L) 05/13/2014 0840   GFRAA 67 07/03/2019 1515   GFRAA 66 05/13/2014 0840   Lab Results  Component Value Date   CHOL 119 11/21/2022   HDL 45.80 11/21/2022   LDLCALC 54 11/21/2022   TRIG 94.0 11/21/2022   CHOLHDL 3 11/21/2022   Lab Results  Component Value Date   HGBA1C 5.9 09/25/2016   Lab Results  Component Value Date   VITAMINB12 256 05/19/2022   Lab Results  Component Value Date   TSH 2.07 11/21/2022      ASSESSMENT AND PLAN 72 y.o. year old female  has a past medical history of AAA (abdominal aortic aneurysm) (HCC), Allergic rhinitis, Arthritis, Chronic hoarseness, Colon polyps, COPD (chronic obstructive pulmonary disease) (HCC), Dyslipidemia, Gastric bleed (10/2021), Gastritis, GERD (gastroesophageal reflux disease),  Hypertension, Hypothyroidism, MS (multiple sclerosis) (HCC), Neuromuscular disorder (HCC), Obesity, and Stomach ulcer from aspirin/ibuprofen-like drugs (NSAID's). here with:  Multiple sclerosis  Continue Betaseron Reviewed labwork in EPIC from 11/21/22 FU 6 months are sooner if needed  Advised patient if her symptoms worsen or she develops new symptoms she should let us know follow-up in 6-7 months or sooner if needed    Butch Penny, MSN, NP-C 12/05/2022, 10:43 AM [facility] [address] [place], [place] [address] [phone]

## 2022-12-05 NOTE — Patient Instructions (Signed)
Your Plan: ° °Continue Betaseron  °If your symptoms worsen or you develop new symptoms please let us know.  ° °Thank you for coming to see us at [facility]. I hope we have been able to provide you high quality care today. ° °You may receive a patient satisfaction survey over the next few weeks. We would appreciate your feedback and comments so that we may continue to improve ourselves and the health of our patients. ° °

## 2022-12-06 ENCOUNTER — Ambulatory Visit (INDEPENDENT_AMBULATORY_CARE_PROVIDER_SITE_OTHER): Payer: PPO | Admitting: Vascular Surgery

## 2022-12-06 ENCOUNTER — Encounter: Payer: Self-pay | Admitting: Vascular Surgery

## 2022-12-06 VITALS — BP 129/83 | HR 56 | Temp 97.9°F | Resp 20 | Ht 66.0 in | Wt 196.1 lb

## 2022-12-06 DIAGNOSIS — I7143 Infrarenal abdominal aortic aneurysm, without rupture: Secondary | ICD-10-CM

## 2022-12-06 NOTE — Progress Notes (Signed)
Subjective:     Patient ID: Cassandra Rios, female   DOB: August 02, 1950, 72 y.o.   MRN: [id number]  HPI77 year old female follows up after abdominal aortic aneurysm repair.  She has no complaints today is getting around without complication with her rolling walker.   Review of Systems No complaints today    Objective:   Physical Exam Vitals:   12/06/22 1026  BP: 129/83  Pulse: (!) 56  Resp: 20  Temp: 97.9 F (36.6 C)  SpO2: 99%   Awake alert oriented Nonlabored respirations Bilateral groins are healed well palpable pulses    CT IMPRESSION: 1. Interval endograft repair of infrarenal abdominal aortic aneurysm. No evidence of endoleak. Maximum diameter of the aneurysm sac is 4.9 x 4.1 cm compared to 5.0 x 4.5 cm on prior exam. 2. 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 3. No acute abnormality of the abdomen or pelvis.    Assessment/plan     72 year old female status post endovascular aneurysm repair now recovering well without evidence of endoleak.  Will be for follow-up in 1 year with post EVAR duplex.  Turner Baillie C. Randie Heinz, MD [facility] Office: [phone] Pager: [phone]

## 2022-12-13 ENCOUNTER — Telehealth: Payer: Self-pay | Admitting: Adult Health

## 2022-12-13 NOTE — Telephone Encounter (Signed)
Unable to reach pt over the phone, no VM box. Sent mychart msg informing pt of need to reschedule 06/25/23 appt - NP out

## 2022-12-15 ENCOUNTER — Other Ambulatory Visit: Payer: Self-pay | Admitting: Family Medicine

## 2022-12-22 ENCOUNTER — Other Ambulatory Visit: Payer: Self-pay

## 2022-12-22 DIAGNOSIS — I7143 Infrarenal abdominal aortic aneurysm, without rupture: Secondary | ICD-10-CM

## 2022-12-26 ENCOUNTER — Ambulatory Visit: Payer: PPO | Admitting: Adult Health

## 2022-12-26 DIAGNOSIS — L900 Lichen sclerosus et atrophicus: Secondary | ICD-10-CM | POA: Diagnosis not present

## 2022-12-26 DIAGNOSIS — D071 Carcinoma in situ of vulva: Secondary | ICD-10-CM | POA: Diagnosis not present

## 2022-12-26 DIAGNOSIS — D489 Neoplasm of uncertain behavior, unspecified: Secondary | ICD-10-CM | POA: Diagnosis not present

## 2023-01-17 ENCOUNTER — Other Ambulatory Visit: Payer: Self-pay | Admitting: Family Medicine

## 2023-01-17 DIAGNOSIS — E039 Hypothyroidism, unspecified: Secondary | ICD-10-CM

## 2023-01-24 ENCOUNTER — Telehealth: Payer: Self-pay

## 2023-01-24 NOTE — Telephone Encounter (Signed)
Spoke with the patient regarding the referral to GYN oncology. Patient scheduled as new patient with Dr Alvester Morin on 02/12/2023. Patient given an arrival time of 9:15am.  Explained to the patient the the doctor will perform a pelvic exam at this visit. Patient given the policy that only one visitor allowed and that visitor must be over 16 yrs are allowed in the Cancer Center. Patient given the address/phone number for the clinic and that the center offers free valet service. Patient aware that masks are option.

## 2023-02-08 ENCOUNTER — Encounter: Payer: Self-pay | Admitting: Psychiatry

## 2023-02-12 ENCOUNTER — Encounter: Payer: Self-pay | Admitting: Psychiatry

## 2023-02-12 ENCOUNTER — Inpatient Hospital Stay: Payer: PPO | Attending: Psychiatry | Admitting: Psychiatry

## 2023-02-12 ENCOUNTER — Other Ambulatory Visit: Payer: Self-pay

## 2023-02-12 ENCOUNTER — Inpatient Hospital Stay (HOSPITAL_BASED_OUTPATIENT_CLINIC_OR_DEPARTMENT_OTHER): Payer: PPO | Admitting: Gynecologic Oncology

## 2023-02-12 VITALS — BP 130/50 | HR 61 | Temp 97.3°F | Resp 18 | Wt 200.2 lb

## 2023-02-12 DIAGNOSIS — D071 Carcinoma in situ of vulva: Secondary | ICD-10-CM | POA: Insufficient documentation

## 2023-02-12 DIAGNOSIS — Z79899 Other long term (current) drug therapy: Secondary | ICD-10-CM | POA: Diagnosis not present

## 2023-02-12 DIAGNOSIS — I10 Essential (primary) hypertension: Secondary | ICD-10-CM | POA: Diagnosis not present

## 2023-02-12 DIAGNOSIS — I2510 Atherosclerotic heart disease of native coronary artery without angina pectoris: Secondary | ICD-10-CM | POA: Insufficient documentation

## 2023-02-12 DIAGNOSIS — Z79890 Hormone replacement therapy (postmenopausal): Secondary | ICD-10-CM | POA: Insufficient documentation

## 2023-02-12 DIAGNOSIS — E039 Hypothyroidism, unspecified: Secondary | ICD-10-CM | POA: Insufficient documentation

## 2023-02-12 DIAGNOSIS — J449 Chronic obstructive pulmonary disease, unspecified: Secondary | ICD-10-CM | POA: Insufficient documentation

## 2023-02-12 DIAGNOSIS — M1990 Unspecified osteoarthritis, unspecified site: Secondary | ICD-10-CM | POA: Diagnosis not present

## 2023-02-12 DIAGNOSIS — K219 Gastro-esophageal reflux disease without esophagitis: Secondary | ICD-10-CM | POA: Diagnosis not present

## 2023-02-12 DIAGNOSIS — Z8679 Personal history of other diseases of the circulatory system: Secondary | ICD-10-CM | POA: Insufficient documentation

## 2023-02-12 DIAGNOSIS — G35 Multiple sclerosis: Secondary | ICD-10-CM | POA: Diagnosis not present

## 2023-02-12 DIAGNOSIS — Z87891 Personal history of nicotine dependence: Secondary | ICD-10-CM | POA: Insufficient documentation

## 2023-02-12 NOTE — Patient Instructions (Addendum)
Preparing for your Surgery  Plan for surgery on February 27, 2023 with Dr. [name] at [facility]. You will be scheduled for examination under anesthesia, simple partial vulvectomy.   Pre-operative Testing -You will receive a phone call from presurgical testing at [facility] to discuss surgery instructions and arrange for lab work if needed.  -Bring your insurance card, copy of an advanced directive if applicable, medication list.  -You should not be taking blood thinners or aspirin at least ten days prior to surgery unless instructed by your surgeon.  -Do not take supplements such as fish oil (omega 3), red yeast rice, turmeric before your surgery. You want to avoid medications with aspirin in them including headache powders such as BC or Goody's), Excedrin migraine.  Day Before Surgery at Home -You will be advised you can have clear liquids up until 3 hours before your surgery.    Your role in recovery Your role is to become active as soon as directed by your doctor, while still giving yourself time to heal.  Rest when you feel tired. You will be asked to do the following in order to speed your recovery:  - Cough and breathe deeply. This helps to clear and expand your lungs and can prevent pneumonia after surgery.  - STAY ACTIVE WHEN YOU GET HOME. Do mild physical activity. Walking or moving your legs help your circulation and body functions return to normal. Do not try to get up or walk alone the first time after surgery.   -If you develop swelling on one leg or the other, pain in the back of your leg, redness/warmth in one of your legs, please call the office or go to the Emergency Room to have a doppler to rule out a blood clot. For shortness of breath, chest pain-seek care in the Emergency Room as soon as possible. - Actively manage your pain. Managing your pain lets you move in comfort. We will ask you to rate your pain on a scale of zero to 10. It is your  responsibility to tell your doctor or nurse where and how much you hurt so your pain can be treated.  Special Considerations -Your final pathology results from surgery should be available around one week after surgery and the results will be relayed to you when available.  -FMLA forms can be faxed to [phone] and please allow 5-7 business days for completion.  Pain Management After Surgery -You can use the hydrocodone/APAP you have at home.   -Make sure that you have Tylenol at home IF YOU ARE ABLE TO TAKE THESE MEDICATION to use on a regular basis after surgery for pain control. Monitor your tylenol intake since hydrocodone/APAP has tylenol in it.   -Review the attached handout on narcotic use and their risks and side effects.   Bowel Regimen -It is important to prevent constipation and drink adequate amounts of liquids. You can plan on taking the sennakot you have a home nightly (1-2 tablets at bedtime every night for at least the first week).  Risks of Surgery Risks of surgery are low but include bleeding, infection, damage to surrounding structures, re-operation, blood clots, and very rarely death.  AFTER SURGERY INSTRUCTIONS  Return to work:  1-2 weeks if applicable  We recommend purchasing several bags of frozen green peas and dividing them into ziploc bags. You will want to keep these in the freezer and have them ready to use as ice packs to the vulvar incision. Once the  ice pack is no longer cold, you can get another from the freezer. The frozen peas mold to your body better than a regular ice pack.   Activity: 1. Be up and out of the bed during the day.  Take a nap if needed.  You may walk up steps but be careful and use the hand rail.  Stair climbing will tire you more than you think, you may need to stop part way and rest.   2. No lifting or straining for 4 weeks over 10 pounds. No pushing, pulling, straining for 4 weeks.  3. No driving for minimum 24 hours after  surgery.  Do not drive if you are taking narcotic pain medicine and make sure that your reaction time has returned.   4. You can shower as soon as the next day after surgery. Shower daily. No tub baths or submerging your body in water until cleared by your surgeon. If you have the soap that was given to you by pre-surgical testing that was used before surgery, you do not need to use it afterwards because this can irritate your incisions.   5. No sexual activity and nothing in the vagina for 4-6 weeks.  6. You may experience vaginal spotting and discharge after surgery.  The spotting is normal but if you experience heavy bleeding, call our office.  7. Take Tylenol first for pain if you are able to take these medication and only use narcotic pain medication for severe pain not relieved by the Tylenol.  Monitor your Tylenol intake to a max of 4,000 mg in a 24 hour period.  Diet: 1. Low sodium Heart Healthy Diet is recommended but you are cleared to resume your normal (before surgery) diet after your procedure.  2. It is safe to use a laxative, such as Miralax or Colace, if you have difficulty moving your bowels. You can take Sennakot at bedtime every evening to keep bowel movements regular and to prevent constipation.    Wound Care: 1. Keep clean and dry.  Shower daily.  Reasons to call the Doctor: Fever - Oral temperature greater than 100.4 degrees Fahrenheit Foul-smelling vaginal discharge Difficulty urinating Nausea and vomiting Increased pain at the site of the incision that is unrelieved with pain medicine. Difficulty breathing with or without chest pain New calf pain especially if only on one side Sudden, continuing increased vaginal bleeding with or without clots.   Contacts: For questions or concerns you should contact:  Dr. [name] at [phone]  Warner Mccreedy, NP at [phone]  After Hours: call [phone] and have the GYN Oncologist paged/contacted (after 5 pm  or on the weekends).  Messages sent via mychart are for non-urgent matters and are not responded to after hours so for urgent needs, please call the after hours number.

## 2023-02-12 NOTE — Progress Notes (Signed)
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  We reviewed the nature of vulvar dysplasia. Surgical excision is the mainstay of treatment, but ablative therapy or pharmacologic treatment is an option for some patients in certain clinical scenarios. The goals of treatment of vulvar dysplasia are to prevent development of vulvar squamous carcinoma and relieve any associated symptoms, such as pain or itching. The goal is also preserve vulvar anatomy as best as possible.  Excision provides both treatment and a diagnostic specimen for those women with high-grade dysplasia. Invasive squamous cell carcinoma is present at the time of excision in 10-22% of women with VIN on initial biopsy.    She has elected to proceed with surgical excision.  Patient was consented for: pelvic exam under anesthesia, simple partial vulvectomy on 02/27/23.  The risks of surgery were discussed in detail and she understands these to including but not limited to bleeding requiring a blood transfusion, infection, injury to adjacent organs (including but not limited to the blood vessels, nerves, anus, vagina, urethra, clitoris).  If the patient experiences any of these events, she understands that her hospitalization or recovery may be prolonged and that she may need to take additional medications for a prolonged period. The patient will receive DVT and antibiotic prophylaxis as indicated. She voiced a clear understanding. She had the opportunity to ask questions and informed consent was obtained today. She wishes to proceed.  She obtained clearance prior to her abdominal aortic aneurysm repair in May. No major changes at this time so suspect that she will not need an repeat testing but will obtain cardiac clearance. All  preoperative instructions were reviewed. Postoperative expectations were also reviewed. Written handouts were provided to the patient.   A copy of this note was sent to the patient's referring provider.  [name], MD Gynecologic Oncology   Medical Decision Making I personally spent  TOTAL 60 minutes face-to-face and non-face-to-face in the care of this patient, which includes all pre, intra, and post visit time on the date of service.   ------------  CC: VIN3  HISTORY OF PRESENT ILLNESS:  Cassandra Rios is a 72 y.o. 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  Patient's symptoms included pruritus and tenderness.  She is taking gabapentin 600 mg at night, triamcinolone ointment daily, clobetasol ointment for flares 2 times a day.  At the time of visit, patient reported that she is still having flares.  At a prior visit on 08/22/2022 there was some concern for a whitish thick patch on the left perineal area.  At his visit in July she then underwent a biopsy of this area.  Biopsy returned with VIN 3.  Today patient reports that she has ongoing vulvar itching.  No current vaginal bleeding.  Does feel like she can feel some polyp-like tissue.  The area is tender and burns.  In terms of her other medical conditions, she reports that her MS is stable.  She is currently asymptomatic.  She recently underwent and abdominal aortic endovascular stent graft in May for abdominal aortic aneurysm. She also follows with cardiology for CAD.  She reports that her cardiologist has retired but she  is due to establish with Dr. Anne Fu.  She was last seen in cardiology by Neila Gear., NP on 09/19/22 prior to vascular surgery.  Her last echo was on 10/18/2021.  For preoperative workup she underwent a myocardial perfusion study on  09/28/2022.  Study was reported to be normal with an EF of 71%.  Otherwise she reports degenerative disc disease in her back for which she uses a walker.  She does not walk up stairs or significant distances due to this.  However, she reports no changes in her cardiac or pulmonary symptoms since her workup in April for her surgery.  Denies chest pain or shortness of breath.   PAST MEDICAL HISTORY: Past Medical History:  Diagnosis Date   AAA (abdominal aortic aneurysm) (HCC)    Allergic rhinitis    Arthritis    Chronic hoarseness    Colon polyps    COPD (chronic obstructive pulmonary disease) (HCC)    Dr. Marchelle Gearing   Coronary artery disease    Dyslipidemia    Gastric bleed 10/2021   stomach ulcers   Gastritis    GERD (gastroesophageal reflux disease)    Hypertension    Hypothyroidism    MS (multiple sclerosis) (HCC)    Neuromuscular disorder (HCC)    Obesity    Stomach ulcer from aspirin/ibuprofen-like drugs (NSAID's)     PAST SURGICAL HISTORY: Past Surgical History:  Procedure Laterality Date   ABDOMINAL AORTIC ENDOVASCULAR STENT GRAFT N/A 10/31/2022   Procedure: ABDOMINAL AORTIC ENDOVASCULAR STENT GRAFT;  Surgeon: Maeola Harman, MD;  Location: [facility] OR;  Service: Vascular;  Laterality: N/A;   ABDOMINAL HYSTERECTOMY     Endometriosis   BARIATRIC SURGERY  09/07/2008   lap band   CARPAL TUNNEL RELEASE     CATARACT EXTRACTION Right 11/08/2017   CATARACT EXTRACTION Left 11/22/2017   CATARACT EXTRACTION, BILATERAL      Toric lenses for astigmatism   CHOLECYSTECTOMY     ESOPHAGOGASTRODUODENOSCOPY (EGD) WITH PROPOFOL N/A 10/17/2021   Procedure: ESOPHAGOGASTRODUODENOSCOPY (EGD) WITH PROPOFOL;  Surgeon: Willis Modena, MD;  Location: [facility] ENDOSCOPY;  Service: Gastroenterology;  Laterality: N/A;   HIATAL HERNIA REPAIR  09/07/2008   LEFT HEART CATH AND CORONARY ANGIOGRAPHY N/A 07/09/2019   Procedure: LEFT HEART CATH AND CORONARY ANGIOGRAPHY;  Surgeon: Lyn Records, MD;   Location: [facility] INVASIVE CV LAB;  Service: Cardiovascular;  Laterality: N/A;   TONSILLECTOMY     ULTRASOUND GUIDANCE FOR VASCULAR ACCESS Bilateral 10/31/2022   Procedure: ULTRASOUND GUIDANCE FOR VASCULAR ACCESS;  Surgeon: Maeola Harman, MD;  Location: [facility] OR;  Service: Vascular;  Laterality: Bilateral;    OB/GYN HISTORY: OB History  Gravida Para Term Preterm AB Living  4 2 2   2     SAB IAB Ectopic Multiple Live Births  1 1     2     # Outcome Date GA Lbr Len/2nd Weight Sex Type Anes PTL Lv  4 IAB           3 SAB      SAB     2 Term      CS-Unspec     1 Term      Vag-Spont         Age at menarche: 47 Age at menopause: unknown, hysterectomy age 11 Hx of HRT: none Hx of STI: none Last pap: NILM, HPV HR neg (06/30/13) History of abnormal pap smears: none  SCREENING STUDIES:  Last mammogram: 07/2022 Last colonoscopy: 2022  MEDICATIONS:  Current Outpatient Medications:  acetaminophen (TYLENOL) 500 MG tablet, Take 1,000 mg by mouth every 6 (six) hours as needed for moderate pain or headache., Disp: , Rfl:    atorvastatin (LIPITOR) 80 MG tablet, Take 1 tablet (80 mg total) by mouth daily., Disp: 90 tablet, Rfl: 3   clobetasol ointment (TEMOVATE) 0.05 %, Apply 1 Application topically daily as needed (rash)., Disp: , Rfl:    FLUoxetine (PROZAC) 40 MG capsule, Take 1 capsule (40 mg total) by mouth daily., Disp: 90 capsule, Rfl: 3   gabapentin (NEURONTIN) 300 MG capsule, Take 600 mg by mouth every evening. , Disp: , Rfl:    hydrochlorothiazide (HYDRODIURIL) 25 MG tablet, Take 1 tablet (25 mg total) by mouth daily., Disp: 90 tablet, Rfl: 1   HYDROcodone-acetaminophen (NORCO/VICODIN) 5-325 MG tablet, Take 0.5 tablets by mouth every 6 (six) hours as needed for moderate pain., Disp: , Rfl:    Interferon Beta-1b (BETASERON/EXTAVIA) 0.3 MG KIT injection, Inject 0.25 mg into the skin every other day., Disp: 42 each, Rfl: 3   levothyroxine (SYNTHROID) 88 MCG tablet, TAKE 1 TABLET BY  MOUTH EVERY DAY, Disp: 90 tablet, Rfl: 0   losartan (COZAAR) 100 MG tablet, Take 1 tablet (100 mg total) by mouth daily., Disp: 90 tablet, Rfl: 1   mometasone (NASONEX) 50 MCG/ACT nasal spray, Place 2 sprays into the nose daily as needed (allergies)., Disp: , Rfl:    pantoprazole (PROTONIX) 40 MG tablet, Take 40 mg by mouth daily as needed (acid reflux)., Disp: , Rfl:    Polyethyl Glycol-Propyl Glycol (SYSTANE ULTRA OP), Place 1 drop into the right eye daily as needed (dry eye). , Disp: , Rfl:    silver sulfADIAZINE (SILVADENE) 1 % cream, Apply 1 application. topically daily as needed (irritated areas under abdomen)., Disp: , Rfl:   ALLERGIES: Allergies  Allergen Reactions   Codeine Itching and Nausea And Vomiting   Hydromorphone Hcl Nausea And Vomiting   Ibuprofen Other (See Comments)    stomach ulcers   Morphine Sulfate     "makes my blood pressure bottom out"   Metronidazole Hives, Nausea And Vomiting and Rash    FAMILY HISTORY: Family History  Problem Relation Age of Onset   Stroke Mother 84   Heart disease Mother        congenital heart defect?   COPD Father    Heart disease Father    AAA (abdominal aortic aneurysm) Father    COPD Sister    Arthritis Sister    AAA (abdominal aortic aneurysm) Sister    Dementia Sister    Other Sister        brain injury   Multiple sclerosis Neg Hx    Breast cancer Neg Hx    Ovarian cancer Neg Hx    Endometrial cancer Neg Hx    Colon cancer Neg Hx     SOCIAL HISTORY: Social History   Socioeconomic History   Marital status: Married    Spouse name: Casimiro Needle   Number of children: 2   Years of education: 16   Highest education level: Not on file  Occupational History   Occupation: retired  Tobacco Use   Smoking status: Former    Current packs/day: 0.00    Average packs/day: 2.0 packs/day for 40.0 years (80.0 ttl pk-yrs)    Types: Cigarettes    Start date: 04/05/1967    Quit date: 04/05/2007    Years since quitting: 15.8     Passive exposure: Current (Husband)   Smokeless tobacco: Never  Vaping Use  Vaping status: Not on file  Substance and Sexual Activity   Alcohol use: No   Drug use: No   Sexual activity: Not Currently    Partners: Male  Other Topics Concern   Not on file  Social History Narrative   Patient is right handed,reside in home with husband   Social Determinants of Health   Financial Resource Strain: Not on file  Food Insecurity: No Food Insecurity (11/02/2022)   Hunger Vital Sign    Worried About Running Out of Food in the Last Year: Never true    Ran Out of Food in the Last Year: Never true  Transportation Needs: No Transportation Needs (11/02/2022)   PRAPARE - Administrator, Civil Service (Medical): No    Lack of Transportation (Non-Medical): No  Physical Activity: Inactive (04/19/2018)   Exercise Vital Sign    Days of Exercise per Week: 0 days    Minutes of Exercise per Session: 0 min  Stress: No Stress Concern Present (04/19/2018)   [employer] of Occupational Health - Occupational Stress Questionnaire    Feeling of Stress : Not at all  Social Connections: Not on file  Intimate Partner Violence: Not At Risk (02/14/2022)   Humiliation, Afraid, Rape, and Kick questionnaire    Fear of Current or Ex-Partner: No    Emotionally Abused: No    Physically Abused: No    Sexually Abused: No    REVIEW OF SYSTEMS: New patient intake form was reviewed.  Complete 10-system review is negative except for the following: Diarrhea, back pain, problem walking  PHYSICAL EXAM: BP (!) 130/50 (BP Location: Left Arm, Patient Position: Sitting) Comment: informed nurse Jaime  Pulse 61   Temp (!) 97.3 F (36.3 C) (Tympanic)   Resp 18   Wt 200 lb 3.2 oz (90.8 kg)   SpO2 100%   BMI 32.31 kg/m  Constitutional: No acute distress. Neuro/Psych: Alert, oriented.  Head and Neck: Normocephalic, atraumatic. Neck symmetric without masses. Sclera anicteric.  Respiratory: Normal work of  breathing. Clear to auscultation bilaterally. Cardiovascular: Regular rate and rhythm, no murmurs, rubs, or gallops. Occasional PVC Abdomen: Normoactive bowel sounds. Soft, non-distended, non-tender to palpation. No masses appreciated. Extremities: Grossly normal range of motion. Warm, well perfused. No edema bilaterally. Skin: Erythema under pannus Lymphatic: No cervical, supraclavicular, or inguinal adenopathy. Genitourinary: External genitalia with raised white lesion of the posterior fourchette on the left into the midline with small extension to the perineal body.  On speculum exam, normal vaginal mucosa, atrophic.  Bimanual exam with no nodularity or pelvic mass. Exam chaperoned by Warner Mccreedy, NP  VULVAR COLPOSCOPY PROCEDURE NOTE  Procedure Details: After appropriate verbal informed consent was obtained, a timeout was performed. Acetic acid was applied to the vulva with the findings as noted below. Following the procedure, the area was rinsed with warm water The patient tolerated the procedure well.   Adequate Exam: yes  Biopsy Specimen: none  Condition: Stable. Patient tolerated procedure well.  Complications: None  Findings: Acetowhite changes of 1cm raised lesion of the left posterior fourchette that extends to the anterior perineal body. No other lesions.   Colposcopic Impression: VIN3   LABORATORY AND RADIOLOGIC DATA: Outside medical records were reviewed to synthesize the above history, along with the history and physical obtained during the visit.  Outside laboratory, pathology reports were reviewed, with pertinent results below.    WBC  Date Value Ref Range Status  11/21/2022 6.3 4.0 - 10.5 K/uL Final   Hemoglobin  Date Value Ref Range Status  11/21/2022 12.2 12.0 - 15.0 g/dL Final  16/03/9603 54.0 11.1 - 15.9 g/dL Final   HGB  Date Value Ref Range Status  07/07/2010 14.3 11.6 - 15.9 g/dL Final   HCT  Date Value Ref Range Status  11/21/2022 37.3 36.0 - 46.0  % Final  07/07/2010 42.1 34.8 - 46.6 % Final   Hematocrit  Date Value Ref Range Status  01/11/2022 43.8 34.0 - 46.6 % Final   Platelets  Date Value Ref Range Status  11/21/2022 185.0 150.0 - 400.0 K/uL Final  01/11/2022 139 (L) 150 - 450 x10E3/uL Final    Comment:    **Result Repeated**   LDH  Date Value Ref Range Status  03/16/2010 135 94 - 250 U/L Final   Magnesium  Date Value Ref Range Status  10/31/2022 1.7 1.7 - 2.4 mg/dL Final    Comment:    Performed at [facility], [address], [place], [place] [address]   Creat  Date Value Ref Range Status  05/13/2014 1.04 0.50 - 1.10 mg/dL Final   Creatinine, Ser  Date Value Ref Range Status  11/21/2022 0.97 0.40 - 1.20 mg/dL Final   AST  Date Value Ref Range Status  11/21/2022 36 0 - 37 U/L Final   ALT  Date Value Ref Range Status  11/21/2022 14 0 - 35 U/L Final    Surgical pathology (12/26/22): Final Diagnosis     A. VULVA, BIOPSY:               High-grade squamous intraepithelial lesion (VIN-3)  Electronically signed by Clydell Hakim, MD on 12/29/2022 at  9:22 AM  Comment   Immunohistochemistry was performed on block A1. The p16 shows block-type positivity. The p53 stain is wild type pattern.

## 2023-02-12 NOTE — H&P (View-Only) (Signed)
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  We reviewed the nature of vulvar dysplasia. Surgical excision is the mainstay of treatment, but ablative therapy or pharmacologic treatment is an option for some patients in certain clinical scenarios. The goals of treatment of vulvar dysplasia are to prevent development of vulvar squamous carcinoma and relieve any associated symptoms, such as pain or itching. The goal is also preserve vulvar anatomy as best as possible.  Excision provides both treatment and a diagnostic specimen for those women with high-grade dysplasia. Invasive squamous cell carcinoma is present at the time of excision in 10-22% of women with VIN on initial biopsy.    She has elected to proceed with surgical excision.  Patient was consented for: pelvic exam under anesthesia, simple partial vulvectomy on 02/27/23.  The risks of surgery were discussed in detail and she understands these to including but not limited to bleeding requiring a blood transfusion, infection, injury to adjacent organs (including but not limited to the blood vessels, nerves, anus, vagina, urethra, clitoris).  If the patient experiences any of these events, she understands that her hospitalization or recovery may be prolonged and that she may need to take additional medications for a prolonged period. The patient will receive DVT and antibiotic prophylaxis as indicated. She voiced a clear understanding. She had the opportunity to ask questions and informed consent was obtained today. She wishes to proceed.  She obtained clearance prior to her abdominal aortic aneurysm repair in May. No major changes at this time so suspect that she will not need an repeat testing but will obtain cardiac clearance. All  preoperative instructions were reviewed. Postoperative expectations were also reviewed. Written handouts were provided to the patient.   A copy of this note was sent to the patient's referring provider.  [name], MD Gynecologic Oncology   Medical Decision Making I personally spent  TOTAL 60 minutes face-to-face and non-face-to-face in the care of this patient, which includes all pre, intra, and post visit time on the date of service.   ------------  CC: VIN3  HISTORY OF PRESENT ILLNESS:  Cassandra Rios is a 72 y.o. 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  Patient's symptoms included pruritus and tenderness.  She is taking gabapentin 600 mg at night, triamcinolone ointment daily, clobetasol ointment for flares 2 times a day.  At the time of visit, patient reported that she is still having flares.  At a prior visit on 08/22/2022 there was some concern for a whitish thick patch on the left perineal area.  At his visit in July she then underwent a biopsy of this area.  Biopsy returned with VIN 3.  Today patient reports that she has ongoing vulvar itching.  No current vaginal bleeding.  Does feel like she can feel some polyp-like tissue.  The area is tender and burns.  In terms of her other medical conditions, she reports that her MS is stable.  She is currently asymptomatic.  She recently underwent and abdominal aortic endovascular stent graft in May for abdominal aortic aneurysm. She also follows with cardiology for CAD.  She reports that her cardiologist has retired but she  is due to establish with Dr. Anne Fu.  She was last seen in cardiology by Neila Gear., NP on 09/19/22 prior to vascular surgery.  Her last echo was on 10/18/2021.  For preoperative workup she underwent a myocardial perfusion study on  09/28/2022.  Study was reported to be normal with an EF of 71%.  Otherwise she reports degenerative disc disease in her back for which she uses a walker.  She does not walk up stairs or significant distances due to this.  However, she reports no changes in her cardiac or pulmonary symptoms since her workup in April for her surgery.  Denies chest pain or shortness of breath.   PAST MEDICAL HISTORY: Past Medical History:  Diagnosis Date   AAA (abdominal aortic aneurysm) (HCC)    Allergic rhinitis    Arthritis    Chronic hoarseness    Colon polyps    COPD (chronic obstructive pulmonary disease) (HCC)    Dr. Marchelle Gearing   Coronary artery disease    Dyslipidemia    Gastric bleed 10/2021   stomach ulcers   Gastritis    GERD (gastroesophageal reflux disease)    Hypertension    Hypothyroidism    MS (multiple sclerosis) (HCC)    Neuromuscular disorder (HCC)    Obesity    Stomach ulcer from aspirin/ibuprofen-like drugs (NSAID's)     PAST SURGICAL HISTORY: Past Surgical History:  Procedure Laterality Date   ABDOMINAL AORTIC ENDOVASCULAR STENT GRAFT N/A 10/31/2022   Procedure: ABDOMINAL AORTIC ENDOVASCULAR STENT GRAFT;  Surgeon: Maeola Harman, MD;  Location: [facility] OR;  Service: Vascular;  Laterality: N/A;   ABDOMINAL HYSTERECTOMY     Endometriosis   BARIATRIC SURGERY  09/07/2008   lap band   CARPAL TUNNEL RELEASE     CATARACT EXTRACTION Right 11/08/2017   CATARACT EXTRACTION Left 11/22/2017   CATARACT EXTRACTION, BILATERAL      Toric lenses for astigmatism   CHOLECYSTECTOMY     ESOPHAGOGASTRODUODENOSCOPY (EGD) WITH PROPOFOL N/A 10/17/2021   Procedure: ESOPHAGOGASTRODUODENOSCOPY (EGD) WITH PROPOFOL;  Surgeon: Willis Modena, MD;  Location: [facility] ENDOSCOPY;  Service: Gastroenterology;  Laterality: N/A;   HIATAL HERNIA REPAIR  09/07/2008   LEFT HEART CATH AND CORONARY ANGIOGRAPHY N/A 07/09/2019   Procedure: LEFT HEART CATH AND CORONARY ANGIOGRAPHY;  Surgeon: Lyn Records, MD;   Location: [facility] INVASIVE CV LAB;  Service: Cardiovascular;  Laterality: N/A;   TONSILLECTOMY     ULTRASOUND GUIDANCE FOR VASCULAR ACCESS Bilateral 10/31/2022   Procedure: ULTRASOUND GUIDANCE FOR VASCULAR ACCESS;  Surgeon: Maeola Harman, MD;  Location: [facility] OR;  Service: Vascular;  Laterality: Bilateral;    OB/GYN HISTORY: OB History  Gravida Para Term Preterm AB Living  4 2 2   2     SAB IAB Ectopic Multiple Live Births  1 1     2     # Outcome Date GA Lbr Len/2nd Weight Sex Type Anes PTL Lv  4 IAB           3 SAB      SAB     2 Term      CS-Unspec     1 Term      Vag-Spont         Age at menarche: 14 Age at menopause: unknown, hysterectomy age 48 Hx of HRT: none Hx of STI: none Last pap: NILM, HPV HR neg (06/30/13) History of abnormal pap smears: none  SCREENING STUDIES:  Last mammogram: 07/2022 Last colonoscopy: 2022  MEDICATIONS:  Current Outpatient Medications:  acetaminophen (TYLENOL) 500 MG tablet, Take 1,000 mg by mouth every 6 (six) hours as needed for moderate pain or headache., Disp: , Rfl:    atorvastatin (LIPITOR) 80 MG tablet, Take 1 tablet (80 mg total) by mouth daily., Disp: 90 tablet, Rfl: 3   clobetasol ointment (TEMOVATE) 0.05 %, Apply 1 Application topically daily as needed (rash)., Disp: , Rfl:    FLUoxetine (PROZAC) 40 MG capsule, Take 1 capsule (40 mg total) by mouth daily., Disp: 90 capsule, Rfl: 3   gabapentin (NEURONTIN) 300 MG capsule, Take 600 mg by mouth every evening. , Disp: , Rfl:    hydrochlorothiazide (HYDRODIURIL) 25 MG tablet, Take 1 tablet (25 mg total) by mouth daily., Disp: 90 tablet, Rfl: 1   HYDROcodone-acetaminophen (NORCO/VICODIN) 5-325 MG tablet, Take 0.5 tablets by mouth every 6 (six) hours as needed for moderate pain., Disp: , Rfl:    Interferon Beta-1b (BETASERON/EXTAVIA) 0.3 MG KIT injection, Inject 0.25 mg into the skin every other day., Disp: 42 each, Rfl: 3   levothyroxine (SYNTHROID) 88 MCG tablet, TAKE 1 TABLET BY  MOUTH EVERY DAY, Disp: 90 tablet, Rfl: 0   losartan (COZAAR) 100 MG tablet, Take 1 tablet (100 mg total) by mouth daily., Disp: 90 tablet, Rfl: 1   mometasone (NASONEX) 50 MCG/ACT nasal spray, Place 2 sprays into the nose daily as needed (allergies)., Disp: , Rfl:    pantoprazole (PROTONIX) 40 MG tablet, Take 40 mg by mouth daily as needed (acid reflux)., Disp: , Rfl:    Polyethyl Glycol-Propyl Glycol (SYSTANE ULTRA OP), Place 1 drop into the right eye daily as needed (dry eye). , Disp: , Rfl:    silver sulfADIAZINE (SILVADENE) 1 % cream, Apply 1 application. topically daily as needed (irritated areas under abdomen)., Disp: , Rfl:   ALLERGIES: Allergies  Allergen Reactions   Codeine Itching and Nausea And Vomiting   Hydromorphone Hcl Nausea And Vomiting   Ibuprofen Other (See Comments)    stomach ulcers   Morphine Sulfate     "makes my blood pressure bottom out"   Metronidazole Hives, Nausea And Vomiting and Rash    FAMILY HISTORY: Family History  Problem Relation Age of Onset   Stroke Mother 40   Heart disease Mother        congenital heart defect?   COPD Father    Heart disease Father    AAA (abdominal aortic aneurysm) Father    COPD Sister    Arthritis Sister    AAA (abdominal aortic aneurysm) Sister    Dementia Sister    Other Sister        brain injury   Multiple sclerosis Neg Hx    Breast cancer Neg Hx    Ovarian cancer Neg Hx    Endometrial cancer Neg Hx    Colon cancer Neg Hx     SOCIAL HISTORY: Social History   Socioeconomic History   Marital status: Married    Spouse name: Casimiro Needle   Number of children: 2   Years of education: 16   Highest education level: Not on file  Occupational History   Occupation: retired  Tobacco Use   Smoking status: Former    Current packs/day: 0.00    Average packs/day: 2.0 packs/day for 40.0 years (80.0 ttl pk-yrs)    Types: Cigarettes    Start date: 04/05/1967    Quit date: 04/05/2007    Years since quitting: 15.8     Passive exposure: Current (Husband)   Smokeless tobacco: Never  Vaping Use  Vaping status: Not on file  Substance and Sexual Activity   Alcohol use: No   Drug use: No   Sexual activity: Not Currently    Partners: Male  Other Topics Concern   Not on file  Social History Narrative   Patient is right handed,reside in home with husband   Social Determinants of Health   Financial Resource Strain: Not on file  Food Insecurity: No Food Insecurity (11/02/2022)   Hunger Vital Sign    Worried About Running Out of Food in the Last Year: Never true    Ran Out of Food in the Last Year: Never true  Transportation Needs: No Transportation Needs (11/02/2022)   PRAPARE - Administrator, Civil Service (Medical): No    Lack of Transportation (Non-Medical): No  Physical Activity: Inactive (04/19/2018)   Exercise Vital Sign    Days of Exercise per Week: 0 days    Minutes of Exercise per Session: 0 min  Stress: No Stress Concern Present (04/19/2018)   [employer] of Occupational Health - Occupational Stress Questionnaire    Feeling of Stress : Not at all  Social Connections: Not on file  Intimate Partner Violence: Not At Risk (02/14/2022)   Humiliation, Afraid, Rape, and Kick questionnaire    Fear of Current or Ex-Partner: No    Emotionally Abused: No    Physically Abused: No    Sexually Abused: No    REVIEW OF SYSTEMS: New patient intake form was reviewed.  Complete 10-system review is negative except for the following: Diarrhea, back pain, problem walking  PHYSICAL EXAM: BP (!) 130/50 (BP Location: Left Arm, Patient Position: Sitting) Comment: informed nurse Jaime  Pulse 61   Temp (!) 97.3 F (36.3 C) (Tympanic)   Resp 18   Wt 200 lb 3.2 oz (90.8 kg)   SpO2 100%   BMI 32.31 kg/m  Constitutional: No acute distress. Neuro/Psych: Alert, oriented.  Head and Neck: Normocephalic, atraumatic. Neck symmetric without masses. Sclera anicteric.  Respiratory: Normal work of  breathing. Clear to auscultation bilaterally. Cardiovascular: Regular rate and rhythm, no murmurs, rubs, or gallops. Occasional PVC Abdomen: Normoactive bowel sounds. Soft, non-distended, non-tender to palpation. No masses appreciated. Extremities: Grossly normal range of motion. Warm, well perfused. No edema bilaterally. Skin: Erythema under pannus Lymphatic: No cervical, supraclavicular, or inguinal adenopathy. Genitourinary: External genitalia with raised white lesion of the posterior fourchette on the left into the midline with small extension to the perineal body.  On speculum exam, normal vaginal mucosa, atrophic.  Bimanual exam with no nodularity or pelvic mass. Exam chaperoned by Warner Mccreedy, NP  VULVAR COLPOSCOPY PROCEDURE NOTE  Procedure Details: After appropriate verbal informed consent was obtained, a timeout was performed. Acetic acid was applied to the vulva with the findings as noted below. Following the procedure, the area was rinsed with warm water The patient tolerated the procedure well.   Adequate Exam: yes  Biopsy Specimen: none  Condition: Stable. Patient tolerated procedure well.  Complications: None  Findings: Acetowhite changes of 1cm raised lesion of the left posterior fourchette that extends to the anterior perineal body. No other lesions.   Colposcopic Impression: VIN3   LABORATORY AND RADIOLOGIC DATA: Outside medical records were reviewed to synthesize the above history, along with the history and physical obtained during the visit.  Outside laboratory, pathology reports were reviewed, with pertinent results below.    WBC  Date Value Ref Range Status  11/21/2022 6.3 4.0 - 10.5 K/uL Final   Hemoglobin  Date Value Ref Range Status  11/21/2022 12.2 12.0 - 15.0 g/dL Final  16/03/9603 54.0 11.1 - 15.9 g/dL Final   HGB  Date Value Ref Range Status  07/07/2010 14.3 11.6 - 15.9 g/dL Final   HCT  Date Value Ref Range Status  11/21/2022 37.3 36.0 - 46.0  % Final  07/07/2010 42.1 34.8 - 46.6 % Final   Hematocrit  Date Value Ref Range Status  01/11/2022 43.8 34.0 - 46.6 % Final   Platelets  Date Value Ref Range Status  11/21/2022 185.0 150.0 - 400.0 K/uL Final  01/11/2022 139 (L) 150 - 450 x10E3/uL Final    Comment:    **Result Repeated**   LDH  Date Value Ref Range Status  03/16/2010 135 94 - 250 U/L Final   Magnesium  Date Value Ref Range Status  10/31/2022 1.7 1.7 - 2.4 mg/dL Final    Comment:    Performed at [facility], [address], [place], [place] [address]   Creat  Date Value Ref Range Status  05/13/2014 1.04 0.50 - 1.10 mg/dL Final   Creatinine, Ser  Date Value Ref Range Status  11/21/2022 0.97 0.40 - 1.20 mg/dL Final   AST  Date Value Ref Range Status  11/21/2022 36 0 - 37 U/L Final   ALT  Date Value Ref Range Status  11/21/2022 14 0 - 35 U/L Final    Surgical pathology (12/26/22): Final Diagnosis     A. VULVA, BIOPSY:               High-grade squamous intraepithelial lesion (VIN-3)  Electronically signed by Clydell Hakim, MD on 12/29/2022 at  9:22 AM  Comment   Immunohistochemistry was performed on block A1. The p16 shows block-type positivity. The p53 stain is wild type pattern.

## 2023-02-14 ENCOUNTER — Encounter: Payer: Self-pay | Admitting: Gynecologic Oncology

## 2023-02-14 DIAGNOSIS — C519 Malignant neoplasm of vulva, unspecified: Secondary | ICD-10-CM | POA: Insufficient documentation

## 2023-02-14 DIAGNOSIS — D071 Carcinoma in situ of vulva: Secondary | ICD-10-CM | POA: Insufficient documentation

## 2023-02-15 ENCOUNTER — Telehealth: Payer: Self-pay | Admitting: *Deleted

## 2023-02-15 ENCOUNTER — Encounter (HOSPITAL_COMMUNITY): Payer: Self-pay

## 2023-02-15 ENCOUNTER — Telehealth: Payer: Self-pay

## 2023-02-15 NOTE — Telephone Encounter (Signed)
Pre-operative Risk Assessment    Patient Name: Cassandra Rios  DOB: May 26, 1951 MRN: [id number]      Request for Surgical Clearance    Procedure:   PELVIC EXAM UNDER ANESTHESIA, PARTIAL VULVECTOMY  Date of Surgery:  Clearance 02/27/23                                 Surgeon:  DR. [name] Surgeon's Group or Practice Name:  [facility] / GYNECOLOGY ONCOLOFY Phone number:  [phone] Fax number:  [phone]   Type of Clearance Requested:   - Medical    Type of Anesthesia:  General    Additional requests/questions:    Wilhemina Cash   02/15/2023, 10:38 AM

## 2023-02-15 NOTE — Telephone Encounter (Signed)
Pt has been scheduled for a tele visit 02/22/23 10:40 ok'd by EM due to timing of procedure.  Consent on file / medications reconciled.

## 2023-02-15 NOTE — Telephone Encounter (Signed)
Pt has been scheduled for a tele visit 02/22/23 10:40 ok'd by EM due to timing of procedure.  Consent on file / medications reconciled.     Patient Consent for Virtual Visit        Cassandra Rios has provided verbal consent on 02/15/2023 for a virtual visit (video or telephone).   CONSENT FOR VIRTUAL VISIT FOR:  Cassandra Rios  By participating in this virtual visit I agree to the following:  I hereby voluntarily request, consent and authorize [facility] and its employed or contracted physicians, physician assistants, nurse practitioners or other licensed health care professionals (the Practitioner), to provide me with telemedicine health care services (the "Services") as deemed necessary by the treating Practitioner. I acknowledge and consent to receive the Services by the Practitioner via telemedicine. I understand that the telemedicine visit will involve communicating with the Practitioner through live audiovisual communication technology and the disclosure of certain medical information by electronic transmission. I acknowledge that I have been given the opportunity to request an in-person assessment or other available alternative prior to the telemedicine visit and am voluntarily participating in the telemedicine visit.  I understand that I have the right to withhold or withdraw my consent to the use of telemedicine in the course of my care at any time, without affecting my right to future care or treatment, and that the Practitioner or I may terminate the telemedicine visit at any time. I understand that I have the right to inspect all information obtained and/or recorded in the course of the telemedicine visit and may receive copies of available information for a reasonable fee.  I understand that some of the potential risks of receiving the Services via telemedicine include:  Delay or interruption in medical evaluation due to technological equipment failure or  disruption; Information transmitted may not be sufficient (e.g. poor resolution of images) to allow for appropriate medical decision making by the Practitioner; and/or  In rare instances, security protocols could fail, causing a breach of personal health information.  Furthermore, I acknowledge that it is my responsibility to provide information about my medical history, conditions and care that is complete and accurate to the best of my ability. I acknowledge that Practitioner's advice, recommendations, and/or decision may be based on factors not within their control, such as incomplete or inaccurate data provided by me or distortions of diagnostic images or specimens that may result from electronic transmissions. I understand that the practice of medicine is not an exact science and that Practitioner makes no warranties or guarantees regarding treatment outcomes. I acknowledge that a copy of this consent can be made available to me via my patient portal [facility] MyChart), or I can request a printed copy by calling the office of [facility].    I understand that my insurance will be billed for this visit.   I have read or had this consent read to me. I understand the contents of this consent, which adequately explains the benefits and risks of the Services being provided via telemedicine.  I have been provided ample opportunity to ask questions regarding this consent and the Services and have had my questions answered to my satisfaction. I give my informed consent for the services to be provided through the use of telemedicine in my medical care

## 2023-02-15 NOTE — Progress Notes (Signed)
Patient here for new patient consultation with Dr. Alvester Morin and for a pre-operative appointment prior to her scheduled surgery on 02/27/2023. She is scheduled for a EUA, WLE vulva. The surgery was discussed in detail.  See after visit summary for additional details.    Discussed post-op pain management in detail including the aspects of the enhanced recovery pathway.  She has hydrocodone/APAP she can use at home if needed and is under a pain contract with Dr. Ethelene Hal. We discussed the use of tylenol post-op and to monitor for a maximum of 4,000 mg in a 24 hour period. She also has sennakot to be used after surgery and to hold if having loose stools.  Discussed bowel regimen in detail.     Discussed measures to take at home to prevent DVT including frequent mobility.  Reportable signs and symptoms of DVT discussed. Post-operative instructions discussed and expectations for after surgery. Incisional care discussed as well including reportable signs and symptoms including erythema, drainage, wound separation.     10 minutes spent preparing information and with the patient.  Verbalizing understanding of material discussed. No needs or concerns voiced at the end of the visit. Advised patient to call for any needs.    This appointment is included in the global surgical bundle as pre-operative teaching and has no charge.

## 2023-02-15 NOTE — Telephone Encounter (Signed)
Name: Argiro Boundy  DOB: [birth year]  MRN: [id number]  Primary Cardiologist: None   Preoperative team, please contact this patient and set up a phone call appointment for further preoperative risk assessment. Please obtain consent and complete medication review. Thank you for your help.  I confirm that guidance regarding antiplatelet and oral anticoagulation therapy has been completed and, if necessary, noted below (none requested).    Joylene Grapes, NP 02/15/2023, 10:49 AM [facility]

## 2023-02-15 NOTE — Telephone Encounter (Signed)
Faxed over cardiac clearance to patients cardiology office with Dr [name].  [phone] office number and fax #[phone]

## 2023-02-15 NOTE — Patient Instructions (Addendum)
SURGICAL WAITING ROOM VISITATION  Patients having surgery or a procedure may have no more than 2 support people in the waiting area - these visitors may rotate.    Children under the age of 51 must have an adult with them who is not the patient.  If the patient needs to stay at the hospital during part of their recovery, the visitor guidelines for inpatient rooms apply. Pre-op nurse will coordinate an appropriate time for 1 support person to accompany patient in pre-op.  This support person may not rotate.    Please refer to the [facility] website for the visitor guidelines for Inpatients (after your surgery is over and you are in a regular room).       Your procedure is scheduled on: Tuesday, Sept. 24, 2024   Report to [facility] Main Entrance    Report to admitting at 5:15 AM   Call this number if you have problems the morning of surgery [phone]   Do not eat food :After Midnight.   After Midnight you may have the following liquids until 4:30  AM DAY OF SURGERY  Water Non-Citrus Juices (without pulp, NO RED-Apple, White grape, White cranberry) Black Coffee (NO MILK/CREAM OR CREAMERS, sugar ok)  Clear Tea (NO MILK/CREAM OR CREAMERS, sugar ok) regular and decaf                             Plain Jell-O (NO RED)                                           Fruit ices (not with fruit pulp, NO RED)                                     Popsicles (NO RED)                                                               Sports drinks like Gatorade (NO RED)  FOLLOW ANY ADDITIONAL PRE OP INSTRUCTIONS YOU RECEIVED FROM YOUR SURGEON'S OFFICE!!!     Oral Hygiene is also important to reduce your risk of infection.                                    Remember - BRUSH YOUR TEETH THE MORNING OF SURGERY WITH YOUR REGULAR TOOTHPASTE  DENTURES WILL BE REMOVED PRIOR TO SURGERY PLEASE DO NOT APPLY "Poly grip" OR ADHESIVES!!!   Do NOT smoke after Midnight   Stop all vitamins and herbal  supplements 7 days before surgery.   Take these medicines the morning of surgery with A SIP OF WATER:  Fluoxetine Atorvastatin Levothyroxine Pantoprazole  DO NOT TAKE ANY ORAL DIABETIC MEDICATIONS DAY OF YOUR SURGERY  Bring CPAP mask and tubing day of surgery.                              You may not have any metal  on your body including hair pins, jewelry, and body piercing             Do not wear make-up, lotions, powders, perfumes/cologne, or deodorant  Do not wear nail polish including gel and S&S, artificial/acrylic nails, or any other type of covering on natural nails including finger and toenails. If you have artificial nails, gel coating, etc. that needs to be removed by a nail salon please have this removed prior to surgery or surgery may need to be canceled/ delayed if the surgeon/ anesthesia feels like they are unable to be safely monitored.   Do not shave  48 hours prior to surgery.    Do not bring valuables to the hospital. [facility] IS NOT             RESPONSIBLE   FOR VALUABLES.   Contacts, glasses, dentures or bridgework may not be worn into surgery.   Bring small overnight bag day of surgery.   DO NOT BRING YOUR HOME MEDICATIONS TO THE HOSPITAL. PHARMACY WILL DISPENSE MEDICATIONS LISTED ON YOUR MEDICATION LIST TO YOU DURING YOUR ADMISSION IN THE HOSPITAL!    Patients discharged on the day of surgery will not be allowed to drive home.  Someone NEEDS to stay with you for the first 24 hours after anesthesia.   Special Instructions: Bring a copy of your healthcare power of attorney and living will documents the day of surgery if you haven't scanned them before.              Please read over the following fact sheets you were given: IF YOU HAVE QUESTIONS ABOUT YOUR PRE-OP INSTRUCTIONS PLEASE CALL [phone]    If you test positive for Covid or have been in contact with anyone that has tested positive in the last 10 days please notify you surgeon.    [facility]  - Preparing for Surgery Before surgery, you can play an important role.  Because skin is not sterile, your skin needs to be as free of germs as possible.  You can reduce the number of germs on your skin by washing with CHG (chlorahexidine gluconate) soap before surgery.  CHG is an antiseptic cleaner which kills germs and bonds with the skin to continue killing germs even after washing. Please DO NOT use if you have an allergy to CHG or antibacterial soaps.  If your skin becomes reddened/irritated stop using the CHG and inform your nurse when you arrive at Short Stay. Do not shave (including legs and underarms) for at least 48 hours prior to the first CHG shower.  You may shave your face/neck.  Please follow these instructions carefully:  1.  Shower with CHG Soap the night before surgery and the  morning of surgery.  2.  If you choose to wash your hair, wash your hair first as usual with your normal  shampoo.  3.  After you shampoo, rinse your hair and body thoroughly to remove the shampoo.                             4.  Use CHG as you would any other liquid soap.  You can apply chg directly to the skin and wash.  Gently with a scrungie or clean washcloth.  5.  Apply the CHG Soap to your body ONLY FROM THE NECK DOWN.   Do   not use on face/ open  Wound or open sores. Avoid contact with eyes, ears mouth and   genitals (private parts).                       Wash face,  Genitals (private parts) with your normal soap.             6.  Wash thoroughly, paying special attention to the area where your    surgery  will be performed.  7.  Thoroughly rinse your body with warm water from the neck down.  8.  DO NOT shower/wash with your normal soap after using and rinsing off the CHG Soap.                9.  Pat yourself dry with a clean towel.            10.  Wear clean pajamas.            11.  Place clean sheets on your bed the night of your first shower and do not  sleep with  pets. Day of Surgery : Do not apply any lotions/deodorants the morning of surgery.  Please wear clean clothes to the hospital/surgery center.  FAILURE TO FOLLOW THESE INSTRUCTIONS MAY RESULT IN THE CANCELLATION OF YOUR SURGERY  PATIENT SIGNATURE_________________________________  NURSE SIGNATURE__________________________________  ________________________________________________________________________

## 2023-02-19 ENCOUNTER — Other Ambulatory Visit: Payer: Self-pay

## 2023-02-19 ENCOUNTER — Encounter (HOSPITAL_COMMUNITY): Payer: Self-pay

## 2023-02-19 ENCOUNTER — Encounter (HOSPITAL_COMMUNITY)
Admission: RE | Admit: 2023-02-19 | Discharge: 2023-02-19 | Disposition: A | Discharge status: Home / Self Care | Payer: PPO | Source: Ambulatory Visit | Attending: Psychiatry | Admitting: Psychiatry

## 2023-02-19 VITALS — BP 123/56 | HR 59 | Temp 97.4°F | Resp 16 | Ht 66.0 in | Wt 200.0 lb

## 2023-02-19 DIAGNOSIS — D071 Carcinoma in situ of vulva: Secondary | ICD-10-CM | POA: Insufficient documentation

## 2023-02-19 DIAGNOSIS — Z9889 Other specified postprocedural states: Secondary | ICD-10-CM | POA: Diagnosis not present

## 2023-02-19 DIAGNOSIS — J449 Chronic obstructive pulmonary disease, unspecified: Secondary | ICD-10-CM | POA: Insufficient documentation

## 2023-02-19 DIAGNOSIS — I4510 Unspecified right bundle-branch block: Secondary | ICD-10-CM | POA: Diagnosis not present

## 2023-02-19 DIAGNOSIS — I2510 Atherosclerotic heart disease of native coronary artery without angina pectoris: Secondary | ICD-10-CM | POA: Insufficient documentation

## 2023-02-19 DIAGNOSIS — Z79899 Other long term (current) drug therapy: Secondary | ICD-10-CM | POA: Diagnosis not present

## 2023-02-19 DIAGNOSIS — I10 Essential (primary) hypertension: Secondary | ICD-10-CM | POA: Diagnosis not present

## 2023-02-19 DIAGNOSIS — G35 Multiple sclerosis: Secondary | ICD-10-CM | POA: Insufficient documentation

## 2023-02-19 DIAGNOSIS — Z01818 Encounter for other preprocedural examination: Secondary | ICD-10-CM | POA: Diagnosis present

## 2023-02-19 DIAGNOSIS — Z01812 Encounter for preprocedural laboratory examination: Secondary | ICD-10-CM | POA: Diagnosis not present

## 2023-02-19 HISTORY — DX: Hyperlipidemia, unspecified: E78.5

## 2023-02-19 HISTORY — DX: Personal history of other diseases of the digestive system: Z87.19

## 2023-02-19 HISTORY — DX: Unspecified right bundle-branch block: I45.10

## 2023-02-19 HISTORY — DX: Anxiety disorder, unspecified: F41.9

## 2023-02-19 LAB — BASIC METABOLIC PANEL
Anion gap: 9 (ref 5–15)
BUN: 12 mg/dL (ref 8–23)
CO2: 24 mmol/L (ref 22–32)
Calcium: 9.3 mg/dL (ref 8.9–10.3)
Chloride: 99 mmol/L (ref 98–111)
Creatinine, Ser: 1.04 mg/dL — ABNORMAL HIGH (ref 0.44–1.00)
GFR, Estimated: 57 mL/min — ABNORMAL LOW (ref >60)
Glucose, Bld: 109 mg/dL — ABNORMAL HIGH (ref 70–99)
Potassium: 4.6 mmol/L (ref 3.5–5.1)
Sodium: 132 mmol/L — ABNORMAL LOW (ref 135–145)

## 2023-02-19 LAB — CBC
HCT: 38.7 % (ref 36.0–46.0)
Hemoglobin: 12.3 g/dL (ref 12.0–15.0)
MCH: 29.1 pg (ref 26.0–34.0)
MCHC: 31.8 g/dL (ref 30.0–36.0)
MCV: 91.7 fL (ref 80.0–100.0)
Platelets: 149 10*3/uL — ABNORMAL LOW (ref 150–400)
RBC: 4.22 MIL/uL (ref 3.87–5.11)
RDW: 12.8 % (ref 11.5–15.5)
WBC: 5.8 10*3/uL (ref 4.0–10.5)
nRBC: 0 % (ref 0.0–0.2)

## 2023-02-19 NOTE — Progress Notes (Addendum)
PCP - Dr. Zola Button LOV 11-21-22 Cardiologist - Dr. [name] Bernadene Person , NP Clearance appt. Scheduled for 02-22-23 1040 am  PPM/ICD -  Device Orders -  Rep Notified -   Chest x-ray - CT Chest 2023 EKG - 09-19-22 epic Stress Test - 09-28-22 epic ECHO - 2023 epic Cardiac Cath - 2021 Abdominal Aortic Endovascular stent graft 10-31-22 epic  Sleep Study -  CPAP -   Fasting Blood Sugar -  Checks Blood Sugar _____ times a day  Blood Thinner Instructions: Aspirin Instructions:  ERAS Protcol -y PRE-SURGERY no drink  COVID vaccine -yes  Activity--Able to complete ADL's without SOB or CP  Anesthesia review: MS, HTN, CAD, COPD, AAA with repair  Patient denies shortness of breath, fever, cough and chest pain at PAT appointment   All instructions explained to the patient, with a verbal understanding of the material. Patient agrees to go over the instructions while at home for a better understanding. Patient also instructed to self quarantine after being tested for COVID-19. The opportunity to ask questions was provided.

## 2023-02-20 NOTE — Progress Notes (Signed)
Anesthesia Chart Review   Case: [id number] Date/Time: 02/27/23 0715   Procedures:      EXAM UNDER ANESTHESIA     VULVECTOMY PARTIAL   Anesthesia type: Choice   Pre-op diagnosis: VIN3   Location: [facility] ROOM 05 / [facility]   Surgeons: [name], MD       DISCUSSION:72 y.o. HTN, COPD, RBBB, AAA s/p repair, nonobstructive CAD, MS, scheduled for partial vulvectomy 02/27/23 with Dr. [name].   Pt seen by cardiology 02/22/2023. Per OV note, "Chart reviewed as part of pre-operative protocol coverage. According to the RCRI, patient has a 0.9% risk of MACE. Patient reports activity equivalent to <4.0 METS. She is limited secondary to back pain and MS. She is slowly increasing her activity by ambulating with a walker and performing light household activities. Patient had a normal, low risk stress test April 2024.     Given past medical history and time since last visit, based on ACC/AHA guidelines, Sonora Mcclenney would be at acceptable risk for the planned procedure without further cardiovascular testing.    Patient was advised that if she develops new symptoms prior to surgery to contact our office to arrange a follow-up appointment.  she verbalized understanding."  Pt was last seen by vascular surgeon 12/06/2022. Per OV note stable at this visit.  Pt will follow up with Dr. Randie Heinz in 1 year with repeat post EVAR duplex.  VS: BP (!) 123/56   Pulse (!) 59   Temp (!) 36.3 C (Oral)   Resp 16   Ht 5\' 6"  (1.676 m)   Wt 90.7 kg   SpO2 100%   BMI 32.28 kg/m   PROVIDERS: [name], DO is PCP   Cardiologist - Dr. [name]  LABS: Labs reviewed: Acceptable for surgery. (all labs ordered are listed, but only abnormal results are displayed)  Labs Reviewed  CBC - Abnormal; Notable for the following components:      Result Value   Platelets 149 (*)    All other components within normal limits  BASIC METABOLIC PANEL - Abnormal; Notable for the following components:   Sodium  132 (*)    Glucose, Bld 109 (*)    Creatinine, Ser 1.04 (*)    GFR, Estimated 57 (*)    All other components within normal limits     IMAGES: CT Angio 11/29/2022 IMPRESSION: 1. Interval endograft repair of infrarenal abdominal aortic aneurysm. No evidence of endoleak. Maximum diameter of the aneurysm sac is 4.9 x 4.1 cm compared to 5.0 x 4.5 cm on prior exam. 2. 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 3. No acute abnormality of the abdomen or pelvis.    EKG:   CV: Myocardial Perfusion 09/28/2022   The study is normal. The study is low risk.   No ST deviation was noted.   Left ventricular function is normal. Nuclear stress EF: 71 %. The left ventricular ejection fraction is hyperdynamic (>65%). End diastolic cavity size is normal.   Prior study available for comparison from 10/04/2021.  Echo 10/18/2021 1. Left ventricular ejection fraction, by estimation, is 70 to 75%. The  left ventricle has hyperdynamic function. The left ventricle has no  regional wall motion abnormalities. Left ventricular diastolic parameters  are consistent with Grade II diastolic  dysfunction (pseudonormalization). Elevated left atrial pressure.   2. Right ventricular systolic function is normal. The right ventricular  size is normal.  3. The mitral valve is normal in structure. No evidence of mitral valve  regurgitation. No evidence of mitral stenosis.   4. The aortic valve is normal in structure. Aortic valve regurgitation is  not visualized. No aortic stenosis is present.   5. The inferior vena cava is normal in size with <50% respiratory  variability, suggesting right atrial pressure of 8 mmHg.  Past Medical History:  Diagnosis Date   AAA (abdominal aortic aneurysm) (HCC)    Allergic rhinitis    Anxiety    Arthritis    back   Chronic hoarseness    Colon polyps    COPD (chronic obstructive pulmonary  disease) (HCC)    Dr. Marchelle Gearing   Coronary artery disease    nonobstructive   Dyslipidemia    Gastric bleed 10/2021   stomach ulcers   Gastritis    GERD (gastroesophageal reflux disease)    History of hiatal hernia    Hyperlipidemia    Hypertension    Hypothyroidism    MS (multiple sclerosis) (HCC)    Neuromuscular disorder (HCC)    MS   Obesity    RBBB    Stomach ulcer from aspirin/ibuprofen-like drugs (NSAID's)     Past Surgical History:  Procedure Laterality Date   ABDOMINAL AORTIC ENDOVASCULAR STENT GRAFT N/A 10/31/2022   Procedure: ABDOMINAL AORTIC ENDOVASCULAR STENT GRAFT;  Surgeon: Maeola Harman, MD;  Location: [facility] OR;  Service: Vascular;  Laterality: N/A;   ABDOMINAL HYSTERECTOMY     Endometriosis   BARIATRIC SURGERY  09/07/2008   lap band   CARPAL TUNNEL RELEASE Bilateral    CATARACT EXTRACTION Right 11/08/2017   CATARACT EXTRACTION Left 11/22/2017   CATARACT EXTRACTION, BILATERAL      Toric lenses for astigmatism   CHOLECYSTECTOMY     ESOPHAGOGASTRODUODENOSCOPY (EGD) WITH PROPOFOL N/A 10/17/2021   Procedure: ESOPHAGOGASTRODUODENOSCOPY (EGD) WITH PROPOFOL;  Surgeon: Willis Modena, MD;  Location[facility] ENDOSCOPY;  Service: Gastroenterology;  Laterality: N/A;   HIATAL HERNIA REPAIR  09/07/2008   LEFT HEART CATH AND CORONARY ANGIOGRAPHY N/A 07/09/2019   Procedure: LEFT HEART CATH AND CORONARY ANGIOGRAPHY;  Surgeon: Lyn Records, MD;  Location: [facility] INVASIVE CV LAB;  Service: Cardiovascular;  Laterality: N/A;   TONSILLECTOMY     ULTRASOUND GUIDANCE FOR VASCULAR ACCESS Bilateral 10/31/2022   Procedure: ULTRASOUND GUIDANCE FOR VASCULAR ACCESS;  Surgeon: Maeola Harman, MD;  Location: [facility] OR;  Service: Vascular;  Laterality: Bilateral;    MEDICATIONS:  acetaminophen (TYLENOL) 500 MG tablet   atorvastatin (LIPITOR) 80 MG tablet   clobetasol ointment (TEMOVATE) 0.05 %   FLUoxetine (PROZAC) 40 MG capsule   gabapentin (NEURONTIN) 300 MG capsule    hydrochlorothiazide (HYDRODIURIL) 25 MG tablet   HYDROcodone-acetaminophen (NORCO/VICODIN) 5-325 MG tablet   Interferon Beta-1b (BETASERON/EXTAVIA) 0.3 MG KIT injection   levothyroxine (SYNTHROID) 88 MCG tablet   losartan (COZAAR) 100 MG tablet   mometasone (NASONEX) 50 MCG/ACT nasal spray   pantoprazole (PROTONIX) 40 MG tablet   Polyethyl Glycol-Propyl Glycol (SYSTANE ULTRA OP)   senna (SENOKOT) 8.6 MG tablet   silver sulfADIAZINE (SILVADENE) 1 % cream   No current facility-administered medications for this encounter.      Jodell Cipro Ward, PA-C [facility] Pre-Surgical Testing [phone]

## 2023-02-21 NOTE — Progress Notes (Unsigned)
Virtual Visit via Telephone Note   Because of Cassandra Rios's co-morbid illnesses, she is at least at moderate risk for complications without adequate follow up.  This format is felt to be most appropriate for this patient at this time.  The patient did not have access to video technology/had technical difficulties with video requiring transitioning to audio format only (telephone).  All issues noted in this document were discussed and addressed.  No physical exam could be performed with this format.  Please refer to the patient's chart for her consent to telehealth for [facility].  Evaluation Performed:  Preoperative cardiovascular risk assessment _____________   Date:  02/21/2023   Patient ID:  Cassandra Rios, MRN [id number] Patient Location:  Home Provider location:   Office  Primary Care Provider:  [name], DO Primary Cardiologist:  None  Chief Complaint / Patient Profile   72 y.o. y/o female with a h/o CAD per angiography, AAA s/p endovascular aortic aneurysm repair May 2024, chronic diastolic heart failure, hypertension, hyperlipidemia, COPD, hypothyroidism, MS who is pending pelvic exam under anesthesia, partial vulvectomy by Dr. Alvester Morin on 02/27/2023 and presents today for telephonic preoperative cardiovascular risk assessment.  History of Present Illness    Cassandra Rios is a 72 y.o. female who presents via audio/video conferencing for a telehealth visit today.  Pt was last seen in cardiology clinic on 09/19/2022 by Robin Searing, NP.  At that time Cassandra Rios was stable from a cardiac standpoint.  Patient underwent nuclear stress testing April 2024 which was a low risk, normal study the patient is now pending procedure as outlined above. Since her last visit, she is doing well. Patient denies shortness of breath, dyspnea on exertion, lower extremity edema, orthopnea or PND. No chest pain, pressure, or tightness. No  palpitations. Her activity is limited secondary to back pain and MS. She is starting to increase activity by walking with walker and performing light household activities. Given negative stress testing in April 2024, no further cardiac testing is required prior to surgery.   Past Medical History    Past Medical History:  Diagnosis Date   AAA (abdominal aortic aneurysm) (HCC)    Allergic rhinitis    Anxiety    Arthritis    back   Chronic hoarseness    Colon polyps    COPD (chronic obstructive pulmonary disease) (HCC)    Dr. Marchelle Gearing   Coronary artery disease    nonobstructive   Dyslipidemia    Gastric bleed 10/2021   stomach ulcers   Gastritis    GERD (gastroesophageal reflux disease)    History of hiatal hernia    Hyperlipidemia    Hypertension    Hypothyroidism    MS (multiple sclerosis) (HCC)    Neuromuscular disorder (HCC)    MS   Obesity    RBBB    Stomach ulcer from aspirin/ibuprofen-like drugs (NSAID's)    Past Surgical History:  Procedure Laterality Date   ABDOMINAL AORTIC ENDOVASCULAR STENT GRAFT N/A 10/31/2022   Procedure: ABDOMINAL AORTIC ENDOVASCULAR STENT GRAFT;  Surgeon: Maeola Harman, MD;  Location: [facility] OR;  Service: Vascular;  Laterality: N/A;   ABDOMINAL HYSTERECTOMY     Endometriosis   BARIATRIC SURGERY  09/07/2008   lap band   CARPAL TUNNEL RELEASE Bilateral    CATARACT EXTRACTION Right 11/08/2017   CATARACT EXTRACTION Left 11/22/2017   CATARACT EXTRACTION, BILATERAL      Toric lenses for  astigmatism   CHOLECYSTECTOMY     ESOPHAGOGASTRODUODENOSCOPY (EGD) WITH PROPOFOL N/A 10/17/2021   Procedure: ESOPHAGOGASTRODUODENOSCOPY (EGD) WITH PROPOFOL;  Surgeon: Willis Modena, MD;  Location: [facility] ENDOSCOPY;  Service: Gastroenterology;  Laterality: N/A;   HIATAL HERNIA REPAIR  09/07/2008   LEFT HEART CATH AND CORONARY ANGIOGRAPHY N/A 07/09/2019   Procedure: LEFT HEART CATH AND CORONARY ANGIOGRAPHY;  Surgeon: Lyn Records, MD;  [facility]: [facility]  INVASIVE CV LAB;  Service: Cardiovascular;  Laterality: N/A;   TONSILLECTOMY     ULTRASOUND GUIDANCE FOR VASCULAR ACCESS Bilateral 10/31/2022   Procedure: ULTRASOUND GUIDANCE FOR VASCULAR ACCESS;  Surgeon: Maeola Harman, MD;  Location: [facility] OR;  Service: Vascular;  Laterality: Bilateral;    Allergies  Allergies  Allergen Reactions   Codeine Itching and Nausea And Vomiting   Hydromorphone Hcl Nausea And Vomiting   Ibuprofen Other (See Comments)    stomach ulcers   Morphine Sulfate     "makes my blood pressure bottom out"   Metronidazole Hives, Nausea And Vomiting and Rash    Home Medications    Prior to Admission medications   Medication Sig Start Date End Date Taking? Authorizing Provider  acetaminophen (TYLENOL) 500 MG tablet Take 500-1,000 mg by mouth every 6 (six) hours as needed for moderate pain or headache.    [provider]  atorvastatin (LIPITOR) 80 MG tablet Take 1 tablet (80 mg total) by mouth daily. 10/20/22   Gaston Islam., NP  clobetasol ointment (TEMOVATE) 0.05 % Apply 1 Application topically 2 (two) times daily as needed (lichen sclerosus). 11/22/21   [provider]  FLUoxetine (PROZAC) 40 MG capsule Take 1 capsule (40 mg total) by mouth daily. 11/21/22   [name], DO  gabapentin (NEURONTIN) 300 MG capsule Take 600 mg by mouth every evening.  09/19/18   [provider]  hydrochlorothiazide (HYDRODIURIL) 25 MG tablet Take 1 tablet (25 mg total) by mouth daily. 11/21/22   [name], DO  HYDROcodone-acetaminophen (NORCO/VICODIN) 5-325 MG tablet Take 0.5 tablets by mouth every 6 (six) hours as needed for severe pain.    [provider]  Interferon Beta-1b (BETASERON/EXTAVIA) 0.3 MG KIT injection Inject 0.25 mg into the skin every other day. 05/04/22   Butch Penny, NP  levothyroxine (SYNTHROID) 88 MCG tablet TAKE 1 TABLET BY MOUTH EVERY DAY 01/17/23   Zola Button, Grayling Congress, DO  losartan (COZAAR) 100  MG tablet Take 1 tablet (100 mg total) by mouth daily. 11/21/22   Seabron Spates R, DO  mometasone (NASONEX) 50 MCG/ACT nasal spray Place 2 sprays into the nose daily as needed (allergies).    [provider]  pantoprazole (PROTONIX) 40 MG tablet Take 40 mg by mouth daily. 10/21/21   [provider]  Polyethyl Glycol-Propyl Glycol (SYSTANE ULTRA OP) Place 1 drop into the right eye daily as needed (dry eye).     [provider]  senna (SENOKOT) 8.6 MG tablet Take 2 tablets by mouth at bedtime.    [provider]  silver sulfADIAZINE (SILVADENE) 1 % cream Apply 1 application. topically daily as needed (irritated areas under abdomen). 09/03/20   [provider]    Physical Exam    Vital Signs:  Cassandra Rios does not have vital signs available for review today.  Given telephonic nature of communication, physical exam is limited. AAOx3. NAD. Normal affect.  Speech and respirations are unlabored.  Accessory Clinical Findings    None  Assessment &  Plan    Primary Cardiologist: None  Preoperative cardiovascular risk assessment.  Pelvic exam under anesthesia, partial vulvectomy by Dr. Alvester Morin on 02/27/2023.  Chart reviewed as part of pre-operative protocol coverage. According to the RCRI, patient has a 0.9% risk of MACE. Patient reports activity equivalent to <4.0 METS. She is limited secondary to back pain and MS. She is slowly increasing her activity by ambulating with a walker and performing light household activities. Patient had a normal, low risk stress test April 2024.    Given past medical history and time since last visit, based on ACC/AHA guidelines, Cassandra Rios would be at acceptable risk for the planned procedure without further cardiovascular testing.   Patient was advised that if she develops new symptoms prior to surgery to contact our office to arrange a follow-up appointment.  she verbalized understanding.  I will route  this recommendation to the requesting party via Epic fax function.  Please call with questions.  Time:   Today, I have spent 6 minutes with the patient with telehealth technology discussing medical history, symptoms, and management plan.     Cassandra Levering, NP  02/21/2023, 4:01 PM

## 2023-02-22 ENCOUNTER — Ambulatory Visit: Payer: PPO | Attending: Cardiovascular Disease | Admitting: Student

## 2023-02-22 DIAGNOSIS — Z01810 Encounter for preprocedural cardiovascular examination: Secondary | ICD-10-CM | POA: Diagnosis not present

## 2023-02-22 NOTE — Anesthesia Preprocedure Evaluation (Addendum)
Anesthesia Evaluation  Patient identified by MRN, date of birth, ID band Patient awake    Reviewed: Allergy & Precautions, NPO status , Patient's Chart, lab work & pertinent test results  Airway Mallampati: II  TM Distance: >3 FB Neck ROM: Full    Dental no notable dental hx. (+) Teeth Intact, Dental Advisory Given   Pulmonary COPD, former smoker   Pulmonary exam normal breath sounds clear to auscultation       Cardiovascular hypertension, + CAD and + Peripheral Vascular Disease  Normal cardiovascular exam+ dysrhythmias (RBBB)  Rhythm:Regular Rate:Normal  Myocardial Perfusion 09/28/2022   The study is normal. The study is low risk.   No ST deviation was noted.   Left ventricular function is normal. Nuclear stress EF: 71 %. The left ventricular ejection fraction is hyperdynamic (>65%). End diastolic cavity size is normal.   Prior study available for comparison from 10/04/2021.    Neuro/Psych MS sx stable generalized weakness  Neuromuscular disease    GI/Hepatic hiatal hernia,GERD  ,,  Endo/Other  Hypothyroidism    Renal/GU Lab Results      Component                Value               Date                                K                        4.6                 02/19/2023                     CREATININE               1.04 (H)            02/19/2023                     Musculoskeletal  (+) Arthritis ,    Abdominal   Peds  Hematology Lab Results      Component                Value               Date                      WBC                      5.8                 02/19/2023                HGB                      12.3                02/19/2023                HCT                      38.7                02/19/2023  MCV                      91.7                02/19/2023                PLT                      149 (L)             02/19/2023              Anesthesia Other Findings    Reproductive/Obstetrics                             Anesthesia Physical Anesthesia Plan  ASA: 3  Anesthesia Plan: General   Post-op Pain Management: Tylenol PO (pre-op)* and Ketamine IV*   Induction: Intravenous  PONV Risk Score and Plan: 3 and Treatment may vary due to age or medical condition, Ondansetron, Midazolam and Dexamethasone  Airway Management Planned: Oral ETT  Additional Equipment: None  Intra-op Plan:   Post-operative Plan: Extubation in OR  Informed Consent: I have reviewed the patients History and Physical, chart, labs and discussed the procedure including the risks, benefits and alternatives for the proposed anesthesia with the patient or authorized representative who has indicated his/her understanding and acceptance.     Dental advisory given  Plan Discussed with:   Anesthesia Plan Comments: (See PAT note 02/19/2023)       Anesthesia Quick Evaluation

## 2023-02-26 ENCOUNTER — Telehealth: Payer: Self-pay | Admitting: *Deleted

## 2023-02-26 NOTE — Telephone Encounter (Signed)
Telephone call to check on pre-operative status.  Patient compliant with pre-operative instructions.  Reinforced nothing to eat after midnight. Clear liquids until 0400. Patient to arrive at 0500.  No questions or concerns voiced.  Instructed to call for any needs.

## 2023-02-27 ENCOUNTER — Ambulatory Visit (HOSPITAL_COMMUNITY)
Admission: RE | Admit: 2023-02-27 | Discharge: 2023-02-27 | Disposition: A | Discharge status: Home / Self Care | Payer: PPO | Source: Ambulatory Visit | Attending: Psychiatry | Admitting: Psychiatry

## 2023-02-27 ENCOUNTER — Ambulatory Visit (HOSPITAL_COMMUNITY): Payer: PPO | Admitting: Physician Assistant

## 2023-02-27 ENCOUNTER — Encounter (HOSPITAL_COMMUNITY)
Admission: RE | Disposition: A | Discharge status: Home / Self Care | Payer: Self-pay | Source: Ambulatory Visit | Attending: Psychiatry

## 2023-02-27 ENCOUNTER — Ambulatory Visit (HOSPITAL_BASED_OUTPATIENT_CLINIC_OR_DEPARTMENT_OTHER): Payer: PPO | Admitting: Registered Nurse

## 2023-02-27 ENCOUNTER — Encounter (HOSPITAL_COMMUNITY): Payer: Self-pay | Admitting: Psychiatry

## 2023-02-27 ENCOUNTER — Other Ambulatory Visit: Payer: Self-pay

## 2023-02-27 DIAGNOSIS — K219 Gastro-esophageal reflux disease without esophagitis: Secondary | ICD-10-CM | POA: Insufficient documentation

## 2023-02-27 DIAGNOSIS — D071 Carcinoma in situ of vulva: Secondary | ICD-10-CM | POA: Diagnosis not present

## 2023-02-27 DIAGNOSIS — I2510 Atherosclerotic heart disease of native coronary artery without angina pectoris: Secondary | ICD-10-CM | POA: Diagnosis not present

## 2023-02-27 DIAGNOSIS — E039 Hypothyroidism, unspecified: Secondary | ICD-10-CM | POA: Diagnosis not present

## 2023-02-27 DIAGNOSIS — K449 Diaphragmatic hernia without obstruction or gangrene: Secondary | ICD-10-CM | POA: Diagnosis not present

## 2023-02-27 DIAGNOSIS — I110 Hypertensive heart disease with heart failure: Secondary | ICD-10-CM | POA: Diagnosis not present

## 2023-02-27 DIAGNOSIS — J449 Chronic obstructive pulmonary disease, unspecified: Secondary | ICD-10-CM | POA: Diagnosis not present

## 2023-02-27 DIAGNOSIS — I714 Abdominal aortic aneurysm, without rupture, unspecified: Secondary | ICD-10-CM | POA: Insufficient documentation

## 2023-02-27 DIAGNOSIS — I739 Peripheral vascular disease, unspecified: Secondary | ICD-10-CM | POA: Insufficient documentation

## 2023-02-27 DIAGNOSIS — I10 Essential (primary) hypertension: Secondary | ICD-10-CM | POA: Insufficient documentation

## 2023-02-27 DIAGNOSIS — Z87891 Personal history of nicotine dependence: Secondary | ICD-10-CM | POA: Diagnosis not present

## 2023-02-27 DIAGNOSIS — G35 Multiple sclerosis: Secondary | ICD-10-CM | POA: Diagnosis not present

## 2023-02-27 DIAGNOSIS — D069 Carcinoma in situ of cervix, unspecified: Secondary | ICD-10-CM

## 2023-02-27 DIAGNOSIS — I5030 Unspecified diastolic (congestive) heart failure: Secondary | ICD-10-CM | POA: Diagnosis not present

## 2023-02-27 DIAGNOSIS — C519 Malignant neoplasm of vulva, unspecified: Secondary | ICD-10-CM | POA: Diagnosis not present

## 2023-02-27 HISTORY — PX: VULVECTOMY PARTIAL: SHX6187

## 2023-02-27 SURGERY — EXAM UNDER ANESTHESIA
Anesthesia: General

## 2023-02-27 MED ORDER — DEXAMETHASONE SODIUM PHOSPHATE 10 MG/ML IJ SOLN
INTRAMUSCULAR | Status: DC | PRN
  Administered 2023-02-27: 5 mg via INTRAVENOUS

## 2023-02-27 MED ORDER — MONSELS FERRIC SUBSULFATE EX SOLN
CUTANEOUS | Status: AC
  Filled 2023-02-27: qty 8

## 2023-02-27 MED ORDER — ONDANSETRON HCL 4 MG/2ML IJ SOLN
INTRAMUSCULAR | Status: DC | PRN
Start: 2023-02-27 — End: 2023-02-27
  Administered 2023-02-27: 4 mg via INTRAVENOUS

## 2023-02-27 MED ORDER — SILVER SULFADIAZINE 1 % EX CREA
1.0000 | TOPICAL_CREAM | Freq: Every day | CUTANEOUS | Status: DC
  Administered 2023-02-27: 1 via TOPICAL
  Filled 2023-02-27: qty 50

## 2023-02-27 MED ORDER — ORAL CARE MOUTH RINSE: 15.0000 mL | Freq: Once | OROMUCOSAL | Status: AC

## 2023-02-27 MED ORDER — LIDOCAINE HCL (PF) 2 % IJ SOLN
INTRAMUSCULAR | Status: AC
  Filled 2023-02-27: qty 5

## 2023-02-27 MED ORDER — PROPOFOL 10 MG/ML IV BOLUS
INTRAVENOUS | Status: DC | PRN
  Administered 2023-02-27: 130 mg via INTRAVENOUS

## 2023-02-27 MED ORDER — 0.9 % SODIUM CHLORIDE (POUR BTL) OPTIME
TOPICAL | Status: DC | PRN
  Administered 2023-02-27: 1000 mL

## 2023-02-27 MED ORDER — LIDOCAINE 2% (20 MG/ML) 5 ML SYRINGE
INTRAMUSCULAR | Status: DC | PRN
  Administered 2023-02-27: 100 mg via INTRAVENOUS

## 2023-02-27 MED ORDER — ACETAMINOPHEN 500 MG PO TABS: 1000.0000 mg | ORAL_TABLET | ORAL | Status: AC

## 2023-02-27 MED ORDER — FENTANYL CITRATE (PF) 100 MCG/2ML IJ SOLN
INTRAMUSCULAR | Status: DC | PRN
  Administered 2023-02-27: 100 ug via INTRAVENOUS

## 2023-02-27 MED ORDER — ROCURONIUM BROMIDE 10 MG/ML (PF) SYRINGE
PREFILLED_SYRINGE | INTRAVENOUS | Status: AC
  Filled 2023-02-27: qty 10

## 2023-02-27 MED ORDER — EPHEDRINE SULFATE-NACL 50-0.9 MG/10ML-% IV SOSY
PREFILLED_SYRINGE | INTRAVENOUS | Status: DC | PRN
  Administered 2023-02-27 (×2): 5 mg via INTRAVENOUS

## 2023-02-27 MED ORDER — FENTANYL CITRATE (PF) 100 MCG/2ML IJ SOLN
INTRAMUSCULAR | Status: AC
  Filled 2023-02-27: qty 2

## 2023-02-27 MED ORDER — SUGAMMADEX SODIUM 200 MG/2ML IV SOLN
INTRAVENOUS | Status: DC | PRN
  Administered 2023-02-27: 200 mg via INTRAVENOUS

## 2023-02-27 MED ORDER — ENOXAPARIN SODIUM 40 MG/0.4ML IJ SOSY: 40.0000 mg | PREFILLED_SYRINGE | INTRAMUSCULAR | Status: AC

## 2023-02-27 MED ORDER — DEXAMETHASONE SODIUM PHOSPHATE 4 MG/ML IJ SOLN: 4.0000 mg | INTRAMUSCULAR | Status: DC

## 2023-02-27 MED ORDER — BUPIVACAINE HCL (PF) 0.25 % IJ SOLN
INTRAMUSCULAR | Status: AC
  Filled 2023-02-27: qty 30

## 2023-02-27 MED ORDER — ACETIC ACID 5 % SOLN
Status: AC
  Filled 2023-02-27: qty 50

## 2023-02-27 MED ORDER — EPHEDRINE 5 MG/ML INJ
INTRAVENOUS | Status: AC
  Filled 2023-02-27: qty 5

## 2023-02-27 MED ORDER — PROPOFOL 10 MG/ML IV BOLUS
INTRAVENOUS | Status: AC
  Filled 2023-02-27: qty 20

## 2023-02-27 MED ORDER — BUPIVACAINE HCL (PF) 0.25 % IJ SOLN
INTRAMUSCULAR | Status: DC | PRN
Start: 2023-02-27 — End: 2023-02-27
  Administered 2023-02-27: 20 mL

## 2023-02-27 MED ORDER — ACETAMINOPHEN 500 MG PO TABS
ORAL_TABLET | ORAL | Status: AC
  Administered 2023-02-27: 1000 mg via ORAL
  Filled 2023-02-27: qty 2

## 2023-02-27 MED ORDER — ACETAMINOPHEN 500 MG PO TABS: 1000.0000 mg | ORAL_TABLET | Freq: Once | ORAL | Status: DC

## 2023-02-27 MED ORDER — ACETIC ACID 5 % SOLN
Status: DC | PRN
  Administered 2023-02-27: 1 via TOPICAL

## 2023-02-27 MED ORDER — CHLORHEXIDINE GLUCONATE 0.12 % MT SOLN
15.0000 mL | Freq: Once | OROMUCOSAL | Status: AC
  Administered 2023-02-27: 15 mL via OROMUCOSAL

## 2023-02-27 MED ORDER — ONDANSETRON HCL 4 MG/2ML IJ SOLN
INTRAMUSCULAR | Status: AC
  Filled 2023-02-27: qty 2

## 2023-02-27 MED ORDER — LACTATED RINGERS IV SOLN: INTRAVENOUS | Status: DC

## 2023-02-27 MED ORDER — LIDOCAINE HCL (PF) 1 % IJ SOLN
INTRAMUSCULAR | Status: AC
  Filled 2023-02-27: qty 30

## 2023-02-27 MED ORDER — MIDAZOLAM HCL 5 MG/5ML IJ SOLN
INTRAMUSCULAR | Status: DC | PRN
  Administered 2023-02-27: 1 mg via INTRAVENOUS

## 2023-02-27 MED ORDER — ENOXAPARIN SODIUM 40 MG/0.4ML IJ SOSY
PREFILLED_SYRINGE | INTRAMUSCULAR | Status: AC
  Administered 2023-02-27: 40 mg via SUBCUTANEOUS
  Filled 2023-02-27: qty 0.4

## 2023-02-27 MED ORDER — MIDAZOLAM HCL 2 MG/2ML IJ SOLN
INTRAMUSCULAR | Status: AC
  Filled 2023-02-27: qty 2

## 2023-02-27 MED ORDER — ROCURONIUM BROMIDE 10 MG/ML (PF) SYRINGE
PREFILLED_SYRINGE | INTRAVENOUS | Status: DC | PRN
  Administered 2023-02-27: 60 mg via INTRAVENOUS

## 2023-02-27 SURGICAL SUPPLY — 35 items
BLADE SURG 15 STRL LF DISP TIS (BLADE) ×2 IMPLANT
BLADE SURG 15 STRL SS (BLADE) ×1
BLADE SURG SZ11 CARB STEEL (BLADE) IMPLANT
BNDG GAUZE DERMACEA FLUFF 4 (GAUZE/BANDAGES/DRESSINGS) IMPLANT
BNDG GZE DERMACEA 4 6PLY (GAUZE/BANDAGES/DRESSINGS)
BRIEF MESH DISP LRG (UNDERPADS AND DIAPERS) IMPLANT
CATH ROBINSON RED A/P 16FR (CATHETERS) ×2 IMPLANT
COVER SURGICAL LIGHT HANDLE (MISCELLANEOUS) ×2 IMPLANT
GAUZE 4X4 16PLY ~~LOC~~+RFID DBL (SPONGE) ×2 IMPLANT
GAUZE PAD ABD 8X10 STRL (GAUZE/BANDAGES/DRESSINGS) IMPLANT
GLOVE BIO SURGEON STRL SZ 6 (GLOVE) ×4 IMPLANT
GLOVE BIOGEL PI IND STRL 6.5 (GLOVE) ×2 IMPLANT
GOWN STRL REUS W/ TWL LRG LVL3 (GOWN DISPOSABLE) ×2 IMPLANT
GOWN STRL REUS W/TWL LRG LVL3 (GOWN DISPOSABLE) ×1
KIT TURNOVER KIT A (KITS) ×2 IMPLANT
NDL HYPO 22X1.5 SAFETY MO (MISCELLANEOUS) ×2 IMPLANT
NDL HYPO 25X1 1.5 SAFETY (NEEDLE) IMPLANT
NEEDLE HYPO 22X1.5 SAFETY MO (MISCELLANEOUS) ×1
NEEDLE HYPO 25X1 1.5 SAFETY (NEEDLE)
PACK PERINEAL COLD (PAD) ×2 IMPLANT
PACK VAGINAL WOMENS (CUSTOM PROCEDURE TRAY) ×2 IMPLANT
PAD TELFA 2X3 NADH STRL (GAUZE/BANDAGES/DRESSINGS) IMPLANT
SUT VIC AB 2-0 CT1 27 (SUTURE) ×1
SUT VIC AB 2-0 CT1 27XBRD (SUTURE) ×2 IMPLANT
SUT VIC AB 2-0 CT1 TAPERPNT 27 (SUTURE) IMPLANT
SUT VIC AB 2-0 SH 27 (SUTURE) ×1
SUT VIC AB 2-0 SH 27X BRD (SUTURE) ×2 IMPLANT
SUT VIC AB 3-0 SH 27 (SUTURE) ×2
SUT VIC AB 3-0 SH 27X BRD (SUTURE) ×2 IMPLANT
SUT VIC AB 4-0 PS2 18 (SUTURE) ×2 IMPLANT
SUT VIC AB 4-0 SH 18 (SUTURE) IMPLANT
SYR BULB IRRIG 60ML STRL (SYRINGE) ×2 IMPLANT
TOWEL OR 17X26 10 PK STRL BLUE (TOWEL DISPOSABLE) ×4 IMPLANT
TRAY FOLEY MTR SLVR 16FR STAT (SET/KITS/TRAYS/PACK) ×2 IMPLANT
WATER STERILE IRR 1000ML POUR (IV SOLUTION) ×2 IMPLANT

## 2023-02-27 NOTE — Anesthesia Postprocedure Evaluation (Signed)
Anesthesia Post Note  Patient: Cassandra Rios  Procedure(s) Performed: EXAM UNDER ANESTHESIA VULVECTOMY PARTIAL     Patient location during evaluation: PACU Anesthesia Type: General Level of consciousness: awake and alert Pain management: pain level controlled Vital Signs Assessment: post-procedure vital signs reviewed and stable Respiratory status: spontaneous breathing, nonlabored ventilation, respiratory function stable and patient connected to nasal cannula oxygen Cardiovascular status: blood pressure returned to baseline and stable Postop Assessment: no apparent nausea or vomiting Anesthetic complications: no   No notable events documented.  Last Vitals:  Vitals:   02/27/23 0900 02/27/23 0915  BP: (!) 165/77 (!) 161/73  Pulse: (!) 57 (!) 58  Resp: 12 14  Temp:  36.4 C  SpO2: 94% 98%    Last Pain:  Vitals:   02/27/23 0915  TempSrc:   PainSc: 0-No pain                 Trevor Iha

## 2023-02-27 NOTE — Discharge Instructions (Signed)
02/27/2023  Return to work: 2-6 weeks if applicable  Activity: 1. Be up and out of the bed during the day.  Take a nap if needed.  You may walk up steps but be careful and use the hand rail.  Stair climbing will tire you more than you think, you may need to stop part way and rest.   2. No lifting or straining over 10 lbs, pushing, pulling, straining for 6 weeks.  3. Do not drive if you are taking narcotic pain medicine. You need to make sure your reaction time has returned and you can brake safely.  4. Shower daily.  Use your regular soap to bathe and when finished pat your incision dry; don't rub.  No tub baths until cleared by your surgeon.   5. No sexual activity and nothing in the vagina for 4 weeks.  6. You may experience a small amount of clear drainage from your incisions, which is normal.  If the drainage persists or increases, please call the office.  7. You may experience vaginal spotting after surgery or around the 6-8 week mark from surgery when the stitches at the top of the vagina begin to dissolve.  The spotting is normal but if you experience heavy bleeding, call our office.  8. Take Tylenol or ibuprofen first for pain and only use narcotic pain medication for severe pain not relieved by the Tylenol or Ibuprofen.  Monitor your Tylenol intake to a max of 4,000 mg.   9. You may use the silvadene cream daily as needed on the vulva on the other areas that were disrupted. Does not need to be applied directly to the sutures.  Diet: 1. Low sodium Heart Healthy Diet is recommended.  2. It is safe to use a laxative, such as Miralax or Colace, if you have difficulty moving your bowels. You can take Sennakot at bedtime every evening to keep bowel movements regular and to prevent constipation.    Wound Care: 1. Keep clean and dry.  Shower daily.  Reasons to call the Doctor: Fever - Oral temperature greater than 100.4 degrees Fahrenheit Foul-smelling vaginal discharge Difficulty  urinating Nausea and vomiting Increased pain at the site of the incision that is unrelieved with pain medicine. Difficulty breathing with or without chest pain New calf pain especially if only on one side Sudden, continuing increased vaginal bleeding with or without clots.   Contacts: For questions or concerns you should contact:  Dr. Alvester Morin at [phone]  Warner Mccreedy, NP at [phone]  After Hours: call [phone] and have the GYN Oncologist paged/contacted

## 2023-02-27 NOTE — Transfer of Care (Signed)
Immediate Anesthesia Transfer of Care Note  Patient: Cassandra Rios  Procedure(s) Performed: EXAM UNDER ANESTHESIA VULVECTOMY PARTIAL  Patient Location: PACU  Anesthesia Type:General  Level of Consciousness: awake, alert , oriented, and patient cooperative  Airway & Oxygen Therapy: Patient Spontanous Breathing and Patient connected to face mask oxygen  Post-op Assessment: Report given to RN, Post -op Vital signs reviewed and stable, and Patient moving all extremities  Post vital signs: Reviewed and stable  Last Vitals:  Vitals Value Taken Time  BP 173/74 02/27/23 0839  Temp    Pulse 60 02/27/23 0839  Resp 14 02/27/23 0839  SpO2 90 % 02/27/23 0839  Vitals shown include unfiled device data.  Last Pain:  Vitals:   02/27/23 0616  TempSrc: Oral  PainSc:          Complications: No notable events documented.

## 2023-02-27 NOTE — Op Note (Signed)
GYNECOLOGIC ONCOLOGY OPERATIVE NOTE  Date of Service: 02/27/2023  Preoperative Diagnosis: VIN3  Postoperative Diagnosis: Same  Procedures: Simple partial vulvectomy  Surgeon: [name], MD  Assistants: None  Anesthesia: Choice  Estimated Blood Loss: 15 mL    Fluids: 1000 ml, crystalloid  Urine Output: 300 ml, clear yellow  Findings: External exam with approximate 1.5x1.5cm raised white lesion of the left posterior vulva and posterior fourchette. Acetowhite changes of this area. No additional acetowhite changes noted on remainder of vulva.  Specimens:  ID Type Source Tests Collected by Time Destination  1 : Posterior Vulva- Stitch Marks 12 o'clock Tissue PATH Gyn biopsy SURGICAL PATHOLOGY [name], MD 02/27/2023 [phone]     Complications:  None  Indications for Procedure: Cassandra Rios is a 72 y.o. 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  Prior to the procedure, all risks, benefits, and alternatives were discussed and informed surgical consent was signed.  Procedure: Patient was taken to the operating room where general anesthesia was achieved.  She was positioned in dorsal lithotomy and prepped and draped.   A thorough exam of the vulva was done after placing a moist sponge with acetic acid on the vulva with the findings as noted above. The area to be removed was outlined with a marking pen. A scalpel was used to incise around the lesion. Allis clamps are used to grasp the lesion and a skinning vulvectomy was performed in the standard fashion with aid of cautery. After the lesion was removed, the wound bed was made hemostatic with cautery. Several deep 2-0 vicryl suture was placed to bring the wound edges together followed by 3-0 vicryl interrupted stitches in the deep dermal layer and 4-0 vicryl mattress stitches on the skin edges to re-appoximate the wound. Hemostasis was noted at the end of the procedure.   An in and out catheterization was performed to drain  the bladder. 0.25% marcaine was infiltrated into the incision.  Patient tolerated the procedure well. Sponge, lap, and instrument counts were correct.  No perioperative antibiotics were indicated for this procedure.  Patient was extubated and taken to the PACU in stable condition.  [name], MD Gynecologic Oncology

## 2023-02-27 NOTE — Anesthesia Procedure Notes (Signed)
Procedure Name: Intubation Date/Time: 02/27/2023 7:27 AM  Performed by: Elisabeth Cara, CRNAPre-anesthesia Checklist: Patient identified, Emergency Drugs available, Suction available, Timeout performed and Patient being monitored Patient Re-evaluated:Patient Re-evaluated prior to induction Oxygen Delivery Method: Circle system utilized Preoxygenation: Pre-oxygenation with 100% oxygen Induction Type: IV induction Ventilation: Mask ventilation without difficulty Laryngoscope Size: Mac and 4 Grade View: Grade I Tube type: Oral Tube size: 7.5 mm Number of attempts: 1 Airway Equipment and Method: Stylet Placement Confirmation: ETT inserted through vocal cords under direct vision, positive ETCO2 and breath sounds checked- equal and bilateral Secured at: 21 cm Tube secured with: Tape Dental Injury: Teeth and Oropharynx as per pre-operative assessment

## 2023-02-27 NOTE — Interval H&P Note (Signed)
History and Physical Interval Note:  02/27/2023 7:12 AM  Cassandra Rios  has presented today for surgery, with the diagnosis of VIN3.  The various methods of treatment have been discussed with the patient and family. After consideration of risks, benefits and other options for treatment, the patient has consented to  Procedure(s): EXAM UNDER ANESTHESIA (N/A) VULVECTOMY PARTIAL (N/A) as a surgical intervention.  The patient's history has been reviewed, patient examined, no change in status, stable for surgery.  I have reviewed the patient's chart and labs.  Questions were answered to the patient's satisfaction.     Tykeem Lanzer

## 2023-02-28 ENCOUNTER — Encounter (HOSPITAL_COMMUNITY): Payer: Self-pay | Admitting: Psychiatry

## 2023-02-28 ENCOUNTER — Telehealth: Payer: Self-pay | Admitting: *Deleted

## 2023-02-28 NOTE — Telephone Encounter (Signed)
Spoke with Cassandra Rios this morning. She states she is eating, drinking and urinating well. She has had a BM and is passing gas. She is taking senokot as prescribed and encouraged her to drink plenty of water. She denies fever or chills. Incisions are dry and intact. She rates her pain 4/10. Her pain is controlled with tylenol.    Instructed to call office with any fever, chills, purulent drainage, uncontrolled pain or any other questions or concerns. Patient verbalizes understanding.   Pt aware of post op appointments as well as the office number [phone] and after hours number [phone] to call if she has any questions or concerns

## 2023-03-01 LAB — SURGICAL PATHOLOGY

## 2023-03-08 NOTE — Progress Notes (Addendum)
Established patient visit   Patient: Cassandra Rios   DOB: 11/13/1950   72 y.o. Female  MRN: [id number] Visit Date: 03/09/2023  Today's healthcare provider: Alfredia Ferguson, PA-C   Cc. Cough, congestion, sob  Subjective    HPI Pt reports concerns over 4-5 days of chest congestion, SOB,weakness. Her husband has pneumonia currently. Denies fevers.    Medications: Outpatient Medications Prior to Visit  Medication Sig   acetaminophen (TYLENOL) 500 MG tablet Take 500-1,000 mg by mouth every 6 (six) hours as needed for moderate pain or headache.   atorvastatin (LIPITOR) 80 MG tablet Take 1 tablet (80 mg total) by mouth daily.   FLUoxetine (PROZAC) 40 MG capsule Take 1 capsule (40 mg total) by mouth daily.   gabapentin (NEURONTIN) 300 MG capsule Take 600 mg by mouth every evening.    hydrochlorothiazide (HYDRODIURIL) 25 MG tablet Take 1 tablet (25 mg total) by mouth daily.   HYDROcodone-acetaminophen (NORCO/VICODIN) 5-325 MG tablet Take 0.5 tablets by mouth every 6 (six) hours as needed for severe pain.   Interferon Beta-1b (BETASERON/EXTAVIA) 0.3 MG KIT injection Inject 0.25 mg into the skin every other day.   levothyroxine (SYNTHROID) 88 MCG tablet TAKE 1 TABLET BY MOUTH EVERY DAY   losartan (COZAAR) 100 MG tablet Take 1 tablet (100 mg total) by mouth daily.   mometasone (NASONEX) 50 MCG/ACT nasal spray Place 2 sprays into the nose daily as needed (allergies).   pantoprazole (PROTONIX) 40 MG tablet Take 40 mg by mouth daily.   Polyethyl Glycol-Propyl Glycol (SYSTANE ULTRA OP) Place 1 drop into the right eye daily as needed (dry eye).    senna (SENOKOT) 8.6 MG tablet Take 2 tablets by mouth at bedtime.   silver sulfADIAZINE (SILVADENE) 1 % cream Apply 1 application. topically daily as needed (irritated areas under abdomen).   clobetasol ointment (TEMOVATE) 0.05 % Apply 1 Application topically 2 (two) times daily as needed (lichen sclerosus). (Patient not taking: Reported on  03/09/2023)   No facility-administered medications prior to visit.    Review of Systems  Constitutional:  Positive for fatigue. Negative for fever.  HENT:  Positive for congestion.   Respiratory:  Positive for cough, shortness of breath and wheezing.   Cardiovascular:  Negative for chest pain and leg swelling.  Gastrointestinal:  Negative for abdominal pain.  Neurological:  Positive for weakness. Negative for dizziness and headaches.      Objective    BP 138/84   Pulse 74   Temp 97.9 F (36.6 C) (Oral)   Resp 16   Ht 5\' 6"  (1.676 m)   Wt 196 lb 2 oz (89 kg)   SpO2 (!) 79%   BMI 31.66 kg/m   Physical Exam Constitutional:      General: She is awake.     Appearance: She is well-developed.  HENT:     Head: Normocephalic.  Eyes:     Conjunctiva/sclera: Conjunctivae normal.  Cardiovascular:     Rate and Rhythm: Normal rate and regular rhythm.     Heart sounds: Normal heart sounds.  Pulmonary:     Effort: Pulmonary effort is normal. No respiratory distress.     Breath sounds: Wheezing present.     Comments: Dec breath sounds at bases Skin:    General: Skin is warm.  Neurological:     Mental Status: She is alert and oriented to person, place, and time.  Psychiatric:        Attention and Perception: Attention normal.  Mood and Affect: Mood normal.        Speech: Speech normal.        Behavior: Behavior is cooperative.      Results for orders placed or performed in visit on 03/09/23  POC COVID-19  Result Value Ref Range   SARS Coronavirus 2 Ag Positive (A) Negative    Assessment & Plan     1. Acute cough 2. Hypoxia Poc covid test is a faint positive.  Wheezing, some dec breath sounds-- overall pt is in good spirits, no respiratory distress, but O2 consistently reading 77-79%. Checked with two separate pulse oxes several times-- but given her dispo unsure if accurate Recommending pt go to ER today for cxr, labs  On pulseox -- HR went up to 123 but back  down to 63  - POC COVID-19   I, Alfredia Ferguson, PA-C have reviewed all documentation for this visit. The documentation on  03/09/23   for the exam, diagnosis, procedures, and orders are all accurate and complete.    Alfredia Ferguson, PA-C  [facility] [phone] (phone) [phone] (fax)  [facility]

## 2023-03-09 ENCOUNTER — Encounter: Payer: Self-pay | Admitting: Physician Assistant

## 2023-03-09 ENCOUNTER — Other Ambulatory Visit: Payer: Self-pay

## 2023-03-09 ENCOUNTER — Emergency Department (HOSPITAL_BASED_OUTPATIENT_CLINIC_OR_DEPARTMENT_OTHER): Payer: PPO

## 2023-03-09 ENCOUNTER — Ambulatory Visit (INDEPENDENT_AMBULATORY_CARE_PROVIDER_SITE_OTHER): Payer: PPO | Admitting: Physician Assistant

## 2023-03-09 ENCOUNTER — Emergency Department (HOSPITAL_BASED_OUTPATIENT_CLINIC_OR_DEPARTMENT_OTHER)
Admission: EM | Admit: 2023-03-09 | Discharge: 2023-03-09 | Disposition: A | Discharge status: Home / Self Care | Payer: PPO | Attending: Emergency Medicine | Admitting: Emergency Medicine

## 2023-03-09 ENCOUNTER — Encounter (HOSPITAL_BASED_OUTPATIENT_CLINIC_OR_DEPARTMENT_OTHER): Payer: Self-pay | Admitting: Pediatrics

## 2023-03-09 VITALS — BP 138/84 | HR 74 | Temp 97.9°F | Resp 16 | Ht 66.0 in | Wt 196.1 lb

## 2023-03-09 DIAGNOSIS — I10 Essential (primary) hypertension: Secondary | ICD-10-CM | POA: Insufficient documentation

## 2023-03-09 DIAGNOSIS — J449 Chronic obstructive pulmonary disease, unspecified: Secondary | ICD-10-CM | POA: Diagnosis not present

## 2023-03-09 DIAGNOSIS — I2510 Atherosclerotic heart disease of native coronary artery without angina pectoris: Secondary | ICD-10-CM | POA: Diagnosis not present

## 2023-03-09 DIAGNOSIS — Z79899 Other long term (current) drug therapy: Secondary | ICD-10-CM | POA: Insufficient documentation

## 2023-03-09 DIAGNOSIS — R0602 Shortness of breath: Secondary | ICD-10-CM | POA: Diagnosis present

## 2023-03-09 DIAGNOSIS — R9389 Abnormal findings on diagnostic imaging of other specified body structures: Secondary | ICD-10-CM | POA: Diagnosis not present

## 2023-03-09 DIAGNOSIS — R059 Cough, unspecified: Secondary | ICD-10-CM | POA: Diagnosis not present

## 2023-03-09 DIAGNOSIS — R051 Acute cough: Secondary | ICD-10-CM

## 2023-03-09 DIAGNOSIS — R600 Localized edema: Secondary | ICD-10-CM | POA: Insufficient documentation

## 2023-03-09 DIAGNOSIS — U071 COVID-19: Secondary | ICD-10-CM | POA: Insufficient documentation

## 2023-03-09 DIAGNOSIS — R0902 Hypoxemia: Secondary | ICD-10-CM

## 2023-03-09 DIAGNOSIS — E039 Hypothyroidism, unspecified: Secondary | ICD-10-CM | POA: Diagnosis not present

## 2023-03-09 LAB — POC COVID19 BINAXNOW: SARS Coronavirus 2 Ag: POSITIVE — AB

## 2023-03-09 MED ORDER — ALBUTEROL SULFATE HFA 108 (90 BASE) MCG/ACT IN AERS
2.0000 | INHALATION_SPRAY | RESPIRATORY_TRACT | Status: DC | PRN
  Administered 2023-03-09: 2 via RESPIRATORY_TRACT
  Filled 2023-03-09: qty 6.7

## 2023-03-09 MED ORDER — PREDNISONE 10 MG PO TABS: 20.0000 mg | ORAL_TABLET | Freq: Every day | ORAL | 0 refills | Status: DC

## 2023-03-09 MED ORDER — AZITHROMYCIN 250 MG PO TABS: 250.0000 mg | ORAL_TABLET | Freq: Every day | ORAL | 0 refills | Status: DC

## 2023-03-09 NOTE — ED Notes (Signed)
Seen before triage, SpO2 100% on room air, HR 77, RR 18. Speaking in complete sentences. No resp distress noted.

## 2023-03-09 NOTE — Discharge Instructions (Signed)
Your COVID test is positive and you likely have a component of COPD.  The inhaler may help along with the antibiotics and steroids.  Return for worsening shortness of breath.  Follow-up with your doctor as needed.

## 2023-03-09 NOTE — ED Notes (Signed)
Sp02 98% while ambulating.

## 2023-03-09 NOTE — ED Provider Notes (Signed)
[facility] Provider Note   CSN: [id number] Arrival date & time: 03/09/23  1129     History  Chief Complaint  Patient presents with   Cough    Cassandra Rios is a 72 y.o. female.   Cough Patient presents with shortness of breath and cough.  Has had since the beginning of the week with this being Friday.  Has had cough with green sputum production.  Reported history of COPD from previous smoking.  Went to PCP for exam and found to have sats of 77%.  Patient was well-appearing and reportedly checked on 2 machines and PCP note states they were not sure if it was accurate.  Sent down to the ER and now sats of 100%.  States she does feel fatigued.  Positive COVID test.    Past Medical History:  Diagnosis Date   AAA (abdominal aortic aneurysm) (HCC)    Allergic rhinitis    Anxiety    Arthritis    back   Chronic hoarseness    Colon polyps    COPD (chronic obstructive pulmonary disease) (HCC)    Dr. Marchelle Gearing   Coronary artery disease    nonobstructive   Dyslipidemia    Gastric bleed 10/2021   stomach ulcers   Gastritis    GERD (gastroesophageal reflux disease)    History of hiatal hernia    Hyperlipidemia    Hypertension    Hypothyroidism    MS (multiple sclerosis) (HCC)    Neuromuscular disorder (HCC)    MS   Obesity    RBBB    Stomach ulcer from aspirin/ibuprofen-like drugs (NSAID's)     Home Medications Prior to Admission medications   Medication Sig Start Date End Date Taking? Authorizing Provider  azithromycin (ZITHROMAX) 250 MG tablet Take 1 tablet (250 mg total) by mouth daily. Take first 2 tablets together, then 1 every day until finished. 03/09/23  Yes Benjiman Core, MD  predniSONE (DELTASONE) 10 MG tablet Take 2 tablets (20 mg total) by mouth daily. 03/09/23  Yes Benjiman Core, MD  acetaminophen (TYLENOL) 500 MG tablet Take 500-1,000 mg by mouth every 6 (six) hours as needed for moderate pain or  headache.    [provider]  atorvastatin (LIPITOR) 80 MG tablet Take 1 tablet (80 mg total) by mouth daily. 10/20/22   Gaston Islam., NP  clobetasol ointment (TEMOVATE) 0.05 % Apply 1 Application topically 2 (two) times daily as needed (lichen sclerosus). Patient not taking: Reported on 03/09/2023 11/22/21   [provider]  FLUoxetine (PROZAC) 40 MG capsule Take 1 capsule (40 mg total) by mouth daily. 11/21/22   [name], DO  gabapentin (NEURONTIN) 300 MG capsule Take 600 mg by mouth every evening.  09/19/18   [provider]  hydrochlorothiazide (HYDRODIURIL) 25 MG tablet Take 1 tablet (25 mg total) by mouth daily. 11/21/22   [name], DO  HYDROcodone-acetaminophen (NORCO/VICODIN) 5-325 MG tablet Take 0.5 tablets by mouth every 6 (six) hours as needed for severe pain.    [provider]  Interferon Beta-1b (BETASERON/EXTAVIA) 0.3 MG KIT injection Inject 0.25 mg into the skin every other day. 05/04/22   Butch Penny, NP  levothyroxine (SYNTHROID) 88 MCG tablet TAKE 1 TABLET BY MOUTH EVERY DAY 01/17/23   Zola Button, Grayling Congress, DO  losartan (COZAAR) 100 MG tablet Take 1 tablet (100 mg total) by mouth daily. 11/21/22   [name], DO  mometasone (NASONEX) 50 MCG/ACT nasal spray Place 2 sprays into the nose daily as needed (allergies).    [provider]  pantoprazole (PROTONIX) 40 MG tablet Take 40 mg by mouth daily. 10/21/21   [provider]  Polyethyl Glycol-Propyl Glycol (SYSTANE ULTRA OP) Place 1 drop into the right eye daily as needed (dry eye).     [provider]  senna (SENOKOT) 8.6 MG tablet Take 2 tablets by mouth at bedtime.    [provider]  silver sulfADIAZINE (SILVADENE) 1 % cream Apply 1 application. topically daily as needed (irritated areas under abdomen). 09/03/20   [provider]      Allergies    Codeine, Hydromorphone hcl, Ibuprofen, Morphine sulfate, and  Metronidazole    Review of Systems   Review of Systems  Respiratory:  Positive for cough.     Physical Exam Updated Vital Signs BP 133/65   Pulse (!) 58   Temp 98.3 F (36.8 C)   Resp 18   Ht 5\' 6"  (1.676 m)   Wt 89 kg   SpO2 98%   BMI 31.66 kg/m  Physical Exam Vitals and nursing note reviewed.  Cardiovascular:     Rate and Rhythm: Regular rhythm.  Pulmonary:     Comments: Somewhat harsh breath sounds. Abdominal:     Tenderness: There is no abdominal tenderness.  Musculoskeletal:        General: No tenderness.     Right lower leg: Edema present.     Left lower leg: Edema present.  Skin:    General: Skin is warm.  Neurological:     Mental Status: She is alert and oriented to person, place, and time.     ED Results / Procedures / Treatments   Labs (all labs ordered are listed, but only abnormal results are displayed) Labs Reviewed - No data to display  EKG None  Radiology DG Chest 2 View  Result Date: 03/09/2023 CLINICAL DATA:  cough. EXAM: CHEST - 2 VIEW COMPARISON:  10/17/2021. FINDINGS: Elevated right hemidiaphragm. There is probable associated compressive atelectatic changes in the right lung base. Bilateral lung fields are otherwise clear. No acute consolidation or lung collapse. Bilateral costophrenic angles are clear. Normal cardio-mediastinal silhouette. No acute osseous abnormalities. Partially seen aorto bi-iliac stent. The soft tissues are within normal limits. IMPRESSION: No active cardiopulmonary disease. Electronically Signed   By: Jules Schick M.D.   On: 03/09/2023 14:03    Procedures Procedures    Medications Ordered in ED Medications  albuterol (VENTOLIN HFA) 108 (90 Base) MCG/ACT inhaler 2 puff (2 puffs Inhalation Given 03/09/23 1427)    ED Course/ Medical Decision Making/ A&P                                 Medical Decision Making Amount and/or Complexity of Data Reviewed Radiology: ordered.  Risk Prescription drug  management.   Patient with cough.  COVID-positive.  History of COPD.  Not on oxygen at baseline.  Questionable hypoxia.  Will ambulate to check for hypoxia.  X-ray done and reassuring. I have reviewed PCP note.  Ambulated without hypoxia.  Doubt severe pulmonary embolism or severe COVID.  I think the hypoxia in the PCPs office was likely erroneous.  However with increased sputum production and COPD will treat with antibiotics and steroids.  Inhaler given here.  Discharge home        Final Clinical Impression(s) / ED Diagnoses  Final diagnoses:  Chronic obstructive pulmonary disease, unspecified COPD type (HCC)  COVID-19    Rx / DC Orders ED Discharge Orders          Ordered    azithromycin (ZITHROMAX) 250 MG tablet  Daily        03/09/23 1433    predniSONE (DELTASONE) 10 MG tablet  Daily        03/09/23 1433              Benjiman Core, MD 03/09/23 1436

## 2023-03-09 NOTE — ED Triage Notes (Signed)
Reported tested + for covid today and had a productive cough with green mucous.

## 2023-03-19 ENCOUNTER — Inpatient Hospital Stay: Payer: PPO | Attending: Psychiatry | Admitting: Psychiatry

## 2023-03-19 ENCOUNTER — Encounter: Payer: Self-pay | Admitting: Psychiatry

## 2023-03-19 VITALS — BP 147/67 | HR 62 | Temp 97.5°F | Resp 20 | Wt 197.2 lb

## 2023-03-19 DIAGNOSIS — C519 Malignant neoplasm of vulva, unspecified: Secondary | ICD-10-CM | POA: Insufficient documentation

## 2023-03-19 DIAGNOSIS — Z9079 Acquired absence of other genital organ(s): Secondary | ICD-10-CM | POA: Diagnosis not present

## 2023-03-19 DIAGNOSIS — Z7189 Other specified counseling: Secondary | ICD-10-CM

## 2023-03-19 DIAGNOSIS — D071 Carcinoma in situ of vulva: Secondary | ICD-10-CM

## 2023-03-19 NOTE — Patient Instructions (Signed)
It was a pleasure to see you in clinic today. - Sitz bath one to two times a day. No creams. - Return visit planned for 4 weeks.  Thank you very much for allowing me to provide care for you today.  I appreciate your confidence in choosing our Gynecologic Oncology team at [facility].  If you have any questions about your visit today please call our office or send [name] a MyChart message and we will get back to you as soon as possible.

## 2023-03-19 NOTE — Progress Notes (Signed)
Gynecologic Oncology Return Clinic Visit  Date of Service: 03/19/2023 Referring Provider:  Brandt Loosen, MD [facility] - Dermatology [place]  Assessment & Plan: Cassandra Rios is a 72 y.o. 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  Postop: - Pt recovering well from surgery and healing appropriately postoperatively - Intraoperative findings and pathology results reviewed. - Recommend twice daily sitz bath's to aid in ongoing healing of vulvar wound.  Vulvar cancer: - Reviewed final pathology in detail. -Given microscopic nature and superficial invasion, no additional intervention needed at this time.  Negative margins for malignancy and dysplasia/dVIN. - Reviewed signs/symptoms of recurrence.  - Surveillance reviewed. Follow-up q6 months x2 years then annually.   RTC 4 weeks for wound check.  [name], MD Gynecologic Oncology   Medical Decision Making I personally spent  TOTAL 26 minutes face-to-face and non-face-to-face in the care of this patient, which includes all pre, intra, and post visit time on the date of service. The discussion of diagnosis and management of vulvar cancer is beyond the scope of routine postoperative care.   ----------------------- Reason for Visit: Postop/treatment counseling  Treatment History: Oncology History  Vulvar cancer (HCC)   Initial Diagnosis   Was being followed for lichen sclerosis   12/26/2022 Initial Biopsy   A. VULVA, BIOPSY:               High-grade squamous intraepithelial lesion (VIN-3) Comment    Immunohistochemistry was performed on block A1. The p16 shows block-type positivity. The p53 stain is wild type pattern.      02/27/2023 Surgery   Simple partial vulvectomy   02/27/2023 Initial Diagnosis   Vulvar cancer (HCC)   02/27/2023 Pathology Results   A. VULVA, POSTERIOR, PARTIAL VULVECTOMY:       Superficially invasive squamous cell carcinoma arising in high-grade squamous intraepithelial lesion (VIN-3).      Tumor size: 1 mm (microscopically).      Depth of invasion: 1 mm.      Surgical margins of resection are negative for dysplasia or invasive carcinoma.      See oncology table.  ONCOLOGY TABLE:  VULVA, CARCINOMA: Resection  Procedure: Partial vulvectomy Tumor Focality: Unifocal Tumor Site: Posterior vulva Tumor Size: 1 mm (microscopically) Histologic Type: Invasive squamous cell carcinoma Histologic Grade: G1, well-differentiated Depth of Invasion:      Specify depth of invasion (mm): 1.0 mm Other Tissue/ Organ Involvement: Not applicable Lymphovascular Invasion: Not identified Margins:      Margins Involved by Invasive Carcinoma: All margins negative for invasive carcinoma      Margin Status for HSIL or dVIN: Negative Regional Lymph Nodes: Not applicable (no lymph nodes submitted or found)           Lymph Nodes Examined:                                   [0] Sentinel                                   [0] Non-sentinel                                   [0] Total  Number of Nodes with Metastasis 5 mm or Greater: NA           Number of Nodes with Metastasis Less than 5 mm (excludes isolated tumor cells): NA           Number of Nodes with Isolated Tumor Cells (0.2 mm or less): NA           Additional Lymph Node Findings: None identified Distant Metastasis:      Distant Site(s) Involved: Not applicable Pathologic Stage Classification (pTNM, AJCC 8th Edition): pT1a, pNx Ancillary Studies: Can be performed upon request Representative Tumor Block: A1 Comment(s): Immunohistochemical stain for p16 will be performed and reported in an addendum    02/27/2023 Cancer Staging   Staging form: Vulva, AJCC V9 - Clinical stage from 02/27/2023: FIGO Stage IA (cT1a, cNX, cM0) - Signed by [name], MD on 03/19/2023 Histopathologic type: Squamous cell carcinoma,  NOS Stage prefix: Initial diagnosis Histologic grade (G): G1 Histologic grading system: 3 grade system     Interval History: Pt reports that she is recovering well from surgery. Pain is controlled. She is eating and drinking well. She is voiding without issue and having regular bowel movements. No bleeding but having some brown discharge.   Past Medical/Surgical History: Past Medical History:  Diagnosis Date   AAA (abdominal aortic aneurysm) (HCC)    Allergic rhinitis    Anxiety    Arthritis    back   Chronic hoarseness    Colon polyps    COPD (chronic obstructive pulmonary disease) (HCC)    Dr. Marchelle Gearing   Coronary artery disease    nonobstructive   Dyslipidemia    Gastric bleed 10/2021   stomach ulcers   Gastritis    GERD (gastroesophageal reflux disease)    History of hiatal hernia    Hyperlipidemia    Hypertension    Hypothyroidism    MS (multiple sclerosis) (HCC)    Neuromuscular disorder (HCC)    MS   Obesity    RBBB    Stomach ulcer from aspirin/ibuprofen-like drugs (NSAID's)     Past Surgical History:  Procedure Laterality Date   ABDOMINAL AORTIC ENDOVASCULAR STENT GRAFT N/A 10/31/2022   Procedure: ABDOMINAL AORTIC ENDOVASCULAR STENT GRAFT;  Surgeon: Maeola Harman, MD;  Location: [facility] OR;  Service: Vascular;  Laterality: N/A;   ABDOMINAL HYSTERECTOMY     Endometriosis   BARIATRIC SURGERY  09/07/2008   lap band   CARPAL TUNNEL RELEASE Bilateral    CATARACT EXTRACTION Right 11/08/2017   CATARACT EXTRACTION Left 11/22/2017   CATARACT EXTRACTION, BILATERAL      Toric lenses for astigmatism   CHOLECYSTECTOMY     ESOPHAGOGASTRODUODENOSCOPY (EGD) WITH PROPOFOL N/A 10/17/2021   Procedure: ESOPHAGOGASTRODUODENOSCOPY (EGD) WITH PROPOFOL;  Surgeon: Willis Modena, MD;  Location: [facility] ENDOSCOPY;  Service: Gastroenterology;  Laterality: N/A;   HIATAL HERNIA REPAIR  09/07/2008   LEFT HEART CATH AND CORONARY ANGIOGRAPHY N/A 07/09/2019   Procedure: LEFT  HEART CATH AND CORONARY ANGIOGRAPHY;  Surgeon: Lyn Records, MD;  Location: [facility] INVASIVE CV LAB;  Service: Cardiovascular;  Laterality: N/A;   TONSILLECTOMY     ULTRASOUND GUIDANCE FOR VASCULAR ACCESS Bilateral 10/31/2022   Procedure: ULTRASOUND GUIDANCE FOR VASCULAR ACCESS;  Surgeon: Maeola Harman, MD;  Location: [facility] OR;  Service: Vascular;  Laterality: Bilateral;   VULVECTOMY PARTIAL N/A 02/27/2023   Procedure: VULVECTOMY PARTIAL;  Surgeon: [name], MD;  Location: [facility] ORS;  Service: Gynecology;  Laterality: N/A;    Family History  Problem  Relation Age of Onset   Stroke Mother 74   Heart disease Mother        congenital heart defect?   COPD Father    Heart disease Father    AAA (abdominal aortic aneurysm) Father    COPD Sister    Arthritis Sister    AAA (abdominal aortic aneurysm) Sister    Dementia Sister    Other Sister        brain injury   Multiple sclerosis Neg Hx    Breast cancer Neg Hx    Ovarian cancer Neg Hx    Endometrial cancer Neg Hx    Colon cancer Neg Hx     Social History   Socioeconomic History   Marital status: Married    Spouse name: Casimiro Needle   Number of children: 2   Years of education: 16   Highest education level: Not on file  Occupational History   Occupation: retired  Tobacco Use   Smoking status: Former    Current packs/day: 0.00    Average packs/day: 2.0 packs/day for 40.0 years (80.0 ttl pk-yrs)    Types: Cigarettes    Start date: 04/05/1967    Quit date: 04/05/2007    Years since quitting: 15.9    Passive exposure: Current (Husband)   Smokeless tobacco: Never  Vaping Use   Vaping status: Not on file  Substance and Sexual Activity   Alcohol use: No   Drug use: No   Sexual activity: Not Currently    Partners: Male  Other Topics Concern   Not on file  Social History Narrative   Patient is right handed,reside in home with husband   Social Determinants of Health   Financial Resource Strain: Not on file  Food  Insecurity: No Food Insecurity (11/02/2022)   Hunger Vital Sign    Worried About Running Out of Food in the Last Year: Never true    Ran Out of Food in the Last Year: Never true  Transportation Needs: No Transportation Needs (11/02/2022)   PRAPARE - Administrator, Civil Service (Medical): No    Lack of Transportation (Non-Medical): No  Physical Activity: Inactive (04/19/2018)   Exercise Vital Sign    Days of Exercise per Week: 0 days    Minutes of Exercise per Session: 0 min  Stress: No Stress Concern Present (04/19/2018)   [employer] of Occupational Health - Occupational Stress Questionnaire    Feeling of Stress : Not at all  Social Connections: Not on file    Current Medications:  Current Outpatient Medications:    acetaminophen (TYLENOL) 500 MG tablet, Take 500-1,000 mg by mouth every 6 (six) hours as needed for moderate pain or headache., Disp: , Rfl:    atorvastatin (LIPITOR) 80 MG tablet, Take 1 tablet (80 mg total) by mouth daily., Disp: 90 tablet, Rfl: 3   azithromycin (ZITHROMAX) 250 MG tablet, Take 1 tablet (250 mg total) by mouth daily. Take first 2 tablets together, then 1 every day until finished., Disp: 6 tablet, Rfl: 0   FLUoxetine (PROZAC) 40 MG capsule, Take 1 capsule (40 mg total) by mouth daily., Disp: 90 capsule, Rfl: 3   gabapentin (NEURONTIN) 300 MG capsule, Take 600 mg by mouth every evening. , Disp: , Rfl:    hydrochlorothiazide (HYDRODIURIL) 25 MG tablet, Take 1 tablet (25 mg total) by mouth daily., Disp: 90 tablet, Rfl: 1   HYDROcodone-acetaminophen (NORCO/VICODIN) 5-325 MG tablet, Take 0.5 tablets by mouth every 6 (six) hours as needed for  severe pain., Disp: , Rfl:    Interferon Beta-1b (BETASERON/EXTAVIA) 0.3 MG KIT injection, Inject 0.25 mg into the skin every other day., Disp: 42 each, Rfl: 3   levothyroxine (SYNTHROID) 88 MCG tablet, TAKE 1 TABLET BY MOUTH EVERY DAY, Disp: 90 tablet, Rfl: 0   losartan (COZAAR) 100 MG tablet, Take 1 tablet  (100 mg total) by mouth daily., Disp: 90 tablet, Rfl: 1   mometasone (NASONEX) 50 MCG/ACT nasal spray, Place 2 sprays into the nose daily as needed (allergies)., Disp: , Rfl:    Polyethyl Glycol-Propyl Glycol (SYSTANE ULTRA OP), Place 1 drop into the right eye daily as needed (dry eye). , Disp: , Rfl:    predniSONE (DELTASONE) 10 MG tablet, Take 2 tablets (20 mg total) by mouth daily., Disp: 6 tablet, Rfl: 0   senna (SENOKOT) 8.6 MG tablet, Take 2 tablets by mouth at bedtime., Disp: , Rfl:    silver sulfADIAZINE (SILVADENE) 1 % cream, Apply 1 application. topically daily as needed (irritated areas under abdomen)., Disp: , Rfl:   Review of Symptoms: Complete 10-system review is negative except as above in Interval History.  Physical Exam: BP (!) 147/67 (BP Location: Right Arm)   Pulse 62   Temp (!) 97.5 F (36.4 C) (Oral)   Resp 20   Wt 197 lb 3.2 oz (89.4 kg)   SpO2 100%   BMI 31.83 kg/m  General: Alert, oriented, no acute distress. HEENT: Normocephalic, atraumatic. Neck symmetric without masses. Sclera anicteric.  Chest: Normal work of breathing. Clear to auscultation bilaterally.   Cardiovascular: Regular rate and rhythm, no murmurs. Abdomen: Soft, nontender.   Extremities: Grossly normal range of motion.  Warm, well perfused.  No edema bilaterally. Skin: No rashes or lesions noted. GU: External genitalia with evidence of posterior vulvectomy.  Opening of majority of wound with healthy granulation filling in.  Few stitches removed that were no longer holding any tissue in place.  Wound irrigated to remove superficial exudate.  Silver nitrate applied to some of the granulation tissue. Exam chaperoned by Kimberly Swazil[place], CMA   Laboratory & Radiologic Studies: Surgical pathology (02/27/23): FINAL MICROSCOPIC DIAGNOSIS:  A. VULVA, POSTERIOR, PARTIAL VULVECTOMY:      Superficially invasive squamous cell carcinoma arising in high-grade squamous intraepithelial lesion (VIN-3).       Tumor size: 1 mm (microscopically).      Depth of invasion: 1 mm.      Surgical margins of resection are negative for dysplasia or invasive carcinoma.      See oncology table.  ONCOLOGY TABLE:  VULVA, CARCINOMA: Resection  Procedure: Partial vulvectomy Tumor Focality: Unifocal Tumor Site: Posterior vulva Tumor Size: 1 mm (microscopically) Histologic Type: Invasive squamous cell carcinoma Histologic Grade: G1, well-differentiated Depth of Invasion:      Specify depth of invasion (mm): 1.0 mm Other Tissue/ Organ Involvement: Not applicable Lymphovascular Invasion: Not identified Margins:      Margins Involved by Invasive Carcinoma: All margins negative for invasive carcinoma      Margin Status for HSIL or dVIN: Negative Regional Lymph Nodes: Not applicable (no lymph nodes submitted or found)           Lymph Nodes Examined:                                   [0] Sentinel                                   [  0] Non-sentinel                                   [0] Total           Number of Nodes with Metastasis 5 mm or Greater: NA           Number of Nodes with Metastasis Less than 5 mm (excludes isolated tumor cells): NA           Number of Nodes with Isolated Tumor Cells (0.2 mm or less): NA           Additional Lymph Node Findings: None identified Distant Metastasis:      Distant Site(s) Involved: Not applicable Pathologic Stage Classification (pTNM, AJCC 8th Edition): pT1a, pNx Ancillary Studies: Can be performed upon request Representative Tumor Block: A1 Comment(s): Immunohistochemical stain for p16 will be performed and reported in an addendum (v4.2.0.2)  COMMENT:  The case was peer-reviewed by Dr. Venetia Night who agrees with the diagnosis of superficially invasive squamous cell carcinoma.

## 2023-03-27 DIAGNOSIS — I700 Atherosclerosis of aorta: Secondary | ICD-10-CM | POA: Diagnosis not present

## 2023-03-27 DIAGNOSIS — Z79891 Long term (current) use of opiate analgesic: Secondary | ICD-10-CM | POA: Diagnosis not present

## 2023-03-27 DIAGNOSIS — M5136 Other intervertebral disc degeneration, lumbar region with discogenic back pain only: Secondary | ICD-10-CM | POA: Diagnosis not present

## 2023-04-06 ENCOUNTER — Ambulatory Visit: Payer: PPO | Admitting: Physician Assistant

## 2023-04-08 NOTE — Progress Notes (Unsigned)
Cardiology Office Note:    Date:  04/09/2023  ID:  Cassandra Rios, DOB 10/16/1950, MRN [id number] PCP: Zola Button, Grayling Congress, DO  [facility] Providers Cardiologist:  None       Patient Profile:      Coronary artery disease  CAC score in 2021: 1106 Mod non-obstructive disease by cath in 2021 [facility] 07/09/19: mLAD 60-70, pLCx 70, mRCA 40-45, EF 60 - Med Rx Myoview 10/04/2021: low risk  TTE 10/18/21: EF 70-75, no RWMA, GRII DD, elevated LA pressure, normal RVSF  Myoview 09/28/22: EF 71, normal perfusion, low risk  (HFpEF) heart failure with preserved ejection fraction  Intol to SGLT2i 2/2 dizziness  Abdominal aortic aneurysm S/p EVAR in 10/2022 (Dr. Randie Heinz) Right Bundle Branch Block  Chronic Obstructive Pulmonary Disease  Hypertension  Hyperlipidemia  Hypothyroidism Multiple sclerosis  PUD (NSAIDs) GI bleed 10/2021  Obesity          History of Present Illness:  Discussed the use of AI scribe software for clinical note transcription with the patient, who gave verbal consent to proceed.  Cassandra Rios is a 72 y.o. female who returns for follow up of CAD, CHF. She was last seen in clinic by Robin Searing, NP in 09/2022. She is a prior pt of Dr. Katrinka Blazing.  She is here alone. She reports no current chest discomfort, pain, or pressure. She also denies any shortness of breath, even when climbing stairs, despite using a walker for balance issues. She has not experienced any swelling in her legs or episodes of passing out.      Review of Systems  Gastrointestinal:  Negative for hematochezia and melena.  Genitourinary:  Negative for hematuria.  See HPI     Studies Reviewed:   EKG Interpretation Date/Time:  Monday April 09 2023 11:02:03 EST Ventricular Rate:  54 PR Interval:  148 QRS Duration:  110 QT Interval:  466 QTC Calculation: 441 R Axis:   40  Text Interpretation: Sinus bradycardia Incomplete right bundle branch block No significant change since last tracing  Confirmed by Tereso Newcomer [phone]) on 04/09/2023 11:06:17 AM    Results   LABS - Chart Review Na: 132 (02/19/2023) K: 4.6 (02/19/2023) Cr: 1.04 (02/19/2023) Hb: 12.3 (02/19/2023) TSH: 2.07 (02/19/2023) ALT: 14 (11/21/2022) Total cholesterol: 119 (11/21/2022) Triglycerides: 94 (11/21/2022) LDL: 54 (11/21/2022) HDL: 31.5 (11/21/2022)      Risk Assessment/Calculations:             Physical Exam:   VS:  BP 132/70   Pulse (!) 54   Ht 5\' 6"  (1.676 m)   Wt 195 lb 9.6 oz (88.7 kg)   SpO2 93%   BMI 31.57 kg/m    Wt Readings from Last 3 Encounters:  04/09/23 195 lb 9.6 oz (88.7 kg)  03/19/23 197 lb 3.2 oz (89.4 kg)  03/09/23 196 lb 2 oz (89 kg)    Constitutional:      Appearance: Healthy appearance. Not in distress.  Neck:     Vascular: JVD normal.  Pulmonary:     Breath sounds: Normal breath sounds. No wheezing. No rales.  Cardiovascular:     Normal rate. Regular rhythm.     Murmurs: There is no murmur.  Edema:    Peripheral edema absent.  Abdominal:     Palpations: Abdomen is soft.        Assessment and Plan:   Assessment & Plan Chronic heart failure with preserved ejection fraction (HCC) Volume status stable. She is NYHA  Class 2B. Echocardiogram in May 2023 showed an ejection fraction of 70-75% with moderate systolic dysfunction. Currently on hydrochlorothiazide 25 mg daily. She has a history of hyponatremia, which has been stable. She was intolerant of SGLT2 inhib in the past.  - Continue hydrochlorothiazide 25 mg daily - Continue to monitor sodium levels; consider changing to spironolactone or Lasix if sodium levels worsen - F/u 6 mos Coronary artery disease involving native coronary artery of native heart without angina pectoris Moderate non-obstructive CAD as evidenced by cardiac catheterization in 2021. Currently asymptomatic with no angina. MPI in April 2024 was low risk. She does not take aspirin due to history of gastrointestinal bleeding. She knows to take 4  ASA 81 mg if she develops severe chest pain. Her husband also has NTG on hand at home. - Continue Lipitor 80 mg daily Essential hypertension Blood pressure well-controlled on current medications.  - Continue hydrochlorothiazide 25 mg daily, Losartan 100 mg daily Hyperlipidemia LDL goal <70 LDL optimal.   - Continue Lipitor 80 mg daily Abdominal aortic aneurysm (AAA) without rupture, unspecified part [facility]) s/p EVAR in May 2024.   - Continue follow-up with vascular surgery         Dispo:  Return in about 6 months (around 10/07/2023) for Routine Follow Up, w/ Dr. Anne Fu.  Signed, Tereso Newcomer, PA-C

## 2023-04-09 ENCOUNTER — Encounter: Payer: Self-pay | Admitting: Physician Assistant

## 2023-04-09 ENCOUNTER — Ambulatory Visit: Payer: PPO | Attending: Physician Assistant | Admitting: Physician Assistant

## 2023-04-09 VITALS — BP 132/70 | HR 54 | Ht 66.0 in | Wt 195.6 lb

## 2023-04-09 DIAGNOSIS — I5032 Chronic diastolic (congestive) heart failure: Secondary | ICD-10-CM | POA: Diagnosis not present

## 2023-04-09 DIAGNOSIS — E785 Hyperlipidemia, unspecified: Secondary | ICD-10-CM | POA: Diagnosis not present

## 2023-04-09 DIAGNOSIS — I10 Essential (primary) hypertension: Secondary | ICD-10-CM | POA: Diagnosis not present

## 2023-04-09 DIAGNOSIS — I714 Abdominal aortic aneurysm, without rupture, unspecified: Secondary | ICD-10-CM | POA: Diagnosis not present

## 2023-04-09 DIAGNOSIS — I2510 Atherosclerotic heart disease of native coronary artery without angina pectoris: Secondary | ICD-10-CM | POA: Diagnosis not present

## 2023-04-09 NOTE — Assessment & Plan Note (Signed)
Moderate non-obstructive CAD as evidenced by cardiac catheterization in 2021. Currently asymptomatic with no angina. MPI in April 2024 was low risk. She does not take aspirin due to history of gastrointestinal bleeding. She knows to take 4 ASA 81 mg if she develops severe chest pain. Her husband also has NTG on hand at home. - Continue Lipitor 80 mg daily

## 2023-04-09 NOTE — Assessment & Plan Note (Signed)
LDL optimal.  Continue Lipitor 80 mg daily.

## 2023-04-09 NOTE — Assessment & Plan Note (Signed)
s/p EVAR in May 2024.   - Continue follow-up with vascular surgery

## 2023-04-09 NOTE — Assessment & Plan Note (Addendum)
Volume status stable. She is NYHA Class 2B. Echocardiogram in May 2023 showed an ejection fraction of 70-75% with moderate systolic dysfunction. Currently on hydrochlorothiazide 25 mg daily. She has a history of hyponatremia, which has been stable. She was intolerant of SGLT2 inhib in the past.  - Continue hydrochlorothiazide 25 mg daily - Continue to monitor sodium levels; consider changing to spironolactone or Lasix if sodium levels worsen - F/u 6 mos

## 2023-04-09 NOTE — Assessment & Plan Note (Signed)
Blood pressure well-controlled on current medications.  - Continue hydrochlorothiazide 25 mg daily, Losartan 100 mg daily

## 2023-04-09 NOTE — Patient Instructions (Signed)
Medication Instructions:  Your physician recommends that you continue on your current medications as directed. Please refer to the Current Medication list given to you today.  *If you need a refill on your cardiac medications before your next appointment, please call your pharmacy*   Lab Work: None ordered  If you have labs (blood work) drawn today and your tests are completely normal, you will receive your results only by: MyChart Message (if you have MyChart) OR A paper copy in the mail If you have any lab test that is abnormal or we need to change your treatment, we will call you to review the results.   Testing/Procedures: None ordered  Follow-Up: At [facility], you and your health needs are our priority.  As part of our continuing mission to provide you with exceptional heart care, we have created designated Provider Care Teams.  These Care Teams include your primary Cardiologist (physician) and Advanced Practice Providers (APPs -  Physician Assistants and Nurse Practitioners) who all work together to provide you with the care you need, when you need it.  We recommend signing up for the patient portal called "MyChart".  Sign up information is provided on this After Visit Summary.  MyChart is used to connect with patients for Virtual Visits (Telemedicine).  Patients are able to view lab/test results, encounter notes, upcoming appointments, etc.  Non-urgent messages can be sent to your provider as well.   To learn more about what you can do with MyChart, go to [email or web address].    Your next appointment:   6 month(s)  Provider:   None     Other Instructions   

## 2023-04-12 DIAGNOSIS — J449 Chronic obstructive pulmonary disease, unspecified: Secondary | ICD-10-CM | POA: Diagnosis not present

## 2023-04-12 DIAGNOSIS — F3341 Major depressive disorder, recurrent, in partial remission: Secondary | ICD-10-CM | POA: Diagnosis not present

## 2023-04-12 DIAGNOSIS — I509 Heart failure, unspecified: Secondary | ICD-10-CM | POA: Diagnosis not present

## 2023-04-12 DIAGNOSIS — Z87891 Personal history of nicotine dependence: Secondary | ICD-10-CM | POA: Diagnosis not present

## 2023-04-12 DIAGNOSIS — D692 Other nonthrombocytopenic purpura: Secondary | ICD-10-CM | POA: Diagnosis not present

## 2023-04-16 ENCOUNTER — Other Ambulatory Visit: Payer: Self-pay | Admitting: *Deleted

## 2023-04-16 ENCOUNTER — Inpatient Hospital Stay: Payer: PPO | Attending: Psychiatry | Admitting: Psychiatry

## 2023-04-16 ENCOUNTER — Telehealth: Payer: Self-pay | Admitting: *Deleted

## 2023-04-16 VITALS — BP 138/88 | HR 67 | Temp 97.6°F | Resp 20 | Wt 197.0 lb

## 2023-04-16 DIAGNOSIS — C519 Malignant neoplasm of vulva, unspecified: Secondary | ICD-10-CM

## 2023-04-16 DIAGNOSIS — Z9079 Acquired absence of other genital organ(s): Secondary | ICD-10-CM

## 2023-04-16 MED ORDER — INTERFERON BETA-1B 0.3 MG ~~LOC~~ KIT: 0.2500 mg | PACK | SUBCUTANEOUS | 3 refills | Status: DC

## 2023-04-16 NOTE — Telephone Encounter (Signed)
Betaseron refills faxed to [facility]. Received a receipt of confirmation.

## 2023-04-16 NOTE — Patient Instructions (Signed)
It was a pleasure to see you in clinic today. - Continuing to heal well. Sitz baths as needed if it feels good. - Return visit planned for 1 month  Thank you very much for allowing me to provide care for you today.  I appreciate your confidence in choosing our Gynecologic Oncology team at [facility].  If you have any questions about your visit today please call our office or send [name] a MyChart message and we will get back to you as soon as possible.

## 2023-04-16 NOTE — Progress Notes (Unsigned)
Gynecologic Oncology Return Clinic Visit  Date of Service: 04/16/2023 Referring Provider:  Brandt Loosen, MD [facility] - Dermatology [place]  Assessment & Plan: Cassandra Rios is a 72 y.o. 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  Postop: -Wound now completely filled in but with granulation tissue on the surface.  Area treated with silver nitrate. - Patient is not required to do ongoing sitz bath's but can perform as desired if symptomatically helpful.  Vulvar cancer: - Reviewed final pathology in detail. -Given microscopic nature and superficial invasion, no additional intervention needed at this time.  Negative margins for malignancy and dysplasia/dVIN. - Reviewed signs/symptoms of recurrence.  - Surveillance reviewed. Follow-up q6 months x2 years then annually.   RTC 1 mo for follow-up wound check  [name], MD Gynecologic Oncology  ----------------------- Reason for Visit: Postop follow-up  Treatment History: Oncology History  Vulvar cancer [facility])   Initial Diagnosis   Was being followed for lichen sclerosis   12/26/2022 Initial Biopsy   A. VULVA, BIOPSY:               High-grade squamous intraepithelial lesion (VIN-3) Comment    Immunohistochemistry was performed on block A1. The p16 shows block-type positivity. The p53 stain is wild type pattern.      02/27/2023 Surgery   Simple partial vulvectomy   02/27/2023 Initial Diagnosis   Vulvar cancer (HCC)   02/27/2023 Pathology Results   A. VULVA, POSTERIOR, PARTIAL VULVECTOMY:      Superficially invasive squamous cell carcinoma arising in high-grade squamous intraepithelial lesion (VIN-3).      Tumor size: 1 mm (microscopically).      Depth of invasion: 1 mm.      Surgical margins of resection are negative for dysplasia or invasive carcinoma.      See oncology table.  ONCOLOGY  TABLE:  VULVA, CARCINOMA: Resection  Procedure: Partial vulvectomy Tumor Focality: Unifocal Tumor Site: Posterior vulva Tumor Size: 1 mm (microscopically) Histologic Type: Invasive squamous cell carcinoma Histologic Grade: G1, well-differentiated Depth of Invasion:      Specify depth of invasion (mm): 1.0 mm Other Tissue/ Organ Involvement: Not applicable Lymphovascular Invasion: Not identified Margins:      Margins Involved by Invasive Carcinoma: All margins negative for invasive carcinoma      Margin Status for HSIL or dVIN: Negative Regional Lymph Nodes: Not applicable (no lymph nodes submitted or found)           Lymph Nodes Examined:                                   [0] Sentinel                                   [0] Non-sentinel                                   [0] Total           Number of Nodes with Metastasis 5 mm or Greater: NA           Number of Nodes with Metastasis Less than 5 mm (excludes isolated tumor cells): NA  Number of Nodes with Isolated Tumor Cells (0.2 mm or less): NA           Additional Lymph Node Findings: None identified Distant Metastasis:      Distant Site(s) Involved: Not applicable Pathologic Stage Classification (pTNM, AJCC 8th Edition): pT1a, pNx Ancillary Studies: Can be performed upon request Representative Tumor Block: A1 Comment(s): Immunohistochemical stain for p16 will be performed and reported in an addendum    02/27/2023 Cancer Staging   Staging form: Vulva, AJCC V9 - Clinical stage from 02/27/2023: FIGO Stage IA (cT1a, cNX, cM0) - Signed by [name], MD on 03/19/2023 Histopathologic type: Squamous cell carcinoma, NOS Stage prefix: Initial diagnosis Histologic grade (G): G1 Histologic grading system: 3 grade system     Interval History: Patient reports that she feels that her healing is going well.  She has been doing sitz bath's which have been helpful.  Her discharge and bleeding is stopped.  Does have some  ongoing itching in the area.    Past Medical/Surgical History: Past Medical History:  Diagnosis Date   AAA (abdominal aortic aneurysm) (HCC)    Allergic rhinitis    Anxiety    Arthritis    back   Chronic hoarseness    Colon polyps    COPD (chronic obstructive pulmonary disease) (HCC)    Dr. Marchelle Gearing   Coronary artery disease    nonobstructive   Dyslipidemia    Gastric bleed 10/2021   stomach ulcers   Gastritis    GERD (gastroesophageal reflux disease)    History of hiatal hernia    Hyperlipidemia    Hypertension    Hypothyroidism    MS (multiple sclerosis) (HCC)    Neuromuscular disorder (HCC)    MS   Obesity    RBBB    Stomach ulcer from aspirin/ibuprofen-like drugs (NSAID's)     Past Surgical History:  Procedure Laterality Date   ABDOMINAL AORTIC ENDOVASCULAR STENT GRAFT N/A 10/31/2022   Procedure: ABDOMINAL AORTIC ENDOVASCULAR STENT GRAFT;  Surgeon: Maeola Harman, MD;  Location: [facility] OR;  Service: Vascular;  Laterality: N/A;   ABDOMINAL HYSTERECTOMY     Endometriosis   BARIATRIC SURGERY  09/07/2008   lap band   CARPAL TUNNEL RELEASE Bilateral    CATARACT EXTRACTION Right 11/08/2017   CATARACT EXTRACTION Left 11/22/2017   CATARACT EXTRACTION, BILATERAL      Toric lenses for astigmatism   CHOLECYSTECTOMY     ESOPHAGOGASTRODUODENOSCOPY (EGD) WITH PROPOFOL N/A 10/17/2021   Procedure: ESOPHAGOGASTRODUODENOSCOPY (EGD) WITH PROPOFOL;  Surgeon: Willis Modena, MD;  Location: [facility] ENDOSCOPY;  Service: Gastroenterology;  Laterality: N/A;   HIATAL HERNIA REPAIR  09/07/2008   LEFT HEART CATH AND CORONARY ANGIOGRAPHY N/A 07/09/2019   Procedure: LEFT HEART CATH AND CORONARY ANGIOGRAPHY;  Surgeon: Lyn Records, MD;  Location: [facility] INVASIVE CV LAB;  Service: Cardiovascular;  Laterality: N/A;   TONSILLECTOMY     ULTRASOUND GUIDANCE FOR VASCULAR ACCESS Bilateral 10/31/2022   Procedure: ULTRASOUND GUIDANCE FOR VASCULAR ACCESS;  Surgeon: Maeola Harman, MD;   Location: [facility] OR;  Service: Vascular;  Laterality: Bilateral;   VULVECTOMY PARTIAL N/A 02/27/2023   Procedure: VULVECTOMY PARTIAL;  Surgeon: [name], MD;  Location: [facility] ORS;  Service: Gynecology;  Laterality: N/A;    Family History  Problem Relation Age of Onset   Stroke Mother 40   Heart disease Mother        congenital heart defect?   COPD Father    Heart disease Father    AAA (abdominal aortic  aneurysm) Father    COPD Sister    Arthritis Sister    AAA (abdominal aortic aneurysm) Sister    Dementia Sister    Other Sister        brain injury   Multiple sclerosis Neg Hx    Breast cancer Neg Hx    Ovarian cancer Neg Hx    Endometrial cancer Neg Hx    Colon cancer Neg Hx     Social History   Socioeconomic History   Marital status: Married    Spouse name: Casimiro Needle   Number of children: 2   Years of education: 16   Highest education level: Not on file  Occupational History   Occupation: retired  Tobacco Use   Smoking status: Former    Current packs/day: 0.00    Average packs/day: 2.0 packs/day for 40.0 years (80.0 ttl pk-yrs)    Types: Cigarettes    Start date: 04/05/1967    Quit date: 04/05/2007    Years since quitting: 16.0    Passive exposure: Current (Husband)   Smokeless tobacco: Never  Vaping Use   Vaping status: Not on file  Substance and Sexual Activity   Alcohol use: No   Drug use: No   Sexual activity: Not Currently    Partners: Male  Other Topics Concern   Not on file  Social History Narrative   Patient is right handed,reside in home with husband   Social Determinants of Health   Financial Resource Strain: Not on file  Food Insecurity: No Food Insecurity (11/02/2022)   Hunger Vital Sign    Worried About Running Out of Food in the Last Year: Never true    Ran Out of Food in the Last Year: Never true  Transportation Needs: No Transportation Needs (11/02/2022)   PRAPARE - Administrator, Civil Service (Medical): No    Lack of  Transportation (Non-Medical): No  Physical Activity: Inactive (04/19/2018)   Exercise Vital Sign    Days of Exercise per Week: 0 days    Minutes of Exercise per Session: 0 min  Stress: No Stress Concern Present (04/19/2018)   [employer] of Occupational Health - Occupational Stress Questionnaire    Feeling of Stress : Not at all  Social Connections: Not on file    Current Medications:  Current Outpatient Medications:    acetaminophen (TYLENOL) 500 MG tablet, Take 500-1,000 mg by mouth every 6 (six) hours as needed for moderate pain or headache., Disp: , Rfl:    atorvastatin (LIPITOR) 80 MG tablet, Take 1 tablet (80 mg total) by mouth daily., Disp: 90 tablet, Rfl: 3   FLUoxetine (PROZAC) 40 MG capsule, Take 1 capsule (40 mg total) by mouth daily., Disp: 90 capsule, Rfl: 3   gabapentin (NEURONTIN) 300 MG capsule, Take 600 mg by mouth every evening. , Disp: , Rfl:    hydrochlorothiazide (HYDRODIURIL) 25 MG tablet, Take 1 tablet (25 mg total) by mouth daily., Disp: 90 tablet, Rfl: 1   HYDROcodone-acetaminophen (NORCO/VICODIN) 5-325 MG tablet, Take 0.5 tablets by mouth every 6 (six) hours as needed for severe pain., Disp: , Rfl:    levothyroxine (SYNTHROID) 88 MCG tablet, TAKE 1 TABLET BY MOUTH EVERY DAY, Disp: 90 tablet, Rfl: 0   losartan (COZAAR) 100 MG tablet, Take 1 tablet (100 mg total) by mouth daily., Disp: 90 tablet, Rfl: 1   mometasone (NASONEX) 50 MCG/ACT nasal spray, Place 2 sprays into the nose daily as needed (allergies)., Disp: , Rfl:    Polyethyl Glycol-Propyl  Glycol (SYSTANE ULTRA OP), Place 1 drop into the right eye daily as needed (dry eye). , Disp: , Rfl:    senna (SENOKOT) 8.6 MG tablet, Take 2 tablets by mouth at bedtime., Disp: , Rfl:    silver sulfADIAZINE (SILVADENE) 1 % cream, Apply 1 application. topically daily as needed (irritated areas under abdomen)., Disp: , Rfl:    Interferon Beta-1b (BETASERON/EXTAVIA) 0.3 MG KIT injection, Inject 0.25 mg into the skin  every other day., Disp: 42 each, Rfl: 3  Review of Symptoms: Complete 10-system review is positive for: Back pain, itching, problem walking  Physical Exam: BP 138/88 (BP Location: Left Arm, Patient Position: Sitting) Comment: first was 155/90 rechecked notified rn  Pulse 67   Temp 97.6 F (36.4 C) (Oral)   Resp 20   Wt 197 lb (89.4 kg)   SpO2 100%   BMI 31.80 kg/m  General: Alert, oriented, no acute distress. HEENT: Normocephalic, atraumatic. Neck symmetric without masses. Sclera anicteric.  Chest: Normal work of breathing. Clear to auscultation bilaterally.   Cardiovascular: Regular rate and rhythm, no murmurs. Abdomen: Soft, nontender.   Extremities: Grossly normal range of motion.  Warm, well perfused.  No edema bilaterally. Skin: No rashes or lesions noted. GU: External genitalia with evidence of posterior vulvectomy.  This area has nearly completely healed over but with granulation tissue on the surface.  Silver nitrate applied to granulation tissue. Exam chaperoned by Warner Mccreedy, NP   Laboratory & Radiologic Studies: None

## 2023-04-17 ENCOUNTER — Encounter: Payer: Self-pay | Admitting: Psychiatry

## 2023-05-06 ENCOUNTER — Other Ambulatory Visit: Payer: Self-pay | Admitting: Family Medicine

## 2023-05-06 DIAGNOSIS — K254 Chronic or unspecified gastric ulcer with hemorrhage: Secondary | ICD-10-CM

## 2023-05-08 ENCOUNTER — Telehealth: Payer: Self-pay | Admitting: Adult Health

## 2023-05-08 NOTE — Telephone Encounter (Signed)
At 11:35 pt left a vm asking that [place], [place] calls her re: Betaseron pt assistance forms that need completing each year.  Pt is asking to discuss this annual request with Andrey [place], [place]

## 2023-05-09 NOTE — Telephone Encounter (Signed)
I called pt back.  It is now the time that we do the re enrollment to PAP for her betaseron.  Will start this and proceed.  Pt to bring forms / to office.

## 2023-05-14 ENCOUNTER — Inpatient Hospital Stay: Payer: PPO | Attending: Psychiatry | Admitting: Psychiatry

## 2023-05-14 VITALS — BP 139/79 | HR 79 | Temp 98.7°F | Resp 19 | Wt 196.0 lb

## 2023-05-14 DIAGNOSIS — C519 Malignant neoplasm of vulva, unspecified: Secondary | ICD-10-CM

## 2023-05-14 DIAGNOSIS — Z9079 Acquired absence of other genital organ(s): Secondary | ICD-10-CM

## 2023-05-14 NOTE — Patient Instructions (Signed)
It was a pleasure to see you in clinic today. - You have healed well - Return visit planned for 6 month  Thank you very much for allowing me to provide care for you today.  I appreciate your confidence in choosing our Gynecologic Oncology team at [facility].  If you have any questions about your visit today please call our office or send [name] a MyChart message and we will get back to you as soon as possible.

## 2023-05-14 NOTE — Progress Notes (Signed)
Gynecologic Oncology Return Clinic Visit  Date of Service: 05/14/2023 Referring Provider:  Brandt Loosen, MD [facility] - Dermatology [place]  Assessment & Plan: Cassandra Rios is a 72 y.o. 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  Postop: -Wound healing well.  Very small residual area of granulation tissue treated with silver nitrate. - Patient is not required to do ongoing sitz bath's but can perform as desired if symptomatically helpful.  Vulvar cancer: -Pathology previously reviewed. -Given microscopic nature and superficial invasion, no additional intervention needed at this time.  Negative margins for malignancy and dysplasia/dVIN. - Reviewed signs/symptoms of recurrence.  - Surveillance reviewed. Follow-up q6 months x2 years then annually.   RTC 6 month  [name], MD Gynecologic Oncology  ----------------------- Reason for Visit: Postop follow-up  Treatment History: Oncology History  Vulvar cancer [facility])   Initial Diagnosis   Was being followed for lichen sclerosis   12/26/2022 Initial Biopsy   A. VULVA, BIOPSY:               High-grade squamous intraepithelial lesion (VIN-3) Comment    Immunohistochemistry was performed on block A1. The p16 shows block-type positivity. The p53 stain is wild type pattern.      02/27/2023 Surgery   Simple partial vulvectomy   02/27/2023 Initial Diagnosis   Vulvar cancer (HCC)   02/27/2023 Pathology Results   A. VULVA, POSTERIOR, PARTIAL VULVECTOMY:      Superficially invasive squamous cell carcinoma arising in high-grade squamous intraepithelial lesion (VIN-3).      Tumor size: 1 mm (microscopically).      Depth of invasion: 1 mm.      Surgical margins of resection are negative for dysplasia or invasive carcinoma.      See oncology table.  ONCOLOGY TABLE:  VULVA, CARCINOMA:  Resection  Procedure: Partial vulvectomy Tumor Focality: Unifocal Tumor Site: Posterior vulva Tumor Size: 1 mm (microscopically) Histologic Type: Invasive squamous cell carcinoma Histologic Grade: G1, well-differentiated Depth of Invasion:      Specify depth of invasion (mm): 1.0 mm Other Tissue/ Organ Involvement: Not applicable Lymphovascular Invasion: Not identified Margins:      Margins Involved by Invasive Carcinoma: All margins negative for invasive carcinoma      Margin Status for HSIL or dVIN: Negative Regional Lymph Nodes: Not applicable (no lymph nodes submitted or found)           Lymph Nodes Examined:                                   [0] Sentinel                                   [0] Non-sentinel                                   [0] Total           Number of Nodes with Metastasis 5 mm or Greater: NA           Number of Nodes with Metastasis Less than 5 mm (excludes isolated tumor cells): NA           Number of Nodes with Isolated  Tumor Cells (0.2 mm or less): NA           Additional Lymph Node Findings: None identified Distant Metastasis:      Distant Site(s) Involved: Not applicable Pathologic Stage Classification (pTNM, AJCC 8th Edition): pT1a, pNx Ancillary Studies: Can be performed upon request Representative Tumor Block: A1 Comment(s): Immunohistochemical stain for p16 will be performed and reported in an addendum    02/27/2023 Cancer Staging   Staging form: Vulva, AJCC V9 - Clinical stage from 02/27/2023: FIGO Stage IA (cT1a, cNX, cM0) - Signed by [name], MD on 03/19/2023 Histopathologic type: Squamous cell carcinoma, NOS Stage prefix: Initial diagnosis Histologic grade (G): G1 Histologic grading system: 3 grade system     Interval History: Patient reports that she has been having itching for 2 weeks that is a little higher and away from the surgical area.  This comes and goes.  Still doing sits baths occasionally for comfort.  Otherwise denies  vaginal bleeding or new lesions.   Past Medical/Surgical History: Past Medical History:  Diagnosis Date   AAA (abdominal aortic aneurysm) (HCC)    Allergic rhinitis    Anxiety    Arthritis    back   Chronic hoarseness    Colon polyps    COPD (chronic obstructive pulmonary disease) (HCC)    Dr. Marchelle Gearing   Coronary artery disease    nonobstructive   Dyslipidemia    Gastric bleed 10/2021   stomach ulcers   Gastritis    GERD (gastroesophageal reflux disease)    History of hiatal hernia    Hyperlipidemia    Hypertension    Hypothyroidism    MS (multiple sclerosis) (HCC)    Neuromuscular disorder (HCC)    MS   Obesity    RBBB    Stomach ulcer from aspirin/ibuprofen-like drugs (NSAID's)     Past Surgical History:  Procedure Laterality Date   ABDOMINAL AORTIC ENDOVASCULAR STENT GRAFT N/A 10/31/2022   Procedure: ABDOMINAL AORTIC ENDOVASCULAR STENT GRAFT;  Surgeon: Maeola Harman, MD;  Location: [facility] OR;  Service: Vascular;  Laterality: N/A;   ABDOMINAL HYSTERECTOMY     Endometriosis   BARIATRIC SURGERY  09/07/2008   lap band   CARPAL TUNNEL RELEASE Bilateral    CATARACT EXTRACTION Right 11/08/2017   CATARACT EXTRACTION Left 11/22/2017   CATARACT EXTRACTION, BILATERAL      Toric lenses for astigmatism   CHOLECYSTECTOMY     ESOPHAGOGASTRODUODENOSCOPY (EGD) WITH PROPOFOL N/A 10/17/2021   Procedure: ESOPHAGOGASTRODUODENOSCOPY (EGD) WITH PROPOFOL;  Surgeon: Willis Modena, MD;  Location: [facility] ENDOSCOPY;  Service: Gastroenterology;  Laterality: N/A;   HIATAL HERNIA REPAIR  09/07/2008   LEFT HEART CATH AND CORONARY ANGIOGRAPHY N/A 07/09/2019   Procedure: LEFT HEART CATH AND CORONARY ANGIOGRAPHY;  Surgeon: Lyn Records, MD;  Location: [facility] INVASIVE CV LAB;  Service: Cardiovascular;  Laterality: N/A;   TONSILLECTOMY     ULTRASOUND GUIDANCE FOR VASCULAR ACCESS Bilateral 10/31/2022   Procedure: ULTRASOUND GUIDANCE FOR VASCULAR ACCESS;  Surgeon: Maeola Harman,  MD;  Location: [facility] OR;  Service: Vascular;  Laterality: Bilateral;   VULVECTOMY PARTIAL N/A 02/27/2023   Procedure: VULVECTOMY PARTIAL;  Surgeon: [name], MD;  Location: [facility] ORS;  Service: Gynecology;  Laterality: N/A;    Family History  Problem Relation Age of Onset   Stroke Mother 50   Heart disease Mother        congenital heart defect?   COPD Father    Heart disease Father    AAA (abdominal aortic aneurysm)  Father    COPD Sister    Arthritis Sister    AAA (abdominal aortic aneurysm) Sister    Dementia Sister    Other Sister        brain injury   Multiple sclerosis Neg Hx    Breast cancer Neg Hx    Ovarian cancer Neg Hx    Endometrial cancer Neg Hx    Colon cancer Neg Hx     Social History   Socioeconomic History   Marital status: Married    Spouse name: Casimiro Needle   Number of children: 2   Years of education: 16   Highest education level: Not on file  Occupational History   Occupation: retired  Tobacco Use   Smoking status: Former    Current packs/day: 0.00    Average packs/day: 2.0 packs/day for 40.0 years (80.0 ttl pk-yrs)    Types: Cigarettes    Start date: 04/05/1967    Quit date: 04/05/2007    Years since quitting: 16.1    Passive exposure: Current (Husband)   Smokeless tobacco: Never  Vaping Use   Vaping status: Not on file  Substance and Sexual Activity   Alcohol use: No   Drug use: No   Sexual activity: Not Currently    Partners: Male  Other Topics Concern   Not on file  Social History Narrative   Patient is right handed,reside in home with husband   Social Drivers of Corporate investment banker Strain: Not on file  Food Insecurity: No Food Insecurity (11/02/2022)   Hunger Vital Sign    Worried About Running Out of Food in the Last Year: Never true    Ran Out of Food in the Last Year: Never true  Transportation Needs: No Transportation Needs (11/02/2022)   PRAPARE - Administrator, Civil Service (Medical): No    Lack of  Transportation (Non-Medical): No  Physical Activity: Inactive (04/19/2018)   Exercise Vital Sign    Days of Exercise per Week: 0 days    Minutes of Exercise per Session: 0 min  Stress: No Stress Concern Present (04/19/2018)   [employer] of Occupational Health - Occupational Stress Questionnaire    Feeling of Stress : Not at all  Social Connections: Not on file    Current Medications:  Current Outpatient Medications:    acetaminophen (TYLENOL) 500 MG tablet, Take 500-1,000 mg by mouth every 6 (six) hours as needed for moderate pain or headache., Disp: , Rfl:    atorvastatin (LIPITOR) 80 MG tablet, Take 1 tablet (80 mg total) by mouth daily., Disp: 90 tablet, Rfl: 3   FLUoxetine (PROZAC) 40 MG capsule, Take 1 capsule (40 mg total) by mouth daily., Disp: 90 capsule, Rfl: 3   gabapentin (NEURONTIN) 300 MG capsule, Take 600 mg by mouth every evening. , Disp: , Rfl:    hydrochlorothiazide (HYDRODIURIL) 25 MG tablet, Take 1 tablet (25 mg total) by mouth daily., Disp: 90 tablet, Rfl: 1   HYDROcodone-acetaminophen (NORCO/VICODIN) 5-325 MG tablet, Take 0.5 tablets by mouth every 6 (six) hours as needed for severe pain., Disp: , Rfl:    Interferon Beta-1b (BETASERON/EXTAVIA) 0.3 MG KIT injection, Inject 0.25 mg into the skin every other day., Disp: 42 each, Rfl: 3   levothyroxine (SYNTHROID) 88 MCG tablet, TAKE 1 TABLET BY MOUTH EVERY DAY, Disp: 90 tablet, Rfl: 0   losartan (COZAAR) 100 MG tablet, Take 1 tablet (100 mg total) by mouth daily., Disp: 90 tablet, Rfl: 1   mometasone (NASONEX)  50 MCG/ACT nasal spray, Place 2 sprays into the nose daily as needed (allergies)., Disp: , Rfl:    pantoprazole (PROTONIX) 40 MG tablet, TAKE 1 TABLET (40 MG TOTAL) BY MOUTH TWICE A DAY BEFORE MEALS, Disp: 180 tablet, Rfl: 3   Polyethyl Glycol-Propyl Glycol (SYSTANE ULTRA OP), Place 1 drop into the right eye daily as needed (dry eye). , Disp: , Rfl:    senna (SENOKOT) 8.6 MG tablet, Take 2 tablets by mouth at  bedtime., Disp: , Rfl:    silver sulfADIAZINE (SILVADENE) 1 % cream, Apply 1 application. topically daily as needed (irritated areas under abdomen). (Patient not taking: Reported on 05/08/2023), Disp: , Rfl:   Review of Symptoms: Complete 10-system review is positive for: none  Physical Exam: BP 139/79 (BP Location: Left Arm, Patient Position: Sitting)   Pulse 79   Temp 98.7 F (37.1 C) (Oral)   Resp 19   Wt 196 lb (88.9 kg)   SpO2 99%   BMI 31.64 kg/m  General: Alert, oriented, no acute distress. HEENT: Normocephalic, atraumatic. Neck symmetric without masses. Sclera anicteric.  Chest: Normal work of breathing.   Extremities: Grossly normal range of motion.  Warm, well perfused.  No edema bilaterally. Skin: No rashes or lesions noted. GU: External genitalia with evidence of posterior vulvectomy.  Area now well healed. A small 3mm area of granulation treated with silver nitrate. No other lesions. Exam chaperoned by Kimberly [place], CMA   Laboratory & Radiologic Studies: None

## 2023-05-22 ENCOUNTER — Encounter: Payer: Self-pay | Admitting: Psychiatry

## 2023-05-24 ENCOUNTER — Ambulatory Visit: Payer: PPO | Admitting: Family Medicine

## 2023-05-24 ENCOUNTER — Encounter: Payer: Self-pay | Admitting: Family Medicine

## 2023-05-24 VITALS — BP 120/70 | HR 62 | Temp 97.8°F | Resp 18 | Ht 66.0 in | Wt 197.6 lb

## 2023-05-24 DIAGNOSIS — I10 Essential (primary) hypertension: Secondary | ICD-10-CM | POA: Diagnosis not present

## 2023-05-24 DIAGNOSIS — E785 Hyperlipidemia, unspecified: Secondary | ICD-10-CM | POA: Diagnosis not present

## 2023-05-24 DIAGNOSIS — E559 Vitamin D deficiency, unspecified: Secondary | ICD-10-CM | POA: Diagnosis not present

## 2023-05-24 DIAGNOSIS — E039 Hypothyroidism, unspecified: Secondary | ICD-10-CM

## 2023-05-24 DIAGNOSIS — C519 Malignant neoplasm of vulva, unspecified: Secondary | ICD-10-CM | POA: Diagnosis not present

## 2023-05-24 LAB — COMPREHENSIVE METABOLIC PANEL
ALT: 17 U/L (ref 0–35)
AST: 30 U/L (ref 0–37)
Albumin: 3.7 g/dL (ref 3.5–5.2)
Alkaline Phosphatase: 92 U/L (ref 39–117)
BUN: 12 mg/dL (ref 6–23)
CO2: 28 meq/L (ref 19–32)
Calcium: 9.2 mg/dL (ref 8.4–10.5)
Chloride: 101 meq/L (ref 96–112)
Creatinine, Ser: 1.02 mg/dL (ref 0.40–1.20)
GFR: 54.66 mL/min — ABNORMAL LOW (ref >60.00)
Glucose, Bld: 97 mg/dL (ref 70–99)
Potassium: 4.1 meq/L (ref 3.5–5.1)
Sodium: 135 meq/L (ref 135–145)
Total Bilirubin: 0.5 mg/dL (ref 0.2–1.2)
Total Protein: 6.3 g/dL (ref 6.0–8.3)

## 2023-05-24 LAB — CBC WITH DIFFERENTIAL/PLATELET
Basophils Absolute: 0.1 10*3/uL (ref 0.0–0.1)
Basophils Relative: 0.8 % (ref 0.0–3.0)
Eosinophils Absolute: 0.2 10*3/uL (ref 0.0–0.7)
Eosinophils Relative: 3.1 % (ref 0.0–5.0)
HCT: 39.4 % (ref 36.0–46.0)
Hemoglobin: 13 g/dL (ref 12.0–15.0)
Lymphocytes Relative: 27.5 % (ref 12.0–46.0)
Lymphs Abs: 1.8 10*3/uL (ref 0.7–4.0)
MCHC: 33 g/dL (ref 30.0–36.0)
MCV: 88.8 fL (ref 78.0–100.0)
Monocytes Absolute: 0.6 10*3/uL (ref 0.1–1.0)
Monocytes Relative: 8.5 % (ref 3.0–12.0)
Neutro Abs: 3.9 10*3/uL (ref 1.4–7.7)
Neutrophils Relative %: 60.1 % (ref 43.0–77.0)
Platelets: 114 10*3/uL — ABNORMAL LOW (ref 150.0–400.0)
RBC: 4.43 Mil/uL (ref 3.87–5.11)
RDW: 13.9 % (ref 11.5–15.5)
WBC: 6.5 10*3/uL (ref 4.0–10.5)

## 2023-05-24 LAB — LIPID PANEL
Cholesterol: 121 mg/dL (ref 0–200)
HDL: 45.2 mg/dL
LDL Cholesterol: 56 mg/dL (ref 0–99)
NonHDL: 76.1
Total CHOL/HDL Ratio: 3
Triglycerides: 101 mg/dL (ref 0.0–149.0)
VLDL: 20.2 mg/dL (ref 0.0–40.0)

## 2023-05-24 LAB — TSH: TSH: 1.1 u[IU]/mL (ref 0.35–5.50)

## 2023-05-24 LAB — VITAMIN D 25 HYDROXY (VIT D DEFICIENCY, FRACTURES): VITD: 31.16 ng/mL (ref 30.00–100.00)

## 2023-05-24 MED ORDER — LOSARTAN POTASSIUM 100 MG PO TABS
100.0000 mg | ORAL_TABLET | Freq: Every day | ORAL | 1 refills | Status: DC
Start: 2023-05-24 — End: 2024-01-28

## 2023-05-24 MED ORDER — GABAPENTIN 300 MG PO CAPS: 600.0000 mg | ORAL_CAPSULE | Freq: Every evening | ORAL | 3 refills | Status: AC

## 2023-05-24 MED ORDER — HYDROCHLOROTHIAZIDE 25 MG PO TABS
25.0000 mg | ORAL_TABLET | Freq: Every day | ORAL | 1 refills | Status: AC
Start: 2023-05-24 — End: ?

## 2023-05-24 MED ORDER — LEVOTHYROXINE SODIUM 88 MCG PO TABS: 88.0000 ug | ORAL_TABLET | Freq: Every day | ORAL | 3 refills | Status: DC

## 2023-05-24 NOTE — Progress Notes (Signed)
Established Patient Office Visit  Subjective   Patient ID: Cassandra Rios, female    DOB: 1951/05/24  Age: 72 y.o. MRN: [id number]  Chief Complaint  Patient presents with   Hypertension   Hypothyroidism   Follow-up    HPI Discussed the use of AI scribe software for clinical note transcription with the patient, who gave verbal consent to proceed.  History of Present Illness   The patient, with a history of aneurysm and lichen sclerosus, presents with a recent diagnosis of vulvar cancer. She underwent successful surgical intervention by Dr. Alvester Morin, with all margins clear. The patient reports feeling stronger and healthier than she has in over two years. She is currently on gabapentin for nerve pain, which she believes is helping, but is uncertain due to the recent surgical healing process. She requests a continuation of this prescription.  The patient also reports a recent ER visit due to low oxygen levels, which she attributes to cold hands affecting the reading. She was diagnosed with a flare-up of her known COPD during this visit and was prescribed albuterol, which she uses as needed for wheezing. She has not needed to use it in the past few days.  The patient also mentions a potential change in her betaseron treatment for an unspecified condition, as she approaches the age of 62. She is due to discuss this with her specialist, Aundra Millet, in the coming month. She also notes that she may discontinue this injectable treatment.  The patient's overall mood appears to be low, with a comment suggesting she does not expect to be alive in five years. However, she also expresses satisfaction with her current care team and improvement in her overall health.      Patient Active Problem List   Diagnosis Date Noted   Vulvar cancer (HCC) 02/14/2023   Hypokalemia 02/14/2022   Class 1 obesity 02/14/2022   Acute gastroenteritis 02/14/2022   Tremor 01/02/2022   (HFpEF) heart failure with preserved  ejection fraction (HCC) 11/24/2021   Gastrointestinal hemorrhage associated with gastric ulcer 10/25/2021   UGI bleed 10/17/2021   Misadventure of surgical procedure 09/28/2021   COVID-19 02/22/2021   COPD (chronic obstructive pulmonary disease) (HCC)    Lumbar spondylosis 11/05/2020   Lumbar pain 09/14/2020   Urinary incontinence, mixed 07/07/2020   Chronic midline low back pain without sciatica 04/20/2020   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/25/2020   Abdominal aortic atherosclerosis (HCC) 03/09/2020   Chronic back pain 03/09/2020   Cellulitis of breast 02/03/2020   Flank pain 02/03/2020   CAD (coronary artery disease), native coronary artery 07/09/2019   DOE (dyspnea on exertion) 04/21/2019   Preventative health care 04/21/2019   AAA (abdominal aortic aneurysm) (HCC) 04/19/2018   Chronic headaches 04/19/2018   Lichen sclerosus et atrophicus 03/31/2018   Sinus headache 03/18/2018   Closed fracture of right distal fibula 12/29/2017   Ankle fracture 12/29/2017   Osteoarthritis of acromioclavicular joint 11/05/2017   Impingement syndrome of left shoulder region 11/05/2017   Hyperlipidemia LDL goal <70 09/27/2017   Senile purpura (HCC) 03/29/2017   Lichen sclerosus of female genitalia 10/23/2016   Breast pain, left 03/27/2016   Chest pain 03/27/2016   Essential hypertension 09/24/2015   Sinusitis, acute 03/23/2015   Depression 07/06/2014   Thoracic back pain 07/06/2014   UTI (urinary tract infection) 04/28/2014   Cellulitis and abscess of trunk 02/16/2014   Class 2 obesity due to excess calories with body mass index (BMI) of 37.0 to 37.9 in  adult 06/30/2013   Breast mass, right 01/10/2013   Acute sinusitis 03/22/2009   MULTIPLE SCLEROSIS, RELAPSING/REMITTING 01/04/2009   THROMBOCYTOPENIA 09/15/2008   SHOULDER PAIN, LEFT 09/15/2008   BARIATRIC SURGERY STATUS 09/15/2008   Hypothyroidism 07/20/2008   HIATAL HERNIA 07/20/2008   COPD with acute bronchitis (HCC)  07/23/2007   ABNORMAL PULMONARY TEST RESULTS 07/23/2007   Pulmonary function studies abnormal 07/23/2007   Allergic rhinitis 06/26/2007   HOARSENESS, CHRONIC 06/26/2007   WHEEZING 06/26/2007   SNORING 06/26/2007   COUGH 06/26/2007   TOBACCO ABUSE-HISTORY OF 06/26/2007   COLONIC POLYPS, HYPERPLASTIC 06/25/2007   Hyperlipidemia 06/25/2007   Depression with anxiety 06/25/2007   GASTRIC ULCER 06/25/2007   History of colonic polyps 06/25/2007   Anxiety 06/25/2007   Past Medical History:  Diagnosis Date   AAA (abdominal aortic aneurysm) (HCC)    Allergic rhinitis    Anxiety    Arthritis    back   Chronic hoarseness    Colon polyps    COPD (chronic obstructive pulmonary disease) (HCC)    Dr. Marchelle Gearing   Coronary artery disease    nonobstructive   Dyslipidemia    Gastric bleed 10/2021   stomach ulcers   Gastritis    GERD (gastroesophageal reflux disease)    History of hiatal hernia    Hyperlipidemia    Hypertension    Hypothyroidism    MS (multiple sclerosis) (HCC)    Neuromuscular disorder (HCC)    MS   Obesity    RBBB    Stomach ulcer from aspirin/ibuprofen-like drugs (NSAID's)    Past Surgical History:  Procedure Laterality Date   ABDOMINAL AORTIC ENDOVASCULAR STENT GRAFT N/A 10/31/2022   Procedure: ABDOMINAL AORTIC ENDOVASCULAR STENT GRAFT;  Surgeon: Maeola Harman, MD;  Location[facility];  Service: Vascular;  Laterality: N/A;   ABDOMINAL HYSTERECTOMY     Endometriosis   BARIATRIC SURGERY  09/07/2008   lap band   CARPAL TUNNEL RELEASE Bilateral    CATARACT EXTRACTION Right 11/08/2017   CATARACT EXTRACTION Left 11/22/2017   CATARACT EXTRACTION, BILATERAL      Toric lenses for astigmatism   CHOLECYSTECTOMY     ESOPHAGOGASTRODUODENOSCOPY (EGD) WITH PROPOFOL N/A 10/17/2021   Procedure: ESOPHAGOGASTRODUODENOSCOPY (EGD) WITH PROPOFOL;  Surgeon: Willis Modena, MD;  Location: [facility] ENDOSCOPY;  Service: Gastroenterology;  Laterality: N/A;   HIATAL HERNIA REPAIR   09/07/2008   LEFT HEART CATH AND CORONARY ANGIOGRAPHY N/A 07/09/2019   Procedure: LEFT HEART CATH AND CORONARY ANGIOGRAPHY;  Surgeon: Lyn Records, MD;  Location: [facility] INVASIVE CV LAB;  Service: Cardiovascular;  Laterality: N/A;   TONSILLECTOMY     ULTRASOUND GUIDANCE FOR VASCULAR ACCESS Bilateral 10/31/2022   Procedure: ULTRASOUND GUIDANCE FOR VASCULAR ACCESS;  Surgeon: Maeola Harman, MD;  Location: [facility] OR;  Service: Vascular;  Laterality: Bilateral;   VULVECTOMY PARTIAL N/A 02/27/2023   Procedure: VULVECTOMY PARTIAL;  Surgeon: [name], MD;  Location: [facility] ORS;  Service: Gynecology;  Laterality: N/A;   Social History   Tobacco Use   Smoking status: Former    Current packs/day: 0.00    Average packs/day: 2.0 packs/day for 40.0 years (80.0 ttl pk-yrs)    Types: Cigarettes    Start date: 04/05/1967    Quit date: 04/05/2007    Years since quitting: 16.1    Passive exposure: Current (Husband)   Smokeless tobacco: Never  Substance Use Topics   Alcohol use: No   Drug use: No   Social History   Socioeconomic History   Marital status: Married  Spouse name: Casimiro Needle   Number of children: 2   Years of education: 16   Highest education level: Not on file  Occupational History   Occupation: retired  Tobacco Use   Smoking status: Former    Current packs/day: 0.00    Average packs/day: 2.0 packs/day for 40.0 years (80.0 ttl pk-yrs)    Types: Cigarettes    Start date: 04/05/1967    Quit date: 04/05/2007    Years since quitting: 16.1    Passive exposure: Current (Husband)   Smokeless tobacco: Never  Vaping Use   Vaping status: Not on file  Substance and Sexual Activity   Alcohol use: No   Drug use: No   Sexual activity: Not Currently    Partners: Male  Other Topics Concern   Not on file  Social History Narrative   Patient is right handed,reside in home with husband   Social Drivers of Corporate investment banker Strain: Not on file  Food Insecurity: No  Food Insecurity (11/02/2022)   Hunger Vital Sign    Worried About Running Out of Food in the Last Year: Never true    Ran Out of Food in the Last Year: Never true  Transportation Needs: No Transportation Needs (11/02/2022)   PRAPARE - Administrator, Civil Service (Medical): No    Lack of Transportation (Non-Medical): No  Physical Activity: Inactive (04/19/2018)   Exercise Vital Sign    Days of Exercise per Week: 0 days    Minutes of Exercise per Session: 0 min  Stress: No Stress Concern Present (04/19/2018)   [employer] of Occupational Health - Occupational Stress Questionnaire    Feeling of Stress : Not at all  Social Connections: Not on file  Intimate Partner Violence: Not At Risk (02/14/2022)   Humiliation, Afraid, Rape, and Kick questionnaire    Fear of Current or Ex-Partner: No    Emotionally Abused: No    Physically Abused: No    Sexually Abused: No   Family Status  Relation Name Status   Mother  Deceased at age 53   Father  Deceased at age 16       pneumonia   Sister  Deceased       2017-12-19   Neg Hx  (Not Specified)  No partnership data on file   Family History  Problem Relation Age of Onset   Stroke Mother 26   Heart disease Mother        congenital heart defect?   COPD Father    Heart disease Father    AAA (abdominal aortic aneurysm) Father    COPD Sister    Arthritis Sister    AAA (abdominal aortic aneurysm) Sister    Dementia Sister    Other Sister        brain injury   Multiple sclerosis Neg Hx    Breast cancer Neg Hx    Ovarian cancer Neg Hx    Endometrial cancer Neg Hx    Colon cancer Neg Hx    Allergies  Allergen Reactions   Codeine Itching and Nausea And Vomiting   Hydromorphone Hcl Nausea And Vomiting   Ibuprofen Other (See Comments)    stomach ulcers   Morphine Sulfate     "makes my blood pressure bottom out"   Metronidazole Hives, Nausea And Vomiting and Rash      Review of Systems  Constitutional:  Negative for  chills, fever and malaise/fatigue.  HENT:  Negative for congestion and hearing loss.  Eyes:  Negative for blurred vision and discharge.  Respiratory:  Negative for cough, sputum production and shortness of breath.   Cardiovascular:  Negative for chest pain, palpitations and leg swelling.  Gastrointestinal:  Negative for abdominal pain, blood in stool, constipation, diarrhea, heartburn, nausea and vomiting.  Genitourinary:  Negative for dysuria, frequency, hematuria and urgency.  Musculoskeletal:  Negative for back pain, falls and myalgias.  Skin:  Negative for rash.  Neurological:  Negative for dizziness, sensory change, loss of consciousness, weakness and headaches.  Endo/Heme/Allergies:  Negative for environmental allergies. Does not bruise/bleed easily.  Psychiatric/Behavioral:  Negative for depression and suicidal ideas. The patient is not nervous/anxious and does not have insomnia.       Objective:     BP 120/70 (BP Location: Left Arm, Patient Position: Sitting)   Pulse 62   Temp 97.8 F (36.6 C) (Oral)   Resp 18   Ht 5\' 6"  (1.676 m)   Wt 197 lb 9.6 oz (89.6 kg)   SpO2 98%   BMI 31.89 kg/m  BP Readings from Last 3 Encounters:  05/24/23 120/70  05/14/23 139/79  04/16/23 138/88   Wt Readings from Last 3 Encounters:  05/24/23 197 lb 9.6 oz (89.6 kg)  05/14/23 196 lb (88.9 kg)  04/16/23 197 lb (89.4 kg)   SpO2 Readings from Last 3 Encounters:  05/24/23 98%  05/14/23 99%  04/16/23 100%      Physical Exam Vitals and nursing note reviewed.  Constitutional:      General: She is not in acute distress.    Appearance: Normal appearance. She is well-developed.  HENT:     Head: Normocephalic and atraumatic.  Eyes:     General: No scleral icterus.       Right eye: No discharge.        Left eye: No discharge.  Cardiovascular:     Rate and Rhythm: Normal rate and regular rhythm.     Heart sounds: No murmur heard. Pulmonary:     Effort: Pulmonary effort is normal. No  respiratory distress.     Breath sounds: Normal breath sounds.  Musculoskeletal:        General: Normal range of motion.     Cervical back: Normal range of motion and neck supple.     Right lower leg: No edema.     Left lower leg: No edema.  Skin:    General: Skin is warm and dry.  Neurological:     Mental Status: She is alert and oriented to person, place, and time.  Psychiatric:        Mood and Affect: Mood normal.        Behavior: Behavior normal.        Thought Content: Thought content normal.        Judgment: Judgment normal.      No results found for any visits on 05/24/23.  [id number] CBC Lab Results  Component Value Date   WBC 5.8 02/19/2023   HGB 12.3 02/19/2023   HCT 38.7 02/19/2023   MCV 91.7 02/19/2023   MCH 29.1 02/19/2023   RDW 12.8 02/19/2023   PLT 149 (L) 02/19/2023   Last metabolic panel Lab Results  Component Value Date   GLUCOSE 109 (H) 02/19/2023   NA 132 (L) 02/19/2023   K 4.6 02/19/2023   CL 99 02/19/2023   CO2 24 02/19/2023   BUN 12 02/19/2023   CREATININE 1.04 (H) 02/19/2023   GFRNONAA 57 (L) 02/19/2023   CALCIUM 9.3 02/19/2023  PHOS 4.2 12/30/2017   PROT 6.7 11/21/2022   ALBUMIN 3.7 11/21/2022   LABGLOB 2.7 09/05/2016   AGRATIO 1.5 09/05/2016   BILITOT 0.7 11/21/2022   ALKPHOS 88 11/21/2022   AST 36 11/21/2022   ALT 14 11/21/2022   ANIONGAP 9 02/19/2023   Last lipids Lab Results  Component Value Date   CHOL 119 11/21/2022   HDL 45.80 11/21/2022   LDLCALC 54 11/21/2022   TRIG 94.0 11/21/2022   CHOLHDL 3 11/21/2022   Last hemoglobin A1c Lab Results  Component Value Date   HGBA1C 5.9 09/25/2016   Last thyroid functions Lab Results  Component Value Date   TSH 2.07 11/21/2022   T4TOTAL 10.7 12/23/2018   Last vitamin D Lab Results  Component Value Date   VD25OH 44.36 11/21/2022   Last vitamin B12 and Folate Lab Results  Component Value Date   VITAMINB12 256 05/19/2022      The ASCVD Risk score (Arnett DK, et  al., 2019) failed to calculate for the following reasons:   The valid total cholesterol range is 130 to 320 mg/dL    Assessment & Plan:   Problem List Items Addressed This Visit       Unprioritized   Hypothyroidism   Relevant Medications   levothyroxine (SYNTHROID) 88 MCG tablet   Other Relevant Orders   CBC with Differential/Platelet   TSH   Hyperlipidemia   Relevant Medications   losartan (COZAAR) 100 MG tablet   hydrochlorothiazide (HYDRODIURIL) 25 MG tablet   Other Relevant Orders   Comprehensive metabolic panel   Lipid panel   Vulvar cancer (HCC) - Primary   Relevant Medications   gabapentin (NEURONTIN) 300 MG capsule   Other Visit Diagnoses       Primary hypertension       Relevant Medications   losartan (COZAAR) 100 MG tablet   hydrochlorothiazide (HYDRODIURIL) 25 MG tablet   Other Relevant Orders   CBC with Differential/Platelet   Comprehensive metabolic panel   TSH     Vitamin D deficiency       Relevant Orders   VITAMIN D 25 Hydroxy (Vit-D Deficiency, Fractures)     Assessment and Plan    Vulvar Cancer   Dr. [name] successfully treated her for vulvar cancer secondary to lichen sclerosus, achieving clear margins post-surgery. She will continue follow-up visits with Dr. Alvester Morin every six months for the next two years, transitioning to annual visits thereafter. She prefers to remain under Dr. McKee Blas care for a minimum of three years, extending possibly to five.  Nerve Pain   Post-surgical nerve pain is effectively managed with Gabapentin. She prefers to continue this medication and has requested a 90-day prescription.  Multiple Sclerosis   She is currently on Betaseron for multiple sclerosis and is open to discontinuing the injectable medication as she approaches age 69, pending medical advice. A follow-up with Megan next month will address the continuation of Betaseron.  COPD   She uses albuterol as needed for wheezing and has experienced no  recent exacerbations. With albuterol use less than twice a week, she prefers to maintain the current regimen, with consideration for a steroid inhaler if albuterol usage increases.  General Health Maintenance   Routine lab work is ordered to monitor thyroid, vitamin D, cholesterol, glucose, liver, kidney, and hemoglobin levels for overall health maintenance.  Follow-up   She is scheduled for follow-up with [facility] in January and with Dr. Alvester Morin in June.        No follow-ups  on file.    [name], DO

## 2023-05-25 DIAGNOSIS — H35362 Drusen (degenerative) of macula, left eye: Secondary | ICD-10-CM | POA: Diagnosis not present

## 2023-05-25 DIAGNOSIS — H43813 Vitreous degeneration, bilateral: Secondary | ICD-10-CM | POA: Diagnosis not present

## 2023-05-25 DIAGNOSIS — H35371 Puckering of macula, right eye: Secondary | ICD-10-CM | POA: Diagnosis not present

## 2023-05-25 DIAGNOSIS — H04123 Dry eye syndrome of bilateral lacrimal glands: Secondary | ICD-10-CM | POA: Diagnosis not present

## 2023-05-25 DIAGNOSIS — H26491 Other secondary cataract, right eye: Secondary | ICD-10-CM | POA: Diagnosis not present

## 2023-05-25 DIAGNOSIS — H3561 Retinal hemorrhage, right eye: Secondary | ICD-10-CM | POA: Diagnosis not present

## 2023-06-04 NOTE — Telephone Encounter (Signed)
PAP form completed and faxed 05-14-2023.  Refaxed 06-04-2023. (Needing pt signature and date).  URGENT. Received fax confimation.

## 2023-06-11 ENCOUNTER — Ambulatory Visit: Attending: Radiation Oncology | Admitting: Radiation Oncology

## 2023-06-12 ENCOUNTER — Other Ambulatory Visit: Payer: Self-pay | Admitting: Family Medicine

## 2023-06-12 DIAGNOSIS — Z1231 Encounter for screening mammogram for malignant neoplasm of breast: Secondary | ICD-10-CM

## 2023-06-25 ENCOUNTER — Ambulatory Visit: Payer: PPO | Admitting: Adult Health

## 2023-06-25 NOTE — Progress Notes (Unsigned)
PATIENT: Cassandra Rios DOB: [birth year]  REASON FOR VISIT: follow up HISTORY FROM: patient Primary neurologist: Dr. Anne Hahn  No chief complaint on file.    HISTORY OF PRESENT ILLNESS: Today 06/25/23:  12/05/22: Cassandra Rios is a 73 y.o. female with a history of multiple sclerosis. Returns today for follow-up. Remains on Betaseron and tolerating it well. Had MRI early this year that was relatively stable. No new symptoms. No new numbness or weakness. No change in gait or balance. Using a rollator. Notices some brain fog- notices it more after her AAA surgery. Reports some trouble remembering things. If she gets interrupted then she can't remember the conversation. Lives at home with her husband and son. Able to complete all ADLs independently. Able to do some household chores.   Had AAA surgery and it went well. Has FU tomorrow. Had a retinal bleed in right eye but it has cleared.     06/12/22: Cassandra Rios is a 73 y.o.female with a history of multiple sclerosis. Returns today for follow-up.  She remains on Betaseron. Has been in the hospital twice over the last year- one was for gastric ulcers and next time she went in for viral gastritis. Overall she feels that she has been relatively stable.  Reports that she is more dizzy currently taking meclizine.  Feels weaker since hospitalization. Reports constipation and uses sennakot. States that she stays well hydrated.  Reports that she may have a secondary cataract in the left- had to cancel her eye appointment d/t being sick. Needs to make another appointment.  Last MRI of the brain was in June 2021.  She recently had blood work in December.  Reports in the last 5 to 6 months she has had more headaches.  She states that they are typically in the left temporal region.  But has been in both temporal regions.  She states that the pain can sometimes be stabbing pain but Tylenol usually relieves it  At the last visit with Dr. Marjory Lies  put her on proprnaolol for tremor but it caused bradycardia and PCP took her off.   She does report that she has a fungus in her mouth and is causing her blisters which makes it hard to swallow and her cough has increased. This started within  the last month. No new medication. Saw PCP couple of weeks ago and reports that PCP did not see anything and therefore no medication was started.   Also, has Adominal aortic aneursym- will be repeating MRI d/t enlargement. Possible intervention this year  UPDATE (11/29/21, [facility]): Since last visit, had GI bleed admission in May 2023. Now having some issues with increased BP, working with PCP. Has had 1 fall since last visit (home).    05/25/21: Cassandra Rios is a 74 year old female with a history of multiple sclerosis.  She returns today for follow-up.  She remains on Betaseron.  Overall she feels that she is doing well.  Denies any new numbness or weakness.  No changes with her gait or balance.  She does have ongoing back pain and uses a walker.  No changes with the bowels or bladder.  No changes with the vision.  Reports that she has received injections for back pain with Dr. Ethelene Hal with a did not give her lasting benefit neck step is ablation of the nerve.  11/27/19: MRI of the brain with and without contrast:  IMPRESSION: This MRI of the brain with and without contrast shows the  following: 1.   Multiple T2/FLAIR hyperintense foci in the hemispheres in a pattern and configuration consistent with chronic demyelinating plaque associated with multiple sclerosis.  Some foci could also be due to chronic microvascular ischemic changes.  None of the lesions appear to be acute and they do not enhance.  Compared to the MRI dated 09/21/2017, there is no definite interval change. 2.   The pituitary gland is thinned within a normal sized sella turcica.  This is usually an incidental finding but could also be due to elevated intracranial pressures. 3.   There are no acute  findings and there is a normal enhancement pattern.  11/22/20: Cassandra Rios is a 73 year old female with a history of Multiple sclerosis. She returns today for follow-up.  Overall the patient feels that she is doing well.  She remains on Betaseron.  Denies any new numbness or weakness.  Patient states that she does use a walker but this is due to back pain.  Denies any changes with the bowels or bladder.  She does have some issues with constipation due to her pain medication.  No changes with her vision.  She continues to have low back pain and will be receiving injections by Dr. Ethelene Hal on Thursday  05/19/20: Cassandra Rios is a 73 year old female with a history of multiple sclerosis.  She returns today for follow-up.  She continues on Betaseron.  Reports that she tolerates the medication well other than some bruising at the injection site.  She denies any new numbness or weakness.  Denies any significant changes with her gait or balance.  States that she may get off balance with certain movements.  Denies any changes with the bowels or bladder.  Returns today for evaluation.  11/17/19: Cassandra Rios is a 74 year old female with a history of multiple sclerosis.  She returns today for follow-up.  She remains on Betaseron.  She denies any changes with her gait or balance.  No changes with her vision.  Denies any changes with the bowels or bladder.  She does report new tingling sensation in the lower extremities bilaterally.  She describes this as a pins and needle sensation.  Reports that the sensation comes and goes.  Seems to be worse when she sits down.  She does take gabapentin at bedtime and finds it helpful.  She denies being diabetic although her recent lab work shows elevated glucose level.  I do not see a recent hemoglobin A1c.  She returns today for an evaluation.  HISTORY 05/14/19:   Cassandra Rios is a 73 year old female with a history of multiple sclerosis.  She returns today for follow-up.  She remains on  Betaseron.  She denies any new symptoms.  No new numbness or tingling.  No changes with her gait or balance.  No change with the bowels or bladder.  No changes with her vision.  She states that she has been having shortness of breath.  Her cardiologist has ordered a CT of the heart.  She denies any other symptoms.  She returns today for an evaluation.  REVIEW OF SYSTEMS: Out of a complete 14 system review of symptoms, the patient complains only of the following symptoms, and all other reviewed systems are negative.  See HPI  ALLERGIES: Allergies  Allergen Reactions   Codeine Itching and Nausea And Vomiting   Hydromorphone Hcl Nausea And Vomiting   Ibuprofen Other (See Comments)    stomach ulcers   Morphine Sulfate     "makes my blood  pressure bottom out"   Metronidazole Hives, Nausea And Vomiting and Rash    HOME MEDICATIONS: Outpatient Medications Prior to Visit  Medication Sig Dispense Refill   acetaminophen (TYLENOL) 500 MG tablet Take 500-1,000 mg by mouth every 6 (six) hours as needed for moderate pain or headache.     atorvastatin (LIPITOR) 80 MG tablet Take 1 tablet (80 mg total) by mouth daily. 90 tablet 3   FLUoxetine (PROZAC) 40 MG capsule Take 1 capsule (40 mg total) by mouth daily. 90 capsule 3   gabapentin (NEURONTIN) 300 MG capsule Take 2 capsules (600 mg total) by mouth every evening. 180 capsule 3   hydrochlorothiazide (HYDRODIURIL) 25 MG tablet Take 1 tablet (25 mg total) by mouth daily. 90 tablet 1   HYDROcodone-acetaminophen (NORCO/VICODIN) 5-325 MG tablet Take 0.5 tablets by mouth every 6 (six) hours as needed for severe pain.     Interferon Beta-1b (BETASERON/EXTAVIA) 0.3 MG KIT injection Inject 0.25 mg into the skin every other day. 42 each 3   levothyroxine (SYNTHROID) 88 MCG tablet Take 1 tablet (88 mcg total) by mouth daily. 90 tablet 3   losartan (COZAAR) 100 MG tablet Take 1 tablet (100 mg total) by mouth daily. 90 tablet 1   mometasone (NASONEX) 50 MCG/ACT  nasal spray Place 2 sprays into the nose daily as needed (allergies).     pantoprazole (PROTONIX) 40 MG tablet TAKE 1 TABLET (40 MG TOTAL) BY MOUTH TWICE A DAY BEFORE MEALS 180 tablet 3   Polyethyl Glycol-Propyl Glycol (SYSTANE ULTRA OP) Place 1 drop into the right eye daily as needed (dry eye).      senna (SENOKOT) 8.6 MG tablet Take 2 tablets by mouth at bedtime.     No facility-administered medications prior to visit.    PAST MEDICAL HISTORY: Past Medical History:  Diagnosis Date   AAA (abdominal aortic aneurysm) (HCC)    Allergic rhinitis    Anxiety    Arthritis    back   Chronic hoarseness    Colon polyps    COPD (chronic obstructive pulmonary disease) (HCC)    Dr. Marchelle Gearing   Coronary artery disease    nonobstructive   Dyslipidemia    Gastric bleed 10/2021   stomach ulcers   Gastritis    GERD (gastroesophageal reflux disease)    History of hiatal hernia    Hyperlipidemia    Hypertension    Hypothyroidism    MS (multiple sclerosis) (HCC)    Neuromuscular disorder (HCC)    MS   Obesity    RBBB    Stomach ulcer from aspirin/ibuprofen-like drugs (NSAID's)     PAST SURGICAL HISTORY: Past Surgical History:  Procedure Laterality Date   ABDOMINAL AORTIC ENDOVASCULAR STENT GRAFT N/A 10/31/2022   Procedure: ABDOMINAL AORTIC ENDOVASCULAR STENT GRAFT;  Surgeon: Maeola Harman, MD;  Location: [facility] OR;  Service: Vascular;  Laterality: N/A;   ABDOMINAL HYSTERECTOMY     Endometriosis   BARIATRIC SURGERY  09/07/2008   lap band   CARPAL TUNNEL RELEASE Bilateral    CATARACT EXTRACTION Right 11/08/2017   CATARACT EXTRACTION Left 11/22/2017   CATARACT EXTRACTION, BILATERAL      Toric lenses for astigmatism   CHOLECYSTECTOMY     ESOPHAGOGASTRODUODENOSCOPY (EGD) WITH PROPOFOL N/A 10/17/2021   Procedure: ESOPHAGOGASTRODUODENOSCOPY (EGD) WITH PROPOFOL;  Surgeon: Willis Modena, MD;  Location: [facility] ENDOSCOPY;  Service: Gastroenterology;  Laterality: N/A;   HIATAL HERNIA  REPAIR  09/07/2008   LEFT HEART CATH AND CORONARY ANGIOGRAPHY N/A 07/09/2019   Procedure:  LEFT HEART CATH AND CORONARY ANGIOGRAPHY;  Surgeon: Lyn Records, MD;  Location: [facility] INVASIVE CV LAB;  Service: Cardiovascular;  Laterality: N/A;   TONSILLECTOMY     ULTRASOUND GUIDANCE FOR VASCULAR ACCESS Bilateral 10/31/2022   Procedure: ULTRASOUND GUIDANCE FOR VASCULAR ACCESS;  Surgeon: Maeola Harman, MD;  Location: [facility] OR;  Service: Vascular;  Laterality: Bilateral;   VULVECTOMY PARTIAL N/A 02/27/2023   Procedure: VULVECTOMY PARTIAL;  Surgeon: [name], MD;  Location: [facility] ORS;  Service: Gynecology;  Laterality: N/A;    FAMILY HISTORY: Family History  Problem Relation Age of Onset   Stroke Mother 50   Heart disease Mother        congenital heart defect?   COPD Father    Heart disease Father    AAA (abdominal aortic aneurysm) Father    COPD Sister    Arthritis Sister    AAA (abdominal aortic aneurysm) Sister    Dementia Sister    Other Sister        brain injury   Multiple sclerosis Neg Hx    Breast cancer Neg Hx    Ovarian cancer Neg Hx    Endometrial cancer Neg Hx    Colon cancer Neg Hx     SOCIAL HISTORY: Social History   Socioeconomic History   Marital status: Married    Spouse name: Casimiro Needle   Number of children: 2   Years of education: 16   Highest education level: Not on file  Occupational History   Occupation: retired  Tobacco Use   Smoking status: Former    Current packs/day: 0.00    Average packs/day: 2.0 packs/day for 40.0 years (80.0 ttl pk-yrs)    Types: Cigarettes    Start date: 04/05/1967    Quit date: 04/05/2007    Years since quitting: 16.2    Passive exposure: Current (Husband)   Smokeless tobacco: Never  Vaping Use   Vaping status: Not on file  Substance and Sexual Activity   Alcohol use: No   Drug use: No   Sexual activity: Not Currently    Partners: Male  Other Topics Concern   Not on file  Social History Narrative   Patient  is right handed,reside in home with husband   Social Drivers of Corporate investment banker Strain: Not on file  Food Insecurity: No Food Insecurity (11/02/2022)   Hunger Vital Sign    Worried About Running Out of Food in the Last Year: Never true    Ran Out of Food in the Last Year: Never true  Transportation Needs: No Transportation Needs (11/02/2022)   PRAPARE - Administrator, Civil Service (Medical): No    Lack of Transportation (Non-Medical): No  Physical Activity: Inactive (04/19/2018)   Exercise Vital Sign    Days of Exercise per Week: 0 days    Minutes of Exercise per Session: 0 min  Stress: No Stress Concern Present (04/19/2018)   [employer] of Occupational Health - Occupational Stress Questionnaire    Feeling of Stress : Not at all  Social Connections: Not on file  Intimate Partner Violence: Not At Risk (02/14/2022)   Humiliation, Afraid, Rape, and Kick questionnaire    Fear of Current or Ex-Partner: No    Emotionally Abused: No    Physically Abused: No    Sexually Abused: No      PHYSICAL EXAM  There were no vitals filed for this visit.  There is no height or weight on file to calculate BMI.  Generalized: Well developed, in no acute distress   Neurological examination  Mentation: Alert oriented to time, place, history taking. Follows all commands speech and language fluent Cranial nerve II-XII: Pupils were equal round reactive to light.  Head turning and shoulder shrug  were normal and symmetric. Motor: The motor testing reveals 5 over 5 strength of all 4 extremities. Good symmetric motor tone is noted throughout.  Sensory: Sensory testing is intact to soft touch on all 4 extremities. No evidence of extinction is noted.  Coordination: Cerebellar testing reveals good finger-nose-finger and heel-to-shin bilaterally.  Mild tremor noted in the upper extremities. Gait and station: Patient uses a rollator when ambulating.   DIAGNOSTIC DATA (LABS,  IMAGING, TESTING) - I reviewed patient records, labs, notes, testing and imaging myself where available.  Lab Results  Component Value Date   WBC 6.5 05/24/2023   HGB 13.0 05/24/2023   HCT 39.4 05/24/2023   MCV 88.8 05/24/2023   PLT 114.0 (L) 05/24/2023      Component Value Date/Time   NA 135 05/24/2023 1051   NA 131 (L) 01/11/2022 1008   K 4.1 05/24/2023 1051   CL 101 05/24/2023 1051   CO2 28 05/24/2023 1051   GLUCOSE 97 05/24/2023 1051   BUN 12 05/24/2023 1051   BUN 12 01/11/2022 1008   CREATININE 1.02 05/24/2023 1051   CREATININE 1.04 05/13/2014 0840   CALCIUM 9.2 05/24/2023 1051   PROT 6.3 05/24/2023 1051   PROT 6.2 08/26/2019 0731   ALBUMIN 3.7 05/24/2023 1051   ALBUMIN 3.9 08/26/2019 0731   AST 30 05/24/2023 1051   ALT 17 05/24/2023 1051   ALKPHOS 92 05/24/2023 1051   BILITOT 0.5 05/24/2023 1051   BILITOT 0.3 08/26/2019 0731   GFRNONAA 57 (L) 02/19/2023 1145   GFRNONAA 57 (L) 05/13/2014 0840   GFRAA 67 07/03/2019 1515   GFRAA 66 05/13/2014 0840   Lab Results  Component Value Date   CHOL 121 05/24/2023   HDL 45.20 05/24/2023   LDLCALC 56 05/24/2023   TRIG 101.0 05/24/2023   CHOLHDL 3 05/24/2023   Lab Results  Component Value Date   HGBA1C 5.9 09/25/2016   Lab Results  Component Value Date   VITAMINB12 256 05/19/2022   Lab Results  Component Value Date   TSH 1.10 05/24/2023      ASSESSMENT AND PLAN 73 y.o. year old female  has a past medical history of AAA (abdominal aortic aneurysm) (HCC), Allergic rhinitis, Anxiety, Arthritis, Chronic hoarseness, Colon polyps, COPD (chronic obstructive pulmonary disease) (HCC), Coronary artery disease, Dyslipidemia, Gastric bleed (10/2021), Gastritis, GERD (gastroesophageal reflux disease), History of hiatal hernia, Hyperlipidemia, Hypertension, Hypothyroidism, MS (multiple sclerosis) (HCC), Neuromuscular disorder (HCC), Obesity, RBBB, and Stomach ulcer from aspirin/ibuprofen-like drugs (NSAID's). here  with:  Multiple sclerosis  Continue Betaseron Reviewed labwork in EPIC from 11/21/22 FU 6 months are sooner if needed  Advised patient if her symptoms worsen or she develops new symptoms she should let us know follow-up in 6-7 months or sooner if needed    Butch Penny, MSN, NP-C 06/25/2023, 4:14 PM [facility] [address], [address] [place], [place] [address] [phone]

## 2023-06-26 ENCOUNTER — Ambulatory Visit: Payer: PPO | Admitting: Adult Health

## 2023-06-26 ENCOUNTER — Encounter: Payer: Self-pay | Admitting: Adult Health

## 2023-06-26 VITALS — BP 135/69 | HR 59 | Ht 66.0 in | Wt 198.0 lb

## 2023-06-26 DIAGNOSIS — G35 Multiple sclerosis: Secondary | ICD-10-CM | POA: Diagnosis not present

## 2023-06-26 NOTE — Patient Instructions (Signed)
Continue Betaseron May consider repeating MRI at the next visit

## 2023-07-16 ENCOUNTER — Ambulatory Visit: Payer: PPO

## 2023-08-10 ENCOUNTER — Ambulatory Visit: Payer: Self-pay | Admitting: Family Medicine

## 2023-08-10 NOTE — Telephone Encounter (Signed)
  Chief Complaint: cough Symptoms: cough, ear pain, sore throat Frequency: ongoing Pertinent Negatives: Patient denies fever, chest pain, difficulty breathing Disposition: [] ED /[] Urgent Care (no appt availability in office) / [] Appointment(In office/virtual)/ [x]  [facility]/ [] Home Care/ [x] Refused Recommended Disposition /[] [place]/ []  Follow-up with PCP Additional Notes:  Cough and runny nose started last week. Began taking Mucinex Monday, cough productive of green sputum, ear pain for one week. Using inhaler during cough, using up to 5 times per day with effect. Office visit advised, no availability with PCP, patient refuses all other providers. Please follow up with Leonie Douglas [phone], can leave voicemail if no answer.   Copied from CRM [id number]. Topic: Clinical - Red Word Triage >> Aug 10, 2023 11:06 AM Drema Balzarine wrote: Red Word that prompted transfer to Nurse Triage: Patient says she has severe cough, sore throat, mucus and ear ache, also has been using inhaler - does not want to come into office. Reason for Disposition  Earache  Answer Assessment - Initial Assessment Questions 1. ONSET: "When did the cough begin?"      Last week 2. SEVERITY: "How bad is the cough today?"      Improving with mucinex 3. SPUTUM: "Describe the color of your sputum" (none, dry cough; clear, white, yellow, green)     Green 4. HEMOPTYSIS: "Are you coughing up any blood?" If so ask: "How much?" (flecks, streaks, tablespoons, etc.)     No 5. DIFFICULTY BREATHING: "Are you having difficulty breathing?" If Yes, ask: "How bad is it?" (e.g., mild, moderate, severe)    - MILD: No SOB at rest, mild SOB with walking, speaks normally in sentences, can lie down, no retractions, pulse < 100.    - MODERATE: SOB at rest, SOB with minimal exertion and prefers to sit, cannot lie down flat, speaks in phrases, mild retractions, audible wheezing, pulse 100-120.    - SEVERE: Very SOB at rest,  speaks in single words, struggling to breathe, sitting hunched forward, retractions, pulse > 120      Denies. During cough will use inhaler.  6. FEVER: "Do you have a fever?" If Yes, ask: "What is your temperature, how was it measured, and when did it start?"     denies 7. CARDIAC HISTORY: "Do you have any history of heart disease?" (e.g., heart attack, congestive heart failure)      No, "has some blockages they are keeping an eye on" 8. LUNG HISTORY: "Do you have any history of lung disease?"  (e.g., pulmonary embolus, asthma, emphysema)     COPD 9. PE RISK FACTORS: "Do you have a history of blood clots?" (or: recent major surgery, recent prolonged travel, bedridden)     no 10. OTHER SYMPTOMS: "Do you have any other symptoms?" (e.g., runny nose, wheezing, chest pain)       Notices blood when blowing nose.   11. TRAVEL: "Have you traveled out of the country in the last month?" (e.g., travel history, exposures)       No  Protocols used: Cough - Acute Productive-A-AH

## 2023-08-10 NOTE — Telephone Encounter (Signed)
Pt scheduled for Tuesday

## 2023-08-10 NOTE — Telephone Encounter (Signed)
 Please advise. Pt refused other providers

## 2023-08-14 ENCOUNTER — Ambulatory Visit: Admitting: Family Medicine

## 2023-09-25 DIAGNOSIS — M47896 Other spondylosis, lumbar region: Secondary | ICD-10-CM | POA: Diagnosis not present

## 2023-09-25 DIAGNOSIS — M545 Low back pain, unspecified: Secondary | ICD-10-CM | POA: Diagnosis not present

## 2023-09-25 DIAGNOSIS — M5136 Other intervertebral disc degeneration, lumbar region with discogenic back pain only: Secondary | ICD-10-CM | POA: Diagnosis not present

## 2023-09-25 DIAGNOSIS — Z5181 Encounter for therapeutic drug level monitoring: Secondary | ICD-10-CM | POA: Diagnosis not present

## 2023-09-25 DIAGNOSIS — Z79899 Other long term (current) drug therapy: Secondary | ICD-10-CM | POA: Diagnosis not present

## 2023-09-25 DIAGNOSIS — Z79891 Long term (current) use of opiate analgesic: Secondary | ICD-10-CM | POA: Diagnosis not present

## 2023-10-18 ENCOUNTER — Other Ambulatory Visit: Payer: Self-pay | Admitting: Nurse Practitioner

## 2023-10-25 DIAGNOSIS — H6991 Unspecified Eustachian tube disorder, right ear: Secondary | ICD-10-CM | POA: Diagnosis not present

## 2023-10-26 ENCOUNTER — Other Ambulatory Visit: Payer: Self-pay | Admitting: Family Medicine

## 2023-10-26 DIAGNOSIS — F419 Anxiety disorder, unspecified: Secondary | ICD-10-CM

## 2023-11-08 ENCOUNTER — Encounter: Payer: Self-pay | Admitting: Psychiatry

## 2023-11-12 ENCOUNTER — Inpatient Hospital Stay: Payer: PPO | Attending: Psychiatry | Admitting: Psychiatry

## 2023-11-12 ENCOUNTER — Encounter: Payer: Self-pay | Admitting: Psychiatry

## 2023-11-12 VITALS — BP 131/50 | HR 52 | Temp 97.8°F | Resp 16 | Ht 66.0 in | Wt 198.0 lb

## 2023-11-12 DIAGNOSIS — B3731 Acute candidiasis of vulva and vagina: Secondary | ICD-10-CM | POA: Insufficient documentation

## 2023-11-12 DIAGNOSIS — Z87891 Personal history of nicotine dependence: Secondary | ICD-10-CM | POA: Insufficient documentation

## 2023-11-12 DIAGNOSIS — C519 Malignant neoplasm of vulva, unspecified: Secondary | ICD-10-CM

## 2023-11-12 DIAGNOSIS — Z8544 Personal history of malignant neoplasm of other female genital organs: Secondary | ICD-10-CM | POA: Diagnosis not present

## 2023-11-12 MED ORDER — CLOTRIMAZOLE 1 % EX CREA: 1.0000 | TOPICAL_CREAM | Freq: Two times a day (BID) | CUTANEOUS | 3 refills | Status: AC

## 2023-11-12 NOTE — Progress Notes (Signed)
 Gynecologic Oncology Return Clinic Visit  Date of Service: 11/12/2023 Referring Provider:  Cherry Corolla, MD [facility] - Dermatology [place]  Assessment & Plan: Cassandra Rios is a 73 y.o. 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  Vulvar cancer: -Given microscopic nature and superficial invasion, no adjuvant treatment indicated.  Negative margins for malignancy and dysplasia/dVIN. - NED on exam today. - Signs/symptoms of recurrence reviewed. - Reviewed signs/symptoms of recurrence.  - Surveillance reviewed. Follow-up q6 months x2 years then annually.  - Reviewed that after 5 years NED, will be safe to return to Ob/Gyn.  Vulvar candidiasis: - Area of itching is quite lateral and skin near junction of vulva and thigh.  Some erythema here.  Likely candidiasis on the skin under her pannus, so suspect that this may be itching from a yeast infection. - Prescription sent for clotrimazole cream. - Will trial antifungal cream and see back in 6 to 8 weeks for repeat exam. - If no improvement in symptoms with clotrimazole cream, will consider biopsy at next visit.   RTC 6-8 weeks  Derrel Flies, MD Gynecologic Oncology  ----------------------- Reason for Visit: Surveillance  Treatment History: Oncology History  Vulvar cancer [facility])   Initial Diagnosis   Was being followed for lichen sclerosis   12/26/2022 Initial Biopsy   A. VULVA, BIOPSY:               High-grade squamous intraepithelial lesion (VIN-3) Comment    Immunohistochemistry was performed on block A1. The p16 shows block-type positivity. The p53 stain is wild type pattern.      02/27/2023 Surgery   Simple partial vulvectomy   02/27/2023 Initial Diagnosis   Vulvar cancer (HCC)   02/27/2023 Pathology Results   A. VULVA, POSTERIOR, PARTIAL VULVECTOMY:      Superficially  invasive squamous cell carcinoma arising in high-grade squamous intraepithelial lesion (VIN-3).      Tumor size: 1 mm (microscopically).      Depth of invasion: 1 mm.      Surgical margins of resection are negative for dysplasia or invasive carcinoma.      See oncology table.  ONCOLOGY TABLE:  VULVA, CARCINOMA: Resection  Procedure: Partial vulvectomy Tumor Focality: Unifocal Tumor Site: Posterior vulva Tumor Size: 1 mm (microscopically) Histologic Type: Invasive squamous cell carcinoma Histologic Grade: G1, well-differentiated Depth of Invasion:      Specify depth of invasion (mm): 1.0 mm Other Tissue/ Organ Involvement: Not applicable Lymphovascular Invasion: Not identified Margins:      Margins Involved by Invasive Carcinoma: All margins negative for invasive carcinoma      Margin Status for HSIL or dVIN: Negative Regional Lymph Nodes: Not applicable (no lymph nodes submitted or found)           Lymph Nodes Examined:                                   [0] Sentinel                                   [0] Non-sentinel                                   [  0] Total           Number of Nodes with Metastasis 5 mm or Greater: NA           Number of Nodes with Metastasis Less than 5 mm (excludes isolated tumor cells): NA           Number of Nodes with Isolated Tumor Cells (0.2 mm or less): NA           Additional Lymph Node Findings: None identified Distant Metastasis:      Distant Site(s) Involved: Not applicable Pathologic Stage Classification (pTNM, AJCC 8th Edition): pT1a, pNx Ancillary Studies: Can be performed upon request Representative Tumor Block: A1 Comment(s): Immunohistochemical stain for p16 will be performed and reported in an addendum    02/27/2023 Cancer Staging   Staging form: Vulva, AJCC V9 - Clinical stage from 02/27/2023: FIGO Stage IA (cT1a, cNX, cM0) - Signed by Derrel Flies, MD on 03/19/2023 Histopathologic type: Squamous cell carcinoma, NOS Stage  prefix: Initial diagnosis Histologic grade (G): G1 Histologic grading system: 3 grade system     Interval History: Patient reports that her husband of 52 years passed away in 09-01-2023.  She is adjusting to this.  Following with a grief counselor.  Otherwise notes some new itching on the outer part of her vulva on the right more than the left.  Denies any obvious lesions and no bleeding.  Occasionally uses leftover Silvadene  cream which helps with the itching.   Past Medical/Surgical History: Past Medical History:  Diagnosis Date   AAA (abdominal aortic aneurysm) (HCC)    Allergic rhinitis    Anxiety    Arthritis    back   Chronic hoarseness    Colon polyps    COPD (chronic obstructive pulmonary disease) (HCC)    Dr. Bertrum Brodie   Coronary artery disease    nonobstructive   Dyslipidemia    Gastric bleed 10/2021   stomach ulcers   Gastritis    GERD (gastroesophageal reflux disease)    History of hiatal hernia    Hyperlipidemia    Hypertension    Hypothyroidism    MS (multiple sclerosis) (HCC)    Neuromuscular disorder (HCC)    MS   Obesity    RBBB    Stomach ulcer from aspirin /ibuprofen-like drugs (NSAID's)     Past Surgical History:  Procedure Laterality Date   ABDOMINAL AORTIC ENDOVASCULAR STENT GRAFT N/A 10/31/2022   Procedure: ABDOMINAL AORTIC ENDOVASCULAR STENT GRAFT;  Surgeon: Adine Hoof, MD;  Location: [facility] OR;  Service: Vascular;  Laterality: N/A;   ABDOMINAL HYSTERECTOMY     Endometriosis   BARIATRIC SURGERY  09/07/2008   lap band   CARPAL TUNNEL RELEASE Bilateral    CATARACT EXTRACTION Right 11/08/2017   CATARACT EXTRACTION Left 11/22/2017   CATARACT EXTRACTION, BILATERAL      Toric lenses for astigmatism   CHOLECYSTECTOMY     ESOPHAGOGASTRODUODENOSCOPY (EGD) WITH PROPOFOL  N/A 10/17/2021   Procedure: ESOPHAGOGASTRODUODENOSCOPY (EGD) WITH PROPOFOL ;  Surgeon: Evangeline Hilts, MD;  Location: [facility] ENDOSCOPY;  Service: Gastroenterology;  Laterality:  N/A;   HIATAL HERNIA REPAIR  09/07/2008   LEFT HEART CATH AND CORONARY ANGIOGRAPHY N/A 07/09/2019   Procedure: LEFT HEART CATH AND CORONARY ANGIOGRAPHY;  Surgeon: Arty Binning, MD;  Location: [facility] INVASIVE CV LAB;  Service: Cardiovascular;  Laterality: N/A;   TONSILLECTOMY     ULTRASOUND GUIDANCE FOR VASCULAR ACCESS Bilateral 10/31/2022   Procedure: ULTRASOUND GUIDANCE FOR VASCULAR ACCESS;  Surgeon: Adine Hoof, MD;  Location: [facility]  OR;  Service: Vascular;  Laterality: Bilateral;   VULVECTOMY PARTIAL N/A 02/27/2023   Procedure: VULVECTOMY PARTIAL;  Surgeon: Derrel Flies, MD;  Location: [facility] ORS;  Service: Gynecology;  Laterality: N/A;    Family History  Problem Relation Age of Onset   Stroke Mother 4   Heart disease Mother        congenital heart defect?   COPD Father    Heart disease Father    AAA (abdominal aortic aneurysm) Father    COPD Sister    Arthritis Sister    AAA (abdominal aortic aneurysm) Sister    Dementia Sister    Other Sister        brain injury   Multiple sclerosis Neg Hx    Breast cancer Neg Hx    Ovarian cancer Neg Hx    Endometrial cancer Neg Hx    Colon cancer Neg Hx     Social History   Socioeconomic History   Marital status: Married    Spouse name: Bambi Lever   Number of children: 2   Years of education: 16   Highest education level: Not on file  Occupational History   Occupation: retired  Tobacco Use   Smoking status: Former    Current packs/day: 0.00    Average packs/day: 2.0 packs/day for 40.0 years (80.0 ttl pk-yrs)    Types: Cigarettes    Start date: 04/05/1967    Quit date: 04/05/2007    Years since quitting: 16.6    Passive exposure: Current (Husband)   Smokeless tobacco: Never  Vaping Use   Vaping status: Not on file  Substance and Sexual Activity   Alcohol  use: No   Drug use: No   Sexual activity: Not Currently    Partners: Male  Other Topics Concern   Not on file  Social History Narrative   Patient is right  handed,reside in home with husband   Social Drivers of Corporate investment banker Strain: Not on file  Food Insecurity: No Food Insecurity (11/02/2022)   Hunger Vital Sign    Worried About Running Out of Food in the Last Year: Never true    Ran Out of Food in the Last Year: Never true  Transportation Needs: No Transportation Needs (11/02/2022)   PRAPARE - Administrator, Civil Service (Medical): No    Lack of Transportation (Non-Medical): No  Physical Activity: Inactive (04/19/2018)   Exercise Vital Sign    Days of Exercise per Week: 0 days    Minutes of Exercise per Session: 0 min  Stress: No Stress Concern Present (04/19/2018)   [employer] of Occupational Health - Occupational Stress Questionnaire    Feeling of Stress : Not at all  Social Connections: Not on file    Current Medications:  Current Outpatient Medications:    acetaminophen  (TYLENOL ) 500 MG tablet, Take 500-1,000 mg by mouth every 6 (six) hours as needed for moderate pain or headache., Disp: , Rfl:    atorvastatin  (LIPITOR ) 80 MG tablet, TAKE 1 TABLET BY MOUTH EVERY DAY, Disp: 90 tablet, Rfl: 1   Cholecalciferol (VITAMIN D3) 50 MCG (2000 UT) capsule, Take 2,000 Units by mouth daily., Disp: , Rfl:    clotrimazole (LOTRIMIN) 1 % cream, Apply 1 Application topically 2 (two) times daily., Disp: 30 g, Rfl: 3   FLUoxetine  (PROZAC ) 40 MG capsule, Take 1 capsule (40 mg total) by mouth daily., Disp: 90 capsule, Rfl: 1   gabapentin  (NEURONTIN ) 300 MG capsule, Take 2 capsules (600 mg total)  by mouth every evening., Disp: 180 capsule, Rfl: 3   hydrochlorothiazide  (HYDRODIURIL ) 25 MG tablet, Take 1 tablet (25 mg total) by mouth daily., Disp: 90 tablet, Rfl: 1   HYDROcodone -acetaminophen  (NORCO/VICODIN) 5-325 MG tablet, Take 0.5 tablets by mouth every 6 (six) hours as needed for severe pain., Disp: , Rfl:    Interferon Beta-1b  (BETASERON /EXTAVIA ) 0.3 MG KIT injection, Inject 0.25 mg into the skin every other day.,  Disp: 42 each, Rfl: 3   levothyroxine  (SYNTHROID ) 88 MCG tablet, Take 1 tablet (88 mcg total) by mouth daily., Disp: 90 tablet, Rfl: 3   losartan  (COZAAR ) 100 MG tablet, Take 1 tablet (100 mg total) by mouth daily., Disp: 90 tablet, Rfl: 1   mometasone  (NASONEX ) 50 MCG/ACT nasal spray, Place 2 sprays into the nose daily as needed (allergies)., Disp: , Rfl:    Polyethyl Glycol-Propyl Glycol (SYSTANE ULTRA OP), Place 1 drop into the right eye daily as needed (dry eye). , Disp: , Rfl:    senna (SENOKOT) 8.6 MG tablet, Take 2 tablets by mouth at bedtime., Disp: , Rfl:   Review of Symptoms: Complete 10-system review is positive for: Shortness of breath, palpitations, urinary frequency, dizziness, itching, problem with walking  Physical Exam: BP (!) 131/50 (BP Location: Left Arm, Patient Position: Sitting) Comment: CMA notified  Pulse (!) 52 Comment: CMA notified  Temp 97.8 F (36.6 C) (Oral)   Resp 16   Ht 5\' 6"  (1.676 m)   Wt 198 lb (89.8 kg)   SpO2 100%   BMI 31.96 kg/m  General: Alert, oriented, no acute distress. HEENT: Normocephalic, atraumatic. Neck symmetric without masses. Sclera anicteric.  Chest: Normal work of breathing. Clear to auscultation bilaterally.   Cardiovascular: Regular rate and rhythm, no murmurs. Abdomen: Soft, nontender.  Normoactive bowel sounds.  Extremities: Grossly normal range of motion.  Warm, well perfused.  No edema bilaterally. Skin: No rashes or lesions noted.  Bluish coloration of toes and feet.  Erythema under pannus consistent with likely irritation and candidiasis. Lymphatics: No cervical, supraclavicular, or inguinal adenopathy. GU: External genitalia without lesions.  Patient points out area of itching which is located along the right outer labia majora at the junction with the thigh.  Mild erythema.  No overt lesions.  Remainder of external genitalia normal in appearance.  Well-healed from prior vulvectomy at posterior fourchette.  Speculum exam  reveals normal vaginal mucosa.  Bimanual exam reveals smooth vagina, no nodularity.  Exam chaperoned by Kimberly [place], CMA   Laboratory & Radiologic Studies: None

## 2023-11-12 NOTE — Patient Instructions (Signed)
 It was a pleasure to see you in clinic today. - Prescription sent for yeast cream. Apply to areas of itching on the vulva. - Return visit planned for 6-8 weeks to see if any improvement since using the cream.  Thank you very much for allowing me to provide care for you today.  I appreciate your confidence in choosing our Gynecologic Oncology team at [facility].  If you have any questions about your visit today please call our office or send us  a MyChart message and we will get back to you as soon as possible.

## 2023-11-19 ENCOUNTER — Other Ambulatory Visit: Payer: Self-pay | Admitting: Family Medicine

## 2023-11-19 DIAGNOSIS — I10 Essential (primary) hypertension: Secondary | ICD-10-CM

## 2023-11-22 ENCOUNTER — Encounter: Payer: Self-pay | Admitting: Family Medicine

## 2023-11-22 ENCOUNTER — Ambulatory Visit (INDEPENDENT_AMBULATORY_CARE_PROVIDER_SITE_OTHER): Payer: PPO | Admitting: Family Medicine

## 2023-11-22 VITALS — BP 110/70 | HR 72 | Temp 98.1°F | Resp 18 | Ht 66.0 in | Wt 197.4 lb

## 2023-11-22 DIAGNOSIS — E785 Hyperlipidemia, unspecified: Secondary | ICD-10-CM

## 2023-11-22 DIAGNOSIS — E039 Hypothyroidism, unspecified: Secondary | ICD-10-CM | POA: Diagnosis not present

## 2023-11-22 DIAGNOSIS — E559 Vitamin D deficiency, unspecified: Secondary | ICD-10-CM

## 2023-11-22 DIAGNOSIS — I10 Essential (primary) hypertension: Secondary | ICD-10-CM

## 2023-11-22 LAB — CBC WITH DIFFERENTIAL/PLATELET
Basophils Absolute: 0 10*3/uL (ref 0.0–0.1)
Basophils Relative: 0.8 % (ref 0.0–3.0)
Eosinophils Absolute: 0.1 10*3/uL (ref 0.0–0.7)
Eosinophils Relative: 2.3 % (ref 0.0–5.0)
HCT: 39.1 % (ref 36.0–46.0)
Hemoglobin: 13 g/dL (ref 12.0–15.0)
Lymphocytes Relative: 18.8 % (ref 12.0–46.0)
Lymphs Abs: 1 10*3/uL (ref 0.7–4.0)
MCHC: 33.2 g/dL (ref 30.0–36.0)
MCV: 87.6 fl (ref 78.0–100.0)
Monocytes Absolute: 0.7 10*3/uL (ref 0.1–1.0)
Monocytes Relative: 12.1 % — ABNORMAL HIGH (ref 3.0–12.0)
Neutro Abs: 3.6 10*3/uL (ref 1.4–7.7)
Neutrophils Relative %: 66 % (ref 43.0–77.0)
Platelets: 136 10*3/uL — ABNORMAL LOW (ref 150.0–400.0)
RBC: 4.46 Mil/uL (ref 3.87–5.11)
RDW: 13.6 % (ref 11.5–15.5)
WBC: 5.4 10*3/uL (ref 4.0–10.5)

## 2023-11-22 LAB — COMPREHENSIVE METABOLIC PANEL WITH GFR
ALT: 21 U/L (ref 0–35)
AST: 40 U/L — ABNORMAL HIGH (ref 0–37)
Albumin: 3.8 g/dL (ref 3.5–5.2)
Alkaline Phosphatase: 97 U/L (ref 39–117)
BUN: 17 mg/dL (ref 6–23)
CO2: 28 meq/L (ref 19–32)
Calcium: 9.3 mg/dL (ref 8.4–10.5)
Chloride: 98 meq/L (ref 96–112)
Creatinine, Ser: 1.12 mg/dL (ref 0.40–1.20)
GFR: 48.68 mL/min — ABNORMAL LOW (ref >60.00)
Glucose, Bld: 84 mg/dL (ref 70–99)
Potassium: 4.3 meq/L (ref 3.5–5.1)
Sodium: 131 meq/L — ABNORMAL LOW (ref 135–145)
Total Bilirubin: 0.6 mg/dL (ref 0.2–1.2)
Total Protein: 6.4 g/dL (ref 6.0–8.3)

## 2023-11-22 LAB — LIPID PANEL
Cholesterol: 122 mg/dL (ref 0–200)
HDL: 51.1 mg/dL (ref >39.00)
LDL Cholesterol: 54 mg/dL (ref 0–99)
NonHDL: 71.21
Total CHOL/HDL Ratio: 2
Triglycerides: 84 mg/dL (ref 0.0–149.0)
VLDL: 16.8 mg/dL (ref 0.0–40.0)

## 2023-11-22 LAB — VITAMIN D 25 HYDROXY (VIT D DEFICIENCY, FRACTURES): VITD: 27.2 ng/mL — ABNORMAL LOW (ref 30.00–100.00)

## 2023-11-22 LAB — TSH: TSH: 3.29 u[IU]/mL (ref 0.35–5.50)

## 2023-11-22 NOTE — Assessment & Plan Note (Signed)
 Encourage heart healthy diet such as MIND or DASH diet, increase exercise, avoid trans fats, simple carbohydrates and processed foods, consider a krill or fish or flaxseed oil cap daily.
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
## 2023-11-22 NOTE — Assessment & Plan Note (Signed)
 Well controlled, no changes to meds. Encouraged heart healthy diet such as the DASH diet and exercise as tolerated.

## 2023-11-22 NOTE — Patient Instructions (Signed)

## 2023-11-26 ENCOUNTER — Ambulatory Visit: Payer: Self-pay | Admitting: Family Medicine

## 2023-11-28 ENCOUNTER — Other Ambulatory Visit: Payer: Self-pay

## 2023-11-28 DIAGNOSIS — I7143 Infrarenal abdominal aortic aneurysm, without rupture: Secondary | ICD-10-CM

## 2023-11-30 ENCOUNTER — Other Ambulatory Visit: Payer: Self-pay

## 2023-11-30 MED ORDER — VITAMIN D (ERGOCALCIFEROL) 1.25 MG (50000 UNIT) PO CAPS: 50000.0000 [IU] | ORAL_CAPSULE | ORAL | 1 refills | Status: DC

## 2023-12-03 ENCOUNTER — Ambulatory Visit: Admitting: Cardiology

## 2023-12-05 ENCOUNTER — Telehealth: Payer: Self-pay

## 2023-12-05 NOTE — Telephone Encounter (Signed)
Forms placed in folder °

## 2023-12-05 NOTE — Telephone Encounter (Signed)
 Copied from CRM [id number]. Topic: General - Other >> Dec 05, 2023  2:23 PM DeAngela L wrote: Reason for CRM: patient needs 2 forms she has a truck and a car for her handicap plac renewal and could you please mail this to the patient and could this be 2 forms and the patient is asking please please mail this to her as soon as possible (she apologizes and patient states this slipped her mind when she was at her appt and she also lives really far)   Pt [phone] ok to leave a detailed message

## 2023-12-12 ENCOUNTER — Other Ambulatory Visit (HOSPITAL_COMMUNITY)

## 2023-12-12 ENCOUNTER — Ambulatory Visit

## 2023-12-13 ENCOUNTER — Ambulatory Visit

## 2023-12-17 NOTE — Telephone Encounter (Signed)
Forms placed in mail.

## 2023-12-31 ENCOUNTER — Encounter: Payer: Self-pay | Admitting: Psychiatry

## 2023-12-31 ENCOUNTER — Other Ambulatory Visit: Payer: Self-pay

## 2023-12-31 ENCOUNTER — Inpatient Hospital Stay: Attending: Psychiatry | Admitting: Psychiatry

## 2023-12-31 VITALS — BP 94/66 | HR 82 | Temp 97.8°F | Resp 20 | Wt 193.0 lb

## 2023-12-31 DIAGNOSIS — C519 Malignant neoplasm of vulva, unspecified: Secondary | ICD-10-CM

## 2023-12-31 DIAGNOSIS — Z9079 Acquired absence of other genital organ(s): Secondary | ICD-10-CM | POA: Insufficient documentation

## 2023-12-31 DIAGNOSIS — L292 Pruritus vulvae: Secondary | ICD-10-CM | POA: Diagnosis not present

## 2023-12-31 DIAGNOSIS — N9089 Other specified noninflammatory disorders of vulva and perineum: Secondary | ICD-10-CM | POA: Diagnosis not present

## 2023-12-31 DIAGNOSIS — Z8544 Personal history of malignant neoplasm of other female genital organs: Secondary | ICD-10-CM | POA: Insufficient documentation

## 2023-12-31 NOTE — Progress Notes (Signed)
 Gynecologic Oncology Return Clinic Visit  Date of Service: 12/31/2023 Referring Provider:  Ricka Ruffini, MD [facility] - Dermatology [place]  Assessment & Plan: Cassandra Rios is a 73 y.o. 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  Vulvar cancer: -Given microscopic nature and superficial invasion, no adjuvant treatment indicated.  Negative margins for malignancy and dysplasia/dVIN. - NED on exam today. See colpo note below. Biopsy pending.  - Signs/symptoms of recurrence reviewed. - Reviewed signs/symptoms of recurrence.  - Surveillance reviewed. Follow-up q6 months x2 years then annually.  - Reviewed that after 5 years NED, will be safe to return to Ob/Gyn.  Vulvar itching: - No obvious lesion. - Colpo with small area of acetowhite change on right labia majora - Biopsy obtained. Will follow-up results. - Biopsy also sent for culture to better determine if this is yeast versus dysplasia. Okay to continue clotrimazole  at this time given some relief.    RTC 63mo unless indicated sooner by biopsy results  [name], MD Gynecologic Oncology  Medical Decision Making I personally spent  TOTAL 25 minutes face-to-face and non-face-to-face in the care of this patient, which includes all pre, intra, and post visit time on the date of service.   ----------------------- Reason for Visit: Follow-up  Treatment History: Oncology History  Vulvar cancer (HCC)   Initial Diagnosis   Was being followed for lichen sclerosis   12/26/2022 Initial Biopsy   A. VULVA, BIOPSY:               High-grade squamous intraepithelial lesion (VIN-3) Comment    Immunohistochemistry was performed on block A1. The p16 shows block-type positivity. The p53 stain is wild type pattern.      02/27/2023 Surgery   Simple partial vulvectomy   02/27/2023 Initial  Diagnosis   Vulvar cancer (HCC)   02/27/2023 Pathology Results   A. VULVA, POSTERIOR, PARTIAL VULVECTOMY:      Superficially invasive squamous cell carcinoma arising in high-grade squamous intraepithelial lesion (VIN-3).      Tumor size: 1 mm (microscopically).      Depth of invasion: 1 mm.      Surgical margins of resection are negative for dysplasia or invasive carcinoma.      See oncology table.  ONCOLOGY TABLE:  VULVA, CARCINOMA: Resection  Procedure: Partial vulvectomy Tumor Focality: Unifocal Tumor Site: Posterior vulva Tumor Size: 1 mm (microscopically) Histologic Type: Invasive squamous cell carcinoma Histologic Grade: G1, well-differentiated Depth of Invasion:      Specify depth of invasion (mm): 1.0 mm Other Tissue/ Organ Involvement: Not applicable Lymphovascular Invasion: Not identified Margins:      Margins Involved by Invasive Carcinoma: All margins negative for invasive carcinoma      Margin Status for HSIL or dVIN: Negative Regional Lymph Nodes: Not applicable (no lymph nodes submitted or found)           Lymph Nodes Examined:                                   [0] Sentinel                                   [0] Non-sentinel                                   [  0] Total           Number of Nodes with Metastasis 5 mm or Greater: NA           Number of Nodes with Metastasis Less than 5 mm (excludes isolated tumor cells): NA           Number of Nodes with Isolated Tumor Cells (0.2 mm or less): NA           Additional Lymph Node Findings: None identified Distant Metastasis:      Distant Site(s) Involved: Not applicable Pathologic Stage Classification (pTNM, AJCC 8th Edition): pT1a, pNx Ancillary Studies: Can be performed upon request Representative Tumor Block: A1 Comment(s): Immunohistochemical stain for p16 will be performed and reported in an addendum    02/27/2023 Cancer Staging   Staging form: Vulva, AJCC V9 - Clinical stage from 02/27/2023: FIGO Stage IA  (cT1a, cNX, cM0) - Signed by Eldonna Mays, MD on 03/19/2023 Histopathologic type: Squamous cell carcinoma, NOS Stage prefix: Initial diagnosis Histologic grade (G): G1 Histologic grading system: 3 grade system     Interval History: Pt has been using the clotrimazole . Reports that it helps with the itching but isn't persistent relief. Has not gone away completely. No new lesions that she is aware of.     Past Medical/Surgical History: Past Medical History:  Diagnosis Date   AAA (abdominal aortic aneurysm) (HCC)    Allergic rhinitis    Anxiety    Arthritis    back   Chronic hoarseness    Colon polyps    COPD (chronic obstructive pulmonary disease) (HCC)    Dr. Geronimo   Coronary artery disease    nonobstructive   Dyslipidemia    Gastric bleed 10/2021   stomach ulcers   Gastritis    GERD (gastroesophageal reflux disease)    History of hiatal hernia    Hyperlipidemia    Hypertension    Hypothyroidism    MS (multiple sclerosis) (HCC)    Neuromuscular disorder (HCC)    MS   Obesity    RBBB    Stomach ulcer from aspirin /ibuprofen-like drugs (NSAID's)     Past Surgical History:  Procedure Laterality Date   ABDOMINAL AORTIC ENDOVASCULAR STENT GRAFT N/A 10/31/2022   Procedure: ABDOMINAL AORTIC ENDOVASCULAR STENT GRAFT;  Surgeon: Sheree Penne Bruckner, MD;  Location: [facility] OR;  Service: Vascular;  Laterality: N/A;   ABDOMINAL HYSTERECTOMY     Endometriosis   BARIATRIC SURGERY  09/07/2008   lap band   CARPAL TUNNEL RELEASE Bilateral    CATARACT EXTRACTION Right 11/08/2017   CATARACT EXTRACTION Left 11/22/2017   CATARACT EXTRACTION, BILATERAL      Toric lenses for astigmatism   CHOLECYSTECTOMY     ESOPHAGOGASTRODUODENOSCOPY (EGD) WITH PROPOFOL  N/A 10/17/2021   Procedure: ESOPHAGOGASTRODUODENOSCOPY (EGD) WITH PROPOFOL ;  Surgeon: Burnette Fallow, MD;  Location: [facility] ENDOSCOPY;  Service: Gastroenterology;  Laterality: N/A;   HIATAL HERNIA REPAIR  09/07/2008   LEFT  HEART CATH AND CORONARY ANGIOGRAPHY N/A 07/09/2019   Procedure: LEFT HEART CATH AND CORONARY ANGIOGRAPHY;  Surgeon: Claudene Victory ORN, MD;  Location: [facility] INVASIVE CV LAB;  Service: Cardiovascular;  Laterality: N/A;   TONSILLECTOMY     ULTRASOUND GUIDANCE FOR VASCULAR ACCESS Bilateral 10/31/2022   Procedure: ULTRASOUND GUIDANCE FOR VASCULAR ACCESS;  Surgeon: Sheree Penne Bruckner, MD;  Location: [facility] OR;  Service: Vascular;  Laterality: Bilateral;   VULVECTOMY PARTIAL N/A 02/27/2023   Procedure: VULVECTOMY PARTIAL;  Surgeon: Eldonna Mays, MD;  Location: [facility] ORS;  Service: Gynecology;  Laterality: N/A;    Family History  Problem Relation Age of Onset   Stroke Mother 75   Heart disease Mother        congenital heart defect?   COPD Father    Heart disease Father    AAA (abdominal aortic aneurysm) Father    COPD Sister    Arthritis Sister    AAA (abdominal aortic aneurysm) Sister    Dementia Sister    Other Sister        brain injury   Multiple sclerosis Neg Hx    Breast cancer Neg Hx    Ovarian cancer Neg Hx    Endometrial cancer Neg Hx    Colon cancer Neg Hx     Social History   Socioeconomic History   Marital status: Married    Spouse name: Ozell   Number of children: 2   Years of education: 16   Highest education level: Not on file  Occupational History   Occupation: retired  Tobacco Use   Smoking status: Former    Current packs/day: 0.00    Average packs/day: 2.0 packs/day for 40.0 years (80.0 ttl pk-yrs)    Types: Cigarettes    Start date: 04/05/1967    Quit date: 04/05/2007    Years since quitting: 16.7    Passive exposure: Current (Husband)   Smokeless tobacco: Never  Vaping Use   Vaping status: Not on file  Substance and Sexual Activity   Alcohol  use: No   Drug use: No   Sexual activity: Not Currently    Partners: Male  Other Topics Concern   Not on file  Social History Narrative   Patient is right handed,reside in home with husband   Social Drivers  of Corporate investment banker Strain: Not on file  Food Insecurity: No Food Insecurity (11/02/2022)   Hunger Vital Sign    Worried About Running Out of Food in the Last Year: Never true    Ran Out of Food in the Last Year: Never true  Transportation Needs: No Transportation Needs (11/02/2022)   PRAPARE - Administrator, Civil Service (Medical): No    Lack of Transportation (Non-Medical): No  Physical Activity: Inactive (04/19/2018)   Exercise Vital Sign    Days of Exercise per Week: 0 days    Minutes of Exercise per Session: 0 min  Stress: No Stress Concern Present (04/19/2018)   [employer] of Occupational Health - Occupational Stress Questionnaire    Feeling of Stress : Not at all  Social Connections: Not on file    Current Medications:  Current Outpatient Medications:    acetaminophen  (TYLENOL ) 500 MG tablet, Take 500-1,000 mg by mouth every 6 (six) hours as needed for moderate pain or headache., Disp: , Rfl:    atorvastatin  (LIPITOR ) 80 MG tablet, TAKE 1 TABLET BY MOUTH EVERY DAY, Disp: 90 tablet, Rfl: 1   Cholecalciferol (VITAMIN D3) 50 MCG (2000 UT) capsule, Take 2,000 Units by mouth daily., Disp: , Rfl:    clotrimazole  (LOTRIMIN ) 1 % cream, Apply 1 Application topically 2 (two) times daily., Disp: 30 g, Rfl: 3   FLUoxetine  (PROZAC ) 40 MG capsule, Take 1 capsule (40 mg total) by mouth daily., Disp: 90 capsule, Rfl: 1   gabapentin  (NEURONTIN ) 300 MG capsule, Take 2 capsules (600 mg total) by mouth every evening., Disp: 180 capsule, Rfl: 3   hydrochlorothiazide  (HYDRODIURIL ) 25 MG tablet, Take 1 tablet (25 mg total) by mouth daily., Disp: 90 tablet, Rfl: 0  HYDROcodone -acetaminophen  (NORCO/VICODIN) 5-325 MG tablet, Take 0.5 tablets by mouth every 6 (six) hours as needed for severe pain., Disp: , Rfl:    Interferon Beta-1b  (BETASERON /EXTAVIA ) 0.3 MG KIT injection, Inject 0.25 mg into the skin every other day., Disp: 42 each, Rfl: 3   levothyroxine  (SYNTHROID ) 88  MCG tablet, Take 1 tablet (88 mcg total) by mouth daily., Disp: 90 tablet, Rfl: 3   losartan  (COZAAR ) 100 MG tablet, Take 1 tablet (100 mg total) by mouth daily., Disp: 90 tablet, Rfl: 1   mometasone  (NASONEX ) 50 MCG/ACT nasal spray, Place 2 sprays into the nose daily as needed (allergies)., Disp: , Rfl:    Polyethyl Glycol-Propyl Glycol (SYSTANE ULTRA OP), Place 1 drop into the right eye daily as needed (dry eye). , Disp: , Rfl:    senna (SENOKOT) 8.6 MG tablet, Take 2 tablets by mouth at bedtime., Disp: , Rfl:    Vitamin D , Ergocalciferol , (DRISDOL ) 1.25 MG (50000 UNIT) CAPS capsule, Take 1 capsule (50,000 Units total) by mouth every 7 (seven) days., Disp: 12 capsule, Rfl: 1  Review of Symptoms: Complete 10-system review is positive for: ringing in ears, vulvar itching, problem walking, back pain  Physical Exam: BP 94/66 (BP Location: Left Arm, Patient Position: Sitting)   Pulse 82   Temp 97.8 F (36.6 C) (Oral)   Resp 20   Wt 193 lb (87.5 kg)   SpO2 100%   BMI 31.15 kg/m  General: Alert, oriented, no acute distress. HEENT: Normocephalic, atraumatic. Neck symmetric without masses. Sclera anicteric.  Chest: Normal work of breathing. Clear to auscultation bilaterally.   Cardiovascular: Regular rate and rhythm, no murmurs. Abdomen: Soft, nontender.  Normoactive bowel sounds.  Extremities: Grossly normal range of motion.  Warm, well perfused.  No edema bilaterally. Skin: No rashes or lesions noted.  Bluish coloration of toes and feet.  Erythema under pannus consistent with likely irritation and candidiasis. Lymphatics: No cervical, supraclavicular, or inguinal adenopathy. GU: External genitalia without lesions.  Patient points out area of itching along the bilateral labia majora.  Mild erythema.  No overt lesions.  Remainder of external genitalia normal in appearance.  Well-healed from prior vulvectomy at posterior fourchette. Exam chaperoned by Kimberly Swaziland, CMA  VULVAR COLPOSCOPY  PROCEDURE NOTE  Procedure Details: After appropriate verbal informed consent was obtained, a timeout was performed. Acetic acid  was applied to the vulva and the vulva was inspected with the colposcope with the findings as noted below. The area to be biopsied was cleansed with betadine. 1ml of 2% lidocaine  was infiltrated. A 3mm punch biopsy was used to obtain two adjacent biopsies (one for path and one for culture). Silver  nitrate was applied and hemoastsis achieved. The vulva was then cleansed of the acetic acid  with water. The patient tolerated the procedure well.   Adequate Exam: yes  Biopsy Specimen: right labia majora (for path and culture)  Condition: Stable. Patient tolerated procedure well.  Complications: None  Findings: Few punctate acetowhite areas on right mid labia majora   Colposcopic Impression: mild dysplasia    Laboratory & Radiologic Studies: None

## 2023-12-31 NOTE — Patient Instructions (Signed)
 It was a pleasure to see you in clinic today. - We will follow-up the biopsy and culture results. - You can continue to use the clotrimazole  to help with itching - Return visit planned for 6 months unless needed sooner based on results from above  Thank you very much for allowing me to provide care for you today.  I appreciate your confidence in choosing our Gynecologic Oncology team at [facility].  If you have any questions about your visit today please call our office or send us  a MyChart message and we will get back to you as soon as possible.

## 2024-01-03 LAB — SURGICAL PATHOLOGY

## 2024-01-08 ENCOUNTER — Telehealth: Payer: Self-pay | Admitting: Psychiatry

## 2024-01-08 NOTE — Telephone Encounter (Signed)
 Reviewed with pt biopsy most consistent with lichen sclerosis. Reports that she has been prescribed triamcinolone in the past for this and has plenty at home. Okay to resume this. All questions answered.

## 2024-01-28 ENCOUNTER — Telehealth: Payer: Self-pay | Admitting: Adult Health

## 2024-01-28 ENCOUNTER — Other Ambulatory Visit: Payer: Self-pay | Admitting: Family Medicine

## 2024-01-28 DIAGNOSIS — I10 Essential (primary) hypertension: Secondary | ICD-10-CM

## 2024-01-28 NOTE — Telephone Encounter (Signed)
 Pt called to inform that she would like MRI of the brain to be done.  Pt stated she has been having really bad  headaches .

## 2024-01-28 NOTE — Telephone Encounter (Signed)
 Can we get her scheduled for in office or virtual visit

## 2024-01-28 NOTE — Telephone Encounter (Signed)
 I called pt and she is asking for a MRI for her MS she says that you discussed last visit.  I do see that (discuss at next visit).  She mentioned that she has had headaches.  She has not been to her pcp for these.  She lost her husband and stressors relating to that.  She has taken tylenol .  She last saw Dr. Margaret and he did address headaches (related to HTN).  I did say that if new problem need to see MD then see NP after.  She mentioned that she is happy to speak to Cooter, NP. If needed.

## 2024-01-29 LAB — FUNGUS CULTURE WITH STAIN

## 2024-01-29 LAB — FUNGAL ORGANISM REFLEX

## 2024-01-29 LAB — FUNGUS CULTURE RESULT

## 2024-01-29 NOTE — Telephone Encounter (Signed)
 Please offer 930 on Wednesday 9/3. Sorry we don't have anything in the afternoon open right now.

## 2024-01-29 NOTE — Telephone Encounter (Signed)
 LMVM for pt to call back about appt offered.

## 2024-01-29 NOTE — Progress Notes (Unsigned)
 HISTORY AND PHYSICAL     CC:  follow up. For EVAR Requesting Provider:  Antonio Cyndee Cassandra Rios, *  HPI: This is a 73 y.o. female who is here today for follow up for AAA and is s/p EVAR on  10/31/2022 by Dr. Sheree.  Pt was last seen 12/06/2022 and at that time, she was doing well.  She had CTA that was without endoleak and maximum diameter of AAA sac 4.9 x 4.1 cm compared to 5.0 x 4.5cm on previous exam.  She was scheduled for one year follow up.    The pt returns today for follow up studies.   The pt is on a statin for cholesterol management.    The pt is not on an aspirin .    Other AC:  none The pt is on ARB, diuretic for hypertension.  The pt is not on medication for diabetes. Tobacco hx:  former  Pt does *** have family hx of AAA.  Past Medical History:  Diagnosis Date   AAA (abdominal aortic aneurysm) (HCC)    Allergic rhinitis    Anxiety    Arthritis    back   Chronic hoarseness    Colon polyps    COPD (chronic obstructive pulmonary disease) (HCC)    Dr. Geronimo   Coronary artery disease    nonobstructive   Dyslipidemia    Gastric bleed 10/2021   stomach ulcers   Gastritis    GERD (gastroesophageal reflux disease)    History of hiatal hernia    Hyperlipidemia    Hypertension    Hypothyroidism    MS (multiple sclerosis) (HCC)    Neuromuscular disorder (HCC)    MS   Obesity    RBBB    Stomach ulcer from aspirin /ibuprofen-like drugs (NSAID's)     Past Surgical History:  Procedure Laterality Date   ABDOMINAL AORTIC ENDOVASCULAR STENT GRAFT N/A 10/31/2022   Procedure: ABDOMINAL AORTIC ENDOVASCULAR STENT GRAFT;  Surgeon: Sheree Penne Bruckner, MD;  Location: [facility] OR;  Service: Vascular;  Laterality: N/A;   ABDOMINAL HYSTERECTOMY     Endometriosis   BARIATRIC SURGERY  09/07/2008   lap band   CARPAL TUNNEL RELEASE Bilateral    CATARACT EXTRACTION Right 11/08/2017   CATARACT EXTRACTION Left 11/22/2017   CATARACT EXTRACTION, BILATERAL      Toric lenses for  astigmatism   CHOLECYSTECTOMY     ESOPHAGOGASTRODUODENOSCOPY (EGD) WITH PROPOFOL  N/A 10/17/2021   Procedure: ESOPHAGOGASTRODUODENOSCOPY (EGD) WITH PROPOFOL ;  Surgeon: Burnette Fallow, MD;  Location: [facility] ENDOSCOPY;  Service: Gastroenterology;  Laterality: N/A;   HIATAL HERNIA REPAIR  09/07/2008   LEFT HEART CATH AND CORONARY ANGIOGRAPHY N/A 07/09/2019   Procedure: LEFT HEART CATH AND CORONARY ANGIOGRAPHY;  Surgeon: Claudene Victory ORN, MD;  Location: [facility] INVASIVE CV LAB;  Service: Cardiovascular;  Laterality: N/A;   TONSILLECTOMY     ULTRASOUND GUIDANCE FOR VASCULAR ACCESS Bilateral 10/31/2022   Procedure: ULTRASOUND GUIDANCE FOR VASCULAR ACCESS;  Surgeon: Sheree Penne Bruckner, MD;  Location: [facility] OR;  Service: Vascular;  Laterality: Bilateral;   VULVECTOMY PARTIAL N/A 02/27/2023   Procedure: VULVECTOMY PARTIAL;  Surgeon: Eldonna Mays, MD;  Location: [facility] ORS;  Service: Gynecology;  Laterality: N/A;    Allergies  Allergen Reactions   Codeine  Itching and Nausea And Vomiting   Hydromorphone Hcl Nausea And Vomiting   Ibuprofen Other (See Comments)    stomach ulcers   Morphine Sulfate     makes my blood pressure bottom out   Metronidazole  Hives, Nausea And Vomiting and Rash  Current Outpatient Medications  Medication Sig Dispense Refill   acetaminophen  (TYLENOL ) 500 MG tablet Take 500-1,000 mg by mouth every 6 (six) hours as needed for moderate pain or headache.     atorvastatin  (LIPITOR ) 80 MG tablet TAKE 1 TABLET BY MOUTH EVERY DAY 90 tablet 1   Cholecalciferol (VITAMIN D3) 50 MCG (2000 UT) capsule Take 2,000 Units by mouth daily.     clotrimazole  (LOTRIMIN ) 1 % cream Apply 1 Application topically 2 (two) times daily. 30 g 3   FLUoxetine  (PROZAC ) 40 MG capsule Take 1 capsule (40 mg total) by mouth daily. 90 capsule 1   gabapentin  (NEURONTIN ) 300 MG capsule Take 2 capsules (600 mg total) by mouth every evening. 180 capsule 3   hydrochlorothiazide  (HYDRODIURIL ) 25 MG tablet Take 1 tablet  (25 mg total) by mouth daily. 90 tablet 0   HYDROcodone -acetaminophen  (NORCO/VICODIN) 5-325 MG tablet Take 0.5 tablets by mouth every 6 (six) hours as needed for severe pain.     Interferon Beta-1b  (BETASERON /EXTAVIA ) 0.3 MG KIT injection Inject 0.25 mg into the skin every other day. 42 each 3   levothyroxine  (SYNTHROID ) 88 MCG tablet Take 1 tablet (88 mcg total) by mouth daily. 90 tablet 3   losartan  (COZAAR ) 100 MG tablet TAKE 1 TABLET BY MOUTH EVERY DAY 90 tablet 1   mometasone  (NASONEX ) 50 MCG/ACT nasal spray Place 2 sprays into the nose daily as needed (allergies).     Polyethyl Glycol-Propyl Glycol (SYSTANE ULTRA OP) Place 1 drop into the right eye daily as needed (dry eye).      senna (SENOKOT) 8.6 MG tablet Take 2 tablets by mouth at bedtime.     Vitamin D , Ergocalciferol , (DRISDOL ) 1.25 MG (50000 UNIT) CAPS capsule Take 1 capsule (50,000 Units total) by mouth every 7 (seven) days. 12 capsule 1   No current facility-administered medications for this visit.    Family History  Problem Relation Age of Onset   Stroke Mother 22   Heart disease Mother        congenital heart defect?   COPD Father    Heart disease Father    AAA (abdominal aortic aneurysm) Father    COPD Sister    Arthritis Sister    AAA (abdominal aortic aneurysm) Sister    Dementia Sister    Other Sister        brain injury   Multiple sclerosis Neg Hx    Breast cancer Neg Hx    Ovarian cancer Neg Hx    Endometrial cancer Neg Hx    Colon cancer Neg Hx     Social History   Socioeconomic History   Marital status: Married    Spouse name: Cassandra Rios   Number of children: 2   Years of education: 16   Highest education level: Not on file  Occupational History   Occupation: retired  Tobacco Use   Smoking status: Former    Current packs/day: 0.00    Average packs/day: 2.0 packs/day for 40.0 years (80.0 ttl pk-yrs)    Types: Cigarettes    Start date: 04/05/1967    Quit date: 04/05/2007    Years since quitting:  16.8    Passive exposure: Current (Husband)   Smokeless tobacco: Never  Vaping Use   Vaping status: Not on file  Substance and Sexual Activity   Alcohol  use: No   Drug use: No   Sexual activity: Not Currently    Partners: Male  Other Topics Concern   Not on file  Social History  Narrative   Patient is right handed,reside in home with husband   Social Drivers of Corporate investment banker Strain: Not on file  Food Insecurity: No Food Insecurity (11/02/2022)   Hunger Vital Sign    Worried About Running Out of Food in the Last Year: Never true    Ran Out of Food in the Last Year: Never true  Transportation Needs: No Transportation Needs (11/02/2022)   PRAPARE - Administrator, Civil Service (Medical): No    Lack of Transportation (Non-Medical): No  Physical Activity: Inactive (04/19/2018)   Exercise Vital Sign    Days of Exercise per Week: 0 days    Minutes of Exercise per Session: 0 min  Stress: No Stress Concern Present (04/19/2018)   [employer] of Occupational Health - Occupational Stress Questionnaire    Feeling of Stress : Not at all  Social Connections: Not on file  Intimate Partner Violence: Not At Risk (02/14/2022)   Humiliation, Afraid, Rape, and Kick questionnaire    Fear of Current or Ex-Partner: No    Emotionally Abused: No    Physically Abused: No    Sexually Abused: No     REVIEW OF SYSTEMS:  *** [X]  denotes positive finding, [ ]  denotes negative finding Cardiac  Comments:  Chest pain or chest pressure:    Shortness of breath upon exertion:    Short of breath when lying flat:    Irregular heart rhythm:        Vascular    Pain in calf, thigh, or hip brought on by ambulation:    Pain in feet at night that wakes you up from your sleep:     Blood clot in your veins:    Leg swelling:         Pulmonary    Oxygen  at home:    Productive cough:     Wheezing:         Neurologic    Sudden weakness in arms or legs:     Sudden numbness in  arms or legs:     Sudden onset of difficulty speaking or slurred speech:    Temporary loss of vision in one eye:     Problems with dizziness:         Gastrointestinal    Blood in stool:     Vomited blood:         Genitourinary    Burning when urinating:     Blood in urine:        Psychiatric    Major depression:         Hematologic    Bleeding problems:    Problems with blood clotting too easily:        Skin    Rashes or ulcers:        Constitutional    Fever or chills:      PHYSICAL EXAMINATION:  ***  General:  WDWN in NAD; vital signs documented above Gait: Not observed HENT: WNL, normocephalic Pulmonary: normal non-labored breathing  Cardiac: {Desc; regular/irreg:14544} HR;  {With/Without:20273} carotid bruit*** Abdomen: soft, NT; aortic pulse is *** palpable Skin: {With/Without:20273} rashes Vascular Exam/Pulses:  Right Left  Radial {Exam; arterial pulse strength 0-4:30167} {Exam; arterial pulse strength 0-4:30167}  Femoral {Exam; arterial pulse strength 0-4:30167} {Exam; arterial pulse strength 0-4:30167}  Popliteal {Exam; arterial pulse strength 0-4:30167} {Exam; arterial pulse strength 0-4:30167}  DP {Exam; arterial pulse strength 0-4:30167} {Exam; arterial pulse strength 0-4:30167}  PT {Exam; arterial pulse strength 0-4:30167} {Exam;  arterial pulse strength 0-4:30167}   Extremities: {With/Without:20273} open wounds Musculoskeletal: no muscle wasting or atrophy  Neurologic: A&O X 3 Psychiatric:  The pt has {Desc; normal/abnormal:11317::Normal} affect.   Non-Invasive Vascular Imaging:   EVAR Arterial duplex on 01/30/2024: ***     ASSESSMENT/PLAN:: 73 y.o. female here with hx of EVAR on  10/31/2022 by Dr. Sheree  -*** -continue statin -pt will f/u in *** with ***.   Lucie Apt, [facility] [phone]  Clinic MD:   Sheree

## 2024-01-29 NOTE — Telephone Encounter (Signed)
 Pt called stating tha she  will not be able to Make it  tomorrow , due to Appt with Vascular Doctor tomorrow

## 2024-01-29 NOTE — Telephone Encounter (Signed)
 Phone rep called pt, she accepted an arrival time of 9:00 for the 9:30 appointment on 02/06/24

## 2024-01-29 NOTE — Telephone Encounter (Signed)
 Ok please offer her this Thursday 8/28 at 830 AM. Can be video visit or in-office.

## 2024-01-29 NOTE — Telephone Encounter (Signed)
 Noted

## 2024-01-30 ENCOUNTER — Ambulatory Visit (HOSPITAL_COMMUNITY)
Admission: RE | Admit: 2024-01-30 | Discharge: 2024-01-30 | Disposition: A | Discharge status: Home / Self Care | Source: Ambulatory Visit | Attending: Vascular Surgery | Admitting: Vascular Surgery

## 2024-01-30 ENCOUNTER — Ambulatory Visit: Attending: Vascular Surgery | Admitting: Physician Assistant

## 2024-01-30 VITALS — BP 150/78 | HR 52 | Temp 97.8°F | Wt 195.8 lb

## 2024-01-30 DIAGNOSIS — I7143 Infrarenal abdominal aortic aneurysm, without rupture: Secondary | ICD-10-CM | POA: Diagnosis not present

## 2024-01-30 DIAGNOSIS — Z95828 Presence of other vascular implants and grafts: Secondary | ICD-10-CM | POA: Diagnosis not present

## 2024-02-06 ENCOUNTER — Ambulatory Visit: Admitting: Adult Health

## 2024-02-06 ENCOUNTER — Encounter: Payer: Self-pay | Admitting: Adult Health

## 2024-02-06 VITALS — BP 121/69 | HR 78 | Ht 66.0 in | Wt 196.0 lb

## 2024-02-06 DIAGNOSIS — Z5181 Encounter for therapeutic drug level monitoring: Secondary | ICD-10-CM | POA: Diagnosis not present

## 2024-02-06 DIAGNOSIS — R296 Repeated falls: Secondary | ICD-10-CM

## 2024-02-06 DIAGNOSIS — G35 Multiple sclerosis: Secondary | ICD-10-CM | POA: Diagnosis not present

## 2024-02-06 NOTE — Patient Instructions (Signed)
 Your Plan:  Continue Betaseron  Blood work today Pending blood work we will order MRI of the brain Use a rollator at all times If your symptoms worsen or you develop new symptoms please let us  know.       Thank you for coming to see us  at [facility]. I hope we have been able to provide you high quality care today.  You may receive a patient satisfaction survey over the next few weeks. We would appreciate your feedback and comments so that we may continue to improve ourselves and the health of our patients.

## 2024-02-06 NOTE — Progress Notes (Signed)
 PATIENT: Cassandra Rios DOB: 1950/09/08  REASON FOR VISIT: follow up HISTORY FROM: patient Primary neurologist: Dr. Margaret  Chief Complaint  Patient presents with   Rm 4    Patient is here alone for MS follow-up. Requesting MRI repeat. Has some dizziness, thinks it might be BP related. Two falls in last 6 months.      HISTORY OF PRESENT ILLNESS: Today 06/26/23:  Cassandra Rios is a 73 y.o. female with a history of Multiple sclerosis. Returns today for follow-up.  She states that over the last 6 months she has had 2 falls.  The first fall she got dizzy and fell onto the arm of her chair.  She did not go to the emergency room at that time.  She states that she had a second fall but this time the dog got under her feet causing her to fall.  She states unfortunately she laid on the patio most of the night until her son Lynwood came home to help her.  She did not visit the hospital then either.  After this fall she had a mild headache but that has slowly gotten better.  Denies any new numbness or weakness.  No changes with the bowels or bladder.  She does use a rollator when ambulating.  States that she does feel tired.  I did note that PCP checked her vitamin D  and it was low.  Patient states that she was not able to access her MyChart account so she was not aware that she should have been taking vitamin D .  Her last MRI of the brain was in January 2024    06/26/23: Cassandra Rios is a 73 y.o. female with a history of multiple Sclerosis. Returns today for follow-up.  Remains on Betaseron .  Overall she feels stable.  She does notice some generalized weakness.  Also reports some weakness in the right arm but plans to see her orthopedist.  No change in bowels or bladder.  Continues to use Senokot for constipation.  No change with her vision.  She does report in the past year she did have a retinal bleed.  No new numbness or tingling.  No recent falls.  Continues to use a rollator when  ambulating.  Returns today for an evaluation.   12/05/22: Cassandra Rios is a 73 y.o. female with a history of multiple sclerosis. Returns today for follow-up. Remains on Betaseron  and tolerating it well. Had MRI early this year that was relatively stable. No new symptoms. No new numbness or weakness. No change in gait or balance. Using a rollator. Notices some brain fog- notices it more after her AAA surgery. Reports some trouble remembering things. If she gets interrupted then she can't remember the conversation. Lives at home with her husband and son. Able to complete all ADLs independently. Able to do some household chores.   Had AAA surgery and it went well. Has FU tomorrow. Had a retinal bleed in right eye but it has cleared.     06/12/22: Cassandra Rios is a 73 y.o.female with a history of multiple sclerosis. Returns today for follow-up.  She remains on Betaseron . Has been in the hospital twice over the last year- one was for gastric ulcers and next time she went in for viral gastritis. Overall she feels that she has been relatively stable.  Reports that she is more dizzy currently taking meclizine .  Feels weaker since hospitalization. Reports constipation and uses sennakot.  States that she stays well hydrated.  Reports that she may have a secondary cataract in the left- had to cancel her eye appointment d/t being sick. Needs to make another appointment.  Last MRI of the brain was in June 2021.  She recently had blood work in December.  Reports in the last 5 to 6 months she has had more headaches.  She states that they are typically in the left temporal region.  But has been in both temporal regions.  She states that the pain can sometimes be stabbing pain but Tylenol  usually relieves it  At the last visit with Dr. Margaret put her on proprnaolol for tremor but it caused bradycardia and PCP took her off.   She does report that she has a fungus in her mouth and is causing her blisters which  makes it hard to swallow and her cough has increased. This started within  the last month. No new medication. Saw PCP couple of weeks ago and reports that PCP did not see anything and therefore no medication was started.   Also, has Adominal aortic aneursym- will be repeating MRI d/t enlargement. Possible intervention this year  UPDATE (11/29/21, [facility]P): Since last visit, had GI bleed admission in May 2023. Now having some issues with increased BP, working with PCP. Has had 1 fall since last visit (home).    05/25/21: Cassandra Rios is a 73 year old female with a history of multiple sclerosis.  She returns today for follow-up.  She remains on Betaseron .  Overall she feels that she is doing well.  Denies any new numbness or weakness.  No changes with her gait or balance.  She does have ongoing back pain and uses a walker.  No changes with the bowels or bladder.  No changes with the vision.  Reports that she has received injections for back pain with Dr. Bonner with a did not give her lasting benefit neck step is ablation of the nerve.  11/27/19: MRI of the brain with and without contrast:  IMPRESSION: This MRI of the brain with and without contrast shows the following: 1.   Multiple T2/FLAIR hyperintense foci in the hemispheres in a pattern and configuration consistent with chronic demyelinating plaque associated with multiple sclerosis.  Some foci could also be due to chronic microvascular ischemic changes.  None of the lesions appear to be acute and they do not enhance.  Compared to the MRI dated 09/21/2017, there is no definite interval change. 2.   The pituitary gland is thinned within a normal sized sella turcica.  This is usually an incidental finding but could also be due to elevated intracranial pressures. 3.   There are no acute findings and there is a normal enhancement pattern.  11/22/20: Cassandra Rios is a 73 year old female with a history of Multiple sclerosis. She returns today for follow-up.   Overall the patient feels that she is doing well.  She remains on Betaseron .  Denies any new numbness or weakness.  Patient states that she does use a walker but this is due to back pain.  Denies any changes with the bowels or bladder.  She does have some issues with constipation due to her pain medication.  No changes with her vision.  She continues to have low back pain and will be receiving injections by Dr. Bonner on Thursday  05/19/20: Cassandra Rios is a 73 year old female with a history of multiple sclerosis.  She returns today for follow-up.  She continues on Betaseron .  Reports that she tolerates the medication well other than some bruising at the injection site.  She denies any new numbness or weakness.  Denies any significant changes with her gait or balance.  States that she may get off balance with certain movements.  Denies any changes with the bowels or bladder.  Returns today for evaluation.  11/17/19: Cassandra Rios is a 73 year old female with a history of multiple sclerosis.  She returns today for follow-up.  She remains on Betaseron .  She denies any changes with her gait or balance.  No changes with her vision.  Denies any changes with the bowels or bladder.  She does report new tingling sensation in the lower extremities bilaterally.  She describes this as a pins and needle sensation.  Reports that the sensation comes and goes.  Seems to be worse when she sits down.  She does take gabapentin  at bedtime and finds it helpful.  She denies being diabetic although her recent lab work shows elevated glucose level.  I do not see a recent hemoglobin A1c.  She returns today for an evaluation.  HISTORY 05/14/19:   Cassandra Rios is a 73 year old female with a history of multiple sclerosis.  She returns today for follow-up.  She remains on Betaseron .  She denies any new symptoms.  No new numbness or tingling.  No changes with her gait or balance.  No change with the bowels or bladder.  No changes with her  vision.  She states that she has been having shortness of breath.  Her cardiologist has ordered a CT of the heart.  She denies any other symptoms.  She returns today for an evaluation.  REVIEW OF SYSTEMS: Out of a complete 14 system review of symptoms, the patient complains only of the following symptoms, and all other reviewed systems are negative.  See HPI  ALLERGIES: Allergies  Allergen Reactions   Codeine  Itching and Nausea And Vomiting   Hydromorphone Hcl Nausea And Vomiting   Ibuprofen Other (See Comments)    stomach ulcers   Morphine Sulfate     makes my blood pressure bottom out   Metronidazole  Hives, Nausea And Vomiting and Rash    HOME MEDICATIONS: Outpatient Medications Prior to Visit  Medication Sig Dispense Refill   acetaminophen  (TYLENOL ) 500 MG tablet Take 500-1,000 mg by mouth every 6 (six) hours as needed for moderate pain or headache.     atorvastatin  (LIPITOR ) 80 MG tablet TAKE 1 TABLET BY MOUTH EVERY DAY 90 tablet 1   clotrimazole  (LOTRIMIN ) 1 % cream Apply 1 Application topically 2 (two) times daily. 30 g 3   FLUoxetine  (PROZAC ) 40 MG capsule Take 1 capsule (40 mg total) by mouth daily. 90 capsule 1   gabapentin  (NEURONTIN ) 300 MG capsule Take 2 capsules (600 mg total) by mouth every evening. 180 capsule 3   hydrochlorothiazide  (HYDRODIURIL ) 25 MG tablet Take 1 tablet (25 mg total) by mouth daily. 90 tablet 0   HYDROcodone -acetaminophen  (NORCO/VICODIN) 5-325 MG tablet Take 0.5 tablets by mouth every 6 (six) hours as needed for severe pain.     Interferon Beta-1b  (BETASERON /EXTAVIA ) 0.3 MG KIT injection Inject 0.25 mg into the skin every other day. 42 each 3   levothyroxine  (SYNTHROID ) 88 MCG tablet Take 1 tablet (88 mcg total) by mouth daily. 90 tablet 3   losartan  (COZAAR ) 100 MG tablet TAKE 1 TABLET BY MOUTH EVERY DAY 90 tablet 1   mometasone  (NASONEX ) 50 MCG/ACT nasal spray Place 2 sprays into the nose daily as  needed (allergies).     Polyethyl Glycol-Propyl  Glycol (SYSTANE ULTRA OP) Place 1 drop into the right eye daily as needed (dry eye).      senna (SENOKOT) 8.6 MG tablet Take 2 tablets by mouth at bedtime. (Patient taking differently: Take 1 tablet by mouth daily as needed.)     UNABLE TO FIND Med Name: Triamcinolone cream     Cholecalciferol (VITAMIN D3) 50 MCG (2000 UT) capsule Take 2,000 Units by mouth daily. (Patient not taking: Reported on 02/06/2024)     Vitamin D , Ergocalciferol , (DRISDOL ) 1.25 MG (50000 UNIT) CAPS capsule Take 1 capsule (50,000 Units total) by mouth every 7 (seven) days. (Patient not taking: Reported on 02/06/2024) 12 capsule 1   No facility-administered medications prior to visit.    PAST MEDICAL HISTORY: Past Medical History:  Diagnosis Date   AAA (abdominal aortic aneurysm) (HCC)    Allergic rhinitis    Anxiety    Arthritis    back   Chronic hoarseness    Colon polyps    COPD (chronic obstructive pulmonary disease) (HCC)    Dr. Geronimo   Coronary artery disease    nonobstructive   Dyslipidemia    Gastric bleed 10/2021   stomach ulcers   Gastritis    GERD (gastroesophageal reflux disease)    History of hiatal hernia    Hyperlipidemia    Hypertension    Hypothyroidism    MS (multiple sclerosis) (HCC)    Neuromuscular disorder (HCC)    MS   Obesity    RBBB    Stomach ulcer from aspirin /ibuprofen-like drugs (NSAID's)     PAST SURGICAL HISTORY: Past Surgical History:  Procedure Laterality Date   ABDOMINAL AORTIC ENDOVASCULAR STENT GRAFT N/A 10/31/2022   Procedure: ABDOMINAL AORTIC ENDOVASCULAR STENT GRAFT;  Surgeon: Sheree Penne Bruckner, MD;  Location: [facility] OR;  Service: Vascular;  Laterality: N/A;   ABDOMINAL HYSTERECTOMY     Endometriosis   BARIATRIC SURGERY  09/07/2008   lap band   CARPAL TUNNEL RELEASE Bilateral    CATARACT EXTRACTION Right 11/08/2017   CATARACT EXTRACTION Left 11/22/2017   CATARACT EXTRACTION, BILATERAL      Toric lenses for astigmatism   CHOLECYSTECTOMY      ESOPHAGOGASTRODUODENOSCOPY (EGD) WITH PROPOFOL  N/A 10/17/2021   Procedure: ESOPHAGOGASTRODUODENOSCOPY (EGD) WITH PROPOFOL ;  Surgeon: Burnette Fallow, MD;  Location: [facility] ENDOSCOPY;  Service: Gastroenterology;  Laterality: N/A;   HIATAL HERNIA REPAIR  09/07/2008   LEFT HEART CATH AND CORONARY ANGIOGRAPHY N/A 07/09/2019   Procedure: LEFT HEART CATH AND CORONARY ANGIOGRAPHY;  Surgeon: Claudene Victory ORN, MD;  Location: [facility] INVASIVE CV LAB;  Service: Cardiovascular;  Laterality: N/A;   TONSILLECTOMY     ULTRASOUND GUIDANCE FOR VASCULAR ACCESS Bilateral 10/31/2022   Procedure: ULTRASOUND GUIDANCE FOR VASCULAR ACCESS;  Surgeon: Sheree Penne Bruckner, MD;  Location: [facility] OR;  Service: Vascular;  Laterality: Bilateral;   VULVECTOMY PARTIAL N/A 02/27/2023   Procedure: VULVECTOMY PARTIAL;  Surgeon: Eldonna Mays, MD;  Location: [facility] ORS;  Service: Gynecology;  Laterality: N/A;    FAMILY HISTORY: Family History  Problem Relation Age of Onset   Stroke Mother 8   Heart disease Mother        congenital heart defect?   COPD Father    Heart disease Father    AAA (abdominal aortic aneurysm) Father    COPD Sister    Arthritis Sister    AAA (abdominal aortic aneurysm) Sister    Dementia Sister    Other Sister  brain injury   Multiple sclerosis Neg Hx    Breast cancer Neg Hx    Ovarian cancer Neg Hx    Endometrial cancer Neg Hx    Colon cancer Neg Hx     SOCIAL HISTORY: Social History   Socioeconomic History   Marital status: Widowed    Spouse name: Ozell   Number of children: 2   Years of education: 16   Highest education level: Not on file  Occupational History   Occupation: retired  Tobacco Use   Smoking status: Former    Current packs/day: 0.00    Average packs/day: 2.0 packs/day for 40.0 years (80.0 ttl pk-yrs)    Types: Cigarettes    Start date: 04/05/1967    Quit date: 04/05/2007    Years since quitting: 16.8    Passive exposure: Current (Husband)   Smokeless tobacco:  Never  Vaping Use   Vaping status: Not on file  Substance and Sexual Activity   Alcohol  use: No   Drug use: No   Sexual activity: Not Currently    Partners: Male  Other Topics Concern   Not on file  Social History Narrative   Patient is right handed,resides alone (son is in and out)   Right handed   Social Drivers of Health   Financial Resource Strain: Not on file  Food Insecurity: No Food Insecurity (11/02/2022)   Hunger Vital Sign    Worried About Running Out of Food in the Last Year: Never true    Ran Out of Food in the Last Year: Never true  Transportation Needs: No Transportation Needs (11/02/2022)   PRAPARE - Administrator, Civil Service (Medical): No    Lack of Transportation (Non-Medical): No  Physical Activity: Inactive (04/19/2018)   Exercise Vital Sign    Days of Exercise per Week: 0 days    Minutes of Exercise per Session: 0 min  Stress: No Stress Concern Present (04/19/2018)   [employer] of Occupational Health - Occupational Stress Questionnaire    Feeling of Stress : Not at all  Social Connections: Not on file  Intimate Partner Violence: Not At Risk (02/14/2022)   Humiliation, Afraid, Rape, and Kick questionnaire    Fear of Current or Ex-Partner: No    Emotionally Abused: No    Physically Abused: No    Sexually Abused: No      PHYSICAL EXAM  Vitals:   02/06/24 0913  BP: 121/69  Pulse: 78  Weight: 196 lb (88.9 kg)  Height: 5' 6 (1.676 m)    Body mass index is 31.64 kg/m.     09/25/2016    9:28 AM  MMSE - Mini Mental State Exam  Orientation to time 5   Orientation to Place 5   Registration 3   Attention/ Calculation 5   Recall 3   Language- name 2 objects 2   Language- repeat 1  Language- follow 3 step command 3   Language- read & follow direction 1   Write a sentence 1   Copy design 1   Total score 30      Data saved with a previous flowsheet row definition     Generalized: Well developed, in no acute distress    Neurological examination  Mentation: Alert oriented to time, place, history taking. Follows all commands speech and language fluent Cranial nerve II-XII: Pupils were equal round reactive to light.  Head turning and shoulder shrug  were normal and symmetric. Motor: The motor testing reveals 5  over 5 strength of all 4 extremities. Good symmetric motor tone is noted throughout.  Sensory: Sensory testing is intact to soft touch on all 4 extremities. No evidence of extinction is noted.  Coordination: Cerebellar testing reveals good finger-nose-finger and heel-to-shin bilaterally.  Mild tremor noted in the upper extremities. Gait and station: Patient uses a rollator when ambulating.   DIAGNOSTIC DATA (LABS, IMAGING, TESTING) - I reviewed patient records, labs, notes, testing and imaging myself where available.  Lab Results  Component Value Date   WBC 5.4 11/22/2023   HGB 13.0 11/22/2023   HCT 39.1 11/22/2023   MCV 87.6 11/22/2023   PLT 136.0 (L) 11/22/2023      Component Value Date/Time   NA 131 (L) 11/22/2023 1044   NA 131 (L) 01/11/2022 1008   K 4.3 11/22/2023 1044   CL 98 11/22/2023 1044   CO2 28 11/22/2023 1044   GLUCOSE 84 11/22/2023 1044   BUN 17 11/22/2023 1044   BUN 12 01/11/2022 1008   CREATININE 1.12 11/22/2023 1044   CREATININE 1.04 05/13/2014 0840   CALCIUM  9.3 11/22/2023 1044   PROT 6.4 11/22/2023 1044   PROT 6.2 08/26/2019 0731   ALBUMIN 3.8 11/22/2023 1044   ALBUMIN 3.9 08/26/2019 0731   AST 40 (H) 11/22/2023 1044   ALT 21 11/22/2023 1044   ALKPHOS 97 11/22/2023 1044   BILITOT 0.6 11/22/2023 1044   BILITOT 0.3 08/26/2019 0731   GFRNONAA 57 (L) 02/19/2023 1145   GFRNONAA 57 (L) 05/13/2014 0840   GFRAA 67 07/03/2019 1515   GFRAA 66 05/13/2014 0840   Lab Results  Component Value Date   CHOL 122 11/22/2023   HDL 51.10 11/22/2023   LDLCALC 54 11/22/2023   TRIG 84.0 11/22/2023   CHOLHDL 2 11/22/2023   Lab Results  Component Value Date   HGBA1C 5.9  09/25/2016   Lab Results  Component Value Date   VITAMINB12 256 05/19/2022   Lab Results  Component Value Date   TSH 3.29 11/22/2023      ASSESSMENT AND PLAN 73 y.o. year old female  has a past medical history of AAA (abdominal aortic aneurysm) (HCC), Allergic rhinitis, Anxiety, Arthritis, Chronic hoarseness, Colon polyps, COPD (chronic obstructive pulmonary disease) (HCC), Coronary artery disease, Dyslipidemia, Gastric bleed (10/2021), Gastritis, GERD (gastroesophageal reflux disease), History of hiatal hernia, Hyperlipidemia, Hypertension, Hypothyroidism, MS (multiple sclerosis) (HCC), Neuromuscular disorder (HCC), Obesity, RBBB, and Stomach ulcer from aspirin /ibuprofen-like drugs (NSAID's). here with:  Multiple sclerosis falls  Continue Betaseron  Will repeat CMP to check kidney function pending those results will order MRI brain with or without contrast. Patient is encouraged to use her rollator at all times. FU 6-8 months are sooner if needed     Duwaine Russell, MSN, NP-C 02/06/2024, 9:35 AM [facility] [address] [place], [place] [address] [phone]  The patient's condition requires frequent monitoring and adjustments in the treatment plan, reflecting the ongoing complexity of care.  This provider is the continuing focal point for all needed services for this condition.

## 2024-02-07 LAB — COMPREHENSIVE METABOLIC PANEL WITH GFR
ALT: 21 IU/L (ref 0–32)
AST: 39 IU/L (ref 0–40)
Albumin: 3.8 g/dL (ref 3.8–4.8)
Alkaline Phosphatase: 122 IU/L — ABNORMAL HIGH (ref 44–121)
BUN/Creatinine Ratio: 10 — ABNORMAL LOW (ref 12–28)
BUN: 12 mg/dL (ref 8–27)
Bilirubin Total: 0.5 mg/dL (ref 0.0–1.2)
CO2: 21 mmol/L (ref 20–29)
Calcium: 9.4 mg/dL (ref 8.7–10.3)
Chloride: 94 mmol/L — ABNORMAL LOW (ref 96–106)
Creatinine, Ser: 1.19 mg/dL — ABNORMAL HIGH (ref 0.57–1.00)
Globulin, Total: 2.7 g/dL (ref 1.5–4.5)
Glucose: 86 mg/dL (ref 70–99)
Potassium: 4.1 mmol/L (ref 3.5–5.2)
Sodium: 133 mmol/L — ABNORMAL LOW (ref 134–144)
Total Protein: 6.5 g/dL (ref 6.0–8.5)
eGFR: 48 mL/min/1.73 — ABNORMAL LOW (ref >59)

## 2024-02-11 ENCOUNTER — Ambulatory Visit: Payer: Self-pay | Admitting: Adult Health

## 2024-02-11 ENCOUNTER — Telehealth: Payer: Self-pay | Admitting: Adult Health

## 2024-02-11 DIAGNOSIS — Z5181 Encounter for therapeutic drug level monitoring: Secondary | ICD-10-CM

## 2024-02-11 DIAGNOSIS — G35 Multiple sclerosis: Secondary | ICD-10-CM

## 2024-02-11 NOTE — Telephone Encounter (Signed)
 no auth required sent to GI [phone]

## 2024-02-13 ENCOUNTER — Other Ambulatory Visit: Payer: Self-pay | Admitting: Family Medicine

## 2024-02-13 DIAGNOSIS — I10 Essential (primary) hypertension: Secondary | ICD-10-CM

## 2024-02-13 NOTE — Telephone Encounter (Signed)
 Pt wants to know what difference the MRI would show without contrast in her particular case.

## 2024-02-14 ENCOUNTER — Telehealth: Payer: Self-pay

## 2024-02-14 NOTE — Telephone Encounter (Signed)
 I called the patient.  And explained the difference between MRI of the brain with and without contrast.

## 2024-02-14 NOTE — Telephone Encounter (Signed)
 Copied from CRM [id number]. Topic: Clinical - Medical Advice >> Feb 14, 2024 10:21 AM Berneda FALCON wrote: Reason for CRM: Pt states she needs to have CT Scan for brain next week and neurologist wants to know if she should stop the vitamin D  until then. States she is not on MyChart so please call her.  Pt callback is [phone]

## 2024-02-14 NOTE — Telephone Encounter (Signed)
Spoke w/ Pt- informed of PCP recommendations. Pt verbalized understanding.  

## 2024-02-23 ENCOUNTER — Ambulatory Visit
Admission: RE | Admit: 2024-02-23 | Discharge: 2024-02-23 | Disposition: A | Discharge status: Home / Self Care | Source: Ambulatory Visit | Attending: Adult Health | Admitting: Adult Health

## 2024-02-23 DIAGNOSIS — G35 Multiple sclerosis: Secondary | ICD-10-CM

## 2024-02-26 ENCOUNTER — Other Ambulatory Visit: Payer: Self-pay | Admitting: Family Medicine

## 2024-02-26 ENCOUNTER — Other Ambulatory Visit: Payer: Self-pay | Admitting: Nurse Practitioner

## 2024-02-26 ENCOUNTER — Ambulatory Visit: Payer: Self-pay | Admitting: Adult Health

## 2024-02-26 DIAGNOSIS — F419 Anxiety disorder, unspecified: Secondary | ICD-10-CM

## 2024-02-27 NOTE — Telephone Encounter (Signed)
-----   Message from [facility] sent at 02/26/2024  7:52 AM EDT ----- Please call patient and let her know scan is stable. No significant change compared to previous imaging ----- Message ----- From: Margaret Eduard SAUNDERS, MD Sent: 02/25/2024  11:39 PM EDT To: Duwaine Russell, NP

## 2024-02-27 NOTE — Telephone Encounter (Signed)
 Called pt and LVM (ok per DPR) informing her of MRI results as noted below by [facility] NP. Left office number for call back if needed.

## 2024-02-28 NOTE — Telephone Encounter (Signed)
 Pt returned call.  I relayed her MRI results no change from previous.  She is asking if she is able to stop her betaseron  if not she will be needing to reapply for her pt assistance (currently ends this year).

## 2024-03-04 ENCOUNTER — Ambulatory Visit: Payer: PPO | Admitting: Adult Health

## 2024-03-24 DIAGNOSIS — M47896 Other spondylosis, lumbar region: Secondary | ICD-10-CM | POA: Diagnosis not present

## 2024-03-24 DIAGNOSIS — M549 Dorsalgia, unspecified: Secondary | ICD-10-CM | POA: Diagnosis not present

## 2024-03-24 DIAGNOSIS — Z79891 Long term (current) use of opiate analgesic: Secondary | ICD-10-CM | POA: Diagnosis not present

## 2024-04-04 ENCOUNTER — Ambulatory Visit: Attending: Cardiology | Admitting: Cardiology

## 2024-04-04 ENCOUNTER — Encounter: Payer: Self-pay | Admitting: Cardiology

## 2024-04-04 VITALS — BP 102/66 | HR 60 | Ht 66.0 in | Wt 190.0 lb

## 2024-04-04 DIAGNOSIS — I714 Abdominal aortic aneurysm, without rupture, unspecified: Secondary | ICD-10-CM | POA: Diagnosis not present

## 2024-04-04 DIAGNOSIS — E785 Hyperlipidemia, unspecified: Secondary | ICD-10-CM | POA: Diagnosis not present

## 2024-04-04 DIAGNOSIS — I5032 Chronic diastolic (congestive) heart failure: Secondary | ICD-10-CM | POA: Diagnosis not present

## 2024-04-04 DIAGNOSIS — I2510 Atherosclerotic heart disease of native coronary artery without angina pectoris: Secondary | ICD-10-CM | POA: Diagnosis not present

## 2024-04-04 MED ORDER — ISOSORBIDE MONONITRATE ER 30 MG PO TB24: 30.0000 mg | ORAL_TABLET | Freq: Every day | ORAL | 3 refills | Status: DC

## 2024-04-04 MED ORDER — NITROGLYCERIN 0.4 MG SL SUBL: 0.4000 mg | SUBLINGUAL_TABLET | SUBLINGUAL | 4 refills | Status: AC | PRN

## 2024-04-04 NOTE — Patient Instructions (Signed)
 Medication Instructions:  Please start Isosorbide 30 mg once daily. May use SL Nitroglycerin  0.4 mg as needed for chest pain. Continue all other medications as listed.  *If you need a refill on your cardiac medications before your next appointment, please call your pharmacy*  Follow-Up: At [facility], you and your health needs are our priority.  As part of our continuing mission to provide you with exceptional heart care, our providers are all part of one team.  This team includes your primary Cardiologist (physician) and Advanced Practice Providers or APPs (Physician Assistants and Nurse Practitioners) who all work together to provide you with the care you need, when you need it.  Your next appointment:   1 year(s)  Provider:   One of our Advanced Practice Providers (APPs): Morse Clause, PA-C  Lamarr Satterfield, NP Miriam Shams, NP  Olivia Pavy, PA-C Josefa Beauvais, NP  Leontine Salen, PA-C Orren Fabry, PA-C  [place], PA-C Ernest Dick, NP  Damien Braver, NP Jon Hails, PA-C  Waddell Donath, PA-C    Dayna Dunn, PA-C  Scott Weaver, PA-C Lum Louis, NP Katlyn West, NP Callie Goodrich, PA-C  Xika Zhao, NP Sheng Haley, PA-C    Kathleen Johnson, PA-C       We recommend signing up for the patient portal called MyChart.  Sign up information is provided on this After Visit Summary.  MyChart is used to connect with patients for Virtual Visits (Telemedicine).  Patients are able to view lab/test results, encounter notes, upcoming appointments, etc.  Non-urgent messages can be sent to your provider as well.   To learn more about what you can do with MyChart, go to [email or web address].

## 2024-04-04 NOTE — Progress Notes (Signed)
 Cardiology Office Note:  .   Date:  04/04/2024  ID:  Donika Butner, DOB 07-13-1950, MRN [id number] PCP: Antonio Meth, Jamee SAUNDERS, DO  [facility] Providers Cardiologist:  None     History of Present Illness: Cassandra Rios   Tavon Corriher is a 73 y.o. female Discussed the use of AI scribe software   History of Present Illness Hermione Havlicek is a 73 year old female with chronic diastolic heart failure and coronary artery disease who presents for follow-up.  She has chronic diastolic heart failure, NYHA class II, with an echocardiogram from Oct 18, 2021, showing an ejection fraction of 70-75% and grade two diastolic dysfunction. She is not currently using aspirin  due to a prior gastrointestinal bleed. Her current medication includes hydrochlorothiazide  25 mg daily, and her serum sodium level is 133 mmol/L. She has a history of intolerance to SGLT2 inhibitors.  She has a history of moderate non-obstructive coronary artery disease identified on cardiac catheterization in 2021, with a nuclear stress test in April 2024 indicating low risk. She experiences chest discomfort, described as 'quivering like' rather than sharp, which worsens with stress rather than exertion. The recent passing of her husband in March has been a significant stressor. She is not currently able to walk due to back problems.  Her back issues have been managed with injections and nerve burning by Dr. Bonner, but these treatments have not provided relief. She has consulted with neurosurgeons Dr. Mora and Dr. Colon, who advised against surgery due to the complexity and lack of guaranteed pain relief. She is exploring low-dose radiation therapy for arthritis in joints and spine but has not received a response from the cancer center.  She underwent an abdominal aortic aneurysm repair with EVAR in May 2024 and follows up with vascular surgery. She reports that at her follow-up visit, the aneurysm had decreased in size and  there was no evidence of stent migration according to her recollection.  She is on atorvastatin  80 mg daily for hyperlipidemia, with a prior LDL level of 54 mg/dL on November 22, 2023. She has lost approximately 80 pounds, attributing this to changes in eating habits and stress following her husband's death. She reports difficulty in getting out and walking due to back pain.      Studies Reviewed: .        Results LABS Serum sodium: 133 LDL: 54 (11/22/2023)  RADIOLOGY CT scan: Mild to moderate coronary artery disease (2019)  DIAGNOSTIC Echocardiogram: EF 70-75%, Grade 2 diastolic dysfunction (10/18/2021) Cardiac catheterization: Moderate non-obstructive coronary artery disease (2021) Nuclear stress test: Low risk (April 2024) Risk Assessment/Calculations:            Physical Exam:   VS:  BP 102/66   Pulse 60   Ht 5' 6 (1.676 m)   Wt 190 lb (86.2 kg)   SpO2 93%   BMI 30.67 kg/m    Wt Readings from Last 3 Encounters:  04/04/24 190 lb (86.2 kg)  02/06/24 196 lb (88.9 kg)  01/30/24 195 lb 12.8 oz (88.8 kg)    GEN: Well nourished, well developed in no acute distress NECK: No JVD; No carotid bruits CARDIAC: RRR, no murmurs, no rubs, no gallops RESPIRATORY:  Clear to auscultation without rales, wheezing or rhonchi  ABDOMEN: Soft, non-tender, non-distended EXTREMITIES:  No edema; No deformity   ASSESSMENT AND PLAN: .    Assessment and Plan Assessment & Plan Coronary artery disease, non-obstructive, with angina Moderate non-obstructive coronary artery  disease with angina, exacerbated by stress rather than exertion. Previous cardiac catheterization in 2021 showed 45% stenosis in the right coronary artery, 70% in the circumflex, and 70% in the LAD. Recent nuclear stress test in April 2024 was low risk. Current symptoms include non-sharp, quivering chest pain. - Prescribed isosorbide 30 mg once daily in the morning. - Prescribed nitroglycerin  0.4 mg PRN for chest pain. - Will  consider stress test if symptoms persist or worsen. - Will discuss potential for angiogram or heart catheterization if symptoms do not improve with medication.  Chronic diastolic heart failure, NYHA class II Chronic diastolic heart failure, NYHA class II, with an ejection fraction of 70-75% and grade 2 diastolic dysfunction. Intolerant to SGLT2 inhibitors. Serum sodium level is 133. - Continue hydrochlorothiazide  25 mg daily.  Abdominal aortic aneurysm, status post EVAR Status post EVAR for abdominal aortic aneurysm in May 2024. Follow-up with vascular surgery is ongoing. - Continue follow-up with vascular surgery.  Hyperlipidemia Managed with atorvastatin  80 mg daily. Recent LDL level was 54, indicating good control. - Continue atorvastatin  80 mg daily.         Dispo: 1 yr  Signed, Oneil Parchment, MD

## 2024-04-22 NOTE — Progress Notes (Signed)
 Site of osteoarthritis:   How long have you had pain? Patient has been treated by Dr. Bonner.   What over the counter or prescription medications have you tried? Steroid injections and was told that she was not a candidate for surgery.  Does anything make the pain better or worse?   Hydrocodone  makes pain slightly better.  Reports pain worsens when she walks, stands, pushes or pulls.     Ambulatory status? Walker? Wheelchair?: Ambulatory with Assistance  SAFETY ISSUES: Prior radiation? no Pacemaker/ICD? no Possible current pregnancy? no Is the patient on methotrexate? no    Current Complaints / other details:    BP 128/70 (BP Location: Left Arm, Patient Position: Sitting)   Pulse 72   Temp (!) 96 F (35.6 C) (Temporal)   Resp 18   Ht 5' 6 (1.676 m)   Wt 189 lb 6 oz (85.9 kg)   SpO2 100%   BMI 30.57 kg/m

## 2024-04-23 ENCOUNTER — Ambulatory Visit

## 2024-04-23 ENCOUNTER — Ambulatory Visit: Admitting: Radiation Oncology

## 2024-04-23 ENCOUNTER — Ambulatory Visit
Admission: RE | Admit: 2024-04-23 | Discharge: 2024-04-23 | Disposition: A | Discharge status: Home / Self Care | Source: Ambulatory Visit | Attending: Radiation Oncology | Admitting: Radiation Oncology

## 2024-04-23 ENCOUNTER — Telehealth: Payer: Self-pay | Admitting: Radiation Oncology

## 2024-04-23 VITALS — BP 128/70 | HR 72 | Temp 96.0°F | Resp 18 | Ht 66.0 in | Wt 189.4 lb

## 2024-04-23 DIAGNOSIS — I4510 Unspecified right bundle-branch block: Secondary | ICD-10-CM | POA: Diagnosis not present

## 2024-04-23 DIAGNOSIS — M47816 Spondylosis without myelopathy or radiculopathy, lumbar region: Secondary | ICD-10-CM

## 2024-04-23 DIAGNOSIS — E785 Hyperlipidemia, unspecified: Secondary | ICD-10-CM | POA: Insufficient documentation

## 2024-04-23 DIAGNOSIS — G35D Multiple sclerosis, unspecified: Secondary | ICD-10-CM | POA: Diagnosis not present

## 2024-04-23 DIAGNOSIS — Z8711 Personal history of peptic ulcer disease: Secondary | ICD-10-CM | POA: Insufficient documentation

## 2024-04-23 DIAGNOSIS — I714 Abdominal aortic aneurysm, without rupture, unspecified: Secondary | ICD-10-CM | POA: Diagnosis not present

## 2024-04-23 DIAGNOSIS — Z7951 Long term (current) use of inhaled steroids: Secondary | ICD-10-CM | POA: Diagnosis not present

## 2024-04-23 DIAGNOSIS — I10 Essential (primary) hypertension: Secondary | ICD-10-CM | POA: Insufficient documentation

## 2024-04-23 DIAGNOSIS — M129 Arthropathy, unspecified: Secondary | ICD-10-CM | POA: Diagnosis not present

## 2024-04-23 DIAGNOSIS — J449 Chronic obstructive pulmonary disease, unspecified: Secondary | ICD-10-CM | POA: Diagnosis not present

## 2024-04-23 DIAGNOSIS — Z923 Personal history of irradiation: Secondary | ICD-10-CM | POA: Diagnosis not present

## 2024-04-23 DIAGNOSIS — E039 Hypothyroidism, unspecified: Secondary | ICD-10-CM | POA: Insufficient documentation

## 2024-04-23 DIAGNOSIS — Z87891 Personal history of nicotine dependence: Secondary | ICD-10-CM | POA: Insufficient documentation

## 2024-04-23 DIAGNOSIS — Z860101 Personal history of adenomatous and serrated colon polyps: Secondary | ICD-10-CM | POA: Insufficient documentation

## 2024-04-23 DIAGNOSIS — I2510 Atherosclerotic heart disease of native coronary artery without angina pectoris: Secondary | ICD-10-CM | POA: Insufficient documentation

## 2024-04-23 DIAGNOSIS — Z79890 Hormone replacement therapy (postmenopausal): Secondary | ICD-10-CM | POA: Insufficient documentation

## 2024-04-23 DIAGNOSIS — K219 Gastro-esophageal reflux disease without esophagitis: Secondary | ICD-10-CM | POA: Diagnosis not present

## 2024-04-23 DIAGNOSIS — M47817 Spondylosis without myelopathy or radiculopathy, lumbosacral region: Secondary | ICD-10-CM

## 2024-04-23 DIAGNOSIS — Z79899 Other long term (current) drug therapy: Secondary | ICD-10-CM | POA: Diagnosis not present

## 2024-04-23 NOTE — Telephone Encounter (Signed)
 Was asked by Dr Shannon to call patient with an estimate for her radiation therapy.  Called patient and left voicemail that her out of pocket max for 2025 is $3400, she has paid $204.50, leaving her with a balance of $3195.50.  Her out of pocket max for 2026 is $3,900. Left my number to call back with any questions.

## 2024-04-23 NOTE — Progress Notes (Signed)
 Radiation Oncology         [phone] ________________________________  Initial Outpatient Consultation  Name: Cassandra Rios MRN: [id number]  Date: 04/23/2024  DOB: 16-Mar-1951  RR:Ontwz Chase, Jamee JONELLE ROSALEA Bonner Charlie, MD   REFERRING PHYSICIAN: Bonner Charlie, MD  DIAGNOSIS: The primary encounter diagnosis was Lumbar spondylosis. A diagnosis of Spondylosis of lumbosacral region, unspecified spinal osteoarthritis complication status was also pertinent to this visit.  Multilevel degenerative disc disease   HISTORY OF PRESENT ILLNESS::Cassandra Rios is a 73 y.o. female who is accompanied by her daughter. she is seen as a courtesy of Dr. Bonner for an opinion concerning radiation therapy as part of management for her spinal arthritis and multilevel degenerative disc disease. Patient has a history of spinal stenosis at the L4-5 level.    Patient has been treated with several injections and nerve burning Dr. Bonner, but these treatments have not provided prolonged relief.   She has consulted with neurosurgeons Dr. Mora and Dr. Colon for further pain management but was advised against surgery due to the complexity and lack of guaranteed pain relief.   Subsequently, Dr. 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 Eldonna.   PREVIOUS RADIATION THERAPY: No  PAST MEDICAL HISTORY:  Past Medical History:  Diagnosis Date   AAA (abdominal aortic aneurysm)    Allergic rhinitis    Anxiety    Arthritis    back   Chronic hoarseness    Colon polyps    COPD (chronic obstructive pulmonary disease) (HCC)    Dr. Geronimo   Coronary artery disease    nonobstructive   Dyslipidemia    Gastric bleed 10/2021   stomach ulcers   Gastritis    GERD (gastroesophageal reflux disease)    History  of hiatal hernia    Hyperlipidemia    Hypertension    Hypothyroidism    MS (multiple sclerosis)    Neuromuscular disorder (HCC)    MS   Obesity    RBBB    Stomach ulcer from aspirin /ibuprofen-like drugs (NSAID's)     PAST SURGICAL HISTORY: Past Surgical History:  Procedure Laterality Date   ABDOMINAL AORTIC ENDOVASCULAR STENT GRAFT N/A 10/31/2022   Procedure: ABDOMINAL AORTIC ENDOVASCULAR STENT GRAFT;  Surgeon: Sheree Penne Bruckner, MD;  Location: [facility] OR;  Service: Vascular;  Laterality: N/A;   ABDOMINAL HYSTERECTOMY     Endometriosis   BARIATRIC SURGERY  09/07/2008   lap band   CARPAL TUNNEL RELEASE Bilateral    CATARACT EXTRACTION Right 11/08/2017   CATARACT EXTRACTION Left 11/22/2017   CATARACT EXTRACTION, BILATERAL      Toric lenses for astigmatism   CHOLECYSTECTOMY     ESOPHAGOGASTRODUODENOSCOPY (EGD) WITH PROPOFOL  N/A 10/17/2021   Procedure: ESOPHAGOGASTRODUODENOSCOPY (EGD) WITH PROPOFOL ;  Surgeon: Burnette Fallow, MD;  Location: [facility] ENDOSCOPY;  Service: Gastroenterology;  Laterality: N/A;   HIATAL HERNIA REPAIR  09/07/2008   LEFT HEART CATH AND CORONARY ANGIOGRAPHY N/A 07/09/2019   Procedure: LEFT HEART CATH AND CORONARY ANGIOGRAPHY;  Surgeon: Claudene Victory ORN, MD;  Location: [facility] INVASIVE CV LAB;  Service: Cardiovascular;  Laterality: N/A;   TONSILLECTOMY     ULTRASOUND GUIDANCE FOR VASCULAR ACCESS Bilateral 10/31/2022   Procedure: ULTRASOUND GUIDANCE FOR VASCULAR ACCESS;  Surgeon: Sheree Penne Bruckner, MD;  Location: [facility] OR;  Service: Vascular;  Laterality:  Bilateral;   VULVECTOMY PARTIAL N/A 02/27/2023   Procedure: VULVECTOMY PARTIAL;  Surgeon: Eldonna Mays, MD;  Location: [facility] ORS;  Service: Gynecology;  Laterality: N/A;    FAMILY HISTORY:  Family History  Problem Relation Age of Onset   Stroke Mother 62   Heart disease Mother        congenital heart defect?   COPD Father    Heart disease Father    AAA (abdominal aortic aneurysm) Father    COPD Sister     Arthritis Sister    AAA (abdominal aortic aneurysm) Sister    Dementia Sister    Other Sister        brain injury   Multiple sclerosis Neg Hx    Breast cancer Neg Hx    Ovarian cancer Neg Hx    Endometrial cancer Neg Hx    Colon cancer Neg Hx     SOCIAL HISTORY:  Social History   Tobacco Use   Smoking status: Former    Current packs/day: 0.00    Average packs/day: 2.0 packs/day for 40.0 years (80.0 ttl pk-yrs)    Types: Cigarettes    Start date: 04/05/1967    Quit date: 04/05/2007    Years since quitting: 17.0    Passive exposure: Current (Husband)   Smokeless tobacco: Never  Substance Use Topics   Alcohol  use: No   Drug use: No    ALLERGIES:  Allergies  Allergen Reactions   Codeine  Itching and Nausea And Vomiting   Hydromorphone Hcl Nausea And Vomiting   Ibuprofen Other (See Comments)    stomach ulcers   Morphine Sulfate     makes my blood pressure bottom out   Metronidazole  Hives, Nausea And Vomiting and Rash    MEDICATIONS:  Current Outpatient Medications  Medication Sig Dispense Refill   acetaminophen  (TYLENOL ) 500 MG tablet Take 500-1,000 mg by mouth every 6 (six) hours as needed for moderate pain or headache.     atorvastatin  (LIPITOR ) 80 MG tablet TAKE 1 TABLET BY MOUTH EVERY DAY 90 tablet 1   Cholecalciferol (VITAMIN D3) 50 MCG (2000 UT) capsule Take 2,000 Units by mouth daily.     clotrimazole  (LOTRIMIN ) 1 % cream Apply 1 Application topically 2 (two) times daily. 30 g 3   FLUoxetine  (PROZAC ) 40 MG capsule Take 1 capsule (40 mg total) by mouth daily. 90 capsule 1   gabapentin  (NEURONTIN ) 300 MG capsule Take 2 capsules (600 mg total) by mouth every evening. 180 capsule 3   hydrochlorothiazide  (HYDRODIURIL ) 25 MG tablet Take 1 tablet (25 mg total) by mouth daily. 90 tablet 1   HYDROcodone -acetaminophen  (NORCO/VICODIN) 5-325 MG tablet Take 0.5 tablets by mouth every 6 (six) hours as needed for severe pain.     Interferon Beta-1b  (BETASERON /EXTAVIA ) 0.3  MG KIT injection Inject 0.25 mg into the skin every other day. 42 each 3   isosorbide mononitrate (IMDUR) 30 MG 24 hr tablet Take 1 tablet (30 mg total) by mouth daily. 90 tablet 3   levothyroxine  (SYNTHROID ) 88 MCG tablet Take 1 tablet (88 mcg total) by mouth daily. 90 tablet 3   losartan  (COZAAR ) 100 MG tablet TAKE 1 TABLET BY MOUTH EVERY DAY 90 tablet 1   mometasone  (NASONEX ) 50 MCG/ACT nasal spray Place 2 sprays into the nose daily as needed (allergies).     nitroGLYCERIN  (NITROSTAT ) 0.4 MG SL tablet Place 1 tablet (0.4 mg total) under the tongue every 5 (five) minutes as needed for chest pain. 25 tablet 4   Polyethyl  Glycol-Propyl Glycol (SYSTANE ULTRA OP) Place 1 drop into the right eye daily as needed (dry eye).      senna (SENOKOT) 8.6 MG tablet Take 2 tablets by mouth at bedtime. (Patient taking differently: Take 1 tablet by mouth daily as needed.)     UNABLE TO FIND Med Name: Triamcinolone cream     Vitamin D , Ergocalciferol , (DRISDOL ) 1.25 MG (50000 UNIT) CAPS capsule Take 1 capsule (50,000 Units total) by mouth every 7 (seven) days. (Patient not taking: Reported on 04/04/2024) 12 capsule 1   No current facility-administered medications for this encounter.    REVIEW OF SYSTEMS:  A 10+ POINT REVIEW OF SYSTEMS WAS OBTAINED including neurology, dermatology, psychiatry, cardiac, respiratory, lymph, extremities, GI, GU, musculoskeletal, constitutional, reproductive, HEENT.  She reports pain that is primarily focused in the lower lumbar and upper sacrum area.  Ambulates with the assistance of a rolling walker.   PHYSICAL EXAM:  height is 5' 6 (1.676 m) and weight is 189 lb 6 oz (85.9 kg). Her temporal temperature is 96 F (35.6 C) (abnormal). Her blood pressure is 128/70 and her pulse is 72. Her respiration is 18 and oxygen  saturation is 100%.   General: Alert and oriented, in no acute distress,  HEENT: Head is normocephalic. Extraocular movements are intact.  Neck: Neck is supple, no  palpable cervical or supraclavicular lymphadenopathy. Heart: Regular in rate and rhythm with no murmurs, rubs, or gallops. Chest: Clear to auscultation bilaterally, with no rhonchi, wheezes, or rales. Abdomen: Soft, nontender, nondistended, with no rigidity or guarding. Extremities: No cyanosis or edema. Lymphatics: see Neck Exam Skin: No concerning lesions. Musculoskeletal: symmetric strength and muscle tone throughout.  Able to step up on the examination table Neurologic: Cranial nerves II through XII are grossly intact. No obvious focalities. Speech is fluent. Coordination is intact. Psychiatric: Judgment and insight are intact. Affect is appropriate.   ECOG = 1  0 - Asymptomatic (Fully active, able to carry on all predisease activities without restriction)  1 - Symptomatic but completely ambulatory (Restricted in physically strenuous activity but ambulatory and able to carry out work of a light or sedentary nature. For example, light housework, office work)  2 - Symptomatic, <50% in bed during the day (Ambulatory and capable of all self care but unable to carry out any work activities. Up and about more than 50% of waking hours)  3 - Symptomatic, >50% in bed, but not bedbound (Capable of only limited self-care, confined to bed or chair 50% or more of waking hours)  4 - Bedbound (Completely disabled. Cannot carry on any self-care. Totally confined to bed or chair)  5 - Death   Raylene MM, Creech RH, Tormey DC, et al. [phone]). Toxicity and response criteria of the [facility]. Am. DOROTHA Bridges. Oncol. 5 (6): 649-55  LABORATORY DATA:  Lab Results  Component Value Date   WBC 5.4 11/22/2023   HGB 13.0 11/22/2023   HCT 39.1 11/22/2023   MCV 87.6 11/22/2023   PLT 136.0 (L) 11/22/2023   NEUTROABS 3.6 11/22/2023   Lab Results  Component Value Date   NA 133 (L) 02/06/2024   K 4.1 02/06/2024   CL 94 (L) 02/06/2024   CO2 21 02/06/2024   GLUCOSE 86 02/06/2024   BUN  12 02/06/2024   CREATININE 1.19 (H) 02/06/2024   CALCIUM  9.4 02/06/2024      RADIOGRAPHY: No results found.    IMPRESSION: Multilevel degenerative disc disease/osteoarthritis  She is symptomatic with pain in the  lumbosacral area.  This appears to be most significant in the lower lumbar spine and upper sacrum area.  She would be a good candidate for low-dose radiation therapy in an attempt to improve her pain related to osteoarthritis.  Today, I talked to the patient and her daughter about the findings and work-up thus far.  We discussed the natural history of osteoarthritis and general treatment, highlighting the role of radiotherapy in the management.  We discussed the available radiation techniques, and focused on the details of logistics and delivery.  We reviewed the anticipated acute and late sequelae associated with radiation in this setting.  The patient was encouraged to ask questions that I answered to the best of my ability.  A patient consent form was discussed and signed.  We retained a copy for our records.  The patient would like to proceed with radiation and will be scheduled for CT simulation.  PLAN: Due to scheduling issues the patient would prefer that her radiation start after the first of the year.  She will be simulated in mid December with treatments to begin January 4.  She will receive 6 low-dose radiation treatments directed at the lumbosacral spine area.   30 minutes of total time was spent for this patient encounter, including preparation, face-to-face counseling with the patient and coordination of care, physical exam, and documentation of the encounter.   ------------------------------------------------  Lynwood CHARM Nasuti, PhD, MD  This document serves as a record of services personally performed by Lynwood Nasuti, MD. It was created on his behalf by Reymundo Cartwright, a trained medical scribe. The creation of this record is based on the scribe's personal observations and  the provider's statements to them. This document has been checked and approved by the attending provider.

## 2024-04-28 ENCOUNTER — Telehealth: Payer: Self-pay | Admitting: Adult Health

## 2024-04-28 NOTE — Telephone Encounter (Signed)
 Pt called to  inform Nurse about  dropping off forms for MD  to fill out . Pt stated she had 2 different forms that she will need Md /NP To fill out and fax over . Pt stated she wasn't;t sure if she can come  in today or not . Pt also wanted a call back to go over what she need done as far as her insurance .

## 2024-04-29 NOTE — Telephone Encounter (Signed)
 I called the patient and she plans to come in next week to drop off a disability form and betaseron  MS medication form. Patient stated [facility] is more understanding of her condition than her PCP that is why she request [facility] to fill out both forms.

## 2024-05-06 ENCOUNTER — Ambulatory Visit

## 2024-05-08 NOTE — Telephone Encounter (Signed)
 Received betaseron  reenrollment application.  Fax received confirmed [phone].

## 2024-05-12 NOTE — Telephone Encounter (Signed)
 noted

## 2024-05-12 NOTE — Telephone Encounter (Signed)
 Cassandra Rios

## 2024-05-14 ENCOUNTER — Other Ambulatory Visit: Payer: Self-pay | Admitting: *Deleted

## 2024-05-14 MED ORDER — INTERFERON BETA-1B 0.3 MG ~~LOC~~ KIT: 0.2500 mg | PACK | SUBCUTANEOUS | 3 refills | Status: DC

## 2024-05-14 MED ORDER — INTERFERON BETA-1B 0.3 MG ~~LOC~~ KIT: 0.2500 mg | PACK | SUBCUTANEOUS | 3 refills | Status: AC

## 2024-05-14 NOTE — Telephone Encounter (Signed)
 Pt called to informed that she would not need MD to fill out for short term Disability  for Pt insurance . Pt stated  insurance doesn't allow   that and therefore the forms doesn't have to be filled out , Just forms for [employer] .

## 2024-05-14 NOTE — Addendum Note (Signed)
 Addended by: NEYSA NENA RAMAN on: 05/14/2024 11:09 AM   Modules accepted: Orders

## 2024-05-15 NOTE — Progress Notes (Signed)
 Betaseron  prescription Order faxed to [facility] [phone] [facility] . Fax confirmation received.

## 2024-05-19 ENCOUNTER — Ambulatory Visit: Admitting: Psychiatry

## 2024-05-19 ENCOUNTER — Ambulatory Visit

## 2024-05-19 ENCOUNTER — Telehealth: Payer: Self-pay | Admitting: *Deleted

## 2024-05-19 VITALS — Ht 66.0 in | Wt 197.0 lb

## 2024-05-19 DIAGNOSIS — M810 Age-related osteoporosis without current pathological fracture: Secondary | ICD-10-CM

## 2024-05-19 DIAGNOSIS — Z1231 Encounter for screening mammogram for malignant neoplasm of breast: Secondary | ICD-10-CM

## 2024-05-19 DIAGNOSIS — Z0000 Encounter for general adult medical examination without abnormal findings: Secondary | ICD-10-CM

## 2024-05-19 NOTE — Progress Notes (Signed)
 Chief Complaint  Patient presents with   Medicare Wellness     Subjective:   Cassandra Rios is a 73 y.o. female who presents for a Medicare Annual Wellness Visit.  Visit info / Clinical Intake: Medicare Wellness Visit Type:: Subsequent Annual Wellness Visit Persons participating in visit and providing information:: patient Medicare Wellness Visit Mode:: Telephone If telephone:: video declined Since this visit was completed virtually, some vitals may be partially provided or unavailable. Missing vitals are due to the limitations of the virtual format.: Unable to obtain vitals - no equipment If Telephone or Video please confirm:: I connected with patient using audio/video enable telemedicine. I verified patient identity with two identifiers, discussed telehealth limitations, and patient agreed to proceed. Patient Location:: home Provider Location:: office Interpreter Needed?: No Pre-visit prep was completed: yes AWV questionnaire completed by patient prior to visit?: no Living arrangements:: with family/others (lives with son) Patient's Overall Health Status Rating: (!) fair Typical amount of pain: (!) a lot (has lower back pain every day and intensity increases or decreases depending on activity I am doing.) Does pain affect daily life?: (!) yes Are you currently prescribed opioids?: (!) yes  Dietary Habits and Nutritional Risks How many meals a day?: 2 Eats fruit and vegetables daily?: yes (quality fruits have been harder to find) Most meals are obtained by: preparing own meals In the last 2 weeks, have you had any of the following?: (!) nausea, vomiting, diarrhea (not to a great extent but wonders if Vit D gummies can contribute to this and will discuss at upcoming appt.) Diabetic:: no  Functional Status Activities of Daily Living (to include ambulation/medication): Independent Ambulation: Independent with device- listed below Home Assistive Devices/Equipment: Walker  (specify Type); Wheelchair (wheelchair for long distances if needed) Medication Administration: Independent Home Management (perform basic housework or laundry): Independent Manage your own finances?: yes Primary transportation is: family / friends; driving (sharing car with family currently) Concerns about vision?: no *vision screening is required for WTM* (Up to date with retina specialist, Dr Jarold) Concerns about hearing?: no  Fall Screening Falls in the past year?: 1 (balance issue caused 2 falls, dog has tripped her up once) Number of falls in past year: 1 Was there an injury with Fall?: 0 Fall Risk Category Calculator: 2 Patient Fall Risk Level: Moderate Fall Risk  Fall Risk Patient at Risk for Falls Due to: Impaired balance/gait; Orthopedic patient Fall risk Follow up: Falls evaluation completed  Home and Transportation Safety: All rugs have non-skid backing?: N/A, no rugs All stairs or steps have railings?: yes (1 step going out back door, uses walker and holds on to door and rails) Grab bars in the bathtub or shower?: yes (has shower seat) Have non-skid surface in bathtub or shower?: yes Good home lighting?: yes Regular seat belt use?: yes Hospital stays in the last year:: no  Cognitive Assessment Difficulty concentrating, remembering, or making decisions? : yes Will 6CIT or Mini Cog be Completed: yes What year is it?: 0 points What month is it?: 0 points Give patient an address phrase to remember (5 components): [address], [place]  About what time is it?: 0 points Count backwards from 20 to 1: 0 points Say the months of the year in reverse: 0 points Repeat the address phrase from earlier: 0 points 6 CIT Score: 0 points  Advance Directives (For Healthcare) Does Patient Have a Medical Advance Directive?: Yes Does patient want to make changes to medical advance directive?: No -  Patient declined Type of Advance Directive: Healthcare Power of [place];  Living will Copy of Healthcare Power of Attorney in Chart?: Yes - validated most recent copy scanned in chart (See row information) Copy of Living Will in Chart?: Yes - validated most recent copy scanned in chart (See row information)  Reviewed/Updated  Reviewed/Updated: Reviewed All (Medical, Surgical, Family, Medications, Allergies, Care Teams, Patient Goals)    Allergies (verified) Codeine , Hydromorphone hcl, Ibuprofen, Morphine sulfate, and Metronidazole    Current Medications (verified) Outpatient Encounter Medications as of 05/19/2024  Medication Sig   acetaminophen  (TYLENOL ) 500 MG tablet Take 500-1,000 mg by mouth every 6 (six) hours as needed for moderate pain or headache.   atorvastatin  (LIPITOR ) 80 MG tablet TAKE 1 TABLET BY MOUTH EVERY DAY   Cholecalciferol (VITAMIN D3) 50 MCG (2000 UT) capsule Take 2,000 Units by mouth daily.   clotrimazole  (LOTRIMIN ) 1 % cream Apply 1 Application topically 2 (two) times daily.   FLUoxetine  (PROZAC ) 40 MG capsule Take 1 capsule (40 mg total) by mouth daily.   gabapentin  (NEURONTIN ) 300 MG capsule Take 2 capsules (600 mg total) by mouth every evening.   hydrochlorothiazide  (HYDRODIURIL ) 25 MG tablet Take 1 tablet (25 mg total) by mouth daily.   HYDROcodone -acetaminophen  (NORCO/VICODIN) 5-325 MG tablet Take 0.5 tablets by mouth every 6 (six) hours as needed for severe pain.   Interferon Beta-1b  (BETASERON /EXTAVIA ) 0.3 MG KIT injection Inject 0.25 mg into the skin every other day.   isosorbide  mononitrate (IMDUR ) 30 MG 24 hr tablet Take 1 tablet (30 mg total) by mouth daily. (Patient taking differently: Take 30 mg by mouth daily. Daily as needed)   levothyroxine  (SYNTHROID ) 88 MCG tablet Take 1 tablet (88 mcg total) by mouth daily.   losartan  (COZAAR ) 100 MG tablet TAKE 1 TABLET BY MOUTH EVERY DAY   mometasone  (NASONEX ) 50 MCG/ACT nasal spray Place 2 sprays into the nose daily as needed (allergies).   nitroGLYCERIN  (NITROSTAT ) 0.4 MG SL tablet  Place 1 tablet (0.4 mg total) under the tongue every 5 (five) minutes as needed for chest pain.   Polyethyl Glycol-Propyl Glycol (SYSTANE ULTRA OP) Place 1 drop into the right eye daily as needed (dry eye).    senna (SENOKOT) 8.6 MG tablet Take 2 tablets by mouth at bedtime.   Vitamin D , Ergocalciferol , (DRISDOL ) 1.25 MG (50000 UNIT) CAPS capsule Take 1 capsule (50,000 Units total) by mouth every 7 (seven) days.   [DISCONTINUED] UNABLE TO FIND Med Name: Triamcinolone cream   No facility-administered encounter medications on file as of 05/19/2024.    History: Past Medical History:  Diagnosis Date   AAA (abdominal aortic aneurysm)    Allergic rhinitis    Anxiety    Arthritis    back   Chronic hoarseness    Colon polyps    COPD (chronic obstructive pulmonary disease) (HCC)    Dr. Geronimo   Coronary artery disease    nonobstructive   Dyslipidemia    Gastric bleed 10/2021   stomach ulcers   Gastritis    GERD (gastroesophageal reflux disease)    History of hiatal hernia    Hyperlipidemia    Hypertension    Hypothyroidism    MS (multiple sclerosis)    Neuromuscular disorder (HCC)    MS   Obesity    RBBB    Stomach ulcer from aspirin /ibuprofen-like drugs (NSAID's)    Past Surgical History:  Procedure Laterality Date   ABDOMINAL AORTIC ENDOVASCULAR STENT GRAFT N/A 10/31/2022   Procedure: ABDOMINAL AORTIC ENDOVASCULAR STENT  GRAFT;  Surgeon: Sheree Penne Bruckner, MD;  Location: [facility] OR;  Service: Vascular;  Laterality: N/A;   ABDOMINAL HYSTERECTOMY     Endometriosis   BARIATRIC SURGERY  09/07/2008   lap band   CARPAL TUNNEL RELEASE Bilateral    CATARACT EXTRACTION Right 11/08/2017   CATARACT EXTRACTION Left 11/22/2017   CATARACT EXTRACTION, BILATERAL      Toric lenses for astigmatism   CHOLECYSTECTOMY     ESOPHAGOGASTRODUODENOSCOPY (EGD) WITH PROPOFOL  N/A 10/17/2021   Procedure: ESOPHAGOGASTRODUODENOSCOPY (EGD) WITH PROPOFOL ;  Surgeon: Burnette Fallow, MD;  Location:  [facility] ENDOSCOPY;  Service: Gastroenterology;  Laterality: N/A;   HIATAL HERNIA REPAIR  09/07/2008   LEFT HEART CATH AND CORONARY ANGIOGRAPHY N/A 07/09/2019   Procedure: LEFT HEART CATH AND CORONARY ANGIOGRAPHY;  Surgeon: Claudene Victory ORN, MD;  Location: [facility] INVASIVE CV LAB;  Service: Cardiovascular;  Laterality: N/A;   TONSILLECTOMY     ULTRASOUND GUIDANCE FOR VASCULAR ACCESS Bilateral 10/31/2022   Procedure: ULTRASOUND GUIDANCE FOR VASCULAR ACCESS;  Surgeon: Sheree Penne Bruckner, MD;  Location: [facility] OR;  Service: Vascular;  Laterality: Bilateral;   VULVECTOMY PARTIAL N/A 02/27/2023   Procedure: VULVECTOMY PARTIAL;  Surgeon: Eldonna Mays, MD;  Location: [facility] ORS;  Service: Gynecology;  Laterality: N/A;   Family History  Problem Relation Age of Onset   Stroke Mother 28   Heart disease Mother        congenital heart defect?   COPD Father    Heart disease Father    AAA (abdominal aortic aneurysm) Father    COPD Sister    Arthritis Sister    AAA (abdominal aortic aneurysm) Sister    Dementia Sister    Other Sister        brain injury   Multiple sclerosis Neg Hx    Breast cancer Neg Hx    Ovarian cancer Neg Hx    Endometrial cancer Neg Hx    Colon cancer Neg Hx    Social History   Occupational History   Occupation: retired  Tobacco Use   Smoking status: Former    Current packs/day: 0.00    Average packs/day: 2.0 packs/day for 40.0 years (80.0 ttl pk-yrs)    Types: Cigarettes    Start date: 04/05/1967    Quit date: 04/05/2007    Years since quitting: 17.1    Passive exposure: Current (Husband)   Smokeless tobacco: Never  Vaping Use   Vaping status: Not on file  Substance and Sexual Activity   Alcohol  use: No   Drug use: No   Sexual activity: Not Currently    Partners: Male   Tobacco Counseling Counseling given: Not Answered  SDOH Screenings   Food Insecurity: No Food Insecurity (05/19/2024)  Housing: Low Risk (05/19/2024)  Transportation Needs: No Transportation  Needs (05/19/2024)  Utilities: Not At Risk (05/19/2024)  Depression (PHQ2-9): Medium Risk (05/19/2024)  Physical Activity: Inactive (05/19/2024)  Social Connections: Socially Isolated (05/19/2024)  Stress: Stress Concern Present (05/19/2024)  Tobacco Use: Medium Risk (05/19/2024)   See flowsheets for full screening details  Depression Screen PHQ 2 & 9 Depression Scale- Over the past 2 weeks, how often have you been bothered by any of the following problems? Little interest or pleasure in doing things: 0 Feeling down, depressed, or hopeless (PHQ Adolescent also includes...irritable): 1 (This fluctuates) PHQ-2 Total Score: 1 Trouble falling or staying asleep, or sleeping too much: 3 Feeling tired or having little energy: 0 Poor appetite or overeating (PHQ Adolescent also includes...weight loss): 0 Feeling bad about yourself -  or that you are a failure or have let yourself or your family down: 1 Trouble concentrating on things, such as reading the newspaper or watching television (PHQ Adolescent also includes...like school work): 0 Moving or speaking so slowly that other people could have noticed. Or the opposite - being so fidgety or restless that you have been moving around a lot more than usual: 0 Thoughts that you would be better off dead, or of hurting yourself in some way: 0 PHQ-9 Total Score: 5 If you checked off any problems, how difficult have these problems made it for you to do your work, take care of things at home, or get along with other people?: Somewhat difficult     Goals Addressed             This Visit's Progress    [facility]               Objective:    Today's Vitals   05/19/24 0941  Weight: 197 lb (89.4 kg)  Height: 5' 6 (1.676 m)   Body mass index is 31.8 kg/m.  Hearing/Vision screen No results found. Immunizations and Health Maintenance Health Maintenance  Topic Date Due   DTaP/Tdap/Td (2 - Td or Tdap)  03/06/2021   Mammogram  07/14/2023   Zoster Vaccines- Shingrix (1 of 2) 05/19/2025 (Originally 02/02/1970)   COVID-19 Vaccine (6 - Pfizer risk 2025-26 season) 11/10/2024   Medicare Annual Wellness (AWV)  05/19/2025   Colonoscopy  07/26/2025   Pneumococcal Vaccine: 50+ Years  Completed   Influenza Vaccine  Completed   Bone Density Scan  Completed   Hepatitis C Screening  Completed   Meningococcal B Vaccine  Aged Out   Lung Cancer Screening  Discontinued        Assessment/Plan:  This is a routine wellness examination for Ronal Moellers.  Patient Care Team: Antonio Meth, Jamee SAUNDERS, DO as PCP - General Patrcia Sharper, MD as Consulting Physician (Ophthalmology) Sherryl Bouchard, NP as Registered Nurse (Neurology) Bonner Ade, MD as Consulting Physician (Physical Medicine and Rehabilitation) Kay Kemps, MD as Consulting Physician (Orthopedic Surgery) Sheree Penne Bruckner, MD as Consulting Physician (Vascular Surgery) Jeffrie Oneil BROCKS, MD as Consulting Physician (Cardiology) Jarold Mayo, MD as Consulting Physician (Ophthalmology)  I have personally reviewed and noted the following in the patients chart:   Medical and social history Use of alcohol , tobacco or illicit drugs  Current medications and supplements including opioid prescriptions. Functional ability and status Nutritional status Physical activity Advanced directives List of other physicians Hospitalizations, surgeries, and ER visits in previous 12 months Vitals Screenings to include cognitive, depression, and falls Referrals and appointments  Orders Placed This Encounter  Procedures   MM 3D SCREENING MAMMOGRAM BILATERAL BREAST    Standing Status:   Future    Expected Date:   07/14/2024    Expiration Date:   05/19/2025    Reason for Exam (SYMPTOM  OR DIAGNOSIS REQUIRED):   breast cancer screening    Preferred imaging location?:   [facility]   DG Bone Density    Standing Status:   Future    Expected Date:    05/19/2024    Expiration Date:   05/19/2025    Reason for Exam (SYMPTOM  OR DIAGNOSIS REQUIRED):   osteoporosis    Preferred imaging location?:   [address]   In addition, I have reviewed and discussed with patient certain preventive protocols, quality metrics, and best practice recommendations. A  written personalized care plan for preventive services as well as general preventive health recommendations were provided to patient.   Lolita Libra, CMA   05/19/2024   Return in 1 year (on 05/19/2025).  After Visit Summary: (MyChart) Due to this being a telephonic visit, the after visit summary with patients personalized plan was offered to patient via MyChart   Nurse Notes: see phone note

## 2024-05-19 NOTE — Patient Instructions (Signed)
 Cassandra Rios,  Thank you for taking the time for your Medicare Wellness Visit. I appreciate your continued commitment to your health goals. Please review the care plan we discussed, and feel free to reach out if I can assist you further.  Please note that Annual Wellness Visits do not include a physical exam. Some assessments may be limited, especially if the visit was conducted virtually. If needed, we may recommend an in-person follow-up with your provider.  GOAL: [facility]  Ongoing Care Seeing your primary care provider every 3 to 6 months helps us  monitor your health and provide consistent, personalized care.   Dr Antonio Meth: 05/23/24 10am Medicare AWV: 05/21/25 11am, telephone  Referrals If a referral was made during today's visit and you haven't received any updates within two weeks, please contact the referred provider directly to check on the status.  Mammogram ([facility]):  [phone] Bone Density ([address] [place], [place]. Basement level): [phone]  Recommended Screenings:  Health Maintenance  Topic Date Due   DTaP/Tdap/Td vaccine (2 - Td or Tdap) 03/06/2021   Breast Cancer Screening  07/14/2023   Zoster (Shingles) Vaccine (1 of 2) 05/19/2025*   COVID-19 Vaccine (6 - Pfizer risk 2025-26 season) 11/10/2024   Medicare Annual Wellness Visit  05/19/2025   Colon Cancer Screening  07/26/2025   Pneumococcal Vaccine for age over 3  Completed   Flu Shot  Completed   Osteoporosis screening with Bone Density Scan  Completed   Hepatitis C Screening  Completed   Meningitis B Vaccine  Aged Out   Screening for Lung Cancer  Discontinued  *Topic was postponed. The date shown is not the original due date.       05/19/2024    9:51 AM  Advanced Directives  Does Patient Have a Medical Advance Directive? Yes  Type of Estate Agent of [place];Living will  Does patient want to make changes to medical advance directive?  No - Patient declined  Copy of Healthcare Power of Attorney in Chart? Yes - validated most recent copy scanned in chart (See row information)  I am mailing an Advanced Directive Packet to you today.  If you have not received it by the time you come in on 05/23/24 you can ask for a set while you are here. You may also update them and have them notarized while you are here if you would like. Once completed and notarized, you may return a copy of your Advanced Directive(s) by either of the following:  Bring a copy of your health care power of attorney and living will to the office to be added to your chart at your convenience. You can mail a copy to [facility] [address] [place], [place] [address] or email to [email or web address]   Vision: Annual vision screenings are recommended for early detection of glaucoma, cataracts, and diabetic retinopathy. These exams can also reveal signs of chronic conditions such as diabetes and high blood pressure.  Dental: Annual dental screenings help detect early signs of oral cancer, gum disease, and other conditions linked to overall health, including heart disease and diabetes.  Please see the attached documents for additional preventive care recommendations.

## 2024-05-19 NOTE — Telephone Encounter (Signed)
 PT had AWV today. FYI:  I am mailing a new set of directives to her today as updates are needed. She would like to fill out a set while she is here on 05/23/24 if she doesn't get them in the  mail before that appt. She reports a total of 3 falls this year. 2 were due to balance issues and one was tripping over the dog. She may like to consider PT for gait/balance but wants to wait until after the first of the year. Pt is going to be receiving a series of radiation treatments for the arthritis in her back. She is wanting to complete this first before considering PT for her balance / gait. She is also holding off doing mammogram and DEXA until these are completed.

## 2024-05-20 ENCOUNTER — Ambulatory Visit
Admission: RE | Admit: 2024-05-20 | Discharge: 2024-05-20 | Disposition: A | Discharge status: Home / Self Care | Source: Ambulatory Visit | Attending: Radiation Oncology | Admitting: Radiation Oncology

## 2024-05-23 ENCOUNTER — Ambulatory Visit: Admitting: Family Medicine

## 2024-05-23 ENCOUNTER — Encounter: Payer: Self-pay | Admitting: Family Medicine

## 2024-05-23 VITALS — BP 136/80 | HR 65 | Temp 97.7°F | Resp 18 | Ht 66.0 in | Wt 189.0 lb

## 2024-05-23 DIAGNOSIS — Z0000 Encounter for general adult medical examination without abnormal findings: Secondary | ICD-10-CM | POA: Diagnosis not present

## 2024-05-23 DIAGNOSIS — F419 Anxiety disorder, unspecified: Secondary | ICD-10-CM

## 2024-05-23 DIAGNOSIS — E039 Hypothyroidism, unspecified: Secondary | ICD-10-CM | POA: Diagnosis not present

## 2024-05-23 DIAGNOSIS — E2839 Other primary ovarian failure: Secondary | ICD-10-CM

## 2024-05-23 DIAGNOSIS — M810 Age-related osteoporosis without current pathological fracture: Secondary | ICD-10-CM | POA: Diagnosis not present

## 2024-05-23 DIAGNOSIS — E785 Hyperlipidemia, unspecified: Secondary | ICD-10-CM | POA: Diagnosis not present

## 2024-05-23 DIAGNOSIS — I10 Essential (primary) hypertension: Secondary | ICD-10-CM

## 2024-05-23 DIAGNOSIS — E559 Vitamin D deficiency, unspecified: Secondary | ICD-10-CM | POA: Diagnosis not present

## 2024-05-23 LAB — LIPID PANEL
Cholesterol: 125 mg/dL (ref 28–200)
HDL: 49.6 mg/dL
LDL Cholesterol: 58 mg/dL (ref 10–99)
NonHDL: 75.8
Total CHOL/HDL Ratio: 3
Triglycerides: 89 mg/dL (ref 10.0–149.0)
VLDL: 17.8 mg/dL (ref 0.0–40.0)

## 2024-05-23 LAB — COMPREHENSIVE METABOLIC PANEL WITH GFR
ALT: 22 U/L (ref 3–35)
AST: 39 U/L — ABNORMAL HIGH (ref 5–37)
Albumin: 4 g/dL (ref 3.5–5.2)
Alkaline Phosphatase: 88 U/L (ref 39–117)
BUN: 13 mg/dL (ref 6–23)
CO2: 29 meq/L (ref 19–32)
Calcium: 9.5 mg/dL (ref 8.4–10.5)
Chloride: 96 meq/L (ref 96–112)
Creatinine, Ser: 1.21 mg/dL — ABNORMAL HIGH (ref 0.40–1.20)
GFR: 44.22 mL/min — ABNORMAL LOW
Glucose, Bld: 103 mg/dL — ABNORMAL HIGH (ref 70–99)
Potassium: 4 meq/L (ref 3.5–5.1)
Sodium: 134 meq/L — ABNORMAL LOW (ref 135–145)
Total Bilirubin: 0.7 mg/dL (ref 0.2–1.2)
Total Protein: 6.8 g/dL (ref 6.0–8.3)

## 2024-05-23 LAB — CBC WITH DIFFERENTIAL/PLATELET
Basophils Absolute: 0 K/uL (ref 0.0–0.1)
Basophils Relative: 0.4 % (ref 0.0–3.0)
Eosinophils Absolute: 0.1 K/uL (ref 0.0–0.7)
Eosinophils Relative: 1.4 % (ref 0.0–5.0)
HCT: 38.5 % (ref 36.0–46.0)
Hemoglobin: 13 g/dL (ref 12.0–15.0)
Lymphocytes Relative: 18.4 % (ref 12.0–46.0)
Lymphs Abs: 1.1 K/uL (ref 0.7–4.0)
MCHC: 33.7 g/dL (ref 30.0–36.0)
MCV: 88.3 fl (ref 78.0–100.0)
Monocytes Absolute: 0.4 K/uL (ref 0.1–1.0)
Monocytes Relative: 7.1 % (ref 3.0–12.0)
Neutro Abs: 4.3 K/uL (ref 1.4–7.7)
Neutrophils Relative %: 72.7 % (ref 43.0–77.0)
Platelets: 133 K/uL — ABNORMAL LOW (ref 150.0–400.0)
RBC: 4.37 Mil/uL (ref 3.87–5.11)
RDW: 13.3 % (ref 11.5–15.5)
WBC: 5.9 K/uL (ref 4.0–10.5)

## 2024-05-23 LAB — TSH: TSH: 6.74 u[IU]/mL — ABNORMAL HIGH (ref 0.35–5.50)

## 2024-05-23 LAB — VITAMIN D 25 HYDROXY (VIT D DEFICIENCY, FRACTURES): VITD: 77.28 ng/mL (ref 30.00–100.00)

## 2024-05-23 NOTE — Assessment & Plan Note (Signed)
 Well controlled, no changes to meds. Encouraged heart healthy diet such as the DASH diet and exercise as tolerated.

## 2024-05-23 NOTE — Assessment & Plan Note (Signed)
 Encourage heart healthy diet such as MIND or DASH diet, increase exercise, avoid trans fats, simple carbohydrates and processed foods, consider a krill or fish or flaxseed oil cap daily.

## 2024-05-23 NOTE — Progress Notes (Signed)
 "  Subjective:    Patient ID: Cassandra Rios Cassandra Rios, female    DOB: 1951-02-11, 73 y.o.   MRN: [id number]  Chief Complaint  Patient presents with   Annual Exam    Pt states fasting     HPI Patient is in today for cpe.  Discussed the use of AI scribe software for clinical note transcription with the patient, who gave verbal consent to proceed.  History of Present Illness Cassandra Rios is a 73 year old female who presents for an annual physical exam.  She experiences indigestion from her current Prozac  regimen, which is 40 mg daily. This is concerning due to her history of stomach ulcers. She is hesitant to change her medication regimen significantly but is considering adjusting the dosage.  She has a history of quivering in her chest for which she was prescribed nitroglycerin . However, she experienced severe headaches as a side effect and discontinued its use. She now manages her symptoms with breathing techniques.  She is considering low-dose radiation therapy for arthritis and bursitis in her lower back. She has been in contact with Dr. Phillip at the cancer center for this treatment. She learned about this option through her pain doctor, Dr. Bonner, and is planning to start after her insurance changes in January.  She has been taking a weekly green vitamin D  pill but is now taking two 5000 IU vitamin D  gummies daily. She is also due for a mammogram and bone density test but has postponed these until after her radiation treatment.  She reports pain in her right ear, which she describes as not feeling like fluid. She uses over-the-counter ear drops for pain relief. No current ear pain but reports a sensation of being underwater. She uses Flonase  and has antihistamines available.  She mentions a recent weight loss, attributing it to feeling full quickly and living alone. She tries to maintain a diet with adequate protein and green vegetables. She lives alone and is currently without her car,  relying on others for transportation.    Past Medical History:  Diagnosis Date   AAA (abdominal aortic aneurysm)    Allergic rhinitis    Anxiety    Arthritis    back   Chronic hoarseness    Colon polyps    COPD (chronic obstructive pulmonary disease) (HCC)    Dr. Geronimo   Coronary artery disease    nonobstructive   Dyslipidemia    Gastric bleed 10/2021   stomach ulcers   Gastritis    GERD (gastroesophageal reflux disease)    History of hiatal hernia    Hyperlipidemia    Hypertension    Hypothyroidism    MS (multiple sclerosis)    Neuromuscular disorder (HCC)    MS   Obesity    RBBB    Stomach ulcer from aspirin /ibuprofen-like drugs (NSAID's)     Past Surgical History:  Procedure Laterality Date   ABDOMINAL AORTIC ENDOVASCULAR STENT GRAFT N/A 10/31/2022   Procedure: ABDOMINAL AORTIC ENDOVASCULAR STENT GRAFT;  Surgeon: Sheree Penne Bruckner, MD;  Location: [facility] OR;  Service: Vascular;  Laterality: N/A;   ABDOMINAL HYSTERECTOMY     Endometriosis   BARIATRIC SURGERY  09/07/2008   lap band   CARPAL TUNNEL RELEASE Bilateral    CATARACT EXTRACTION Right 11/08/2017   CATARACT EXTRACTION Left 11/22/2017   CATARACT EXTRACTION, BILATERAL      Toric lenses for astigmatism   CHOLECYSTECTOMY     ESOPHAGOGASTRODUODENOSCOPY (EGD) WITH PROPOFOL  N/A 10/17/2021   Procedure:  ESOPHAGOGASTRODUODENOSCOPY (EGD) WITH PROPOFOL ;  Surgeon: Burnette Fallow, MD;  Location: [facility] ENDOSCOPY;  Service: Gastroenterology;  Laterality: N/A;   HIATAL HERNIA REPAIR  09/07/2008   LEFT HEART CATH AND CORONARY ANGIOGRAPHY N/A 07/09/2019   Procedure: LEFT HEART CATH AND CORONARY ANGIOGRAPHY;  Surgeon: Claudene Victory ORN, MD;  Location: [facility] INVASIVE CV LAB;  Service: Cardiovascular;  Laterality: N/A;   TONSILLECTOMY     ULTRASOUND GUIDANCE FOR VASCULAR ACCESS Bilateral 10/31/2022   Procedure: ULTRASOUND GUIDANCE FOR VASCULAR ACCESS;  Surgeon: Sheree Penne Bruckner, MD;  Location: [facility] OR;  Service:  Vascular;  Laterality: Bilateral;   VULVECTOMY PARTIAL N/A 02/27/2023   Procedure: VULVECTOMY PARTIAL;  Surgeon: Eldonna Mays, MD;  Location: [facility] ORS;  Service: Gynecology;  Laterality: N/A;    Family History  Problem Relation Age of Onset   Stroke Mother 65   Heart disease Mother        congenital heart defect?   COPD Father    Heart disease Father    AAA (abdominal aortic aneurysm) Father    COPD Sister    Arthritis Sister    AAA (abdominal aortic aneurysm) Sister    Dementia Sister    Other Sister        brain injury   Multiple sclerosis Neg Hx    Breast cancer Neg Hx    Ovarian cancer Neg Hx    Endometrial cancer Neg Hx    Colon cancer Neg Hx     Social History   Socioeconomic History   Marital status: Widowed    Spouse name: Ozell   Number of children: 2   Years of education: 16   Highest education level: Not on file  Occupational History   Occupation: retired  Tobacco Use   Smoking status: Former    Current packs/day: 0.00    Average packs/day: 2.0 packs/day for 40.0 years (80.0 ttl pk-yrs)    Types: Cigarettes    Start date: 04/05/1967    Quit date: 04/05/2007    Years since quitting: 17.1    Passive exposure: Current (Husband)   Smokeless tobacco: Never  Vaping Use   Vaping status: Not on file  Substance and Sexual Activity   Alcohol  use: No   Drug use: No   Sexual activity: Not Currently    Partners: Male  Other Topics Concern   Not on file  Social History Narrative   Patient is right handed,resides alone (son is in and out)   Right handed   Social Drivers of Health   Tobacco Use: Medium Risk (05/23/2024)   Patient History    Smoking Tobacco Use: Former    Smokeless Tobacco Use: Never    Passive Exposure: Current  Physicist, Medical Strain: Not on file  Food Insecurity: No Food Insecurity (05/19/2024)   Epic    Worried About Programme Researcher, Broadcasting/film/video in the Last Year: Never true    Ran Out of Food in the Last Year: Never true   Transportation Needs: No Transportation Needs (05/19/2024)   Epic    Lack of Transportation (Medical): No    Lack of Transportation (Non-Medical): No  Physical Activity: Inactive (05/19/2024)   Exercise Vital Sign    Days of Exercise per Week: 0 days    Minutes of Exercise per Session: 0 min  Stress: Stress Concern Present (05/19/2024)   [employer] of Occupational Health - Occupational Stress Questionnaire    Feeling of Stress: To some extent  Social Connections: Socially Isolated (05/19/2024)   Social Connection and  Isolation Panel    Frequency of Communication with Friends and Family: Three times a week    Frequency of Social Gatherings with Friends and Family: Twice a week    Attends Religious Services: Never    Database Administrator or Organizations: No    Attends Banker Meetings: Never    Marital Status: Widowed  Intimate Partner Violence: Not At Risk (05/19/2024)   Epic    Fear of Current or Ex-Partner: No    Emotionally Abused: No    Physically Abused: No    Sexually Abused: No  Depression (PHQ2-9): Medium Risk (05/19/2024)   Depression (PHQ2-9)    PHQ-2 Score: 5  Alcohol  Screen: Not on file  Housing: Low Risk (05/19/2024)   Epic    Unable to Pay for Housing in the Last Year: No    Number of Times Moved in the Last Year: 0    Homeless in the Last Year: No  Utilities: Not At Risk (05/19/2024)   Epic    Threatened with loss of utilities: No  Health Literacy: Not on file    Outpatient Medications Prior to Visit  Medication Sig Dispense Refill   acetaminophen  (TYLENOL ) 500 MG tablet Take 500-1,000 mg by mouth every 6 (six) hours as needed for moderate pain or headache.     atorvastatin  (LIPITOR ) 80 MG tablet TAKE 1 TABLET BY MOUTH EVERY DAY 90 tablet 1   Cholecalciferol (VITAMIN D3) 50 MCG (2000 UT) capsule Take 2,000 Units by mouth daily.     clotrimazole  (LOTRIMIN ) 1 % cream Apply 1 Application topically 2 (two) times daily. 30 g 3    gabapentin  (NEURONTIN ) 300 MG capsule Take 2 capsules (600 mg total) by mouth every evening. 180 capsule 3   hydrochlorothiazide  (HYDRODIURIL ) 25 MG tablet Take 1 tablet (25 mg total) by mouth daily. 90 tablet 1   HYDROcodone -acetaminophen  (NORCO/VICODIN) 5-325 MG tablet Take 0.5 tablets by mouth every 6 (six) hours as needed for severe pain.     Interferon Beta-1b  (BETASERON /EXTAVIA ) 0.3 MG KIT injection Inject 0.25 mg into the skin every other day. 42 each 3   isosorbide  mononitrate (IMDUR ) 30 MG 24 hr tablet Take 1 tablet (30 mg total) by mouth daily. (Patient taking differently: Take 30 mg by mouth daily. Daily as needed) 90 tablet 3   levothyroxine  (SYNTHROID ) 88 MCG tablet Take 1 tablet (88 mcg total) by mouth daily. 90 tablet 3   mometasone  (NASONEX ) 50 MCG/ACT nasal spray Place 2 sprays into the nose daily as needed (allergies).     nitroGLYCERIN  (NITROSTAT ) 0.4 MG SL tablet Place 1 tablet (0.4 mg total) under the tongue every 5 (five) minutes as needed for chest pain. 25 tablet 4   Polyethyl Glycol-Propyl Glycol (SYSTANE ULTRA OP) Place 1 drop into the right eye daily as needed (dry eye).      senna (SENOKOT) 8.6 MG tablet Take 2 tablets by mouth at bedtime.     FLUoxetine  (PROZAC ) 40 MG capsule Take 1 capsule (40 mg total) by mouth daily. 90 capsule 1   losartan  (COZAAR ) 100 MG tablet TAKE 1 TABLET BY MOUTH EVERY DAY 90 tablet 1   Vitamin D , Ergocalciferol , (DRISDOL ) 1.25 MG (50000 UNIT) CAPS capsule Take 1 capsule (50,000 Units total) by mouth every 7 (seven) days. 12 capsule 1   No facility-administered medications prior to visit.    Allergies  Allergen Reactions   Codeine  Itching and Nausea And Vomiting   Hydromorphone Hcl Nausea And Vomiting   Ibuprofen  Other (See Comments)    stomach ulcers   Morphine Sulfate     makes my blood pressure bottom out   Metronidazole  Hives, Nausea And Vomiting and Rash    Review of Systems  Constitutional:  Negative for fever and  malaise/fatigue.  HENT:  Negative for congestion.   Eyes:  Negative for blurred vision.  Respiratory:  Negative for shortness of breath.   Cardiovascular:  Negative for chest pain, palpitations and leg swelling.  Gastrointestinal:  Negative for abdominal pain, blood in stool and nausea.  Genitourinary:  Negative for dysuria and frequency.  Musculoskeletal:  Negative for falls.  Skin:  Negative for rash.  Neurological:  Negative for dizziness, loss of consciousness and headaches.  Endo/Heme/Allergies:  Negative for environmental allergies.  Psychiatric/Behavioral:  Negative for depression. The patient is not nervous/anxious.        Objective:    Physical Exam Vitals and nursing note reviewed.  Constitutional:      General: She is not in acute distress.    Appearance: Normal appearance. She is well-developed.  HENT:     Head: Normocephalic and atraumatic.  Eyes:     General: No scleral icterus.       Right eye: No discharge.        Left eye: No discharge.  Cardiovascular:     Rate and Rhythm: Normal rate and regular rhythm.     Heart sounds: No murmur heard. Pulmonary:     Effort: Pulmonary effort is normal. No respiratory distress.     Breath sounds: Normal breath sounds.  Musculoskeletal:        General: Normal range of motion.     Cervical back: Normal range of motion and neck supple.     Right lower leg: No edema.     Left lower leg: No edema.  Skin:    General: Skin is warm and dry.  Neurological:     Mental Status: She is alert and oriented to person, place, and time.  Psychiatric:        Mood and Affect: Mood normal.        Behavior: Behavior normal.        Thought Content: Thought content normal.        Judgment: Judgment normal.     BP 136/80 (BP Location: Left Arm, Patient Position: Sitting)   Pulse 65   Temp 97.7 F (36.5 C) (Oral)   Resp 18   Ht 5' 6 (1.676 m)   Wt 189 lb (85.7 kg)   SpO2 100%   BMI 30.51 kg/m  Wt Readings from Last 3  Encounters:  05/23/24 189 lb (85.7 kg)  05/19/24 197 lb (89.4 kg)  04/23/24 189 lb 6 oz (85.9 kg)    Diabetic Foot Exam - Simple   No data filed    Lab Results  Component Value Date   WBC 5.9 05/23/2024   HGB 13.0 05/23/2024   HCT 38.5 05/23/2024   PLT 133.0 (L) 05/23/2024   GLUCOSE 103 (H) 05/23/2024   CHOL 125 05/23/2024   TRIG 89.0 05/23/2024   HDL 49.60 05/23/2024   LDLCALC 58 05/23/2024   ALT 22 05/23/2024   AST 39 (H) 05/23/2024   NA 134 (L) 05/23/2024   K 4.0 05/23/2024   CL 96 05/23/2024   CREATININE 1.21 (H) 05/23/2024   BUN 13 05/23/2024   CO2 29 05/23/2024   TSH 6.74 (H) 05/23/2024   INR 1.2 10/31/2022   HGBA1C 5.9 09/25/2016    Lab  Results  Component Value Date   TSH 6.74 (H) 05/23/2024   Lab Results  Component Value Date   WBC 5.9 05/23/2024   HGB 13.0 05/23/2024   HCT 38.5 05/23/2024   MCV 88.3 05/23/2024   PLT 133.0 (L) 05/23/2024   Lab Results  Component Value Date   NA 134 (L) 05/23/2024   K 4.0 05/23/2024   CO2 29 05/23/2024   GLUCOSE 103 (H) 05/23/2024   BUN 13 05/23/2024   CREATININE 1.21 (H) 05/23/2024   BILITOT 0.7 05/23/2024   ALKPHOS 88 05/23/2024   AST 39 (H) 05/23/2024   ALT 22 05/23/2024   PROT 6.8 05/23/2024   ALBUMIN 4.0 05/23/2024   CALCIUM  9.5 05/23/2024   ANIONGAP 9 02/19/2023   EGFR 48 (L) 02/06/2024   GFR 44.22 (L) 05/23/2024   Lab Results  Component Value Date   CHOL 125 05/23/2024   Lab Results  Component Value Date   HDL 49.60 05/23/2024   Lab Results  Component Value Date   LDLCALC 58 05/23/2024   Lab Results  Component Value Date   TRIG 89.0 05/23/2024   Lab Results  Component Value Date   CHOLHDL 3 05/23/2024   Lab Results  Component Value Date   HGBA1C 5.9 09/25/2016       Assessment & Plan:  Preventative health care  Osteoporosis, unspecified osteoporosis type, unspecified pathological fracture presence  Anxiety  Primary hypertension -     CBC with Differential/Platelet -      Comprehensive metabolic panel with GFR -     Lipid panel -     TSH  Hypothyroidism, unspecified type -     TSH  Vitamin D  deficiency -     VITAMIN D  25 Hydroxy (Vit-D Deficiency, Fractures)  Hyperlipidemia LDL goal <70 -     Comprehensive metabolic panel with GFR -     Lipid panel  Estrogen deficiency -     DG Bone Density; Future  Hyperlipidemia, unspecified hyperlipidemia type Assessment & Plan: Encourage heart healthy diet such as MIND or DASH diet, increase exercise, avoid trans fats, simple carbohydrates and processed foods, consider a krill or fish or flaxseed oil cap daily.     Essential hypertension Assessment & Plan: Well controlled, no changes to meds. Encouraged heart healthy diet such as the DASH diet and exercise as tolerated.      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 Low-dose radiation therapy is being considered for pain management, expected to last about two years. Treatment will proceed once new insurance coverage is confirmed.  Osteoporosis   Management has been discussed. She is due for a bone density test but prefers to wait until after radiation therapy to avoid potential interference. A bone density test has been ordered at a convenient location, such as [facility].  Vitamin D  deficiency   This is managed with over-the-counter vitamin D  gummies. The current dosage of 5000 IU daily is considered adequate. She should continue with the current vitamin D  supplementation.  Anxiety disorder   This is managed with Prozac . She reports indigestion as a side effect but prefers to continue the current dosage due to its effectiveness.  General Health Maintenance   She has received the COVID booster and flu shot. A tetanus booster is due and should be obtained at a pharmacy. The mammogram was canceled due to transportation issues  and  should be rescheduled at the breast center. A bone density test is pending due to radiation therapy and has been ordered at a convenient location.   Cathrine Krizan R Lowne Chase, DO  "

## 2024-05-26 ENCOUNTER — Other Ambulatory Visit: Payer: Self-pay | Admitting: Family Medicine

## 2024-05-26 ENCOUNTER — Ambulatory Visit: Payer: Self-pay | Admitting: Family Medicine

## 2024-05-26 DIAGNOSIS — E039 Hypothyroidism, unspecified: Secondary | ICD-10-CM

## 2024-05-27 MED ORDER — LEVOTHYROXINE SODIUM 100 MCG PO TABS: 100.0000 ug | ORAL_TABLET | Freq: Every day | ORAL | 0 refills | Status: DC

## 2024-06-04 NOTE — Progress Notes (Signed)
 "  Subjective:   Chief Complaint  Patient presents with   Cough    Productive with green mucus. Cough started over the weekend. No known fever. Tylenol  taken last taken yesterday.    Ear Fullness    Left side fullness      History of Present Illness The patient presents for evaluation of a cough.  She began experiencing a cough at the end of the previous week accompanied by sore throat and rhinorrhea with nasal congestion, now escalated to green sputum production. She reports no fever but has rhinorrhea and pharyngitis. Over the weekend, her voice was significantly hoarse, nearly unable to speak. Symptoms improved with Nasonex , but green sputum onset followed. She has been managing symptoms with Tylenol  and Nasonex  as needed.   Parts of patient history reviewed include PMH, problem list, medications, allergies, and social history.  Objective:   Vitals:   06/04/24 1037 06/04/24 1051  BP: (!) 107/95 146/63  BP Location: Left arm Left arm  Patient Position: Sitting Sitting  Pulse: 54   Resp: 20   Temp: 97.4 F (36.3 C)   TempSrc: Oral   SpO2: 96%   Weight: 88 kg (194 lb)     Physical Exam Vitals reviewed.  Constitutional:      Appearance: Normal appearance.  HENT:     Head: Normocephalic and atraumatic.     Right Ear: Tympanic membrane, ear canal and external ear normal.     Left Ear: Tympanic membrane, ear canal and external ear normal.     Nose: Congestion and rhinorrhea present.     Mouth/Throat:     Mouth: Mucous membranes are moist.     Pharynx: No oropharyngeal exudate or posterior oropharyngeal erythema.  Eyes:     Extraocular Movements: Extraocular movements intact.  Cardiovascular:     Rate and Rhythm: Normal rate and regular rhythm.     Pulses: Normal pulses.     Heart sounds: Normal heart sounds.  Pulmonary:     Effort: Pulmonary effort is normal.     Breath sounds: Normal breath sounds.  Musculoskeletal:        General: Normal range of motion.      Cervical back: Normal range of motion and neck supple.  Lymphadenopathy:     Cervical: No cervical adenopathy.  Skin:    General: Skin is warm and dry.  Neurological:     General: No focal deficit present.     Mental Status: She is alert and oriented to person, place, and time.            Assessment/Plan:   Cassandra Rios was seen today for cough and ear fullness.  Diagnoses and all orders for this visit:  Bacterial URI  Acute non-recurrent sinusitis, unspecified location  Other orders -     amoxicillin -pot clavulanate (AUGMENTIN ) 875-125 mg per tablet; Take 1 tablet by mouth 2 (two) times a day for 7 days.     Assessment & Plan Cassandra Rios is a 73 y.o. female is here for worsening sinus pressure accompanied by cough productive of green mucus in the setting of a URI for about 1 week.  Afebrile and well appearing. Lungs CTAB. O2 sat 96% on RA indicating adequate oxygenation. No evidence to suggest pneumonia nor bronchitis. Exam sinus TTP and congestion. Overall clinical picture most c/w acute bacterial sinusitis secondary to initial viral URI. Will treat with augmentin . Recommend supportive treatments with nasal saline PRN nasal congestion, continue nasonex , use mucinex  DM PRN cough  and congestion, tylenol /motrin for aches/pain, aggressive hydration and rest.   Patient will return for new or worsening symptoms. I discussed the findings today, diagnosis/differential diagnosis, plan and red flags that require return for reevaluation with PCP, Urgent care or EMERGENCY. Patient was agreeable to outlined plan and questions were answered, felt stable for discharge.    All pertinent previous records reviewed prior to and during visit.   Home [facility]   Electronically signed by: Jon Earnie Flank, NP 06/04/2024 10:53 AM  "

## 2024-06-09 ENCOUNTER — Ambulatory Visit: Admitting: Radiation Oncology

## 2024-06-10 ENCOUNTER — Ambulatory Visit
Admission: RE | Admit: 2024-06-10 | Discharge: 2024-06-10 | Disposition: A | Discharge status: Home / Self Care | Source: Ambulatory Visit | Attending: Radiation Oncology | Admitting: Radiation Oncology

## 2024-06-10 ENCOUNTER — Ambulatory Visit

## 2024-06-10 DIAGNOSIS — M47816 Spondylosis without myelopathy or radiculopathy, lumbar region: Secondary | ICD-10-CM | POA: Insufficient documentation

## 2024-06-10 DIAGNOSIS — Z510 Encounter for antineoplastic radiation therapy: Secondary | ICD-10-CM | POA: Diagnosis present

## 2024-06-10 DIAGNOSIS — M5136 Other intervertebral disc degeneration, lumbar region with discogenic back pain only: Secondary | ICD-10-CM

## 2024-06-11 ENCOUNTER — Ambulatory Visit

## 2024-06-12 ENCOUNTER — Ambulatory Visit

## 2024-06-13 ENCOUNTER — Ambulatory Visit

## 2024-06-16 ENCOUNTER — Ambulatory Visit

## 2024-06-17 DIAGNOSIS — Z510 Encounter for antineoplastic radiation therapy: Secondary | ICD-10-CM | POA: Diagnosis not present

## 2024-06-18 ENCOUNTER — Ambulatory Visit

## 2024-06-20 ENCOUNTER — Ambulatory Visit

## 2024-06-23 ENCOUNTER — Other Ambulatory Visit: Payer: Self-pay

## 2024-06-23 ENCOUNTER — Ambulatory Visit
Admission: RE | Admit: 2024-06-23 | Discharge: 2024-06-23 | Disposition: A | Source: Ambulatory Visit | Attending: Radiation Oncology | Admitting: Radiation Oncology

## 2024-06-23 DIAGNOSIS — M47817 Spondylosis without myelopathy or radiculopathy, lumbosacral region: Secondary | ICD-10-CM

## 2024-06-23 DIAGNOSIS — Z510 Encounter for antineoplastic radiation therapy: Secondary | ICD-10-CM | POA: Diagnosis not present

## 2024-06-23 LAB — RAD ONC ARIA SESSION SUMMARY
Course Elapsed Days: 0
Plan Fractions Treated to Date: 1
Plan Prescribed Dose Per Fraction: 0.5 Gy
Plan Total Fractions Prescribed: 6
Plan Total Prescribed Dose: 3 Gy
Reference Point Dosage Given to Date: 0.5 Gy
Reference Point Session Dosage Given: 0.5 Gy
Session Number: 1

## 2024-06-25 ENCOUNTER — Ambulatory Visit
Admission: RE | Admit: 2024-06-25 | Discharge: 2024-06-25 | Disposition: A | Source: Ambulatory Visit | Attending: Radiation Oncology | Admitting: Radiation Oncology

## 2024-06-25 ENCOUNTER — Other Ambulatory Visit: Payer: Self-pay

## 2024-06-25 DIAGNOSIS — Z510 Encounter for antineoplastic radiation therapy: Secondary | ICD-10-CM | POA: Diagnosis not present

## 2024-06-25 LAB — RAD ONC ARIA SESSION SUMMARY
Course Elapsed Days: 2
Plan Fractions Treated to Date: 2
Plan Prescribed Dose Per Fraction: 0.5 Gy
Plan Total Fractions Prescribed: 6
Plan Total Prescribed Dose: 3 Gy
Reference Point Dosage Given to Date: 1 Gy
Reference Point Session Dosage Given: 0.5 Gy
Session Number: 2

## 2024-06-27 ENCOUNTER — Other Ambulatory Visit: Payer: Self-pay

## 2024-06-27 ENCOUNTER — Ambulatory Visit
Admission: RE | Admit: 2024-06-27 | Discharge: 2024-06-27 | Disposition: A | Source: Ambulatory Visit | Attending: Radiation Oncology | Admitting: Radiation Oncology

## 2024-06-27 DIAGNOSIS — Z510 Encounter for antineoplastic radiation therapy: Secondary | ICD-10-CM | POA: Diagnosis not present

## 2024-06-27 LAB — RAD ONC ARIA SESSION SUMMARY
Course Elapsed Days: 4
Plan Fractions Treated to Date: 3
Plan Prescribed Dose Per Fraction: 0.5 Gy
Plan Total Fractions Prescribed: 6
Plan Total Prescribed Dose: 3 Gy
Reference Point Dosage Given to Date: 1.5 Gy
Reference Point Session Dosage Given: 0.5 Gy
Session Number: 3

## 2024-06-30 ENCOUNTER — Ambulatory Visit

## 2024-06-30 ENCOUNTER — Inpatient Hospital Stay: Admitting: Psychiatry

## 2024-06-30 DIAGNOSIS — C519 Malignant neoplasm of vulva, unspecified: Secondary | ICD-10-CM

## 2024-07-01 ENCOUNTER — Encounter: Payer: Self-pay | Admitting: Psychiatry

## 2024-07-01 ENCOUNTER — Inpatient Hospital Stay: Attending: Psychiatry | Admitting: Psychiatry

## 2024-07-01 VITALS — BP 150/76 | HR 65 | Temp 97.9°F | Resp 16 | Wt 195.0 lb

## 2024-07-01 DIAGNOSIS — Z9079 Acquired absence of other genital organ(s): Secondary | ICD-10-CM | POA: Diagnosis not present

## 2024-07-01 DIAGNOSIS — Z8544 Personal history of malignant neoplasm of other female genital organs: Secondary | ICD-10-CM | POA: Diagnosis not present

## 2024-07-01 DIAGNOSIS — N904 Leukoplakia of vulva: Secondary | ICD-10-CM | POA: Diagnosis not present

## 2024-07-01 DIAGNOSIS — Z86002 Personal history of in-situ neoplasm of other and unspecified genital organs: Secondary | ICD-10-CM | POA: Diagnosis present

## 2024-07-01 DIAGNOSIS — C519 Malignant neoplasm of vulva, unspecified: Secondary | ICD-10-CM

## 2024-07-01 NOTE — Patient Instructions (Signed)
 It was a pleasure to see you in clinic today. - Normal exam today - Let us  know if any issue with itching that doesn't go away with the cream or new bumps or bleeding. - Return visit planned for 6 mo  Thank you very much for allowing me to provide care for you today.  I appreciate your confidence in choosing our Gynecologic Oncology team at [facility].  If you have any questions about your visit today please call our office or send us  a MyChart message and we will get back to you as soon as possible.

## 2024-07-01 NOTE — Progress Notes (Signed)
 Gynecologic Oncology Return Clinic Visit  Date of Service: 07/01/2024 Referring Provider:  Ricka Ruffini, MD [facility] - Dermatology [place]  Assessment & Plan: Cassandra Rios is a 74 y.o. 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  Vulvar cancer: - Given microscopic nature and superficial invasion, no adjuvant treatment indicated.  Negative margins for malignancy and dysplasia/dVIN. - NED on exam today. 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  - Signs/symptoms of recurrence reviewed. - Reviewed signs/symptoms of recurrence.  - Surveillance reviewed. Follow-up q6 months x2 years then annually.  - Reviewed that after 5 years NED, will be safe to return to Ob/Gyn.  Vulvar itching: - Biopsy with lichen sclerosis last visit - Continue triamcinolone cream - No areas of concern on exam today   RTC 32mo   [name], MD Gynecologic Oncology  Medical Decision Making I personally spent  TOTAL 20 minutes face-to-face and non-face-to-face in the care of this patient, which includes all pre, intra, and post visit time on the date of service.   ----------------------- Reason for Visit: Follow-up  Treatment History: Oncology History  Vulvar cancer (HCC)   Initial Diagnosis   Was being followed for lichen sclerosis   12/26/2022 Initial Biopsy   A. VULVA, BIOPSY:               High-grade squamous intraepithelial lesion (VIN-3) Comment    Immunohistochemistry was performed on block A1. The p16 shows block-type positivity. The p53 stain is wild type pattern.      02/27/2023 Surgery   Simple partial vulvectomy   02/27/2023 Initial Diagnosis   Vulvar cancer (HCC)   02/27/2023 Pathology Results   A. VULVA, POSTERIOR, PARTIAL VULVECTOMY:      Superficially invasive squamous cell carcinoma arising in high-grade squamous  intraepithelial lesion (VIN-3).      Tumor size: 1 mm (microscopically).      Depth of invasion: 1 mm.      Surgical margins of resection are negative for dysplasia or invasive carcinoma.      See oncology table.  ONCOLOGY TABLE:  VULVA, CARCINOMA: Resection  Procedure: Partial vulvectomy Tumor Focality: Unifocal Tumor Site: Posterior vulva Tumor Size: 1 mm (microscopically) Histologic Type: Invasive squamous cell carcinoma Histologic Grade: G1, well-differentiated Depth of Invasion:      Specify depth of invasion (mm): 1.0 mm Other Tissue/ Organ Involvement: Not applicable Lymphovascular Invasion: Not identified Margins:      Margins Involved by Invasive Carcinoma: All margins negative for invasive carcinoma      Margin Status for HSIL or dVIN: Negative Regional Lymph Nodes: Not applicable (no lymph nodes submitted or found)           Lymph Nodes Examined:                                   [0] Sentinel                                   [0] Non-sentinel                                   [0] Total  Number of Nodes with Metastasis 5 mm or Greater: NA           Number of Nodes with Metastasis Less than 5 mm (excludes isolated tumor cells): NA           Number of Nodes with Isolated Tumor Cells (0.2 mm or less): NA           Additional Lymph Node Findings: None identified Distant Metastasis:      Distant Site(s) Involved: Not applicable Pathologic Stage Classification (pTNM, AJCC 8th Edition): pT1a, pNx Ancillary Studies: Can be performed upon request Representative Tumor Block: A1 Comment(s): Immunohistochemical stain for p16 will be performed and reported in an addendum    02/27/2023 Cancer Staging   Staging form: Vulva, AJCC V9 - Clinical stage from 02/27/2023: FIGO Stage IA (cT1a, cNX, cM0) - Signed by Eldonna Mays, MD on 03/19/2023 Histopathologic type: Squamous cell carcinoma, NOS Stage prefix: Initial diagnosis Histologic grade (G): G1 Histologic grading  system: 3 grade system     Interval History: Pt was referred to Radiation oncology for consideration of RT for arthritis. She has been undergoing treatment and feels that it is helping.  Otherwise reports she is doing well. Has been using triamcinolone cream daily. Haven't been much itching. No bleeding or new lesions.     Past Medical/Surgical History: Past Medical History:  Diagnosis Date   AAA (abdominal aortic aneurysm)    Allergic rhinitis    Anxiety    Arthritis    back   Chronic hoarseness    Colon polyps    COPD (chronic obstructive pulmonary disease) (HCC)    Dr. Geronimo   Coronary artery disease    nonobstructive   Dyslipidemia    Gastric bleed 10/2021   stomach ulcers   Gastritis    GERD (gastroesophageal reflux disease)    History of hiatal hernia    Hyperlipidemia    Hypertension    Hypothyroidism    MS (multiple sclerosis)    Neuromuscular disorder (HCC)    MS   Obesity    RBBB    Stomach ulcer from aspirin /ibuprofen-like drugs (NSAID's)     Past Surgical History:  Procedure Laterality Date   ABDOMINAL AORTIC ENDOVASCULAR STENT GRAFT N/A 10/31/2022   Procedure: ABDOMINAL AORTIC ENDOVASCULAR STENT GRAFT;  Surgeon: Sheree Penne Bruckner, MD;  Location: [facility] OR;  Service: Vascular;  Laterality: N/A;   ABDOMINAL HYSTERECTOMY     Endometriosis   BARIATRIC SURGERY  09/07/2008   lap band   CARPAL TUNNEL RELEASE Bilateral    CATARACT EXTRACTION Right 11/08/2017   CATARACT EXTRACTION Left 11/22/2017   CATARACT EXTRACTION, BILATERAL      Toric lenses for astigmatism   CHOLECYSTECTOMY     ESOPHAGOGASTRODUODENOSCOPY (EGD) WITH PROPOFOL  N/A 10/17/2021   Procedure: ESOPHAGOGASTRODUODENOSCOPY (EGD) WITH PROPOFOL ;  Surgeon: Burnette Fallow, MD;  Location: [facility] ENDOSCOPY;  Service: Gastroenterology;  Laterality: N/A;   HIATAL HERNIA REPAIR  09/07/2008   LEFT HEART CATH AND CORONARY ANGIOGRAPHY N/A 07/09/2019   Procedure: LEFT HEART CATH AND CORONARY  ANGIOGRAPHY;  Surgeon: Claudene Victory ORN, MD;  Location: [facility] INVASIVE CV LAB;  Service: Cardiovascular;  Laterality: N/A;   TONSILLECTOMY     ULTRASOUND GUIDANCE FOR VASCULAR ACCESS Bilateral 10/31/2022   Procedure: ULTRASOUND GUIDANCE FOR VASCULAR ACCESS;  Surgeon: Sheree Penne Bruckner, MD;  Location: [facility] OR;  Service: Vascular;  Laterality: Bilateral;   VULVECTOMY PARTIAL N/A 02/27/2023   Procedure: VULVECTOMY PARTIAL;  Surgeon: Eldonna Mays, MD;  Location: [facility] ORS;  Service: Gynecology;  Laterality: N/A;    Family History  Problem Relation Age of Onset   Stroke Mother 105   Heart disease Mother        congenital heart defect?   COPD Father    Heart disease Father    AAA (abdominal aortic aneurysm) Father    COPD Sister    Arthritis Sister    AAA (abdominal aortic aneurysm) Sister    Dementia Sister    Other Sister        brain injury   Multiple sclerosis Neg Hx    Breast cancer Neg Hx    Ovarian cancer Neg Hx    Endometrial cancer Neg Hx    Colon cancer Neg Hx     Social History   Socioeconomic History   Marital status: Widowed    Spouse name: Ozell   Number of children: 2   Years of education: 16   Highest education level: Not on file  Occupational History   Occupation: retired  Tobacco Use   Smoking status: Former    Current packs/day: 0.00    Average packs/day: 2.0 packs/day for 40.0 years (80.0 ttl pk-yrs)    Types: Cigarettes    Start date: 04/05/1967    Quit date: 04/05/2007    Years since quitting: 17.2    Passive exposure: Current (Husband)   Smokeless tobacco: Never  Vaping Use   Vaping status: Not on file  Substance and Sexual Activity   Alcohol  use: No   Drug use: No   Sexual activity: Not Currently    Partners: Male  Other Topics Concern   Not on file  Social History Narrative   Patient is right handed,resides alone (son is in and out)   Right handed   Social Drivers of Health   Tobacco Use: Medium Risk (06/04/2024)   Received from  [facility]   Patient History    Smoking Tobacco Use: Former    Smokeless Tobacco Use: Never    Passive Exposure: Not on file  Financial Resource Strain: Not on file  Food Insecurity: Medium Risk (06/06/2024)   Received from [facility]   Epic    Within the past 12 months, you worried that your food would run out before you got money to buy more: Never true    Within the past 12 months, the food you bought just didn't last and you didn't have money to get more. : Sometimes true  Transportation Needs: No Transportation Needs (06/06/2024)   Received from [employer]    In the past 12 months, has lack of reliable transportation kept you from medical appointments, meetings, work or from getting things needed for daily living? : No  Physical Activity: Inactive (05/19/2024)   Exercise Vital Sign    Days of Exercise per Week: 0 days    Minutes of Exercise per Session: 0 min  Stress: Stress Concern Present (05/19/2024)   [employer] of Occupational Health - Occupational Stress Questionnaire    Feeling of Stress: To some extent  Social Connections: Socially Isolated (05/19/2024)   Social Connection and Isolation Panel    Frequency of Communication with Friends and Family: Three times a week    Frequency of Social Gatherings with Friends and Family: Twice a week    Attends Religious Services: Never    Database Administrator or Organizations: No    Attends Banker Meetings: Never    Marital Status: Widowed  Depression (PHQ2-9): Medium Risk (05/19/2024)  Depression (PHQ2-9)    PHQ-2 Score: 5  Alcohol  Screen: Not on file  Housing: Medium Risk (06/06/2024)   Received from [facility]   Epic    What is your living situation today?: I have a steady place to live    Think about the place you live. Do you have problems with any of the following? Choose all that apply:: Mold  Utilities: Low Risk (06/06/2024)   Received from [facility]   Utilities    In  the past 12 months has the electric, gas, oil, or water company threatened to shut off services in your home? : No  Health Literacy: Not on file    Current Medications:  Current Outpatient Medications:    acetaminophen  (TYLENOL ) 500 MG tablet, Take 500-1,000 mg by mouth every 6 (six) hours as needed for moderate pain or headache., Disp: , Rfl:    atorvastatin  (LIPITOR ) 80 MG tablet, TAKE 1 TABLET BY MOUTH EVERY DAY, Disp: 90 tablet, Rfl: 1   clotrimazole  (LOTRIMIN ) 1 % cream, Apply 1 Application topically 2 (two) times daily., Disp: 30 g, Rfl: 3   FLUoxetine  (PROZAC ) 40 MG capsule, Take 1 capsule by mouth daily., Disp: , Rfl:    fluticasone  (FLONASE ) 50 MCG/ACT nasal spray, Place 1 spray into the nose., Disp: , Rfl:    gabapentin  (NEURONTIN ) 300 MG capsule, Take 2 capsules (600 mg total) by mouth every evening., Disp: 180 capsule, Rfl: 3   hydrochlorothiazide  (HYDRODIURIL ) 25 MG tablet, Take 1 tablet (25 mg total) by mouth daily., Disp: 90 tablet, Rfl: 1   HYDROcodone -acetaminophen  (NORCO/VICODIN) 5-325 MG tablet, Take 0.5 tablets by mouth every 6 (six) hours as needed for severe pain., Disp: , Rfl:    Interferon Beta-1b  (BETASERON /EXTAVIA ) 0.3 MG KIT injection, Inject 0.25 mg into the skin every other day., Disp: 42 each, Rfl: 3   Levothyroxine  Sodium (TIROSINT ) 112 MCG CAPS, Take 1 capsule by mouth., Disp: , Rfl:    mometasone  (NASONEX ) 50 MCG/ACT nasal spray, Place 2 sprays into the nose daily as needed (allergies)., Disp: , Rfl:    nitroGLYCERIN  (NITROSTAT ) 0.4 MG SL tablet, Place 1 tablet (0.4 mg total) under the tongue every 5 (five) minutes as needed for chest pain., Disp: 25 tablet, Rfl: 4   Polyethyl Glycol-Propyl Glycol (SYSTANE ULTRA OP), Place 1 drop into the right eye daily as needed (dry eye). , Disp: , Rfl:    senna (SENOKOT) 8.6 MG tablet, Take 2 tablets by mouth at bedtime., Disp: , Rfl:    triamcinolone ointment (KENALOG) 0.5 %, As needed, Disp: , Rfl:   Review of  Symptoms: Complete 10-system review is positive for: Urinary frequency, headache, decreased concentration, back pain, dizziness, some leg swelling, problem walking  Physical Exam: BP (!) 152/49 (BP Location: Left Arm, Patient Position: Sitting)   Pulse 65   Temp 97.9 F (36.6 C) (Oral)   Resp 16   Wt 195 lb (88.5 kg)   SpO2 100%   BMI 31.47 kg/m  General: Alert, oriented, no acute distress. HEENT: Normocephalic, atraumatic. Neck symmetric without masses. Sclera anicteric.  Chest: Normal work of breathing. Clear to auscultation bilaterally.   Cardiovascular: Regular rate and rhythm, no murmurs. Abdomen: Soft, nontender.  Normoactive bowel sounds. Lab band port palpated in right upper abdomen. Extremities: Grossly normal range of motion.  Warm, well perfused.  No edema bilaterally. Skin: No rashes or lesions noted.   Erythema under pannus consistent with likely irritation and candidiasis. Lymphatics: No cervical, supraclavicular, or inguinal adenopathy. GU:  External genitalia without lesions, erythema or irritation.   Well-healed from prior vulvectomy at posterior fourchette.  On speculum exam, normal vaginal mucosa with atrophy, no lesions.  Bimanual exam with smooth vaginal mucosa, no nodularity or pelvic mass.  Exam chaperoned by Warren Fess, RN    Laboratory & Radiologic Studies: Surgical pathology (12/31/23): A. RIGHT LABIA MAJORA BIOPSY:  - Benign squamous epithelium with dermal sclerosis.  See comment.  - No atypia or malignancy identified   COMMENT:  The findings likely represent lichen sclerosis et atrophicus.  Special  stain PAS is pending for fungal organisms and will be reported in an  addendum.

## 2024-07-01 NOTE — Progress Notes (Signed)
 This encounter was created in error - please disregard.

## 2024-07-02 ENCOUNTER — Ambulatory Visit
Admission: RE | Admit: 2024-07-02 | Discharge: 2024-07-02 | Disposition: A | Source: Ambulatory Visit | Attending: Radiation Oncology | Admitting: Radiation Oncology

## 2024-07-02 ENCOUNTER — Other Ambulatory Visit: Payer: Self-pay

## 2024-07-02 ENCOUNTER — Ambulatory Visit

## 2024-07-02 DIAGNOSIS — M47816 Spondylosis without myelopathy or radiculopathy, lumbar region: Secondary | ICD-10-CM

## 2024-07-02 DIAGNOSIS — Z510 Encounter for antineoplastic radiation therapy: Secondary | ICD-10-CM | POA: Diagnosis not present

## 2024-07-02 LAB — RAD ONC ARIA SESSION SUMMARY
Course Elapsed Days: 9
Plan Fractions Treated to Date: 4
Plan Prescribed Dose Per Fraction: 0.5 Gy
Plan Total Fractions Prescribed: 6
Plan Total Prescribed Dose: 3 Gy
Reference Point Dosage Given to Date: 2 Gy
Reference Point Session Dosage Given: 0.5 Gy
Session Number: 4

## 2024-07-02 MED ORDER — SONAFINE EX EMUL
1.0000 | Freq: Once | CUTANEOUS | Status: AC
  Administered 2024-07-02: 1 via TOPICAL

## 2024-07-04 ENCOUNTER — Ambulatory Visit
Admission: RE | Admit: 2024-07-04 | Discharge: 2024-07-04 | Disposition: A | Source: Ambulatory Visit | Attending: Radiation Oncology | Admitting: Radiation Oncology

## 2024-07-04 ENCOUNTER — Other Ambulatory Visit: Payer: Self-pay

## 2024-07-04 ENCOUNTER — Ambulatory Visit

## 2024-07-04 LAB — RAD ONC ARIA SESSION SUMMARY
Course Elapsed Days: 11
Plan Fractions Treated to Date: 5
Plan Prescribed Dose Per Fraction: 0.5 Gy
Plan Total Fractions Prescribed: 6
Plan Total Prescribed Dose: 3 Gy
Reference Point Dosage Given to Date: 2.5 Gy
Reference Point Session Dosage Given: 0.5 Gy
Session Number: 5

## 2024-07-06 ENCOUNTER — Other Ambulatory Visit: Payer: Self-pay | Admitting: Family Medicine

## 2024-07-06 DIAGNOSIS — E039 Hypothyroidism, unspecified: Secondary | ICD-10-CM

## 2024-07-07 ENCOUNTER — Ambulatory Visit

## 2024-07-08 ENCOUNTER — Ambulatory Visit

## 2024-07-09 ENCOUNTER — Other Ambulatory Visit: Payer: Self-pay

## 2024-07-09 ENCOUNTER — Ambulatory Visit
Admission: RE | Admit: 2024-07-09 | Discharge: 2024-07-09 | Attending: Radiation Oncology | Admitting: Radiation Oncology

## 2024-07-09 LAB — RAD ONC ARIA SESSION SUMMARY
Course Elapsed Days: 16
Plan Fractions Treated to Date: 6
Plan Prescribed Dose Per Fraction: 0.5 Gy
Plan Total Fractions Prescribed: 6
Plan Total Prescribed Dose: 3 Gy
Reference Point Dosage Given to Date: 3 Gy
Reference Point Session Dosage Given: 0.5 Gy
Session Number: 6

## 2024-07-10 NOTE — Radiation Completion Notes (Signed)
 Patient Name: Cassandra Rios, Cassandra Rios MRN: [id number] Date of Birth: [birth year] Referring Physician: CHARLIE DOLORES, M.D. Date of Service: 2024-07-10 Radiation Oncologist: Signe Nasuti, M.D. 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, lumbar region Staging on 2023-02-27: Vulvar cancer (HCC) T=cT1a, N=cNX, M=cM0 Intent: Curative     ==========DELIVERED PLANS==========  First Treatment Date: 2024-06-23 Last Treatment Date: 2024-07-09   Plan Name: Spine_L Site: Lumbar Spine Technique: Isodose Plan Mode: Photon Dose Per Fraction: 0.5 Gy Prescribed Dose (Delivered / Prescribed): 3 Gy / 3 Gy Prescribed Fxs (Delivered / Prescribed): 6 / 6     ==========ON TREATMENT VISIT DATES========== 2024-07-02     ==========UPCOMING VISITS========== 08/05/2024 [facility] ONC FOLLOW UP [address] [place], [place]  07/24/2024 [facility] VISIT [facility]        ==========APPENDIX - ON TREATMENT VISIT NOTES==========   See weekly On Treatment Notes in Epic for details in the Media tab (listed as Progress notes on the On Treatment Visit Dates listed above).

## 2024-07-24 ENCOUNTER — Other Ambulatory Visit

## 2024-08-05 ENCOUNTER — Ambulatory Visit: Admitting: Radiology

## 2024-11-21 ENCOUNTER — Ambulatory Visit: Admitting: Family Medicine

## 2024-12-08 ENCOUNTER — Ambulatory Visit: Admitting: Adult Health

## 2024-12-15 ENCOUNTER — Inpatient Hospital Stay: Admitting: Psychiatry

## 2025-05-21 ENCOUNTER — Ambulatory Visit
